# Patient Record
Sex: Male | Born: 1935 | Race: White | Hispanic: No | Smoking: Former smoker
Health system: Southern US, Community
[De-identification: ages and names within clinical notes are randomized; demographics above are authoritative.]

## PROBLEM LIST (undated history)

## (undated) DIAGNOSIS — W19XXXA Unspecified fall, initial encounter: Secondary | ICD-10-CM

## (undated) DIAGNOSIS — R0602 Shortness of breath: Secondary | ICD-10-CM

## (undated) DIAGNOSIS — E039 Hypothyroidism, unspecified: Secondary | ICD-10-CM

## (undated) DIAGNOSIS — I509 Heart failure, unspecified: Secondary | ICD-10-CM

## (undated) DIAGNOSIS — J449 Chronic obstructive pulmonary disease, unspecified: Secondary | ICD-10-CM

## (undated) DIAGNOSIS — J189 Pneumonia, unspecified organism: Secondary | ICD-10-CM

## (undated) DIAGNOSIS — I351 Nonrheumatic aortic (valve) insufficiency: Secondary | ICD-10-CM

## (undated) DIAGNOSIS — R6889 Other general symptoms and signs: Secondary | ICD-10-CM

## (undated) DIAGNOSIS — I4891 Unspecified atrial fibrillation: Secondary | ICD-10-CM

## (undated) DIAGNOSIS — I10 Essential (primary) hypertension: Secondary | ICD-10-CM

## (undated) DIAGNOSIS — R627 Adult failure to thrive: Secondary | ICD-10-CM

## (undated) DIAGNOSIS — F0390 Unspecified dementia without behavioral disturbance: Secondary | ICD-10-CM

## (undated) DIAGNOSIS — C189 Malignant neoplasm of colon, unspecified: Secondary | ICD-10-CM

## (undated) DIAGNOSIS — E785 Hyperlipidemia, unspecified: Secondary | ICD-10-CM

## (undated) DIAGNOSIS — I493 Ventricular premature depolarization: Secondary | ICD-10-CM

## (undated) HISTORY — DX: Nonrheumatic aortic (valve) insufficiency: I35.1

## (undated) HISTORY — DX: Ventricular premature depolarization: I49.3

## (undated) HISTORY — DX: Heart failure, unspecified: I50.9

## (undated) HISTORY — DX: Other general symptoms and signs: R68.89

## (undated) HISTORY — PX: COLON SURGERY: SHX602

---

## 1998-06-16 ENCOUNTER — Emergency Department (HOSPITAL_COMMUNITY): Admission: EM | Admit: 1998-06-16 | Discharge: 1998-06-16 | Payer: Self-pay | Admitting: Emergency Medicine

## 2001-05-08 ENCOUNTER — Other Ambulatory Visit: Admission: RE | Admit: 2001-05-08 | Discharge: 2001-05-08 | Payer: Self-pay | Admitting: Gastroenterology

## 2001-05-08 ENCOUNTER — Encounter (INDEPENDENT_AMBULATORY_CARE_PROVIDER_SITE_OTHER): Payer: Self-pay | Admitting: Specialist

## 2001-08-29 ENCOUNTER — Encounter: Payer: Self-pay | Admitting: Family Medicine

## 2001-08-29 ENCOUNTER — Encounter: Admission: RE | Admit: 2001-08-29 | Discharge: 2001-08-29 | Payer: Self-pay | Admitting: Family Medicine

## 2006-12-14 ENCOUNTER — Ambulatory Visit: Payer: Self-pay | Admitting: Thoracic Surgery

## 2006-12-20 ENCOUNTER — Ambulatory Visit (HOSPITAL_COMMUNITY): Admission: RE | Admit: 2006-12-20 | Discharge: 2006-12-20 | Payer: Self-pay | Admitting: Thoracic Surgery

## 2006-12-27 ENCOUNTER — Ambulatory Visit (HOSPITAL_COMMUNITY): Admission: RE | Admit: 2006-12-27 | Discharge: 2006-12-27 | Payer: Self-pay | Admitting: Thoracic Surgery

## 2007-01-03 ENCOUNTER — Ambulatory Visit: Payer: Self-pay | Admitting: Thoracic Surgery

## 2007-03-22 ENCOUNTER — Encounter: Admission: RE | Admit: 2007-03-22 | Discharge: 2007-03-22 | Payer: Self-pay | Admitting: Thoracic Surgery

## 2007-03-22 ENCOUNTER — Ambulatory Visit: Payer: Self-pay | Admitting: Thoracic Surgery

## 2007-03-31 ENCOUNTER — Ambulatory Visit: Payer: Self-pay | Admitting: Thoracic Surgery

## 2007-03-31 ENCOUNTER — Encounter: Payer: Self-pay | Admitting: Thoracic Surgery

## 2007-03-31 ENCOUNTER — Ambulatory Visit (HOSPITAL_COMMUNITY): Admission: RE | Admit: 2007-03-31 | Discharge: 2007-03-31 | Payer: Self-pay | Admitting: Thoracic Surgery

## 2007-04-04 ENCOUNTER — Ambulatory Visit: Payer: Self-pay | Admitting: Thoracic Surgery

## 2007-08-30 ENCOUNTER — Encounter: Admission: RE | Admit: 2007-08-30 | Discharge: 2007-08-30 | Payer: Self-pay | Admitting: Thoracic Surgery

## 2007-08-30 ENCOUNTER — Ambulatory Visit: Payer: Self-pay | Admitting: Thoracic Surgery

## 2007-12-05 ENCOUNTER — Ambulatory Visit: Payer: Self-pay | Admitting: Thoracic Surgery

## 2007-12-05 ENCOUNTER — Encounter: Admission: RE | Admit: 2007-12-05 | Discharge: 2007-12-05 | Payer: Self-pay | Admitting: Thoracic Surgery

## 2008-01-02 ENCOUNTER — Encounter: Admission: RE | Admit: 2008-01-02 | Discharge: 2008-01-02 | Payer: Self-pay | Admitting: Thoracic Surgery

## 2008-01-02 ENCOUNTER — Ambulatory Visit: Payer: Self-pay | Admitting: Thoracic Surgery

## 2008-01-08 ENCOUNTER — Ambulatory Visit (HOSPITAL_COMMUNITY): Admission: RE | Admit: 2008-01-08 | Discharge: 2008-01-08 | Payer: Self-pay | Admitting: Thoracic Surgery

## 2008-01-08 ENCOUNTER — Encounter: Payer: Self-pay | Admitting: Thoracic Surgery

## 2008-01-08 ENCOUNTER — Ambulatory Visit: Payer: Self-pay | Admitting: Thoracic Surgery

## 2008-01-10 ENCOUNTER — Ambulatory Visit: Payer: Self-pay | Admitting: Thoracic Surgery

## 2008-01-24 ENCOUNTER — Ambulatory Visit: Payer: Self-pay | Admitting: Pulmonary Disease

## 2008-01-24 DIAGNOSIS — R930 Abnormal findings on diagnostic imaging of skull and head, not elsewhere classified: Secondary | ICD-10-CM

## 2008-01-24 DIAGNOSIS — J309 Allergic rhinitis, unspecified: Secondary | ICD-10-CM | POA: Insufficient documentation

## 2008-01-24 DIAGNOSIS — E785 Hyperlipidemia, unspecified: Secondary | ICD-10-CM

## 2008-01-24 DIAGNOSIS — I110 Hypertensive heart disease with heart failure: Secondary | ICD-10-CM

## 2008-01-24 DIAGNOSIS — J479 Bronchiectasis, uncomplicated: Secondary | ICD-10-CM

## 2008-01-24 DIAGNOSIS — J45909 Unspecified asthma, uncomplicated: Secondary | ICD-10-CM | POA: Insufficient documentation

## 2008-01-24 DIAGNOSIS — Z85038 Personal history of other malignant neoplasm of large intestine: Secondary | ICD-10-CM | POA: Insufficient documentation

## 2008-01-25 ENCOUNTER — Telehealth (INDEPENDENT_AMBULATORY_CARE_PROVIDER_SITE_OTHER): Payer: Self-pay | Admitting: *Deleted

## 2008-02-01 ENCOUNTER — Ambulatory Visit: Payer: Self-pay | Admitting: Thoracic Surgery

## 2008-02-01 ENCOUNTER — Encounter: Admission: RE | Admit: 2008-02-01 | Discharge: 2008-02-01 | Payer: Self-pay | Admitting: Thoracic Surgery

## 2008-02-15 ENCOUNTER — Ambulatory Visit: Payer: Self-pay | Admitting: Pulmonary Disease

## 2008-04-16 ENCOUNTER — Ambulatory Visit: Payer: Self-pay | Admitting: Pulmonary Disease

## 2008-06-17 ENCOUNTER — Ambulatory Visit: Payer: Self-pay | Admitting: Pulmonary Disease

## 2008-06-17 DIAGNOSIS — R609 Edema, unspecified: Secondary | ICD-10-CM

## 2009-05-12 ENCOUNTER — Encounter (INDEPENDENT_AMBULATORY_CARE_PROVIDER_SITE_OTHER): Payer: Self-pay | Admitting: *Deleted

## 2009-06-05 ENCOUNTER — Ambulatory Visit: Payer: Self-pay | Admitting: Gastroenterology

## 2009-06-16 ENCOUNTER — Ambulatory Visit: Payer: Self-pay | Admitting: Gastroenterology

## 2010-03-16 ENCOUNTER — Ambulatory Visit: Payer: Self-pay | Admitting: Pulmonary Disease

## 2010-03-16 DIAGNOSIS — J189 Pneumonia, unspecified organism: Secondary | ICD-10-CM

## 2010-07-24 ENCOUNTER — Ambulatory Visit: Payer: Self-pay | Admitting: Cardiology

## 2010-07-24 ENCOUNTER — Ambulatory Visit (HOSPITAL_COMMUNITY): Admission: RE | Admit: 2010-07-24 | Discharge: 2010-07-24 | Payer: Self-pay | Admitting: Cardiology

## 2010-07-29 ENCOUNTER — Ambulatory Visit: Payer: Self-pay | Admitting: Cardiology

## 2010-12-01 ENCOUNTER — Ambulatory Visit: Payer: Self-pay | Admitting: Cardiology

## 2010-12-01 NOTE — Assessment & Plan Note (Signed)
Summary: rov/apc   Visit Type:  Follow-up Primary Provider/Referring Provider:  Rudi Heap in [facility]  CC:  Asthma.  Bronchiectasis.  Last seen 06/17/2008.  The patient says he had pneumonia recently and brought CD of CXR done at an Urgent Care. He has no complaints today.Marland Kitchen  History of Present Illness: I saw Todd [name] in follow up for his asthma, bronchiectasis, and rhinitis.  He was treated for pneumonia at the beginning of the month with a Zpak.  He had cough, wheeze, and chest congestion.  He denied fever or hemoptysis.  His symptoms have since improved.  He has not needed to use his ventolin much.  Chest xray from Mar 08, 2010 showed Rt upper lung infiltrate.    Current Medications (verified): 1)  Advair Diskus 500-50 Mcg/dose  Misc (Fluticasone-Salmeterol) .... Inhale 1 Puff Two Times A Day 2)  Ventolin Hfa 108 (90 Base) Mcg/act Aers (Albuterol Sulfate) .... 2 Puffs Every 6 Hours As Needed 3)  Rhinocort Aqua 32 Mcg/act  Susp (Budesonide) .... At Bedtime 4)  Singulair 10 Mg  Tabs (Montelukast Sodium) .... One By Mouth At Bedtime 5)  Atacand Hct 32-12.5 Mg  Tabs (Candesartan Cilexetil-Hctz) .... Once Daily 6)  Pravastatin Sodium 20 Mg Tabs (Pravastatin Sodium) .Marland Kitchen.. 1 By Mouth Daily 7)  Norvasc 5 Mg  Tabs (Amlodipine Besylate) .... Take 1 Tablet By Mouth Once A Day 8)  Lunesta 2 Mg  Tabs (Eszopiclone) .... For Use At Bedtime As Needed 9)  Mucinex 600 Mg  Tb12 (Guaifenesin) .... One By Mouth Two Times A Day As Needed  Allergies (verified): No Known Drug Allergies  Past History:  Past Medical History: Allergic Rhinitis Asthma Bronchiectasis Colon Cancer Hyperlipidemia Hypertension  Past Surgical History: Reviewed history from 01/24/2008 and no changes required. Colonoscopy  Vital Signs:  Patient profile:   77 year old male Height:      69 inches (175.26 cm) Weight:      260 pounds (118.18 kg) BMI:     38.53 O2 Sat:      96 % on Room air Temp:     97.9 degrees  F (36.61 degrees C) oral Pulse rate:   59 / minute BP sitting:   130 / 72  (left arm) Cuff size:   regular  Vitals Entered By: Michel Bickers CMA (Mar 16, 2010 11:56 AM)  O2 Sat at Rest %:  98 O2 Flow:  Room air CC: Asthma.  Bronchiectasis.  Last seen 06/17/2008.  The patient says he had pneumonia recently and brought CD of CXR done at an Urgent Care. He has no complaints today.   Physical Exam  General:  healthy appearing and thin.   Nose:  no sinus tenderness, clear discharge Mouth:  no oral lesion Neck:  no JVD.   Lungs:  decreased breath sounds, no wheezing, prolonged exhalation Heart:  regular rhythm and normal rate.   Extremities:  minimal ankle edema Cervical Nodes:  no significant adenopathy   Impression & Recommendations:  Problem # 1:  PNEUMONIA, ORGANISM UNSPECIFIED (ICD-486) He was recently treated for pneumonia.  He has improved clinically.  I will repeat his chest xray today.  If his infiltrate has cleared, then he can follow up with pulmonary as needed.  Problem # 2:  ASTHMA (ICD-493.90) This is stable.  He would like to have a nebulizer for emergencies.  Will arrange for him to get a home nebulizer.  Problem # 3:  BRONCHIECTASIS (ICD-494.0) This is stable.  Problem # 4:  ALLERGIC RHINITIS (ICD-477.9) This is stable.  Medications Added to Medication List This Visit: 1)  Ventolin Hfa 108 (90 Base) Mcg/act Aers (Albuterol sulfate) .... 2 puffs every 6 hours as needed 2)  Pravastatin Sodium 20 Mg Tabs (Pravastatin sodium) .Marland Kitchen.. 1 by mouth daily 3)  Albuterol Sulfate (2.5 Mg/30ml) 0.083% Nebu (Albuterol sulfate) .... One vial nebulized up to four times per day as needed  Complete Medication List: 1)  Advair Diskus 500-50 Mcg/dose Misc (Fluticasone-salmeterol) .... Inhale 1 puff two times a day 2)  Ventolin Hfa 108 (90 Base) Mcg/act Aers (Albuterol sulfate) .... 2 puffs every 6 hours as needed 3)  Rhinocort Aqua 32 Mcg/act Susp (Budesonide) .... At bedtime 4)   Singulair 10 Mg Tabs (Montelukast sodium) .... One by mouth at bedtime 5)  Atacand Hct 32-12.5 Mg Tabs (Candesartan cilexetil-hctz) .... Once daily 6)  Pravastatin Sodium 20 Mg Tabs (Pravastatin sodium) .Marland Kitchen.. 1 by mouth daily 7)  Norvasc 5 Mg Tabs (Amlodipine besylate) .... Take 1 tablet by mouth once a day 8)  Lunesta 2 Mg Tabs (Eszopiclone) .... For use at bedtime as needed 9)  Mucinex 600 Mg Tb12 (Guaifenesin) .... One by mouth two times a day as needed 10)  Albuterol Sulfate (2.5 Mg/10ml) 0.083% Nebu (Albuterol sulfate) .... One vial nebulized up to four times per day as needed  Other Orders: Est. Patient Level III (16109) T-2 View CXR (71020TC) DME Referral (DME) Prescription Created Electronically [phone])  Patient Instructions: 1)  Chest xray today 2)  Will set up nebulizer at home 3)  Follow up as needed   Prescriptions: ALBUTEROL SULFATE (2.5 MG/3ML) 0.083% NEBU (ALBUTEROL SULFATE) one vial nebulized up to four times per day as needed  #120 x 3   Entered and Authorized by:   Coralyn Helling MD   Signed by:   Coralyn Helling MD on 03/16/2010   Method used:   Electronically to        [facility]  [address] [phone][facility] (retail)       [address]       [place], [place]  [address]       Ph: [phone]       Fax: [phone]   RxID:   [id number]    Immunization History:  Influenza Immunization History:    Influenza:  historical (07/02/2009)  Pneumovax Immunization History:    Pneumovax:  historical (11/01/2006)

## 2011-01-20 ENCOUNTER — Emergency Department (HOSPITAL_COMMUNITY): Payer: Medicare Other

## 2011-01-20 ENCOUNTER — Inpatient Hospital Stay (HOSPITAL_COMMUNITY)
Admission: EM | Admit: 2011-01-20 | Discharge: 2011-01-23 | DRG: 194 | Disposition: A | Discharge status: Home Health | Payer: Medicare Other | Attending: Internal Medicine | Admitting: Internal Medicine

## 2011-01-20 DIAGNOSIS — D72829 Elevated white blood cell count, unspecified: Secondary | ICD-10-CM | POA: Diagnosis not present

## 2011-01-20 DIAGNOSIS — I10 Essential (primary) hypertension: Secondary | ICD-10-CM | POA: Diagnosis present

## 2011-01-20 DIAGNOSIS — J45909 Unspecified asthma, uncomplicated: Secondary | ICD-10-CM | POA: Diagnosis present

## 2011-01-20 DIAGNOSIS — Z85038 Personal history of other malignant neoplasm of large intestine: Secondary | ICD-10-CM

## 2011-01-20 DIAGNOSIS — I4891 Unspecified atrial fibrillation: Secondary | ICD-10-CM | POA: Diagnosis present

## 2011-01-20 DIAGNOSIS — D649 Anemia, unspecified: Secondary | ICD-10-CM | POA: Diagnosis present

## 2011-01-20 DIAGNOSIS — M810 Age-related osteoporosis without current pathological fracture: Secondary | ICD-10-CM | POA: Diagnosis present

## 2011-01-20 DIAGNOSIS — E119 Type 2 diabetes mellitus without complications: Secondary | ICD-10-CM | POA: Diagnosis present

## 2011-01-20 DIAGNOSIS — I5030 Unspecified diastolic (congestive) heart failure: Secondary | ICD-10-CM | POA: Diagnosis present

## 2011-01-20 DIAGNOSIS — T380X5A Adverse effect of glucocorticoids and synthetic analogues, initial encounter: Secondary | ICD-10-CM | POA: Diagnosis not present

## 2011-01-20 DIAGNOSIS — N289 Disorder of kidney and ureter, unspecified: Secondary | ICD-10-CM | POA: Diagnosis present

## 2011-01-20 DIAGNOSIS — J189 Pneumonia, unspecified organism: Principal | ICD-10-CM | POA: Diagnosis present

## 2011-01-20 DIAGNOSIS — I509 Heart failure, unspecified: Secondary | ICD-10-CM | POA: Diagnosis present

## 2011-01-20 DIAGNOSIS — E785 Hyperlipidemia, unspecified: Secondary | ICD-10-CM | POA: Diagnosis present

## 2011-01-20 LAB — POCT CARDIAC MARKERS
CKMB, poc: 2.3 ng/mL (ref 1.0–8.0)
Myoglobin, poc: 306 ng/mL (ref 12–200)
Troponin i, poc: <0.05 ng/mL (ref 0.00–0.09)

## 2011-01-20 LAB — CK TOTAL AND CKMB (NOT AT ARMC): CK, MB: 4.2 ng/mL — ABNORMAL HIGH (ref 0.3–4.0)

## 2011-01-20 LAB — BLOOD GAS, ARTERIAL
Acid-Base Excess: 0.9 mmol/L (ref 0.0–2.0)
Drawn by: 30996
O2 Content: 2 L/min
O2 Saturation: 96.5 %
Patient temperature: 98.6
TCO2: 23.1 mmol/L (ref 0–100)
pCO2 arterial: 36.3 mmHg (ref 35.0–45.0)

## 2011-01-20 LAB — DIFFERENTIAL
Basophils Absolute: 0.1 10*3/uL (ref 0.0–0.1)
Basophils Relative: 1 % (ref 0–1)
Eosinophils Relative: 4 % (ref 0–5)
Lymphocytes Relative: 10 % — ABNORMAL LOW (ref 12–46)
Monocytes Absolute: 1.4 10*3/uL — ABNORMAL HIGH (ref 0.1–1.0)
Neutro Abs: 5.7 10*3/uL (ref 1.7–7.7)

## 2011-01-20 LAB — CBC
HCT: 26.3 % — ABNORMAL LOW (ref 39.0–52.0)
MCHC: 32.3 g/dL (ref 30.0–36.0)
RDW: 13.9 % (ref 11.5–15.5)
WBC: 8.3 10*3/uL (ref 4.0–10.5)

## 2011-01-20 LAB — TSH: TSH: 2.44 u[IU]/mL (ref 0.350–4.500)

## 2011-01-20 LAB — GLUCOSE, CAPILLARY: Glucose-Capillary: 96 mg/dL (ref 70–99)

## 2011-01-20 LAB — BASIC METABOLIC PANEL
Calcium: 8.6 mg/dL (ref 8.4–10.5)
GFR calc Af Amer: >60 mL/min (ref >60)
GFR calc non Af Amer: >60 mL/min (ref >60)
Glucose, Bld: 100 mg/dL — ABNORMAL HIGH (ref 70–99)
Potassium: 3.7 mEq/L (ref 3.5–5.1)
Sodium: 134 mEq/L — ABNORMAL LOW (ref 135–145)

## 2011-01-20 LAB — T3 UPTAKE: T3 Uptake Ratio: 51 % — ABNORMAL HIGH (ref 22.5–37.0)

## 2011-01-20 LAB — TROPONIN I: Troponin I: 0.02 ng/mL (ref 0.00–0.06)

## 2011-01-20 LAB — BRAIN NATRIURETIC PEPTIDE: Pro B Natriuretic peptide (BNP): 232 pg/mL — ABNORMAL HIGH (ref 0.0–100.0)

## 2011-01-20 NOTE — H&P (Signed)
NAME:  TAURUS, ALAMO NO.:  [id number]  MEDICAL RECORD NO.:  [id number]           PATIENT TYPE:  E  LOCATION:  WLED                         FACILITY[facility]  PHYSICIAN:  Talmage Nap, MD  DATE OF BIRTH:  [birth year]  DATE OF ADMISSION:  01/20/2011 DATE OF DISCHARGE:                             HISTORY & PHYSICAL   PRIMARY CARE PHYSICIAN:  Newman Nip, M.D., family medicine.  History obtainable from patient, patient's spouse and son.  CHIEF COMPLAINT:  Shortness of breath, poor appetite, difficulty in ambulation of about 2 weeks duration.  HISTORY OF PRESENT ILLNESS:  Patient is an 77 year old Caucasian male with history of asthma; atrial fibrillation on Pradaxa and hypertension as well as diabetes mellitus, being managed on diet, was initially seen by his primary care physician about 2 weeks ago with complaints of shortness of breath and subsequently treated for pneumonia with Levaquin for about 10 days; however, patient was said to be getting progressively worse and this was said to have been evident by a follow up chest x-ray-no improvement, patient was also said to be getting progressively lethargic, not able to eat and generalized weakness.  Patient was  said to be very sleepy and was also complaining of increasing shortness of breath.  He had swelling of the lower extremity before and this swelling of the lower extremity was getting worse. There was no history of cough.  There was no history of chest pain. There was no nausea or vomiting.  He had subjective feeling of fever.No pnd or orthopnea Patient symptoms were said to be getting progressively worse hence he was brought to the emergency room to be evaluated.  PAST MEDICAL HISTORY:  Positive for: 1. Recently treated pneumonia. 2. Asthma. 3. Hyperlipidemia. 4. Hypertension. 5. History of colon CA. 6. Hearing impaired. 7. BPH. 8. Osteoporosis. 9. History of colon  polyps. 10.Non-insulin-dependent diabetes mellitus. 11.History of lung nodules.  PAST SURGICAL HISTORY: 1. Colonoscopy 5 years ago-findings unknown 2. History of colon CA, status post exploratory laparotomy with     resection.  PREADMISSION MEDICATIONS:  Include: 1. Atacand HCT 2/12.5 one p.o. daily. 2. Allegra 600 mg one p.o. b.i.d. 3. Advair Diskus 500/50 one puff b.i.d. 4. Albuterol MDI on a p.r.n. basis. 5. Calcium 1200 mg plus D one p.o. daily. 6. Centrum Silver one p.o. daily. 7. Rhinocort aqua one daily. 8. Aspirin 81 mg p.o. daily. 9. Norvasc 5 mg p.o. daily. 10.Lunesta 2 mg p.o. q.h.s. 11.Lovastatin 20 mg p.o. q.h.s. 12.Pradaxa 150 mg p.o. b.i.d.  ALLERGIES:  Questionable LEVAQUIN.  SOCIAL HISTORY:  Negative for alcohol or tobacco use, presently lives at home with his spouse and is retired.  FAMILY HISTORY:  York Spaniel to be positive for hypertension.  REVIEW OF SYSTEMS:  Patient denies any history of headaches.  No blurred vision.  No nausea or vomiting.  No fever.  No chills.  No rigor. Complained of progressive shortness of breath with easy fatigability and weakness.  He also complained about excessive somnolence.  He denies any cough.  He denies any abdominal discomfort.  No diarrhea or hematochezia.  No dysuria or hematuria.  Has swelling of the lower extremities.No pnd or orthopnea.No intolerance to heat or cold and no neuropsychiatric disorder.  PHYSICAL EXAMINATION:  GENERAL:  Elderly man, very sleepy, not in any  respiratory distress with suboptimal hydration. PRESENT VITAL SIGNS:  Blood pressure is 117/62, pulse 60, respiratory rate 18, temperature is 98.2. HEENT:  Pallor, but pupils are reactive to light and extraocular muscles are intact. NECK:  He has no jugular venous distention.  No carotid bruit.  No lymphadenopathy. CHEST:  Showed crackles in mid and lower zones of the lungs bilaterally. HEART:  Heart sounds are irregular.  No murmur. ABDOMEN:   Soft, nontender.  Liver, spleen, kidney not palpable.  Bowel sounds are positive. EXTREMITIES:  Showed +2 pedal edema. NEUROLOGIC EXAMINATION:  Nonfocal. MUSCULOSKELETAL SYSTEM:  Shows arthritic changes in the knees and in the feet. NEUROPSYCHIATRIC EVALUATION:  Unremarkable. SKIN:  Showed decreased turgor.  LABORATORY DATA:  Initial chemistry shows sodium of 134, potassium of 3.7, chloride of 101 with the bicarb of 24, glucose is 100, BUN is 25, creatinine is 1.03.  Hematological indices showed WBC of 8.3, hemoglobin of 8.5, hematocrit of 26.3, MCV of 84.2, the platelet count of 452,000, neutrophils 68%, absolute granulocyte count is 5.7.  First set of cardiac marker; troponin-I less than 0.05.  Fecal occult blood test negative.  EKG done on the patient showed atrial fibrillation with a rate of 64, left axis deviation with Q-waves.  Chest x-ray showed multiple opacifications on both lung fields, worse on the right than on the left with prominence at the apices of the lungs.  IMPRESSION: 1. Bilateral pneumonia with hypoxia (questionable BOOP that is     bronchiolitis obliterans organizing pneumonia). 2. Anemia. 3. Atrial fibrillation. 4. Asthma. 5. Bilateral pedal edema, questionable cor pulmonale. 6. Excessive somnolence to rule out thyroid disorder. 7. Hypertension. 8. Hyperlipidemia. 9. Diabetes mellitus. 10.Osteoporosis. 11.Benign prostatic hyperplasia. 12.History of colon cancer, status post resection.  PLAN:  Plan is to admit patient to telemetry.  Patient will be on Zosyn 3.375 g IV q.8h. vancomycin 1 g IV stat and dosing to be done by pharmacy.  He will also be on O2 via nasal cannula 3 liters per minute. Patient will be on albuterol and Atrovent nebs q.4h.  He will also be on Solu-Medrol 60 mg IV q.8h.  For his diabetes mellitus, patient will be on Accu-Cheks t.i.d. with a.c. h.s. and regular insulin sliding scale (moderate scale) and for questionable cor  pulmonale, patient will be on Lasix 40 mg IV q.12h. and he will also be on Atacand 325 mg p.o. daily. Blood pressure will be controlled with Norvasc 5 mg p.o. daily.  For hyperlipidemia, patient will be restarted on pravastatin 20 mg p.o. at bedtime.  For atrial fibrillation, since patient is fecal occult blood test negative, he will be restarted on Pradaxa 150 mg p.o. b.i.d.  For anemia, he will be typed and crossed, transfused 1 unit of packed RBCs. He will be on Protonix 40 mg IV q.24h. for GI prophylaxis and Lovenox 40 mg subcutaneously q.24h. for DVT prophylaxis.  Further labs to be ordered on this patient will include cardiac enzymes q.6h. x3.  Blood culture x2 before starting IV antibiotics.  BNP stat, hemoglobin A1c, arterial blood gas stat, D-dimer stat and since the patient has complained of hypersomnolemce, patient will be ruled out for thyroid disorder and therefore we order TSH, T3 and T4 and the history of colon ca, CEA level will also be ordered.  Other imaging studies  to be ordered on the patient will include 2-D echo.  Patient will be followed and evaluated on daily basis.     Talmage Nap, MD     CN/MEDQ  D:  01/20/2011  T:  01/20/2011  Job:  [id number]  Electronically Signed by Talmage Nap  on 01/20/2011 07:47:44 PM

## 2011-01-21 ENCOUNTER — Inpatient Hospital Stay (HOSPITAL_COMMUNITY): Payer: Medicare Other

## 2011-01-21 DIAGNOSIS — I059 Rheumatic mitral valve disease, unspecified: Secondary | ICD-10-CM

## 2011-01-21 LAB — DIFFERENTIAL
Basophils Absolute: 0 10*3/uL (ref 0.0–0.1)
Eosinophils Relative: 0 % (ref 0–5)
Lymphs Abs: 0.3 10*3/uL — ABNORMAL LOW (ref 0.7–4.0)
Monocytes Relative: 3 % (ref 3–12)
Neutro Abs: 4.5 10*3/uL (ref 1.7–7.7)

## 2011-01-21 LAB — COMPREHENSIVE METABOLIC PANEL
AST: 29 U/L (ref 0–37)
Albumin: 2.4 g/dL — ABNORMAL LOW (ref 3.5–5.2)
Alkaline Phosphatase: 89 U/L (ref 39–117)
Chloride: 101 mEq/L (ref 96–112)
GFR calc Af Amer: >60 mL/min (ref >60)
Potassium: 4 mEq/L (ref 3.5–5.1)
Total Bilirubin: 0.5 mg/dL (ref 0.3–1.2)

## 2011-01-21 LAB — CARDIAC PANEL(CRET KIN+CKTOT+MB+TROPI)
CK, MB: 3.9 ng/mL (ref 0.3–4.0)
CK, MB: 4.5 ng/mL — ABNORMAL HIGH (ref 0.3–4.0)
Troponin I: 0.02 ng/mL (ref 0.00–0.06)
Troponin I: 0.01 ng/mL (ref 0.00–0.06)

## 2011-01-21 LAB — IRON AND TIBC: UIBC: 218 ug/dL

## 2011-01-21 LAB — CBC
Hemoglobin: 9.1 g/dL — ABNORMAL LOW (ref 13.0–17.0)
MCH: 27.6 pg (ref 26.0–34.0)
MCV: 84.2 fL (ref 78.0–100.0)
RBC: 3.3 MIL/uL — ABNORMAL LOW (ref 4.22–5.81)

## 2011-01-21 LAB — GLUCOSE, CAPILLARY
Glucose-Capillary: 191 mg/dL — ABNORMAL HIGH (ref 70–99)
Glucose-Capillary: 219 mg/dL — ABNORMAL HIGH (ref 70–99)

## 2011-01-21 LAB — FOLATE: Folate: >20 ng/mL

## 2011-01-21 LAB — HEMOGLOBIN A1C: Hgb A1c MFr Bld: 6.6 % — ABNORMAL HIGH (ref <5.7)

## 2011-01-21 MED ORDER — IOHEXOL 300 MG/ML  SOLN
100.0000 mL | Freq: Once | INTRAMUSCULAR | Status: AC | PRN
  Administered 2011-01-21: 100 mL via INTRAVENOUS

## 2011-01-22 LAB — GLUCOSE, CAPILLARY
Glucose-Capillary: 176 mg/dL — ABNORMAL HIGH (ref 70–99)
Glucose-Capillary: 122 mg/dL — ABNORMAL HIGH (ref 70–99)
Glucose-Capillary: 189 mg/dL — ABNORMAL HIGH (ref 70–99)

## 2011-01-22 LAB — BASIC METABOLIC PANEL
CO2: 27 mEq/L (ref 19–32)
Calcium: 9 mg/dL (ref 8.4–10.5)
GFR calc Af Amer: 49 mL/min — ABNORMAL LOW (ref >60)
GFR calc non Af Amer: 41 mL/min — ABNORMAL LOW (ref >60)
Sodium: 142 mEq/L (ref 135–145)

## 2011-01-23 LAB — BASIC METABOLIC PANEL
BUN: 42 mg/dL — ABNORMAL HIGH (ref 6–23)
CO2: 30 mEq/L (ref 19–32)
Calcium: 8.9 mg/dL (ref 8.4–10.5)
Creatinine, Ser: 1.27 mg/dL (ref 0.4–1.5)
Glucose, Bld: 167 mg/dL — ABNORMAL HIGH (ref 70–99)

## 2011-01-23 LAB — CBC
HCT: 28.9 % — ABNORMAL LOW (ref 39.0–52.0)
Hemoglobin: 9.2 g/dL — ABNORMAL LOW (ref 13.0–17.0)
MCH: 27.3 pg (ref 26.0–34.0)
MCHC: 31.8 g/dL (ref 30.0–36.0)
MCV: 85.8 fL (ref 78.0–100.0)

## 2011-01-23 LAB — GLUCOSE, CAPILLARY

## 2011-01-24 LAB — CROSSMATCH
ABO/RH(D): O POS
Antibody Screen: NEGATIVE
Unit division: 0

## 2011-01-26 LAB — CULTURE, BLOOD (ROUTINE X 2)
Culture  Setup Time: 201203212310
Culture: NO GROWTH

## 2011-01-28 NOTE — Discharge Summary (Signed)
NAME:  Todd [name], Todd [name] NO.:  [id number]  MEDICAL RECORD NO.:  [id number]           PATIENT TYPE:  I  LOCATION:  1423                         FACILITY:  [facility]  PHYSICIAN:  Kela Millin, M.D.DATE OF BIRTH:  September 28, 1924  DATE OF ADMISSION:  01/20/2011 DATE OF DISCHARGE:  01/23/2011                        DISCHARGE SUMMARY - REFERRING   DISCHARGE DIAGNOSES: 1. Bilateral upper lobe pneumonia. 2. Diastolic heart failure. 3. Hypertension. 4. Hyperlipidemia. 5. History of asthma. 6. History of colon cancer. 7. History of diabetes mellitus - not on medications, follow up     outpatient. 8. History of osteoporosis. 9. History of colon polyps. 10.History of a atrial of fibrillation. 11.History of lung nodules/lung scarring.  PROCEDURES AND STUDIES: 1. Chest x-ray on January 20, 2011 - bilateral upper lobe pneumonia -     new finding.  Increased markings at the lung bases, most likely     chronic. 2. CT angiogram of chest on March, 22, 2012 - diffuse ground glass     attenuation scattered throughout both lungs.  This is nonspecific     but likely represents infection.  There may be some superimposed     edema.  Hemorrhage is considered less likely.  No evidence of     pulmonary embolus.  Chronic scarring changes at the lung bases,     worse on the right.  Small bilateral pleural effusions on the left.     Endplate degenerative changes in the thoracic spine with remote     compression fractures.  BRIEF HISTORY:  The patient is an 77 year old white male with past medical history significant for atrial fibrillation, on Pradaxa and hypertension as well as diabetes mellitus - diet controlled, who had initially presented to his primary care physician at about 2 weeks prior to this presentation with shortness of breath and was treated with pneumonia for about 10 days with Levaquin.  The patient continued to get worse and follow up chest x-ray showed no improvement and  he was getting progressively lethargic and not able to eat.  It was also reported that he had had some lower extremity edema previously, but this was worsening.  The patient denied chest pain, nausea or vomiting also denied fevers.  Because of his worsening symptoms, he was brought to the ED for further evaluation and management.  HOSPITAL COURSE: 1. Bilateral upper lobe pneumonia - upon admission, the patient was     started on broad-spectrum antibiotics with vancomycin and Zosyn.  A     CT angiogram was done to further evaluate and it showed diffuse     ground glass attenuation scattered throughout both lungs and per     radiologist this was nonspecific, but likely represented infection     and it was stated that there might be some superimposed edema and     the hemorrhage was considered less likely.  There was no evidence     for pulmonary embolus.  The patient was also placed on IV Solu-     Medrol on admission.  The patient responded well to this     intervention and his symptoms have  improved significantly.  He is     oxygenating well on room air at this time and he denies any cough.     He was evaluated by physical therapy and home health PT     recommended, the patient is now ambulating well and tolerating     p.o.'s well.  He has remained afebrile.  As he improved, his     antibiotics were de-escalated and also IV steroids were changed to     prednisone and the patient has continued to do well.  He will be     discharged on oral Avelox at this time and a prednisone taper and     he is to follow up outpatient with his primary care physician. 2. Diastolic congestive heart failure - upon admission, the patient     reported leg swelling and had a brain natriuretic peptide, which     was also mildly elevated.  A 2-D echocardiogram was done and showed     an ejection fraction of 65% to 70% with normal wall motion.  The     patient was diuresed with IV Lasix and his lower extremity  swelling     has resolved.  His shortness of breath also improved as discussed     above.  The impression was that the patient has diastolic heart     failure and he will be discharged on the oral Lasix and he is to     follow up outpatient.  He had a TSH done, which was within normal     limits and his point-of-care markers were negative.  It was noted     on admission that he was on Atacand, but since he was started on     Lasix for better diuresis, the HCTZ was discontinued.  The patient     was then discharged on candesartan along with Lasix as already     mentioned.  He is to follow up outpatient.  The impression was that     his diastolic heart failure was likely secondary to his atrial     fibrillation. 3. Atrial fibrillation - the patient has history of atrial     fibrillation and his rate remained controlled during this hospital     stay, on beta blockers.  He was maintained on his Pradaxa and     aspirin during this hospital stay and he is to continue them upon     discharge. 4. Anemia - the patient was noted to be anemic on admission with a     hemoglobin of 8.5.  He was transfused 1 unit of packed red blood     cells and his follow up hemoglobin improved to 9.2.  He had stool     guaiacs done during this hospital stay and they came back negative.     The patient is to follow up outpatient with his primary care     physician for monitoring of his hemoglobins and possible referral     to GI outpatient as appropriate. 5. History of diabetes mellitus - diet controlled - his Accu-Cheks     were monitored and he was covered with sliding scale insulin during     this hospital stay.  He had a hemoglobin A1c done and it came back     at 6.6.  He is to follow up with his primary care physician for     continued monitoring and further treatment as clinically     appropriate.  6. History of asthma - he was maintained on his Advair and a     bronchodilators during this hospital stay and  is to continue them     upon discharge. 7. Hypertension - he is to continue his outpatient medications as     previously except for the HCTZ, which was discontinued as mentioned     above and the patient now on candesartan. 8. His other chronic medical conditions remained stable during this     hospital stay and he was maintained on this as outpatient     medications except as indicated above.  DISCHARGE MEDICATIONS: 1. Avelox 400 mg 1 p.o. daily. 2. Candesartan 32 mg 1 p.o. daily. 3. Furosemide 40 mg p.o. daily. 4. KCl 10 mEq 2 tablets p.o. daily. 5. Prilosec 40 mg p.o. daily. 6. Prednisone taper as directed. 7. Advair Diskus 1 puff b.i.d. 8. Albuterol inhaler 1 puff q.4 hours p.r.n. 9. Allegra 60 mg p.o. b.i.d. 10.Calcium 1200 mg p.o. daily. 11.Excedrin 1 p.o. daily p.r.n. as previously. 12.Lunesta 2 mg p.o. q.h.s. p.r.n. 13.Multivitamins 1 p.o. daily. 14.Norvasc 5 mg p.o. daily. 15.Pradaxa 150 mg p.o. b.i.d. 16.Pravachol 200 mg p.o. q.h.s. 17.Rhinocort nasal spray 1 spray daily p.r.n.  DISCONTINUED MEDICATIONS:  Atacand as above.  FOLLOWUP CARE:  Dr. Gilmore Laroche in 1 to 2 weeks, the patient is to call for appointment.  DISCHARGE CONDITION:  Improved/stable.     Kela Millin, M.D.     ACV/MEDQ  D:  01/23/2011  T:  01/23/2011  Job:  [id number]  cc:   Gilmore Laroche, MD  Electronically Signed by Donnalee Curry M.D. on 01/28/2011 10:40:53 AM

## 2011-03-16 NOTE — Letter (Signed)
February 01, 2008   Coralyn Helling, MD  Pulmonary Medicine  [address]  [place], [place]  [address]   Re:  DMARIUS, REEDER                 DOB:  02/06/1924   Dear Laurier Nancy:   I appreciate you taking care of Mr. Schowalter.  He is feeling better since  being on antibiotics and steroids.  His chest x-ray shows improved  aeration of the right lung and chronic persistent opacities, but I think  this is all chronic inflammatory disease.  As you know, I bronchoscoped  him twice and did not find any evidence of any cancer, so I think I will  let you follow him for the long term.  I think his major problems are  all inflammatory.  I appreciate the opportunity of seeing Mr. Nanda.   Sincerely,   Ines Bloomer, M.D.  Electronically Signed   DPB/MEDQ  D:  02/01/2008  T:  02/01/2008  Job:  [id number]

## 2011-03-16 NOTE — Op Note (Signed)
NAME:  Todd [name], Todd [name] NO.:  [id number]   MEDICAL RECORD NO.:  [id number]          PATIENT TYPE:  AMB   LOCATION:  SDS                          FACILITY:  [facility]   PHYSICIAN:  Ines Bloomer, M.D. DATE OF BIRTH:  1924/09/08   DATE OF PROCEDURE:  01/08/2008  DATE OF DISCHARGE:                               OPERATIVE REPORT   PREOPERATIVE DIAGNOSIS:  Bilateral pulmonary infiltrates.   POSTOPERATIVE DIAGNOSIS:  Bilateral pulmonary infiltrates.   OPERATION PERFORMED:  Video bronchoscopy.   SURGEON:  Dr. Patricia Nettle. Burney.   ANESTHESIA:  Local anesthesia with Cetacaine, Xylocaine and IV sedation.   After local anesthesia with Cetacaine, Xylocaine and IV sedation, the  video bronchoscope was passed through the mouth.  The cords were in the  midline.  The distal trachea was normal.  The left mainstem, left upper  lobe and left lower lobe had no endobronchial lesions.  Pictures were  taken of this but the patient had very purulent bronchitis.  The  purulent material was suctioned out and sent for cytology using  cultures.  In like manner on the right side, we suctioned out the  purulent material and took pictures of the right lower lobe.  The  patient had both left and right lower lobe infiltrates and brushings  were taken from the left lower lobe and the right lower lobe under  fluoro guidance.  The area was irrigated copiously and the video  bronchoscope was removed.  The patient tolerated the procedure well, was  returned to the recovery room in stable condition.      Ines Bloomer, M.D.  Electronically Signed     DPB/MEDQ  D:  01/08/2008  T:  01/08/2008  Job:  [id number]

## 2011-03-16 NOTE — Letter (Signed)
April 04, 2007   Ernestina Penna, M.D.   Re:  Todd, [name]               DOB:  [birth year]   Dear [name]:   I saw Todd [name] back again after his bronchoscopy.  We were able to do  that with heavy sedation.  His washings, brushings and transbronchial  biopsies all show an inflammation, as well as acute-on-chronic  inflammation with no evidence of cancer.  The last CT scan did show  these irregular nodules getting larger in the left lower lobe.  Blood  pressure was 145/80, pulse 100, respirations 18 and sats were 96%.  Since we did not get a diagnosis of cancer and he was unable to hold  still for his needle biopsies, our options are limited.  I still think  he probably may have a cancer given that the lesions have changed.   I planto follow the lesions right now and if they increases in size,  then we will have to do an open lung biopsy.  We will see him again in 3  months with another CT scan.   Sincerely,   Ines Bloomer, M.D.  Electronically Signed   DPB/MEDQ  D:  04/04/2007  T:  04/05/2007  Job:  [id number]   cc:   Ernestina Penna, M.D.

## 2011-03-16 NOTE — Assessment & Plan Note (Signed)
OFFICE VISIT   KALEE, MCCLENATHAN  DOB:  [birth year]                                        January 02, 2008  CHART #:  [id number]   The patient returned today.  I saw him a month ago with just a chest x-  ray and which he was having some mild hemoptysis and had been given  prednisone.  I scheduled him back again for another CT scan and his  hemoptysis is still present.  His CT scan showed evidence that the left  lower lobe was the same, but there was progression of disease in the  right lower lobe that appeared to be possibly inflammatory in nature.  I  am not sure why he continues to have the low grade hemoptysis, but  obviously he has had progression of whatever process is going on.  I had  a long discussion with him and his family about what to do and I think  the best thing to do is to repeat his bronchoscopy which I have  scheduled for next week.  If that does not give [place] a diagnosis, then I  may refer him to one of our pulmonologists for his evaluation.  He has  no history of reflux and continues with progressively ongoing  hemoptysis.   Ines Bloomer, M.D.  Electronically Signed   DPB/MEDQ  D:  01/02/2008  T:  01/03/2008  Job:  [id number]

## 2011-03-16 NOTE — Op Note (Signed)
NAME:  Todd [name], Todd [name] NO.:  [id number]   MEDICAL RECORD NO.:  [id number]          PATIENT TYPE:  AMB   LOCATION:  SDS                          FACILITY:  [facility]   PHYSICIAN:  Ines Bloomer, M.D. DATE OF BIRTH:  July 06, 1924   DATE OF PROCEDURE:  DATE OF DISCHARGE:                               OPERATIVE REPORT   PREOPERATIVE DIAGNOSIS:  Enlarging left lower lobe nodules.   POSTOPERATIVE DIAGNOSIS:  Enlarging left lower lobe nodules.   OPERATION PERFORMED:  Video bronchoscopy.   SURGEON:  Dr. Patricia Nettle. Burney.   ANESTHESIA:  Local anesthesia with Cetacaine, lidocaine and IV sedation.   After local anesthesia with Cetacaine, Xylocaine and IV sedation, the  video bronchoscope was passed through the through the mouth, the cords  were in the midline, the carina was in the midline.  The patient had  marked purulent material. The right upper lobe, right middle lobe and  right lower lobe orifices were taken.  The left mainstem, left upper  lobe and left lower lobe orifices were taken. There were no  endobronchial lesions. Under fluoroscopic guidance ,we did brushings and  biopsies in the basilar segments of the left lower lobe where multiple  inflammatory nodules were. The video bronchoscope was removed.  The  patient returned to the recovery room in stable condition.      Ines Bloomer, M.D.  Electronically Signed     DPB/MEDQ  D:  03/31/2007  T:  03/31/2007  Job:  [id number]

## 2011-03-16 NOTE — Letter (Signed)
January 10, 2008   Coralyn Helling, MD  [address]  [name], [place] [address]   Re:  Todd [name], Todd [name]               DOB:  [birth year]   Dear Laurier Nancy:   I saw Ms. Vanhook in the office today after bronchoscopy.  I have been  following this for over a year for a left lower lobe process.  Previous  biopsies were negative.  We attempted to do a needle biopsy, and that  was negative.  I followed him with serial CT scans with no changes until  recently, when he started developing inflammation on the right lower  lobe.  I repeated his bronchoscopy with purulent bronchitis on both  sides.  I started him on Avelox for this, but I think I would like to  refer him to you for longterm treatment of this.  I do not think that he  has a cancer.  If he does have a cancer, he is not an operative  candidate given his age of 29.  I will see him back again in 3 weeks  with a chest x-ray.   Ines Bloomer, M.D.  Electronically Signed   DPB/MEDQ  D:  01/10/2008  T:  01/10/2008  Job:  [id number]

## 2011-03-16 NOTE — Letter (Signed)
August 30, 2007   Ernestina Penna, M.D.  [address] [place], [place] [address]   Re:  KENICHI, CASSADA               DOB:  Nov 12, 1923   Dear [name],   I saw the patient back today for followup of his CT scan.  Unfortunately, our CT scan system was down so we did not have the old  scan to compare with this one and given his multiple areas in the left  lower lobe we will need that before making a final determination.  His  blood pressure was 151/71, pulse 50, respirations 20, sat were 91%.  Lungs were clear to auscultation and percussion.  He is doing well  overall and had no real major problems since we saw him last.  I will  schedule him for another CT scan in 3 months and will give him a call  when we get the final report on this CT scan, see if we need to do any  type of needle biopsy.   Ines Bloomer, M.D.  Electronically Signed   DPB/MEDQ  D:  08/30/2007  T:  08/31/2007  Job:  [id number]

## 2011-03-16 NOTE — Assessment & Plan Note (Signed)
OFFICE VISIT   JEN, EPPINGER  DOB:  04/28/1924                                        December 05, 2007  CHART #:  [id number]   Mr. Zulauf came today having some mild hemoptysis but apparently was seen  by his medical doctor who started him on prednisone and this has helped  him somewhat.  He needs to have another CT scan done recently. His chest  x-ray really shows no changes.  I will schedule him to see CT in one  month.  If his hemoptysis gets worse, then we will go ahead and do a  bronchoscopy.  His blood pressure was 160/87, pulse 100, respirations  18, sats were 90%.   Ines Bloomer, M.D.  Electronically Signed   DPB/MEDQ  D:  12/05/2007  T:  12/06/2007  Job:  [id number]

## 2011-06-18 ENCOUNTER — Encounter (HOSPITAL_COMMUNITY): Payer: Self-pay | Admitting: Radiology

## 2011-06-18 ENCOUNTER — Emergency Department (HOSPITAL_COMMUNITY): Payer: Medicare Other

## 2011-06-18 ENCOUNTER — Inpatient Hospital Stay (HOSPITAL_COMMUNITY)
Admission: EM | Admit: 2011-06-18 | Discharge: 2011-06-25 | DRG: 065 | Disposition: A | Discharge status: Skilled Nursing Facility | Payer: Medicare Other | Attending: Family Medicine | Admitting: Family Medicine

## 2011-06-18 DIAGNOSIS — I4891 Unspecified atrial fibrillation: Secondary | ICD-10-CM | POA: Diagnosis present

## 2011-06-18 DIAGNOSIS — N400 Enlarged prostate without lower urinary tract symptoms: Secondary | ICD-10-CM | POA: Diagnosis present

## 2011-06-18 DIAGNOSIS — I509 Heart failure, unspecified: Secondary | ICD-10-CM | POA: Diagnosis present

## 2011-06-18 DIAGNOSIS — J4489 Other specified chronic obstructive pulmonary disease: Secondary | ICD-10-CM | POA: Diagnosis present

## 2011-06-18 DIAGNOSIS — Z85038 Personal history of other malignant neoplasm of large intestine: Secondary | ICD-10-CM

## 2011-06-18 DIAGNOSIS — Z9181 History of falling: Secondary | ICD-10-CM

## 2011-06-18 DIAGNOSIS — R195 Other fecal abnormalities: Secondary | ICD-10-CM | POA: Diagnosis present

## 2011-06-18 DIAGNOSIS — I10 Essential (primary) hypertension: Secondary | ICD-10-CM | POA: Diagnosis present

## 2011-06-18 DIAGNOSIS — E785 Hyperlipidemia, unspecified: Secondary | ICD-10-CM | POA: Diagnosis present

## 2011-06-18 DIAGNOSIS — J449 Chronic obstructive pulmonary disease, unspecified: Secondary | ICD-10-CM | POA: Diagnosis present

## 2011-06-18 DIAGNOSIS — D649 Anemia, unspecified: Secondary | ICD-10-CM | POA: Diagnosis present

## 2011-06-18 DIAGNOSIS — R279 Unspecified lack of coordination: Secondary | ICD-10-CM | POA: Diagnosis present

## 2011-06-18 DIAGNOSIS — Z79899 Other long term (current) drug therapy: Secondary | ICD-10-CM

## 2011-06-18 DIAGNOSIS — Z7901 Long term (current) use of anticoagulants: Secondary | ICD-10-CM

## 2011-06-18 DIAGNOSIS — I5032 Chronic diastolic (congestive) heart failure: Secondary | ICD-10-CM | POA: Diagnosis present

## 2011-06-18 DIAGNOSIS — I6340 Cerebral infarction due to embolism of unspecified cerebral artery: Principal | ICD-10-CM | POA: Diagnosis present

## 2011-06-18 DIAGNOSIS — E119 Type 2 diabetes mellitus without complications: Secondary | ICD-10-CM | POA: Diagnosis present

## 2011-06-18 DIAGNOSIS — M810 Age-related osteoporosis without current pathological fracture: Secondary | ICD-10-CM | POA: Diagnosis present

## 2011-06-18 HISTORY — DX: Unspecified atrial fibrillation: I48.91

## 2011-06-18 HISTORY — DX: Malignant neoplasm of colon, unspecified: C18.9

## 2011-06-18 HISTORY — DX: Hyperlipidemia, unspecified: E78.5

## 2011-06-18 HISTORY — DX: Essential (primary) hypertension: I10

## 2011-06-18 LAB — CK: Total CK: 173 U/L (ref 7–232)

## 2011-06-18 LAB — COMPREHENSIVE METABOLIC PANEL
BUN: 23 mg/dL (ref 6–23)
CO2: 24 mEq/L (ref 19–32)
Chloride: 102 mEq/L (ref 96–112)
Creatinine, Ser: 0.98 mg/dL (ref 0.50–1.35)
GFR calc non Af Amer: >60 mL/min (ref >60)
Glucose, Bld: 121 mg/dL — ABNORMAL HIGH (ref 70–99)
Total Bilirubin: 0.2 mg/dL — ABNORMAL LOW (ref 0.3–1.2)

## 2011-06-18 LAB — CBC
MCH: 25.4 pg — ABNORMAL LOW (ref 26.0–34.0)
MCHC: 32.2 g/dL (ref 30.0–36.0)
Platelets: 335 10*3/uL (ref 150–400)
RBC: 3.31 MIL/uL — ABNORMAL LOW (ref 4.22–5.81)

## 2011-06-18 LAB — URINALYSIS, ROUTINE W REFLEX MICROSCOPIC
Bilirubin Urine: NEGATIVE
Glucose, UA: NEGATIVE mg/dL
Hgb urine dipstick: NEGATIVE
Ketones, ur: NEGATIVE mg/dL
Protein, ur: NEGATIVE mg/dL

## 2011-06-18 LAB — PROTIME-INR: Prothrombin Time: 16 seconds — ABNORMAL HIGH (ref 11.6–15.2)

## 2011-06-18 LAB — AMMONIA: Ammonia: 19 umol/L (ref 11–60)

## 2011-06-18 LAB — DIFFERENTIAL
Basophils Relative: 1 % (ref 0–1)
Eosinophils Absolute: 0.3 10*3/uL (ref 0.0–0.7)
Monocytes Relative: 13 % — ABNORMAL HIGH (ref 3–12)
Neutrophils Relative %: 61 % (ref 43–77)

## 2011-06-19 ENCOUNTER — Inpatient Hospital Stay (HOSPITAL_COMMUNITY): Payer: Medicare Other

## 2011-06-19 LAB — LIPASE, BLOOD: Lipase: 20 U/L (ref 11–59)

## 2011-06-19 LAB — COMPREHENSIVE METABOLIC PANEL
ALT: 8 U/L (ref 0–53)
AST: 18 U/L (ref 0–37)
Albumin: 3.3 g/dL — ABNORMAL LOW (ref 3.5–5.2)
Alkaline Phosphatase: 78 U/L (ref 39–117)
BUN: 21 mg/dL (ref 6–23)
CO2: 23 mEq/L (ref 19–32)
Calcium: 9.4 mg/dL (ref 8.4–10.5)
Chloride: 107 mEq/L (ref 96–112)
Creatinine, Ser: 0.93 mg/dL (ref 0.50–1.35)
GFR calc Af Amer: >60 mL/min (ref >60)
GFR calc non Af Amer: >60 mL/min (ref >60)
Glucose, Bld: 163 mg/dL — ABNORMAL HIGH (ref 70–99)
Potassium: 4.2 mEq/L (ref 3.5–5.1)
Sodium: 139 mEq/L (ref 135–145)
Total Bilirubin: 0.3 mg/dL (ref 0.3–1.2)
Total Protein: 6.8 g/dL (ref 6.0–8.3)

## 2011-06-19 LAB — LIPID PANEL
Cholesterol: 161 mg/dL (ref 0–200)
HDL: 56 mg/dL (ref >39)
Triglycerides: 58 mg/dL (ref <150)
VLDL: 12 mg/dL (ref 0–40)

## 2011-06-19 LAB — GLUCOSE, CAPILLARY
Glucose-Capillary: 160 mg/dL — ABNORMAL HIGH (ref 70–99)
Glucose-Capillary: 140 mg/dL — ABNORMAL HIGH (ref 70–99)
Glucose-Capillary: 109 mg/dL — ABNORMAL HIGH (ref 70–99)

## 2011-06-19 LAB — HEMOGLOBIN A1C
Hgb A1c MFr Bld: 6.1 % — ABNORMAL HIGH (ref <5.7)
Mean Plasma Glucose: 128 mg/dL — ABNORMAL HIGH (ref <117)

## 2011-06-19 LAB — CBC
HCT: 24.6 % — ABNORMAL LOW (ref 39.0–52.0)
Hemoglobin: 7.9 g/dL — ABNORMAL LOW (ref 13.0–17.0)
MCH: 25.2 pg — ABNORMAL LOW (ref 26.0–34.0)
MCHC: 32.1 g/dL (ref 30.0–36.0)
MCV: 78.6 fL (ref 78.0–100.0)
Platelets: 358 10*3/uL (ref 150–400)
RBC: 3.13 MIL/uL — ABNORMAL LOW (ref 4.22–5.81)
RDW: 14.6 % (ref 11.5–15.5)
WBC: 5.8 10*3/uL (ref 4.0–10.5)

## 2011-06-19 LAB — DIFFERENTIAL
Basophils Absolute: 0 10*3/uL (ref 0.0–0.1)
Basophils Relative: 0 % (ref 0–1)
Eosinophils Absolute: 0 10*3/uL (ref 0.0–0.7)
Eosinophils Relative: 0 % (ref 0–5)
Monocytes Absolute: 0.1 10*3/uL (ref 0.1–1.0)

## 2011-06-19 LAB — CARDIAC PANEL(CRET KIN+CKTOT+MB+TROPI)
CK, MB: 4.1 ng/mL — ABNORMAL HIGH (ref 0.3–4.0)
Relative Index: 2 (ref 0.0–2.5)
Total CK: 204 U/L (ref 7–232)
Troponin I: <0.3 ng/mL (ref <0.30)
Troponin I: <0.3 ng/mL (ref <0.30)

## 2011-06-20 DIAGNOSIS — I517 Cardiomegaly: Secondary | ICD-10-CM

## 2011-06-20 LAB — BASIC METABOLIC PANEL
Calcium: 9.6 mg/dL (ref 8.4–10.5)
Creatinine, Ser: 0.92 mg/dL (ref 0.50–1.35)
GFR calc Af Amer: >60 mL/min (ref >60)
GFR calc non Af Amer: >60 mL/min (ref >60)

## 2011-06-20 LAB — GLUCOSE, CAPILLARY
Glucose-Capillary: 104 mg/dL — ABNORMAL HIGH (ref 70–99)
Glucose-Capillary: 120 mg/dL — ABNORMAL HIGH (ref 70–99)

## 2011-06-20 LAB — CROSSMATCH
ABO/RH(D): O POS
Unit division: 0

## 2011-06-20 LAB — CBC
MCH: 26.1 pg (ref 26.0–34.0)
MCHC: 32.8 g/dL (ref 30.0–36.0)
MCV: 79.6 fL (ref 78.0–100.0)
Platelets: 319 10*3/uL (ref 150–400)
RDW: 14.4 % (ref 11.5–15.5)

## 2011-06-20 LAB — URINE CULTURE
Colony Count: NO GROWTH
Culture  Setup Time: 201208171840

## 2011-06-21 LAB — BASIC METABOLIC PANEL
Calcium: 9.9 mg/dL (ref 8.4–10.5)
GFR calc Af Amer: >60 mL/min (ref >60)
GFR calc non Af Amer: >60 mL/min (ref >60)
Glucose, Bld: 93 mg/dL (ref 70–99)
Potassium: 3.8 mEq/L (ref 3.5–5.1)
Sodium: 141 mEq/L (ref 135–145)

## 2011-06-21 LAB — CBC
HCT: 33.9 % — ABNORMAL LOW (ref 39.0–52.0)
MCV: 80.1 fL (ref 78.0–100.0)
Platelets: 360 10*3/uL (ref 150–400)
RBC: 4.23 MIL/uL (ref 4.22–5.81)
WBC: 9.4 10*3/uL (ref 4.0–10.5)

## 2011-06-21 LAB — GLUCOSE, CAPILLARY
Glucose-Capillary: 102 mg/dL — ABNORMAL HIGH (ref 70–99)
Glucose-Capillary: 98 mg/dL (ref 70–99)
Glucose-Capillary: 98 mg/dL (ref 70–99)

## 2011-06-22 DIAGNOSIS — I6330 Cerebral infarction due to thrombosis of unspecified cerebral artery: Secondary | ICD-10-CM

## 2011-06-22 LAB — BASIC METABOLIC PANEL
BUN: 23 mg/dL (ref 6–23)
Creatinine, Ser: 1.08 mg/dL (ref 0.50–1.35)
GFR calc non Af Amer: >60 mL/min (ref >60)
Glucose, Bld: 113 mg/dL — ABNORMAL HIGH (ref 70–99)
Potassium: 4.3 mEq/L (ref 3.5–5.1)

## 2011-06-22 LAB — GLUCOSE, CAPILLARY
Glucose-Capillary: 122 mg/dL — ABNORMAL HIGH (ref 70–99)
Glucose-Capillary: 131 mg/dL — ABNORMAL HIGH (ref 70–99)

## 2011-06-22 LAB — CBC
MCH: 26.6 pg (ref 26.0–34.0)
MCHC: 33 g/dL (ref 30.0–36.0)
MCV: 80.4 fL (ref 78.0–100.0)
Platelets: 353 10*3/uL (ref 150–400)
RBC: 4.03 MIL/uL — ABNORMAL LOW (ref 4.22–5.81)

## 2011-06-23 LAB — CBC
HCT: 33.4 % — ABNORMAL LOW (ref 39.0–52.0)
Hemoglobin: 10.9 g/dL — ABNORMAL LOW (ref 13.0–17.0)
MCH: 26.1 pg (ref 26.0–34.0)
RBC: 4.17 MIL/uL — ABNORMAL LOW (ref 4.22–5.81)

## 2011-06-23 LAB — GLUCOSE, CAPILLARY
Glucose-Capillary: 111 mg/dL — ABNORMAL HIGH (ref 70–99)
Glucose-Capillary: 99 mg/dL (ref 70–99)

## 2011-06-24 LAB — BASIC METABOLIC PANEL
CO2: 25 mEq/L (ref 19–32)
Glucose, Bld: 106 mg/dL — ABNORMAL HIGH (ref 70–99)
Potassium: 4.3 mEq/L (ref 3.5–5.1)
Sodium: 138 mEq/L (ref 135–145)

## 2011-06-24 LAB — CBC
Hemoglobin: 11 g/dL — ABNORMAL LOW (ref 13.0–17.0)
RBC: 4.2 MIL/uL — ABNORMAL LOW (ref 4.22–5.81)

## 2011-06-27 NOTE — Discharge Summary (Signed)
  NAME:  Todd [name], Todd [name] NO.:  [id number]  MEDICAL RECORD NO.:  [id number]  LOCATION:  3002                         FACILITY:  [facility]  PHYSICIAN:  Tarry Kos, MD       DATE OF BIRTH:  April 20, 1924  DATE OF ADMISSION:  06/18/2011 DATE OF DISCHARGE:                              DISCHARGE SUMMARY   ADDENDUM  The patient's addendum was further delayed because of bed availability at his rehab center.  He is being discharged for aggressive physical therapy today.  PHYSICAL EXAMINATION:  GENERAL:  He is alert and oriented in no apparent distress, afebrile. VITAL SIGNS:  Stable. CARDIAC:  Regular rate and rhythm without murmurs. CHEST:  Clear to auscultation bilaterally.  No wheeze, rhonchi, or rales. ABDOMEN:  Soft, nontender, nondistended.  Positive bowel sounds.  No hepatosplenomegaly. EXTREMITIES:  Without clubbing, cyanosis, or edema. PSYCH:  Normal affect. NEURO:  No focal neurologic deficits. SKIN:  No rashes.  His son was updated yesterday.  He will need to have a followup with his primary care physician in 1-2 weeks.  I will closely monitor him for any bleeding issues due to the increase in his antiplatelet and anticoagulate treatment for prevention of future stroke.  This was discussed with son.  He agrees with the treatment plan.          ______________________________ Tarry Kos, MD     RD/MEDQ  D:  06/25/2011  T:  06/25/2011  Job:  [id number]  Electronically Signed by Tarry Kos MD on 06/27/2011 02:09:54 PM

## 2011-06-27 NOTE — Discharge Summary (Signed)
  NAME:  Todd [name], TATSCH NO.:  [id number]  MEDICAL RECORD NO.:  [id number]  LOCATION:  3002                         FACILITY:  [facility]  PHYSICIAN:  Tarry Kos, MD       DATE OF BIRTH:  [birth year]  DATE OF ADMISSION:  06/18/2011 DATE OF DISCHARGE:  06/24/2011                              DISCHARGE SUMMARY   ADDENDUM  Addendum to discharge summary from yesterday.  Todd [name] is doing well with physical therapy.  He has no complaints this morning.  PHYSICAL EXAMINATION:  VITAL SIGNS:  He has been afebrile.  Vital signs stable. GENERAL:  He is alert and oriented x4.  No apparent distress, cooperative, and friendly. COR:  Regular rate and rhythm without murmurs, rubs, or gallops. CHEST:  Clear to auscultation bilaterally.  No wheeze, rhonchi, or rales. ABDOMEN:  Soft, nontender, nondistended.  Positive bowel sounds.  No hepatosplenomegaly. EXTREMITIES:  No clubbing, cyanosis, or edema. PSYCH:  Normal affect.NEURO:  No focal neurologic deficits.  Can move all extremities with no focal weakness anywhere.  The patient can be discharged to skilled nursing facility as last rehab bed was available.  I suspect that he will have full recovery with just a couple of weeks of rehabilitation before returning to home.  Please see discharge med rec sheet for full details of his discharge medications.          ______________________________ Tarry Kos, MD     RD/MEDQ  D:  06/24/2011  T:  06/24/2011  Job:  [id number]  Electronically Signed by Tarry Kos MD on 06/27/2011 02:09:47 PM

## 2011-07-05 NOTE — Consult Note (Signed)
NAME:  Todd, [name] NO.:  [id number]  MEDICAL RECORD NO.:  [id number]  LOCATION:  3002                         FACILITY:  [facility]  PHYSICIAN:  Beryle Beams, MD   DATE OF BIRTH:  [birth year]  DATE OF CONSULTATION:  06/20/2011 DATE OF DISCHARGE:                                CONSULTATION   CONSULTING PHYSICIAN:  I cannot read the signature, however, is on call for [facility] today that would be June 20, 2011. Signature is actually illegible.  I think it is Dr. Fran Lowes, I believe.  HISTORY:  Todd [name] is an 77 year old white male with a known prior history of atrial fibrillation on Pradaxa, congestive heart failure, hypertension, colon cancer as well non-insulin-dependent diabetes who is undergoing evaluation for stroke.  History is according to review of the hospital chart.  The patient is a poor historian.  Todd [name] was admitted on June 18, 2011, after apparently presenting with difficulties walking over the prior 2-3 days.  He apparently had lost his balance, had multiple falls, and complained of fatigue easily and being short of breath.  There was also noted to be some mild confusion over the preceding 24 hours.  In the emergency room, he had a head CT, which was unremarkable.  He then proceeded to have a head MRI yesterday, which revealed evidence of extensive multifocal left greater than right areas of acute infarction that were mostly suggestive of watershed distribution.  More specifically, there was noted to be extensive multifocal left hemispheric acute infarcts that were all supratentorial including left basilar ganglia with 2-3 years of acute infarction in the right parietooccipital region.  Head MRA was also obtained that was unremarkable.  It should be noted that he has had his Pradaxa held as there has been a question of GI bleed.  Neurology has not consulted for further opinion regarding stroke management.  PAST  MEDICAL HISTORY:  Significant for: 1. Atrial fibrillation, on Pradaxa as well as history of diastolic     congestive heart failure. 2. Hypertension. 3. Dyslipidemia. 4. Colon cancer. 5. History of benign prostatic hypertrophy. 6. Osteoporosis. 7. History of colonic polyps. 8. Non-insulin-dependent diabetes. 9. History of lung nodule.  PAST SURGICAL HISTORY:  Colon cancer resection.  MEDICATIONS:  At this time include Ventolin, Norvasc, Pulmicort, dabigatran, Flonase, fluticasone, hydrochlorothiazide, Singulair, Benicar, Protonix, Zocor and also p.r.n. medicines including Ventolin, Zofran, and Ambien.  ALLERGIES:  No known drug allergies.  SOCIAL HISTORY:  He resides in [place].  He lives with his wife.  He does not use tobacco or alcohol.  FAMILY HISTORY:  Significant for hypertension.  REVIEW OF SYSTEMS:  Neurological system as above.  Reviewed admission review of systems and agree with these as noted.  PHYSICAL EXAMINATION:  GENERAL:  An elderly white male who is in no acute distress.  He is currently afebrile.VITAL SIGNS:  Stable.  Blood pressure 144/77. NEUROLOGIC:  He is alert and at this time appears to be oriented x3.  He does have fluent speech.  He is little bit slow to recall the place and the day of the week.  He can name, repeat, and follow commands and  other areas of  high intellectual function appeared to be intact.  His extraocular movements are intact without gaze deviation.  Pupils are equal, round, and reactive to light.  Face is symmetric.  Otherwise, cranial nerves II-XII appear to be intact.  On motor examination, he has normal bulk and tone with 5/5 strength throughout.  No obvious focal weakness is noted.  Sensory exam reveals roughly symmetric pinprick and vibration.  On cerebellar exam, normal finger-to-nose on the left arm. He is slightly ataxic in performing this with the right arm.  Deep tendon reflexes were graded 1+ with  left toe downgoing and right toe equivocal. CARDIOVASCULAR:  Regular rate and rhythm without murmur or gallop. NECK:  No evidence of carotid bruits. EXTREMITIES:  No evidence of cyanosis, clubbing, or edema. ABDOMEN:  Soft, nontender, normoactive bowel sounds. LUNGS:  Clear to auscultation bilaterally. SKIN:  His skin has what appears to be maybe a few small areas of ecchymoses on the arms, otherwise unremarkable.  LABORATORY STUDIES:  Unremarkable with hematocrit and hematocrit of 9.6 and 29.3.  Chemistry-7 mostly unremarkable.  ASSESSMENT: 1. Multifocal strokes (acute), likely embolic. 2. Atrial fibrillation, likely associated with multifocal strokes.  DISCUSSION:  At this time, Todd [name] presents with a number of vascular risk factors outlined above and more acute onset of some ataxia, slurred speech, and confusion.  He has been on Pradaxa for atrial fibrillation, but this has been held due to questionable GI bleed.  MRI now shows evidence of multifocal strokes and appeared to have watershed distribution.  He does have some ataxia with his right arm on exam.  PLAN:  Overall, this is a very difficult situation.  Obviously, he needs Pradaxa due to atrial fibrillation which likely caused his multifocal strokes, but note this is being held due to questionable GI bleed.  At this point, I would suggest restarting Pradaxa once comfortable from a GI standpoint.  In the meantime, aspirin or potentially Plavix would be our best option along with the statin.  I would continue vascular risk factor modification.  I also would encourage PT, OT, and speech therapy evaluations.  I suspect there may be a component of multiple sensory deficits (strokes, diminished vestibular function, as well as possible diabetic neuropathy) that is likely the cause of his gait ataxia.  Thank you very much for allowing me to participate in the care of this very interesting pleasant patient.           ______________________________ Beryle Beams, MD     RY/MEDQ  D:  06/20/2011  T:  06/21/2011  Job:  [id number]  Electronically Signed by Beryle Beams MD on 07/05/2011 09:01:55 AM

## 2011-07-06 NOTE — H&P (Signed)
NAME:  Todd [name], Todd [name] NO.:  [id number]  MEDICAL RECORD NO.:  [id number]  LOCATION:  [facility]                         FACILITY:  [facility]  PHYSICIAN:  Eduard Clos, MDDATE OF BIRTH:  [birth year]  DATE OF ADMISSION:  06/18/2011 DATE OF DISCHARGE:                             HISTORY & PHYSICAL   PRIMARY CARE PHYSICIAN:  Dr. Vaughan Basta of [facility].  CHIEF COMPLAINT:  Difficulty walking and frequent falls.  HISTORY OF PRESENT ILLNESS:  This 77 year old male with known history of atrial fibrillation, on Pradaxa, history of diastolic CHF with last EF measured was 65-70% in March 2012, history of hypertension, bronchial asthma, history of CA of colon status post resection has been experiencing difficulty walking over the last 2-3 days.  Last week, he had a bout of diarrhea at least three episodes, after which it got resolved by itself.  Over the last 3 days, he has been having increasing difficulty walking.  He loses his balance, had multiple falls, did not lose consciousness also gets fatigued easily and is also short of breath.  The patient did not have any chest pain, did not have any nausea or vomiting or any abdominal pain.  He is hard-of-hearing.  He did not have any visual problems, did not have any difficulty speaking or swallowing.  He was mildly confused over the last 24 hours, sometimes talking not making sense.  He did not lose function of his upper or lower extremities.  He had gone to his PCP today who referred him to the ER.  In the ER, the patient had a CT head, x-rays of chest, EKGs, all of which at this time does not show anything acute.  He does have chronic anemia, and his hemoglobin is at his baseline.  The patient has been admitted for further workup.  PAST MEDICAL HISTORY: 1. History of atrial fibrillation, on Pradaxa. 2. History of diastolic CHF, last EF measured was 60-65% in March     2012. 3. History of hypertension. 4.  Hyperlipidemia. 5. History of CA colon status post resection. 6. History of BPH. 7. Osteoporosis. 8. History of colonic polyps. 9. History of non-insulin-dependent diabetes, presently on no     medications. 10.History of lung nodule.  PAST SURGICAL HISTORY:  History of colon CA status post resection.  MEDICATIONS PRIOR TO ADMISSION:  As per the list provided by the patient's wife includes Advair Diskus 500/50 one puff twice daily, Ventolin HFA, Rhinocort, Singulair, Atacand HCT 32/12.5 mg p.o. daily, pravastatin 20 mg p.o. daily, Norvasc 5 mg daily, Lunesta 2 mg.  ALLERGIES:  Questionable for LEVAQUIN.  SOCIAL HISTORY:  The patient lives with his wife.  Denies smoking cigarettes, drinking alcohol, or using illegal drugs.  FAMILY HISTORY:  Positive for hypertension.  REVIEW OF SYSTEMS:  As per the history of presenting illness, nothing else significant.  PHYSICAL EXAMINATION:  GENERAL:  The patient was examined at bedside, not in acute distress. VITAL SIGNS:  Blood pressure is 133/60, pulse is 80 per minute, temperature 98, respirations 18 per minute, O2 sat is 98%. HEENT:  Anicteric.  No pallor.  PERLA positive.  No facial asymmetry. Tongue is midline.  No neck  rigidity.  No discharge from ears, eyes, nose, or mouth. CHEST:  Bilateral air entry present.  No rhonchi, no crepitation. HEART:  S1 and S2 heard. ABDOMEN:  Soft, nontender.  Bowel sounds heard. CENTRAL NERVOUS SYSTEM:  The patient is alert, awake.  He is a little bit slow in his thinking.  He knows his name, he recognizes family.  He knows he is in the hospital, has excessive difficulty saying the exact date and time.  He is moving his upper and lower extremities 5/5 but no pronator drift and there is dysdiadochokinesia and when the ER physician made him walk, he was having very difficulty, two people had to hold him. EXTREMITIES:  Peripheral pulses felt.  No acute ischemic changes, cyanosis, or  clubbing.  LABORATORY DATA:  Chest x-ray shows chronic lung changes with minimal bibasilar atelectasis, no definite acute infiltrates. CT head without contrast shows atrophy and no acute intracranial abnormalities. CBC:  WBC is 7.1, hemoglobin is 8.4, hematocrit is 26.1, platelets 335. PT and INR are 16 and 1.2.  Complete metabolic panel:  Sodium 137, potassium 3.3, chloride 102, carbon dioxide 24, glucose 121, BUN 23, creatinine 0.9, total bilirubin is 0.2, alkaline phosphatase 81, AST 17, ALT 10, total protein 6.9, albumin 3.4, calcium 9.5, ammonia 19.  CK is 173, troponins 0, BNP is 1358.  UA is negative for nitrites and leukocytes.  ASSESSMENT: 1. Ataxia. 2. Shortness of breath with history of bronchial asthma and diastolic     heart failure. 3. History of atrial fibrillation, presently rate controlled, he is on     Pradaxa. 4. History of hypertension. 5. History of hyperlipidemia. 6. History of carcinoma colon status post resection. 7. Chronic anemia. 8. History of hearing impaired.  PLAN: 1. At this time, I will admit the patient to telemetry. 2. For his ataxia, at this time we are concerned about his stroke.  We     will place the patient on neuro checks and get swallow evaluation,     get MRI of the brain, MRA of the brain, carotid Doppler, 2-D echo.     We will also check orthostatics with a PT/OT consult and based on     this, we will further plan recommendations. 3. Shortness of breath.  At this time, his chest x-ray does not show     any definite acute infiltrates or findings.  He is not wheezing on     exam.  He does appear mildly short of breath.  At this time, I am     going to continue with his nebulizer.  I will also add Pulmicort,     and I will give one dose of Solu-Medrol as he does have history of     asthma.  We will continue present medicine include Atacand HCT and     I am going to cycle cardiac markers.  I am ordering a D-dimer. 4. Chronic anemia.   Presently, his hemoglobin is at the baseline.  We     will check anemia panel and stool for     occult blood. 5. History of atrial fibrillation.  He is on Pradaxa which we will     continue at this time, his rate is controlled. 6. Further recommendation based on the test order and clinic course.     Eduard Clos, MD     ANK/MEDQ  D:  06/18/2011  T:  06/18/2011  Job:  [id number]  cc:   Dr. Vaughan Basta of [facility]  Electronically Signed by Midge Minium MD on 07/06/2011 09:33:16 AM

## 2011-07-20 NOTE — Discharge Summary (Signed)
  NAME:  NAEL, PETROSYAN NO.:  [id number]  MEDICAL RECORD NO.:  [id number]  LOCATION:  3002                         FACILITY:  [facility]  PHYSICIAN:  Gregor Dershem, DO         DATE OF BIRTH:  1924/02/11  DATE OF ADMISSION:  06/18/2011 DATE OF DISCHARGE:  06/23/2011                              DISCHARGE SUMMARY   ADMISSION DIAGNOSES:  Included acute cerebrovascular accident, atrial fibrillation, anemia, Hemoccult-positive stools, hypertension, dyslipidemia, history of colon cancer, non-insulin dependent diabetes, and history of lung nodule.  HISTORY OF PRESENT ILLNESS:  Please see H and P.  HOSPITAL COURSE:  The patient was admitted to telemetry.  MRI of the brain showed evidence of multifocal strokes and suggested a watershed etiology.  The day following admission, the patient's hemoglobin dropped to 7.4 and he was found to have Hemoccult-positive stools.  There was concern that the patient may have led down to 7.4 and caused a stroke in this way.  His Pradaxa was stopped and he had the stroke on Pradaxa.  GI was consulted as was Neurology.  Neurology felt that the patient's stroke was due to atrial fibrillation and not a watershed incident. However, given the patient's severe anemia, they agreed with holding his Pradaxa while the GI workup was initiated.  PT and OT have also evaluated the patient and he has been deemed most appropriate for SNF. The patient's colonoscopy showed only diverticulosis and his EGD also was negative.  I have discussed this patient with Dr. Pearlean Brownie, and as per his recommendation, the patient will now be started on Xarelto and we will stop his aspirin.  We are now awaiting a SNF placement.  DIAGNOSES:  Today include: 1. Atrial fibrillation which is rate controlled, on Xarelto for     cerebrovascular accident prophylaxis. 2. Stroke secondary to atrial fibrillation while on Pradaxa. 3. Severe anemia with Hemoccult-positive stools, status  post     transfusion with 2 units of packed RBCs.  EGD and colonoscopy are     benign, so again the patient is okay for anticoagulation per     Gastroenterology.  The patient has an ataxic gait.  PT and OT have     been consulted.  We are awaiting a SNF placement.  Thirty-six minutes on this patient visit and this patient today.          ______________________________ Fran Lowes, DO     AS/MEDQ  D:  06/23/2011  T:  06/24/2011  Job:  [id number]  Electronically Signed by Fran Lowes DO on 07/20/2011 10:39:47 PM

## 2011-07-21 NOTE — Op Note (Signed)
  NAME:  Todd [name], Todd [name] NO.:  [id number]  MEDICAL RECORD NO.:  [id number]  LOCATION:  3002                         FACILITY:  [facility]  PHYSICIAN:  Petra Kuba, M.D.    DATE OF BIRTH:  1924-03-09  DATE OF PROCEDURE:  06/23/2011 DATE OF DISCHARGE:                              OPERATIVE REPORT   PROCEDURE:  Esophagogastroduodenoscopy.  SURGEON:  Petra Kuba, MD  INDICATIONS:  Guaiac-positive anemia in a patient on blood thinners, nondiagnostic colonoscopy 2 years ago by Dr. Arlyce Dice.  Consent was signed after risks, benefits, methods, options were thoroughly discussed prior to any sedation, both yesterday and today.  MEDICINES USED:  Fentanyl 25 mcg, Versed 2 mg.  PROCEDURE IN DETAIL:  The video endoscope was inserted by direct vision. A quick look at the vocal cords were normal.  Scope was inserted down the normal esophagus.  He did have a small hiatal hernia.  Scope passed into the stomach and advanced through a normal antrum, normal pylorus into a normal duodenal bulb around the C-loop to a normal second portion of the duodenum.  No blood was seen distally.  Scope was slowly withdrawn back to the bulb.  A good look there ruled out ulcers in that location.  Scope was withdrawn back to stomach and retroflexed.  Cardia, fundus, angularis, lesser and greater curve were all normal on retroflex visualization.  Straight visualization of the stomach did not reveal any additional findings.  Air was suctioned.  Scope was slowly withdrawn. Again, a good look at the esophagus was normal.  Scope was removed.  The patient tolerated the procedure well.  There was no obvious immediate complication.  ENDOSCOPIC DIAGNOSES: 1. Small hiatal hernia. 2. Otherwise normal esophagogastroduodenoscopy.  PLAN:  Okay for blood thinners.  If he is going to be on aspirin, then probably continue pump inhibitor to protect the stomach.  If he continues to bleed when put back on stronger  blood thinners, we would recommend repeat colonoscopy and possible further workup and plans like capsule endoscopy or even a one-time CAT scan.  We can hold off for now. Happy to see back p.r.n.  Please let me know if I can help.          ______________________________ Petra Kuba, M.D.    MEM/MEDQ  D:  06/23/2011  T:  06/23/2011  Job:  [id number]  Electronically Signed by Vida Rigger M.D. on 07/21/2011 02:53:59 PM

## 2011-07-21 NOTE — Consult Note (Signed)
  NAME:  Todd [name], Todd [name] NO.:  [id number]  MEDICAL RECORD NO.:  [id number]  LOCATION:  3002                         FACILITY:  [facility]  PHYSICIAN:  Petra Kuba, M.D.    DATE OF BIRTH:  1924-08-09  DATE OF CONSULTATION:  06/21/2011 DATE OF DISCHARGE:                                CONSULTATION   HISTORY:  The patient is seen at request of the hospitalist for guaiac- positive anemia.  He has a workup in the past by Dr. Arlyce Dice with at least a colonoscopy in 2002, although he cannot tell me the date. Currently, we are checking with their office.  Currently, he denies any GI complaints.  Specifically, he has not seen any blood in his bowels nor does he have any upper tract symptoms and we are consulted for further workup and plans.  He has recently had a CVA and there is a question about need for anticoagulation.  PAST MEDICAL HISTORY:  Pertinent for: 1. AFib on Pradaxa with history of heart failure. 2. Hypertension. 3. Increased cholesterol. 4. Colon cancer, status post resection. 5. BPH. 6. Osteoporosis. 7. History of colon polyps. 8. Diabetes. 9. History of lung nodule.  His only surgery is the colon cancer.  MEDICATIONS AT HOME PRIOR TO ADMISSION:  Advair, Ventolin, Rhinocort, Singulair, Atacand, pravastatin, Norvasc, and Lunesta.  ALLERGIES:  LEVAQUIN.  SOCIAL HISTORY:  He does live with his wife.  He has quit tobacco and alcohol.  FAMILY HISTORY:  Noncontributory.  REVIEW OF SYSTEMS:  Negative except above.  PHYSICAL EXAMINATION:  The patient with some increased respiratory rate, but no acute distress.  Does not hear very well.  Abdomen is soft and nontender.  LABORATORY DATA:  Pertinent for a discharge hemoglobin in March of 9.2 with an MCV of 85.  On admission, his hemoglobin was 8.4 with a lower MCV of 78, normal white count and platelet count.  PT was normal as was PTT.  BUN 23, creatinine 0.9.  Liver tests normal.  Albumin 3.4.  He  was probably transfused with a hemoglobin today of 11.  He did have a PET scan in 2008, but no other obvious GI workup since then.  ASSESSMENT: 1. Multiple medical problems. 2. History of colon cancer and colon polyps. 3. Guaiac-positive anemia with decreased MCV.  At some point, he was     on blood thinners for his AFib in the past, unsure of whether he     was on admission.  PLAN:  We will try to get his most recent endoscopic records off the computer and hopefully his wife will be here tomorrow and we can discuss repeat workup.  Maybe she will know more of the history.  I am not sure he is in any shape to proceed with a workup, but we will check on him tomorrow and help decide.          ______________________________ Petra Kuba, M.D.     MEM/MEDQ  D:  06/21/2011  T:  06/22/2011  Job:  [id number]  cc:   Dr. Vaughan Basta  Electronically Signed by Vida Rigger M.D. on 07/21/2011 02:53:56 PM

## 2011-07-26 LAB — COMPREHENSIVE METABOLIC PANEL
ALT: 19
BUN: 16
CO2: 25
Calcium: 10.4
Creatinine, Ser: 1.18
GFR calc non Af Amer: 59 — ABNORMAL LOW
Glucose, Bld: 119 — ABNORMAL HIGH
Sodium: 136

## 2011-07-26 LAB — CBC
HCT: 38.7 — ABNORMAL LOW
Hemoglobin: 12.8 — ABNORMAL LOW
MCHC: 33.1
MCV: 88.5
RBC: 4.37

## 2011-07-26 LAB — FUNGUS CULTURE W SMEAR

## 2011-07-26 LAB — PROTIME-INR: Prothrombin Time: 13.7

## 2011-07-26 LAB — AFB CULTURE WITH SMEAR (NOT AT ARMC): Acid Fast Smear: NONE SEEN

## 2011-07-26 LAB — CULTURE, RESPIRATORY W GRAM STAIN

## 2011-09-06 ENCOUNTER — Inpatient Hospital Stay (HOSPITAL_COMMUNITY)
Admission: EM | Admit: 2011-09-06 | Discharge: 2011-09-20 | DRG: 870 | Disposition: A | Discharge status: Skilled Nursing Facility | Payer: Medicare Other | Attending: Internal Medicine | Admitting: Internal Medicine

## 2011-09-06 ENCOUNTER — Other Ambulatory Visit: Payer: Self-pay

## 2011-09-06 ENCOUNTER — Emergency Department (HOSPITAL_COMMUNITY): Payer: Medicare Other

## 2011-09-06 ENCOUNTER — Encounter (HOSPITAL_COMMUNITY): Payer: Self-pay

## 2011-09-06 DIAGNOSIS — I4891 Unspecified atrial fibrillation: Secondary | ICD-10-CM | POA: Diagnosis present

## 2011-09-06 DIAGNOSIS — N179 Acute kidney failure, unspecified: Secondary | ICD-10-CM | POA: Diagnosis present

## 2011-09-06 DIAGNOSIS — R0789 Other chest pain: Secondary | ICD-10-CM

## 2011-09-06 DIAGNOSIS — J479 Bronchiectasis, uncomplicated: Secondary | ICD-10-CM

## 2011-09-06 DIAGNOSIS — E876 Hypokalemia: Secondary | ICD-10-CM | POA: Diagnosis not present

## 2011-09-06 DIAGNOSIS — E2749 Other adrenocortical insufficiency: Secondary | ICD-10-CM | POA: Diagnosis present

## 2011-09-06 DIAGNOSIS — G4700 Insomnia, unspecified: Secondary | ICD-10-CM | POA: Diagnosis present

## 2011-09-06 DIAGNOSIS — Z8673 Personal history of transient ischemic attack (TIA), and cerebral infarction without residual deficits: Secondary | ICD-10-CM

## 2011-09-06 DIAGNOSIS — D649 Anemia, unspecified: Secondary | ICD-10-CM | POA: Diagnosis present

## 2011-09-06 DIAGNOSIS — R930 Abnormal findings on diagnostic imaging of skull and head, not elsewhere classified: Secondary | ICD-10-CM

## 2011-09-06 DIAGNOSIS — J309 Allergic rhinitis, unspecified: Secondary | ICD-10-CM

## 2011-09-06 DIAGNOSIS — R0902 Hypoxemia: Secondary | ICD-10-CM

## 2011-09-06 DIAGNOSIS — Z7901 Long term (current) use of anticoagulants: Secondary | ICD-10-CM

## 2011-09-06 DIAGNOSIS — I509 Heart failure, unspecified: Secondary | ICD-10-CM | POA: Diagnosis present

## 2011-09-06 DIAGNOSIS — Z515 Encounter for palliative care: Secondary | ICD-10-CM

## 2011-09-06 DIAGNOSIS — J189 Pneumonia, unspecified organism: Secondary | ICD-10-CM | POA: Diagnosis present

## 2011-09-06 DIAGNOSIS — E785 Hyperlipidemia, unspecified: Secondary | ICD-10-CM | POA: Diagnosis present

## 2011-09-06 DIAGNOSIS — A419 Sepsis, unspecified organism: Principal | ICD-10-CM | POA: Diagnosis present

## 2011-09-06 DIAGNOSIS — E119 Type 2 diabetes mellitus without complications: Secondary | ICD-10-CM | POA: Diagnosis present

## 2011-09-06 DIAGNOSIS — R6521 Severe sepsis with septic shock: Secondary | ICD-10-CM | POA: Diagnosis present

## 2011-09-06 DIAGNOSIS — I5020 Unspecified systolic (congestive) heart failure: Secondary | ICD-10-CM | POA: Diagnosis present

## 2011-09-06 DIAGNOSIS — R079 Chest pain, unspecified: Secondary | ICD-10-CM | POA: Diagnosis present

## 2011-09-06 DIAGNOSIS — Z85038 Personal history of other malignant neoplasm of large intestine: Secondary | ICD-10-CM

## 2011-09-06 DIAGNOSIS — J45909 Unspecified asthma, uncomplicated: Secondary | ICD-10-CM

## 2011-09-06 DIAGNOSIS — J449 Chronic obstructive pulmonary disease, unspecified: Secondary | ICD-10-CM | POA: Diagnosis present

## 2011-09-06 DIAGNOSIS — I10 Essential (primary) hypertension: Secondary | ICD-10-CM

## 2011-09-06 DIAGNOSIS — R5381 Other malaise: Secondary | ICD-10-CM | POA: Diagnosis present

## 2011-09-06 DIAGNOSIS — R7989 Other specified abnormal findings of blood chemistry: Secondary | ICD-10-CM

## 2011-09-06 DIAGNOSIS — J690 Pneumonitis due to inhalation of food and vomit: Secondary | ICD-10-CM | POA: Diagnosis present

## 2011-09-06 DIAGNOSIS — I482 Chronic atrial fibrillation, unspecified: Secondary | ICD-10-CM | POA: Diagnosis present

## 2011-09-06 DIAGNOSIS — R609 Edema, unspecified: Secondary | ICD-10-CM

## 2011-09-06 DIAGNOSIS — F411 Generalized anxiety disorder: Secondary | ICD-10-CM | POA: Diagnosis present

## 2011-09-06 DIAGNOSIS — J4489 Other specified chronic obstructive pulmonary disease: Secondary | ICD-10-CM | POA: Diagnosis present

## 2011-09-06 DIAGNOSIS — J9601 Acute respiratory failure with hypoxia: Secondary | ICD-10-CM | POA: Diagnosis present

## 2011-09-06 DIAGNOSIS — J9600 Acute respiratory failure, unspecified whether with hypoxia or hypercapnia: Secondary | ICD-10-CM | POA: Diagnosis present

## 2011-09-06 DIAGNOSIS — I214 Non-ST elevation (NSTEMI) myocardial infarction: Secondary | ICD-10-CM | POA: Diagnosis present

## 2011-09-06 HISTORY — DX: Shortness of breath: R06.02

## 2011-09-06 LAB — DIFFERENTIAL
Basophils Absolute: 0.1 10*3/uL (ref 0.0–0.1)
Basophils Relative: 0 % (ref 0–1)
Eosinophils Absolute: 0.2 10*3/uL (ref 0.0–0.7)
Eosinophils Relative: 2 % (ref 0–5)
Monocytes Absolute: 0.5 10*3/uL (ref 0.1–1.0)
Monocytes Relative: 4 % (ref 3–12)

## 2011-09-06 LAB — URINALYSIS, ROUTINE W REFLEX MICROSCOPIC
Bilirubin Urine: NEGATIVE
Ketones, ur: NEGATIVE mg/dL
Leukocytes, UA: NEGATIVE
Nitrite: NEGATIVE
Protein, ur: 30 mg/dL — AB
Specific Gravity, Urine: 1.024 (ref 1.005–1.030)
Urobilinogen, UA: 0.2 mg/dL (ref 0.0–1.0)

## 2011-09-06 LAB — COMPREHENSIVE METABOLIC PANEL
Alkaline Phosphatase: 86 U/L (ref 39–117)
BUN: 25 mg/dL — ABNORMAL HIGH (ref 6–23)
CO2: 24 mEq/L (ref 19–32)
Calcium: 9.6 mg/dL (ref 8.4–10.5)
GFR calc Af Amer: 58 mL/min — ABNORMAL LOW (ref >90)
GFR calc non Af Amer: 50 mL/min — ABNORMAL LOW (ref >90)
Glucose, Bld: 169 mg/dL — ABNORMAL HIGH (ref 70–99)
Potassium: 3.6 mEq/L (ref 3.5–5.1)
Total Protein: 7.1 g/dL (ref 6.0–8.3)

## 2011-09-06 LAB — LACTIC ACID, PLASMA: Lactic Acid, Venous: 3.7 mmol/L — ABNORMAL HIGH (ref 0.5–2.2)

## 2011-09-06 LAB — CBC
HCT: 31.7 % — ABNORMAL LOW (ref 39.0–52.0)
Hemoglobin: 10.3 g/dL — ABNORMAL LOW (ref 13.0–17.0)
MCH: 26.8 pg (ref 26.0–34.0)
MCHC: 32.5 g/dL (ref 30.0–36.0)
MCV: 82.3 fL (ref 78.0–100.0)
RDW: 18.5 % — ABNORMAL HIGH (ref 11.5–15.5)

## 2011-09-06 LAB — CARDIAC PANEL(CRET KIN+CKTOT+MB+TROPI)
CK, MB: 8.7 ng/mL (ref 0.3–4.0)
Relative Index: 4.7 — ABNORMAL HIGH (ref 0.0–2.5)
Relative Index: 6 — ABNORMAL HIGH (ref 0.0–2.5)
Total CK: 130 U/L (ref 7–232)
Troponin I: 1.73 ng/mL (ref <0.30)

## 2011-09-06 LAB — URINE MICROSCOPIC-ADD ON

## 2011-09-06 LAB — MRSA PCR SCREENING: MRSA by PCR: NEGATIVE

## 2011-09-06 LAB — GRAM STAIN

## 2011-09-06 LAB — GLUCOSE, CAPILLARY: Glucose-Capillary: 182 mg/dL — ABNORMAL HIGH (ref 70–99)

## 2011-09-06 LAB — PROCALCITONIN: Procalcitonin: <0.1 ng/mL

## 2011-09-06 LAB — MAGNESIUM: Magnesium: 2.1 mg/dL (ref 1.5–2.5)

## 2011-09-06 LAB — APTT: aPTT: 55 seconds — ABNORMAL HIGH (ref 24–37)

## 2011-09-06 MED ORDER — ALBUTEROL SULFATE (5 MG/ML) 0.5% IN NEBU
5.0000 mg | INHALATION_SOLUTION | Freq: Once | RESPIRATORY_TRACT | Status: AC
  Administered 2011-09-06: 2.5 mg via RESPIRATORY_TRACT

## 2011-09-06 MED ORDER — IPRATROPIUM BROMIDE 0.02 % IN SOLN
0.5000 mg | Freq: Once | RESPIRATORY_TRACT | Status: AC
  Administered 2011-09-06: 0.5 mg via RESPIRATORY_TRACT

## 2011-09-06 MED ORDER — ALBUTEROL SULFATE (5 MG/ML) 0.5% IN NEBU
INHALATION_SOLUTION | RESPIRATORY_TRACT | Status: AC
  Administered 2011-09-06: 2.5 mg via RESPIRATORY_TRACT
  Filled 2011-09-06: qty 3

## 2011-09-06 MED ORDER — ALBUTEROL SULFATE (5 MG/ML) 0.5% IN NEBU
5.0000 mg | INHALATION_SOLUTION | RESPIRATORY_TRACT | Status: AC
  Administered 2011-09-06: 5 mg via RESPIRATORY_TRACT
  Filled 2011-09-06: qty 1
  Filled 2011-09-06 (×2): qty 0.5

## 2011-09-06 MED ORDER — METHYLPREDNISOLONE SODIUM SUCC 125 MG IJ SOLR
INTRAMUSCULAR | Status: AC
  Filled 2011-09-06: qty 2

## 2011-09-06 MED ORDER — SODIUM CHLORIDE 0.9 % IV BOLUS (SEPSIS)
250.0000 mL | Freq: Once | INTRAVENOUS | Status: AC
  Administered 2011-09-06: 1000 mL via INTRAVENOUS

## 2011-09-06 MED ORDER — ACETAMINOPHEN 325 MG PO TABS
650.0000 mg | ORAL_TABLET | Freq: Four times a day (QID) | ORAL | Status: DC | PRN
  Administered 2011-09-07 – 2011-09-15 (×4): 650 mg via ORAL
  Filled 2011-09-06 (×2): qty 1
  Filled 2011-09-06 (×4): qty 2

## 2011-09-06 MED ORDER — THERA M PLUS PO TABS
1.0000 | ORAL_TABLET | Freq: Every day | ORAL | Status: DC
  Administered 2011-09-07: 1 via ORAL
  Filled 2011-09-06 (×3): qty 1

## 2011-09-06 MED ORDER — ALBUTEROL SULFATE (5 MG/ML) 0.5% IN NEBU
2.5000 mg | INHALATION_SOLUTION | RESPIRATORY_TRACT | Status: DC | PRN
  Administered 2011-09-07 – 2011-09-08 (×3): 2.5 mg via RESPIRATORY_TRACT
  Filled 2011-09-06 (×3): qty 0.5

## 2011-09-06 MED ORDER — VANCOMYCIN HCL 1000 MG IV SOLR
750.0000 mg | Freq: Two times a day (BID) | INTRAVENOUS | Status: DC
  Administered 2011-09-07: 750 mg via INTRAVENOUS
  Filled 2011-09-06 (×2): qty 750

## 2011-09-06 MED ORDER — PIPERACILLIN-TAZOBACTAM 3.375 G IVPB
3.3750 g | Freq: Three times a day (TID) | INTRAVENOUS | Status: DC
  Administered 2011-09-07 – 2011-09-15 (×25): 3.375 g via INTRAVENOUS
  Filled 2011-09-06 (×29): qty 50

## 2011-09-06 MED ORDER — PIPERACILLIN-TAZOBACTAM 3.375 G IVPB
3.3750 g | Freq: Once | INTRAVENOUS | Status: AC
  Administered 2011-09-06: 3.375 g via INTRAVENOUS
  Filled 2011-09-06: qty 50

## 2011-09-06 MED ORDER — ONDANSETRON HCL 4 MG/2ML IJ SOLN: 4.0000 mg | Freq: Four times a day (QID) | INTRAMUSCULAR | Status: DC | PRN

## 2011-09-06 MED ORDER — ACETAMINOPHEN 650 MG RE SUPP: 650.0000 mg | Freq: Four times a day (QID) | RECTAL | Status: DC | PRN

## 2011-09-06 MED ORDER — VANCOMYCIN HCL IN DEXTROSE 1-5 GM/200ML-% IV SOLN
1000.0000 mg | Freq: Once | INTRAVENOUS | Status: AC
  Administered 2011-09-06: 1000 mg via INTRAVENOUS
  Filled 2011-09-06: qty 200

## 2011-09-06 MED ORDER — FLUTICASONE PROPIONATE 50 MCG/ACT NA SUSP
1.0000 | Freq: Every day | NASAL | Status: DC
  Filled 2011-09-06: qty 16

## 2011-09-06 MED ORDER — LEVOFLOXACIN IN D5W 750 MG/150ML IV SOLN
750.0000 mg | INTRAVENOUS | Status: DC
  Administered 2011-09-06 – 2011-09-10 (×3): 750 mg via INTRAVENOUS
  Filled 2011-09-06 (×3): qty 150

## 2011-09-06 MED ORDER — RIVAROXABAN 10 MG PO TABS: 20.0000 mg | ORAL_TABLET | Freq: Every day | ORAL | Status: DC

## 2011-09-06 MED ORDER — ASPIRIN 81 MG PO CHEW
324.0000 mg | CHEWABLE_TABLET | Freq: Once | ORAL | Status: DC
  Filled 2011-09-06 (×2): qty 1

## 2011-09-06 MED ORDER — SENNA 8.6 MG PO TABS
2.0000 | ORAL_TABLET | Freq: Every day | ORAL | Status: DC | PRN
  Filled 2011-09-06: qty 2

## 2011-09-06 MED ORDER — SODIUM CHLORIDE 0.9 % IV SOLN
INTRAVENOUS | Status: DC
  Administered 2011-09-06: via INTRAVENOUS

## 2011-09-06 MED ORDER — SIMVASTATIN 10 MG PO TABS
10.0000 mg | ORAL_TABLET | Freq: Every day | ORAL | Status: DC
  Administered 2011-09-06 – 2011-09-19 (×12): 10 mg via ORAL
  Filled 2011-09-06 (×17): qty 1

## 2011-09-06 MED ORDER — PANTOPRAZOLE SODIUM 40 MG PO TBEC
40.0000 mg | DELAYED_RELEASE_TABLET | Freq: Two times a day (BID) | ORAL | Status: DC
  Administered 2011-09-06 – 2011-09-07 (×3): 40 mg via ORAL
  Filled 2011-09-06 (×4): qty 1

## 2011-09-06 MED ORDER — FLUTICASONE PROPIONATE 50 MCG/ACT NA SUSP: 2.0000 | Freq: Every day | NASAL | Status: DC

## 2011-09-06 MED ORDER — DEXTROSE 5 % IV SOLN: 150.0000 mg | Freq: Once | INTRAVENOUS | Status: DC

## 2011-09-06 MED ORDER — IPRATROPIUM BROMIDE 0.02 % IN SOLN
0.5000 mg | Freq: Four times a day (QID) | RESPIRATORY_TRACT | Status: DC
  Administered 2011-09-06 – 2011-09-18 (×45): 0.5 mg via RESPIRATORY_TRACT
  Filled 2011-09-06 (×79): qty 2.5

## 2011-09-06 MED ORDER — IPRATROPIUM BROMIDE 0.02 % IN SOLN
0.5000 mg | Freq: Once | RESPIRATORY_TRACT | Status: AC
  Administered 2011-09-06: 0.5 mg via RESPIRATORY_TRACT
  Filled 2011-09-06: qty 2.5

## 2011-09-06 MED ORDER — FLUTICASONE-SALMETEROL 500-50 MCG/DOSE IN AEPB
1.0000 | INHALATION_SPRAY | Freq: Two times a day (BID) | RESPIRATORY_TRACT | Status: DC
  Administered 2011-09-06 – 2011-09-08 (×4): 1 via RESPIRATORY_TRACT
  Filled 2011-09-06: qty 14

## 2011-09-06 MED ORDER — RIVAROXABAN 15 MG PO TABS
15.0000 mg | ORAL_TABLET | Freq: Every day | ORAL | Status: DC
  Administered 2011-09-07: 15 mg via ORAL
  Filled 2011-09-06: qty 1

## 2011-09-06 MED ORDER — CALCIUM CARBONATE-VITAMIN D 500-200 MG-UNIT PO TABS
1.0000 | ORAL_TABLET | Freq: Every day | ORAL | Status: DC
  Administered 2011-09-07: 1 via ORAL
  Filled 2011-09-06 (×3): qty 1

## 2011-09-06 MED ORDER — ASPIRIN 81 MG PO CHEW
CHEWABLE_TABLET | ORAL | Status: AC
  Filled 2011-09-06: qty 4

## 2011-09-06 MED ORDER — ONDANSETRON HCL 4 MG PO TABS: 4.0000 mg | ORAL_TABLET | Freq: Four times a day (QID) | ORAL | Status: DC | PRN

## 2011-09-06 MED ORDER — SIMVASTATIN 5 MG PO TABS: 5.0000 mg | ORAL_TABLET | Freq: Every day | ORAL | Status: DC

## 2011-09-06 MED ORDER — ASPIRIN 325 MG PO TABS: 325.0000 mg | ORAL_TABLET | Freq: Every day | ORAL | Status: DC

## 2011-09-06 MED ORDER — ACETAMINOPHEN 325 MG PO TABS
650.0000 mg | ORAL_TABLET | Freq: Once | ORAL | Status: AC
  Administered 2011-09-06: 325 mg via ORAL

## 2011-09-06 MED ORDER — ALBUTEROL SULFATE (5 MG/ML) 0.5% IN NEBU
5.0000 mg | INHALATION_SOLUTION | RESPIRATORY_TRACT | Status: AC
  Administered 2011-09-06: 5 mg via RESPIRATORY_TRACT

## 2011-09-06 NOTE — ED Notes (Signed)
Received patient and assumed care, pt presents with c/o SOB, denies CP, N/V, received patient with 20g to left hand, with NS infusing without difficulty

## 2011-09-06 NOTE — ED Notes (Signed)
PT MEDICATED AS ORDER WITH TYLENOL FOR C/O MIDSTERNUM CHEST PAIN, DR. NA INFORMED, REPEAT EKG DONE, 02 0N AT 2L/M VIA [place] 02 SAT 97

## 2011-09-06 NOTE — ED Provider Notes (Signed)
I saw and evaluated the patient, reviewed the resident's note and I agree with the findings and plan. Patient alert, calm, in complaining of cough with shortness of breath, gradually worse over 3 days time. He is debilitated and has been able to eat and ambulate at home. Evaluation  today, consistent with HCAP. Patient is hypotensive and hypoxic. He is moderately ill. He does not appear to have severe sepsis. Lung exam reveals generalized wheezing, likely indicative of a bronchitis, exacerbation. Patient will be admitted to a step down unit and monitored closely. He did receive antibiotics in the ED for suspected pneumonia.  Flint Melter, MD 09/06/11 1315

## 2011-09-06 NOTE — ED Provider Notes (Signed)
History    CSN: [id number] Arrival date & time: 09/06/2011  9:13 AM   None     Chief Complaint  Patient presents with  . Shortness of Breath  . Chest Pain    (Consider location/radiation/quality/duration/timing/severity/associated sxs/prior treatment) HPI This 77 year old gentleman with past medical history of COPD/PNA/asthma, Bronchiatrial fibrillation, hypertension, hyperlipidemia and colon cancer who presented to ER with shortness breath and chest pain.  this history was provided by patient and his wife. Patient states that he started mild to moderate shortness breath and cough one week ago. Gradual onset, Small amount Yellowish sputum. Patient has history of COPD and been taking his regular medications. Patient thought his shortness breath was due to his COPD and did not seek any medical attention. Patient states his shortness of breath and cough were worsening since last night. He could not sit still or lying down.  Moderate yellowish sputum noted without hemoptysis. Activity makes it worse and no alleviating factors. And he started to feel mild chest aching worsened with cough and mild nausea without vomiting. nO R  Denies fever, chills or sore throat. Denies chest pressure or palpitation. Denies abdomen pain and vomiting. Denies weakness, tingling or numbness.Denies dysuria, urinary frequency or urgency. Denies sick contact or recent travel. The patient was given albuterol and Atrovent neb treatment and Solu-Medrol IV by EMS before arriving at [facility] emergency room. Patient's oxygen saturation was 86% and increased to 96% after treatment.  Of note, patient was discharged on 08/17 2012 for acute infarction and sent to SNF for 3 weeks. Pt was discharged from SNF two weeks ago.    Past Medical History  Diagnosis Date  . Asthma   . Hypertension   . Diabetes mellitus   . Cancer   . A-fib   . Hyperlipidemia   . Colon cancer     History reviewed. No pertinent past surgical  history.  History reviewed. No pertinent family history.  History  Substance Use Topics  . Smoking status: Not on file  . Smokeless tobacco: Not on file  . Alcohol Use: No      Review of Systems See HPI  Allergies  Review of patient's allergies indicates no known allergies.  Home Medications   Current Outpatient Rx  Name Route Sig Dispense Refill  . ALBUTEROL SULFATE HFA 108 (90 BASE) MCG/ACT IN AERS Inhalation Inhale 2 puffs into the lungs every 6 (six) hours as needed.      Marland Kitchen AMLODIPINE BESYLATE 5 MG PO TABS Oral Take 5 mg by mouth daily.      Marland Kitchen HCTZ 12.5 mg oral daily    . ASA 325mg  Oral  daily    . FLUTICASONE PROPIONATE 50 MCG/ACT Talor Cheema SUSP Nasal Place 2 sprays into the nose daily.      Marland Kitchen PRAVASTATIN SODIUM 20 MG PO TABS Oral Take 20 mg by mouth daily.      . Benecar  40 mg Oral  daily         K Dur 20 Meg po daily      Oscal 500 mg po 2 tabs daily with meds     Thera-M tab 1 tab daily      advair 500/50 1 puff BID     Protonix 40 mg po BID         Physical Exam BP 143/76  Pulse 112  Temp(Src) 96.9 F (36.1 C) (Oral)  Resp 32  SpO2 96%  General: Moderate distress. alert, well-developed, and cooperative to examination.  Head: normocephalic and atraumatic.  Eyes: vision grossly intact, pupils equal, pupils round, pupils reactive to light, no injection and anicteric.  Left some pus drainage noted. Mouth: pharynx pink and moist, no erythema, and no exudates.  Neck: supple, full ROM, no thyromegaly, no JVD, and no carotid bruits.  Lungs: excess respiratory effort, bilateral lung sounds coarse. Bilateral posterior lungs crackles noted with occasional expiratory wheezing. Heart: Irregularly irregular, no murmur, no gallop, and no rub.  Abdomen: soft, non-tender, normal bowel sounds, no distention, no guarding, no rebound tenderness, no hepatomegaly, and no splenomegaly.  Msk: no joint swelling, no joint warmth, and no redness over joints.  Pulses: 1+ DP/PT pulses  bilaterally Extremities: No cyanosis, clubbing, edema Neurologic: alert & oriented X3, cranial nerves II-XII intact, strength normal in all extremities, sensation intact to light touch, and gait normal.  Skin: turgor normal and no rashes.  Psych: Oriented X3, short-term memory deficit, normally interactive, good eye contact.    09/06/2011  PORTABLE CHEST - 1 VIEW  Comparison: Portable chest x-ray of 06/18/2011  Findings: There is poorly defined airspace disease right greater than left.  Pneumonia cannot be excluded with edema less likely in view of the asymmetry and peripheral distribution.  Cardiomegaly is stable.  No effusion is seen.  IMPRESSION: Patchy airspace disease bilaterally.  Favor multifocal pneumonia.             ED Course  Procedures   Date: 09/06/2011  Rate:108  Rhythm: atrial fibrillation  QRS Axis: left  Intervals: A Fib, QT prolonged  ST/T Wave abnormalities: normal  Conduction Disutrbances:none  Narrative Interpretation:   Old EKG Reviewed: changes noted     MDM  1. HCAP      Clinical manifestation is consistent with pneumonia. Given the history of recent discharge from SNF, will consider treat patient for hospital-acquired pneumonia.     Plan-Will check Pan cultures            -start IV Vancomycin and Zosyn            - Symptomatic treatment with Neb and Oxygen            -  IV Steroids            - admit to inpt service 2. Sepsis     Pt presents with acute infection and hypotension with BP 90's/60's. Pt baseline BP ranges 130's/70's. Of note, pt did not take his medications today.     Plan- NS gentle replacement            Continue to monitor BP.              3. CP r/o MI     Pt presents with SOB, cough with atypical chest pain. The probability of cardiac event is low. However, given his extensive h/o HTN, DM, and hyperlipidemia     - will cycle  his CK and troponin     - check BNP     -May repeat EKG if needed.  3.A Fib    Pt has been on  pradaxa for Afib and has had acute infarction in August, 12. His predexa was stopped and Xarelto was ordered for CVA prophylaxis based on his last D/C summary.   However, Pt and his family do not remember this medication. Will clarify it. 4. COPD     This is his chronic problem.     - will give Ned treatment     -IV steroids  Dede Query, MD Resident 09/06/11 [id number]

## 2011-09-06 NOTE — ED Notes (Signed)
Pt here by ems for chest pain and sob. withems given 15 albuterol and 1 atrovent and then 125 solumedrol started yesterday feels a heaviness in chest, did attempt inhaler use with no improvement, labored on arrival. Initial oxygen with ems 86 % and then after tx came to 94 %. Pt with occasional pvc on monitors

## 2011-09-06 NOTE — ED Notes (Signed)
Ekg done and old ekg found in muse and given to Dr. Wentz. 

## 2011-09-06 NOTE — Progress Notes (Signed)
ANTIBIOTIC CONSULT NOTE - INITIAL  Pharmacy Consult for vancomycin, zosyn + levaquin Indication: suspected pneumonia  No Known Allergies  Patient Measurements:   Adjusted Body Weight:   Vital Signs: Temp: 97.6 F (36.4 C) (11/05 1631) Temp src: Oral (11/05 1631) BP: 106/58 mmHg (11/05 1631) Pulse Rate: 99  (11/05 1631)  Labs:  Basename 09/06/11 1145  WBC 13.7*  HGB 10.3*  PLT 329  LABCREA --  CREATININE 1.26   The CrCl is unknown because both a height and weight (above a minimum accepted value) are required for this calculation. No results found for this basename: VANCOTROUGH:2,VANCOPEAK:2,VANCORANDOM:2,GENTTROUGH:2,GENTPEAK:2,GENTRANDOM:2,TOBRATROUGH:2,TOBRAPEAK:2,TOBRARND:2,AMIKACINPEAK:2,AMIKACINTROU:2,AMIKACIN:2, in the last 72 hours   Microbiology: Recent Results (from the past 720 hour(s))  GRAM STAIN     Status: Normal   Collection Time   09/06/11 12:02 PM      Component Value Range Status Comment   Specimen Description SPUTUM   Final    Special Requests NONE   Final    Gram Stain     Final    Value: FEW WBC PRESENT,BOTH PMN AND MONONUCLEAR     ABUNDANT GRAM POSITIVE COCCI IN PAIRS IN CLUSTERS     ABUNDANT GRAM POSITIVE COCCI IN PAIRS IN CHAINS     RARE GRAM POSITIVE RODS     RARE GRAM NEGATIVE RODS   Report Status 09/06/2011 FINAL   Final     Medical History: Past Medical History  Diagnosis Date  . Asthma   . Hypertension   . Diabetes mellitus   . Cancer   . A-fib   . Hyperlipidemia   . Colon cancer     Medications:  Prescriptions prior to admission  Medication Sig Dispense Refill  . albuterol (PROVENTIL HFA;VENTOLIN HFA) 108 (90 BASE) MCG/ACT inhaler Inhale 2 puffs into the lungs every 6 (six) hours as needed.        Marland Kitchen amLODipine (NORVASC) 5 MG tablet Take 5 mg by mouth daily.        Marland Kitchen aspirin 325 MG tablet Take 325 mg by mouth daily.        . calcium-vitamin D (OSCAL WITH D) 500-200 MG-UNIT per tablet Take 1 tablet by mouth daily.        .  candesartan-hydrochlorothiazide (ATACAND HCT) 32-12.5 MG per tablet Take 1 tablet by mouth daily.        . dabigatran (PRADAXA) 150 MG CAPS Take 150 mg by mouth every 12 (twelve) hours.        . fluticasone (FLONASE) 50 MCG/ACT nasal spray Place 2 sprays into the nose daily.       . Fluticasone-Salmeterol (ADVAIR) 500-50 MCG/DOSE AEPB Inhale 1 puff into the lungs every 12 (twelve) hours.       . Multiple Vitamins-Minerals (MULTIVITAMINS THER. W/MINERALS) TABS Take 1 tablet by mouth daily.        . pravastatin (PRAVACHOL) 20 MG tablet Take 20 mg by mouth daily.        Marland Kitchen DISCONTD: albuterol (PROVENTIL) (2.5 MG/3ML) 0.083% nebulizer solution Take 2.5 mg by nebulization every 2 (two) hours as needed.        Marland Kitchen DISCONTD: hydrochlorothiazide (MICROZIDE) 12.5 MG capsule Take 12.5 mg by mouth daily.        Marland Kitchen DISCONTD: olmesartan (BENICAR) 40 MG tablet Take 40 mg by mouth daily.        Marland Kitchen DISCONTD: pantoprazole (PROTONIX) 40 MG tablet Take 40 mg by mouth 2 (two) times daily.        Marland Kitchen DISCONTD: potassium chloride (MICRO-K)  10 MEQ CR capsule Take 20 mEq by mouth 2 (two) times daily.        Marland Kitchen DISCONTD: rivaroxaban (XARELTO) 10 MG TABS tablet Take 20 mg by mouth daily.       Marland Kitchen DISCONTD: UNKNOWN TO PATIENT         Assessment: Pt admitted with cough and shortness of breath x 1 week. He was recently discharged to home from SNF. Will start broad-spectrum antibiotics for possible pneumonia   Goal of Therapy:  Vancomycin trough level 15-20 mcg/ml  Plan:  Measure antibiotic drug levels at steady state Follow up culture results Zosyn 3.375gm IV Q8H, vancomycin 750mg  IV Q12H, levaquin 750mg  IV Q48H, f/u most recent height and weight  Shadia Larose, Drake Leach 09/06/2011,5:31 PM

## 2011-09-06 NOTE — ED Notes (Signed)
PT MEDICATED AS ORDER WITH 324 MG ASPIRIN

## 2011-09-06 NOTE — ED Notes (Signed)
Patient BIBEMS and assumed care, pt presents SOB on nonrebreather, pt denies CP and N/V, pt given breathing treatment, MD made aware

## 2011-09-06 NOTE — Progress Notes (Signed)
ANTICOAGULATION CONSULT NOTE - Initial Consult  Pharmacy Consult for Rivaroxaban Indication: atrial fibrillation  No Known Allergies  Patient Measurements: Height: 6' (182.9 cm) Weight: 161 lb (73.029 kg) IBW/kg (Calculated) : 77.6  Adjusted Body Weight:   Vital Signs: Temp: 97.9 F (36.6 C) (11/05 1807) Temp src: Oral (11/05 1807) BP: 124/71 mmHg (11/05 1807) Pulse Rate: 97  (11/05 1807)  Labs:  Basename 09/06/11 1754 09/06/11 1145  HGB -- 10.3*  HCT -- 31.7*  PLT -- 329  APTT -- Todd*  LABPROT -- 18.4*  INR -- 1.50*  HEPARINUNFRC -- --  CREATININE -- 1.26  CKTOTAL 144 130  CKMB 8.7* 6.1*  TROPONINI 1.73* 1.09*   Estimated Creatinine Clearance: 44.3 ml/min (by C-G formula based on Cr of 1.26).  Medical History: Past Medical History  Diagnosis Date  . Asthma   . Hypertension   . Diabetes mellitus   . Cancer   . A-fib   . Hyperlipidemia   . Colon cancer   . Shortness of breath     Medications:  Prescriptions prior to admission  Medication Sig Dispense Refill  . albuterol (PROVENTIL HFA;VENTOLIN HFA) 108 (90 BASE) MCG/ACT inhaler Inhale 2 puffs into the lungs every 6 (six) hours as needed.        Marland Kitchen amLODipine (NORVASC) 5 MG tablet Take 5 mg by mouth daily.        Marland Kitchen aspirin 325 MG tablet Take 325 mg by mouth daily.       . calcium-vitamin D (OSCAL WITH D) 500-200 MG-UNIT per tablet Take 1 tablet by mouth daily.        . candesartan-hydrochlorothiazide (ATACAND HCT) 32-12.5 MG per tablet Take 1 tablet by mouth daily.        . dabigatran (PRADAXA) 150 MG CAPS Take 150 mg by mouth every 12 (twelve) hours.       . fluticasone (FLONASE) 50 MCG/ACT nasal spray Place 2 sprays into the nose daily.       . Fluticasone-Salmeterol (ADVAIR) 500-50 MCG/DOSE AEPB Inhale 1 puff into the lungs every 12 (twelve) hours.       . Multiple Vitamins-Minerals (MULTIVITAMINS THER. W/MINERALS) TABS Take 1 tablet by mouth daily.        . pravastatin (PRAVACHOL) 20 MG tablet Take 20 mg  by mouth daily.        Marland Kitchen DISCONTD: albuterol (PROVENTIL) (2.5 MG/3ML) 0.083% nebulizer solution Take 2.5 mg by nebulization every 2 (two) hours as needed.        Marland Kitchen DISCONTD: hydrochlorothiazide (MICROZIDE) 12.5 MG capsule Take 12.5 mg by mouth daily.        Marland Kitchen DISCONTD: olmesartan (BENICAR) 40 MG tablet Take 40 mg by mouth daily.        Marland Kitchen DISCONTD: pantoprazole (PROTONIX) 40 MG tablet Take 40 mg by mouth 2 (two) times daily.        Marland Kitchen DISCONTD: potassium chloride (MICRO-K) 10 MEQ CR capsule Take 20 mEq by mouth 2 (two) times daily.        Marland Kitchen DISCONTD: rivaroxaban (XARELTO) 10 MG TABS tablet Take 20 mg by mouth daily.       Marland Kitchen DISCONTD: UNKNOWN TO PATIENT          Assessment: Todd [name]  Presents with cough and shortness of breath.  He was recently hospitalized in August for stroke and then to SNF for rehab.  Appears recommendation was to change his pradaxa to xarelto for stroke prevention. Appears he had stroke while on pradaxa.  Cardiology has written to start xarelto with pharmacy to assist with dosing for renal function   Plan:  1.  Based on his current est CrCl = 61ml/min, continue with ordered dose of xarelto 15mg  po daily.  Monitor renal function, CBC, and signs of bleeding.   Dannielle Huh 09/06/2011,7:25 PM

## 2011-09-06 NOTE — ED Notes (Signed)
Pt medicated as order, pt resting quietly with eyes closed 02 sat 96% on 2l/m, no adverse reaction noted from ABT, family at bedside

## 2011-09-06 NOTE — H&P (Signed)
PCP:  No primary provider on file.   DOA:  09/06/2011  9:13 AM  Chief Complaint:  Cough and shortness of breath.  HPI: Todd [name] is an 77 years old Caucasian man with with multiple comorbidities who was last hospitalized in August 2012 for stroke and was subsequently discharged to skilled nursing facility. He was discharged from the skilled nursing facility to home couples weeks ago. Was brought into the ER today with chief complaint of cough and shortness of breath for about a week. His cough is productive of yellowish sputum there was no reported fever or chills. Patient condition worsen  Last night and early this morning and he was brought into the ER today. Patient was also complaining of chest pain in the ED. He was initially found to be hypoxic his O2 sat was in the 80s on room air he was placed on oxygen with some improvement. He was also found to be hypotensive with a systolic blood pressure in the 80s on presentation and he was bolused with IV fluid with improvement in his systolic blood pressure to the 100. Initial workup in the ED including chest x-ray showed pneumonia and his troponin was elevated at 1.09. His EKG showed chronic atrial fibrillation with no significant changes in ST-T wave segments. We were asked  to admit the patient for further management.  Allergies: No Known Allergies  Prior to Admission medications   Medication Sig Start Date End Date Taking? Authorizing Provider  albuterol (PROVENTIL HFA;VENTOLIN HFA) 108 (90 BASE) MCG/ACT inhaler Inhale 2 puffs into the lungs every 6 (six) hours as needed.     Yes Historical Provider, MD  albuterol (PROVENTIL) (2.5 MG/3ML) 0.083% nebulizer solution Take 2.5 mg by nebulization every 2 (two) hours as needed.     Yes Historical Provider, MD  amLODipine (NORVASC) 5 MG tablet Take 5 mg by mouth daily.     Yes Historical Provider, MD  aspirin 325 MG tablet Take 325 mg by mouth daily.     Yes Historical Provider, MD  calcium-vitamin D  (OSCAL WITH D) 500-200 MG-UNIT per tablet Take 1 tablet by mouth daily.     Yes Historical Provider, MD  dabigatran (PRADAXA) 150 MG CAPS Take 150 mg by mouth every 12 (twelve) hours.     Yes Historical Provider, MD  fluticasone (FLONASE) 50 MCG/ACT nasal spray Place 2 sprays into the nose daily.     Yes Historical Provider, MD  Fluticasone-Salmeterol (ADVAIR) 500-50 MCG/DOSE AEPB Inhale 1 puff into the lungs every 12 (twelve) hours.     Yes Historical Provider, MD  hydrochlorothiazide (MICROZIDE) 12.5 MG capsule Take 12.5 mg by mouth daily.     Yes Historical Provider, MD  Multiple Vitamins-Minerals (MULTIVITAMINS THER. W/MINERALS) TABS Take 1 tablet by mouth daily.     Yes Historical Provider, MD  olmesartan (BENICAR) 40 MG tablet Take 40 mg by mouth daily.     Yes Historical Provider, MD  pantoprazole (PROTONIX) 40 MG tablet Take 40 mg by mouth 2 (two) times daily.     Yes Historical Provider, MD  potassium chloride (MICRO-K) 10 MEQ CR capsule Take 20 mEq by mouth 2 (two) times daily.     Yes Historical Provider, MD  pravastatin (PRAVACHOL) 20 MG tablet Take 20 mg by mouth daily.     Yes Historical Provider, MD  candesartan-hydrochlorothiazide (ATACAND HCT) 32-12.5 MG per tablet Take 1 tablet by mouth daily.      Historical Provider, MD  rivaroxaban (XARELTO) 10 MG TABS tablet  Take 20 mg by mouth daily.      Historical Provider, MD  UNKNOWN TO PATIENT      Historical Provider, MD    Past Medical History  Diagnosis Date  . Asthma   . Hypertension   . Diabetes mellitus   . Cancer   . A-fib   . Hyperlipidemia   . Colon cancer status post resection    Stroke  Anemia  CHF.diastolic  EF 60-65% in March 2012 BPH. Non insulin dependent diabetes mellitus ,not on meidcation. Lung nodule    Social History: He lives with his wife have been, denies any smoking for the last 40 years. Denies any EtOH or illicit drug use.    Review of Systems:  Constitutional: Denies fever, chills,  diaphoresis, appetite change and fatigue.  Respiratory: C/O SOB, DOE, cough, chest tightness,  and wheezing.   Cardiovascular:C/O chest pain, no palpitations and leg swelling.  Gastrointestinal: Denies nausea, vomiting, abdominal pain, diarrhea, constipation, blood in stool and abdominal distention.  Genitourinary: Denies dysuria, urgency, frequency,      Physical Exam:  Filed Vitals:   09/06/11 1125 09/06/11 1209 09/06/11 1256 09/06/11 1440  BP: 89/47 100/60  108/57  Pulse: 103 104  70  Temp: 97.6 F (36.4 C) 97.7 F (36.5 C) 97.7 F (36.5 C) 97.9 F (36.6 C)  TempSrc: Oral Oral Rectal Oral  Resp: 24 28  32  SpO2: 95% 96%  96%    Constitutional: Vital signs reviewed.   Patient is alert ,in moderate  distress Head: Normocephalic and atraumatic Neck: Supple, Trachea midline normal ROM, No JVD, t.  Cardiovascular: irregular irregular rhythum Pulmonary/Chest:diffuse rales, scattered wheezes  Abdominal: Soft. Non-tender, non-distended, bowel sounds are normal, no masses, organomegaly, or guarding present.  Ext: no edema and no cyanosis, pulses palpable bilaterally  Neurological: A&,oriented x 2,moves all extremities  Labs on Admission:  Results for orders placed during the hospital encounter of 09/06/11 (from the past 48 hour(s))  GLUCOSE, CAPILLARY     Status: Abnormal   Collection Time   09/06/11 11:22 AM      Component Value Range Comment   Glucose-Capillary 182 (*) 70 - 99 (mg/dL)   CBC     Status: Abnormal   Collection Time   09/06/11 11:45 AM      Component Value Range Comment   WBC 13.7 (*) 4.0 - 10.5 (K/uL)    RBC 3.85 (*) 4.22 - 5.81 (MIL/uL)    Hemoglobin 10.3 (*) 13.0 - 17.0 (g/dL)    HCT 40.9 (*) 81.1 - 52.0 (%)    MCV 82.3  78.0 - 100.0 (fL)    MCH 26.8  26.0 - 34.0 (pg)    MCHC 32.5  30.0 - 36.0 (g/dL)    RDW 91.4 (*) 78.2 - 15.5 (%)    Platelets 329  150 - 400 (K/uL)   DIFFERENTIAL     Status: Abnormal   Collection Time   09/06/11 11:45 AM       Component Value Range Comment   Neutrophils Relative 91 (*) 43 - 77 (%)    Neutro Abs 12.4 (*) 1.7 - 7.7 (K/uL)    Lymphocytes Relative 4 (*) 12 - 46 (%)    Lymphs Abs 0.5 (*) 0.7 - 4.0 (K/uL)    Monocytes Relative 4  3 - 12 (%)    Monocytes Absolute 0.5  0.1 - 1.0 (K/uL)    Eosinophils Relative 2  0 - 5 (%)    Eosinophils Absolute 0.2  0.0 -  0.7 (K/uL)    Basophils Relative 0  0 - 1 (%)    Basophils Absolute 0.1  0.0 - 0.1 (K/uL)   COMPREHENSIVE METABOLIC PANEL     Status: Abnormal   Collection Time   09/06/11 11:45 AM      Component Value Range Comment   Sodium 141  135 - 145 (mEq/L)    Potassium 3.6  3.5 - 5.1 (mEq/L)    Chloride 102  96 - 112 (mEq/L)    CO2 24  19 - 32 (mEq/L)    Glucose, Bld 169 (*) 70 - 99 (mg/dL)    BUN 25 (*) 6 - 23 (mg/dL)    Creatinine, Ser 1.61  0.50 - 1.35 (mg/dL)    Calcium 9.6  8.4 - 10.5 (mg/dL)    Total Protein 7.1  6.0 - 8.3 (g/dL)    Albumin 3.5  3.5 - 5.2 (g/dL)    AST 17  0 - 37 (U/L)    ALT 9  0 - 53 (U/L)    Alkaline Phosphatase 86  39 - 117 (U/L)    Total Bilirubin 0.2 (*) 0.3 - 1.2 (mg/dL)    GFR calc non Af Amer 50 (*) >90 (mL/min)    GFR calc Af Amer 58 (*) >90 (mL/min)   CARDIAC PANEL(CRET KIN+CKTOT+MB+TROPI)     Status: Abnormal   Collection Time   09/06/11 11:45 AM      Component Value Range Comment   Total CK 130  7 - 232 (U/L)    CK, MB 6.1 (*) 0.3 - 4.0 (ng/mL)    Troponin I 1.09 (*) <0.30 (ng/mL)    Relative Index 4.7 (*) 0.0 - 2.5    PRO B NATRIURETIC PEPTIDE     Status: Abnormal   Collection Time   09/06/11 11:45 AM      Component Value Range Comment   BNP, POC 1546.0 (*) 0 - 450 (pg/mL)   PROTIME-INR     Status: Abnormal   Collection Time   09/06/11 11:45 AM      Component Value Range Comment   Prothrombin Time 18.4 (*) 11.6 - 15.2 (seconds)    INR 1.50 (*) 0.00 - 1.49    APTT     Status: Abnormal   Collection Time   09/06/11 11:45 AM      Component Value Range Comment   aPTT 55 (*) 24 - 37 (seconds)   LACTIC ACID,  PLASMA     Status: Abnormal   Collection Time   09/06/11 11:45 AM      Component Value Range Comment   Lactic Acid, Venous 3.7 (*) 0.5 - 2.2 (mmol/L)   PROCALCITONIN     Status: Normal   Collection Time   09/06/11 11:45 AM      Component Value Range Comment   Procalcitonin <0.10     MAGNESIUM     Status: Normal   Collection Time   09/06/11 11:45 AM      Component Value Range Comment   Magnesium 2.1  1.5 - 2.5 (mg/dL)   GRAM STAIN     Status: Normal   Collection Time   09/06/11 12:02 PM      Component Value Range Comment   Specimen Description SPUTUM      Special Requests NONE      Gram Stain        Value: FEW WBC PRESENT,BOTH PMN AND MONONUCLEAR     ABUNDANT GRAM POSITIVE COCCI IN PAIRS IN CLUSTERS  ABUNDANT GRAM POSITIVE COCCI IN PAIRS IN CHAINS     RARE GRAM POSITIVE RODS     RARE GRAM NEGATIVE RODS   Report Status 09/06/2011 FINAL     URINALYSIS, ROUTINE W REFLEX MICROSCOPIC     Status: Abnormal   Collection Time   09/06/11  2:03 PM      Component Value Range Comment   Color, Urine YELLOW  YELLOW     Appearance HAZY (*) CLEAR     Specific Gravity, Urine 1.024  1.005 - 1.030     pH 6.0  5.0 - 8.0     Glucose, UA NEGATIVE  NEGATIVE (mg/dL)    Hgb urine dipstick SMALL (*) NEGATIVE     Bilirubin Urine NEGATIVE  NEGATIVE     Ketones, ur NEGATIVE  NEGATIVE (mg/dL)    Protein, ur 30 (*) NEGATIVE (mg/dL)    Urobilinogen, UA 0.2  0.0 - 1.0 (mg/dL)    Nitrite NEGATIVE  NEGATIVE     Leukocytes, UA NEGATIVE  NEGATIVE    URINE MICROSCOPIC-ADD ON     Status: Abnormal   Collection Time   09/06/11  2:03 PM      Component Value Range Comment   WBC, UA 3-6  <3 (WBC/hpf)    RBC / HPF 7-10  <3 (RBC/hpf)    Bacteria, UA RARE  RARE     Casts HYALINE CASTS (*) NEGATIVE  GRANULAR CAST   Urine-Other MUCOUS PRESENT       Radiological Exams on Admission: CXR  IMPRESSION:  Patchy airspace disease bilaterally. Favor multifocal pneumonia.  Recommend follow-up two-view chest  x-ray   Assessment/Plan Principal Problem:  Acute respiratory failure with hypoxia  *Pneumonia,HCAP Active Problems:  Sepsis  Chest pain  Atrial fibrillation  Plan Patient will be admitted to the down unit. 2 sets of blood cultures were sent from the ED. Will continue with  vancomycin and Zosyn, add IV Levaquin to cover for it HCPAP. Continue nebs when necessary. O2 supplementation. [facility]  [facility] was consulted. We'll continue to cycle cardiac enzymes. Continue Xeralto ,not  On rate controlling agent for now ,given hypotension. Would hold all antihypertensives medication, continue IV fluid bolus as needed. Given history of recent stroke we'll consult speech and swallow team to evaluate to rule out aspiration. Patient has history of diabetes currently not in medication. Will monitor CBG and if needed we'll place on insulin sliding scale. CODE STATUS discussed with wife, patient is a full code.     Time Spent on Admission: 60 minutes   Parris Cudworth 09/06/2011, 4:19 PM

## 2011-09-06 NOTE — ED Notes (Signed)
Patient aware that we need urine specimen. Unable to go at this time. Urinal left at bedside.

## 2011-09-06 NOTE — Consults (Addendum)
Patient ID: KAEMON BARNETT MRN: [id number], DOB/AGE: 1924-06-15   Admit date: 09/06/2011 Date of Consult: @TODAY @  Primary Physician: No primary provider on file. Primary Cardiologist: Deborah Chalk  Pt. Profile: Asked to see regarding positive troponin.  Problem List: Past Medical History  Diagnosis Date  . Asthma   . Hypertension   . Diabetes mellitus   . Cancer   . A-fib   . Hyperlipidemia   . Colon cancer     History reviewed. No pertinent past surgical history.   Allergies: No Known Allergies  HPI:  Patient is an 77 year old with a history of diastolic CHF, chronic afib, CVA who presented today for cough, SOB with yellow sputum  Began about 1 wk ago.  In ER he wa hypoxic and hypotensive.  He noted mild chest pressure earlier today.  EKG showed afib with PVCs.  (unable to find).  He was given IV amiodarone x 1.  Started on IV ABX.   Labs significant for Troponin 1.09.  CK 130. WBC was 13.7.   The patient denies palpitations.   Inpatient Medications:    . acetaminophen  650 mg Oral Once  . albuterol  5 mg Nebulization Q4H  . albuterol  5 mg Nebulization Once  . albuterol  5 mg Nebulization Q4H  . albuterol  5 mg Nebulization Once  . aspirin      . aspirin  324 mg Oral Once  . aspirin  325 mg Oral Daily  . calcium-vitamin D  1 tablet Oral Daily  . fluticasone  1 spray Each Nare Daily  . Fluticasone-Salmeterol  1 puff Inhalation Q12H  . ipratropium  0.5 mg Nebulization Once  . ipratropium  0.5 mg Nebulization Once  . ipratropium  0.5 mg Nebulization Once  . ipratropium  0.5 mg Nebulization Q6H  . levofloxacin (LEVAQUIN) IV  750 mg Intravenous Q48H  . methylPREDNISolone sodium succinate      . multivitamins ther. w/minerals  1 tablet Oral Daily  . pantoprazole  40 mg Oral BID  . piperacillin-tazobactam (ZOSYN)  IV  3.375 g Intravenous Once  . piperacillin-tazobactam (ZOSYN)  IV  3.375 g Intravenous Q8H  . rivaroxaban  20 mg Oral Daily  . simvastatin  10 mg Oral q1800    . sodium chloride  250 mL Intravenous Once  . vancomycin  750 mg Intravenous Q12H  . vancomycin  1,000 mg Intravenous Once  . DISCONTD: amiodarone  150 mg Intravenous Once  . DISCONTD: fluticasone  2 spray Each Nare Daily  . DISCONTD: simvastatin  5 mg Oral q1800    History reviewed. No pertinent family history.   History   Social History  . Marital Status: Married    Spouse Name: N/A    Number of Children: N/A  . Years of Education: N/A   Occupational History  . Not on file.   Social History Main Topics  . Smoking status: Not on file  . Smokeless tobacco: Not on file  . Alcohol Use: No  . Drug Use: No  . Sexually Active:    Other Topics Concern  . Not on file   Social History Narrative  . No narrative on file     Review of Systems: General: negative for chills, fever, night sweats or weight changes.  Cardiovascular: negative for chest pain, dyspnea on exertion, edema, orthopnea, palpitations, paroxysmal nocturnal dyspnea or shortness of breath Dermatological: negative for rash Respiratory: negative for cough or wheezing Urologic: negative for hematuria Abdominal: negative for nausea,  vomiting, diarrhea, bright red blood per rectum, melena, or hematemesis Neurologic: negative for visual changes, syncope, or dizziness All other systems reviewed and are otherwise negative except as noted above.  Physical Exam: Filed Vitals:   09/06/11 1807  BP: 124/71  Pulse: 97  Temp: 97.9 F (36.6 C)  Resp: 28   No intake or output data in the 24 hours ending 09/06/11 1821 Patient is in NAD.  Denies CP.  Breathing is some beter. General: Well developed, well nourished, in no acute distress. Head: Normocephalic, atraumatic, sclera non-icteric, no  Neck: Negative for carotid bruits. JVD not elevated. Lungs:  Lower lung fields with pops, wheezes.  L with egophany. Heart:  Irregular rate and rhythm with S1 S2. No murmurs, rubs, or gallops appreciated. Abdomen: Soft,  non-tender, non-distended with normoactive bowel sounds. No hepatomegaly. No rebound/guarding. No obvious abdominal masses. Msk:  Strength and tone appears normal for age. Extremities: No clubbing, cyanosis or edema.  1+ posterior tibial   Feet sl cool. Neuro: Alert and oriented X 3. Moves all extremities spontaneously.  Otherwise deferred. Psych:  Responds to questions appropriately with a normal affect.  Labs: Results for orders placed during the hospital encounter of 09/06/11 (from the past 24 hour(s))  GLUCOSE, CAPILLARY     Status: Abnormal   Collection Time   09/06/11 11:22 AM      Component Value Range   Glucose-Capillary 182 (*) 70 - 99 (mg/dL)  CBC     Status: Abnormal   Collection Time   09/06/11 11:45 AM      Component Value Range   WBC 13.7 (*) 4.0 - 10.5 (K/uL)   RBC 3.85 (*) 4.22 - 5.81 (MIL/uL)   Hemoglobin 10.3 (*) 13.0 - 17.0 (g/dL)   HCT 16.1 (*) 09.6 - 52.0 (%)   MCV 82.3  78.0 - 100.0 (fL)   MCH 26.8  26.0 - 34.0 (pg)   MCHC 32.5  30.0 - 36.0 (g/dL)   RDW 04.5 (*) 40.9 - 15.5 (%)   Platelets 329  150 - 400 (K/uL)  DIFFERENTIAL     Status: Abnormal   Collection Time   09/06/11 11:45 AM      Component Value Range   Neutrophils Relative 91 (*) 43 - 77 (%)   Neutro Abs 12.4 (*) 1.7 - 7.7 (K/uL)   Lymphocytes Relative 4 (*) 12 - 46 (%)   Lymphs Abs 0.5 (*) 0.7 - 4.0 (K/uL)   Monocytes Relative 4  3 - 12 (%)   Monocytes Absolute 0.5  0.1 - 1.0 (K/uL)   Eosinophils Relative 2  0 - 5 (%)   Eosinophils Absolute 0.2  0.0 - 0.7 (K/uL)   Basophils Relative 0  0 - 1 (%)   Basophils Absolute 0.1  0.0 - 0.1 (K/uL)  COMPREHENSIVE METABOLIC PANEL     Status: Abnormal   Collection Time   09/06/11 11:45 AM      Component Value Range   Sodium 141  135 - 145 (mEq/L)   Potassium 3.6  3.5 - 5.1 (mEq/L)   Chloride 102  96 - 112 (mEq/L)   CO2 24  19 - 32 (mEq/L)   Glucose, Bld 169 (*) 70 - 99 (mg/dL)   BUN 25 (*) 6 - 23 (mg/dL)   Creatinine, Ser 8.11  0.50 - 1.35 (mg/dL)    Calcium 9.6  8.4 - 10.5 (mg/dL)   Total Protein 7.1  6.0 - 8.3 (g/dL)   Albumin 3.5  3.5 - 5.2 (g/dL)  AST 17  0 - 37 (U/L)   ALT 9  0 - 53 (U/L)   Alkaline Phosphatase 86  39 - 117 (U/L)   Total Bilirubin 0.2 (*) 0.3 - 1.2 (mg/dL)   GFR calc non Af Amer 50 (*) >90 (mL/min)   GFR calc Af Amer 58 (*) >90 (mL/min)  CARDIAC PANEL(CRET KIN+CKTOT+MB+TROPI)     Status: Abnormal   Collection Time   09/06/11 11:45 AM      Component Value Range   Total CK 130  7 - 232 (U/L)   CK, MB 6.1 (*) 0.3 - 4.0 (ng/mL)   Troponin I 1.09 (*) <0.30 (ng/mL)   Relative Index 4.7 (*) 0.0 - 2.5   PRO B NATRIURETIC PEPTIDE     Status: Abnormal   Collection Time   09/06/11 11:45 AM      Component Value Range   BNP, POC 1546.0 (*) 0 - 450 (pg/mL)  PROTIME-INR     Status: Abnormal   Collection Time   09/06/11 11:45 AM      Component Value Range   Prothrombin Time 18.4 (*) 11.6 - 15.2 (seconds)   INR 1.50 (*) 0.00 - 1.49   APTT     Status: Abnormal   Collection Time   09/06/11 11:45 AM      Component Value Range   aPTT 55 (*) 24 - 37 (seconds)  LACTIC ACID, PLASMA     Status: Abnormal   Collection Time   09/06/11 11:45 AM      Component Value Range   Lactic Acid, Venous 3.7 (*) 0.5 - 2.2 (mmol/L)  PROCALCITONIN     Status: Normal   Collection Time   09/06/11 11:45 AM      Component Value Range   Procalcitonin <0.10    MAGNESIUM     Status: Normal   Collection Time   09/06/11 11:45 AM      Component Value Range   Magnesium 2.1  1.5 - 2.5 (mg/dL)  GRAM STAIN     Status: Normal   Collection Time   09/06/11 12:02 PM      Component Value Range   Specimen Description SPUTUM     Special Requests NONE     Gram Stain       Value: FEW WBC PRESENT,BOTH PMN AND MONONUCLEAR     ABUNDANT GRAM POSITIVE COCCI IN PAIRS IN CLUSTERS     ABUNDANT GRAM POSITIVE COCCI IN PAIRS IN CHAINS     RARE GRAM POSITIVE RODS     RARE GRAM NEGATIVE RODS   Report Status 09/06/2011 FINAL    URINALYSIS, ROUTINE W REFLEX  MICROSCOPIC     Status: Abnormal   Collection Time   09/06/11  2:03 PM      Component Value Range   Color, Urine YELLOW  YELLOW    Appearance HAZY (*) CLEAR    Specific Gravity, Urine 1.024  1.005 - 1.030    pH 6.0  5.0 - 8.0    Glucose, UA NEGATIVE  NEGATIVE (mg/dL)   Hgb urine dipstick SMALL (*) NEGATIVE    Bilirubin Urine NEGATIVE  NEGATIVE    Ketones, ur NEGATIVE  NEGATIVE (mg/dL)   Protein, ur 30 (*) NEGATIVE (mg/dL)   Urobilinogen, UA 0.2  0.0 - 1.0 (mg/dL)   Nitrite NEGATIVE  NEGATIVE    Leukocytes, UA NEGATIVE  NEGATIVE   URINE MICROSCOPIC-ADD ON     Status: Abnormal   Collection Time   09/06/11  2:03 PM  Component Value Range   WBC, UA 3-6  <3 (WBC/hpf)   RBC / HPF 7-10  <3 (RBC/hpf)   Bacteria, UA RARE  RARE    Casts HYALINE CASTS (*) NEGATIVE    Urine-Other MUCOUS PRESENT      Radiology/Studies: Dg Chest Portable 1 View  09/06/2011  *RADIOLOGY REPORT*  Clinical Data: Shortness of breath, chest pain, some difficulty breathing  PORTABLE CHEST - 1 VIEW  Comparison: Portable chest x-ray of 06/18/2011  Findings: There is poorly defined airspace disease right greater than left.  Pneumonia cannot be excluded with edema less likely in view of the asymmetry and peripheral distribution.  Cardiomegaly is stable.  No effusion is seen.  IMPRESSION: Patchy airspace disease bilaterally.  Favor multifocal pneumonia. Recommend follow-up two-view chest x-ray  Original Report Authenticated By: Juline Patch, M.D.    EKG:  Unable to locate.  ASSESSMENT AND PLAN:  Principal Problem:  *Pneumonia    Per primary team.  Patient on ABX.  Sepsis  Chest pain:    Mild chest pressure has resolved.  Patient came in hypotensive and hypoxic.  Troponin bump most likely reflects demand ischemia in ths setting   Can follow, cycle enzymes but I would recomm conservative Rx unless marked change.  Treat infection.   Atrial fibrillation    Rates improved.  i would follow now.  Continue antithrombin  agent.   Replete KCL.   Acute respiratory failure with hypoxia  Improving.  Follow now that on Rx.  Check BNP in AM.  Should have good PA and lateral CXR.     Signed, Dietrich Pates 09/06/2011, 6:21 PM  Watch IV fluids.  ONce taking PO well stop and change to heplock.  Reviewed D/C summary from August.  Patient had CVA on Pradaxa.  Also had severe anemia develop.  EGD negative.  Colon with only diverticulosis.  No active bleeding seen.  Per Dr. Pearlean Brownie recomm Xarelto and no ASA.    Discussed with pharmacy  Recomm with renal function to decrease Xarelto to 15 mg.   With bleeding hx I would hold ASA as well.  Pharmacy to follow renal function and dose Xarelto as needed.

## 2011-09-06 NOTE — ED Notes (Signed)
Lab called with critical lab value CKMB 6.1, Troponin 1.09- RN, Dennetta notifed

## 2011-09-07 LAB — CBC
HCT: 27.4 % — ABNORMAL LOW (ref 39.0–52.0)
Hemoglobin: 9.6 g/dL — ABNORMAL LOW (ref 13.0–17.0)
MCH: 26.4 pg (ref 26.0–34.0)
MCHC: 32.5 g/dL (ref 30.0–36.0)
Platelets: 349 10*3/uL (ref 150–400)
Platelets: 298 10*3/uL (ref 150–400)
RBC: 3.63 MIL/uL — ABNORMAL LOW (ref 4.22–5.81)
RDW: 18.5 % — ABNORMAL HIGH (ref 11.5–15.5)
WBC: 13.3 10*3/uL — ABNORMAL HIGH (ref 4.0–10.5)
WBC: 16.2 10*3/uL — ABNORMAL HIGH (ref 4.0–10.5)

## 2011-09-07 LAB — COMPREHENSIVE METABOLIC PANEL
AST: 24 U/L (ref 0–37)
BUN: 32 mg/dL — ABNORMAL HIGH (ref 6–23)
CO2: 20 mEq/L (ref 19–32)
Calcium: 10 mg/dL (ref 8.4–10.5)
Chloride: 101 mEq/L (ref 96–112)
Creatinine, Ser: 1.34 mg/dL (ref 0.50–1.35)
GFR calc Af Amer: 54 mL/min — ABNORMAL LOW (ref >90)
GFR calc non Af Amer: 47 mL/min — ABNORMAL LOW (ref >90)
Total Bilirubin: 0.2 mg/dL — ABNORMAL LOW (ref 0.3–1.2)

## 2011-09-07 LAB — GLUCOSE, CAPILLARY
Glucose-Capillary: 155 mg/dL — ABNORMAL HIGH (ref 70–99)
Glucose-Capillary: 134 mg/dL — ABNORMAL HIGH (ref 70–99)

## 2011-09-07 LAB — URINE CULTURE
Colony Count: NO GROWTH
Culture  Setup Time: 201211052254

## 2011-09-07 LAB — CARDIAC PANEL(CRET KIN+CKTOT+MB+TROPI)
CK, MB: 10.9 ng/mL (ref 0.3–4.0)
Relative Index: 5 — ABNORMAL HIGH (ref 0.0–2.5)
Total CK: 276 U/L — ABNORMAL HIGH (ref 7–232)
Troponin I: 1.16 ng/mL (ref <0.30)
Troponin I: 2.76 ng/mL (ref <0.30)

## 2011-09-07 MED ORDER — INSULIN ASPART 100 UNIT/ML ~~LOC~~ SOLN
0.0000 [IU] | Freq: Every day | SUBCUTANEOUS | Status: DC
  Filled 2011-09-07: qty 3

## 2011-09-07 MED ORDER — INSULIN ASPART 100 UNIT/ML ~~LOC~~ SOLN
0.0000 [IU] | Freq: Three times a day (TID) | SUBCUTANEOUS | Status: DC
  Administered 2011-09-07: 2 [IU] via SUBCUTANEOUS
  Administered 2011-09-08: 1 [IU] via SUBCUTANEOUS
  Administered 2011-09-08: 2 [IU] via SUBCUTANEOUS
  Filled 2011-09-07: qty 3

## 2011-09-07 MED ORDER — SALINE SPRAY 0.65 % NA SOLN
1.0000 | NASAL | Status: DC | PRN
  Administered 2011-09-08: 1 via NASAL
  Filled 2011-09-07: qty 44

## 2011-09-07 MED ORDER — RIVAROXABAN 15 MG PO TABS
15.0000 mg | ORAL_TABLET | Freq: Every day | ORAL | Status: DC
  Administered 2011-09-08: 15 mg via ORAL
  Filled 2011-09-07: qty 2

## 2011-09-07 MED ORDER — ALPRAZOLAM 0.5 MG PO TABS
0.5000 mg | ORAL_TABLET | ORAL | Status: DC | PRN
Start: 2011-09-07 — End: 2011-09-08
  Administered 2011-09-07 – 2011-09-08 (×3): 0.5 mg via ORAL
  Filled 2011-09-07 (×2): qty 1
  Filled 2011-09-07: qty 2
  Filled 2011-09-07: qty 1

## 2011-09-07 MED ORDER — DABIGATRAN ETEXILATE MESYLATE 150 MG PO CAPS: 150.0000 mg | ORAL_CAPSULE | Freq: Two times a day (BID) | ORAL | Status: DC

## 2011-09-07 MED ORDER — VANCOMYCIN HCL IN DEXTROSE 1-5 GM/200ML-% IV SOLN
1000.0000 mg | INTRAVENOUS | Status: DC
  Administered 2011-09-07 – 2011-09-09 (×3): 1000 mg via INTRAVENOUS
  Filled 2011-09-07 (×4): qty 200

## 2011-09-07 NOTE — Progress Notes (Signed)
MEDICATION RELATED CONSULT NOTE - FOLLOW UP   Pharmacy Consult for Medication Reconciliation Indication: Dabigatran or Rivaroxaban  Pt. has a bottle of Dabigatran (Pradaxa) with him that he states is what he has been taking at home.  In a note by Dr. Pearlean Brownie in August, the intent was to switch this patient to Rivaroxaban (Xarelto) due to the occurrence of a stroke while on Dabigatran.  If a prescription was written, it was lost in transition and the patient has continued to take his Dabigatran.   After speaking with Todd [name], we have adjusted his inpatient regimen to reflect Rivaroxaban, however, he will need a prescription and counseling to stop his Dabigatran at discharge.    Todd [name] [place] 09/07/2011,6:46 PM

## 2011-09-07 NOTE — Progress Notes (Signed)
Speech Language/Pathology Clinical/Bedside Swallow Evaluation Patient Details  Name: Todd [name] MRN: [id number] DOB: November 24, 1923 Today's Date: 09/07/2011  Past Medical History:  Past Medical History  Diagnosis Date  . Asthma   . Hypertension   . Diabetes mellitus   . Cancer   . A-fib   . Hyperlipidemia   . Colon cancer   . Shortness of breath    Past Surgical History:  Past Surgical History  Procedure Date  . Colon surgery     Partial hemecolectomy     Assessment/Recommendations/Treatment Plan    SLP Assessment Clinical Impression Statement: No overt s/s aspiration observed or reported.  No history of dysphagia following CVA in August 2012 Risk for Aspiration: Mild Other Related Risk Factors: Previous CVA  Recommendations Solid Consistency: Regular Liquid Consistency: Thin Liquid Administration via: Cup;Straw Medication Administration: Whole meds with liquid Supervision: Patient able to self feed Oral Care Recommendations: Oral care BID;Staff/trained caregiver to provide oral care  Prognosis Prognosis for Safe Diet Advancement: Good  Individuals Consulted Consulted and Agree with Results and Recommendations: Patient;RN  Swallow Study Goals  SLP Swallowing Goals Patient will consume recommended diet without observed clinical signs of aspiration with: Independent assistance Swallow Study Goal #1 - Progress: Progressing toward goal Patient will utilize recommended strategies during swallow to increase swallowing safety with: Independent assistance Swallow Study Goal #2 - Progress: Progressing toward goal  Swallow Study Prior Functional Status     General  Date of Onset: 09/06/11 Other Pertinent Information: Pt reports symptoms of 06/2011 CVA have resolved.  No swallowing difficulties at that time or since. Type of Study: Bedside swallow evaluation Diet Prior to this Study: Regular;Thin liquids Temperature Spikes Noted: No Respiratory Status: Supplemental  O2 delivered via (comment) (3L) History of Intubation: No Behavior/Cognition: Alert;Cooperative;Pleasant mood Oral Cavity - Dentition: Missing dentition Vision: Functional for self-feeding Patient Positioning: Postural control adequate for testing Baseline Vocal Quality: Normal Volitional Cough: Strong Volitional Swallow: Able to elicit Ice chips: Tested (comment)  Oral Motor/Sensory Function  Labial ROM: Within Functional Limits Labial Symmetry: Within Functional Limits Labial Strength: Within Functional Limits Labial Sensation: Within Functional Limits Lingual ROM: Within Functional Limits Lingual Symmetry: Within Functional Limits Lingual Strength: Within Functional Limits Lingual Sensation: Within Functional Limits Facial ROM: Within Functional Limits Facial Symmetry: Within Functional Limits Facial Strength: Within Functional Limits Facial Sensation: Within Functional Limits Velum: Within Functional Limits Mandible: Within Functional Limits  Consistency Results  Ice Chips Ice chips: Within functional limits  Thin Liquid Thin Liquid: Within functional limits Presentation: Cup;Self Fed;Straw  Nectar Thick Liquid Nectar Thick Liquid: Not tested  Honey Thick Liquid Honey Thick Liquid: Not tested  Puree Puree: Not tested  Solid Solid: Within functional limits Presentation: Self Fed;Spoon   Leigh Aurora 09/07/2011,10:11 AM

## 2011-09-07 NOTE — Progress Notes (Signed)
Utilization Review Completed.  Dotti Busey T  09/07/2011 

## 2011-09-07 NOTE — Progress Notes (Signed)
RT in to give Pt scheduled neb treatment.  Pt noted with pox sat @ 96-97% on 02 3L [place]. Respirations 24-31. Pt states  I'm  not in distress at this time. Call out to covering M.D. In regards to increased respirations, no new orders at this time. Will cont. To monitor pt per M.D. Protocol.

## 2011-09-07 NOTE — Progress Notes (Signed)
SUBJECTIVE: Mr. Kahrs   states that he did not sleep well. He has been uncomfortable and short of breath. He denies any chest pain or has not coughed up any blood. He denies fever, chills, diaphoresis. Denies any palpitations.  Filed Vitals:   09/07/11 0300 09/07/11 0400 09/07/11 0500 09/07/11 0600  BP: 116/56 116/67 109/69 111/56  Pulse: 87 92 90 93  Temp:  98 F (36.7 C)    TempSrc:      Resp: 27 31 31 31   Height:      Weight:      SpO2: 96% 96% 95% 95%    Intake/Output Summary (Last 24 hours) at 09/07/11 0740 Last data filed at 09/07/11 0453  Gross per 24 hour  Intake   1025 ml  Output    980 ml  Net     45 ml    LABS: Basic Metabolic Panel:  Basename 09/07/11 0425 09/06/11 1145  NA 139 141  K 4.0 3.6  CL 101 102  CO2 20 24  GLUCOSE 171* 169*  BUN 32* 25*  CREATININE 1.34 1.26  CALCIUM 10.0 9.6  MG -- 2.1  PHOS -- --   Liver Function Tests:  Basename 09/07/11 0425 09/06/11 1145  AST 24 17  ALT 11 9  ALKPHOS 78 86  BILITOT 0.2* 0.2*  PROT 7.0 7.1  ALBUMIN 3.4* 3.5   No results found for this basename: LIPASE:2,AMYLASE:2 in the last 72 hours CBC:  Basename 09/07/11 0425 09/06/11 1145  WBC 13.3* 13.7*  NEUTROABS -- 12.4*  HGB 9.6* 10.3*  HCT 29.3* 31.7*  MCV 80.7 82.3  PLT 349 329   Cardiac Enzymes:  Basename 09/06/11 2345 09/06/11 1754 09/06/11 1145  CKTOTAL 196 144 130  CKMB 10.9* 8.7* 6.1*  CKMBINDEX -- -- --  TROPONINI 1.16* 1.73* 1.09*   BNP:  Basename 09/06/11 2345 09/06/11 1145  POCBNP 5432.0* 1546.0*   D-Dimer: No results found for this basename: DDIMER:2 in the last 72 hours Hemoglobin A1C: No results found for this basename: HGBA1C in the last 72 hours Fasting Lipid Panel: No results found for this basename: CHOL,HDL,LDLCALC,TRIG,CHOLHDL,LDLDIRECT in the last 72 hours Thyroid Function Tests: No results found for this basename: TSH,T4TOTAL,FREET3,T3FREE,THYROIDAB in the last 72 hours Anemia Panel: No results found for this  basename: VITAMINB12,FOLATE,FERRITIN,TIBC,IRON,RETICCTPCT in the last 72 hours  RADIOLOGY: Dg Chest Portable 1 View  09/06/2011  *RADIOLOGY REPORT*  Clinical Data: Shortness of breath, chest pain, some difficulty breathing  PORTABLE CHEST - 1 VIEW  Comparison: Portable chest x-ray of 06/18/2011  Findings: There is poorly defined airspace disease right greater than left.  Pneumonia cannot be excluded with edema less likely in view of the asymmetry and peripheral distribution.  Cardiomegaly is stable.  No effusion is seen.  IMPRESSION: Patchy airspace disease bilaterally.  Favor multifocal pneumonia. Recommend follow-up two-view chest x-ray  Original Report Authenticated By: Juline Patch, M.D.    PHYSICAL EXAM General: elderly male who looks uncomfortable. He is very frail. Head: Eyes PERRLA, No xanthomas.   Normal cephalic and atramatic  Lungs: Decreased breath sounds bilaterally with expiratory rhonchi. He has markedly diminished breath sounds with dullness in the right base. Heart irregular rate and rhythm S1 S2, with soft S4 murmur.  Pulses are 2+ & equal.            No carotid bruit. No JVD.  No abdominal bruits. No femoral bruits. Abdomen: Bowel sounds are positive, abdomen soft and non-tender without masses or  Hernia's noted. Msk:  Back normal, normal gait. Normal strength and tone for age. Extremities: No clubbing, cyanosis or edema.  DP +1 Neuro: Alert and oriented X 3. Psych:  Good affect, responds appropriately  TELEMETRY: Reviewed telemetry pt in : ASSESSMENT AND PLAN:  Mr. Rodriques is an elderly gentleman who is very frail and high-risk poor outcome and for falls. He has had a demand ischemic event secondary to his pneumonia and acute presentation. His A. fib is most likely chronic in the right is well controlled. I have mixed feelings about using anticoagulation saying that he had a stroke once before on this and he is very high risk for fall and bleeding. I would  start discussing end-of-life issues and pallative care if felt appropriate by the primary team. No change in medications and we will follow the distance. Call if questions. Number [phone]  Principal Problem:  *Pneumonia Active Problems:  Sepsis  Chest pain  Atrial fibrillation  Acute respiratory failure with hypoxia    @ME1 @

## 2011-09-07 NOTE — Progress Notes (Signed)
Subjective: Patient seen and examined ,feeling better ,however still c/o SOB .Denies any chest pain.  Objective: Vital signs in last 24 hours: Temp:  [97.3 F (36.3 C)-98 F (36.7 C)] 97.9 F (36.6 C) (11/06 0800) Pulse Rate:  [70-104] 92  (11/06 0912) Resp:  [21-32] 21  (11/06 1003) BP: (89-124)/(47-75) 110/61 mmHg (11/06 1003) SpO2:  [92 %-99 %] 98 % (11/06 1003) Weight:  [73.029 kg (161 lb)-73.3 kg (161 lb 9.6 oz)] 161 lb 9.6 oz (73.3 kg) (11/06 0100) Weight change:  Last BM Date: 09/04/11  Intake/Output from previous day: 11/05 0701 - 11/06 0700 In: 1025 [I.V.:675; IV Piggyback:350] Out: 980 [Urine:980]     Physical Exam: General: Alert, awake in mild acute distress. Heart: irregular irregular rate and rhythm, without murmurs, rubs, gallops. Lungs: bibasilar rales. Abdomen: Soft, nontender, nondistended, positive bowel sounds. Extremities: No clubbing cyanosis or edema with positive pedal pulses.     Lab Results: Results for orders placed during the hospital encounter of 09/06/11 (from the past 24 hour(s))  GLUCOSE, CAPILLARY     Status: Abnormal   Collection Time   09/06/11 11:22 AM      Component Value Range   Glucose-Capillary 182 (*) 70 - 99 (mg/dL)  CULTURE, BLOOD (ROUTINE X 2)     Status: Normal (Preliminary result)   Collection Time   09/06/11 11:31 AM      Component Value Range   Specimen Description BLOOD LEFT ARM     Special Requests BOTTLES DRAWN AEROBIC ONLY 10CC     Setup Time [id number]     Culture       Value:        BLOOD CULTURE RECEIVED NO GROWTH TO DATE CULTURE WILL BE HELD FOR 5 DAYS BEFORE ISSUING A FINAL NEGATIVE REPORT   Report Status PENDING    CBC     Status: Abnormal   Collection Time   09/06/11 11:45 AM      Component Value Range   WBC 13.7 (*) 4.0 - 10.5 (K/uL)   RBC 3.85 (*) 4.22 - 5.81 (MIL/uL)   Hemoglobin 10.3 (*) 13.0 - 17.0 (g/dL)   HCT 74.2 (*) 59.5 - 52.0 (%)   MCV 82.3  78.0 - 100.0 (fL)   MCH 26.8  26.0 - 34.0 (pg)     MCHC 32.5  30.0 - 36.0 (g/dL)   RDW 63.8 (*) 75.6 - 15.5 (%)   Platelets 329  150 - 400 (K/uL)  DIFFERENTIAL     Status: Abnormal   Collection Time   09/06/11 11:45 AM      Component Value Range   Neutrophils Relative 91 (*) 43 - 77 (%)   Neutro Abs 12.4 (*) 1.7 - 7.7 (K/uL)   Lymphocytes Relative 4 (*) 12 - 46 (%)   Lymphs Abs 0.5 (*) 0.7 - 4.0 (K/uL)   Monocytes Relative 4  3 - 12 (%)   Monocytes Absolute 0.5  0.1 - 1.0 (K/uL)   Eosinophils Relative 2  0 - 5 (%)   Eosinophils Absolute 0.2  0.0 - 0.7 (K/uL)   Basophils Relative 0  0 - 1 (%)   Basophils Absolute 0.1  0.0 - 0.1 (K/uL)  COMPREHENSIVE METABOLIC PANEL     Status: Abnormal   Collection Time   09/06/11 11:45 AM      Component Value Range   Sodium 141  135 - 145 (mEq/L)   Potassium 3.6  3.5 - 5.1 (mEq/L)   Chloride 102  96 - 112 (mEq/L)  CO2 24  19 - 32 (mEq/L)   Glucose, Bld 169 (*) 70 - 99 (mg/dL)   BUN 25 (*) 6 - 23 (mg/dL)   Creatinine, Ser 1.61  0.50 - 1.35 (mg/dL)   Calcium 9.6  8.4 - 09.6 (mg/dL)   Total Protein 7.1  6.0 - 8.3 (g/dL)   Albumin 3.5  3.5 - 5.2 (g/dL)   AST 17  0 - 37 (U/L)   ALT 9  0 - 53 (U/L)   Alkaline Phosphatase 86  39 - 117 (U/L)   Total Bilirubin 0.2 (*) 0.3 - 1.2 (mg/dL)   GFR calc non Af Amer 50 (*) >90 (mL/min)   GFR calc Af Amer 58 (*) >90 (mL/min)  CARDIAC PANEL(CRET KIN+CKTOT+MB+TROPI)     Status: Abnormal   Collection Time   09/06/11 11:45 AM      Component Value Range   Total CK 130  7 - 232 (U/L)   CK, MB 6.1 (*) 0.3 - 4.0 (ng/mL)   Troponin I 1.09 (*) <0.30 (ng/mL)   Relative Index 4.7 (*) 0.0 - 2.5   PRO B NATRIURETIC PEPTIDE     Status: Abnormal   Collection Time   09/06/11 11:45 AM      Component Value Range   BNP, POC 1546.0 (*) 0 - 450 (pg/mL)  PROTIME-INR     Status: Abnormal   Collection Time   09/06/11 11:45 AM      Component Value Range   Prothrombin Time 18.4 (*) 11.6 - 15.2 (seconds)   INR 1.50 (*) 0.00 - 1.49   APTT     Status: Abnormal   Collection  Time   09/06/11 11:45 AM      Component Value Range   aPTT 55 (*) 24 - 37 (seconds)  LACTIC ACID, PLASMA     Status: Abnormal   Collection Time   09/06/11 11:45 AM      Component Value Range   Lactic Acid, Venous 3.7 (*) 0.5 - 2.2 (mmol/L)  PROCALCITONIN     Status: Normal   Collection Time   09/06/11 11:45 AM      Component Value Range   Procalcitonin <0.10    MAGNESIUM     Status: Normal   Collection Time   09/06/11 11:45 AM      Component Value Range   Magnesium 2.1  1.5 - 2.5 (mg/dL)  CULTURE, BLOOD (ROUTINE X 2)     Status: Normal (Preliminary result)   Collection Time   09/06/11 11:50 AM      Component Value Range   Specimen Description BLOOD LEFT WRIST     Special Requests BOTTLES DRAWN AEROBIC ONLY 10CC     Setup Time [id number]     Culture       Value:        BLOOD CULTURE RECEIVED NO GROWTH TO DATE CULTURE WILL BE HELD FOR 5 DAYS BEFORE ISSUING A FINAL NEGATIVE REPORT   Report Status PENDING    GRAM STAIN     Status: Normal   Collection Time   09/06/11 12:02 PM      Component Value Range   Specimen Description SPUTUM     Special Requests NONE     Gram Stain       Value: FEW WBC PRESENT,BOTH PMN AND MONONUCLEAR     ABUNDANT GRAM POSITIVE COCCI IN PAIRS IN CLUSTERS     ABUNDANT GRAM POSITIVE COCCI IN PAIRS IN CHAINS     RARE GRAM POSITIVE RODS  RARE GRAM NEGATIVE RODS   Report Status 09/06/2011 FINAL    CULTURE, RESPIRATORY     Status: Normal (Preliminary result)   Collection Time   09/06/11 12:02 PM      Component Value Range   Specimen Description SPUTUM     Special Requests NONE     Gram Stain       Value: FEW WBC PRESENT,BOTH PMN AND MONONUCLEAR     ABUNDANT SQUAMOUS EPITHELIAL CELLS PRESENT     ABUNDANT GRAM POSITIVE COCCI IN PAIRS AND CHAINS     IN CLUSTERS RARE GRAM POSITIVE RODS     RARE GRAM NEGATIVE RODS   Culture Culture reincubated for better growth     Report Status PENDING    URINALYSIS, ROUTINE W REFLEX MICROSCOPIC     Status: Abnormal    Collection Time   09/06/11  2:03 PM      Component Value Range   Color, Urine YELLOW  YELLOW    Appearance HAZY (*) CLEAR    Specific Gravity, Urine 1.024  1.005 - 1.030    pH 6.0  5.0 - 8.0    Glucose, UA NEGATIVE  NEGATIVE (mg/dL)   Hgb urine dipstick SMALL (*) NEGATIVE    Bilirubin Urine NEGATIVE  NEGATIVE    Ketones, ur NEGATIVE  NEGATIVE (mg/dL)   Protein, ur 30 (*) NEGATIVE (mg/dL)   Urobilinogen, UA 0.2  0.0 - 1.0 (mg/dL)   Nitrite NEGATIVE  NEGATIVE    Leukocytes, UA NEGATIVE  NEGATIVE   URINE MICROSCOPIC-ADD ON     Status: Abnormal   Collection Time   09/06/11  2:03 PM      Component Value Range   WBC, UA 3-6  <3 (WBC/hpf)   RBC / HPF 7-10  <3 (RBC/hpf)   Bacteria, UA RARE  RARE    Casts HYALINE CASTS (*) NEGATIVE    Urine-Other MUCOUS PRESENT    CULTURE, BLOOD (ROUTINE X 2)     Status: Normal (Preliminary result)   Collection Time   09/06/11  5:10 PM      Component Value Range   Specimen Description BLOOD RIGHT ARM     Special Requests BOTTLES DRAWN AEROBIC AND ANAEROBIC 10CC EACH     Setup Time [id number]     Culture       Value:        BLOOD CULTURE RECEIVED NO GROWTH TO DATE CULTURE WILL BE HELD FOR 5 DAYS BEFORE ISSUING A FINAL NEGATIVE REPORT   Report Status PENDING    CULTURE, BLOOD (ROUTINE X 2)     Status: Normal (Preliminary result)   Collection Time   09/06/11  5:19 PM      Component Value Range   Specimen Description BLOOD LEFT ARM     Special Requests BOTTLES DRAWN AEROBIC AND ANAEROBIC 10CC EACH     Setup Time [id number]     Culture       Value:        BLOOD CULTURE RECEIVED NO GROWTH TO DATE CULTURE WILL BE HELD FOR 5 DAYS BEFORE ISSUING A FINAL NEGATIVE REPORT   Report Status PENDING    MRSA PCR SCREENING     Status: Normal   Collection Time   09/06/11  5:33 PM      Component Value Range   MRSA by PCR NEGATIVE  NEGATIVE   CARDIAC PANEL(CRET KIN+CKTOT+MB+TROPI)     Status: Abnormal   Collection Time   09/06/11  5:54 PM      Component  Value  Range   Total CK 144  7 - 232 (U/L)   CK, MB 8.7 (*) 0.3 - 4.0 (ng/mL)   Troponin I 1.73 (*) <0.30 (ng/mL)   Relative Index 6.0 (*) 0.0 - 2.5   CARDIAC PANEL(CRET KIN+CKTOT+MB+TROPI)     Status: Abnormal   Collection Time   09/06/11 11:45 PM      Component Value Range   Total CK 196  7 - 232 (U/L)   CK, MB 10.9 (*) 0.3 - 4.0 (ng/mL)   Troponin I 1.16 (*) <0.30 (ng/mL)   Relative Index 5.6 (*) 0.0 - 2.5   PRO B NATRIURETIC PEPTIDE     Status: Abnormal   Collection Time   09/06/11 11:45 PM      Component Value Range   BNP, POC 5432.0 (*) 0 - 450 (pg/mL)  COMPREHENSIVE METABOLIC PANEL     Status: Abnormal   Collection Time   09/07/11  4:25 AM      Component Value Range   Sodium 139  135 - 145 (mEq/L)   Potassium 4.0  3.5 - 5.1 (mEq/L)   Chloride 101  96 - 112 (mEq/L)   CO2 20  19 - 32 (mEq/L)   Glucose, Bld 171 (*) 70 - 99 (mg/dL)   BUN 32 (*) 6 - 23 (mg/dL)   Creatinine, Ser 0.45  0.50 - 1.35 (mg/dL)   Calcium 40.9  8.4 - 10.5 (mg/dL)   Total Protein 7.0  6.0 - 8.3 (g/dL)   Albumin 3.4 (*) 3.5 - 5.2 (g/dL)   AST 24  0 - 37 (U/L)   ALT 11  0 - 53 (U/L)   Alkaline Phosphatase 78  39 - 117 (U/L)   Total Bilirubin 0.2 (*) 0.3 - 1.2 (mg/dL)   GFR calc non Af Amer 47 (*) >90 (mL/min)   GFR calc Af Amer 54 (*) >90 (mL/min)  CBC     Status: Abnormal   Collection Time   09/07/11  4:25 AM      Component Value Range   WBC 13.3 (*) 4.0 - 10.5 (K/uL)   RBC 3.63 (*) 4.22 - 5.81 (MIL/uL)   Hemoglobin 9.6 (*) 13.0 - 17.0 (g/dL)   HCT 81.1 (*) 91.4 - 52.0 (%)   MCV 80.7  78.0 - 100.0 (fL)   MCH 26.4  26.0 - 34.0 (pg)   MCHC 32.8  30.0 - 36.0 (g/dL)   RDW 78.2 (*) 95.6 - 15.5 (%)   Platelets 349  150 - 400 (K/uL)  CARDIAC PANEL(CRET KIN+CKTOT+MB+TROPI)     Status: Abnormal   Collection Time   09/07/11  8:03 AM      Component Value Range   Total CK 276 (*) 7 - 232 (U/L)   CK, MB 13.8 (*) 0.3 - 4.0 (ng/mL)   Troponin I 2.76 (*) <0.30 (ng/mL)   Relative Index 5.0 (*) 0.0 - 2.5      Recent Results (from the past 240 hour(s))  CULTURE, BLOOD (ROUTINE X 2)     Status: Normal (Preliminary result)   Collection Time   09/06/11 11:31 AM      Component Value Range Status Comment   Specimen Description BLOOD LEFT ARM   Final    Special Requests BOTTLES DRAWN AEROBIC ONLY 10CC   Final    Setup Time [id number]   Final    Culture     Final    Value:        BLOOD CULTURE RECEIVED NO GROWTH TO DATE  CULTURE WILL BE HELD FOR 5 DAYS BEFORE ISSUING A FINAL NEGATIVE REPORT   Report Status PENDING   Incomplete   CULTURE, BLOOD (ROUTINE X 2)     Status: Normal (Preliminary result)   Collection Time   09/06/11 11:50 AM      Component Value Range Status Comment   Specimen Description BLOOD LEFT WRIST   Final    Special Requests BOTTLES DRAWN AEROBIC ONLY 10CC   Final    Setup Time [id number]   Final    Culture     Final    Value:        BLOOD CULTURE RECEIVED NO GROWTH TO DATE CULTURE WILL BE HELD FOR 5 DAYS BEFORE ISSUING A FINAL NEGATIVE REPORT   Report Status PENDING   Incomplete   GRAM STAIN     Status: Normal   Collection Time   09/06/11 12:02 PM      Component Value Range Status Comment   Specimen Description SPUTUM   Final    Special Requests NONE   Final    Gram Stain     Final    Value: FEW WBC PRESENT,BOTH PMN AND MONONUCLEAR     ABUNDANT GRAM POSITIVE COCCI IN PAIRS IN CLUSTERS     ABUNDANT GRAM POSITIVE COCCI IN PAIRS IN CHAINS     RARE GRAM POSITIVE RODS     RARE GRAM NEGATIVE RODS   Report Status 09/06/2011 FINAL   Final   CULTURE, RESPIRATORY     Status: Normal (Preliminary result)   Collection Time   09/06/11 12:02 PM      Component Value Range Status Comment   Specimen Description SPUTUM   Final    Special Requests NONE   Final    Gram Stain     Final    Value: FEW WBC PRESENT,BOTH PMN AND MONONUCLEAR     ABUNDANT SQUAMOUS EPITHELIAL CELLS PRESENT     ABUNDANT GRAM POSITIVE COCCI IN PAIRS AND CHAINS     IN CLUSTERS RARE GRAM POSITIVE RODS     RARE  GRAM NEGATIVE RODS   Culture Culture reincubated for better growth   Final    Report Status PENDING   Incomplete   CULTURE, BLOOD (ROUTINE X 2)     Status: Normal (Preliminary result)   Collection Time   09/06/11  5:10 PM      Component Value Range Status Comment   Specimen Description BLOOD RIGHT ARM   Final    Special Requests BOTTLES DRAWN AEROBIC AND ANAEROBIC 10CC EACH   Final    Setup Time [id number]   Final    Culture     Final    Value:        BLOOD CULTURE RECEIVED NO GROWTH TO DATE CULTURE WILL BE HELD FOR 5 DAYS BEFORE ISSUING A FINAL NEGATIVE REPORT   Report Status PENDING   Incomplete   CULTURE, BLOOD (ROUTINE X 2)     Status: Normal (Preliminary result)   Collection Time   09/06/11  5:19 PM      Component Value Range Status Comment   Specimen Description BLOOD LEFT ARM   Final    Special Requests BOTTLES DRAWN AEROBIC AND ANAEROBIC 10CC EACH   Final    Setup Time [id number]   Final    Culture     Final    Value:        BLOOD CULTURE RECEIVED NO GROWTH TO DATE CULTURE WILL BE HELD FOR 5 DAYS BEFORE ISSUING  A FINAL NEGATIVE REPORT   Report Status PENDING   Incomplete   MRSA PCR SCREENING     Status: Normal   Collection Time   09/06/11  5:33 PM      Component Value Range Status Comment   MRSA by PCR NEGATIVE  NEGATIVE  Final     Studies/Results: Dg Chest Portable 1 View  09/06/2011  *RADIOLOGY REPORT*  Clinical Data: Shortness of breath, chest pain, some difficulty breathing  PORTABLE CHEST - 1 VIEW  Comparison: Portable chest x-ray of 06/18/2011  Findings: There is poorly defined airspace disease right greater than left.  Pneumonia cannot be excluded with edema less likely in view of the asymmetry and peripheral distribution.  Cardiomegaly is stable.  No effusion is seen.  IMPRESSION: Patchy airspace disease bilaterally.  Favor multifocal pneumonia. Recommend follow-up two-view chest x-ray  Original Report Authenticated By: Juline Patch, M.D.    Medications:    .  acetaminophen  650 mg Oral Once  . albuterol  5 mg Nebulization Q4H  . albuterol  5 mg Nebulization Once  . albuterol  5 mg Nebulization Q4H  . albuterol  5 mg Nebulization Once  . aspirin      . aspirin  324 mg Oral Once  . calcium-vitamin D  1 tablet Oral Daily  . fluticasone  1 spray Each Nare Daily  . Fluticasone-Salmeterol  1 puff Inhalation Q12H  . ipratropium  0.5 mg Nebulization Once  . ipratropium  0.5 mg Nebulization Once  . ipratropium  0.5 mg Nebulization Once  . ipratropium  0.5 mg Nebulization Q6H  . levofloxacin (LEVAQUIN) IV  750 mg Intravenous Q48H  . methylPREDNISolone sodium succinate      . multivitamins ther. w/minerals  1 tablet Oral Daily  . pantoprazole  40 mg Oral BID  . piperacillin-tazobactam (ZOSYN)  IV  3.375 g Intravenous Once  . piperacillin-tazobactam (ZOSYN)  IV  3.375 g Intravenous Q8H  . rivaroxaban  15 mg Oral Daily  . simvastatin  10 mg Oral q1800  . sodium chloride  250 mL Intravenous Once  . vancomycin  1,000 mg Intravenous Once  . vancomycin  1,000 mg Intravenous Q24H  . DISCONTD: amiodarone  150 mg Intravenous Once  . DISCONTD: aspirin  325 mg Oral Daily  . DISCONTD: fluticasone  2 spray Each Nare Daily  . DISCONTD: rivaroxaban  20 mg Oral Daily  . DISCONTD: simvastatin  5 mg Oral q1800  . DISCONTD: vancomycin  750 mg Intravenous Q12H    acetaminophen, acetaminophen, albuterol, ALPRAZolam, ondansetron (ZOFRAN) IV, ondansetron, senna     . sodium chloride 75 mL/hr at 09/06/11 2345    Assessment/Plan:  Principal Problem:  *Health care associated Pneumonia slowly improving  Continue vancomycin,zosyn and levaquin as per pharmacy  Continue nebs. Active Problems:  Sepsis Blood pressure improved ,blood cultures negative so far .  Chest pain/elevated cardiac markers: Secondary to demend ischemia as per cardiology ,now chest pain free.  Atrial fibrillation,chronic: Rate in the 90s ,not in rate controlling agent. On xarelto  .Dr  Daleen Squibb suggested palliative care consult to address anticoagulation and goals of care which is very reasonable ,I spoke to his family who are agreeable,will consult PMT.  Acute respiratory failure with hypoxia Continue o2 supplementation,nebs and treatment of PNA. DMII: Start sensitive insulin scale.monitor CBG. DIET: As per speech recommendations. Change IVF to [facility]. History of recent stroke: On Xarelto Continue to monitor in SDU   LOS: 1 day   Pallas Wahlert 09/07/2011, 10:52 AM

## 2011-09-07 NOTE — Progress Notes (Signed)
Patient: Todd [name]  Palliative Medicine Team consult for goals of care, anticoagulation discussion requested by Dr Cleotis Lema. Spoke with patient at bedside, contacted wife Pattricia Boss who requests meeting time be arranged with daughter Colin Rhein; contacted Stanton Kidney [phone]; h: [phone]); meting scheduled for today Tuesday, 09/07/11 @ 4:30 pm  Valente David, RN  09/07/2011 12:57 PM Palliative Medicine Team

## 2011-09-07 NOTE — Progress Notes (Signed)
ANTIBIOTIC CONSULT NOTE - FOLLOW UP  Pharmacy Consult for  Indication: rule out pneumonia  No Known Allergies  Patient Measurements: Height: 6' (182.9 cm) Weight: 161 lb 9.6 oz (73.3 kg) IBW/kg (Calculated) : 77.6  Adjusted Body Weight:   Vital Signs: Temp: 97.9 F (36.6 C) (11/06 0800) BP: 110/61 mmHg (11/06 1003) Pulse Rate: 92  (11/06 0912) Intake/Output from previous day: 11/05 0701 - 11/06 0700 In: 1025 [I.V.:675; IV Piggyback:350] Out: 980 [Urine:980] Intake/Output from this shift:    Labs:  Basename 09/07/11 0425 09/06/11 1145  WBC 13.3* 13.7*  HGB 9.6* 10.3*  PLT 349 329  LABCREA -- --  CREATININE 1.34 1.26   Estimated Creatinine Clearance: 41.8 ml/min (by C-G formula based on Cr of 1.34).   Microbiology: Recent Results (from the past 720 hour(s))  CULTURE, BLOOD (ROUTINE X 2)     Status: Normal (Preliminary result)   Collection Time   09/06/11 11:31 AM      Component Value Range Status Comment   Specimen Description BLOOD LEFT ARM   Final    Special Requests BOTTLES DRAWN AEROBIC ONLY 10CC   Final    Setup Time [id number]   Final    Culture     Final    Value:        BLOOD CULTURE RECEIVED NO GROWTH TO DATE CULTURE WILL BE HELD FOR 5 DAYS BEFORE ISSUING A FINAL NEGATIVE REPORT   Report Status PENDING   Incomplete   CULTURE, BLOOD (ROUTINE X 2)     Status: Normal (Preliminary result)   Collection Time   09/06/11 11:50 AM      Component Value Range Status Comment   Specimen Description BLOOD LEFT WRIST   Final    Special Requests BOTTLES DRAWN AEROBIC ONLY 10CC   Final    Setup Time [id number]   Final    Culture     Final    Value:        BLOOD CULTURE RECEIVED NO GROWTH TO DATE CULTURE WILL BE HELD FOR 5 DAYS BEFORE ISSUING A FINAL NEGATIVE REPORT   Report Status PENDING   Incomplete   GRAM STAIN     Status: Normal   Collection Time   09/06/11 12:02 PM      Component Value Range Status Comment   Specimen Description SPUTUM   Final    Special  Requests NONE   Final    Gram Stain     Final    Value: FEW WBC PRESENT,BOTH PMN AND MONONUCLEAR     ABUNDANT GRAM POSITIVE COCCI IN PAIRS IN CLUSTERS     ABUNDANT GRAM POSITIVE COCCI IN PAIRS IN CHAINS     RARE GRAM POSITIVE RODS     RARE GRAM NEGATIVE RODS   Report Status 09/06/2011 FINAL   Final   CULTURE, RESPIRATORY     Status: Normal (Preliminary result)   Collection Time   09/06/11 12:02 PM      Component Value Range Status Comment   Specimen Description SPUTUM   Final    Special Requests NONE   Final    Gram Stain     Final    Value: FEW WBC PRESENT,BOTH PMN AND MONONUCLEAR     ABUNDANT SQUAMOUS EPITHELIAL CELLS PRESENT     ABUNDANT GRAM POSITIVE COCCI IN PAIRS AND CHAINS     IN CLUSTERS RARE GRAM POSITIVE RODS     RARE GRAM NEGATIVE RODS   Culture Culture reincubated for better growth   Final  Report Status PENDING   Incomplete   CULTURE, BLOOD (ROUTINE X 2)     Status: Normal (Preliminary result)   Collection Time   09/06/11  5:10 PM      Component Value Range Status Comment   Specimen Description BLOOD RIGHT ARM   Final    Special Requests BOTTLES DRAWN AEROBIC AND ANAEROBIC 10CC EACH   Final    Setup Time [id number]   Final    Culture     Final    Value:        BLOOD CULTURE RECEIVED NO GROWTH TO DATE CULTURE WILL BE HELD FOR 5 DAYS BEFORE ISSUING A FINAL NEGATIVE REPORT   Report Status PENDING   Incomplete   CULTURE, BLOOD (ROUTINE X 2)     Status: Normal (Preliminary result)   Collection Time   09/06/11  5:19 PM      Component Value Range Status Comment   Specimen Description BLOOD LEFT ARM   Final    Special Requests BOTTLES DRAWN AEROBIC AND ANAEROBIC 10CC EACH   Final    Setup Time [id number]   Final    Culture     Final    Value:        BLOOD CULTURE RECEIVED NO GROWTH TO DATE CULTURE WILL BE HELD FOR 5 DAYS BEFORE ISSUING A FINAL NEGATIVE REPORT   Report Status PENDING   Incomplete   MRSA PCR SCREENING     Status: Normal   Collection Time   09/06/11   5:33 PM      Component Value Range Status Comment   MRSA by PCR NEGATIVE  NEGATIVE  Final     Anti-infectives     Start     Dose/Rate Route Frequency Ordered Stop   09/07/11 0000   vancomycin (VANCOCIN) 750 mg in sodium chloride 0.9 % 150 mL IVPB  Status:  Discontinued        750 mg 150 mL/hr over 60 Minutes Intravenous Every 12 hours 09/06/11 1737 09/07/11 1032   09/06/11 1100  piperacillin-tazobactam (ZOSYN) IVPB 3.375 g       3.375 g 12.5 mL/hr over 240 Minutes Intravenous  Once 09/06/11 1056 09/06/11 1629   09/06/11 1100   vancomycin (VANCOCIN) IVPB 1000 mg/200 mL premix        1,000 mg 200 mL/hr over 60 Minutes Intravenous  Once 09/06/11 1056 09/06/11 1200          Assessment: Creatinine up slightly.  Creatinine clearance 42 mL/min  Goal of Therapy:  Vancomycin trough level 15-20 mcg/ml  Plan:  Measure antibiotic drug levels at steady state.  Follow up culture results.  Change Vancomycin dose to 1g IV daily.  Continue Levaquin and Zosyn at current doses.  Mickeal Skinner 09/07/2011,10:32 AM

## 2011-09-08 ENCOUNTER — Inpatient Hospital Stay (HOSPITAL_COMMUNITY): Payer: Medicare Other

## 2011-09-08 ENCOUNTER — Encounter (HOSPITAL_COMMUNITY): Payer: Self-pay | Admitting: Pulmonary Disease

## 2011-09-08 DIAGNOSIS — J690 Pneumonitis due to inhalation of food and vomit: Secondary | ICD-10-CM

## 2011-09-08 DIAGNOSIS — I517 Cardiomegaly: Secondary | ICD-10-CM

## 2011-09-08 DIAGNOSIS — R0602 Shortness of breath: Secondary | ICD-10-CM

## 2011-09-08 DIAGNOSIS — I4891 Unspecified atrial fibrillation: Secondary | ICD-10-CM

## 2011-09-08 DIAGNOSIS — A419 Sepsis, unspecified organism: Secondary | ICD-10-CM

## 2011-09-08 DIAGNOSIS — J9600 Acute respiratory failure, unspecified whether with hypoxia or hypercapnia: Secondary | ICD-10-CM

## 2011-09-08 HISTORY — PX: CENTRAL LINE INSERTION: NUR33

## 2011-09-08 LAB — BASIC METABOLIC PANEL
Chloride: 102 mEq/L (ref 96–112)
GFR calc Af Amer: 58 mL/min — ABNORMAL LOW (ref >90)
GFR calc non Af Amer: 50 mL/min — ABNORMAL LOW (ref >90)
Potassium: 3.8 mEq/L (ref 3.5–5.1)
Sodium: 138 mEq/L (ref 135–145)

## 2011-09-08 LAB — CBC
Platelets: 307 10*3/uL (ref 150–400)
RBC: 3.55 MIL/uL — ABNORMAL LOW (ref 4.22–5.81)
RDW: 19 % — ABNORMAL HIGH (ref 11.5–15.5)
WBC: 16.4 10*3/uL — ABNORMAL HIGH (ref 4.0–10.5)

## 2011-09-08 LAB — POCT I-STAT 3, ART BLOOD GAS (G3+)
Acid-Base Excess: 1 mmol/L (ref 0.0–2.0)
Acid-base deficit: 1 mmol/L (ref 0.0–2.0)
Bicarbonate: 23.1 mEq/L (ref 20.0–24.0)
Bicarbonate: 25.9 mEq/L — ABNORMAL HIGH (ref 20.0–24.0)
O2 Saturation: 100 %
Patient temperature: 97
Patient temperature: 37
TCO2: 24 mmol/L (ref 0–100)
pH, Arterial: 7.435 (ref 7.350–7.450)
pO2, Arterial: 260 mmHg — ABNORMAL HIGH (ref 80.0–100.0)

## 2011-09-08 LAB — CULTURE, RESPIRATORY W GRAM STAIN

## 2011-09-08 LAB — GLUCOSE, CAPILLARY
Glucose-Capillary: 148 mg/dL — ABNORMAL HIGH (ref 70–99)
Glucose-Capillary: 178 mg/dL — ABNORMAL HIGH (ref 70–99)

## 2011-09-08 LAB — LACTIC ACID, PLASMA: Lactic Acid, Venous: 1.5 mmol/L (ref 0.5–2.2)

## 2011-09-08 MED ORDER — PANTOPRAZOLE SODIUM 40 MG IV SOLR
40.0000 mg | INTRAVENOUS | Status: DC
  Administered 2011-09-08 – 2011-09-11 (×4): 40 mg via INTRAVENOUS
  Filled 2011-09-08 (×4): qty 40

## 2011-09-08 MED ORDER — SODIUM CHLORIDE 0.9 % IV SOLN
INTRAVENOUS | Status: DC
  Administered 2011-09-08 – 2011-09-09 (×2): via INTRAVENOUS

## 2011-09-08 MED ORDER — CHLORHEXIDINE GLUCONATE 0.12 % MT SOLN
15.0000 mL | Freq: Two times a day (BID) | OROMUCOSAL | Status: DC
  Administered 2011-09-08 – 2011-09-10 (×4): 15 mL via OROMUCOSAL
  Filled 2011-09-08 (×7): qty 15

## 2011-09-08 MED ORDER — FENTANYL CITRATE 0.05 MG/ML IJ SOLN
25.0000 ug | INTRAMUSCULAR | Status: DC | PRN
  Administered 2011-09-08: 25 ug via INTRAVENOUS
  Filled 2011-09-08 (×2): qty 2

## 2011-09-08 MED ORDER — INSULIN ASPART 100 UNIT/ML ~~LOC~~ SOLN
0.0000 [IU] | SUBCUTANEOUS | Status: DC
  Administered 2011-09-08 – 2011-09-09 (×3): 1 [IU] via SUBCUTANEOUS
  Administered 2011-09-09: 0 [IU] via SUBCUTANEOUS
  Administered 2011-09-09: 1 [IU] via SUBCUTANEOUS
  Administered 2011-09-09: 0 [IU] via SUBCUTANEOUS
  Administered 2011-09-10: 2 [IU] via SUBCUTANEOUS
  Administered 2011-09-10 – 2011-09-11 (×5): 1 [IU] via SUBCUTANEOUS
  Administered 2011-09-11: 2 [IU] via SUBCUTANEOUS
  Administered 2011-09-11: 1 [IU] via SUBCUTANEOUS
  Administered 2011-09-11 – 2011-09-12 (×2): 2 [IU] via SUBCUTANEOUS
  Administered 2011-09-12: 3 [IU] via SUBCUTANEOUS
  Administered 2011-09-12 (×2): 1 [IU] via SUBCUTANEOUS
  Administered 2011-09-12: 3 [IU] via SUBCUTANEOUS
  Administered 2011-09-12: 2 [IU] via SUBCUTANEOUS
  Administered 2011-09-13 (×2): 1 [IU] via SUBCUTANEOUS
  Filled 2011-09-08: qty 3

## 2011-09-08 MED ORDER — ALBUTEROL SULFATE (5 MG/ML) 0.5% IN NEBU
2.5000 mg | INHALATION_SOLUTION | Freq: Four times a day (QID) | RESPIRATORY_TRACT | Status: DC
  Administered 2011-09-08 – 2011-09-13 (×20): 2.5 mg via RESPIRATORY_TRACT
  Filled 2011-09-08 (×41): qty 0.5

## 2011-09-08 MED ORDER — SODIUM CHLORIDE 0.9 % IJ SOLN
3.0000 mL | Freq: Two times a day (BID) | INTRAMUSCULAR | Status: DC
  Administered 2011-09-08 – 2011-09-09 (×3): 3 mL via INTRAVENOUS

## 2011-09-08 MED ORDER — MORPHINE SULFATE 2 MG/ML IJ SOLN
0.5000 mg | INTRAMUSCULAR | Status: DC | PRN
  Administered 2011-09-15: 0.5 mg via INTRAVENOUS
  Filled 2011-09-08 (×2): qty 1

## 2011-09-08 MED ORDER — DILTIAZEM HCL 30 MG PO TABS
30.0000 mg | ORAL_TABLET | Freq: Four times a day (QID) | ORAL | Status: DC
  Administered 2011-09-08 – 2011-09-09 (×4): 30 mg via ORAL
  Filled 2011-09-08 (×9): qty 1

## 2011-09-08 MED ORDER — BIOTENE DRY MOUTH MT LIQD
15.0000 mL | Freq: Four times a day (QID) | OROMUCOSAL | Status: DC
  Administered 2011-09-09 – 2011-09-10 (×8): 15 mL via OROMUCOSAL

## 2011-09-08 MED ORDER — SODIUM CHLORIDE 0.9 % IJ SOLN
10.0000 mL | Freq: Two times a day (BID) | INTRAMUSCULAR | Status: DC
  Administered 2011-09-08 – 2011-09-19 (×19): 10 mL via INTRAVENOUS
  Administered 2011-09-20: 3 mL via INTRAVENOUS

## 2011-09-08 MED ORDER — TRAZODONE HCL 50 MG PO TABS
50.0000 mg | ORAL_TABLET | Freq: Every evening | ORAL | Status: DC | PRN
  Filled 2011-09-08: qty 1

## 2011-09-08 MED ORDER — ASPIRIN EC 81 MG PO TBEC
81.0000 mg | DELAYED_RELEASE_TABLET | Freq: Every day | ORAL | Status: DC
  Administered 2011-09-08: 81 mg via ORAL
  Filled 2011-09-08 (×2): qty 1

## 2011-09-08 MED ORDER — MIDAZOLAM HCL 2 MG/2ML IJ SOLN
1.0000 mg | INTRAMUSCULAR | Status: DC | PRN
  Administered 2011-09-08 – 2011-09-09 (×3): 2 mg via INTRAVENOUS
  Administered 2011-09-10: 1 mg via INTRAVENOUS
  Filled 2011-09-08 (×6): qty 2

## 2011-09-08 NOTE — Plan of Care (Signed)
Problem: ICU Phase Progression Outcomes Goal: O2 sats trending toward baseline Outcome: Not Progressing Worsening PNA. ? CHF. Goal: Dyspnea controlled at rest Outcome: Not Progressing Tachypnea @ rest-rate 40s. Very shallow breaths.  Comments:  On [facility] Team with [facility] as consults.

## 2011-09-08 NOTE — Progress Notes (Signed)
  Echocardiogram 2D Echocardiogram has been performed.  Todd [name] 09/08/2011, 3:52 PM

## 2011-09-08 NOTE — Progress Notes (Signed)
Subjective: Patient w/o CP; having significant SOB and agitation this am.  Objective: Vital signs in last 24 hours: Temp:  [97 F (36.1 C)-97.8 F (36.6 C)] 97.6 F (36.4 C) (11/07 0829) Pulse Rate:  [88-120] 111  (11/07 0848) Resp:  [21-38] 27  (11/07 0848) BP: (110-133)/(57-83) 125/83 mmHg (11/07 0848) SpO2:  [93 %-98 %] 95 % (11/07 0850) Weight change:  Last BM Date: 09/04/11  Intake/Output from previous day: 11/06 0701 - 11/07 0700 In: 1265 [P.O.:800; I.V.:40; IV Piggyback:425] Out: 952 [Urine:952] Total I/O In: 252.5 [P.O.:240; IV Piggyback:12.5] Out: 300 [Urine:300]   Physical Exam: General: Alert, awake, oriented x2, in mild acute distress 2/2 SOB. HEENT: No bruits, no goiter. Heart: irregular, no rubs, no gallops and no JVD. Lungs: positive wheezing bilaterally; decreased breath sounds at bases; no rales. Abdomen: Soft, nontender, nondistended, positive bowel sounds. Extremities: No clubbing cyanosis or edema with positive pedal pulses. Neuro: Grossly intact, nonfocal.    Lab Results: Basic Metabolic Panel:  Basename 09/08/11 0345 09/07/11 0425 09/06/11 1145  NA 138 139 --  K 3.8 4.0 --  CL 102 101 --  CO2 25 20 --  GLUCOSE 142* 171* --  BUN 36* 32* --  CREATININE 1.26 1.34 --  CALCIUM 9.8 10.0 --  MG -- -- 2.1  PHOS -- -- --   Liver Function Tests:  Basename 09/07/11 0425 09/06/11 1145  AST 24 17  ALT 11 9  ALKPHOS 78 86  BILITOT 0.2* 0.2*  PROT 7.0 7.1  ALBUMIN 3.4* 3.5   CBC:  Basename 09/07/11 1139 09/07/11 0425 09/06/11 1145  WBC 16.2* 13.3* --  NEUTROABS -- -- 12.4*  HGB 8.9* 9.6* --  HCT 27.4* 29.3* --  MCV 81.5 80.7 --  PLT 298 349 --   Cardiac Enzymes:  Basename 09/07/11 0803 09/06/11 2345 09/06/11 1754  CKTOTAL 276* 196 144  CKMB 13.8* 10.9* 8.7*  CKMBINDEX -- -- --  TROPONINI 2.76* 1.16* 1.73*   BNP:  Basename 09/06/11 2345 09/06/11 1145  POCBNP 5432.0* 1546.0*   CBG:  Basename 09/08/11 0802 09/07/11 2215  09/07/11 1716 09/06/11 1122  GLUCAP 148* 134* 155* 182*   Coagulation:  Basename 09/06/11 1145  LABPROT 18.4*  INR 1.50*   Misc. Labs:  Recent Results (from the past 240 hour(s))  CULTURE, BLOOD (ROUTINE X 2)     Status: Normal (Preliminary result)   Collection Time   09/06/11 11:31 AM      Component Value Range Status Comment   Specimen Description BLOOD LEFT ARM   Final    Special Requests BOTTLES DRAWN AEROBIC ONLY 10CC   Final    Setup Time [id number]   Final    Culture     Final    Value:        BLOOD CULTURE RECEIVED NO GROWTH TO DATE CULTURE WILL BE HELD FOR 5 DAYS BEFORE ISSUING A FINAL NEGATIVE REPORT   Report Status PENDING   Incomplete   CULTURE, BLOOD (ROUTINE X 2)     Status: Normal (Preliminary result)   Collection Time   09/06/11 11:50 AM      Component Value Range Status Comment   Specimen Description BLOOD LEFT WRIST   Final    Special Requests BOTTLES DRAWN AEROBIC ONLY 10CC   Final    Setup Time [id number]   Final    Culture     Final    Value:        BLOOD CULTURE RECEIVED NO GROWTH TO DATE CULTURE  WILL BE HELD FOR 5 DAYS BEFORE ISSUING A FINAL NEGATIVE REPORT   Report Status PENDING   Incomplete   GRAM STAIN     Status: Normal   Collection Time   09/06/11 12:02 PM      Component Value Range Status Comment   Specimen Description SPUTUM   Final    Special Requests NONE   Final    Gram Stain     Final    Value: FEW WBC PRESENT,BOTH PMN AND MONONUCLEAR     ABUNDANT GRAM POSITIVE COCCI IN PAIRS IN CLUSTERS     ABUNDANT GRAM POSITIVE COCCI IN PAIRS IN CHAINS     RARE GRAM POSITIVE RODS     RARE GRAM NEGATIVE RODS   Report Status 09/06/2011 FINAL   Final   CULTURE, RESPIRATORY     Status: Normal   Collection Time   09/06/11 12:02 PM      Component Value Range Status Comment   Specimen Description SPUTUM   Final    Special Requests NONE   Final    Gram Stain     Final    Value: FEW WBC PRESENT,BOTH PMN AND MONONUCLEAR     ABUNDANT SQUAMOUS EPITHELIAL  CELLS PRESENT     ABUNDANT GRAM POSITIVE COCCI IN PAIRS AND CHAINS     IN CLUSTERS RARE GRAM POSITIVE RODS     RARE GRAM NEGATIVE RODS   Culture NORMAL OROPHARYNGEAL FLORA   Final    Report Status 09/08/2011 FINAL   Final   URINE CULTURE     Status: Normal   Collection Time   09/06/11  2:03 PM      Component Value Range Status Comment   Specimen Description URINE, CLEAN CATCH   Final    Special Requests NONE   Final    Setup Time [id number]   Final    Colony Count NO GROWTH   Final    Culture NO GROWTH   Final    Report Status 09/07/2011 FINAL   Final   CULTURE, BLOOD (ROUTINE X 2)     Status: Normal (Preliminary result)   Collection Time   09/06/11  5:10 PM      Component Value Range Status Comment   Specimen Description BLOOD RIGHT ARM   Final    Special Requests BOTTLES DRAWN AEROBIC AND ANAEROBIC 10CC EACH   Final    Setup Time [id number]   Final    Culture     Final    Value:        BLOOD CULTURE RECEIVED NO GROWTH TO DATE CULTURE WILL BE HELD FOR 5 DAYS BEFORE ISSUING A FINAL NEGATIVE REPORT   Report Status PENDING   Incomplete   CULTURE, BLOOD (ROUTINE X 2)     Status: Normal (Preliminary result)   Collection Time   09/06/11  5:19 PM      Component Value Range Status Comment   Specimen Description BLOOD LEFT ARM   Final    Special Requests BOTTLES DRAWN AEROBIC AND ANAEROBIC 10CC EACH   Final    Setup Time [id number]   Final    Culture     Final    Value:        BLOOD CULTURE RECEIVED NO GROWTH TO DATE CULTURE WILL BE HELD FOR 5 DAYS BEFORE ISSUING A FINAL NEGATIVE REPORT   Report Status PENDING   Incomplete   MRSA PCR SCREENING     Status: Normal   Collection Time   09/06/11  5:33 PM      Component Value Range Status Comment   MRSA by PCR NEGATIVE  NEGATIVE  Final     Studies/Results: Dg Chest Portable 1 View  09/06/2011  *RADIOLOGY REPORT*  Clinical Data: Shortness of breath, chest pain, some difficulty breathing  PORTABLE CHEST - 1 VIEW  Comparison: Portable  chest x-ray of 06/18/2011  Findings: There is poorly defined airspace disease right greater than left.  Pneumonia cannot be excluded with edema less likely in view of the asymmetry and peripheral distribution.  Cardiomegaly is stable.  No effusion is seen.  IMPRESSION: Patchy airspace disease bilaterally.  Favor multifocal pneumonia. Recommend follow-up two-view chest x-ray  Original Report Authenticated By: Juline Patch, M.D.    Medications: Scheduled Meds:   . aspirin  324 mg Oral Once  . aspirin EC  81 mg Oral Daily  . calcium-vitamin D  1 tablet Oral Daily  . diltiazem  30 mg Oral Q6H  . fluticasone  1 spray Each Nare Daily  . Fluticasone-Salmeterol  1 puff Inhalation Q12H  . insulin aspart  0-5 Units Subcutaneous QHS  . insulin aspart  0-9 Units Subcutaneous TID WC  . ipratropium  0.5 mg Nebulization Q6H  . levofloxacin (LEVAQUIN) IV  750 mg Intravenous Q48H  . multivitamins ther. w/minerals  1 tablet Oral Daily  . pantoprazole  40 mg Oral BID  . piperacillin-tazobactam (ZOSYN)  IV  3.375 g Intravenous Q8H  . rivaroxaban  15 mg Oral Daily  . simvastatin  10 mg Oral q1800  . vancomycin  1,000 mg Intravenous Q24H  . DISCONTD: dabigatran  150 mg Oral Q12H  . DISCONTD: rivaroxaban  15 mg Oral Daily  . DISCONTD: vancomycin  750 mg Intravenous Q12H   Continuous Infusions:   . DISCONTD: sodium chloride 75 mL/hr at 09/06/11 2345   PRN Meds:.acetaminophen, acetaminophen, albuterol, ALPRAZolam, morphine injection, ondansetron (ZOFRAN) IV, ondansetron, senna, sodium chloride  Assessment/Plan: 1-HCAP: still with significant SOB. Will repeat x-ray and continue antibiotics. Depending results will discussed with pulmonary service. Continue chest toilet per respiratory service.  2-Sepsis: continue IV antibiotics. appears to be secondary to HCAP. Blood cultures negative. 3-Chest pain: Per cardiology secondary to demand ischemia. Currently awaiting 2-D echo results. Will follow  rec's. 4-Atrial fibrillation: per cardiology. Rate is better controlled now. 5-Acute respiratory failure with hypoxia: continue tx for pneumonia; continue nebs tx and also oxygen supplementation. 6-anxiety and insomnia: will use prn xanax; add trazodone as needed and will also use low dose morphine to cut respiratory agony.    LOS: 2 days   Burlene Montecalvo 09/08/2011, 9:13 AM

## 2011-09-08 NOTE — Consult Note (Addendum)
Consult Note from the Palliative Medicine Team at [facility]  Consult Requested by: Dr. Daleen [name]    PCP: Dr. Barnie [name] Reason for Consultation:Goals of care              Phone Number: Especially related to question regarding anticoagulation. Assessment and Plan: 1. Code Status Full Code, family not able to fully address all of goals of care at this time do to the distracting question of the patients anticoagulation and complex course post hospital stay during last admission. 2. Symptom Control:Pneumonia per primary team, until we are able to establish goals of care pt desires full treatment course. Insomnia: spoke with Dr. Edmund [name]. Pt not sleeping which is making him very anxious and is affecting his breathing.  At the point where it is felt to be safe for the patient the addition of trazadone or seroquel low does would be appropriate to help with sleep. Benzodiazipines would be ok in low dose but would be second line in this case. I have made no changes in his current meds at Dr. Genelle [name] request. 3. Psycho/Social: To be addressed at next regoal as family is having trouble with talking about the full issue since they are upset with previous issues that they do not feel have been discussed.  I have suggested trying to get a meeting with the cardiologists to discuss their concerns.  I will coordinate this for them in the am.  They understand that the patient is ill and could decompensate.  Todd [name], patients daughter, expressed that they would want to try to get him over a treatable illness even if that included ventilatory support and CPR.   4. Spiritual Unable to be fully distressed due to distracting issues mentioned above 5. Disposition: Pt will continue with aggressive care as outlined prior to our evaluation and I will attempt to bring the family and Dr. Tenny [name] or Dr. Daleen [name] together to help with communication.*  Patient Documents Completed or Given: Document Given Completed  Advanced Directives Pkt no     MOST no   DNR no   Gone from My Sight no   Hard Choices no     Brief HPI:Pt is an 77 yr old white male with a known past medical history for atrial fib for which he was on Pradaxa this summer.  He subsequently suffered a stroke while on Pradaxa and was taken of the drug.  He evidently had some spontaneously GI tract bleeding in the past as well.Subsequently, he developed a cough with some shortness of breath and altered mental status.  He waited 1 wk be for coming to the hospital for evaluaition. Upon arrival to ER the patient was hypotensive and confused with significant dyspnea.  The patient was treated with aggressive pulmonary toilet and antibiotics and today seemed to be making strides to doing better.  I met with the patients son Todd [name], daughter Todd [name] and her son and the Patients wife.  The patient himself stated that he was feeling pretty poorly and that he was not sleeping well.  He needed "strong medicine" to just put him out.   ROS: Unable to to obtain a complete history secondary to patient anxiety    PMH:  Past Medical History  Diagnosis Date  . Asthma   . Hypertension   . Diabetes mellitus   . Cancer   . A-fib   . Hyperlipidemia   . Colon cancer   . Shortness of breath      PSH: Past Surgical History  Procedure  Date  . Colon surgery     Partial hemecolectomy   . Central line insertion 09/08/2011         No Known Allergies Scheduled Meds:   . albuterol  2.5 mg Nebulization Q6H  . aspirin  324 mg Oral Once  . aspirin EC  81 mg Oral Daily  . diltiazem  30 mg Oral Q6H  . insulin aspart  0-5 Units Subcutaneous QHS  . insulin aspart  0-9 Units Subcutaneous TID WC  . ipratropium  0.5 mg Nebulization Q6H  . levofloxacin (LEVAQUIN) IV  750 mg Intravenous Q48H  . pantoprazole (PROTONIX) IV  40 mg Intravenous Q24H  . piperacillin-tazobactam (ZOSYN)  IV  3.375 g Intravenous Q8H  . simvastatin  10 mg Oral q1800  . vancomycin  1,000 mg Intravenous Q24H  . DISCONTD:  calcium-vitamin D  1 tablet Oral Daily  . DISCONTD: dabigatran  150 mg Oral Q12H  . DISCONTD: fluticasone  1 spray Each Nare Daily  . DISCONTD: Fluticasone-Salmeterol  1 puff Inhalation Q12H  . DISCONTD: multivitamins ther. w/minerals  1 tablet Oral Daily  . DISCONTD: pantoprazole  40 mg Oral BID  . DISCONTD: rivaroxaban  15 mg Oral Daily  . DISCONTD: rivaroxaban  15 mg Oral Daily   Continuous Infusions:   . sodium chloride     PRN Meds:.acetaminophen, acetaminophen, fentaNYL, midazolam, morphine injection, ondansetron (ZOFRAN) IV, sodium chloride, DISCONTD: albuterol, DISCONTD: ALPRAZolam, DISCONTD: ondansetron, DISCONTD: senna, DISCONTD: traZODone    BP 84/41  Pulse 90  Temp(Src) 97.6 F (36.4 C) (Oral)  Resp 18  Ht 6\' 1"  (1.854 m)  Wt 75.3 kg (166 lb 0.1 oz)  BMI 21.90 kg/m2  SpO2 100%   PPS:30%   Intake/Output Summary (Last 24 hours) at 09/08/11 1401 Last data filed at 09/08/11 1610  Gross per 24 hour  Intake  662.5 ml  Output    852 ml  Net -189.5 ml   LBM:0                       Stool Softner:Discontinued  Physical Exam:  General: Awake , alert, slightly anxious but able to complete sentences.  He sounds congested nasally., Mucous membranes dry. Neck supple, no JVD HEENT:  Pupils are equal round and reactive to light  Chest:   Chest decreased air entry , basilar rhonchi right greater than left, no wheezing CVS: Irregularly, Irregular, +S1, S2   Abdomen:Soft, obese, non tender, Positive bowel sounds Ext: 2 plus edema bilaterally, 2+ pulses Neuro:anxious, awake, alert oriented to family , place and general time.    Labs: CBC    Component Value Date/Time   WBC 16.4* 09/08/2011 1015   RBC 3.55* 09/08/2011 1015   HGB 9.5* 09/08/2011 1015   HCT 29.2* 09/08/2011 1015   PLT 307 09/08/2011 1015   MCV 82.3 09/08/2011 1015   MCH 26.8 09/08/2011 1015   MCHC 32.5 09/08/2011 1015   RDW 19.0* 09/08/2011 1015   LYMPHSABS 0.5* 09/06/2011 1145   MONOABS 0.5 09/06/2011 1145     EOSABS 0.2 09/06/2011 1145   BASOSABS 0.1 09/06/2011 1145     CMP  Results for Todd [name] (MRN [id number]) as of 09/08/2011 20:03  09/07/2011 04:25 Sodium: 139 Potassium: 4.0 Chloride: 101 CO2: 20 BUN: 32 (H) Creat: 1.34 Calcium: 10.0 GFR calc non Af Amer: 47 (L) GFR calc Af Amer: 54 (L) Glucose: 171 (H) Alkaline Phosphatase: 78 Albumin: 3.4 (L) AST: 24 ALT: 11 Total Protein: 7.0 Total Bilirubin:  0.2 (L)   Chest Xray Reviewed/Impressions:IMPRESSION: Worsening consolidation in the right upper lobe compatible with worsening pneumonia. Stable patchy left upper lobe opacity.         Time In Time Out Total Time Spent with Patient Total Overall Time  5:00 pm 630 pm 35 min 90 min    Greater than 50%  of this time was spent counseling and coordinating care related to the above assessment and plan.   Arnesia Vincelette L. Ladona Ridgel, MD MBA The Palliative Medicine Team at [facility] Phone: [phone] Pager: [phone]

## 2011-09-08 NOTE — Progress Notes (Signed)
Speech Language/Pathology SLP treatment session for diet tolerance assessment cancelled due to respiratory issues.  RN reports that patient has tolerated his current diet without any s/s of a dysphagia or aspiration, just as SLP on 11/6 determined.  Will f/u 1x when stable to ensure findings.

## 2011-09-08 NOTE — Procedures (Signed)
Central Venous Catheter Insertion Procedure Note Todd [name] [id number] [birth year]  Procedure: Insertion of Central Venous Catheter Indications: Assessment of intravascular volume  Procedure Details Consent: Risks of procedure as well as the alternatives and risks of each were explained to the (patient/caregiver).  Consent for procedure obtained. Time Out: Verified patient identification, verified procedure, site/side was marked, verified correct patient position, special equipment/implants available, medications/allergies/relevent history reviewed, required imaging and test results available.  Performed  Maximum sterile technique was used including antiseptics, cap, gloves, gown, hand hygiene, mask and sheet. Skin prep: Chlorhexidine; local anesthetic administered A antimicrobial bonded/coated triple lumen catheter was placed in the right internal jugular vein using the Seldinger technique.  Evaluation Blood flow good Complications: No apparent complications Patient did tolerate procedure well. Chest X-ray ordered to verify placement.  CXR: pending.  MINOR,Todd [name] 09/08/2011, 1:37 PM

## 2011-09-08 NOTE — Progress Notes (Addendum)
Subjective: Patient complains of SOB; no chest pain   Physical Exam:  Blood pressure 133/71, pulse 88, temperature 97.3 F (36.3 C), temperature source Core (Comment), resp. rate 28, height 6' (1.829 m), weight 161 lb 9.6 oz (73.3 kg), SpO2 94.00%. Temp:  [97 F (36.1 C)-97.9 F (36.6 C)] 97.3 F (36.3 C) (11/07 0500) Pulse Rate:  [84-116] 88  (11/07 0156) Resp:  [21-28] 28  (11/07 0156) BP: (109-133)/(57-71) 133/71 mmHg (11/07 0156) SpO2:  [94 %-98 %] 94 % (11/07 0156) Weight change:  I/O 11/06 0701 - 11/07 0700 In: 1265 [P.O.:800; I.V.:40; IV Piggyback:425] Out: 952 [Urine:952]  HEENT-normal/normal eyelids Neck supple chest - diminished BS; mild exp wheeze CV - irregular/normal S1 and S2; distant heart sounds Abdomen -NT/ND, no HSM, no mass, + bowel sounds, no bruit 2+ femoral pulses, no bruits Ext-no edema, chords, 2+ DP Neuro-grossly nonfocal  Results for orders placed during the hospital encounter of 09/06/11 (from the past 48 hour(s))  GLUCOSE, CAPILLARY     Status: Abnormal   Collection Time   09/06/11 11:22 AM      Component Value Range Comment   Glucose-Capillary 182 (*) 70 - 99 (mg/dL)   CULTURE, BLOOD (ROUTINE X 2)     Status: Normal (Preliminary result)   Collection Time   09/06/11 11:31 AM      Component Value Range Comment   Specimen Description BLOOD LEFT ARM      Special Requests BOTTLES DRAWN AEROBIC ONLY 10CC      Setup Time [id number]      Culture        Value:        BLOOD CULTURE RECEIVED NO GROWTH TO DATE CULTURE WILL BE HELD FOR 5 DAYS BEFORE ISSUING A FINAL NEGATIVE REPORT   Report Status PENDING     CBC     Status: Abnormal   Collection Time   09/06/11 11:45 AM      Component Value Range Comment   WBC 13.7 (*) 4.0 - 10.5 (K/uL)    RBC 3.85 (*) 4.22 - 5.81 (MIL/uL)    Hemoglobin 10.3 (*) 13.0 - 17.0 (g/dL)    HCT 14.7 (*) 82.9 - 52.0 (%)    MCV 82.3  78.0 - 100.0 (fL)    MCH 26.8  26.0 - 34.0 (pg)    MCHC 32.5  30.0 - 36.0 (g/dL)    RDW  56.2 (*) 13.0 - 15.5 (%)    Platelets 329  150 - 400 (K/uL)   DIFFERENTIAL     Status: Abnormal   Collection Time   09/06/11 11:45 AM      Component Value Range Comment   Neutrophils Relative 91 (*) 43 - 77 (%)    Neutro Abs 12.4 (*) 1.7 - 7.7 (K/uL)    Lymphocytes Relative 4 (*) 12 - 46 (%)    Lymphs Abs 0.5 (*) 0.7 - 4.0 (K/uL)    Monocytes Relative 4  3 - 12 (%)    Monocytes Absolute 0.5  0.1 - 1.0 (K/uL)    Eosinophils Relative 2  0 - 5 (%)    Eosinophils Absolute 0.2  0.0 - 0.7 (K/uL)    Basophils Relative 0  0 - 1 (%)    Basophils Absolute 0.1  0.0 - 0.1 (K/uL)   COMPREHENSIVE METABOLIC PANEL     Status: Abnormal   Collection Time   09/06/11 11:45 AM      Component Value Range Comment   Sodium 141  135 - 145 (  mEq/L)    Potassium 3.6  3.5 - 5.1 (mEq/L)    Chloride 102  96 - 112 (mEq/L)    CO2 24  19 - 32 (mEq/L)    Glucose, Bld 169 (*) 70 - 99 (mg/dL)    BUN 25 (*) 6 - 23 (mg/dL)    Creatinine, Ser 1.61  0.50 - 1.35 (mg/dL)    Calcium 9.6  8.4 - 10.5 (mg/dL)    Total Protein 7.1  6.0 - 8.3 (g/dL)    Albumin 3.5  3.5 - 5.2 (g/dL)    AST 17  0 - 37 (U/L)    ALT 9  0 - 53 (U/L)    Alkaline Phosphatase 86  39 - 117 (U/L)    Total Bilirubin 0.2 (*) 0.3 - 1.2 (mg/dL)    GFR calc non Af Amer 50 (*) >90 (mL/min)    GFR calc Af Amer 58 (*) >90 (mL/min)   CARDIAC PANEL(CRET KIN+CKTOT+MB+TROPI)     Status: Abnormal   Collection Time   09/06/11 11:45 AM      Component Value Range Comment   Total CK 130  7 - 232 (U/L)    CK, MB 6.1 (*) 0.3 - 4.0 (ng/mL)    Troponin I 1.09 (*) <0.30 (ng/mL)    Relative Index 4.7 (*) 0.0 - 2.5    PRO B NATRIURETIC PEPTIDE     Status: Abnormal   Collection Time   09/06/11 11:45 AM      Component Value Range Comment   BNP, POC 1546.0 (*) 0 - 450 (pg/mL)   PROTIME-INR     Status: Abnormal   Collection Time   09/06/11 11:45 AM      Component Value Range Comment   Prothrombin Time 18.4 (*) 11.6 - 15.2 (seconds)    INR 1.50 (*) 0.00 - 1.49    APTT      Status: Abnormal   Collection Time   09/06/11 11:45 AM      Component Value Range Comment   aPTT 55 (*) 24 - 37 (seconds)   LACTIC ACID, PLASMA     Status: Abnormal   Collection Time   09/06/11 11:45 AM      Component Value Range Comment   Lactic Acid, Venous 3.7 (*) 0.5 - 2.2 (mmol/L)   PROCALCITONIN     Status: Normal   Collection Time   09/06/11 11:45 AM      Component Value Range Comment   Procalcitonin <0.10     MAGNESIUM     Status: Normal   Collection Time   09/06/11 11:45 AM      Component Value Range Comment   Magnesium 2.1  1.5 - 2.5 (mg/dL)   CULTURE, BLOOD (ROUTINE X 2)     Status: Normal (Preliminary result)   Collection Time   09/06/11 11:50 AM      Component Value Range Comment   Specimen Description BLOOD LEFT WRIST      Special Requests BOTTLES DRAWN AEROBIC ONLY 10CC      Setup Time [id number]      Culture        Value:        BLOOD CULTURE RECEIVED NO GROWTH TO DATE CULTURE WILL BE HELD FOR 5 DAYS BEFORE ISSUING A FINAL NEGATIVE REPORT   Report Status PENDING     GRAM STAIN     Status: Normal   Collection Time   09/06/11 12:02 PM      Component Value Range Comment  Specimen Description SPUTUM      Special Requests NONE      Gram Stain        Value: FEW WBC PRESENT,BOTH PMN AND MONONUCLEAR     ABUNDANT GRAM POSITIVE COCCI IN PAIRS IN CLUSTERS     ABUNDANT GRAM POSITIVE COCCI IN PAIRS IN CHAINS     RARE GRAM POSITIVE RODS     RARE GRAM NEGATIVE RODS   Report Status 09/06/2011 FINAL     CULTURE, RESPIRATORY     Status: Normal (Preliminary result)   Collection Time   09/06/11 12:02 PM      Component Value Range Comment   Specimen Description SPUTUM      Special Requests NONE      Gram Stain        Value: FEW WBC PRESENT,BOTH PMN AND MONONUCLEAR     ABUNDANT SQUAMOUS EPITHELIAL CELLS PRESENT     ABUNDANT GRAM POSITIVE COCCI IN PAIRS AND CHAINS     IN CLUSTERS RARE GRAM POSITIVE RODS     RARE GRAM NEGATIVE RODS   Culture Culture reincubated for better  growth      Report Status PENDING     URINALYSIS, ROUTINE W REFLEX MICROSCOPIC     Status: Abnormal   Collection Time   09/06/11  2:03 PM      Component Value Range Comment   Color, Urine YELLOW  YELLOW     Appearance HAZY (*) CLEAR     Specific Gravity, Urine 1.024  1.005 - 1.030     pH 6.0  5.0 - 8.0     Glucose, UA NEGATIVE  NEGATIVE (mg/dL)    Hgb urine dipstick SMALL (*) NEGATIVE     Bilirubin Urine NEGATIVE  NEGATIVE     Ketones, ur NEGATIVE  NEGATIVE (mg/dL)    Protein, ur 30 (*) NEGATIVE (mg/dL)    Urobilinogen, UA 0.2  0.0 - 1.0 (mg/dL)    Nitrite NEGATIVE  NEGATIVE     Leukocytes, UA NEGATIVE  NEGATIVE    URINE CULTURE     Status: Normal   Collection Time   09/06/11  2:03 PM      Component Value Range Comment   Specimen Description URINE, CLEAN CATCH      Special Requests NONE      Setup Time [id number]      Colony Count NO GROWTH      Culture NO GROWTH      Report Status 09/07/2011 FINAL     URINE MICROSCOPIC-ADD ON     Status: Abnormal   Collection Time   09/06/11  2:03 PM      Component Value Range Comment   WBC, UA 3-6  <3 (WBC/hpf)    RBC / HPF 7-10  <3 (RBC/hpf)    Bacteria, UA RARE  RARE     Casts HYALINE CASTS (*) NEGATIVE  GRANULAR CAST   Urine-Other MUCOUS PRESENT     CULTURE, BLOOD (ROUTINE X 2)     Status: Normal (Preliminary result)   Collection Time   09/06/11  5:10 PM      Component Value Range Comment   Specimen Description BLOOD RIGHT ARM      Special Requests BOTTLES DRAWN AEROBIC AND ANAEROBIC 10CC EACH      Setup Time [id number]      Culture        Value:        BLOOD CULTURE RECEIVED NO GROWTH TO DATE CULTURE WILL BE HELD FOR 5 DAYS BEFORE  ISSUING A FINAL NEGATIVE REPORT   Report Status PENDING     CULTURE, BLOOD (ROUTINE X 2)     Status: Normal (Preliminary result)   Collection Time   09/06/11  5:19 PM      Component Value Range Comment   Specimen Description BLOOD LEFT ARM      Special Requests BOTTLES DRAWN AEROBIC AND ANAEROBIC  10CC EACH      Setup Time [id number]      Culture        Value:        BLOOD CULTURE RECEIVED NO GROWTH TO DATE CULTURE WILL BE HELD FOR 5 DAYS BEFORE ISSUING A FINAL NEGATIVE REPORT   Report Status PENDING     MRSA PCR SCREENING     Status: Normal   Collection Time   09/06/11  5:33 PM      Component Value Range Comment   MRSA by PCR NEGATIVE  NEGATIVE    CARDIAC PANEL(CRET KIN+CKTOT+MB+TROPI)     Status: Abnormal   Collection Time   09/06/11  5:54 PM      Component Value Range Comment   Total CK 144  7 - 232 (U/L)    CK, MB 8.7 (*) 0.3 - 4.0 (ng/mL) CRITICAL VALUE NOTED.  VALUE IS CONSISTENT WITH PREVIOUSLY REPORTED AND CALLED VALUE.   Troponin I 1.73 (*) <0.30 (ng/mL)    Relative Index 6.0 (*) 0.0 - 2.5    CARDIAC PANEL(CRET KIN+CKTOT+MB+TROPI)     Status: Abnormal   Collection Time   09/06/11 11:45 PM      Component Value Range Comment   Total CK 196  7 - 232 (U/L)    CK, MB 10.9 (*) 0.3 - 4.0 (ng/mL) CRITICAL VALUE NOTED.  VALUE IS CONSISTENT WITH PREVIOUSLY REPORTED AND CALLED VALUE.   Troponin I 1.16 (*) <0.30 (ng/mL)    Relative Index 5.6 (*) 0.0 - 2.5    PRO B NATRIURETIC PEPTIDE     Status: Abnormal   Collection Time   09/06/11 11:45 PM      Component Value Range Comment   BNP, POC 5432.0 (*) 0 - 450 (pg/mL)   COMPREHENSIVE METABOLIC PANEL     Status: Abnormal   Collection Time   09/07/11  4:25 AM      Component Value Range Comment   Sodium 139  135 - 145 (mEq/L)    Potassium 4.0  3.5 - 5.1 (mEq/L)    Chloride 101  96 - 112 (mEq/L)    CO2 20  19 - 32 (mEq/L)    Glucose, Bld 171 (*) 70 - 99 (mg/dL)    BUN 32 (*) 6 - 23 (mg/dL)    Creatinine, Ser 8.11  0.50 - 1.35 (mg/dL)    Calcium 91.4  8.4 - 10.5 (mg/dL)    Total Protein 7.0  6.0 - 8.3 (g/dL)    Albumin 3.4 (*) 3.5 - 5.2 (g/dL)    AST 24  0 - 37 (U/L)    ALT 11  0 - 53 (U/L)    Alkaline Phosphatase 78  39 - 117 (U/L)    Total Bilirubin 0.2 (*) 0.3 - 1.2 (mg/dL)    GFR calc non Af Amer 47 (*) >90 (mL/min)     GFR calc Af Amer 54 (*) >90 (mL/min)   CBC     Status: Abnormal   Collection Time   09/07/11  4:25 AM      Component Value Range Comment   WBC 13.3 (*) 4.0 -  10.5 (K/uL)    RBC 3.63 (*) 4.22 - 5.81 (MIL/uL)    Hemoglobin 9.6 (*) 13.0 - 17.0 (g/dL)    HCT 86.5 (*) 78.4 - 52.0 (%)    MCV 80.7  78.0 - 100.0 (fL)    MCH 26.4  26.0 - 34.0 (pg)    MCHC 32.8  30.0 - 36.0 (g/dL)    RDW 69.6 (*) 29.5 - 15.5 (%)    Platelets 349  150 - 400 (K/uL)   CARDIAC PANEL(CRET KIN+CKTOT+MB+TROPI)     Status: Abnormal   Collection Time   09/07/11  8:03 AM      Component Value Range Comment   Total CK 276 (*) 7 - 232 (U/L)    CK, MB 13.8 (*) 0.3 - 4.0 (ng/mL) CRITICAL VALUE NOTED.  VALUE IS CONSISTENT WITH PREVIOUSLY REPORTED AND CALLED VALUE.   Troponin I 2.76 (*) <0.30 (ng/mL)    Relative Index 5.0 (*) 0.0 - 2.5    CBC     Status: Abnormal   Collection Time   09/07/11 11:39 AM      Component Value Range Comment   WBC 16.2 (*) 4.0 - 10.5 (K/uL)    RBC 3.36 (*) 4.22 - 5.81 (MIL/uL)    Hemoglobin 8.9 (*) 13.0 - 17.0 (g/dL)    HCT 28.4 (*) 13.2 - 52.0 (%)    MCV 81.5  78.0 - 100.0 (fL)    MCH 26.5  26.0 - 34.0 (pg)    MCHC 32.5  30.0 - 36.0 (g/dL)    RDW 44.0 (*) 10.2 - 15.5 (%)    Platelets 298  150 - 400 (K/uL)   GLUCOSE, CAPILLARY     Status: Abnormal   Collection Time   09/07/11  5:16 PM      Component Value Range Comment   Glucose-Capillary 155 (*) 70 - 99 (mg/dL)   GLUCOSE, CAPILLARY     Status: Abnormal   Collection Time   09/07/11 10:15 PM      Component Value Range Comment   Glucose-Capillary 134 (*) 70 - 99 (mg/dL)   BASIC METABOLIC PANEL     Status: Abnormal   Collection Time   09/08/11  3:45 AM      Component Value Range Comment   Sodium 138  135 - 145 (mEq/L)    Potassium 3.8  3.5 - 5.1 (mEq/L)    Chloride 102  96 - 112 (mEq/L)    CO2 25  19 - 32 (mEq/L)    Glucose, Bld 142 (*) 70 - 99 (mg/dL)    BUN 36 (*) 6 - 23 (mg/dL)    Creatinine, Ser 7.25  0.50 - 1.35 (mg/dL)    Calcium  9.8  8.4 - 10.5 (mg/dL)    GFR calc non Af Amer 50 (*) >90 (mL/min)    GFR calc Af Amer 58 (*) >90 (mL/min)     Dg Chest Portable 1 View  09/06/2011  *RADIOLOGY REPORT*  Clinical Data: Shortness of breath, chest pain, some difficulty breathing  PORTABLE CHEST - 1 VIEW  Comparison: Portable chest x-ray of 06/18/2011  Findings: There is poorly defined airspace disease right greater than left.  Pneumonia cannot be excluded with edema less likely in view of the asymmetry and peripheral distribution.  Cardiomegaly is stable.  No effusion is seen.  IMPRESSION: Patchy airspace disease bilaterally.  Favor multifocal pneumonia. Recommend follow-up two-view chest x-ray  Original Report Authenticated By: Juline Patch, M.D.    Assessment/Plan Patient Active Hospital Problem List:  Pneumonia (09/06/2011)   Plan: continue antibiotics and pulmonary toilet; may need pulmonary evaluation. NSTEMI   Plan: No chest pain; possible demand ischemia; continue ASA. Check echo for LV function. Atrial fibrillation (09/06/2011) Continue xeralto; add low dose cardizem for rate control. Anemia Previous CVA Renal insufficiency - hold diuretics   Olga Millers MD 09/08/2011, 7:30 AM   Returned to reassess; patient severly SOB and has labored breathing; Sat 95%. I am concerned patient is tiring and will require intubation. I have discussed with Dr. Gwenlyn Perking and he will reassess. I have discussed with CCM and they will evaluate. Repeat chest xray pending. Check ABG.

## 2011-09-08 NOTE — Consults (Signed)
Hospital Admission Note Date: 09/08/2011  Patient name: Todd [name] Medical record number: [id number] Date of birth: [birth year] Age: 77 y.o. Gender: male PCP: No primary provider on file.  Medical Service: PCCM,MD  Brief Summary: 77 yo male, PMH AF on Pradaxa, COPD/Asthma, PNA, Bronchiectasis (Dr Craige Cotta, 2009), CVA 8/12 (D/C from SNF 8/12 for same). Admitted by [facility] 09/06/11 for HCAP, Sepsis, NSTEMI (cards), Gram + cocci in sputum. Improved on 11/6. 11/7 increased SOB/WOB, worsening CXR. PCCM requested by cards for further management.   Lines/Tubes:  None  Microbiology/Sepsis markers: Results for orders placed during the hospital encounter of 09/06/11  CULTURE, BLOOD (ROUTINE X 2)     Status: Normal (Preliminary result)   Collection Time   09/06/11 11:31 AM      Component Value Range Status Comment   Specimen Description BLOOD LEFT ARM   Final    Special Requests BOTTLES DRAWN AEROBIC ONLY 10CC   Final    Setup Time [id number]   Final    Culture     Final    Value:        BLOOD CULTURE RECEIVED NO GROWTH TO DATE CULTURE WILL BE HELD FOR 5 DAYS BEFORE ISSUING A FINAL NEGATIVE REPORT   Report Status PENDING   Incomplete   CULTURE, BLOOD (ROUTINE X 2)     Status: Normal (Preliminary result)   Collection Time   09/06/11 11:50 AM      Component Value Range Status Comment   Specimen Description BLOOD LEFT WRIST   Final    Special Requests BOTTLES DRAWN AEROBIC ONLY 10CC   Final    Setup Time [id number]   Final    Culture     Final    Value:        BLOOD CULTURE RECEIVED NO GROWTH TO DATE CULTURE WILL BE HELD FOR 5 DAYS BEFORE ISSUING A FINAL NEGATIVE REPORT   Report Status PENDING   Incomplete   GRAM STAIN     Status: Normal   Collection Time   09/06/11 12:02 PM      Component Value Range Status Comment   Specimen Description SPUTUM   Final    Special Requests NONE   Final    Gram Stain     Final    Value: FEW WBC PRESENT,BOTH PMN AND MONONUCLEAR     ABUNDANT GRAM POSITIVE  COCCI IN PAIRS IN CLUSTERS     ABUNDANT GRAM POSITIVE COCCI IN PAIRS IN CHAINS     RARE GRAM POSITIVE RODS     RARE GRAM NEGATIVE RODS   Report Status 09/06/2011 FINAL   Final   CULTURE, RESPIRATORY     Status: Normal   Collection Time   09/06/11 12:02 PM      Component Value Range Status Comment   Specimen Description SPUTUM   Final    Special Requests NONE   Final    Gram Stain     Final    Value: FEW WBC PRESENT,BOTH PMN AND MONONUCLEAR     ABUNDANT SQUAMOUS EPITHELIAL CELLS PRESENT     ABUNDANT GRAM POSITIVE COCCI IN PAIRS AND CHAINS     IN CLUSTERS RARE GRAM POSITIVE RODS     RARE GRAM NEGATIVE RODS   Culture NORMAL OROPHARYNGEAL FLORA   Final    Report Status 09/08/2011 FINAL   Final   URINE CULTURE     Status: Normal   Collection Time   09/06/11  2:03 PM      Component  Value Range Status Comment   Specimen Description URINE, CLEAN CATCH   Final    Special Requests NONE   Final    Setup Time [id number]   Final    Colony Count NO GROWTH   Final    Culture NO GROWTH   Final    Report Status 09/07/2011 FINAL   Final   CULTURE, BLOOD (ROUTINE X 2)     Status: Normal (Preliminary result)   Collection Time   09/06/11  5:10 PM      Component Value Range Status Comment   Specimen Description BLOOD RIGHT ARM   Final    Special Requests BOTTLES DRAWN AEROBIC AND ANAEROBIC 10CC EACH   Final    Setup Time [id number]   Final    Culture     Final    Value:        BLOOD CULTURE RECEIVED NO GROWTH TO DATE CULTURE WILL BE HELD FOR 5 DAYS BEFORE ISSUING A FINAL NEGATIVE REPORT   Report Status PENDING   Incomplete   CULTURE, BLOOD (ROUTINE X 2)     Status: Normal (Preliminary result)   Collection Time   09/06/11  5:19 PM      Component Value Range Status Comment   Specimen Description BLOOD LEFT ARM   Final    Special Requests BOTTLES DRAWN AEROBIC AND ANAEROBIC 10CC EACH   Final    Setup Time [id number]   Final    Culture     Final    Value:        BLOOD CULTURE RECEIVED NO  GROWTH TO DATE CULTURE WILL BE HELD FOR 5 DAYS BEFORE ISSUING A FINAL NEGATIVE REPORT   Report Status PENDING   Incomplete   MRSA PCR SCREENING     Status: Normal   Collection Time   09/06/11  5:33 PM      Component Value Range Status Comment   MRSA by PCR NEGATIVE  NEGATIVE  Final     Anti-infectives:  Anti-infectives     Start     Dose/Rate Route Frequency Ordered Stop   09/07/11 0000   vancomycin (VANCOCIN) 750 mg in sodium chloride 0.9 % 150 mL IVPB  Status:  Discontinued        750 mg 150 mL/hr over 60 Minutes Intravenous Every 12 hours 09/06/11 1737 09/07/11 1032   09/06/11 1100  piperacillin-tazobactam (ZOSYN) IVPB 3.375 g       3.375 g 12.5 mL/hr over 240 Minutes Intravenous  Once 09/06/11 1056 09/06/11 1629   09/06/11 1100   vancomycin (VANCOCIN) IVPB 1000 mg/200 mL premix        1,000 mg 200 mL/hr over 60 Minutes Intravenous  Once 09/06/11 1056 09/06/11 1200          Best Practice/Protocols:  pradaxa, Xarelto PPI  Consults: Treatment Team:   R[facility] Pricilla Riffle, MD Palliative [facility]    Studies: Dg Chest Portable 1 View  09/06/2011  *RADIOLOGY REPORT*  Clinical Data: Shortness of breath, chest pain, some difficulty breathing  PORTABLE CHEST - 1 VIEW  Comparison: Portable chest x-ray of 06/18/2011  Findings: There is poorly defined airspace disease right greater than left.  Pneumonia cannot be excluded with edema less likely in view of the asymmetry and peripheral distribution.  Cardiomegaly is stable.  No effusion is seen.  IMPRESSION: Patchy airspace disease bilaterally.  Favor multifocal pneumonia. Recommend follow-up two-view chest x-ray  Original Report Authenticated By: Juline Patch, M.D.     Key  Events: 09/08/11 increased SOB/WOB, RR 40s. Move to 2900.       History of Present Illness: 77 years old Caucasian male,  multiple comorbidities, last hospitalized in August 2012 for stroke, subsequently discharged to skilled nursing  facility. Discharged from the skilled nursing facility to home 8/12. Brought to [facility] 09/06/11 with chief complaint of progressive cough and shortness of breath x1 week. +productive of yellowish sputum, no reported fever or chills. Also complainedof chest pain in the ED. Found to be hypoxic, O2 sat in the 80s on room air he was placed on oxygen with some improvement. Found to be hypotensive with a systolic blood pressure in the 80s on presentation and he was bolused with IV fluid with improvement in his systolic blood pressure to the 100. Initial workup in the ED including chest x-ray showed pneumonia and his troponin was elevated at 1.09. His EKG showed chronic atrial fibrillation with no significant changes in ST-T wave segments. Admitted via [facility]. Worsening on 11/7. PCCM conuslted.  Concerns secretion control, aspiration active?   Allergies: Review of patient's allergies indicates no known allergies.  Past Medical History  Diagnosis Date  . Asthma   . Hypertension   . Diabetes mellitus   . Cancer   . A-fib   . Hyperlipidemia   . Colon cancer   . Shortness of breath Bronchiectasis     Past Surgical History  Procedure Date  . Colon surgery     Partial hemecolectomy     History reviewed. No pertinent family history.  History   Social History  . Marital Status: Married    Spouse Name: N/A    Number of Children: N/A  . Years of Education: N/A   Occupational History  . Not on file.   Social History Main Topics  . Smoking status: Former Games developer  . Smokeless tobacco: Not on file  . Alcohol Use: No  . Drug Use: No  . Sexually Active:    Other Topics Concern  . Not on file   Social History Narrative  . No narrative on file    Current Facility-Administered Medications  Medication Dose Route Frequency Provider Last Rate Last Dose  . acetaminophen (TYLENOL) tablet 650 mg  650 mg Oral Q6H PRN Sosan Abdullah   650 mg at 09/08/11 0818   Or  . acetaminophen (TYLENOL)  suppository 650 mg  650 mg Rectal Q6H PRN Baltazar Najjar      . albuterol (PROVENTIL) (5 MG/ML) 0.5% nebulizer solution 2.5 mg  2.5 mg Nebulization Q2H PRN Sosan Abdullah   2.5 mg at 09/08/11 0848  . ALPRAZolam Prudy Feeler) tablet 0.5 mg  0.5 mg Oral Q4H PRN Fayne Norrie, PHARMD   0.5 mg at 09/08/11 0819  . aspirin chewable tablet 324 mg  324 mg Oral Once Na Li, MD      . aspirin EC tablet 81 mg  81 mg Oral Daily Lewayne Bunting, MD      . calcium-vitamin D (OSCAL WITH D) 500-200 MG-UNIT per tablet 1 tablet  1 tablet Oral Daily Sosan Abdullah   1 tablet at 09/07/11 1001  . diltiazem (CARDIZEM) tablet 30 mg  30 mg Oral Q6H Lewayne Bunting, MD      . fluticasone (FLONASE) 50 MCG/ACT nasal spray 1 spray  1 spray Each Nare Daily Sosan Abdullah      . Fluticasone-Salmeterol (ADVAIR) 500-50 MCG/DOSE inhaler 1 puff  1 puff Inhalation Q12H Sosan Abdullah   1 puff at 09/08/11 0848  .  insulin aspart (novoLOG) injection 0-5 Units  0-5 Units Subcutaneous QHS Sosan Abdullah      . insulin aspart (novoLOG) injection 0-9 Units  0-9 Units Subcutaneous TID WC Sosan Abdullah   2 Units at 09/07/11 1830  . ipratropium (ATROVENT) nebulizer solution 0.5 mg  0.5 mg Nebulization Q6H Sosan Abdullah   0.5 mg at 09/08/11 0848  . Levofloxacin (LEVAQUIN) IVPB 750 mg  750 mg Intravenous Q48H Drake Leach Rumbarger, PHARMD   750 mg at 09/06/11 2106  . morphine 2 MG/ML injection 0.5 mg  0.5 mg Intravenous Q4H PRN Vassie Loll, MD      . multivitamins ther. w/minerals tablet 1 tablet  1 tablet Oral Daily Sosan Abdullah   1 tablet at 09/07/11 1000  . ondansetron (ZOFRAN) tablet 4 mg  4 mg Oral Q6H PRN Sosan Abdullah       Or  . ondansetron (ZOFRAN) injection 4 mg  4 mg Intravenous Q6H PRN Sosan Abdullah      . pantoprazole (PROTONIX) EC tablet 40 mg  40 mg Oral BID Sosan Abdullah   40 mg at 09/07/11 2309  . piperacillin-tazobactam (ZOSYN) IVPB 3.375 g  3.375 g Intravenous Q8H Drake Leach Rumbarger, PHARMD   3.375 g at  09/08/11 1610  . rivaroxaban (XARELTO) tablet 15 mg  15 mg Oral Daily Darrol Jump, PA      . senna (SENOKOT) tablet 17.2 mg  2 tablet Oral Daily PRN Sosan Abdullah      . simvastatin (ZOCOR) tablet 10 mg  10 mg Oral q1800 Sosan Abdullah   10 mg at 09/07/11 1800  . sodium chloride (OCEAN) 0.65 % nasal spray 1 spray  1 spray Each Nare PRN Melissa L Taylor   1 spray at 09/08/11 0817  . traZODone (DESYREL) tablet 50 mg  50 mg Oral QHS PRN Vassie Loll, MD      . vancomycin (VANCOCIN) IVPB 1000 mg/200 mL premix  1,000 mg Intravenous Q24H Mickeal Skinner, PHARMD   1,000 mg at 09/07/11 1313  . DISCONTD: 0.9 %  sodium chloride infusion   Intravenous Continuous Pricilla Riffle, MD 75 mL/hr at 09/06/11 2345    . DISCONTD: dabigatran (PRADAXA) capsule 150 mg  150 mg Oral Q12H Darrol Jump, PA      . DISCONTD: Rivaroxaban (XARELTO) tablet TABS 15 mg  15 mg Oral Daily Pricilla Riffle, MD   15 mg at 09/07/11 1004  . DISCONTD: vancomycin (VANCOCIN) 750 mg in sodium chloride 0.9 % 150 mL IVPB  750 mg Intravenous Q12H Drake Leach Rumbarger, PHARMD   750 mg at 09/07/11 0051      Review of Systems:   Constitutional:   No  weight loss, night sweats,  Fevers, chills, fatigue. HEENT:   No headaches,  Difficulty swallowing,  Tooth/dental problems,  Sore throat,                No sneezing, itching, ear ache, nasal congestion, post nasal drip,   CV:  + chest pain,  + Orthopnea, PND, + mild swelling in lower extremities. No anasarca, dizziness, palpitations  GI  No heartburn, indigestion, abdominal pain, nausea, vomiting, diarrhea, change in bowel habits, loss of appetite  Resp: + shortness of breath at rest. + productive cough /c yellow sputum  Skin: no rash or lesions.  GU: no dysuria, change in color of urine, no urgency or frequency.  No flank pain.  MS:  No joint pain or swelling.  No decreased range of motion.  No back pain.  Psych:  No change in mood or affect. No depression or anxiety.  No  memory loss.    Physical Exam:  Filed Vitals:   09/08/11 0800 09/08/11 0829 09/08/11 0848 09/08/11 0850  BP: 125/83  125/83   Pulse: 108  111   Temp:  97.6 F (36.4 C)    TempSrc:  Oral    Resp: 36  27   Height:      Weight:      SpO2: 93%  93% 95%    Gen: Lethargic, obvious difficulty breathing  ENT: No lesions,  mouth clear,  oropharynx clear, no postnasal drip. + mouth breathing  Neck: No JVD, no carotid bruits  Lungs: diminished lung sounds throughout, wet/congested cough. + use accessory muslces  Cardiovascular: IRR, heart sounds distant.  Mild peripheral edema  Abdomen: soft and NT, no HSM,  BS normal  Musculoskeletal: No deformities, no cyanosis or clubbing  Neuro: lethargic  Skin: Warm, no lesions or rashes  Labs Results for orders placed during the hospital encounter of 09/06/11  CULTURE, BLOOD (ROUTINE X 2)      Component Value Range   Specimen Description BLOOD LEFT ARM     Special Requests BOTTLES DRAWN AEROBIC ONLY 10CC     Setup Time [id number]     Culture       Value:        BLOOD CULTURE RECEIVED NO GROWTH TO DATE CULTURE WILL BE HELD FOR 5 DAYS BEFORE ISSUING A FINAL NEGATIVE REPORT   Report Status PENDING    CULTURE, BLOOD (ROUTINE X 2)      Component Value Range   Specimen Description BLOOD LEFT WRIST     Special Requests BOTTLES DRAWN AEROBIC ONLY 10CC     Setup Time [id number]     Culture       Value:        BLOOD CULTURE RECEIVED NO GROWTH TO DATE CULTURE WILL BE HELD FOR 5 DAYS BEFORE ISSUING A FINAL NEGATIVE REPORT   Report Status PENDING    CBC      Component Value Range   WBC 13.7 (*) 4.0 - 10.5 (K/uL)   RBC 3.85 (*) 4.22 - 5.81 (MIL/uL)   Hemoglobin 10.3 (*) 13.0 - 17.0 (g/dL)   HCT 21.3 (*) 08.6 - 52.0 (%)   MCV 82.3  78.0 - 100.0 (fL)   MCH 26.8  26.0 - 34.0 (pg)   MCHC 32.5  30.0 - 36.0 (g/dL)   RDW 57.8 (*) 46.9 - 15.5 (%)   Platelets 329  150 - 400 (K/uL)  DIFFERENTIAL      Component Value Range   Neutrophils  Relative 91 (*) 43 - 77 (%)   Neutro Abs 12.4 (*) 1.7 - 7.7 (K/uL)   Lymphocytes Relative 4 (*) 12 - 46 (%)   Lymphs Abs 0.5 (*) 0.7 - 4.0 (K/uL)   Monocytes Relative 4  3 - 12 (%)   Monocytes Absolute 0.5  0.1 - 1.0 (K/uL)   Eosinophils Relative 2  0 - 5 (%)   Eosinophils Absolute 0.2  0.0 - 0.7 (K/uL)   Basophils Relative 0  0 - 1 (%)   Basophils Absolute 0.1  0.0 - 0.1 (K/uL)  URINALYSIS, ROUTINE W REFLEX MICROSCOPIC      Component Value Range   Color, Urine YELLOW  YELLOW    Appearance HAZY (*) CLEAR    Specific Gravity, Urine 1.024  1.005 - 1.030    pH 6.0  5.0 - 8.0    Glucose, UA NEGATIVE  NEGATIVE (mg/dL)   Hgb urine dipstick SMALL (*) NEGATIVE    Bilirubin Urine NEGATIVE  NEGATIVE    Ketones, ur NEGATIVE  NEGATIVE (mg/dL)   Protein, ur 30 (*) NEGATIVE (mg/dL)   Urobilinogen, UA 0.2  0.0 - 1.0 (mg/dL)   Nitrite NEGATIVE  NEGATIVE    Leukocytes, UA NEGATIVE  NEGATIVE   URINE CULTURE      Component Value Range   Specimen Description URINE, CLEAN CATCH     Special Requests NONE     Setup Time [id number]     Colony Count NO GROWTH     Culture NO GROWTH     Report Status 09/07/2011 FINAL    COMPREHENSIVE METABOLIC PANEL      Component Value Range   Sodium 141  135 - 145 (mEq/L)   Potassium 3.6  3.5 - 5.1 (mEq/L)   Chloride 102  96 - 112 (mEq/L)   CO2 24  19 - 32 (mEq/L)   Glucose, Bld 169 (*) 70 - 99 (mg/dL)   BUN 25 (*) 6 - 23 (mg/dL)   Creatinine, Ser 9.56  0.50 - 1.35 (mg/dL)   Calcium 9.6  8.4 - 21.3 (mg/dL)   Total Protein 7.1  6.0 - 8.3 (g/dL)   Albumin 3.5  3.5 - 5.2 (g/dL)   AST 17  0 - 37 (U/L)   ALT 9  0 - 53 (U/L)   Alkaline Phosphatase 86  39 - 117 (U/L)   Total Bilirubin 0.2 (*) 0.3 - 1.2 (mg/dL)   GFR calc non Af Amer 50 (*) >90 (mL/min)   GFR calc Af Amer 58 (*) >90 (mL/min)  CARDIAC PANEL(CRET KIN+CKTOT+MB+TROPI)      Component Value Range   Total CK 130  7 - 232 (U/L)   CK, MB 6.1 (*) 0.3 - 4.0 (ng/mL)   Troponin I 1.09 (*) <0.30 (ng/mL)    Relative Index 4.7 (*) 0.0 - 2.5   PRO B NATRIURETIC PEPTIDE      Component Value Range   BNP, POC 1546.0 (*) 0 - 450 (pg/mL)  PROTIME-INR      Component Value Range   Prothrombin Time 18.4 (*) 11.6 - 15.2 (seconds)   INR 1.50 (*) 0.00 - 1.49   APTT      Component Value Range   aPTT 55 (*) 24 - 37 (seconds)  LACTIC ACID, PLASMA      Component Value Range   Lactic Acid, Venous 3.7 (*) 0.5 - 2.2 (mmol/L)  PROCALCITONIN      Component Value Range   Procalcitonin <0.10    MAGNESIUM      Component Value Range   Magnesium 2.1  1.5 - 2.5 (mg/dL)  GRAM STAIN      Component Value Range   Specimen Description SPUTUM     Special Requests NONE     Gram Stain       Value: FEW WBC PRESENT,BOTH PMN AND MONONUCLEAR     ABUNDANT GRAM POSITIVE COCCI IN PAIRS IN CLUSTERS     ABUNDANT GRAM POSITIVE COCCI IN PAIRS IN CHAINS     RARE GRAM POSITIVE RODS     RARE GRAM NEGATIVE RODS   Report Status 09/06/2011 FINAL    CULTURE, RESPIRATORY      Component Value Range   Specimen Description SPUTUM     Special Requests NONE     Gram Stain       Value: FEW WBC PRESENT,BOTH  PMN AND MONONUCLEAR     ABUNDANT SQUAMOUS EPITHELIAL CELLS PRESENT     ABUNDANT GRAM POSITIVE COCCI IN PAIRS AND CHAINS     IN CLUSTERS RARE GRAM POSITIVE RODS     RARE GRAM NEGATIVE RODS   Culture NORMAL OROPHARYNGEAL FLORA     Report Status 09/08/2011 FINAL    GLUCOSE, CAPILLARY      Component Value Range   Glucose-Capillary 182 (*) 70 - 99 (mg/dL)  URINE MICROSCOPIC-ADD ON      Component Value Range   WBC, UA 3-6  <3 (WBC/hpf)   RBC / HPF 7-10  <3 (RBC/hpf)   Bacteria, UA RARE  RARE    Casts HYALINE CASTS (*) NEGATIVE    Urine-Other MUCOUS PRESENT    CARDIAC PANEL(CRET KIN+CKTOT+MB+TROPI)      Component Value Range   Total CK 144  7 - 232 (U/L)   CK, MB 8.7 (*) 0.3 - 4.0 (ng/mL)   Troponin I 1.73 (*) <0.30 (ng/mL)   Relative Index 6.0 (*) 0.0 - 2.5   CARDIAC PANEL(CRET KIN+CKTOT+MB+TROPI)      Component Value Range    Total CK 196  7 - 232 (U/L)   CK, MB 10.9 (*) 0.3 - 4.0 (ng/mL)   Troponin I 1.16 (*) <0.30 (ng/mL)   Relative Index 5.6 (*) 0.0 - 2.5   CULTURE, BLOOD (ROUTINE X 2)      Component Value Range   Specimen Description BLOOD RIGHT ARM     Special Requests BOTTLES DRAWN AEROBIC AND ANAEROBIC 10CC EACH     Setup Time [id number]     Culture       Value:        BLOOD CULTURE RECEIVED NO GROWTH TO DATE CULTURE WILL BE HELD FOR 5 DAYS BEFORE ISSUING A FINAL NEGATIVE REPORT   Report Status PENDING    CULTURE, BLOOD (ROUTINE X 2)      Component Value Range   Specimen Description BLOOD LEFT ARM     Special Requests BOTTLES DRAWN AEROBIC AND ANAEROBIC 10CC EACH     Setup Time [id number]     Culture       Value:        BLOOD CULTURE RECEIVED NO GROWTH TO DATE CULTURE WILL BE HELD FOR 5 DAYS BEFORE ISSUING A FINAL NEGATIVE REPORT   Report Status PENDING    COMPREHENSIVE METABOLIC PANEL      Component Value Range   Sodium 139  135 - 145 (mEq/L)   Potassium 4.0  3.5 - 5.1 (mEq/L)   Chloride 101  96 - 112 (mEq/L)   CO2 20  19 - 32 (mEq/L)   Glucose, Bld 171 (*) 70 - 99 (mg/dL)   BUN 32 (*) 6 - 23 (mg/dL)   Creatinine, Ser 5.28  0.50 - 1.35 (mg/dL)   Calcium 41.3  8.4 - 10.5 (mg/dL)   Total Protein 7.0  6.0 - 8.3 (g/dL)   Albumin 3.4 (*) 3.5 - 5.2 (g/dL)   AST 24  0 - 37 (U/L)   ALT 11  0 - 53 (U/L)   Alkaline Phosphatase 78  39 - 117 (U/L)   Total Bilirubin 0.2 (*) 0.3 - 1.2 (mg/dL)   GFR calc non Af Amer 47 (*) >90 (mL/min)   GFR calc Af Amer 54 (*) >90 (mL/min)  CBC      Component Value Range   WBC 13.3 (*) 4.0 - 10.5 (K/uL)   RBC 3.63 (*) 4.22 - 5.81 (MIL/uL)   Hemoglobin  9.6 (*) 13.0 - 17.0 (g/dL)   HCT 16.1 (*) 09.6 - 52.0 (%)   MCV 80.7  78.0 - 100.0 (fL)   MCH 26.4  26.0 - 34.0 (pg)   MCHC 32.8  30.0 - 36.0 (g/dL)   RDW 04.5 (*) 40.9 - 15.5 (%)   Platelets 349  150 - 400 (K/uL)  MRSA PCR SCREENING      Component Value Range   MRSA by PCR NEGATIVE  NEGATIVE   PRO B  NATRIURETIC PEPTIDE      Component Value Range   BNP, POC 5432.0 (*) 0 - 450 (pg/mL)  CARDIAC PANEL(CRET KIN+CKTOT+MB+TROPI)      Component Value Range   Total CK 276 (*) 7 - 232 (U/L)   CK, MB 13.8 (*) 0.3 - 4.0 (ng/mL)   Troponin I 2.76 (*) <0.30 (ng/mL)   Relative Index 5.0 (*) 0.0 - 2.5   CBC      Component Value Range   WBC 16.2 (*) 4.0 - 10.5 (K/uL)   RBC 3.36 (*) 4.22 - 5.81 (MIL/uL)   Hemoglobin 8.9 (*) 13.0 - 17.0 (g/dL)   HCT 81.1 (*) 91.4 - 52.0 (%)   MCV 81.5  78.0 - 100.0 (fL)   MCH 26.5  26.0 - 34.0 (pg)   MCHC 32.5  30.0 - 36.0 (g/dL)   RDW 78.2 (*) 95.6 - 15.5 (%)   Platelets 298  150 - 400 (K/uL)  BASIC METABOLIC PANEL      Component Value Range   Sodium 138  135 - 145 (mEq/L)   Potassium 3.8  3.5 - 5.1 (mEq/L)   Chloride 102  96 - 112 (mEq/L)   CO2 25  19 - 32 (mEq/L)   Glucose, Bld 142 (*) 70 - 99 (mg/dL)   BUN 36 (*) 6 - 23 (mg/dL)   Creatinine, Ser 2.13  0.50 - 1.35 (mg/dL)   Calcium 9.8  8.4 - 08.6 (mg/dL)   GFR calc non Af Amer 50 (*) >90 (mL/min)   GFR calc Af Amer 58 (*) >90 (mL/min)  GLUCOSE, CAPILLARY      Component Value Range   Glucose-Capillary 155 (*) 70 - 99 (mg/dL)  GLUCOSE, CAPILLARY      Component Value Range   Glucose-Capillary 134 (*) 70 - 99 (mg/dL)  GLUCOSE, CAPILLARY      Component Value Range   Glucose-Capillary 148 (*) 70 - 99 (mg/dL)   Comment 1 Notify RN      Imaging results:  Results for orders placed during the hospital encounter of 09/06/11 (from the past 24 hour(s))  CBC     Status: Abnormal   Collection Time   09/07/11 11:39 AM      Component Value Range   WBC 16.2 (*) 4.0 - 10.5 (K/uL)   RBC 3.36 (*) 4.22 - 5.81 (MIL/uL)   Hemoglobin 8.9 (*) 13.0 - 17.0 (g/dL)   HCT 57.8 (*) 46.9 - 52.0 (%)   MCV 81.5  78.0 - 100.0 (fL)   MCH 26.5  26.0 - 34.0 (pg)   MCHC 32.5  30.0 - 36.0 (g/dL)   RDW 62.9 (*) 52.8 - 15.5 (%)   Platelets 298  150 - 400 (K/uL)  GLUCOSE, CAPILLARY     Status: Abnormal   Collection Time    09/07/11  5:16 PM      Component Value Range   Glucose-Capillary 155 (*) 70 - 99 (mg/dL)  GLUCOSE, CAPILLARY     Status: Abnormal   Collection Time  09/07/11 10:15 PM      Component Value Range   Glucose-Capillary 134 (*) 70 - 99 (mg/dL)  BASIC METABOLIC PANEL     Status: Abnormal   Collection Time   09/08/11  3:45 AM      Component Value Range   Sodium 138  135 - 145 (mEq/L)   Potassium 3.8  3.5 - 5.1 (mEq/L)   Chloride 102  96 - 112 (mEq/L)   CO2 25  19 - 32 (mEq/L)   Glucose, Bld 142 (*) 70 - 99 (mg/dL)   BUN 36 (*) 6 - 23 (mg/dL)   Creatinine, Ser 5.78  0.50 - 1.35 (mg/dL)   Calcium 9.8  8.4 - 46.9 (mg/dL)   GFR calc non Af Amer 50 (*) >90 (mL/min)   GFR calc Af Amer 58 (*) >90 (mL/min)  GLUCOSE, CAPILLARY     Status: Abnormal   Collection Time   09/08/11  8:02 AM      Component Value Range   Glucose-Capillary 148 (*) 70 - 99 (mg/dL)   Comment 1 Notify RN       Assessment & Plan   Acute respiratory failure in setting of HCAI/Sepsis  Plan:  Move to 2900/ICU  Anticipate need for ETI/vent support. Will not tolerated BiPAP , contraindicated with asp risk  Urine Legionella   Procalcitonin trend  Lactic acid x 1 to r/o occult septic shock  Abx as ordered, we have current sig robust coverage  Place CVC if worse or lactic greater 4  BD  No steroids at this time, not indicated If intubated, Would meet criteria ALI, and would cvp and calculate ratio  Acute renal failure. Suspect related to acute illness.  Has received Lasix for volume overload.   Plan:  gentle hydration as tolerated  BMET  consider renal US  Consider Dc lasix  NSTEMI Per cards, appreciate recs If RVR to card drip  Atrial fibrillation-chronic, on Pradaxa. BNP 5432.0. Echo pending. Per cards    likley needs intubation If tubed, with culture neg status and limited response to 3 abx, would consider BAL bronch        MINOR,Luc S  09/08/2011

## 2011-09-08 NOTE — Procedures (Signed)
Intubation Procedure Note KEIGAN TAFOYA [id number] 11/30/1923  Procedure: Intubation Indications: Airway protection and maintenance  Procedure Details Consent: Risks of procedure as well as the alternatives and risks of each were explained to the (patient/caregiver).  Consent for procedure obtained. Time Out: Verified patient identification, verified procedure, site/side was marked, verified correct patient position, special equipment/implants available, medications/allergies/relevent history reviewed, required imaging and test results available.  Performed  3   Evaluation Hemodynamic Status: BP stable throughout; O2 sats: transiently fell during during procedure Patient's Current Condition: stable Complications: No apparent complications Patient did tolerate procedure well. Chest X-ray ordered to verify placement.  CXR: pending.   MINOR,Boubacar S 09/08/2011

## 2011-09-08 NOTE — Progress Notes (Signed)
Extensive d/w family additional 35 min. Wife included. We descibed his current circumstances. HE has made it clear to all members that he would not want resusciation if arrest. Will continue short term intubation and medical treatments.

## 2011-09-09 ENCOUNTER — Inpatient Hospital Stay (HOSPITAL_COMMUNITY): Payer: Medicare Other

## 2011-09-09 DIAGNOSIS — I4891 Unspecified atrial fibrillation: Secondary | ICD-10-CM

## 2011-09-09 DIAGNOSIS — A419 Sepsis, unspecified organism: Secondary | ICD-10-CM

## 2011-09-09 DIAGNOSIS — J690 Pneumonitis due to inhalation of food and vomit: Secondary | ICD-10-CM

## 2011-09-09 DIAGNOSIS — J9600 Acute respiratory failure, unspecified whether with hypoxia or hypercapnia: Secondary | ICD-10-CM

## 2011-09-09 LAB — BASIC METABOLIC PANEL
CO2: 25 mEq/L (ref 19–32)
Calcium: 9.1 mg/dL (ref 8.4–10.5)
Chloride: 104 mEq/L (ref 96–112)
Creatinine, Ser: 1.21 mg/dL (ref 0.50–1.35)
GFR calc Af Amer: 61 mL/min — ABNORMAL LOW (ref >90)
Sodium: 137 mEq/L (ref 135–145)

## 2011-09-09 LAB — CBC
Platelets: 243 10*3/uL (ref 150–400)
RBC: 3.1 MIL/uL — ABNORMAL LOW (ref 4.22–5.81)
RDW: 19 % — ABNORMAL HIGH (ref 11.5–15.5)
WBC: 10.8 10*3/uL — ABNORMAL HIGH (ref 4.0–10.5)

## 2011-09-09 LAB — LEGIONELLA ANTIGEN, URINE: Legionella Antigen, Urine: NEGATIVE

## 2011-09-09 LAB — PROCALCITONIN: Procalcitonin: 0.26 ng/mL

## 2011-09-09 LAB — GLUCOSE, CAPILLARY
Glucose-Capillary: 100 mg/dL — ABNORMAL HIGH (ref 70–99)
Glucose-Capillary: 137 mg/dL — ABNORMAL HIGH (ref 70–99)

## 2011-09-09 LAB — VANCOMYCIN, TROUGH: Vancomycin Tr: 12.7 ug/mL (ref 10.0–20.0)

## 2011-09-09 MED ORDER — INFLUENZA VAC TYP A&B SURF ANT IM INJ
0.5000 mL | INJECTION | INTRAMUSCULAR | Status: AC
  Administered 2011-09-10: 0.5 mL via INTRAMUSCULAR
  Filled 2011-09-09: qty 0.5

## 2011-09-09 MED ORDER — NOREPINEPHRINE BITARTRATE 1 MG/ML IJ SOLN
2.0000 ug/min | INTRAMUSCULAR | Status: DC
  Administered 2011-09-09: 2 ug/min via INTRAVENOUS
  Filled 2011-09-09 (×2): qty 4

## 2011-09-09 MED ORDER — ASPIRIN 81 MG PO CHEW
81.0000 mg | CHEWABLE_TABLET | Freq: Every day | ORAL | Status: DC
  Administered 2011-09-09 – 2011-09-10 (×2): 81 mg

## 2011-09-09 MED ORDER — HYDROCORTISONE SOD SUCCINATE 100 MG IJ SOLR
50.0000 mg | Freq: Four times a day (QID) | INTRAMUSCULAR | Status: DC
  Administered 2011-09-09 – 2011-09-12 (×13): 50 mg via INTRAVENOUS
  Filled 2011-09-09 (×17): qty 1

## 2011-09-09 NOTE — Progress Notes (Signed)
eLink Physician-Brief Progress Note Patient Name: Todd [name] DOB: [birth year] MRN: [id number]  Date of Service  09/09/2011   HPI/Events of Note   scd ordered at RN rquest  eICU Interventions        Cylie Dor 09/09/2011, 6:34 PM

## 2011-09-09 NOTE — Progress Notes (Signed)
[facility] - PGY II RESIDENT NOTE   Subjective:  There were no events overnight. However, yesterday, pt was found by RN to be tachypneic with RR in 40s,  therefore, CCM was notified and pt was intubated for airway protection and maintenance as pt would not tolerate BiPAP secondary to aspiration risk. Pt has no acute concerns this morning, comfortable this morning. Currently intubated.   Objective:   Vital Signs:   BP 89/40  Pulse 63  Temp(Src) 98.8 F (37.1 C) (Oral)  Resp 15  Ht 6\' 1"  (1.854 m)  Wt 166 lb 0.1 oz (75.3 kg)  BMI 21.90 kg/m2  SpO2 100%    Intake/Output:  11/07 0701 - 11/08 0700 In: 1248.5 [P.O.:240; I.V.:500; NG/GT:136; IV Piggyback:372.5] Out: 897 [Urine:897]   Physical Exam: General: Vital signs reviewed and noted. In no acute distress; alert, appropriate and cooperative throughout examination. Intubated.  Lungs:  Normal respiratory effort. Coarse breath sounds diffusely.  Heart: RRR. S1 and S2 normal without gallop, murmur, or rubs.  Abdomen:  BS normoactive. Soft, Nondistended, non-tender.  No masses or organomegaly.  Extremities: No pretibial edema.      Labs: Basic Metabolic Panel: Lab 09/09/11 0400 09/08/11 0345 09/07/11 0425 09/06/11 1145  NA 137 138 139 --  K 3.6 3.8 4.0 --  CL 104 102 101 --  CO2 25 25 20  --  GLUCOSE 106* 142* 171* --  BUN 36* 36* 32* --  CREATININE 1.21 1.26 1.34 --  CALCIUM 9.1 9.8 10.0 --  MG -- -- -- 2.1  PHOS -- -- -- --   CBC: Lab 09/09/11 0400 09/08/11 1015 09/07/11 1139 09/07/11 0425 09/06/11 1145  WBC 10.8* 16.4* 16.2* -- --  NEUTROABS -- -- -- -- 12.4*  HGB 8.3* 9.5* 8.9* -- --  HCT 25.6* 29.2* 27.4* -- --  MCV 82.6 82.3 81.5 80.7 82.3  PLT 243 307 298 -- --    Cardiac Enzymes: Lab 09/07/11 0803 09/06/11 2345 09/06/11 1754 09/06/11 1145  CKTOTAL 276* 196 144 130  CKMB 13.8* 10.9* 8.7* 6.1*  CKMBINDEX -- -- -- --  TROPONINI 2.76* 1.16* 1.73* 1.09*    BNP: Lab 09/06/11 2345 09/06/11 1145    POCBNP 5432.0* 1546.0*    CBG: Lab 09/09/11 0352 09/09/11 0028 09/08/11 2005 09/08/11 1625 09/08/11 1215  GLUCAP 100* 100* 121* 178* 145*    Microbiology: CULTURE, BLOOD (ROUTINE X 2)     Status: Normal (Preliminary result)    PENDING   Incomplete   CULTURE, BLOOD (ROUTINE X 2)     Status: Normal (Preliminary result)    PENDING   Incomplete   GRAM STAIN , Sputum       Value: FEW WBC PRESENT,BOTH PMN AND MONONUCLEAR     ABUNDANT GRAM POSITIVE COCCI IN PAIRS IN CLUSTERS     ABUNDANT GRAM POSITIVE COCCI IN PAIRS IN CHAINS     RARE GRAM POSITIVE RODS     RARE GRAM NEGATIVE RODS  CULTURE, RESPIRATORY        Culture NORMAL OROPHARYNGEAL FLORA   Final   URINE CULTURE     Status: Normal   Culture NO GROWTH   Final   MRSA PCR SCREENING     Status: Normal    NEGATIVE  NEGATIVE  Final     Imaging:  Dg Chest Portable 1 View (09/08/2011) Post-intubation - 1. Appropriately positioned support apparatus as above.  No pneumothorax. 2.  No change to minimally improved aeration of the right upper lung with persistent heterogeneous  opacities worrisome for infection.    Dg Chest Portable 1 View (09/08/2011) Pre-intubation -  Worsening consolidation in the right upper lobe compatible with worsening pneumonia.  Stable patchy left upper lobe opacity.     Medications:   Infusions: . sodium chloride 50 mL/hr at 09/09/11 0600    Scheduled Medications: . albuterol  2.5 mg Nebulization Q6H  . antiseptic oral rinse  15 mL Mouth Rinse QID  . aspirin  324 mg Oral Once  . aspirin EC  81 mg Oral Daily  . chlorhexidine  15 mL Mouth Rinse BID  . diltiazem  30 mg Oral Q6H  . insulin aspart  0-9 Units Subcutaneous Q4H  . ipratropium  0.5 mg Nebulization Q6H  . levofloxacin (LEVAQUIN) IV  750 mg Intravenous Q48H  . pantoprazole (PROTONIX) IV  40 mg Intravenous Q24H  . piperacillin-tazobactam (ZOSYN)  IV  3.375 g Intravenous Q8H  . simvastatin  10 mg Oral q1800  . sodium chloride  10 mL Intravenous  Q12H  . sodium chloride  3 mL Intravenous Q12H  . vancomycin  1,000 mg Intravenous Q24H    PRN Medications: acetaminophen, acetaminophen, fentaNYL, midazolam, morphine injection, ondansetron (ZOFRAN) IV, sodium chloride.     Assessment/ Plan:  Pt is a 77 y.o. yo male with a PMHx of non-oxygen dependent COPD, Atrial fibrillation (not on anticoagulation secondary to fall risk), recent stroke in August 2012 with discharge to SNF who was admitted on 09/06/2011 with symptoms of shortness of breath and cough x 1 week, which was determined to be secondary to HCAP, for which he is currently on triple antibiotic therapy. Despite antibiotic treatment, continued to be tachypneic into 40s, requiring intubation on 09/08/11 for airway protection and management. Cardiology was consulted at admission secondary to elevated troponins of 1 that have been uptrending to maximum of 2.6, thought to be due to demand ischemia in setting of HCAP and respiratory failure.    Chest pain - secondary to demand ischemia in setting of acute HCAP and respiratory failure. Remains without chest pain today. Therefore, interventions will be focused on treating underlying infection, continuing with medical therapy at this time.   Continue aspirin, statin, medical management.  Continue treat his acute pulmonary infection per primary team.   Atrial fibrillation - current rate controlled on Diltiazem. Not a great coumadin candidate secondary to risk of falls. Pending palliative care consult for goals of care discussion regarding anticoagulation.  Continue Diltiazem  Pending palliative care evaluation.   Ejection fraction 20-25% (2D Echo 09/08/11) - never cath'ed. Currently not on a BB secondary to hypotension and acute pulmonary compromise. Home ARB on hold secondary to hypotension.  Monitor daily weight, strict I/O.   Respiratory failure, secondary to HCAP: per CCM and primary team. Intubated on 09/08/2011. On triple  antibiotics with Levaquin, Vanc, Zosyn.   This patient's case and plan of care was discussed with attending, Willa Rough, M.D.Johnette Abraham, D.OGlenna Durand, Internal Medicine Resident 09/09/2011, 7:19 AM  Patient seen and examined. I agree with the assessment and plan as detailed above. See also my additional thoughts below.   At this time, it seems unlikely that we will use anticoagulation. But we await other input.  Willa Rough, MD, [facility] 09/09/2011 8:12 AM

## 2011-09-09 NOTE — Progress Notes (Signed)
Speech Language/Pathology Discharge from SLP services due to intubation.  Re-order if/when appropriate.

## 2011-09-09 NOTE — Progress Notes (Addendum)
ANTIBIOTIC CONSULT NOTE - FOLLOW UP  Pharmacy Consult for vancomycin/zosyn/levaquin day#3 for HCAP  No Known Allergies  Patient Measurements: Height: 6\' 1"  (185.4 cm) Weight: 166 lb 0.1 oz (75.3 kg) IBW/kg (Calculated) : 79.9   Vital Signs: Temp: 98.5 F (36.9 C) (11/08 1200) Temp src: Oral (11/08 1200) BP: 103/40 mmHg (11/08 1100) Pulse Rate: 63  (11/08 1119) Intake/Output from previous day: 11/07 0701 - 11/08 0700 In: 1248.5 [P.O.:240; I.V.:500; NG/GT:136; IV Piggyback:372.5] Out: 897 [Urine:897] Intake/Output from this shift: Total I/O In: 283.8 [I.V.:33.8; IV Piggyback:250] Out: 100 [Urine:100]  Labs:  Basename 09/09/11 0400 09/08/11 1015 09/08/11 0345 09/07/11 1139 09/07/11 0425  WBC 10.8* 16.4* -- 16.2* --  HGB 8.3* 9.5* -- 8.9* --  PLT 243 307 -- 298 --  LABCREA -- -- -- -- --  CREATININE 1.21 -- 1.26 -- 1.34   Estimated Creatinine Clearance: 47.5 ml/min (by C-G formula based on Cr of 1.21).    Microbiology: Recent Results (from the past 720 hour(s))  CULTURE, BLOOD (ROUTINE X 2)     Status: Normal (Preliminary result)   Collection Time   09/06/11 11:31 AM      Component Value Range Status Comment   Specimen Description BLOOD LEFT ARM   Final    Special Requests BOTTLES DRAWN AEROBIC ONLY 10CC   Final    Setup Time [id number]   Final    Culture     Final    Value:        BLOOD CULTURE RECEIVED NO GROWTH TO DATE CULTURE WILL BE HELD FOR 5 DAYS BEFORE ISSUING A FINAL NEGATIVE REPORT   Report Status PENDING   Incomplete   CULTURE, BLOOD (ROUTINE X 2)     Status: Normal (Preliminary result)   Collection Time   09/06/11 11:50 AM      Component Value Range Status Comment   Specimen Description BLOOD LEFT WRIST   Final    Special Requests BOTTLES DRAWN AEROBIC ONLY 10CC   Final    Setup Time [id number]   Final    Culture     Final    Value:        BLOOD CULTURE RECEIVED NO GROWTH TO DATE CULTURE WILL BE HELD FOR 5 DAYS BEFORE ISSUING A FINAL NEGATIVE REPORT    Report Status PENDING   Incomplete   GRAM STAIN     Status: Normal   Collection Time   09/06/11 12:02 PM      Component Value Range Status Comment   Specimen Description SPUTUM   Final    Special Requests NONE   Final    Gram Stain     Final    Value: FEW WBC PRESENT,BOTH PMN AND MONONUCLEAR     ABUNDANT GRAM POSITIVE COCCI IN PAIRS IN CLUSTERS     ABUNDANT GRAM POSITIVE COCCI IN PAIRS IN CHAINS     RARE GRAM POSITIVE RODS     RARE GRAM NEGATIVE RODS   Report Status 09/06/2011 FINAL   Final   CULTURE, RESPIRATORY     Status: Normal   Collection Time   09/06/11 12:02 PM      Component Value Range Status Comment   Specimen Description SPUTUM   Final    Special Requests NONE   Final    Gram Stain     Final    Value: FEW WBC PRESENT,BOTH PMN AND MONONUCLEAR     ABUNDANT SQUAMOUS EPITHELIAL CELLS PRESENT     ABUNDANT GRAM POSITIVE COCCI IN PAIRS  AND CHAINS     IN CLUSTERS RARE GRAM POSITIVE RODS     RARE GRAM NEGATIVE RODS   Culture NORMAL OROPHARYNGEAL FLORA   Final    Report Status 09/08/2011 FINAL   Final   URINE CULTURE     Status: Normal   Collection Time   09/06/11  2:03 PM      Component Value Range Status Comment   Specimen Description URINE, CLEAN CATCH   Final    Special Requests NONE   Final    Setup Time [id number]   Final    Colony Count NO GROWTH   Final    Culture NO GROWTH   Final    Report Status 09/07/2011 FINAL   Final   CULTURE, BLOOD (ROUTINE X 2)     Status: Normal (Preliminary result)   Collection Time   09/06/11  5:10 PM      Component Value Range Status Comment   Specimen Description BLOOD RIGHT ARM   Final    Special Requests BOTTLES DRAWN AEROBIC AND ANAEROBIC 10CC EACH   Final    Setup Time [id number]   Final    Culture     Final    Value:        BLOOD CULTURE RECEIVED NO GROWTH TO DATE CULTURE WILL BE HELD FOR 5 DAYS BEFORE ISSUING A FINAL NEGATIVE REPORT   Report Status PENDING   Incomplete   CULTURE, BLOOD (ROUTINE X 2)     Status: Normal  (Preliminary result)   Collection Time   09/06/11  5:19 PM      Component Value Range Status Comment   Specimen Description BLOOD LEFT ARM   Final    Special Requests BOTTLES DRAWN AEROBIC AND ANAEROBIC 10CC EACH   Final    Setup Time [id number]   Final    Culture     Final    Value:        BLOOD CULTURE RECEIVED NO GROWTH TO DATE CULTURE WILL BE HELD FOR 5 DAYS BEFORE ISSUING A FINAL NEGATIVE REPORT   Report Status PENDING   Incomplete   MRSA PCR SCREENING     Status: Normal   Collection Time   09/06/11  5:33 PM      Component Value Range Status Comment   MRSA by PCR NEGATIVE  NEGATIVE  Final     Anti-infectives     Start     Dose/Rate Route Frequency Ordered Stop   09/07/11 1200   vancomycin (VANCOCIN) IVPB 1000 mg/200 mL premix        1,000 mg 200 mL/hr over 60 Minutes Intravenous Every 24 hours 09/07/11 1032     09/07/11 0000   vancomycin (VANCOCIN) 750 mg in sodium chloride 0.9 % 150 mL IVPB  Status:  Discontinued        750 mg 150 mL/hr over 60 Minutes Intravenous Every 12 hours 09/06/11 1737 09/07/11 1032   09/06/11 1800   Levofloxacin (LEVAQUIN) IVPB 750 mg        750 mg 100 mL/hr over 90 Minutes Intravenous Every 48 hours 09/06/11 1737     09/06/11 1100  piperacillin-tazobactam (ZOSYN) IVPB 3.375 g       3.375 g 12.5 mL/hr over 240 Minutes Intravenous  Once 09/06/11 1056 09/06/11 1629   09/06/11 1100   vancomycin (VANCOCIN) IVPB 1000 mg/200 mL premix        1,000 mg 200 mL/hr over 60 Minutes Intravenous  Once 09/06/11 1056 09/06/11 1200   09/06/11  0030  piperacillin-tazobactam (ZOSYN) IVPB 3.375 g       3.375 g 12.5 mL/hr over 240 Minutes Intravenous Every 8 hours 09/06/11 1737            Assessment: Todd [name] is an 75 YOM male admitted for suspected HCAP. Chest Xray 11/5 showed patchy airspace disease bilaterally. Chest Xray today shows increasing airspace disease and he was intubated 11/7. Blood and urine cultures are NGTD, and respiratory culture grew  normal oropharyngeal flora. Scr creatinine is stable with Est CrCl 45-50 ml/min. Vancomycin trough drawn today prior to receiving his 4th dose is slightly subtherapeutic at 12.7 (goal 15-20) and I suspect it will increase slightly once at steady state. Low clinical suspicion for MRSA due to negative respiratory cultures.  Goal of Therapy:  Vancomycin trough level 15-20 mcg/ml  Plan:  Continue vancomycin 1000mg  IV q 24 hours. No change to zosyn or levaquin at this time.  Laurence Slate 09/09/2011,12:58 PM  Plan has been discussed and verified, and I agree with the assessment. Adin Laker, [place] R 09/09/2011

## 2011-09-09 NOTE — Progress Notes (Signed)
Hospital Admission Note Date: 09/09/11  Patient name: Todd [name]           Medical record number: [id number] Date of birth: 10-03-1924        Age: 77 y.o.     Gender: male PCP: No primary provider on file.   Medical Service: PCCM,MD   Brief Summary: 77 yo male, PMH AF on Pradaxa, COPD/Asthma, PNA, Bronchiectasis (Dr Craige Cotta, 2009), CVA 8/12 (D/C from SNF 8/12 for same). Admitted by [facility] 09/06/11 for HCAP, Sepsis, NSTEMI (cards), Gram + cocci in sputum. Improved on 11/6. 11/7 increased SOB/WOB, worsening CXR. PCCM requested by cards for further management. Intubated, likley asp pNA  Lines/Tubes: 11/6 rt Ij >>> 11/6 ETT>>>  Microbiology/Sepsis markers: Results for orders placed during the hospital encounter of 09/06/11   CULTURE, BLOOD (ROUTINE X 2)     Status: Normal (Preliminary result)     Collection Time     09/06/11 11:31 AM       Component  Value  Range  Status  Comment     Specimen Description  BLOOD LEFT ARM     Final       Special Requests  BOTTLES DRAWN AEROBIC ONLY 10CC     Final       Setup Time  [id number]     Final       Culture        Final       Value:         BLOOD CULTURE RECEIVED NO GROWTH TO DATE CULTURE WILL BE HELD FOR 5 DAYS BEFORE ISSUING A FINAL NEGATIVE REPORT     Report Status  PENDING     Incomplete     CULTURE, BLOOD (ROUTINE X 2)     Status: Normal (Preliminary result)     Collection Time     09/06/11 11:50 AM       Component  Value  Range  Status  Comment     Specimen Description  BLOOD LEFT WRIST     Final       Special Requests  BOTTLES DRAWN AEROBIC ONLY 10CC     Final       Setup Time  [id number]     Final       Culture        Final       Value:         BLOOD CULTURE RECEIVED NO GROWTH TO DATE CULTURE WILL BE HELD FOR 5 DAYS BEFORE ISSUING A FINAL NEGATIVE REPORT     Report Status  PENDING     Incomplete     GRAM STAIN     Status: Normal     Collection Time     09/06/11 12:02 PM       Component  Value  Range  Status  Comment    Specimen Description  SPUTUM     Final       Special Requests  NONE     Final       Gram Stain        Final       Value:  FEW WBC PRESENT,BOTH PMN AND MONONUCLEAR        ABUNDANT GRAM POSITIVE COCCI IN PAIRS IN CLUSTERS        ABUNDANT GRAM POSITIVE COCCI IN PAIRS IN CHAINS        RARE GRAM POSITIVE RODS        RARE GRAM NEGATIVE RODS  Report Status  09/06/2011 FINAL     Final     CULTURE, RESPIRATORY     Status: Normal     Collection Time     09/06/11 12:02 PM       Component  Value  Range  Status  Comment     Specimen Description  SPUTUM     Final       Special Requests  NONE     Final       Gram Stain        Final       Value:  FEW WBC PRESENT,BOTH PMN AND MONONUCLEAR        ABUNDANT SQUAMOUS EPITHELIAL CELLS PRESENT        ABUNDANT GRAM POSITIVE COCCI IN PAIRS AND CHAINS        IN CLUSTERS RARE GRAM POSITIVE RODS        RARE GRAM NEGATIVE RODS     Culture  NORMAL OROPHARYNGEAL FLORA     Final       Report Status  09/08/2011 FINAL     Final     URINE CULTURE     Status: Normal     Collection Time     09/06/11  2:03 PM       Component  Value  Range  Status  Comment     Specimen Description  URINE, CLEAN CATCH     Final       Special Requests  NONE     Final       Setup Time  [id number]     Final       Colony Count  NO GROWTH     Final       Culture  NO GROWTH     Final       Report Status  09/07/2011 FINAL     Final     CULTURE, BLOOD (ROUTINE X 2)     Status: Normal (Preliminary result)     Collection Time     09/06/11  5:10 PM       Component  Value  Range  Status  Comment     Specimen Description  BLOOD RIGHT ARM     Final       Special Requests  BOTTLES DRAWN AEROBIC AND ANAEROBIC 10CC EACH     Final       Setup Time  [id number]     Final       Culture        Final       Value:         BLOOD CULTURE RECEIVED NO GROWTH TO DATE CULTURE WILL BE HELD FOR 5 DAYS BEFORE ISSUING A FINAL NEGATIVE REPORT     Report Status  PENDING     Incomplete     CULTURE, BLOOD (ROUTINE  X 2)     Status: Normal (Preliminary result)     Collection Time     09/06/11  5:19 PM       Component  Value  Range  Status  Comment     Specimen Description  BLOOD LEFT ARM     Final       Special Requests  BOTTLES DRAWN AEROBIC AND ANAEROBIC [facility]     Final       Setup Time  [id number]     Final       Culture        Final  Value:         BLOOD CULTURE RECEIVED NO GROWTH TO DATE CULTURE WILL BE HELD FOR 5 DAYS BEFORE ISSUING A FINAL NEGATIVE REPORT     Report Status  PENDING     Incomplete     MRSA PCR SCREENING     Status: Normal     Collection Time     09/06/11  5:33 PM       Component  Value  Range  Status  Comment     MRSA by PCR  NEGATIVE   NEGATIVE   Final       Anti-infectives:   Anti-infectives        Start        Dose/Rate  Route  Frequency  Ordered  Stop     09/07/11 0000      vancomycin (VANCOCIN) 750 mg in sodium chloride 0.9 % 150 mL IVPB  Status:  Discontinued             750 mg  150 mL/hr over 60 Minutes  Intravenous  Every 12 hours  09/06/11 1737  09/07/11 1032     09/06/11 1100    piperacillin-tazobactam (ZOSYN) IVPB 3.375 g           3.375 g  12.5 mL/hr over 240 Minutes  Intravenous   Once  09/06/11 1056  09/06/11 1629     09/06/11 1100      vancomycin (VANCOCIN) IVPB 1000 mg/200 mL premix             1,000 mg  200 mL/hr over 60 Minutes  Intravenous   Once  09/06/11 1056  09/06/11 1200         Best Practice/Protocols:   pradaxa, Xarelto PPI   Consults: Treatment Team:    [facility] Pricilla Riffle, M   Studies:  09/06/2011  *RADIOLOGY REPORT*  Clinical Data: Shortness of breath, chest pain, some difficulty breathing  PORTABLE CHEST - 1 VIEW  Comparison: Portable chest x-ray of 06/18/2011  Findings: There is poorly defined airspace disease right greater than left.  Pneumonia cannot be excluded with edema less likely in view of the asymmetry and peripheral distribution.  Cardiomegaly is stable.  No effusion is seen.  IMPRESSION:  Patchy airspace disease bilaterally.  Favor multifocal pneumonia. Recommend follow-up two-view chest x-ray  Original Report Authenticated By: Juline Patch, M.D.         Key Events:  09/08/11 increased SOB/WOB, RR 40s. Move to 2900.  11/8- intubated, sepsis, DNR established      Physical Exam:    Filed Vitals:     Filed Vitals:   09/09/11 0826  BP: 99/50  Pulse: 60  Temp:   Resp: 15       RUE:[id number]   ENT: ETT   Neck: No JVD   Lungs: ronchi   Cardiovascular: IRR, S1 S2 S3   Abdomen: soft and NT, no HSM,  BS normal   Musculoskeletal: No deformities, no cyanosis or clubbing   Neuro: rass 0  Skin: Warm, no lesions or rashes   Labs:   Assessment & Plan    Acute respiratory failure in setting of HCAI/Sepsis  Plan:  this is likley asp pNA cvp 7, add officially ARDS prot to goal 7 cc/kg pcxr in am  Plat goal 25-30 Even balance goal abg in am  Consider SBT , no extubation planned cva in past will be a barrier Will need further DNI discussions pre extubation  Acute renal failure.  Suspect related to acute illness.  Has received Lasix for volume overload.    Plan: cvp now 7, all lasix dc'ed  gentle hydration as tolerated, maintain at 50 cc/hr  BMET in am   Hypotension, severe sepsis at this stage Lactic cleared, if MAp less 60-65 will consider egdt Have room for bolus Cortisol now then stress steroids Dc cardizem, may need other rate control agent   NSTEMI Per cards, appreciate recs If RVR, may need amio etc, digf? EF reviewed, avoid gross pos balance   Atrial fibrillation-chronic, on Pradaxa. BNP 5432.0. Echo reviewed  DNR Continue medical therapy  Clay Menser

## 2011-09-10 ENCOUNTER — Inpatient Hospital Stay (HOSPITAL_COMMUNITY): Payer: Medicare Other

## 2011-09-10 LAB — POCT I-STAT 3, ART BLOOD GAS (G3+)
Acid-base deficit: 1 mmol/L (ref 0.0–2.0)
O2 Saturation: 99 %
Patient temperature: 98.6

## 2011-09-10 LAB — COMPREHENSIVE METABOLIC PANEL
AST: 19 U/L (ref 0–37)
BUN: 34 mg/dL — ABNORMAL HIGH (ref 6–23)
CO2: 24 mEq/L (ref 19–32)
Calcium: 9 mg/dL (ref 8.4–10.5)
Creatinine, Ser: 1.15 mg/dL (ref 0.50–1.35)
GFR calc Af Amer: 65 mL/min — ABNORMAL LOW (ref >90)
GFR calc non Af Amer: 56 mL/min — ABNORMAL LOW (ref >90)
Glucose, Bld: 131 mg/dL — ABNORMAL HIGH (ref 70–99)
Total Bilirubin: 0.4 mg/dL (ref 0.3–1.2)

## 2011-09-10 LAB — GLUCOSE, CAPILLARY
Glucose-Capillary: 131 mg/dL — ABNORMAL HIGH (ref 70–99)
Glucose-Capillary: 142 mg/dL — ABNORMAL HIGH (ref 70–99)
Glucose-Capillary: 159 mg/dL — ABNORMAL HIGH (ref 70–99)

## 2011-09-10 LAB — DIFFERENTIAL
Basophils Absolute: 0 10*3/uL (ref 0.0–0.1)
Eosinophils Relative: 0 % (ref 0–5)
Lymphocytes Relative: 11 % — ABNORMAL LOW (ref 12–46)
Monocytes Absolute: 0.8 10*3/uL (ref 0.1–1.0)
Monocytes Relative: 10 % (ref 3–12)

## 2011-09-10 LAB — CBC
HCT: 24.8 % — ABNORMAL LOW (ref 39.0–52.0)
Hemoglobin: 8.1 g/dL — ABNORMAL LOW (ref 13.0–17.0)
MCV: 82.4 fL (ref 78.0–100.0)
RDW: 18.6 % — ABNORMAL HIGH (ref 11.5–15.5)
WBC: 8.2 10*3/uL (ref 4.0–10.5)

## 2011-09-10 MED ORDER — HEPARIN BOLUS VIA INFUSION
2500.0000 [IU] | Freq: Once | INTRAVENOUS | Status: AC
  Administered 2011-09-10: 2500 [IU] via INTRAVENOUS

## 2011-09-10 MED ORDER — BIOTENE DRY MOUTH MT LIQD
15.0000 mL | Freq: Two times a day (BID) | OROMUCOSAL | Status: DC
  Administered 2011-09-10 – 2011-09-12 (×4): 15 mL via OROMUCOSAL

## 2011-09-10 MED ORDER — HEPARIN BOLUS VIA INFUSION: 3500.0000 [IU] | Freq: Once | INTRAVENOUS | Status: DC

## 2011-09-10 MED ORDER — HEPARIN (PORCINE) IN NACL 100-0.45 UNIT/ML-% IJ SOLN
1000.0000 [IU]/h | INTRAMUSCULAR | Status: DC
  Administered 2011-09-10: 1000 [IU] via INTRAVENOUS
  Administered 2011-09-11: 1000 [IU]/h via INTRAVENOUS
  Filled 2011-09-10 (×4): qty 250

## 2011-09-10 MED ORDER — SODIUM CHLORIDE 0.9 % IV SOLN
INTRAVENOUS | Status: DC
  Administered 2011-09-10: 20 mL via INTRAVENOUS

## 2011-09-10 NOTE — Progress Notes (Signed)
Hospital Admission Note Date: 09/09/11  Patient name: Todd [name]           Medical record number: [id number] Date of birth: 07-29-1924        Age: 77 y.o.     Gender: male PCP: No primary provider on file.   Medical Service: PCCM,MD  Brief Summary: 77 yo male, PMH AF on Pradaxa, COPD/Asthma, PNA, Bronchiectasis (Dr Craige Cotta, 2009), CVA 8/12 (D/C from SNF 8/12 for same). Admitted [facility] 09/06/11 for HCAP, Sepsis, NSTEMI (cards), Gram + cocci in sputum. Improved on 11/6. 11/7 increased SOB/WOB, worsening CXR. PCCM requested by cards for further management. Intubated, likley asp pNA  Lines/Tubes: 11/6 rt Ij >>> 11/6 ETT>>>  Microbiology/Sepsis markers: Results for orders placed during the hospital encounter of 09/06/11  CULTURE, BLOOD (ROUTINE X 2)     Status: Normal (Preliminary result)   Collection Time   09/06/11 11:31 AM      Component Value Range Status Comment   Specimen Description BLOOD LEFT ARM   Final    Special Requests BOTTLES DRAWN AEROBIC ONLY 10CC   Final    Setup Time [id number]   Final    Culture     Final    Value:        BLOOD CULTURE RECEIVED NO GROWTH TO DATE CULTURE WILL BE HELD FOR 5 DAYS BEFORE ISSUING A FINAL NEGATIVE REPORT   Report Status PENDING   Incomplete   CULTURE, BLOOD (ROUTINE X 2)     Status: Normal (Preliminary result)   Collection Time   09/06/11 11:50 AM      Component Value Range Status Comment   Specimen Description BLOOD LEFT WRIST   Final    Special Requests BOTTLES DRAWN AEROBIC ONLY 10CC   Final    Setup Time [id number]   Final    Culture     Final    Value:        BLOOD CULTURE RECEIVED NO GROWTH TO DATE CULTURE WILL BE HELD FOR 5 DAYS BEFORE ISSUING A FINAL NEGATIVE REPORT   Report Status PENDING   Incomplete   GRAM STAIN     Status: Normal   Collection Time   09/06/11 12:02 PM      Component Value Range Status Comment   Specimen Description SPUTUM   Final    Special Requests NONE   Final    Gram Stain     Final    Value: FEW WBC PRESENT,BOTH PMN AND MONONUCLEAR     ABUNDANT GRAM POSITIVE COCCI IN PAIRS IN CLUSTERS     ABUNDANT GRAM POSITIVE COCCI IN PAIRS IN CHAINS     RARE GRAM POSITIVE RODS     RARE GRAM NEGATIVE RODS   Report Status 09/06/2011 FINAL   Final   CULTURE, RESPIRATORY     Status: Normal   Collection Time   09/06/11 12:02 PM      Component Value Range Status Comment   Specimen Description SPUTUM   Final    Special Requests NONE   Final    Gram Stain     Final    Value: FEW WBC PRESENT,BOTH PMN AND MONONUCLEAR     ABUNDANT SQUAMOUS EPITHELIAL CELLS PRESENT     ABUNDANT GRAM POSITIVE COCCI IN PAIRS AND CHAINS     IN CLUSTERS RARE GRAM POSITIVE RODS     RARE GRAM NEGATIVE RODS   Culture NORMAL OROPHARYNGEAL FLORA   Final    Report Status 09/08/2011 FINAL  Final   URINE CULTURE     Status: Normal   Collection Time   09/06/11  2:03 PM      Component Value Range Status Comment   Specimen Description URINE, CLEAN CATCH   Final    Special Requests NONE   Final    Setup Time [id number]   Final    Colony Count NO GROWTH   Final    Culture NO GROWTH   Final    Report Status 09/07/2011 FINAL   Final   CULTURE, BLOOD (ROUTINE X 2)     Status: Normal (Preliminary result)   Collection Time   09/06/11  5:10 PM      Component Value Range Status Comment   Specimen Description BLOOD RIGHT ARM   Final    Special Requests BOTTLES DRAWN AEROBIC AND ANAEROBIC 10CC EACH   Final    Setup Time [id number]   Final    Culture     Final    Value:        BLOOD CULTURE RECEIVED NO GROWTH TO DATE CULTURE WILL BE HELD FOR 5 DAYS BEFORE ISSUING A FINAL NEGATIVE REPORT   Report Status PENDING   Incomplete   CULTURE, BLOOD (ROUTINE X 2)     Status: Normal (Preliminary result)   Collection Time   09/06/11  5:19 PM      Component Value Range Status Comment   Specimen Description BLOOD LEFT ARM   Final    Special Requests BOTTLES DRAWN AEROBIC AND ANAEROBIC 10CC EACH   Final    Setup Time [id number]    Final    Culture     Final    Value:        BLOOD CULTURE RECEIVED NO GROWTH TO DATE CULTURE WILL BE HELD FOR 5 DAYS BEFORE ISSUING A FINAL NEGATIVE REPORT   Report Status PENDING   Incomplete   MRSA PCR SCREENING     Status: Normal   Collection Time   09/06/11  5:33 PM      Component Value Range Status Comment   MRSA by PCR NEGATIVE  NEGATIVE  Final       Anti-infectives:   Anti-infectives    11/5 vanc>>>11/9 11/5 levo>>> 11/6 zosyn>>>     Best Practice/Protocols:   pradaxa, Xarelto PPI   Consults: Treatment Team:    [facility] Pricilla Riffle, M   Studies:  09/06/2011  *RADIOLOGY REPORT*  Clinical Data: Shortness of breath, chest pain, some difficulty breathing  PORTABLE CHEST - 1 VIEW  Comparison: Portable chest x-ray of 06/18/2011  Findings: There is poorly defined airspace disease right greater than left.  Pneumonia cannot be excluded with edema less likely in view of the asymmetry and peripheral distribution.  Cardiomegaly is stable.  No effusion is seen.  IMPRESSION: Patchy airspace disease bilaterally.  Favor multifocal pneumonia. Recommend follow-up two-view chest x-ray  Original Report Authenticated By: Juline Patch, M.D.     Key Events:  09/08/11 increased SOB/WOB, RR 40s. Move to 2900.  11/8- intubated, sepsis, DNR established    11/9- improved, weaning, low pressor needs   Physical Exam:    Filed Vitals:     Filed Vitals:   09/10/11 0748  BP: 119/56  Pulse: 62  Temp:   Resp: 16       GEX:[id number]   ENT: ETT  wnl  Neck:  wnl JVD   Lungs: ronchi improved   Cardiovascular: IRR, S1 S2 no m   Abdomen: soft and NT,  no HSM,  BS normal   Musculoskeletal: No deformities, no cyanosis or clubbing   Neuro: rass 0  Skin: Warm, no lesions or rashes  Labs:   BMET    Component Value Date/Time   NA 142 09/10/2011 0500   K 3.5 09/10/2011 0500   CL 107 09/10/2011 0500   CO2 24 09/10/2011 0500   GLUCOSE 131* 09/10/2011 0500   BUN 34*  09/10/2011 0500   CREATININE 1.15 09/10/2011 0500   CALCIUM 9.0 09/10/2011 0500   GFRNONAA 56* 09/10/2011 0500   GFRAA 65* 09/10/2011 0500    CBC    Component Value Date/Time   WBC 8.2 09/10/2011 0500   RBC 3.01* 09/10/2011 0500   HGB 8.1* 09/10/2011 0500   HCT 24.8* 09/10/2011 0500   PLT 258 09/10/2011 0500   MCV 82.4 09/10/2011 0500   MCH 26.9 09/10/2011 0500   MCHC 32.7 09/10/2011 0500   RDW 18.6* 09/10/2011 0500   LYMPHSABS 0.9 09/10/2011 0500   MONOABS 0.8 09/10/2011 0500   EOSABS 0.0 09/10/2011 0500   BASOSABS 0.0 09/10/2011 0500    arterial blood gases   Assessment & Plan   Acute respiratory failure in setting of HCAI/Sepsis  Plan:  weaning this am cpap 5 ps 5 goal 1 hours Pcxr, especially rul, improved Protecting airway better Lasix once off pressors Will need swallow assessment after extubation Will discuss dni prior to extubation Assess rsbi pcxr in am  This is likley asp  Acute renal failure. Suspect related to acute illness.  Meets criteria rel AI  Plan:  requirss low dose pressors Lasix held Continue stress steroids, met criteria EF noted, cvp 7  Hypotension, sepsis Septic shock Cleared lactic then hours later requires low pressors Met criteria rel AI, continue steroids until of pressors Levo to MAp goals , likely can wean off cvp 7   NSTEMI Per cards, appreciate recs Tele Echo reviewed   Atrial fibrillation-chronic, on Pradaxa. BNP 5432.0. Echo reviewed  DNR Continue medical therapy  If not extubated, start TF  Fionna Merriott

## 2011-09-10 NOTE — Progress Notes (Signed)
[facility]   Subjective:  There were no events overnight. Pt tolerated weaning of his vent yesterday for several hours. Currently, the patient confirms symptoms of discomfort with his ET tube. he denies symptoms of chest pain, chest pressure/discomfort, dyspnea and palpitations, chills, fevers.    Objective:  Vital Signs:   BP 118/59  Pulse 62  Temp(Src) 98.6 F (37 C) (Tympanic)  Resp 17  Ht 6\' 1"  (1.854 m)  Wt 165 lb 2 oz (74.9 kg)  BMI 21.79 kg/m2  SpO2 100%    Intake/Output:  11/08 0701 - 11/09 0700 In: 1431.7 [I.V.:1081.7; IV Piggyback:350] Out: 653 [Urine:653]   Physical Exam: General: Vital signs reviewed and noted. Well-developed, well-nourished, in no acute distress; alert, appropriate and cooperative throughout examination. Intubated, but alert.  Lungs:  Normal respiratory effort. Improving breath sounds, still mildly coarse, but improved. No wheezes.  Heart: RRR. S1 and S2 normal without gallop, murmur, or rubs.  Abdomen:  BS normoactive. Soft, Nondistended, non-tender.  No masses or organomegaly.  Extremities: No pretibial edema.     Labs: Basic Metabolic Panel: Lab 09/10/11 0500 09/09/11 0400 09/08/11 0345 09/06/11 1145  NA 142 137 138 --  K 3.5 3.6 3.8 --  CL 107 104 102 --  CO2 24 25 25  --  GLUCOSE 131* 106* 142* --  BUN 34* 36* 36* --  CREATININE 1.15 1.21 1.26 --  CALCIUM 9.0 9.1 9.8 --  MG -- -- -- 2.1  PHOS -- -- -- --    Liver Function Tests: Lab 09/10/11 0500 09/07/11 0425 09/06/11 1145  AST 19 24 17   ALT 15 11 9   ALKPHOS 79 78 86  BILITOT 0.4 0.2* 0.2*  PROT 6.0 7.0 7.1  ALBUMIN 2.6* 3.4* 3.5    CBC: Lab 09/10/11 0500 09/09/11 0400 09/08/11 1015 09/07/11 1139 09/07/11 0425 09/06/11 1145  WBC 8.2 10.8* 16.4* -- -- --  NEUTROABS 6.4 -- -- -- -- 12.4*  HGB 8.1* 8.3* 9.5* -- -- --  HCT 24.8* 25.6* 29.2* -- -- --  MCV 82.4 82.6 82.3 81.5 80.7 --  PLT 258 243 307 -- -- --    Cardiac Enzymes: Lab 09/07/11 0803 09/06/11 2345  09/06/11 1754 09/06/11 1145  CKTOTAL 276* 196 144 130  CKMB 13.8* 10.9* 8.7* 6.1*  CKMBINDEX -- -- -- --  TROPONINI 2.76* 1.16* 1.73* 1.09*    BNP: Lab 09/06/11 2345 09/06/11 1145  POCBNP 5432.0* 1546.0*    CBG: Lab 09/10/11 0341 09/09/11 2336 09/09/11 1914 09/09/11 1553 09/09/11 1154  GLUCAP 142* 131* 137* 138* 113*    Microbiology: CULTURE, BLOOD (ROUTINE X 2) Status: Normal (Preliminary result)     PENDING   Incomplete    CULTURE, BLOOD (ROUTINE X 2) Status: Normal (Preliminary result)     PENDING   Incomplete    GRAM STAIN , Sputum    Value:  FEW WBC PRESENT,BOTH PMN AND MONONUCLEAR     ABUNDANT GRAM POSITIVE COCCI IN PAIRS IN CLUSTERS     ABUNDANT GRAM POSITIVE COCCI IN PAIRS IN CHAINS     RARE GRAM POSITIVE RODS     RARE GRAM NEGATIVE RODS   CULTURE, RESPIRATORY    Culture  NORMAL OROPHARYNGEAL FLORA   Final    URINE CULTURE Status: Normal    Culture  NO GROWTH   Final    MRSA PCR SCREENING Status: Normal     NEGATIVE  NEGATIVE  Final      Imaging:  Dg Chest Portable 1 View (09/08/2011) Post-intubation -  1. Appropriately positioned support apparatus as above. No pneumothorax. 2. No change to minimally improved aeration of the right upper lung with persistent heterogeneous opacities worrisome for infection.   Dg Chest Portable 1 View (09/08/2011) Pre-intubation - Worsening consolidation in the right upper lobe compatible with worsening pneumonia. Stable patchy left upper lobe opacity.  Dg Chest Portable 1 View (09/09/2011) - Increasing patchy bilateral airspace disease.      Medications:   Infusions: . sodium chloride 50 mL (09/09/11 2347)  . norepinephrine (LEVOPHED) Adult infusion 2 mcg/min (09/09/11 2100)    Scheduled Medications: . albuterol  2.5 mg Nebulization Q6H  . antiseptic oral rinse  15 mL Mouth Rinse QID  . aspirin  324 mg Oral Once  . aspirin  81 mg Per Tube Daily  . chlorhexidine  15 mL Mouth Rinse BID  . hydrocortisone sodium succinate  50  mg Intravenous Q6H  . influenza (>/= 3 years) inactive virus vaccine  0.5 mL Intramuscular Tomorrow-1000  . insulin aspart  0-9 Units Subcutaneous Q4H  . ipratropium  0.5 mg Nebulization Q6H  . levofloxacin (LEVAQUIN) IV  750 mg Intravenous Q48H  . pantoprazole (PROTONIX) IV  40 mg Intravenous Q24H  . piperacillin-tazobactam (ZOSYN)  IV  3.375 g Intravenous Q8H  . simvastatin  10 mg Oral q1800  . sodium chloride  10 mL Intravenous Q12H  . sodium chloride  3 mL Intravenous Q12H  . vancomycin  1,000 mg Intravenous Q24H    PRN Medications: acetaminophen, acetaminophen, fentaNYL, midazolam, morphine injection, ondansetron (ZOFRAN) IV, sodium chloride    Assessment/ Plan:  Pt is a 77 y.o. yo male with a PMHx of non-oxygen dependent COPD, Atrial fibrillation (not on anticoagulation secondary to fall risk), recent stroke in August 2012 with discharge to SNF who was admitted on 09/06/2011 with symptoms of shortness of breath and cough x 1 week, which was determined to be secondary to HCAP, for which he is currently on triple antibiotic therapy. Despite antibiotic treatment, continued to be tachypneic into 40s, requiring intubation on 09/08/11 for airway protection and management. Cardiology was consulted at admission secondary to elevated troponins of 1 that have been uptrending to maximum of 2.6, thought to be due to demand ischemia in setting of HCAP and respiratory failure.     Chest pain - secondary to demand ischemia in setting of acute HCAP and respiratory failure. Remains without chest pain today. Therefore, interventions will be focused on treating underlying infection, continuing with medical therapy at this time.   Continue aspirin, statin, medical management.   Continue treat his acute pulmonary infection per primary team.   Atrial fibrillation - current rate controlled on Diltiazem. CHADS2 score of 6, therefore, he should be anticoagulated, especially given recent ischemic stroke.  However, is not a great coumadin candidate secondary to risk of falls. Pending palliative care consult for goals of care discussion regarding anticoagulation.   Continue Diltiazem   Pending palliative care evaluation.   Ejection fraction 20-25% (2D Echo 09/08/11) - never cath'ed. Currently not on a BB secondary to hypotension and acute pulmonary compromise. Home ARB on hold secondary to hypotension.   Monitor daily weight, strict I/O.   Respiratory failure, secondary to HCAP: per CCM and primary team. Intubated on 09/08/2011, weaning well yesterday 09/09/2011. On triple antibiotics with Levaquin, Vanc, Zosyn.    This patient's case and plan of care was discussed with attending, Olga Millers, M.D.    Johnette Abraham, Norman Herrlich, Internal Medicine Resident 09/10/2011, 7:18 AM

## 2011-09-10 NOTE — Progress Notes (Signed)
ANTIBIOTIC CONSULT NOTE - FOLLOW UP  Pharmacy Consult for levaquin day#4 for HCAP  No Known Allergies  Patient Measurements: Height: 6\' 1"  (185.4 cm) Weight: 165 lb 2 oz (74.9 kg) IBW/kg (Calculated) : 79.9   Vital Signs: Temp: 98 F (36.7 C) (11/09 1151) Temp src: Oral (11/09 1151) BP: 113/64 mmHg (11/09 1500) Pulse Rate: 69  (11/09 1500) Intake/Output from previous day: 11/08 0701 - 11/09 0700 In: 1431.7 [I.V.:1081.7; IV Piggyback:350] Out: 753 [Urine:753] Intake/Output from this shift: Total I/O In: 465 [I.V.:415; IV Piggyback:50] Out: 226 [Urine:225; Stool:1]  Labs:  [facility] 09/10/11 0500 09/09/11 0400 09/08/11 1015 09/08/11 0345  WBC 8.2 10.8* 16.4* --  HGB 8.1* 8.3* 9.5* --  PLT 258 243 307 --  LABCREA -- -- -- --  CREATININE 1.15 1.21 -- 1.26   Estimated Creatinine Clearance: 49.8 ml/min (by C-G formula based on Cr of 1.15).    Microbiology: Recent Results (from the past 720 hour(s))  CULTURE, BLOOD (ROUTINE X 2)     Status: Normal (Preliminary result)   Collection Time   09/06/11 11:31 AM      Component Value Range Status Comment   Specimen Description BLOOD LEFT ARM   Final    Special Requests BOTTLES DRAWN AEROBIC ONLY 10CC   Final    Setup Time [id number]   Final    Culture     Final    Value:        BLOOD CULTURE RECEIVED NO GROWTH TO DATE CULTURE WILL BE HELD FOR 5 DAYS BEFORE ISSUING A FINAL NEGATIVE REPORT   Report Status PENDING   Incomplete   CULTURE, BLOOD (ROUTINE X 2)     Status: Normal (Preliminary result)   Collection Time   09/06/11 11:50 AM      Component Value Range Status Comment   Specimen Description BLOOD LEFT WRIST   Final    Special Requests BOTTLES DRAWN AEROBIC ONLY 10CC   Final    Setup Time [id number]   Final    Culture     Final    Value:        BLOOD CULTURE RECEIVED NO GROWTH TO DATE CULTURE WILL BE HELD FOR 5 DAYS BEFORE ISSUING A FINAL NEGATIVE REPORT   Report Status PENDING   Incomplete   GRAM STAIN     Status:  Normal   Collection Time   09/06/11 12:02 PM      Component Value Range Status Comment   Specimen Description SPUTUM   Final    Special Requests NONE   Final    Gram Stain     Final    Value: FEW WBC PRESENT,BOTH PMN AND MONONUCLEAR     ABUNDANT GRAM POSITIVE COCCI IN PAIRS IN CLUSTERS     ABUNDANT GRAM POSITIVE COCCI IN PAIRS IN CHAINS     RARE GRAM POSITIVE RODS     RARE GRAM NEGATIVE RODS   Report Status 09/06/2011 FINAL   Final   CULTURE, RESPIRATORY     Status: Normal   Collection Time   09/06/11 12:02 PM      Component Value Range Status Comment   Specimen Description SPUTUM   Final    Special Requests NONE   Final    Gram Stain     Final    Value: FEW WBC PRESENT,BOTH PMN AND MONONUCLEAR     ABUNDANT SQUAMOUS EPITHELIAL CELLS PRESENT     ABUNDANT GRAM POSITIVE COCCI IN PAIRS AND CHAINS     IN CLUSTERS  RARE GRAM POSITIVE RODS     RARE GRAM NEGATIVE RODS   Culture NORMAL OROPHARYNGEAL FLORA   Final    Report Status 09/08/2011 FINAL   Final   URINE CULTURE     Status: Normal   Collection Time   09/06/11  2:03 PM      Component Value Range Status Comment   Specimen Description URINE, CLEAN CATCH   Final    Special Requests NONE   Final    Setup Time [id number]   Final    Colony Count NO GROWTH   Final    Culture NO GROWTH   Final    Report Status 09/07/2011 FINAL   Final   CULTURE, BLOOD (ROUTINE X 2)     Status: Normal (Preliminary result)   Collection Time   09/06/11  5:10 PM      Component Value Range Status Comment   Specimen Description BLOOD RIGHT ARM   Final    Special Requests BOTTLES DRAWN AEROBIC AND ANAEROBIC 10CC EACH   Final    Setup Time [id number]   Final    Culture     Final    Value:        BLOOD CULTURE RECEIVED NO GROWTH TO DATE CULTURE WILL BE HELD FOR 5 DAYS BEFORE ISSUING A FINAL NEGATIVE REPORT   Report Status PENDING   Incomplete   CULTURE, BLOOD (ROUTINE X 2)     Status: Normal (Preliminary result)   Collection Time   09/06/11  5:19 PM       Component Value Range Status Comment   Specimen Description BLOOD LEFT ARM   Final    Special Requests BOTTLES DRAWN AEROBIC AND ANAEROBIC 10CC EACH   Final    Setup Time [id number]   Final    Culture     Final    Value:        BLOOD CULTURE RECEIVED NO GROWTH TO DATE CULTURE WILL BE HELD FOR 5 DAYS BEFORE ISSUING A FINAL NEGATIVE REPORT   Report Status PENDING   Incomplete   MRSA PCR SCREENING     Status: Normal   Collection Time   09/06/11  5:33 PM      Component Value Range Status Comment   MRSA by PCR NEGATIVE  NEGATIVE  Final     Anti-infectives     Start     Dose/Rate Route Frequency Ordered Stop   09/07/11 1200   vancomycin (VANCOCIN) IVPB 1000 mg/200 mL premix  Status:  Discontinued        1,000 mg 200 mL/hr over 60 Minutes Intravenous Every 24 hours 09/07/11 1032 09/10/11 0910   09/07/11 0000   vancomycin (VANCOCIN) 750 mg in sodium chloride 0.9 % 150 mL IVPB  Status:  Discontinued        750 mg 150 mL/hr over 60 Minutes Intravenous Every 12 hours 09/06/11 1737 09/07/11 1032   09/06/11 1800   Levofloxacin (LEVAQUIN) IVPB 750 mg        750 mg 100 mL/hr over 90 Minutes Intravenous Every 48 hours 09/06/11 1737     09/06/11 1100   piperacillin-tazobactam (ZOSYN) IVPB 3.375 g        3.375 g 12.5 mL/hr over 240 Minutes Intravenous  Once 09/06/11 1056 09/06/11 1629   09/06/11 1100   vancomycin (VANCOCIN) IVPB 1000 mg/200 mL premix        1,000 mg 200 mL/hr over 60 Minutes Intravenous  Once 09/06/11 1056 09/06/11 1200   09/06/11 0030  piperacillin-tazobactam (ZOSYN) IVPB 3.375 g        3.375 g 12.5 mL/hr over 240 Minutes Intravenous Every 8 hours 09/06/11 1737            Assessment: Todd [name] is an 42 YOM male admitted for suspected HCAP. Chest Xray 11/5 showed patchy airspace disease bilaterally. Chest Xray today shows increasing airspace disease and he was intubated 11/7. Blood and urine cultures are NGTD, and respiratory culture grew normal oropharyngeal flora. Scr  creatinine is stable with Est CrCl 45-50 ml/min. Patient on levaquin 750 mg every other day day#4.  Goal of Therapy:  Levaquin 750 mg every other day until pneumonia resolved.  Plan:  Continue levaquin 750 mg every other day until stop date established.  Nelson Julson, [place] R 09/10/2011,3:15 PM  Plan has been discussed and verified, and I agree with the assessment. Peterson Mathey, [place] R 09/10/2011

## 2011-09-10 NOTE — Consult Note (Signed)
ANTICOAGULATION CONSULT NOTE - Initial Consult  Pharmacy Consult for heparin Indication: atrial fibrillation  No Known Allergies  Patient Measurements: Height: 6\' 1"  (185.4 cm) Weight: 165 lb 2 oz (74.9 kg) IBW/kg (Calculated) : 79.9   Vital Signs: Temp: 98.5 F (36.9 C) (11/09 0700) Temp src: Axillary (11/09 0700) BP: 118/62 mmHg (11/09 0930) Pulse Rate: 76  (11/09 0930)  Labs:  [facility] 09/10/11 0500 09/09/11 0400 09/08/11 1015 09/08/11 0345  HGB 8.1* 8.3* -- --  HCT 24.8* 25.6* 29.2* --  PLT 258 243 307 --  APTT -- -- -- --  LABPROT -- -- -- --  INR -- -- -- --  HEPARINUNFRC -- -- -- --  CREATININE 1.15 1.21 -- 1.26  CKTOTAL -- -- -- --  CKMB -- -- -- --  TROPONINI -- -- -- --   Estimated Creatinine Clearance: 49.8 ml/min (by C-G formula based on Cr of 1.15).  Medical History: Past Medical History  Diagnosis Date  . Asthma   . Hypertension   . Diabetes mellitus   . Cancer   . A-fib   . Hyperlipidemia   . Colon cancer   . Shortness of breath     Medications:  Scheduled:    . albuterol  2.5 mg Nebulization Q6H  . antiseptic oral rinse  15 mL Mouth Rinse QID  . aspirin  324 mg Oral Once  . aspirin  81 mg Per Tube Daily  . chlorhexidine  15 mL Mouth Rinse BID  . hydrocortisone sodium succinate  50 mg Intravenous Q6H  . influenza (>/= 3 years) inactive virus vaccine  0.5 mL Intramuscular Tomorrow-1000  . insulin aspart  0-9 Units Subcutaneous Q4H  . ipratropium  0.5 mg Nebulization Q6H  . levofloxacin (LEVAQUIN) IV  750 mg Intravenous Q48H  . pantoprazole (PROTONIX) IV  40 mg Intravenous Q24H  . piperacillin-tazobactam (ZOSYN)  IV  3.375 g Intravenous Q8H  . simvastatin  10 mg Oral q1800  . sodium chloride  10 mL Intravenous Q12H  . sodium chloride  3 mL Intravenous Q12H  . DISCONTD: aspirin EC  81 mg Oral Daily  . DISCONTD: vancomycin  1,000 mg Intravenous Q24H   Infusions:    . sodium chloride 50 mL (09/09/11 2347)  . norepinephrine (LEVOPHED)  Adult infusion Stopped (09/10/11 0900)    Assessment: Todd [name] is an 77YO male not currently anticoagulated to be started on full dose heparin for atrial fibrillation. Oral anticoagulation to be discussed with patient and family once the patient is extubated. Patient is slightly anemic, however this is thought to be dilutional. No bleeding noted. Platelets ok. Will give reduced heparin bolus ~35mg /kg due to advanced age and low hemoglobin.  Goal of Therapy:  Heparin level 0.3-0.7 units/ml   Plan:  Give heparin 2500 units IV bolus now Start heparin IV drip 1000 units/hr (10 ml/hr) Check 8 hour heparin level Start daily heparin level 09/10/14 at 0500  Todd [name], PharmD Candidate 09/10/2011,9:47 AM  Agree with assessment and plan per above, will proceed with orders and levels. Todd [name], Todd [name] 09/10/2011 1020

## 2011-09-10 NOTE — Progress Notes (Signed)
UR Completed.  Todd [name] 09/10/2011  

## 2011-09-10 NOTE — Progress Notes (Signed)
As above, patient seen and examined; patient intubated but alert; for extubation today as pneumonia and respiratory status improving; heart rate controlled. He has multiple embolic risk farctors including recent CVA; add IV heparin and follow. After extubation and family available, would review anticoagulation issue; I feel he will need coumadin long term. Will add meds for reduced LV function later as BP and pulse tolerate. Given age and overall medical condition, favor medical therapy for recent NSTEMI.    Olga Millers 09/10/2011 9:26 AM

## 2011-09-10 NOTE — Progress Notes (Signed)
Heparin protocol  Heparin level = 0.49 goal 0.3-0.7 afib No complication  Plan:  Cont heparin at 1000 units/hr F/u with heparin level in AM

## 2011-09-10 NOTE — Progress Notes (Signed)
INITIAL ADULT NUTRITION ASSESSMENT Date: 09/10/2011   Time: 3:20 PM Reason for Assessment: VDRF  ASSESSMENT: Male 77 y.o.  Dx: Pneumonia  Hx:  Past Medical History  Diagnosis Date  . Asthma   . Hypertension   . Diabetes mellitus   . Cancer   . A-fib   . Hyperlipidemia   . Colon cancer   . Shortness of breath     Related Meds:     . albuterol  2.5 mg Nebulization Q6H  . antiseptic oral rinse  15 mL Mouth Rinse QID  . aspirin  324 mg Oral Once  . aspirin  81 mg Per Tube Daily  . chlorhexidine  15 mL Mouth Rinse BID  . heparin  2,500 Units Intravenous Once  . hydrocortisone sodium succinate  50 mg Intravenous Q6H  . influenza (>/= 3 years) inactive virus vaccine  0.5 mL Intramuscular Tomorrow-1000  . insulin aspart  0-9 Units Subcutaneous Q4H  . ipratropium  0.5 mg Nebulization Q6H  . levofloxacin (LEVAQUIN) IV  750 mg Intravenous Q48H  . pantoprazole (PROTONIX) IV  40 mg Intravenous Q24H  . piperacillin-tazobactam (ZOSYN)  IV  3.375 g Intravenous Q8H  . simvastatin  10 mg Oral q1800  . sodium chloride  10 mL Intravenous Q12H  . DISCONTD: heparin  3,500 Units Intravenous Once  . DISCONTD: sodium chloride  3 mL Intravenous Q12H  . DISCONTD: vancomycin  1,000 mg Intravenous Q24H     Ht: 6\' 1"  (185.4 cm)  Wt: 165 lb 2 oz (74.9 kg)  Ideal Wt: 83.6 kg % Ideal Wt: 90%  Usual Wt: N/A % Usual Wt: N/A  Body mass index is 21.79 kg/(m^2).  Food/Nutrition Related Hx: unknown  Labs: Sodium 142 mEq/L      Potassium 3.5 mEq/L      Chloride 107 mEq/L      CO2 24 mEq/L      Glucose, Bld 131 mg/dL H     BUN 34 mg/dL H     Creatinine, Ser 1.61 mg/dL      Calcium 9.0 mg/dL      Total Protein 6.0 g/dL      Albumin 2.6 g/dL L     AST 19 U/L      ALT 15 U/L      Alkaline Phosphatase 79 U/L      Total Bilirubin 0.4 mg/dL   I/O last 3 completed shifts: In: 1767.7 [I.V.:1331.7; NG/GT:86; IV Piggyback:350] Out: 1075 [Urine:1075] Total I/O In: 465 [I.V.:415; IV Piggyback:50] Out:  226 [Urine:225; Stool:1]     Diet Order: NPO  Supplements/Tube Feeding:  None  IVF:    sodium chloride Last Rate: 50 mL (09/09/11 2347)  sodium chloride Last Rate: 20 mL (09/10/11 1128)  heparin Last Rate: 1,000 Units (09/10/11 1500)  norepinephrine (LEVOPHED) Adult infusion Last Rate: Stopped (09/10/11 0900)    Estimated Nutritional Needs:   Kcal: 1675 Protein: 90-110 grams Fluid: 2 liters  NUTRITION DIAGNOSIS: -Inadequate oral intake (NI-2.1).  Status: Ongoing  RELATED TO: inability to eat  AS EVIDENCE BY: NPO status  MONITORING/EVALUATION(Goals): Goal:  Initiate TF to meet nutrition needs if unable to extubate Monitor for TF initiation, tolerance versus diet advancement after extubation.  EDUCATION NEEDS: -No education needs identified at this time  INTERVENTION: For TF, recommend:  Jevity 1.2 at 25 ml/h, increase by 10 ml every 4 hours to 55 ml/h with prostat 30 ml BID to provide 1728 kcals, 103 grams protein, 1069 ml free water daily.  Dietitian [phone]  DOCUMENTATION CODES Per approved criteria  -Not Applicable    Todd [name] 09/10/2011, 3:20 PM

## 2011-09-10 NOTE — Progress Notes (Signed)
Extubated to 4L [place] pt is stable RT will continue to monitor.

## 2011-09-11 ENCOUNTER — Inpatient Hospital Stay (HOSPITAL_COMMUNITY): Payer: Medicare Other

## 2011-09-11 LAB — GLUCOSE, CAPILLARY
Glucose-Capillary: 108 mg/dL — ABNORMAL HIGH (ref 70–99)
Glucose-Capillary: 119 mg/dL — ABNORMAL HIGH (ref 70–99)
Glucose-Capillary: 130 mg/dL — ABNORMAL HIGH (ref 70–99)
Glucose-Capillary: 136 mg/dL — ABNORMAL HIGH (ref 70–99)
Glucose-Capillary: 156 mg/dL — ABNORMAL HIGH (ref 70–99)
Glucose-Capillary: 183 mg/dL — ABNORMAL HIGH (ref 70–99)

## 2011-09-11 LAB — DIFFERENTIAL
Basophils Absolute: 0 10*3/uL (ref 0.0–0.1)
Eosinophils Relative: 0 % (ref 0–5)
Lymphocytes Relative: 5 % — ABNORMAL LOW (ref 12–46)
Monocytes Absolute: 0.6 10*3/uL (ref 0.1–1.0)
Monocytes Relative: 7 % (ref 3–12)
Neutro Abs: 7.8 10*3/uL — ABNORMAL HIGH (ref 1.7–7.7)

## 2011-09-11 LAB — CBC
HCT: 26.5 % — ABNORMAL LOW (ref 39.0–52.0)
Hemoglobin: 8.7 g/dL — ABNORMAL LOW (ref 13.0–17.0)
MCHC: 32.8 g/dL (ref 30.0–36.0)
MCV: 82.6 fL (ref 78.0–100.0)
RDW: 18.4 % — ABNORMAL HIGH (ref 11.5–15.5)
WBC: 8.9 10*3/uL (ref 4.0–10.5)

## 2011-09-11 LAB — BASIC METABOLIC PANEL
BUN: 33 mg/dL — ABNORMAL HIGH (ref 6–23)
CO2: 22 mEq/L (ref 19–32)
Chloride: 107 mEq/L (ref 96–112)
Creatinine, Ser: 1.07 mg/dL (ref 0.50–1.35)
GFR calc Af Amer: 71 mL/min — ABNORMAL LOW (ref >90)
Potassium: 3.7 mEq/L (ref 3.5–5.1)

## 2011-09-11 MED ORDER — ALBUTEROL SULFATE (5 MG/ML) 0.5% IN NEBU
2.5000 mg | INHALATION_SOLUTION | RESPIRATORY_TRACT | Status: DC | PRN
  Administered 2011-09-11: 2.5 mg via RESPIRATORY_TRACT
  Filled 2011-09-11: qty 0.5

## 2011-09-11 MED ORDER — FUROSEMIDE 10 MG/ML IJ SOLN
40.0000 mg | Freq: Once | INTRAMUSCULAR | Status: AC
  Administered 2011-09-11: 40 mg via INTRAVENOUS
  Filled 2011-09-11: qty 4

## 2011-09-11 MED ORDER — STARCH (THICKENING) PO POWD
ORAL | Status: DC | PRN
  Filled 2011-09-11 (×2): qty 227

## 2011-09-11 MED ORDER — POTASSIUM CHLORIDE 20 MEQ/15ML (10%) PO LIQD
40.0000 meq | Freq: Once | ORAL | Status: AC
  Administered 2011-09-11: 40 meq
  Filled 2011-09-11: qty 30

## 2011-09-11 MED ORDER — ASPIRIN EC 81 MG PO TBEC
81.0000 mg | DELAYED_RELEASE_TABLET | Freq: Every day | ORAL | Status: DC
  Administered 2011-09-11 – 2011-09-20 (×10): 81 mg via ORAL
  Filled 2011-09-11 (×10): qty 1

## 2011-09-11 NOTE — Progress Notes (Signed)
ANTICOAGULATION CONSULT NOTE - Follow Up Consult  Pharmacy Consult for Heparin Indication: atrial fibrillation  No Known Allergies  Patient Measurements: Height: 6\' 1"  (185.4 cm) Weight: 164 lb 7.4 oz (74.6 kg) IBW/kg (Calculated) : 79.9    Vital Signs: Temp: 98.2 F (36.8 C) (11/10 1200) Temp src: Oral (11/10 1200) BP: 132/62 mmHg (11/10 1200) Pulse Rate: 112  (11/10 1200)  Labs:  Basename 09/11/11 0545 09/10/11 2128 09/10/11 0500 09/09/11 0400  HGB 8.7* -- 8.1* --  HCT 26.5* -- 24.8* 25.6*  PLT 293 -- 258 243  APTT -- -- -- --  LABPROT -- -- -- --  INR -- -- -- --  HEPARINUNFRC 0.39 0.49 -- --  CREATININE 1.07 -- 1.15 1.21  CKTOTAL -- -- -- --  CKMB -- -- -- --  TROPONINI -- -- -- --   Estimated Creatinine Clearance: 53.3 ml/min (by C-G formula based on Cr of 1.07). }  Assessment: A-Fib with CVR previously on Diltiazem stopped on 11/8 d/t low SBP and HR 60 may need to restart now HR 85-112 and SBP 130 Heaprin level at goal 0.39 drip rate 1000 uts/hr.  No beeding noted, cbc stable Goal of Therapy:  Heparin level 0.3-0.7 units/ml   Plan:  Continue Heparin drip 1000 uts/hr chaeck AM HL/CBC  Marcelino Scot 09/11/2011,12:53 PM

## 2011-09-11 NOTE — Progress Notes (Signed)
Hospital Admission Note Date: 09/09/11  Patient name: Todd [name]           Medical record number: [id number] Date of birth: Nov 27, 1923        Age: 77 y.o.     Gender: male PCP: No primary provider on file.   Medical Service: PCCM,MD  Brief Summary: 77 yo male, PMH AF on Pradaxa, COPD/Asthma, PNA, Bronchiectasis (Dr Craige Cotta, 2009), CVA 8/12 (D/C from SNF 8/12 for same). Admitted by [facility] to [place] 09/06/11 for HCAP, Sepsis, NSTEMI (cards), Gram + cocci in sputum. Improved on 11/6. 11/7 increased SOB/WOB, worsening CXR. PCCM requested by cards for further management. Intubated, likley asp pNA  Lines/Tubes: 11/6 rt Ij >>> 11/6 ETT>>>11/9  MICROBIOLOGY/sepsis markers 11/5 BCX2: negative>>> 11/5: sputum: Normal flora 11/5: UC: negative 11/5: MRSA: neg  Anti-infectives    11/5 vanc>>>11/9 11/5 levo>>>11/10 11/6 zosyn>>>     Best Practice/Protocols:   pradaxa, Xarelto PPI  Consults: Treatment Team:    R[facility] Pricilla Riffle, M   Studies:  Key Events:  09/08/11 increased SOB/WOB, RR 40s. Move to 2900.  11/8- intubated, sepsis, DNR established    11/9- improved, weaning, low pressor needs   Physical Exam: Temp:  [97.3 F (36.3 C)-98.5 F (36.9 C)] 98.2 F (36.8 C) (11/10 1200) Pulse Rate:  [40-112] 89  (11/10 1500) Resp:  [17-25] 24  (11/10 1500) BP: (111-140)/(49-75) 133/71 mmHg (11/10 1500) SpO2:  [93 %-100 %] 94 % (11/10 1500) Weight:  [74.6 kg (164 lb 7.4 oz)] 164 lb 7.4 oz (74.6 kg) (11/10 0300)  Gen: awake and in no distress.  ENT: Normal cephalic.  Neck:  wnl JVD, No JVD Right IJ unremarkable.  Lungs: Right > left crackles. No accessory muscle use.  Cardiovascular: IRR, S1 S2 no m Abdomen: soft and NT, no HSM,  BS normal Musculoskeletal: No deformities, no cyanosis or clubbing Neuro: rass 0, alert and oriented.  Skin: Warm, no lesions or rashes  Labs: Lab Results  Component Value Date   CREATININE 1.07 09/11/2011   BUN 33* 09/11/2011   NA  143 09/11/2011   K 3.7 09/11/2011   CL 107 09/11/2011   CO2 22 09/11/2011   Lab Results  Component Value Date   WBC 8.9 09/11/2011   HGB 8.7* 09/11/2011   HCT 26.5* 09/11/2011   MCV 82.6 09/11/2011   PLT 293 09/11/2011    PCXR Right sided airspace disease w/ effusion. No significant change.   Assessment & Plan  1) Acute respiratory failure in setting of HCAI/Sepsis. Now extubated and weaning FIO2.  Plan: - diurese as tolerated - cont pulm hygiene measures - f/u cxr - narrow abx to zosyn only. Will complete 8-10 d total   2)Acute adrenal insufficiency  Plan: -complete 7 day stress steroids 3) NSTEMI Plan: -Per cards, appreciate recs -Tele 4)Atrial fibrillation-chronic, on Pradaxa.  Lab 09/06/11 2345 09/06/11 1145  POCBNP 5432.0* 1546.0*  ]  Plan: -rate control and heparin gtt per cards.   5) debility Plan: -pt and ot consults.    STAFF NOTE:I, Dr Lavinia Sharps have personally reviewed patient's available data, including medical history, events of note, physical examination and test results as part of my evaluation. I have discussed with resident/NP and other care providers such as RN and RRT.  In addition I personally evaluated patient and elicited key findings of that he was extubated yesterday and appears to be stable from resp stand point but deconditioned and in need of reconditioning. Will continue to push  pulmonary toilet and on 09/02/11 ask [facility] to assume service depending on course. Rest per NP/medical resident whose note is outlined above and that I agree with

## 2011-09-11 NOTE — Progress Notes (Signed)
Patient c/o SOB after scheduled breathing treatment at 211 was given. Patient breath sounds rhonchi and diminished with some wheezing noted. Elink md called and order for prn treatment obtained to give now. Asked patient if he thought that suctioning would help and explained NTS to him. He states that he doesn't think that would help. Albuterol PRN neb given. Will continue to monitor.

## 2011-09-11 NOTE — Progress Notes (Signed)
Speech Language/Pathology Clinical/Bedside Swallow Evaluation Patient Details  Name: Todd [name] MRN: [id number] DOB: Jan 15, 1924 Today's Date: 09/11/2011  Past Medical History:  Past Medical History  Diagnosis Date  . Asthma   . Hypertension   . Diabetes mellitus   . Cancer   . A-fib   . Hyperlipidemia   . Colon cancer   . Shortness of breath    Past Surgical History:  Past Surgical History  Procedure Date  . Colon surgery     Partial hemecolectomy   . Central line insertion 09/08/2011         Assessment/Recommendations/Treatment Plan Suspected Esophageal Findings Suspected Esophageal Findings: Regurgitation;Other (comment) (with thin liquids. Reports of 'food sticking")  SLP Assessment Clinical Impression Statement: Moderate oropharygneal dysphagia with clinical outward s/s of aspiration s/p swallow of thin liquids. Pt. with suspected esophageal dysphagia as family reports patient "choking" on solids with subsequent regurgitation following current CVA. Risk for Aspiration: Moderate Other Related Risk Factors: History of pneumonia;History of GERD;Previous CVA;Decreased respiratory status  Recommendations Recommended Consults: MBS;Consider GI evaluation;Consider esophageal assessment Solid Consistency: Dysphagia 2 (Fine chop) Liquid Consistency: Nectar Liquid Administration via: Cup;No straw Medication Administration: Whole meds with liquid Supervision: Full supervision/cueing for compensatory strategies Compensations: Slow rate;Small sips/bites;Clear throat intermittently;Follow solids with liquid;Effortful swallow Postural Changes and/or Swallow Maneuvers: Seated upright 90 degrees;Upright 30-60 min after meal;Chin tuck Oral Care Recommendations: Oral care before and after PO Other Recommendations: Order thickener from pharmacy;Prohibited food (jello, ice cream, thin soups);Remove water pitcher  Prognosis Prognosis for Safe Diet Advancement: Good  Individuals  Consulted Consulted and Agree with Results and Recommendations: Patient;Family member/caregiver;RN  Swallow Study Goals  SLP Swallowing Goals Patient will consume recommended diet without observed clinical signs of aspiration with: Minimal assistance Swallow Study Goal #1 - Progress: Progressing toward goal Patient will utilize recommended strategies during swallow to increase swallowing safety with: Minimal assistance Swallow Study Goal #2 - Progress: Progressing toward goal  Swallow Study Prior Functional Status     General  Date of Onset: 09/06/11 Other Pertinent Information: Pt reports symptoms of 06/2011 CVA have resolved.  No swallowing difficulties at that time or since. Type of Study: Bedside swallow evaluation Diet Prior to this Study: NPO Temperature Spikes Noted: No Respiratory Status: Supplemental O2 delivered via (comment) Trach Size and Type: Other (Comment) (nasal cannula) History of Intubation: Yes Length of Intubations (days): 3 days Date extubated: 09/10/11 Behavior/Cognition: Alert;Cooperative;Pleasant mood Oral Cavity - Dentition:  (missing lower partial) Vision: Functional for self-feeding Patient Positioning: Upright in bed Baseline Vocal Quality: Hoarse;Low vocal intensity Volitional Cough: Strong Volitional Swallow: Able to elicit Ice chips: Tested (comment)  Oral Motor/Sensory Function  Labial ROM: Within Functional Limits Labial Symmetry: Within Functional Limits Labial Strength: Within Functional Limits Labial Sensation: Within Functional Limits Lingual ROM: Within Functional Limits Lingual Symmetry: Within Functional Limits Lingual Strength: Within Functional Limits Lingual Sensation: Within Functional Limits Facial ROM: Within Functional Limits Facial Symmetry: Within Functional Limits Facial Strength: Within Functional Limits Facial Sensation: Within Functional Limits Velum: Within Functional Limits Mandible: Within Functional  Limits  Consistency Results  Ice Chips Ice chips: Impaired Pharyngeal Phase Impairments: Cough - Delayed  Thin Liquid Thin Liquid: Impaired Presentation: Cup;Self Fed Pharyngeal  Phase Impairments: Delayed Swallow;Cough - Immediate;Decreased hyoid-laryngeal movement  Nectar Thick Liquid Nectar Thick Liquid: Impaired Pharyngeal Phase Impairments: Delayed Swallow;Decreased hyoid-laryngeal movement  Honey Thick Liquid Honey Thick Liquid: Not tested  Puree Puree: Impaired Pharyngeal Phase Impairments: Delayed Swallow;Decreased hyoid-laryngeal movement  Solid Solid: Impaired Pharyngeal Phase Impairments: Cough -  Delayed;Wet Vocal Quality  Moreen Fowler, M.S., CCC-SLP [facility] 09/11/2011,3:05 PM

## 2011-09-11 NOTE — Progress Notes (Signed)
[facility]   Subjective:  There were events overnight - specifically, pt was extubated yesterday successfully, with plans not to reintubated. However, overnight, he developed acute respiratory distress with shortness of breath and coughing fit, after receiving his scheduled Albuterol treatment. For this, he received repeat Albuterol treatment, suctioning, IV steroids, and morphine. Currently, the patient confirms symptoms of shortness of breath, cough. he denies symptoms of chest pain, chest pressure/discomfort and palpitations, chills, fevers.    Objective:  Vital Signs:   BP 125/65  Pulse 92  Temp(Src) 97.3 F (36.3 C) (Oral)  Resp 18  Ht 6\' 1"  (1.854 m)  Wt 164 lb 7.4 oz (74.6 kg)  BMI 21.70 kg/m2  SpO2 97%    Intake/Output:  11/09 0701 - 11/10 0700 In: 1515 [I.V.:1205; IV Piggyback:310] Out: 427 [Urine:425; Stool:2]   Physical Exam: General: Vital signs reviewed and noted. Well-developed, well-nourished, in no acute distress; alert, appropriate and cooperative throughout examination.  Lungs:  Normal respiratory effort. Coarse breath sounds throughout. Mild occasional wheezing.  Heart: Regular rate, irregular rhythm. S1 and S2 normal without gallop, murmur, or rubs.  Abdomen:  BS normoactive. Soft, Nondistended, non-tender.  No masses or organomegaly.  Extremities: No pretibial edema.     Labs: Basic Metabolic Panel: Lab 09/10/11 0500 09/09/11 0400 09/08/11 0345 09/06/11 1145  NA 142 137 138 --  K 3.5 3.6 3.8 --  CL 107 104 102 --  CO2 24 25 25  --  GLUCOSE 131* 106* 142* --  BUN 34* 36* 36* --  CREATININE 1.15 1.21 1.26 --  CALCIUM 9.0 9.1 9.8 --  MG -- -- -- 2.1  PHOS -- -- -- --    Liver Function Tests: Lab 09/10/11 0500 09/07/11 0425 09/06/11 1145  AST 19 24 17   ALT 15 11 9   ALKPHOS 79 78 86  BILITOT 0.4 0.2* 0.2*  PROT 6.0 7.0 7.1  ALBUMIN 2.6* 3.4* 3.5   CBC: Lab 09/10/11 0500 09/09/11 0400 09/08/11 1015 09/07/11 1139  WBC 8.2 10.8* 16.4* --    NEUTROABS 6.4 -- -- --  HGB 8.1* 8.3* 9.5* --  HCT 24.8* 25.6* 29.2* --  MCV 82.4 82.6 82.3 81.5  PLT 258 243 307 --    Cardiac Enzymes: Lab 09/07/11 0803 09/06/11 2345 09/06/11 1754 09/06/11 1145  CKTOTAL 276* 196 144 130  CKMB 13.8* 10.9* 8.7* 6.1*  CKMBINDEX -- -- -- --  TROPONINI 2.76* 1.16* 1.73* 1.09*    BNP: Lab 09/06/11 2345 09/06/11 1145  POCBNP 5432.0* 1546.0*    CBG: Lab 09/11/11 0313 09/11/11 0004 09/10/11 1920 09/10/11 1613 09/10/11 1143  GLUCAP 119* 108* 159* 139* 131*    Microbiology: CULTURE, BLOOD (ROUTINE X 2)     Status: Normal (Preliminary result)   Report Status PENDING   Incomplete   CULTURE, BLOOD (ROUTINE X 2)     Status: Normal (Preliminary result)   Report Status PENDING   Incomplete   GRAM STAIN     Status: Normal   Value: FEW WBC PRESENT,BOTH PMN AND MONONUCLEAR     ABUNDANT GRAM POSITIVE COCCI IN PAIRS IN CLUSTERS     ABUNDANT GRAM POSITIVE COCCI IN PAIRS IN CHAINS     RARE GRAM POSITIVE RODS     RARE GRAM NEGATIVE RODS  CULTURE, RESPIRATORY     Status: Normal   Value: FEW WBC PRESENT,BOTH PMN AND MONONUCLEAR     ABUNDANT SQUAMOUS EPITHELIAL CELLS PRESENT     ABUNDANT GRAM POSITIVE COCCI IN PAIRS AND CHAINS  IN CLUSTERS RARE GRAM POSITIVE RODS     RARE GRAM NEGATIVE RODS  URINE CULTURE     Status: Normal   Culture NO GROWTH   Final    Report Status 09/07/2011 FINAL   Final   CULTURE, BLOOD (ROUTINE X 2)     Status: Normal (Preliminary result)   Report Status PENDING   Incomplete   CULTURE, BLOOD (ROUTINE X 2)     Status: Normal (Preliminary result)   Report Status PENDING   Incomplete   MRSA PCR SCREENING     Status: Normal   MRSA by PCR NEGATIVE  NEGATIVE  Final     Imaging:  Dg Chest Portable 1 View (09/08/2011) Post-intubation - 1. Appropriately positioned support apparatus as above. No pneumothorax. 2. No change to minimally improved aeration of the right upper lung with persistent heterogeneous opacities worrisome for  infection.   Dg Chest Portable 1 View (09/08/2011) Pre-intubation - Worsening consolidation in the right upper lobe compatible with worsening pneumonia. Stable patchy left upper lobe opacity.  Dg Chest Portable 1 View (09/09/2011) - Increasing patchy bilateral airspace disease.   Dg Chest Portable 1 View (09/10/2011) - 1.  Improvement in aeration to the right upper lobe. 2.  Persistent low lung volumes and small effusions/edema.      Medications:   Infusions: . sodium chloride 50 mL/hr at 09/10/11 1900  . sodium chloride 20 mL (09/10/11 1128)  . heparin 1,000 Units (09/10/11 1900)    Scheduled Medications: . albuterol  2.5 mg Nebulization Q6H  . antiseptic oral rinse  15 mL Mouth Rinse BID  . aspirin  324 mg Oral Once  . aspirin  81 mg Per Tube Daily  . heparin  2,500 Units Intravenous Once  . hydrocortisone sodium succinate  50 mg Intravenous Q6H  . influenza (>/= 3 years) inactive virus vaccine  0.5 mL Intramuscular Tomorrow-1000  . insulin aspart  0-9 Units Subcutaneous Q4H  . ipratropium  0.5 mg Nebulization Q6H  . levofloxacin (LEVAQUIN) IV  750 mg Intravenous Q48H  . pantoprazole (PROTONIX) IV  40 mg Intravenous Q24H  . piperacillin-tazobactam (ZOSYN)  IV  3.375 g Intravenous Q8H  . simvastatin  10 mg Oral q1800  . sodium chloride  10 mL Intravenous Q12H    PRN Medications: acetaminophen, acetaminophen, albuterol, fentaNYL, midazolam, morphine injection, ondansetron (ZOFRAN) IV, sodium chloride    Assessment/ Plan:  Pt is a 77 y.o. yo male with a PMHx of non-oxygen dependent COPD, Atrial fibrillation (not on anticoagulation secondary to fall risk), recent stroke in August 2012 with discharge to SNF who was admitted on 09/06/2011 with symptoms of shortness of breath and cough x 1 week, which was determined to be secondary to HCAP, for which he is currently on triple antibiotic therapy. Despite antibiotic treatment, continued to be tachypneic into 40s, requiring intubation on  09/08/11 for airway protection and management, with extubation on 09/10/2011. Cardiology was consulted at admission secondary to elevated troponins of 1 that have been uptrending to maximum of 2.6, thought to be due to demand ischemia in setting of HCAP and respiratory failure.    Chest pain - secondary to demand ischemia in setting of acute HCAP/ asp PNA and respiratory failure. Remains without chest pain today. Therefore, interventions will be focused on treating underlying infection, continuing with medical therapy at this time.   Continue aspirin, statin, medical management.   Continue treat his acute pulmonary infection per primary team.   Atrial fibrillation - current rate controlled on  Diltiazem. CHADS2 score of 6, therefore, he should be anticoagulated, especially given recent embolic stroke (secondary to atrial fibrillation). However, is not a great coumadin candidate secondary to risk of falls. Pending palliative care consult for goals of care discussion regarding anticoagulation.   Continue Diltiazem.   Started heparin gtt on 09/10/11 with plan to start anticoagulation, likely with coumadin after discussion with pt and family.    Ejection fraction 20-25% (2D Echo 09/08/11) - never cath'ed. Currently not on a BB secondary to hypotension and acute pulmonary compromise. Home ARB on hold secondary to hypotension.   Monitor daily weight, strict I/O.   Respiratory failure, secondary to HCAP/ asp PNA: per CCM and primary team. Intubated on 09/08/2011, extubated 09/10/2011. Initially, triple antibiotics with Levaquin, Zosyn, Vanc (dc'ed 11/09). Now, Levaquin, Zosyn.     This patient's case and plan of care was discussed with attending, Valera Castle, M.D.    Johnette Abraham, D.OGlenna Durand, Internal Medicine Resident 09/11/2011, 6:20 AM   I have taken a history, reviewed medications, allergies, PMH, SH, FH, and reviewed ROS and examined the patient.  I agree with the assessment  and plan with the addition of Lasix 40mg  IV this am. Decrease IVF to maintenance. Check BMET in am. Maisie Fus C. Daleen Squibb, MD, [facility] Pager:  [phone]

## 2011-09-11 NOTE — Progress Notes (Signed)
eLink Physician-Brief Progress Note Patient Name: Todd [name] DOB: 10/11/1924 MRN: [id number]  Date of Service  09/11/2011   HPI/Events of Note   Resp distress with wheezing  eICU Interventions  Patient extubated yesterday with DNR and no plan to re-intubate.  Received neb treatment this am.  Now with additional coughing and some wheezes.  Sats are fine and in WOB is fine. Plan: Prn order for albuterol      [name] 09/11/2011, 2:32 AM

## 2011-09-12 ENCOUNTER — Inpatient Hospital Stay (HOSPITAL_COMMUNITY): Payer: Medicare Other

## 2011-09-12 DIAGNOSIS — J690 Pneumonitis due to inhalation of food and vomit: Secondary | ICD-10-CM

## 2011-09-12 DIAGNOSIS — J9600 Acute respiratory failure, unspecified whether with hypoxia or hypercapnia: Secondary | ICD-10-CM

## 2011-09-12 DIAGNOSIS — A419 Sepsis, unspecified organism: Secondary | ICD-10-CM

## 2011-09-12 LAB — CULTURE, BLOOD (ROUTINE X 2)
Culture  Setup Time: 201211052249
Culture  Setup Time: 201211052339

## 2011-09-12 LAB — BASIC METABOLIC PANEL
Calcium: 9.4 mg/dL (ref 8.4–10.5)
Creatinine, Ser: 1.12 mg/dL (ref 0.50–1.35)
GFR calc non Af Amer: 58 mL/min — ABNORMAL LOW (ref >90)
Sodium: 146 mEq/L — ABNORMAL HIGH (ref 135–145)

## 2011-09-12 LAB — GLUCOSE, CAPILLARY
Glucose-Capillary: 135 mg/dL — ABNORMAL HIGH (ref 70–99)
Glucose-Capillary: 128 mg/dL — ABNORMAL HIGH (ref 70–99)

## 2011-09-12 LAB — CBC
MCH: 27 pg (ref 26.0–34.0)
MCHC: 33.1 g/dL (ref 30.0–36.0)
MCV: 81.5 fL (ref 78.0–100.0)
Platelets: 317 10*3/uL (ref 150–400)

## 2011-09-12 LAB — HEPARIN LEVEL (UNFRACTIONATED): Heparin Unfractionated: 0.29 IU/mL — ABNORMAL LOW (ref 0.30–0.70)

## 2011-09-12 MED ORDER — METOPROLOL TARTRATE 25 MG PO TABS
25.0000 mg | ORAL_TABLET | Freq: Two times a day (BID) | ORAL | Status: DC
  Administered 2011-09-12 (×2): 25 mg via ORAL
  Filled 2011-09-12 (×4): qty 1

## 2011-09-12 MED ORDER — HEPARIN (PORCINE) IN NACL 100-0.45 UNIT/ML-% IJ SOLN
1400.0000 [IU]/h | INTRAMUSCULAR | Status: DC
  Administered 2011-09-12: 1200 [IU]/h via INTRAVENOUS
  Administered 2011-09-13 – 2011-09-14 (×2): 1400 [IU]/h via INTRAVENOUS
  Filled 2011-09-12 (×5): qty 250

## 2011-09-12 MED ORDER — FUROSEMIDE 10 MG/ML IJ SOLN
40.0000 mg | Freq: Once | INTRAMUSCULAR | Status: AC
  Administered 2011-09-12: 40 mg via INTRAVENOUS
  Filled 2011-09-12: qty 4

## 2011-09-12 MED ORDER — POTASSIUM CHLORIDE CRYS ER 20 MEQ PO TBCR
40.0000 meq | EXTENDED_RELEASE_TABLET | Freq: Three times a day (TID) | ORAL | Status: DC
  Administered 2011-09-12 – 2011-09-15 (×10): 40 meq via ORAL
  Filled 2011-09-12 (×11): qty 2

## 2011-09-12 MED ORDER — PREDNISONE 20 MG PO TABS
40.0000 mg | ORAL_TABLET | Freq: Every day | ORAL | Status: DC
  Administered 2011-09-12 – 2011-09-15 (×4): 40 mg via ORAL
  Filled 2011-09-12 (×5): qty 2

## 2011-09-12 MED ORDER — POTASSIUM CHLORIDE CRYS ER 20 MEQ PO TBCR
60.0000 meq | EXTENDED_RELEASE_TABLET | Freq: Once | ORAL | Status: DC
  Filled 2011-09-12: qty 3

## 2011-09-12 NOTE — Progress Notes (Signed)
ANTICOAGULATION CONSULT NOTE - Follow Up Consult  Pharmacy Consult for heparin Indication: atrial fibrillation  No Known Allergies  Patient Measurements: Height: 6\' 1"  (185.4 cm) Weight: 171 lb 15.3 oz (78 kg) IBW/kg (Calculated) : 79.9   Vital Signs: Temp: 97.3 F (36.3 C) (11/11 1600) Temp src: Axillary (11/11 1600) BP: 122/60 mmHg (11/11 1600) Pulse Rate: 78  (11/11 1600)  Labs:  Basename 09/12/11 1500 09/12/11 0400 09/11/11 0545 09/10/11 0500  HGB -- 8.6* 8.7* --  HCT -- 26.0* 26.5* 24.8*  PLT -- 317 293 258  APTT -- -- -- --  LABPROT -- -- -- --  INR -- -- -- --  HEPARINUNFRC 0.29* 0.21* 0.39 --  CREATININE -- 1.12 1.07 1.15  CKTOTAL -- -- -- --  CKMB -- -- -- --  TROPONINI -- -- -- --   Estimated Creatinine Clearance: 53.2 ml/min (by C-G formula based on Cr of 1.12).   Medications:  Infusions:    . heparin 1,200 Units/hr (09/12/11 0941)  . DISCONTD: sodium chloride 50 mL/hr at 09/10/11 1900  . DISCONTD: sodium chloride 20 mL (09/10/11 1128)  . DISCONTD: heparin 1,000 Units/hr (09/11/11 0813)    Assessment: Pt on heparin for afib. Heparin level is very slightly subtherapeutic. Will empirically increase rate and f/u in AM.   Goal of Therapy:  Heparin level 0.3-0.7 units/ml   Plan:  Increase heparin gtt to 1250units/hr F/u AM heparin level  Gwenlyn Hottinger, Drake Leach 09/12/2011,5:43 PM

## 2011-09-12 NOTE — Plan of Care (Signed)
Problem: Phase II Progression Outcomes Goal: Tolerating diet Outcome: Progressing Tolerating Honey-thick liquids and dysphagia II diet w/o diff .

## 2011-09-12 NOTE — Progress Notes (Signed)
ANTICOAGULATION CONSULT NOTE - Follow Up Consult  Pharmacy Consult for Heparin Indication: atrial fibrillation  No Known Allergies  Patient Measurements: Height: 6\' 1"  (185.4 cm) Weight: 164 lb 7.4 oz (74.6 kg) IBW/kg (Calculated) : 79.9    Vital Signs: Temp: 97.7 F (36.5 C) (11/11 0400) Temp src: Oral (11/11 0400) BP: 135/73 mmHg (11/11 0600) Pulse Rate: 86  (11/11 0600)  Labs:  Basename 09/12/11 0400 09/11/11 0545 09/10/11 2128 09/10/11 0500  HGB 8.6* 8.7* -- --  HCT 26.0* 26.5* -- 24.8*  PLT 317 293 -- 258  APTT -- -- -- --  LABPROT -- -- -- --  INR -- -- -- --  HEPARINUNFRC 0.21* 0.39 0.49 --  CREATININE 1.12 1.07 -- 1.15  CKTOTAL -- -- -- --  CKMB -- -- -- --  TROPONINI -- -- -- --   Estimated Creatinine Clearance: 50.9 ml/min (by C-G formula based on Cr of 1.12).   Assessment: 77yo male now subtherapeutic on heparin for Afib after somewhat stable on current rate though had been trending down.  Goal of Therapy:  Heparin level 0.3-0.7 units/ml   Plan:  Will increase heparin gtt to 1200 units/hr and check heparin level in 8h.  Colleen Can PharmD BCPS 09/12/2011,6:53 AM

## 2011-09-12 NOTE — Progress Notes (Signed)
ANTICOAGULATION CONSULT NOTE - Follow Up Consult  Pharmacy Consult for Heparin / Zosyn Day #6 Levo d/c 11/9 Indication: atrial fibrillation  HCAP   No Known Allergies  Patient Measurements: Height: 6\' 1"  (185.4 cm) Weight: 171 lb 15.3 oz (78 kg) IBW/kg (Calculated) : 79.9    Vital Signs: Temp: 97.9 F (36.6 C) (11/11 0800) Temp src: Oral (11/11 0800) BP: 127/54 mmHg (11/11 1000) Pulse Rate: 94  (11/11 1000)  Labs:  Basename 09/12/11 0400 09/11/11 0545 09/10/11 2128 09/10/11 0500  HGB 8.6* 8.7* -- --  HCT 26.0* 26.5* -- 24.8*  PLT 317 293 -- 258  APTT -- -- -- --  LABPROT -- -- -- --  INR -- -- -- --  HEPARINUNFRC 0.21* 0.39 0.49 --  CREATININE 1.12 1.07 -- 1.15  CKTOTAL -- -- -- --  CKMB -- -- -- --  TROPONINI -- -- -- --   Estimated Creatinine Clearance: 53.2 ml/min (by C-G formula based on Cr of 1.12). }  Assessment: A-Fib with CVR previously on Diltiazem stopped on 11/8 d/t low SBP and HR 60 may need to restart now HR 85-112 and SBP 130 - Cards saw pt today and started Metoprolol for BP and HR control - CAD/HF  Heaprin level at goal 0.21 drip rate 1000 uts/hr.  No beeding noted, cbc stable Goal of Therapy:  Heparin level 0.3-0.7 units/ml   Plan:  Increase Heparin drip 1200 uts/hr check AM HL/CBC  Marcelino Scot 09/12/2011,11:05 AM

## 2011-09-12 NOTE — Progress Notes (Signed)
E-Link Dr aware of serum potassium value of 2.9  And of H & H of 8.6 /28 Joylene Draft [place]

## 2011-09-12 NOTE — Progress Notes (Signed)
Hospital Admission Note Date: 09/09/11  Patient name: Todd [name]           Medical record number: [id number] Date of birth: [birth year]        Age: 77 y.o.     Gender: male PCP: No primary provider on file.   Medical Service: PCCM,MD  Brief Summary: 77 yo male, PMH AF on Pradaxa, COPD/Asthma, PNA, Bronchiectasis (Dr Craige Cotta, 2009), CVA 8/12 (D/C from SNF 8/12 for same). Admitted by [facility] [place] 09/06/11 for HCAP, Sepsis, NSTEMI (cards), Gram + cocci in sputum. Improved on 11/6. 11/7 increased SOB/WOB, worsening CXR. PCCM requested by cards for further management. Intubated, likley asp pNA  Lines/Tubes: 11/6 rt Ij >>> 11/6 ETT>>>11/9  MICROBIOLOGY/sepsis markers 11/5 BCX2: negative>>> 11/5: sputum: Normal flora 11/5: UC: negative 11/5: MRSA: neg  Anti-infectives    11/5 vanc>>>11/9 11/5 levo>>>11/10 11/6 zosyn>>>     Best Practice/Protocols:   pradaxa, Xarelto PPI  Consults: Treatment Team:    Rounding Lbcardiology Pricilla Riffle, M   Studies:  Key Events:  09/08/11 increased SOB/WOB, RR 40s. Move to 2900.  11/8- intubated, sepsis, DNR established    11/9- improved, weaning, low pressor needs  Subjective Still get short of breath with activity, desaturates on supplemental oxygen.   Objective Physical Exam: Temp:  [96.7 F (35.9 C)-98.3 F (36.8 C)] 97.9 F (36.6 C) (11/11 0800) Pulse Rate:  [40-126] 94  (11/11 1000) Resp:  [20-29] 24  (11/11 1000) BP: (119-140)/(52-91) 127/54 mmHg (11/11 1000) SpO2:  [90 %-100 %] 95 % (11/11 1000) Weight:  [78 kg (171 lb 15.3 oz)] 171 lb 15.3 oz (78 kg) (11/11 0700)  Gen: awake and in no distress.  ENT: Normal cephalic.  Neck:  wnl JVD, No JVD Right IJ unremarkable.  Lungs: Right > left crackles. No accessory muscle use.  Cardiovascular: IRR, S1 S2 no m Abdomen: soft and NT, no HSM,  BS normal Musculoskeletal: No deformities, no cyanosis or clubbing Neuro: rass 0, alert and oriented.  Skin: Warm, no lesions or  rashes  Labs: Lab Results  Component Value Date   CREATININE 1.12 09/12/2011   BUN 34* 09/12/2011   NA 146* 09/12/2011   K 2.9* 09/12/2011   CL 109 09/12/2011   CO2 25 09/12/2011   Lab Results  Component Value Date   WBC 8.7 09/12/2011   HGB 8.6* 09/12/2011   HCT 26.0* 09/12/2011   MCV 81.5 09/12/2011   PLT 317 09/12/2011    PCXR Right sided airspace disease w/ effusion. Improved aeration.  Assessment & Plan  1) Acute respiratory failure in setting of HCAI/Sepsis. Now extubated and weaning FIO2. Medically he continues to improve Plan: - diurese as tolerated - cont pulm hygiene measures - f/u cxr - narrow abx to zosyn only. Will complete 8-10 d total   2)Acute adrenal insufficiency  Plan: -complete 7 day stress steroids  3) NSTEMI Plan: -Per cards, appreciate recs -Tele  4)Atrial fibrillation-chronic, on Pradaxa.  Lab 09/06/11 2345 09/06/11 1145  POCBNP 5432.0* 1546.0*  ] Plan: -rate control and heparin gtt per cards.   5) debility Plan: -pt and ot consults.   6) hypokalemia  Lab 09/12/11 0400 09/11/11 0545 09/10/11 0500  K 2.9* 3.7 3.5  ] Plan -replace and recheck   STAFF NOTE: I, Dr Lavinia Sharps have personally reviewed patient's available data, including medical history, events of note, physical examination and test results as part of my evaluation. I have discussed with NP.  In addition I personally evaluated  patient and elicited key findings of the fact that he is currently debilitated and is in  Need of aggressive PT/OT. [facility] will assume service 09/13/11. Rest per NP/medical resident whose note is outlined above and that I agree with

## 2011-09-12 NOTE — Progress Notes (Signed)
Chair position in bed

## 2011-09-12 NOTE — Plan of Care (Signed)
Problem: Phase II Progression Outcomes Goal: Dyspnea controlled w/progressive activity Outcome: Not Progressing DOE noted w/  Mobility in bed . Goal: ADLs completed with minimal assistance Outcome: Not Progressing Pt very enthusiastic re being self-care ,yet ,DOE is a barrier. Goal: Activity at appropriate level-compared to baseline (UP IN CHAIR FOR HEMODIALYSIS)  Outcome: Not Progressing Will re-evaluate day shift today . Goal: Tolerating diet Outcome: Progressing Tolerating honey-thick liquids w/o diff .

## 2011-09-12 NOTE — Progress Notes (Signed)
eLink Physician-Brief Progress Note Patient Name: Todd [name] DOB: 03/12/1924 MRN: [id number]  Date of Service  09/12/2011   HPI/Events of Note   Hypokalemia  eICU Interventions  Potassium replaced      Dyamon Sosinski 09/12/2011, 7:26 AM

## 2011-09-12 NOTE — Progress Notes (Signed)
Agree with dc plan to [facility]

## 2011-09-12 NOTE — Progress Notes (Signed)
Subjective:  He reports feeling better-states that his SOB is much better than yesterday. Denies any chest pain.? cough  Objective:  Vital Signs in the last 24 hours: Temp:  [96.7 F (35.9 C)-98.3 F (36.8 C)] 97.9 F (36.6 C) (11/11 0800) Pulse Rate:  [40-112] 88  (11/11 0700) Resp:  [21-29] 24  (11/11 0700) BP: (119-140)/(53-91) 121/91 mmHg (11/11 0700) SpO2:  [92 %-100 %] 95 % (11/11 0824) Weight:  [171 lb 15.3 oz (78 kg)] 171 lb 15.3 oz (78 kg) (11/11 0700)  Intake/Output from previous day: 11/10 0701 - 11/11 0700 In: 1400 [P.O.:420; I.V.:830; IV Piggyback:150] Out: 2600 [Urine:2600]  Physical Exam: Pt is alert and oriented, NAD HEENT: normal Neck: JVP - normal, carotids 2+= without bruits Lungs: good air movement bilaterally, rhonchi heard at left base, mild bibasilar rales. CV: RRR without murmur or gallop Abd: soft, NT, Positive BS, no hepatomegaly Ext: trace pedal edema, distal pulses intact and equal Skin: warm/dry no rash   Lab Results:  Basename 09/12/11 0400 09/11/11 0545  WBC 8.7 8.9  HGB 8.6* 8.7*  PLT 317 293    Basename 09/12/11 0400 09/11/11 0545  NA 146* 143  K 2.9* 3.7  CL 109 107  CO2 25 22  GLUCOSE 189* 130*  BUN 34* 33*  CREATININE 1.12 1.07   No results found for this basename: TROPONINI:2,CK,MB:2 in the last 72 hours Hepatic Function Panel  Basename 09/10/11 0500  PROT 6.0  ALBUMIN 2.6*  AST 19  ALT 15  ALKPHOS 79  BILITOT 0.4  BILIDIR --  IBILI --   No results found for this basename: CHOL in the last 72 hours No results found for this basename: PROTIME in the last 72 hours  Imaging: CXR 09/11/11: 1. No change to minimal increase in left basilar consolidative  opacities with findings of partial atelectasis/collapse of the  bilateral lower lungs.  2. Unchanged heterogeneous opacities within the right upper lung,  possibly infectious in etiology, though asymmetric pulmonary edema  may have a similar appearance.   Cardiac  Studies: No new images.  Assessment/Plan:  Pt is a 77 y.o. yo male with a PMHx of non-oxygen dependent COPD, Atrial fibrillation (not on anticoagulation secondary to fall risk), recent stroke in August 2012 with discharge to SNF who was admitted on 09/06/2011 with symptoms of shortness of breath and cough x 1 week, which was determined to be secondary to HCAP, for which he is currently on triple antibiotic therapy. Despite antibiotic treatment, continued to be tachypneic into 40s, requiring intubation on 09/08/11 for airway protection and management, with extubation on 09/10/2011. Cardiology was consulted at admission secondary to elevated troponins of 1 that have been uptrending to maximum of 2.6, thought to be due to demand ischemia in setting of HCAP and respiratory failure.   1. Acute respiratory failure with hypoxia (09/06/2011):  In the setting of HCAP and probable congestive heat failure. S/p extubation on 11/9. He had an episode of worsening SOB post extubation yesterday that was thought to be likely secondary to fluid overload.His SOB is getting better. On exam, he is noted to have some rales and trace pedal edema.    Plan:  Diurese with IV lasix              Continue antibiotics and steroids per CCM.   2. Chest pain (09/06/2011): denies any chest pain today. His elevated troponin's were likely secondary to demand ischemia from his pneumonia and intercurrent illness.      Plan:  Continue medical management with aspirin, statin , BB blocker.     3. Atrial fibrillation (09/06/2011): His HR increased to 130's with minimal activity when he was using bed- side commode.      Plan: Start him on low -dose BB.            Continue heparin for now. After discussing with Dr. Daleen Squibb, we think that he is not a great coumadin candidate given his risk of falls.           Will discuss with patient and family today. 4. Hypokalemia; secondary to lasix.     Plan:  Will replete 5. Ejection fraction 20-25% (2D  Echo 09/08/11) - never cath'ed. Currently not on a BB secondary to hypotension and acute pulmonary compromise. Home ARB on hold secondary to hypotension.  Monitor daily weight, strict I/O      SAWHNEY,MEGHA, M.D. 09/12/2011, 8:54 AM    I have taken a history, reviewed medications, allergies, PMH, SH, FH, and reviewed ROS and examined the patient.  I agree with A and P.  Rosemond Lyttle C. Daleen Squibb, MD, [facility] Pager:  [phone]

## 2011-09-12 NOTE — Progress Notes (Signed)
Physician Discharge Summary  Patient ID: Todd [name] MRN: [id number] DOB/AGE: [birth year] 76 y.o.  Admit date: 09/06/2011 Discharge date: 09/12/2011  Admission Diagnoses: Pneumonia  Diagnoses at time of transfer:  Principal Problem:  *Pneumonia Active Problems:  Sepsis (resolved)  Chest pain  Atrial fibrillation  Acute respiratory failure with hypoxia (resolved) Adrenal insuffiencey (improved) COPD Bronchiectasis   Patient Details:    Todd [name] is an 77 y.o. male.  Brief Summary: 77 yo male, PMH AF on Pradaxa, COPD/Asthma, PNA, Bronchiectasis (Dr Craige Cotta, 2009), CVA 8/12 (D/C from SNF 8/12 for same). Ad[facility] 09/06/11 for HCAP, Sepsis, NSTEMI (cards), Gram + cocci in sputum. Improved on 11/6. 11/7 increased SOB/WOB, worsening CXR. PCCM requested by cards for further management. Intubated, likley asp pNA   Lines/Tubes:  11/6 rt Ij >>>  11/6 ETT>>>11/9   MICROBIOLOGY/sepsis markers  11/5 BCX2: negative>>>  11/5: sputum: Normal flora  11/5: UC: negative  11/5: MRSA: neg   Anti-infectives  11/5 vanc>>>11/9  11/5 levo>>>11/10  11/6 zosyn>>>   Best Practice/Protocols:  pradaxa, Xarelto  PPI   Consults:  Treatment Team:  Rounding Lbcardiology  Pricilla Riffle, M   Studies:   Key Events:  09/08/11 increased SOB/WOB, RR 40s. Move to 2900.  11/8- intubated, sepsis, DNR established  11/9- improved, weaning, low pressor needs   Subjective  Still get short of breath with activity, desaturates on supplemental oxygen.   Hospital Course:  Principal Problem: 1) Acute respiratory failure with hypoxia in setting Pneumonia (Presumed aspiration). 77 yo male, With past medical history of atrial fibrillation, chronic obstructive pulmonary disease, and bronchiectasis. He is followed by Dr. Craige Cotta in the outpatient setting. He was admitted by internal medicine on 09-06-2011 to the step down unit from skilled nursing facility with dyspnea, productive cough, and  gram-positive cocci in his sputum. On 11-6 he developed increased shortness of breath, worsening aeration on chest film, and acute respiratory failure. He was intubated, placed on the mechanical ventilator, and broad-spectrum antibiotics were continued. Further diagnostic evaluation was also consistent with what appeared to be non-ST elevation MI. Resulting in pulmonary edema superimposed on pneumonia. He was treated initially with sedation, mechanical ventilation, and broad-spectrum and empiric antibiotics. He was successfully weaned and later extubated on 09-10-2011. From this point on his antibiotics have been narrowed, and his nose organisms have been identified. From a pulmonary standpoint the plan at this time will be to continue in 8-10 day total course of antibiotic therapy. Would continue supplemental oxygen and wean as tolerated, continue bronchodilators, and continue aggressive diuresis as BUN creatinine and blood pressure allow.  2) resolved sepsis: This was treated in the usual fashion with IV antibiotics,  goal-directed therapy,and critical care support. This is now resolved. Antibiotics have been narrowed as mentioned above.  3) NSTEMI: There's likely complicated his acute respiratory failure. He's now been hemodynamically stable. He is on aspirin, and beta blockade. Cardiology is following. He is not a cardiac catheterization candidate. Further treatment will be for medical therapy.  4)  Atrial fibrillation: currently regular rate. Controlled ventricular response, maintained on beta-blockade, also currently on heparin drip. Also on rivaroxiban.   5) adrenal insufficiency: this was treated with stress dose steroids. Will go ahead and transition him to oral prednisone, and would taper over 2 week period.   Discharge Exam:  Temp:  [96.7 F (35.9 C)-98.3 F (36.8 C)] 97.9 F (36.6 C) (11/11 0800) Pulse Rate:  [40-126] 94  (11/11 1000) Resp:  [20-29] 24  (  11/11 1000) BP:  (119-140)/(52-91) 127/54 mmHg (11/11 1000) SpO2:  [90 %-100 %] 95 % (11/11 1000) Weight:  [78 kg (171 lb 15.3 oz)] 171 lb 15.3 oz (78 kg) (11/11 0700) ] Gen: awake and in no distress.  ENT: Normal cephalic.  Neck: wnl JVD, No JVD Right IJ unremarkable.  Lungs: Right > left crackles. No accessory muscle use.  Cardiovascular: IRR, S1 S2 no m  Abdomen: soft and NT, no HSM, BS normal  Musculoskeletal: No deformities, no cyanosis or clubbing  Neuro: rass 0, alert and oriented.  Skin: Warm, no lesions or rashes    Labs at time of transfer Lab Results  Component Value Date   CREATININE 1.12 09/12/2011   BUN 34* 09/12/2011   NA 146* 09/12/2011   K 2.9* 09/12/2011   CL 109 09/12/2011   CO2 25 09/12/2011   Lab Results  Component Value Date   WBC 8.7 09/12/2011   HGB 8.6* 09/12/2011   HCT 26.0* 09/12/2011   MCV 81.5 09/12/2011   PLT 317 09/12/2011     Current radiology studies at time of transfer Dg Chest Portable 1 View  09/12/2011  *RADIOLOGY REPORT*  Clinical Data: Shortness of breath post extubation  PORTABLE CHEST - 1 VIEW  Comparison: 09/11/2011; 09/10/2011; 09/09/2011  Findings:  Unchanged cardiac silhouette and mediastinal contours with persistent obscuration of the right heart border secondary to consolidative opacity.  Interval increase in left-sided retrocardiac consolidative opacities.  Stable position of support apparatus.  No definite pneumothorax.  Blunting of bilateral costophrenic angles may suggest small bilateral effusions. Unchanged increased interstitial markings within the right upper lung.  Unchanged bones.  IMPRESSION: 1.  No change to minimal increase in left basilar consolidative opacities with findings of partial atelectasis/collapse of the bilateral lower lungs. 2.  Unchanged heterogeneous opacities within the right upper lung, possibly infectious in etiology, though asymmetric pulmonary edema may have a similar appearance.  Original Report Authenticated By:  Waynard Reeds, M.D.     MEDS  . albuterol  2.5 mg Nebulization Q6H  . antiseptic oral rinse  15 mL Mouth Rinse BID  . aspirin EC  81 mg Oral Daily  . furosemide  40 mg Intravenous Once  . furosemide  40 mg Intravenous Once  . prednisone  40 mg Oral  daily  . insulin aspart  0-9 Units Subcutaneous Q4H  . ipratropium  0.5 mg Nebulization Q6H  . metoprolol tartrate  25 mg Oral BID  . piperacillin-tazobactam (ZOSYN)  IV  3.375 g Intravenous Q8H  . potassium chloride  40 mEq Per Tube Once  . potassium chloride  40 mEq Oral TID  . simvastatin  10 mg Oral q1800  . sodium chloride  10 mL Intravenous Q12H  . DISCONTD: aspirin  324 mg Oral Once  . DISCONTD: aspirin  81 mg Per Tube Daily  . DISCONTD: levofloxacin (LEVAQUIN) IV  750 mg Intravenous Q48H  . DISCONTD: pantoprazole (PROTONIX) IV  40 mg Intravenous Q24H  . DISCONTD: potassium chloride  60 mEq Oral Once     Disposition: Step down unit  Discharged Condition: fair  Signed: Brenyn Petrey,PETE 09/12/2011, 11:02 AM

## 2011-09-13 ENCOUNTER — Inpatient Hospital Stay (HOSPITAL_COMMUNITY): Payer: Medicare Other

## 2011-09-13 DIAGNOSIS — I5023 Acute on chronic systolic (congestive) heart failure: Secondary | ICD-10-CM

## 2011-09-13 LAB — BASIC METABOLIC PANEL
BUN: 38 mg/dL — ABNORMAL HIGH (ref 6–23)
CO2: 26 mEq/L (ref 19–32)
CO2: 29 mEq/L (ref 19–32)
Calcium: 9.5 mg/dL (ref 8.4–10.5)
Chloride: 102 mEq/L (ref 96–112)
Creatinine, Ser: 1.15 mg/dL (ref 0.50–1.35)
GFR calc Af Amer: 66 mL/min — ABNORMAL LOW (ref >90)
Glucose, Bld: 154 mg/dL — ABNORMAL HIGH (ref 70–99)
Potassium: 4.2 mEq/L (ref 3.5–5.1)
Sodium: 144 mEq/L (ref 135–145)

## 2011-09-13 LAB — GLUCOSE, CAPILLARY: Glucose-Capillary: 125 mg/dL — ABNORMAL HIGH (ref 70–99)

## 2011-09-13 LAB — CBC
HCT: 27.1 % — ABNORMAL LOW (ref 39.0–52.0)
MCH: 26.7 pg (ref 26.0–34.0)
MCV: 81.4 fL (ref 78.0–100.0)
Platelets: 288 10*3/uL (ref 150–400)
RBC: 3.33 MIL/uL — ABNORMAL LOW (ref 4.22–5.81)

## 2011-09-13 LAB — HEPARIN LEVEL (UNFRACTIONATED): Heparin Unfractionated: 0.5 IU/mL (ref 0.30–0.70)

## 2011-09-13 MED ORDER — LEVALBUTEROL HCL 0.63 MG/3ML IN NEBU
0.6300 mg | INHALATION_SOLUTION | Freq: Four times a day (QID) | RESPIRATORY_TRACT | Status: DC
  Administered 2011-09-13 – 2011-09-14 (×4): 0.63 mg via RESPIRATORY_TRACT
  Filled 2011-09-13 (×8): qty 3

## 2011-09-13 MED ORDER — LOSARTAN POTASSIUM 25 MG PO TABS
12.5000 mg | ORAL_TABLET | Freq: Every day | ORAL | Status: DC
  Administered 2011-09-13 – 2011-09-14 (×2): 12.5 mg via ORAL
  Administered 2011-09-15: 10:00:00 via ORAL
  Administered 2011-09-16 – 2011-09-20 (×5): 12.5 mg via ORAL
  Filled 2011-09-13 (×9): qty 0.5

## 2011-09-13 MED ORDER — METOPROLOL SUCCINATE ER 25 MG PO TB24: 25.0000 mg | ORAL_TABLET | Freq: Two times a day (BID) | ORAL | Status: DC

## 2011-09-13 MED ORDER — SODIUM CHLORIDE 0.9 % IV SOLN
INTRAVENOUS | Status: DC
  Administered 2011-09-13: 10 mL/h via INTRAVENOUS
  Administered 2011-09-14: 20 mL/h via INTRAVENOUS
  Administered 2011-09-16: 10 mL/h via INTRAVENOUS

## 2011-09-13 MED ORDER — FUROSEMIDE 10 MG/ML IJ SOLN
40.0000 mg | Freq: Three times a day (TID) | INTRAMUSCULAR | Status: DC
  Administered 2011-09-13 – 2011-09-14 (×4): 40 mg via INTRAVENOUS
  Filled 2011-09-13 (×6): qty 4

## 2011-09-13 NOTE — Progress Notes (Signed)
Pt is currently off floor.  Will attempt later today if time allows. [place], [place] DPT [phone]

## 2011-09-13 NOTE — Progress Notes (Signed)
ANTICOAGULATION CONSULT NOTE - Follow Up Consult  Pharmacy Consult for heparin Indication: atrial fibrillation  No Known Allergies  Patient Measurements: Height: 6\' 1"  (185.4 cm) Weight: 167 lb 1.7 oz (75.8 kg) IBW/kg (Calculated) : 79.9   Vital Signs: BP: 140/73 mmHg (11/12 1145) Pulse Rate: 73  (11/12 1125)  Labs:  Basename 09/13/11 1500 09/13/11 0450 09/12/11 1500 09/12/11 0400 09/11/11 0545  HGB -- 8.9* -- 8.6* --  HCT -- 27.1* -- 26.0* 26.5*  PLT -- 288 -- 317 293  APTT -- -- -- -- --  LABPROT -- -- -- -- --  INR -- -- -- -- --  HEPARINUNFRC 0.50 0.26* 0.29* -- --  CREATININE -- 1.15 -- 1.12 1.07  CKTOTAL -- -- -- -- --  CKMB -- -- -- -- --  TROPONINI -- -- -- -- --   Estimated Creatinine Clearance: 50.4 ml/min (by C-G formula based on Cr of 1.15).  Assessment: 77yo male with Afib and now at goal on heparin  Goal of Therapy:  Heparin level 0.3-0.7 units/ml   Plan:  No heparin changes needed. Will continue same and follow coagulation plans (high CHADS2 but high risk of fall).  Benny Lennert PharmD BCPS 09/13/2011,3:38 PM

## 2011-09-13 NOTE — Progress Notes (Signed)
Physical Therapy Evaluation Patient Details Name: Todd [name] MRN: [id number] DOB: [birth year] Today's Date: 09/13/2011  Problem List:  Patient Active Problem List  Diagnoses  . HYPERLIPIDEMIA  . HYPERTENSION  . ALLERGIC RHINITIS  . PNEUMONIA, ORGANISM UNSPECIFIED  . ASTHMA  . BRONCHIECTASIS  . EDEMA  . Nonspecific (abnormal) findings on radiological and other examination of body structure  . COLON CANCER  . CHEST XRAY, ABNORMAL  . Pneumonia  . Sepsis  . Chest pain  . Atrial fibrillation  . Acute respiratory failure with hypoxia    Past Medical History:  Past Medical History  Diagnosis Date  . Asthma   . Hypertension   . Diabetes mellitus   . Cancer   . A-fib   . Hyperlipidemia   . Colon cancer   . Shortness of breath    Past Surgical History:  Past Surgical History  Procedure Date  . Colon surgery     Partial hemecolectomy   . Central line insertion 09/08/2011         PT Assessment/Plan/Recommendation PT Assessment Clinical Impression Statement: Pt will benefit from physical therapy in the acute setting to increase strength and functional mobility to prepare for safe discharge. PT Recommendation/Assessment: Patient will need skilled PT in the acute care venue PT Problem List: Decreased strength;Decreased activity tolerance;Decreased knowledge of use of DME;Decreased balance;Decreased mobility;Cardiopulmonary status limiting activity Barriers to Discharge: None PT Therapy Diagnosis : Difficulty walking;Abnormality of gait;Generalized weakness PT Plan PT Frequency: Min 3X/week PT Treatment/Interventions: DME instruction;Gait training;Stair training;Functional mobility training;Balance training;Patient/family education PT Recommendation Follow Up Recommendations: Home health PT;24 hour supervision/assistance Equipment Recommended: Defer to next venue PT Goals  Acute Rehab PT Goals PT Goal Formulation: With patient Time For Goal Achievement: 7 days Pt  will go Supine/Side to Sit: with modified independence PT Goal: Supine/Side to Sit - Progress: Progressing toward goal Pt will Transfer Sit to Stand/Stand to Sit: with modified independence PT Transfer Goal: Sit to Stand/Stand to Sit - Progress: Progressing toward goal Pt will Transfer Bed to Chair/Chair to Bed: with modified independence PT Transfer Goal: Bed to Chair/Chair to Bed - Progress: Progressing toward goal Pt will Ambulate: 51 - 150 feet;with modified independence;with least restrictive assistive device PT Goal: Ambulate - Progress: Progressing toward goal Pt will Go Up / Down Stairs: 1-2 stairs;with modified independence;with least restrictive assistive device PT Goal: Up/Down Stairs - Progress: Progressing toward goal  PT Evaluation Precautions/Restrictions  Precautions Precautions: Fall Required Braces or Orthoses: No Restrictions Weight Bearing Restrictions: No Prior Functioning  Home Living Lives With: Spouse Receives Help From: Family Type of Home: House Home Layout: One level Home Access: Stairs to enter [employer] Shower/Tub: Health visitor: Standard Bathroom Accessibility: Yes Home Adaptive Equipment: Built-in shower seat;Grab bars around toilet;Grab bars in shower;Walker - rolling;Straight cane;Wheelchair - manual Prior Function Level of Independence: Independent with basic ADLs;Independent with homemaking with ambulation;Independent with gait;Independent with transfers (Pt reports he did the yard work, wife does the housework) Driving: No ( Reports stopping after stroke) Vocation: Retired Leisure: Hobbies-yes (Comment) Comments: 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 Arousal/Alertness: Awake/alert Overall Cognitive Status: Appears within functional limits for tasks assessed Orientation Level: Oriented X4 Cognition - Other Comments: Pt has very slight trouble remembering some words, reports after PMH of stroke Sensation/Coordination     Extremity Assessment RUE Assessment RUE Assessment: Within Functional Limits LUE Assessment LUE Assessment: Within Functional Limits RLE Assessment RLE Assessment: Within Functional Limits LLE Assessment LLE Assessment: Within Functional Limits Mobility (including  Balance) Bed Mobility Bed Mobility: Yes Supine to Sit: 5: Supervision Supine to Sit Details (indicate cue type and reason): Supervision for pt safety.  Verbal instructions for LE movement and UE placement. Sitting - Scoot to Edge of Bed: 5: Supervision Sitting - Scoot to Edge of Bed Details (indicate cue type and reason): Supervision for pt safety.  Verbal instructions to initiate. Transfers Transfers: Yes Sit to Stand: 4: Min assist;From elevated surface;With upper extremity assist;From bed Sit to Stand Details (indicate cue type and reason): Min A to initiate and complete movement.  A for balance and pt safety.  Stand to Sit: 4: Min assist;With upper extremity assist;To chair/3-in-1;With armrests Stand to Sit Details: Min A for eccentric control.  Verbal cues for hand placement on armrests before initiating sit.   Ambulation/Gait Ambulation/Gait: Yes Ambulation/Gait Assistance: 4: Min assist Ambulation/Gait Assistance Details (indicate cue type and reason): Min A for pt safety and to maintain balance.  Verbal cues for proper use of rolling walker.  A to move IV pole and bring chair behind pt. Ambulation Distance (Feet): 5 Feet (Distance was limited due to lines and leads, and pt fatigue) Assistive device: Rolling walker Gait Pattern: Shuffle;Trunk flexed;Decreased step length - right;Decreased step length - left Gait velocity: Decreased, but speed not measured due to limited ambulation distance Stairs: No Wheelchair Mobility Wheelchair Mobility: No  Posture/Postural Control Posture/Postural Control: Postural limitations Postural Limitations: Kyphotic posture Balance Balance Assessed: No Exercise    Vitals: HR:   73 bpm O2 Sats:  with 2 L O2 94% BP:  Lying - 141/73    Sitting 132/100 Post-treatment - 140/73  End of Session PT - End of Session Equipment Utilized During Treatment: Gait belt;Other (comment) (RW, O2) Activity Tolerance: Patient limited by fatigue (Pt reported feeling weak due to not being out of bed much) Patient left: in bed;with call bell in reach;with family/visitor present General Behavior During Session: [facility] for tasks performed Cognition: [facility] for tasks performed  Lacinda Axon 09/13/2011, 1:25 PM

## 2011-09-13 NOTE — Procedures (Signed)
Modified Barium Swallow Procedure Note Patient Details  Name: Todd [name] MRN: [id number] Date of Birth: 09/10/1924  Today's Date: 09/13/2011 Time: 1191-4782 Time Calculation (min): 30 min  Past Medical History:  Past Medical History  Diagnosis Date  . Asthma   . Hypertension   . Diabetes mellitus   . Cancer   . A-fib   . Hyperlipidemia   . Colon cancer   . Shortness of breath     HPI:  COPD/Asthma, PNA, Bronchiectasis (Dr Craige Cotta, 2009), CVA 8/12 (D/C from SNF 8/12 for same). Admitted by [facility] 09/06/11 for HCAP, Sepsis, NSTEMI (cards), Gram + cocci in sputum, 11/7 increased SOB/WOB, worsening CXR, Intubated, likley asp pna, 11/6 ETT>>>11/9.  Recommendation/Prognosis  Clinical impression: Demonstrates a mild-moderate pharyngeal phase dysphagia with an unclear sole etiology and likely combination of debilitating factors that are contributing to silent aspiration of thin liquids and pharyngeal residues.  Etiologies include recent left hemisphere watershed infarcts in 8/12, COPD, current hypoxia from pna as well as overall deconditioning, all impacting airway protection.  Evidence of a suspected secondary esophageal dysphagia given amount of UES residue without a significant hyoid-laryngeal impairment.  The identification of silent aspiration may have been patient's baseline, contributing to this hospitalization.  Therefore, guarded prognosis for return to thin liquids during this hospitalization.  Recommendations 1.  Dysphagia 3 (Mechanical soft) & Nectar-thick liquids 2.  Whole meds with liquid 3.  Compensations: Slow rate;Small sips/bites;Follow solids with liquid;Multiple dry swallows after each bite/sip 4.  Seated upright 90 degrees;Upright 30-60 min after meal  SLP Goals  1.  Patient will consume Dys.3/nectar-thick liquids without any overt s/s of aspiration and modified independence. 2.  Patient will utilize airway compensatory strategies (e.g., 1-2 extra swallows, follow solid  with liquids) as a modified independent.   Myra Rude, M.S.,CCC-SLP Pager [phone]

## 2011-09-13 NOTE — Progress Notes (Signed)
Subjective: Assumption of care note 77 yo male, With past medical history of atrial fibrillation, chronic obstructive pulmonary disease, and bronchiectasis. He is followed by Dr. Craige Cotta in the outpatient setting. He was admitted by internal medicine on 09-06-2011 to the step down unit from skilled nursing facility with dyspnea, productive cough, and gram-positive cocci in his sputum. On 11-6 he developed increased shortness of breath, worsening aeration on chest film, and acute respiratory failure. He was intubated, placed on the mechanical ventilator, and broad-spectrum antibiotics were continued. Further diagnostic evaluation was also consistent with what appeared to be non-ST elevation MI, resulting in pulmonary edema superimposed on pneumonia. He was treated initially with sedation, mechanical ventilation, and broad-spectrum and empiric antibiotics. He was successfully weaned and later extubated on 09-10-2011  The patient is sitting up at the bedside and is doing quite well today. He denies fevers chills nausea vomiting or shortness of breath. He does report that he still feels quite weak. His appetite has not yet improve significantly.  We are assuming primary care of this patient as of today.  Objective: Weight change: -2.2 kg (-4 lb 13.6 oz)  Intake/Output Summary (Last 24 hours) at 09/13/11 1527 Last data filed at 09/13/11 1100  Gross per 24 hour  Intake    935 ml  Output    826 ml  Net    109 ml   Blood pressure 140/73, pulse 73, temperature 97.7 F (36.5 C), temperature source Oral, resp. rate 23, height 6\' 1"  (1.854 m), weight 75.8 kg (167 lb 1.7 oz), SpO2 100.00%. Temp:  [97.3 F (36.3 C)-97.7 F (36.5 C)] 97.7 F (36.5 C) (11/11 2000) Pulse Rate:  [58-119] 73  (11/12 1125) Resp:  [19-27] 23  (11/12 0800) BP: (122-145)/(60-100) 140/73 mmHg (11/12 1145) SpO2:  [90 %-100 %] 100 % (11/12 1441) Weight:  [75.8 kg (167 lb 1.7 oz)] 167 lb 1.7 oz (75.8 kg) (11/12 0700)  Physical  Exam: General: No acute respiratory distress Lungs: Clear to auscultation bilaterally with exception to mild bibasilar crackles Cardiovascular: Regular rate at present with distant heart sounds with no appreciable gallop or rub Abdomen: Nontender, nondistended, soft, bowel sounds positive, no rebound, no ascites, no appreciable mass Extremities: No significant cyanosis, clubbing, or edema bilateral lower extremities  Lab Results:  Basename 09/13/11 0450 09/12/11 0400 09/11/11 0545  NA 145 146* 143  K 3.9 2.9* 3.7  CL 109 109 107  CO2 26 25 22   GLUCOSE 154* 189* 130*  BUN 38* 34* 33*  CREATININE 1.15 1.12 1.07  CALCIUM 9.5 9.4 9.4  MG -- -- --  PHOS -- -- --   No results found for this basename: AST:2,ALT:2,ALKPHOS:2,BILITOT:2,PROT:2,ALBUMIN:2 in the last 72 hours No results found for this basename: LIPASE:2,AMYLASE:2 in the last 72 hours  Basename 09/13/11 0450 09/12/11 0400 09/11/11 0545  WBC 10.3 8.7 8.9  NEUTROABS -- -- 7.8*  HGB 8.9* 8.6* 8.7*  HCT 27.1* 26.0* 26.5*  MCV 81.4 81.5 82.6  PLT 288 317 293   No results found for this basename: CKTOTAL:3,CKMB:3,CKMBINDEX:3,TROPONINI:3 in the last 72 hours No results found for this basename: POCBNP:3 in the last 72 hours No results found for this basename: DDIMER:2 in the last 72 hours No results found for this basename: HGBA1C:12 in the last 72 hours No results found for this basename: CHOL:2,HDL:2,LDLCALC:2,TRIG:2,CHOLHDL:2,LDLDIRECT:2 in the last 72 hours No results found for this basename: TSH,T4TOTAL,FREET3,T3FREE,THYROIDAB in the last 72 hours No results found for this basename: VITAMINB12:2,FOLATE:2,FERRITIN:2,TIBC:2,IRON:2,RETICCTPCT:2 in the last 72 hours  Micro  Results: Recent Results (from the past 240 hour(s))  CULTURE, BLOOD (ROUTINE X 2)     Status: Normal   Collection Time   09/06/11 11:31 AM      Component Value Range Status Comment   Specimen Description BLOOD LEFT ARM   Final    Special Requests BOTTLES  DRAWN AEROBIC ONLY 10CC   Final    Setup Time [id number]   Final    Culture NO GROWTH 5 DAYS   Final    Report Status 09/12/2011 FINAL   Final   CULTURE, BLOOD (ROUTINE X 2)     Status: Normal   Collection Time   09/06/11 11:50 AM      Component Value Range Status Comment   Specimen Description BLOOD LEFT WRIST   Final    Special Requests BOTTLES DRAWN AEROBIC ONLY 10CC   Final    Setup Time [id number]   Final    Culture NO GROWTH 5 DAYS   Final    Report Status 09/12/2011 FINAL   Final   GRAM STAIN     Status: Normal   Collection Time   09/06/11 12:02 PM      Component Value Range Status Comment   Specimen Description SPUTUM   Final    Special Requests NONE   Final    Gram Stain     Final    Value: FEW WBC PRESENT,BOTH PMN AND MONONUCLEAR     ABUNDANT GRAM POSITIVE COCCI IN PAIRS IN CLUSTERS     ABUNDANT GRAM POSITIVE COCCI IN PAIRS IN CHAINS     RARE GRAM POSITIVE RODS     RARE GRAM NEGATIVE RODS   Report Status 09/06/2011 FINAL   Final   CULTURE, RESPIRATORY     Status: Normal   Collection Time   09/06/11 12:02 PM      Component Value Range Status Comment   Specimen Description SPUTUM   Final    Special Requests NONE   Final    Gram Stain     Final    Value: FEW WBC PRESENT,BOTH PMN AND MONONUCLEAR     ABUNDANT SQUAMOUS EPITHELIAL CELLS PRESENT     ABUNDANT GRAM POSITIVE COCCI IN PAIRS AND CHAINS     IN CLUSTERS RARE GRAM POSITIVE RODS     RARE GRAM NEGATIVE RODS   Culture NORMAL OROPHARYNGEAL FLORA   Final    Report Status 09/08/2011 FINAL   Final   URINE CULTURE     Status: Normal   Collection Time   09/06/11  2:03 PM      Component Value Range Status Comment   Specimen Description URINE, CLEAN CATCH   Final    Special Requests NONE   Final    Setup Time [id number]   Final    Colony Count NO GROWTH   Final    Culture NO GROWTH   Final    Report Status 09/07/2011 FINAL   Final   CULTURE, BLOOD (ROUTINE X 2)     Status: Normal   Collection Time   09/06/11  5:10  PM      Component Value Range Status Comment   Specimen Description BLOOD RIGHT ARM   Final    Special Requests BOTTLES DRAWN AEROBIC AND ANAEROBIC [facility]   Final    Setup Time [id number]   Final    Culture NO GROWTH 5 DAYS   Final    Report Status 09/12/2011 FINAL   Final   CULTURE, BLOOD (ROUTINE X 2)  Status: Normal   Collection Time   09/06/11  5:19 PM      Component Value Range Status Comment   Specimen Description BLOOD LEFT ARM   Final    Special Requests BOTTLES DRAWN AEROBIC AND ANAEROBIC [facility]   Final    Setup Time [id number]   Final    Culture NO GROWTH 5 DAYS   Final    Report Status 09/12/2011 FINAL   Final   MRSA PCR SCREENING     Status: Normal   Collection Time   09/06/11  5:33 PM      Component Value Range Status Comment   MRSA by PCR NEGATIVE  NEGATIVE  Final     Studies/Results: Scheduled Meds:   . albuterol  2.5 mg Nebulization Q6H  . aspirin EC  81 mg Oral Daily  . furosemide  40 mg Intravenous TID  . ipratropium  0.5 mg Nebulization Q6H  . losartan  12.5 mg Oral Daily  . piperacillin-tazobactam (ZOSYN)  IV  3.375 g Intravenous Q8H  . potassium chloride  40 mEq Oral TID  . predniSONE  40 mg Oral QAC breakfast  . simvastatin  10 mg Oral q1800  . sodium chloride  10 mL Intravenous Q12H  . DISCONTD: insulin aspart  0-9 Units Subcutaneous Q4H  . DISCONTD: metoprolol succinate  25 mg Oral BID  . DISCONTD: metoprolol tartrate  25 mg Oral BID   Continuous Infusions:   . sodium chloride 10 mL/hr (09/13/11 0813)  . heparin 1,400 Units/hr (09/13/11 0809)   PRN Meds:.acetaminophen, albuterol, food thickener, morphine injection, ondansetron (ZOFRAN) IV, sodium chloride  Assessment/Plan:   Acute respiratory failure with hypoxia in setting Pneumonia (presumed aspiration) The plan at this time will be to complete an 8 day total course of antibiotic therapy. Would continue supplemental oxygen and wean as tolerated, continue bronchodilators, and  continue aggressive diuresis as BUN creatinine and blood pressure allow.   Aspiration Please see the speech and language pathology consultation today. It appears clear the patient is still suffering with significant dysphagia from his recent CVA. We will begin attempts to rehabilitation the patient's swallow and we'll modify his diet as per the MBS reccs at this time.  Resolved sepsis: This was treated in the usual fashion with IV antibiotics, goal-directed therapy,and critical care support. This is now resolved. Antibiotics have been narrowed.   NSTEMI: There's likely complicated his acute respiratory failure. He's now hemodynamically stable. He is on aspirin, and beta blockade. Cardiology is following. He is not a cardiac catheterization candidate. Further treatment will be for medical therapy.   Atrial fibrillation: currently regular rate. Controlled ventricular response, maintained on beta-blockade, also currently on heparin drip, but it is not felt that he is a long-term anticoagulation candidate. Please see detailed discussion as per Cardiology.  Adrenal insufficiency: this was treated with stress dose steroids.  Now on oral prednisone with taper over 2 week period.        LOS: 7 days   MCCLUNG,JEFFREY T 09/13/2011, 3:27 PM

## 2011-09-13 NOTE — Progress Notes (Signed)
Subjective:  He reports feeling better-states that his SOB is getting better. He complains of some nasal stuffiness and congestion.   Objective:  Vital Signs in the last 24 hours: Temp:  [97.3 F (36.3 C)-97.9 F (36.6 C)] 97.7 F (36.5 C) (11/11 2000) Pulse Rate:  [58-126] 74  (11/12 0400) Resp:  [19-27] 25  (11/12 0400) BP: (110-145)/(52-80) 136/74 mmHg (11/12 0400) SpO2:  [90 %-99 %] 95 % (11/12 0100)  Intake/Output from previous day: 11/11 0701 - 11/12 0700 In: 1856.5 [P.O.:1200; I.V.:656.5] Out: 400 [Urine:400]  Physical Exam: Pt is alert and oriented, NAD HEENT: normal Neck: JVP - normal, carotids 2+= without bruits Lungs: good air movement bilaterally, diffuse bilateral rhonchi, mild bibasilar rales CV: RRR without murmur or gallop Abd: soft, NT, Positive BS, no hepatomegaly Ext: no pedal edema, distal pulses intact and equal Skin: warm/dry no rash  Meds:  Current Facility-Administered Medications  Medication Dose Route Frequency Provider Last Rate Last Dose  . acetaminophen (TYLENOL) tablet 650 mg  650 mg Oral Q6H PRN Sosan Abdullah   650 mg at 09/12/11 2045  . albuterol (PROVENTIL) (5 MG/ML) 0.5% nebulizer solution 2.5 mg  2.5 mg Nebulization Q6H Braedan S Minor, NP   2.5 mg at 09/13/11 0140  . albuterol (PROVENTIL) (5 MG/ML) 0.5% nebulizer solution 2.5 mg  2.5 mg Nebulization Q3H PRN Elizabeth Deterding   2.5 mg at 09/11/11 0247  . aspirin EC tablet 81 mg  81 mg Oral Daily Elwin Sleight, PHARMD   81 mg at 09/12/11 6578  . food thickener (THICK IT) powder   Oral PRN Mcarthur Rossetti. Tyson Alias      . furosemide (LASIX) injection 40 mg  40 mg Intravenous Once Megha Sawhney   40 mg at 09/12/11 0950  . heparin ADULT infusion 100 units/mL (25000 units/250 mL)  1,400 Units/hr Intravenous Continuous Colleen Can, PHARMD 12 mL/hr at 09/12/11 0941 1,200 Units/hr at 09/12/11 0941  . insulin aspart (novoLOG) injection 0-9 Units  0-9 Units Subcutaneous Q4H Mcarthur Rossetti. Feinstein    1 Units at 09/13/11 0501  . ipratropium (ATROVENT) nebulizer solution 0.5 mg  0.5 mg Nebulization Q6H Elizabeth Deterding   0.5 mg at 09/13/11 0140  . metoprolol tartrate (LOPRESSOR) tablet 25 mg  25 mg Oral BID Megha Sawhney   25 mg at 09/12/11 2158  . morphine 2 MG/ML injection 0.5 mg  0.5 mg Intravenous Q4H PRN Vassie Loll, MD      . ondansetron [facility]) injection 4 mg  4 mg Intravenous Q6H PRN Sosan Abdullah      . piperacillin-tazobactam (ZOSYN) IVPB 3.375 g  3.375 g Intravenous Q8H Drake Leach Rumbarger, PHARMD   3.375 g at 09/13/11 0030  . potassium chloride SA (K-DUR,KLOR-CON) CR tablet 40 mEq  40 mEq Oral TID Megha Sawhney   40 mEq at 09/12/11 2158  . predniSONE (DELTASONE) tablet 40 mg  40 mg Oral QAC breakfast Anders Simmonds, NP   40 mg at 09/12/11 1312  . simvastatin (ZOCOR) tablet 10 mg  10 mg Oral q1800 Sosan Abdullah   10 mg at 09/11/11 1749  . sodium chloride (OCEAN) 0.65 % nasal spray 1 spray  1 spray Each Nare PRN Melissa L Taylor   1 spray at 09/08/11 0817  . sodium chloride 0.9 % injection 10 mL  10 mL Intravenous Q12H Mcarthur Rossetti. Feinstein   10 mL at 09/12/11 2159  . DISCONTD: 0.9 %  sodium chloride infusion   Intravenous Continuous Vilinda Blanks Minor, NP 50 mL/hr  at 09/10/11 1900    . DISCONTD: 0.9 %  sodium chloride infusion   Intravenous Continuous Mcarthur Rossetti. Feinstein 20 mL/hr at 09/10/11 1128 20 mL at 09/10/11 1128  . DISCONTD: acetaminophen (TYLENOL) suppository 650 mg  650 mg Rectal Q6H PRN Sosan Abdullah      . DISCONTD: antiseptic oral rinse (BIOTENE) solution 15 mL  15 mL Mouth Rinse BID Mcarthur Rossetti. Feinstein   15 mL at 09/12/11 0800  . DISCONTD: hydrocortisone sodium succinate (SOLU-CORTEF) injection 50 mg  50 mg Intravenous Q6H Daniel J. Feinstein   50 mg at 09/12/11 1610  . DISCONTD: potassium chloride SA (K-DUR,KLOR-CON) CR tablet 60 mEq  60 mEq Oral Once [facility] Deterding         Lab Results:  Basename 09/13/11 0450 09/12/11 0400  WBC 10.3 8.7  HGB 8.9* 8.6*    PLT 288 317    Basename 09/13/11 0450 09/12/11 0400  NA 145 146*  K 3.9 2.9*  CL 109 109  CO2 26 25  GLUCOSE 154* 189*  BUN 38* 34*  CREATININE 1.15 1.12    Imaging: CXR 09/11/11: 1. No change to minimal increase in left basilar consolidative  opacities with findings of partial atelectasis/collapse of the  bilateral lower lungs.  2. Unchanged heterogeneous opacities within the right upper lung,  possibly infectious in etiology, though asymmetric pulmonary edema  may have a similar appearance.   Cardiac Studies: No new images.  Assessment/Plan:  Pt is a 77 y.o. yo male with a PMHx of non-oxygen dependent COPD, Atrial fibrillation (not on anticoagulation secondary to fall risk), recent stroke in August 2012 with discharge to SNF who was admitted on 09/06/2011 with symptoms of shortness of breath and cough x 1 week, which was determined to be secondary to HCAP. Despite antibiotic treatment, continued to be tachypneic into 40s, requiring intubation on 09/08/11 for airway protection and management, with extubation on 09/10/2011. Cardiology was consulted at admission secondary to elevated troponins of 1 that have been uptrending to maximum of 2.6, thought to be due to demand ischemia in setting of HCAP and respiratory failure.   1. Acute respiratory failure with hypoxia (09/06/2011):  In the setting of HCAP and systolic CHF with EF- 25%. S/p extubation on 11/9. He was feeling SOB,   post extubation that was thought to be likely secondary to fluid overload ( increase in weight by 7 lbs).He reports that his SOB  is getting better but complains of some nasal stuffiness today. On lung exam , he has mild bibasilar crackles but much improved from yesterday. He is 3 kgs down in his weight from yesterday with improved output but still net positive.   Plan:  Symptomatic treatment of sinus congestion with normal saline nasal drops and flonase nasal spray.              We may consider low dose lasix  today.              Continue antibiotics and steroids per CCM.   2. Chest pain (09/06/2011): Denies any chest pain today. His elevated troponin's were likely secondary to demand ischemia from his pneumonia and intercurrent illness.      Plan:  Continue medical management with aspirin, statin , BB blocker.     3. Atrial fibrillation (09/06/2011): rate- controlled with current dose of BB blocker that was started yesterday. His HR increased with minimal activity yesterday , so we need to watch that- he would be a good candidate for cardiac rehab.  Continue heparin for now. The decision whether to continue anticoagulation as long term  is still pending. Though his CHADS2 score is 6, Dr. Daleen Squibb does not think that he is a great coumadin candidate given his risks of fall.      Plan: Continue Metoprolol at current dose.            Continue heparin for now.             Decision for long term anti- coagulation is pending.            Palliative care consult is pending.  4. Hypokalemia; secondary to lasix.>> Resolved today.  Plan: continue to monitor.   5. Ejection fraction 20-25% (2D Echo 09/08/11) - never cath'ed.  Home ARB on hold secondary to hypotension. He was started on low dose metoprolol yesterday that he is tolerating well. He is net positive for his I and O's but his weight ids down by 3 kgs. Monitor daily weight, strict I/O      SAWHNEY,MEGHA, M.D. 09/13/2011, 7:07 AM   Patient seen with resident, agree with note.    Patient is stable today.  Still appears volume overloaded on exam with JVP 12 cm.  He does not appear to be diuresing well and is over 1 liter net positive from yesterday.    1. CHF: Acute on chronic diastolic.  Needs better diuresis, quite volume overloaded.  Lasix 40 mg IV q8 hrs.  Will start back on low dose ARB, losartan 12.5 mg daily (creatinine has been stable).   2. Atrial fibrillation: Persistent.  Rate control ok. Change metoprolol to Toprol XL 25 mg bid.   Decision has been made to hold off on anticoagulation given fall risk, think this is reasonable but will see what PT says when they evaluate him.  Would not restart diltiazem given low EF.  3. CAP: Antibiotics per CCM.  Swallow study today (? Aspiration PNA).  4. Palliative care consult pending.  5. Transfer to step down. PT.

## 2011-09-13 NOTE — Plan of Care (Cosign Needed)
Problem: Phase III Progression Outcomes Goal: Other Phase III Outcomes/Goals Outcome: Not Applicable Date Met:  09/13/11 Todd [name]     Problem: Phase II Progression Outcomes Goal: O2 sats > equal to 90% on RA or at baseline Outcome: Progressing Progressing somewhat the patient was on O2 @ 3l/[place] ,now is on 2L/[place] w/same sats .Slightly decreased DOE . Goal: Dyspnea controlled w/progressive activity Outcome: Progressing Slightly improved . Goal: ADLs completed with minimal assistance Outcome: Progressing Slightly progressing

## 2011-09-13 NOTE — Progress Notes (Signed)
ANTICOAGULATION CONSULT NOTE - Follow Up Consult  Pharmacy Consult for heparin Indication: atrial fibrillation  No Known Allergies  Patient Measurements: Height: 6\' 1"  (185.4 cm) Weight: 171 lb 15.3 oz (78 kg) IBW/kg (Calculated) : 79.9   Vital Signs: Temp: 97.7 F (36.5 C) (11/11 2000) Temp src: Oral (11/11 2000) BP: 136/74 mmHg (11/12 0400) Pulse Rate: 74  (11/12 0400)  Labs:  Basename 09/13/11 0450 09/12/11 1500 09/12/11 0400 09/11/11 0545  HGB 8.9* -- 8.6* --  HCT 27.1* -- 26.0* 26.5*  PLT 288 -- 317 293  APTT -- -- -- --  LABPROT -- -- -- --  INR -- -- -- --  HEPARINUNFRC 0.26* 0.29* 0.21* --  CREATININE 1.15 -- 1.12 1.07  CKTOTAL -- -- -- --  CKMB -- -- -- --  TROPONINI -- -- -- --   Estimated Creatinine Clearance: 51.8 ml/min (by C-G formula based on Cr of 1.15).  Assessment: 77yo male with Afib remains subtherapeutic on heparin, trending down.  Goal of Therapy:  Heparin level 0.3-0.7 units/ml   Plan:  Will increase heparin gtt by 2 units/kg/hr to 1400 units/hr and check level in 8hr.  Colleen Can PharmD BCPS 09/13/2011,6:29 AM

## 2011-09-14 DIAGNOSIS — J9600 Acute respiratory failure, unspecified whether with hypoxia or hypercapnia: Secondary | ICD-10-CM

## 2011-09-14 LAB — CBC
HCT: 27.5 % — ABNORMAL LOW (ref 39.0–52.0)
Hemoglobin: 8.9 g/dL — ABNORMAL LOW (ref 13.0–17.0)
RBC: 3.39 MIL/uL — ABNORMAL LOW (ref 4.22–5.81)
WBC: 12.2 10*3/uL — ABNORMAL HIGH (ref 4.0–10.5)

## 2011-09-14 LAB — BASIC METABOLIC PANEL
BUN: 42 mg/dL — ABNORMAL HIGH (ref 6–23)
Chloride: 101 mEq/L (ref 96–112)
GFR calc Af Amer: 50 mL/min — ABNORMAL LOW (ref >90)
GFR calc non Af Amer: 43 mL/min — ABNORMAL LOW (ref >90)
Glucose, Bld: 133 mg/dL — ABNORMAL HIGH (ref 70–99)
Potassium: 4 mEq/L (ref 3.5–5.1)
Sodium: 143 mEq/L (ref 135–145)

## 2011-09-14 LAB — IRON AND TIBC: Saturation Ratios: 5 % — ABNORMAL LOW (ref 20–55)

## 2011-09-14 LAB — FOLATE: Folate: 11.1 ng/mL

## 2011-09-14 LAB — RETICULOCYTES
RBC.: 3.42 MIL/uL — ABNORMAL LOW (ref 4.22–5.81)
Retic Count, Absolute: 102.6 10*3/uL (ref 19.0–186.0)

## 2011-09-14 LAB — HEPARIN LEVEL (UNFRACTIONATED): Heparin Unfractionated: 0.45 IU/mL (ref 0.30–0.70)

## 2011-09-14 LAB — VITAMIN B12: Vitamin B-12: 476 pg/mL (ref 211–911)

## 2011-09-14 LAB — GLUCOSE, CAPILLARY: Glucose-Capillary: 196 mg/dL — ABNORMAL HIGH (ref 70–99)

## 2011-09-14 MED ORDER — FUROSEMIDE 40 MG PO TABS
40.0000 mg | ORAL_TABLET | Freq: Every day | ORAL | Status: DC
  Administered 2011-09-15 – 2011-09-17 (×3): 40 mg via ORAL
  Filled 2011-09-14 (×4): qty 1

## 2011-09-14 MED ORDER — LEVALBUTEROL HCL 0.63 MG/3ML IN NEBU
0.6300 mg | INHALATION_SOLUTION | Freq: Four times a day (QID) | RESPIRATORY_TRACT | Status: DC | PRN
  Administered 2011-09-15: 0.63 mg via RESPIRATORY_TRACT
  Filled 2011-09-14: qty 3

## 2011-09-14 NOTE — Progress Notes (Signed)
Report given to Chat, Charity fundraiser. Pt transferred to stepdown with all belongings.

## 2011-09-14 NOTE — Progress Notes (Signed)
Subjective:  He reports feeling better but states that he could not get good night sleep. He states that he became very SOB while working with PT yesterday only with minimal activity.  Objective:  Vital Signs in the last 24 hours: Temp:  [97.1 F (36.2 C)-98.3 F (36.8 C)] 98.3 F (36.8 C) (11/13 0400) Pulse Rate:  [64-73] 72  (11/13 0500) Resp:  [20-23] 21  (11/13 0500) BP: (103-133)/(47-70) 125/54 mmHg (11/13 0500) SpO2:  [94 %-100 %] 100 % (11/13 0500) Weight:  [157 lb 10.1 oz (71.5 kg)] 157 lb 10.1 oz (71.5 kg) (11/13 0500)  Intake/Output from previous day: 11/12 0701 - 11/13 0700 In: 1762 [P.O.:850; I.V.:754; IV Piggyback:158] Out: 4780 [Urine:4780]  Physical Exam: Pt is alert and oriented, NAD HEENT: normal Neck: JVP - normal, carotids 2+= without bruits Lungs: CTA bilaterally CV: RRR without murmur or gallop Abd: soft, NT, Positive BS, no hepatomegaly Ext: no C/C/E, distal pulses intact and equal Skin: warm/dry no rash   Lab Results:  Basename 09/14/11 0500 09/13/11 0450  WBC 12.2* 10.3  HGB 8.9* 8.9*  PLT 275 288    Basename 09/14/11 0500 09/13/11 1755  NA 143 144  K 4.0 4.2  CL 101 102  CO2 33* 29  GLUCOSE 133* 175*  BUN 42* 36*  CREATININE 1.42* 1.14   Current Facility-Administered Medications  Medication Dose Route Frequency Provider Last Rate Last Dose  . 0.9 %  sodium chloride infusion   Intravenous Continuous Mcarthur Rossetti. Feinstein 20 mL/hr at 09/14/11 0700    . acetaminophen (TYLENOL) tablet 650 mg  650 mg Oral Q6H PRN Sosan Abdullah   650 mg at 09/12/11 2045  . aspirin EC tablet 81 mg  81 mg Oral Daily Elwin Sleight, PHARMD   81 mg at 09/13/11 1020  . food thickener (THICK IT) powder   Oral PRN Mcarthur Rossetti. Tyson Alias      . furosemide (LASIX) injection 40 mg  40 mg Intravenous TID Megha Sawhney   40 mg at 09/13/11 2144  . heparin ADULT infusion 100 units/mL (25000 units/250 mL)  1,400 Units/hr Intravenous Continuous Colleen Can, PHARMD 14  mL/hr at 09/14/11 0700 14 mL/hr at 09/14/11 0700  . ipratropium (ATROVENT) nebulizer solution 0.5 mg  0.5 mg Nebulization Q6H Elizabeth Deterding   0.5 mg at 09/14/11 0201  . levalbuterol (XOPENEX) nebulizer solution 0.63 mg  0.63 mg Nebulization Q6H Jeffrey T McClung   0.63 mg at 09/14/11 0202  . losartan (COZAAR) tablet 12.5 mg  12.5 mg Oral Daily Megha Sawhney   12.5 mg at 09/13/11 1542  . morphine 2 MG/ML injection 0.5 mg  0.5 mg Intravenous Q4H PRN Vassie Loll, MD      . ondansetron [facility]) injection 4 mg  4 mg Intravenous Q6H PRN Sosan Abdullah      . piperacillin-tazobactam (ZOSYN) IVPB 3.375 g  3.375 g Intravenous Q8H Drake Leach Rumbarger, PHARMD   3.375 g at 09/14/11 0015  . potassium chloride SA (K-DUR,KLOR-CON) CR tablet 40 mEq  40 mEq Oral TID Megha Sawhney   40 mEq at 09/13/11 2144  . predniSONE (DELTASONE) tablet 40 mg  40 mg Oral QAC breakfast Anders Simmonds, NP   40 mg at 09/13/11 0820  . simvastatin (ZOCOR) tablet 10 mg  10 mg Oral q1800 Sosan Abdullah   10 mg at 09/13/11 1755  . sodium chloride (OCEAN) 0.65 % nasal spray 1 spray  1 spray Each Nare PRN Melissa L Taylor   1 spray at  09/08/11 0817  . sodium chloride 0.9 % injection 10 mL  10 mL Intravenous Q12H Mcarthur Rossetti. Feinstein   10 mL at 09/13/11 2146      Imaging: No new imaging  Cardiac Studies: No new studies  Assessment/Plan:  Pt is a 77 y.o. yo male with a PMHx of non-oxygen dependent COPD, Atrial fibrillation (not on anticoagulation secondary to fall risk), recent stroke in August 2012 with discharge to SNF who was admitted on 09/06/2011 with symptoms of shortness of breath and cough x 1 week, which was determined to be secondary to HCAP. Despite antibiotic treatment, continued to be tachypneic into 40s, requiring intubation on 09/08/11 for airway protection and management, with extubation on 09/10/2011. Cardiology was consulted at admission secondary to elevated troponins of 1 that have been uptrending to maximum of  2.6, thought to be due to demand ischemia in setting of HCAP and respiratory failure.  1. Acute respiratory failure with hypoxia (09/06/2011): In the setting of HCAP and systolic CHF with EF- 25%. S/p extubation on 11/9. He was feeling SOB, post extubation that was thought to be likely secondary to fluid overload ( increase in weight ).He reports that his SOB is getting better. Lungs are clear to ausculattion. He is 3 -4 kgs down in his weight from yesterday with net negative of  3 litres. His creatinine has started trending up Plan:  Will hold lasix for now Continue antibiotics and steroids per CCM.   2. Chest pain (09/06/2011): Denies any chest pain today. His elevated troponin's were likely secondary to demand ischemia from his pneumonia and intercurrent illness.  Plan: Continue medical management with aspirin, statin , BB blocker.   3. Atrial fibrillation (09/06/2011): rate- controlled with current dose of BB blocker that was started yesterday.  Continue heparin for now. The decision whether to continue anticoagulation as long term is still pending. His CHADS2 score is 6,  We may consider starting him on coumadin as outpatient.He was started on Toprol yesterday that he is tolerating well. Plan: Continue toprol at current dose. Continue heparin for now.  We may consider starting him on coumadin as outpatient. Palliative care consult is pending.  4. Hypokalemia; secondary to lasix.>> Resolved today.  Plan: continue to monitor.   5. Ejection fraction 20-25% (2D Echo 09/08/11) - never cath'ed. Home ARB on hold secondary to hypotension. He was started on low dose toprol and losartan yesterday that he is tolerating well. He is net negative or his I and O's but his weight is down by 3 kgs.  Monitor daily weight, strict I/O Hold lasix for today.6. Dispo- Transfer to step down when bed is available.       SAWHNEY,MEGHA, M.D. 09/14/2011, 7:35 AM  History reviewed with patient, and with residents.   Patient exam reveals clear lung fields  Patient appears to be quite frail.  All available labs, radiology testing, previous records reviewed.  Agree with documented assessment and plan.  I agree that unless his overall strength can be improved with PT, his high fall risk  would preclude Pradaxa or warfarin.09/14/2011, 9:35 AM, Cassell Clement

## 2011-09-14 NOTE — Progress Notes (Signed)
Subjective:  77 yo male, With past medical history of atrial fibrillation, chronic obstructive pulmonary disease, and bronchiectasis. He is followed by Dr. Craige Cotta in the outpatient setting. He was admitted by internal medicine on 09-06-2011 to the step down unit from skilled nursing facility with dyspnea, productive cough, and gram-positive cocci in his sputum. On 11-6 he developed increased shortness of breath, worsening aeration on chest film, and acute respiratory failure. He was intubated, placed on the mechanical ventilator, and broad-spectrum antibiotics were continued. Further diagnostic evaluation was also consistent with what appeared to be non-ST elevation MI, resulting in pulmonary edema superimposed on pneumonia. He was treated initially with sedation, mechanical ventilation, and broad-spectrum and empiric antibiotics. He was successfully weaned and later extubated on 09-10-2011. He subsequently underwent a swallow eval and MBS and placed on a dysphagia diet with thickened liquids with the suspicion that he was aspirating.   The patient is sitting up in bed without any complaints. He states he is eating well and bowels are functioning well. He is not short of breath and does not have a cough. He expresses that would like to go home soon.    Objective: Weight change: -4.3 kg (-9 lb 7.7 oz)  Intake/Output Summary (Last 24 hours) at 09/14/11 1613 Last data filed at 09/14/11 1500  Gross per 24 hour  Intake    927 ml  Output   3406 ml  Net  -2479 ml   Blood pressure 117/50, pulse 89, temperature 98.1 F (36.7 C), temperature source Oral, resp. rate 14, height 6\' 1"  (1.854 m), weight 71.5 kg (157 lb 10.1 oz), SpO2 100.00%. Temp:  [97.8 F (36.6 C)-98.3 F (36.8 C)] 98.1 F (36.7 C) (11/13 1200) Pulse Rate:  [64-89] 89  (11/13 1300) Resp:  [14-23] 14  (11/13 1500) BP: (103-133)/(44-70) 117/50 mmHg (11/13 1500) SpO2:  [98 %-100 %] 100 % (11/13 1300) Weight:  [71.5 kg (157 lb 10.1 oz)] 157  lb 10.1 oz (71.5 kg) (11/13 0500)  Physical Exam: General: No acute respiratory distress Lungs: Clear to auscultation bilaterally - no crackles Cardiovascular: Regular rate and rhythm. No murmurs/ gallops Abdomen: Nontender, nondistended, soft, bowel sounds positive, no rebound, no ascites, no appreciable mass Extremities: No significant cyanosis, clubbing, or edema bilateral lower extremities  Lab Results:  Basename 09/14/11 0500 09/13/11 1755 09/13/11 0450  NA 143 144 145  K 4.0 4.2 3.9  CL 101 102 109  CO2 33* 29 26  GLUCOSE 133* 175* 154*  BUN 42* 36* 38*  CREATININE 1.42* 1.14 1.15  CALCIUM 9.6 9.8 9.5  MG -- -- --  PHOS -- -- --   No results found for this basename: AST:2,ALT:2,ALKPHOS:2,BILITOT:2,PROT:2,ALBUMIN:2 in the last 72 hours No results found for this basename: LIPASE:2,AMYLASE:2 in the last 72 hours  Basename 09/14/11 0500 09/13/11 0450 09/12/11 0400  WBC 12.2* 10.3 8.7  NEUTROABS -- -- --  HGB 8.9* 8.9* 8.6*  HCT 27.5* 27.1* 26.0*  MCV 81.1 81.4 81.5  PLT 275 288 317   No results found for this basename: CKTOTAL:3,CKMB:3,CKMBINDEX:3,TROPONINI:3 in the last 72 hours No results found for this basename: POCBNP:3 in the last 72 hours No results found for this basename: DDIMER:2 in the last 72 hours No results found for this basename: HGBA1C:12 in the last 72 hours No results found for this basename: CHOL:2,HDL:2,LDLCALC:2,TRIG:2,CHOLHDL:2,LDLDIRECT:2 in the last 72 hours No results found for this basename: TSH,T4TOTAL,FREET3,T3FREE,THYROIDAB in the last 72 hours  Basename 09/14/11 0955  VITAMINB12 --  FOLATE --  FERRITIN --  TIBC --  IRON --  RETICCTPCT 3.0    Micro Results: Recent Results (from the past 240 hour(s))  CULTURE, BLOOD (ROUTINE X 2)     Status: Normal   Collection Time   09/06/11 11:31 AM      Component Value Range Status Comment   Specimen Description BLOOD LEFT ARM   Final    Special Requests BOTTLES DRAWN AEROBIC ONLY 10CC   Final     Setup Time [id number]   Final    Culture NO GROWTH 5 DAYS   Final    Report Status 09/12/2011 FINAL   Final   CULTURE, BLOOD (ROUTINE X 2)     Status: Normal   Collection Time   09/06/11 11:50 AM      Component Value Range Status Comment   Specimen Description BLOOD LEFT WRIST   Final    Special Requests BOTTLES DRAWN AEROBIC ONLY 10CC   Final    Setup Time [id number]   Final    Culture NO GROWTH 5 DAYS   Final    Report Status 09/12/2011 FINAL   Final   GRAM STAIN     Status: Normal   Collection Time   09/06/11 12:02 PM      Component Value Range Status Comment   Specimen Description SPUTUM   Final    Special Requests NONE   Final    Gram Stain     Final    Value: FEW WBC PRESENT,BOTH PMN AND MONONUCLEAR     ABUNDANT GRAM POSITIVE COCCI IN PAIRS IN CLUSTERS     ABUNDANT GRAM POSITIVE COCCI IN PAIRS IN CHAINS     RARE GRAM POSITIVE RODS     RARE GRAM NEGATIVE RODS   Report Status 09/06/2011 FINAL   Final   CULTURE, RESPIRATORY     Status: Normal   Collection Time   09/06/11 12:02 PM      Component Value Range Status Comment   Specimen Description SPUTUM   Final    Special Requests NONE   Final    Gram Stain     Final    Value: FEW WBC PRESENT,BOTH PMN AND MONONUCLEAR     ABUNDANT SQUAMOUS EPITHELIAL CELLS PRESENT     ABUNDANT GRAM POSITIVE COCCI IN PAIRS AND CHAINS     IN CLUSTERS RARE GRAM POSITIVE RODS     RARE GRAM NEGATIVE RODS   Culture NORMAL OROPHARYNGEAL FLORA   Final    Report Status 09/08/2011 FINAL   Final   URINE CULTURE     Status: Normal   Collection Time   09/06/11  2:03 PM      Component Value Range Status Comment   Specimen Description URINE, CLEAN CATCH   Final    Special Requests NONE   Final    Setup Time [id number]   Final    Colony Count NO GROWTH   Final    Culture NO GROWTH   Final    Report Status 09/07/2011 FINAL   Final   CULTURE, BLOOD (ROUTINE X 2)     Status: Normal   Collection Time   09/06/11  5:10 PM      Component Value Range  Status Comment   Specimen Description BLOOD RIGHT ARM   Final    Special Requests BOTTLES DRAWN AEROBIC AND ANAEROBIC 10CC EACH   Final    Setup Time [id number]   Final    Culture NO GROWTH 5 DAYS   Final    Report Status  09/12/2011 FINAL   Final   CULTURE, BLOOD (ROUTINE X 2)     Status: Normal   Collection Time   09/06/11  5:19 PM      Component Value Range Status Comment   Specimen Description BLOOD LEFT ARM   Final    Special Requests BOTTLES DRAWN AEROBIC AND ANAEROBIC [facility]   Final    Setup Time [id number]   Final    Culture NO GROWTH 5 DAYS   Final    Report Status 09/12/2011 FINAL   Final   MRSA PCR SCREENING     Status: Normal   Collection Time   09/06/11  5:33 PM      Component Value Range Status Comment   MRSA by PCR NEGATIVE  NEGATIVE  Final     Studies/Results: Scheduled Meds:    . aspirin EC  81 mg Oral Daily  . furosemide  40 mg Oral Daily  . ipratropium  0.5 mg Nebulization Q6H  . levalbuterol  0.63 mg Nebulization Q6H  . losartan  12.5 mg Oral Daily  . piperacillin-tazobactam (ZOSYN)  IV  3.375 g Intravenous Q8H  . potassium chloride  40 mEq Oral TID  . predniSONE  40 mg Oral QAC breakfast  . simvastatin  10 mg Oral q1800  . sodium chloride  10 mL Intravenous Q12H  . DISCONTD: furosemide  40 mg Intravenous TID   Continuous Infusions:    . sodium chloride 20 mL/hr at 09/14/11 1500  . heparin 14 mL/hr (09/14/11 1500)   PRN Meds:.acetaminophen, food thickener, morphine injection, ondansetron (ZOFRAN) IV, sodium chloride  Assessment/Plan:   Acute respiratory failure with hypoxia in setting Pneumonia (presumed aspiration) and subsequent heart failure Day 8 of antibioitics. On Zosyn.   Change nebulizations to PRN. Check O2 sat off of O2.  Diuretics stopped today by cardiology as creatinine was rising.    Aspiration Please see the speech and language pathology consultation. It appears clear the patient is still suffering with significant dysphagia  from his recent CVA. We will begin attempts to rehabilitation the patient's swallow and we'll modify his diet as per the MBS reccs at this time.  Resolved sepsis: This was treated in the usual fashion with IV antibiotics, goal-directed therapy,and critical care support. This is now resolved. Antibiotics have been narrowed.   NSTEMI: There's likely complicated his acute respiratory failure. He's now hemodynamically stable. He is on aspirin, and beta blockade. Cardiology is following. He is not a cardiac catheterization candidate. Further treatment will be for medical therapy.   Atrial fibrillation: currently regular rate. Controlled ventricular response, maintained on beta-blockade, also currently on heparin drip, but it is not felt that he is a long-term anticoagulation candidate. Please see detailed discussion as per Cardiology.  Acute CHF- systolic- EF 20-25% on ECHO done during this admission. Lasix discontinued today.   Adrenal insufficiency: this was treated with stress dose steroids.  Now on oral prednisone with taper over 2 week period.    Will order PT eval. It was ordered last week but I do not see where he was seen by them.  Can transfer out of step down tomorrow if still stable.       LOS: 8 days   [facility] 09/14/2011, 4:13 PM

## 2011-09-14 NOTE — Progress Notes (Signed)
ANTICOAGULATION CONSULT NOTE - Follow Up Consult  Pharmacy Consult for heparin Indication: atrial fibrillation  No Known Allergies  Patient Measurements: Height: 6\' 1"  (185.4 cm) Weight: 157 lb 10.1 oz (71.5 kg) IBW/kg (Calculated) : 79.9   Vital Signs: Temp: 98.1 F (36.7 C) (11/13 1200) Temp src: Oral (11/13 1200) BP: 129/59 mmHg (11/13 1200) Pulse Rate: 89  (11/13 1300)  Labs:  Basename 09/14/11 0500 09/13/11 1755 09/13/11 1500 09/13/11 0450 09/12/11 0400  HGB 8.9* -- -- 8.9* --  HCT 27.5* -- -- 27.1* 26.0*  PLT 275 -- -- 288 317  APTT -- -- -- -- --  LABPROT -- -- -- -- --  INR -- -- -- -- --  HEPARINUNFRC 0.45 -- 0.50 0.26* --  CREATININE 1.42* 1.14 -- 1.15 --  CKTOTAL -- -- -- -- --  CKMB -- -- -- -- --  TROPONINI -- -- -- -- --   Estimated Creatinine Clearance: 38.5 ml/min (by C-G formula based on Cr of 1.42).  Assessment: 77yo male with Afib and now at goal on heparin for the last 2 days at a rate of 1400 units/hour, cbc stable from 11/11. Will continue heparin and f/u long term coumadin plans.  Goal of Therapy:  Heparin level 0.3-0.7 units/ml   Plan:  Continue heparin at 1400 units (14 ml) per hour F/u heparin level 11/14 am F/u plans for long-term coumadin (CHADS2 = 6, but patient is a severe fall risk) F/u CBC 11/14  Yifan Auker, [place] R PharmD 09/14/2011,3:00 PM

## 2011-09-14 NOTE — Progress Notes (Signed)
TRANSFERRED FROM 2914 BY BED AWAKE AND ALERT.

## 2011-09-14 NOTE — Progress Notes (Signed)
Clinical Social worker completed patient psychosocial assessment, see in patient shadow chart.  Patient lives with patient wife, with support from patient sons. Patient is open to short term rehab at the skilled nursing level when medically stable in [facility] preferring [facility]. Patient wife is also in agreement of patient discharging to short term rehab. Clinical social worker initiated Skilled Nursing placement, see placement note in patient shadow chart. Clinical social worker to follow up with patient with bed offers and will continue to assist with patient discharge needs.    Catha Gosselin, [place]   [phone]   11/13/012  16:45

## 2011-09-15 LAB — CBC
Platelets: 319 10*3/uL (ref 150–400)
RBC: 3.56 MIL/uL — ABNORMAL LOW (ref 4.22–5.81)
WBC: 13.7 10*3/uL — ABNORMAL HIGH (ref 4.0–10.5)

## 2011-09-15 LAB — BASIC METABOLIC PANEL
CO2: 29 mEq/L (ref 19–32)
Chloride: 103 mEq/L (ref 96–112)
GFR calc Af Amer: 61 mL/min — ABNORMAL LOW (ref >90)
Potassium: 4.6 mEq/L (ref 3.5–5.1)
Sodium: 141 mEq/L (ref 135–145)

## 2011-09-15 LAB — HEPARIN LEVEL (UNFRACTIONATED): Heparin Unfractionated: 0.4 IU/mL (ref 0.30–0.70)

## 2011-09-15 MED ORDER — ENOXAPARIN SODIUM 80 MG/0.8ML ~~LOC~~ SOLN: 75.0000 mg | Freq: Once | SUBCUTANEOUS | Status: DC

## 2011-09-15 MED ORDER — POTASSIUM CHLORIDE CRYS ER 20 MEQ PO TBCR
20.0000 meq | EXTENDED_RELEASE_TABLET | Freq: Two times a day (BID) | ORAL | Status: DC
  Administered 2011-09-15 – 2011-09-20 (×10): 20 meq via ORAL
  Filled 2011-09-15 (×10): qty 1

## 2011-09-15 MED ORDER — ENOXAPARIN SODIUM 80 MG/0.8ML ~~LOC~~ SOLN
75.0000 mg | SUBCUTANEOUS | Status: AC
  Administered 2011-09-15: 14:00:00 via SUBCUTANEOUS
  Filled 2011-09-15: qty 0.8

## 2011-09-15 MED ORDER — ENOXAPARIN SODIUM 80 MG/0.8ML ~~LOC~~ SOLN
75.0000 mg | Freq: Two times a day (BID) | SUBCUTANEOUS | Status: DC
  Administered 2011-09-16 – 2011-09-17 (×3): 75 mg via SUBCUTANEOUS
  Filled 2011-09-15 (×4): qty 0.8

## 2011-09-15 MED ORDER — PREDNISONE 20 MG PO TABS
30.0000 mg | ORAL_TABLET | Freq: Every day | ORAL | Status: DC
  Administered 2011-09-16 – 2011-09-20 (×5): 30 mg via ORAL
  Filled 2011-09-15 (×6): qty 1

## 2011-09-15 MED ORDER — OXYCODONE HCL 5 MG PO TABS: 5.0000 mg | ORAL_TABLET | ORAL | Status: DC | PRN

## 2011-09-15 NOTE — Progress Notes (Signed)
Physical Therapy Treatment Patient Details Name: Todd [name] MRN: [id number] DOB: 04-14-1924 Today's Date: 09/15/2011  PT Assessment/Plan  PT - Assessment/Plan Comments on Treatment Session: Pt tolerated increased ambulation, still limited by lines and leads, as well as fatigue.  Pt reports "I did not realize I was so weak" while ambulating.  2 person hand-held assist was used for ambulation instead of RW, due to limited space and length of lines and leads. PT Plan: Discharge plan remains appropriate;Frequency remains appropriate PT Frequency: Min 3X/week Follow Up Recommendations: Home health PT;24 hour supervision/assistance Equipment Recommended: Defer to next venue PT Goals  Acute Rehab PT Goals PT Goal: Supine/Side to Sit - Progress: Progressing toward goal PT Transfer Goal: Sit to Stand/Stand to Sit - Progress: Progressing toward goal PT Goal: Ambulate - Progress: Progressing toward goal PT Goal: Up/Down Stairs - Progress: Other (comment) (Not assessed)  PT Treatment Precautions/Restrictions  Precautions Precautions: Fall Required Braces or Orthoses: No Restrictions Weight Bearing Restrictions: No Mobility (including Balance) Bed Mobility Bed Mobility: Yes Supine to Sit: 5: Supervision Supine to Sit Details (indicate cue type and reason): Supervision for pt safety.  Verbal cues to initiate LE movement. Sitting - Scoot to Edge of Bed: 5: Supervision Sitting - Scoot to Edge of Bed Details (indicate cue type and reason): Supervision for pt safety.  Verbal cues to initiate scoot to EOB. Transfers Transfers: Yes Sit to Stand: 4: Min assist;With upper extremity assist;From bed Sit to Stand Details (indicate cue type and reason): Min A to initiate movement and for balance.   Stand to Sit: 4: Min assist;With upper extremity assist;To chair/3-in-1;With armrests Stand to Sit Details: Min A for eccentric control.  Verbal cues to reach for armrests before sitting.  A to organize  lines and leads. Ambulation/Gait Ambulation/Gait: Yes Ambulation/Gait Assistance: 1: +2 Total assist;Patient percentage (comment) (80) Ambulation/Gait Assistance Details (indicate cue type and reason): Total A +2 (pt 80%) for hand-held A.  A to maintain balance and organize lines and leads. Ambulation Distance (Feet): 10 Feet (Distance was limited due to lines, leads, and pt fatigue.) Assistive device: 2 person hand held assist Gait Pattern: Shuffle;Trunk flexed;Decreased step length - right;Decreased step length - left Gait velocity: Decreased, but speed not measured due to limited ambulation distance Stairs: No Wheelchair Mobility Wheelchair Mobility: No  Posture/Postural Control Posture/Postural Control: Postural limitations Postural Limitations: Forward flexed trunk Balance Balance Assessed: No Exercise    End of Session PT - End of Session Equipment Utilized During Treatment: Gait belt;Other (comment) (O2) Activity Tolerance: Patient limited by fatigue Patient left: in chair;with call bell in reach;Other (comment) (with respiratory therapist present) General Behavior During Session: [facility] for tasks performed Cognition: [facility] for tasks performed  Lacinda Axon 09/15/2011, 8:55 AM  Morell Mears, [place], PT  [phone]

## 2011-09-15 NOTE — Progress Notes (Signed)
Pt c/o SOB. RT gave Xopenex PRN treatment along with scheduled Atrovent Q6. RT will continue to monitor.

## 2011-09-15 NOTE — Progress Notes (Signed)
  Subjective:  Denies chest pain. He does not recall if he walked any yesterday or not.Rhythm stable atrial fib.  Objective:  Vital Signs in the last 24 hours: Temp:  [97.4 F (36.3 C)-98.1 F (36.7 C)] 97.4 F (36.3 C) (11/13 2340) Pulse Rate:  [28-146] 68  (11/14 0700) Resp:  [14-26] 22  (11/14 0700) BP: (103-137)/(44-98) 132/65 mmHg (11/14 0700) SpO2:  [91 %-100 %] 100 % (11/14 0700) Weight:  [166 lb 14.2 oz (75.7 kg)] 166 lb 14.2 oz (75.7 kg) (11/14 0500)  Intake/Output from previous day: 11/13 0701 - 11/14 0700 In: 1342 [P.O.:830; I.V.:462; IV Piggyback:50] Out: 1351 [Urine:1350; Stool:1] Intake/Output from this shift:       . aspirin EC  81 mg Oral Daily  . furosemide  40 mg Oral Daily  . ipratropium  0.5 mg Nebulization Q6H  . losartan  12.5 mg Oral Daily  . piperacillin-tazobactam (ZOSYN)  IV  3.375 g Intravenous Q8H  . potassium chloride  40 mEq Oral TID  . predniSONE  40 mg Oral QAC breakfast  . simvastatin  10 mg Oral q1800  . sodium chloride  10 mL Intravenous Q12H  . DISCONTD: furosemide  40 mg Intravenous TID  . DISCONTD: levalbuterol  0.63 mg Nebulization Q6H      . sodium chloride 20 mL/hr at 09/15/11 0000  . heparin 14 mL/hr (09/15/11 0000)    Physical Exam: The patient appears to be in no distress.  Head and neck exam reveals that the pupils are equal and reactive.  The extraocular movements are full.  There is no scleral icterus.  Mouth and pharynx are benign.  No lymphadenopathy.  No carotid bruits.  The jugular venous pressure is normal.  Thyroid is not enlarged or tender.  Chest reveals no significant rales.  Expansion of the chest is symmetrical.  Heart reveals no abnormal lift or heave.  First and second heart sounds are normal.  There is no murmur gallop rub or click.  The abdomen is soft and nontender.  Bowel sounds are normoactive.  There is no hepatosplenomegaly or mass.  There are no abdominal bruits.  Extremities reveal no phlebitis  or edema.  Pedal pulses are good.  There is no cyanosis or clubbing.  Neurologic exam is normal strength and no lateralizing weakness.  No sensory deficits.  Integument reveals no rash  Lab Results:  Basename 09/15/11 0505 09/14/11 0500  WBC 13.7* 12.2*  HGB 9.4* 8.9*  PLT 319 275    Basename 09/15/11 0505 09/14/11 0500  NA 141 143  K 4.6 4.0  CL 103 101  CO2 29 33*  GLUCOSE 120* 133*  BUN 43* 42*  CREATININE 1.21 1.42*   No results found for this basename: TROPONINI:2,CK,MB:2 in the last 72 hours Hepatic Function Panel No results found for this basename: PROT,ALBUMIN,AST,ALT,ALKPHOS,BILITOT,BILIDIR,IBILI in the last 72 hours No results found for this basename: CHOL in the last 72 hours No results found for this basename: PROTIME in the last 72 hours  Imaging: Imaging results have been reviewed  Cardiac Studies:  Assessment/Plan:  Atrial fib with controlled ventricular response  Plan: Continue IV heparin in hospital.   No warfarin unless his strength and balance are adequate to prevent high fall risk.   LOS: 9 days    Cassell Clement 09/15/2011, 7:23 AM

## 2011-09-15 NOTE — Progress Notes (Signed)
ANTICOAGULATION CONSULT NOTE - Follow Up Consult  Pharmacy Consult for heparin Indication: atrial fibrillation  No Known Allergies  Patient Measurements: Height: 6\' 1"  (185.4 cm) Weight: 166 lb 14.2 oz (75.7 kg) IBW/kg (Calculated) : 79.9   Vital Signs: Temp: 97.8 F (36.6 C) (11/14 0800) Temp src: Oral (11/14 0800) BP: 132/65 mmHg (11/14 0700) Pulse Rate: 68  (11/14 0700)  Labs:  Basename 09/15/11 0505 09/14/11 0500 09/13/11 1755 09/13/11 1500 09/13/11 0450  HGB 9.4* 8.9* -- -- --  HCT 29.3* 27.5* -- -- 27.1*  PLT 319 275 -- -- 288  APTT -- -- -- -- --  LABPROT -- -- -- -- --  INR -- -- -- -- --  HEPARINUNFRC 0.40 0.45 -- 0.50 --  CREATININE 1.21 1.42* 1.14 -- --  CKTOTAL -- -- -- -- --  CKMB -- -- -- -- --  TROPONINI -- -- -- -- --   Estimated Creatinine Clearance: 47.8 ml/min (by C-G formula based on Cr of 1.21).  Assessment: Heparin has remained therapeutic. Likely not a coumadin candidate per MD. Cont IV heparin for afib. No complication noted.  Goal of Therapy:  Heparin level 0.3-0.7 units/ml   Plan:  Continue heparin at 1400 units (14 ml) per hour   Clide Cliff PharmD 09/15/2011,9:57 AM

## 2011-09-15 NOTE — Progress Notes (Signed)
Speech Language/Pathology   Speech Pathology: Dysphagia Treatment Note  Patient was observed with: Nectar thick liquids.  Patient was noted to have no s/s of aspiration Lung Sounds:  Diminished, but clear, per RN Temperature: 97.8  Patient required: Minimal cues to consistently follow precautions/strategies  Other: Patient was unable to state why he needed to be on thickened liquids, but said "It's ok with me.  I can go that route,"  When re-educated to aspiration of thin liquids with risk of recurrent pneumonia.  Patient reports he does not mind using the thickener.  Recommendations:  Continue Dysphagia 3 diet with Nectar thick liquids after d/c, as this appears to be a chronic issue.  Pain:  0    Goals: Patient will tolerate Dysphagia 3 diet without s/s of aspiration. Patient will verbalize need for Nectar thick liquids, and rationale.   Maryjo Rochester, M.Ed., CCC/SLP

## 2011-09-15 NOTE — Progress Notes (Signed)
Subjective:  77 yo male, With past medical history of atrial fibrillation, chronic obstructive pulmonary disease, and bronchiectasis. He is followed by Dr. Craige Cotta in the outpatient setting. He was admitted by internal medicine on 09-06-2011 to the step down unit from skilled nursing facility with dyspnea, productive cough, and gram-positive cocci in his sputum. On 11-6 he developed increased shortness of breath, worsening aeration on chest film, and acute respiratory failure. He was intubated, placed on the mechanical ventilator, and broad-spectrum antibiotics were continued. Further diagnostic evaluation was also consistent with what appeared to be non-ST elevation MI, resulting in pulmonary edema superimposed on pneumonia. He was treated initially with sedation, mechanical ventilation, and broad-spectrum and empiric antibiotics. He was successfully weaned and later extubated on 09-10-2011. He subsequently underwent a swallow eval and MBS and placed on a dysphagia diet with thickened liquids with the suspicion that he was aspirating.   The patient continues to improve. He denies fevers chills nausea vomiting or chest pain. He is sitting comfortably in a chair at the bedside with no active complaints. He reports that he feels "severely weak" with all attempts to ambulate. He still has a right neck central venous line. He is also on a heparin drip.   Objective: Weight change: 4.2 kg (9 lb 4.2 oz)  Intake/Output Summary (Last 24 hours) at 09/15/11 1324 Last data filed at 09/15/11 1300  Gross per 24 hour  Intake   1268 ml  Output   2176 ml  Net   -908 ml   Blood pressure 111/56, pulse 82, temperature 97.9 F (36.6 C), temperature source Oral, resp. rate 23, height 6\' 1"  (1.854 m), weight 75.7 kg (166 lb 14.2 oz), SpO2 100.00%. Temp:  [97.4 F (36.3 C)-97.9 F (36.6 C)] 97.9 F (36.6 C) (11/14 1148) Pulse Rate:  [28-146] 82  (11/14 1300) Resp:  [14-25] 23  (11/14 1300) BP: (110-137)/(46-98) 111/56  mmHg (11/14 1200) SpO2:  [91 %-100 %] 100 % (11/14 1300) FiO2 (%):  [2 %] 2 % (11/14 1200) Weight:  [75.7 kg (166 lb 14.2 oz)] 166 lb 14.2 oz (75.7 kg) (11/14 0500)  Physical Exam: General: No acute respiratory distress at the present time Lungs: Clear to auscultation bilaterally - no crackles or wheezes Cardiovascular: Regular rate. No murmurs/ gallops Abdomen: Nontender, nondistended, soft, bowel sounds positive, no rebound, no ascites, no appreciable mass Extremities: No significant cyanosis, clubbing, or edema bilateral lower extremities  Lab Results:  Basename 09/15/11 0505 09/14/11 0500 09/13/11 1755  NA 141 143 144  K 4.6 4.0 4.2  CL 103 101 102  CO2 29 33* 29  GLUCOSE 120* 133* 175*  BUN 43* 42* 36*  CREATININE 1.21 1.42* 1.14  CALCIUM 9.5 9.6 9.8  MG -- -- --  PHOS -- -- --   No results found for this basename: AST:2,ALT:2,ALKPHOS:2,BILITOT:2,PROT:2,ALBUMIN:2 in the last 72 hours No results found for this basename: LIPASE:2,AMYLASE:2 in the last 72 hours  Basename 09/15/11 0505 09/14/11 0500 09/13/11 0450  WBC 13.7* 12.2* 10.3  NEUTROABS -- -- --  HGB 9.4* 8.9* 8.9*  HCT 29.3* 27.5* 27.1*  MCV 82.3 81.1 81.4  PLT 319 275 288   No results found for this basename: CKTOTAL:3,CKMB:3,CKMBINDEX:3,TROPONINI:3 in the last 72 hours No results found for this basename: POCBNP:3 in the last 72 hours No results found for this basename: DDIMER:2 in the last 72 hours No results found for this basename: HGBA1C:12 in the last 72 hours No results found for this basename: CHOL:2,HDL:2,LDLCALC:2,TRIG:2,CHOLHDL:2,LDLDIRECT:2 in the last 72  hours No results found for this basename: TSH,T4TOTAL,FREET3,T3FREE,THYROIDAB in the last 72 hours  Basename 09/14/11 0955  VITAMINB12 476  FOLATE 11.1  FERRITIN 39  TIBC 338  IRON 16*  RETICCTPCT 3.0    Micro Results: Recent Results (from the past 240 hour(s))  CULTURE, BLOOD (ROUTINE X 2)     Status: Normal   Collection Time   09/06/11  11:31 AM      Component Value Range Status Comment   Specimen Description BLOOD LEFT ARM   Final    Special Requests BOTTLES DRAWN AEROBIC ONLY 10CC   Final    Setup Time [id number]   Final    Culture NO GROWTH 5 DAYS   Final    Report Status 09/12/2011 FINAL   Final   CULTURE, BLOOD (ROUTINE X 2)     Status: Normal   Collection Time   09/06/11 11:50 AM      Component Value Range Status Comment   Specimen Description BLOOD LEFT WRIST   Final    Special Requests BOTTLES DRAWN AEROBIC ONLY 10CC   Final    Setup Time [id number]   Final    Culture NO GROWTH 5 DAYS   Final    Report Status 09/12/2011 FINAL   Final   GRAM STAIN     Status: Normal   Collection Time   09/06/11 12:02 PM      Component Value Range Status Comment   Specimen Description SPUTUM   Final    Special Requests NONE   Final    Gram Stain     Final    Value: FEW WBC PRESENT,BOTH PMN AND MONONUCLEAR     ABUNDANT GRAM POSITIVE COCCI IN PAIRS IN CLUSTERS     ABUNDANT GRAM POSITIVE COCCI IN PAIRS IN CHAINS     RARE GRAM POSITIVE RODS     RARE GRAM NEGATIVE RODS   Report Status 09/06/2011 FINAL   Final   CULTURE, RESPIRATORY     Status: Normal   Collection Time   09/06/11 12:02 PM      Component Value Range Status Comment   Specimen Description SPUTUM   Final    Special Requests NONE   Final    Gram Stain     Final    Value: FEW WBC PRESENT,BOTH PMN AND MONONUCLEAR     ABUNDANT SQUAMOUS EPITHELIAL CELLS PRESENT     ABUNDANT GRAM POSITIVE COCCI IN PAIRS AND CHAINS     IN CLUSTERS RARE GRAM POSITIVE RODS     RARE GRAM NEGATIVE RODS   Culture NORMAL OROPHARYNGEAL FLORA   Final    Report Status 09/08/2011 FINAL   Final   URINE CULTURE     Status: Normal   Collection Time   09/06/11  2:03 PM      Component Value Range Status Comment   Specimen Description URINE, CLEAN CATCH   Final    Special Requests NONE   Final    Setup Time [id number]   Final    Colony Count NO GROWTH   Final    Culture NO GROWTH   Final      Report Status 09/07/2011 FINAL   Final   CULTURE, BLOOD (ROUTINE X 2)     Status: Normal   Collection Time   09/06/11  5:10 PM      Component Value Range Status Comment   Specimen Description BLOOD RIGHT ARM   Final    Special Requests BOTTLES DRAWN AEROBIC AND ANAEROBIC 10CC EACH  Final    Setup Time [id number]   Final    Culture NO GROWTH 5 DAYS   Final    Report Status 09/12/2011 FINAL   Final   CULTURE, BLOOD (ROUTINE X 2)     Status: Normal   Collection Time   09/06/11  5:19 PM      Component Value Range Status Comment   Specimen Description BLOOD LEFT ARM   Final    Special Requests BOTTLES DRAWN AEROBIC AND ANAEROBIC [facility]   Final    Setup Time [id number]   Final    Culture NO GROWTH 5 DAYS   Final    Report Status 09/12/2011 FINAL   Final   MRSA PCR SCREENING     Status: Normal   Collection Time   09/06/11  5:33 PM      Component Value Range Status Comment   MRSA by PCR NEGATIVE  NEGATIVE  Final     Studies/Results: Scheduled Meds:    . aspirin EC  81 mg Oral Daily  . furosemide  40 mg Oral Daily  . ipratropium  0.5 mg Nebulization Q6H  . losartan  12.5 mg Oral Daily  . piperacillin-tazobactam (ZOSYN)  IV  3.375 g Intravenous Q8H  . potassium chloride  40 mEq Oral TID  . predniSONE  40 mg Oral QAC breakfast  . simvastatin  10 mg Oral q1800  . sodium chloride  10 mL Intravenous Q12H  . DISCONTD: levalbuterol  0.63 mg Nebulization Q6H   Continuous Infusions:    . sodium chloride 20 mL/hr at 09/15/11 0000  . heparin 1,400 Units (09/15/11 1300)   PRN Meds:.acetaminophen, food thickener, levalbuterol, morphine injection, ondansetron (ZOFRAN) IV, sodium chloride  Assessment/Plan:   Acute respiratory failure with hypoxia in setting Pneumonia (presumed aspiration) and subsequent heart failure We will now discontinue antibiotic therapy and follow the patient clinically.    Aspiration Please see the speech and language pathology consultation. It appears  clear the patient is still suffering with significant dysphagia from his recent CVA. We will continue attempts to rehabilitation the patient's swallow and we have modified his diet as per the MBS reccs.  Resolved sepsis: This was treated in the usual fashion with IV antibiotics, goal-directed therapy,and critical care support. This is now resolved. Antibiotics have been discontinued.   NSTEMI: There's likely complicated his acute respiratory failure. He's now hemodynamically stable. He is on aspirin, and beta blockade. Cardiology is following. He is not a cardiac catheterization candidate. Further treatment will be medical therapy.   Atrial fibrillation: currently regular rate. Controlled ventricular response, maintained on beta-blockade, also currently on heparin drip, but it is not felt that he is a long-term anticoagulation candidate. Please see detailed discussion as per Cardiology. I will change him to full dose Lovenox to facilitate his physical therapy.  Acute CHF- systolic- EF 20-25% on ECHO done during this admission. Lasix being titrated by cardiology.   Adrenal insufficiency: this was treated with stress dose steroids. Now on oral prednisone with taper over 2 week period.   Disposition: The patient continues to make slow improvement. He remains severely deconditioned. We'll transition him to a telemetry bed. We'll continue physical therapy and occupational therapy. The ultimate disposition is for him to be discharged to a skilled nursing facility for rehabilitation stay.    LOS: 9 days   Cynda Soule T 09/15/2011, 1:24 PM

## 2011-09-15 NOTE — Progress Notes (Signed)
ANTICOAGULATION CONSULT NOTE - Follow Up Consult  Pharmacy Consult for lovenox Indication: chest pain/ACS and atrial fibrillation  No Known Allergies  Patient Measurements: Height: 6\' 1"  (185.4 cm) Weight: 166 lb 14.2 oz (75.7 kg) IBW/kg (Calculated) : 79.9  Adjusted Body Weight:   Vital Signs: Temp: 97.9 F (36.6 C) (11/14 1148) Temp src: Oral (11/14 1148) BP: 111/56 mmHg (11/14 1200) Pulse Rate: 82  (11/14 1300)  Labs:  Basename 09/15/11 0505 09/14/11 0500 09/13/11 1755 09/13/11 1500 09/13/11 0450  HGB 9.4* 8.9* -- -- --  HCT 29.3* 27.5* -- -- 27.1*  PLT 319 275 -- -- 288  APTT -- -- -- -- --  LABPROT -- -- -- -- --  INR -- -- -- -- --  HEPARINUNFRC 0.40 0.45 -- 0.50 --  CREATININE 1.21 1.42* 1.14 -- --  CKTOTAL -- -- -- -- --  CKMB -- -- -- -- --  TROPONINI -- -- -- -- --   Estimated Creatinine Clearance: 47.8 ml/min (by C-G formula based on Cr of 1.21).   Medications:  Scheduled:    . aspirin EC  81 mg Oral Daily  . enoxaparin (LOVENOX) injection  75 mg Subcutaneous NOW  . enoxaparin (LOVENOX) injection  75 mg Subcutaneous Once  . enoxaparin (LOVENOX) injection  75 mg Subcutaneous Q12H  . furosemide  40 mg Oral Daily  . ipratropium  0.5 mg Nebulization Q6H  . losartan  12.5 mg Oral Daily  . potassium chloride  20 mEq Oral BID  . predniSONE  30 mg Oral QAC breakfast  . simvastatin  10 mg Oral q1800  . sodium chloride  10 mL Intravenous Q12H  . DISCONTD: levalbuterol  0.63 mg Nebulization Q6H  . DISCONTD: piperacillin-tazobactam (ZOSYN)  IV  3.375 g Intravenous Q8H  . DISCONTD: potassium chloride  40 mEq Oral TID  . DISCONTD: predniSONE  40 mg Oral QAC breakfast   Infusions:    . sodium chloride 20 mL/hr at 09/15/11 0000  . DISCONTD: heparin 1,400 Units (09/15/11 1300)    Assessment: 77 yo with afib/CP who is being transition from IV heparin to lovenox. Will start full dose lovenox for anticoagulation. Goal of Therapy:  Heparin level = 0.6-1.2     Plan:  Lovenox 75mg  SQ Q12 CBC q3days  Todd [name] [place] 09/15/2011,1:47 PM

## 2011-09-16 LAB — CBC
HCT: 31.2 % — ABNORMAL LOW (ref 39.0–52.0)
Hemoglobin: 9.8 g/dL — ABNORMAL LOW (ref 13.0–17.0)
WBC: 15.5 10*3/uL — ABNORMAL HIGH (ref 4.0–10.5)

## 2011-09-16 MED ORDER — DIAZEPAM 2 MG PO TABS
2.0000 mg | ORAL_TABLET | Freq: Two times a day (BID) | ORAL | Status: DC | PRN
  Administered 2011-09-16: 2 mg via ORAL
  Filled 2011-09-16: qty 1

## 2011-09-16 MED ORDER — DIPHENHYDRAMINE HCL 50 MG/ML IJ SOLN
25.0000 mg | Freq: Once | INTRAMUSCULAR | Status: AC
  Administered 2011-09-16: 25 mg via INTRAVENOUS
  Filled 2011-09-16: qty 1

## 2011-09-16 NOTE — Progress Notes (Signed)
CSW received patient from [facility] 8. CSW met with patient and explained transfer and introduced myself. CSW confirmed plan that patient wanted to return to [facility] at dc since he had been there in the past. CSW will continue to follow to assist with dc needs.

## 2011-09-16 NOTE — Progress Notes (Signed)
Report was called to RN on 2000 and patient transferred to room 2036 by RN and nurse tech on portable heart monitor.  [name] [facility] 09/16/2011 10:51 PM

## 2011-09-16 NOTE — Progress Notes (Signed)
Utilization Review Completed.Todd [name] T11/15/2012   

## 2011-09-16 NOTE — Progress Notes (Signed)
Subjective: Patient seen.Denies any chest pain or sob.Said to have complained about insommia   Objective: Weight change: -3.8 kg (-8 lb 6 oz)  Intake/Output Summary (Last 24 hours) at 09/16/11 1531 Last data filed at 09/16/11 1500  Gross per 24 hour  Intake    960 ml  Output    976 ml  Net    -16 ml   BP 128/44  Pulse 83  Temp(Src) 98 F (36.7 C) (Oral)  Resp 25  Ht 6\' 1"  (1.854 m)  Wt 71.9 kg (158 lb 8.2 oz)  BMI 20.91 kg/m2  SpO2 100% Physical Exam: General appearance: alert, cooperative and no distress Head: Normocephalic, without obvious abnormality, atraumatic Neck: no adenopathy, no carotid bruit, no JVD, supple, symmetrical, trachea midline and thyroid not enlarged, symmetric, no tenderness/mass/nodules Lungs: Decrease breath sounds bibasally Heart: regular rate and rhythm, S1, S2 normal, no murmur, click, rub or gallop Abdomen: soft, non-tender; bowel sounds normal; no masses,  no organomegaly Extremities: extremities normal, atraumatic, no cyanosis or edema Skin: Skin color, texture, turgor normal. No rashes or lesions  Lab Results: @labtest @  Micro Results: Recent Results (from the past 240 hour(s))  CULTURE, BLOOD (ROUTINE X 2)     Status: Normal   Collection Time   09/06/11  5:10 PM      Component Value Range Status Comment   Specimen Description BLOOD RIGHT ARM   Final    Special Requests BOTTLES DRAWN AEROBIC AND ANAEROBIC 10CC EACH   Final    Setup Time [id number]   Final    Culture NO GROWTH 5 DAYS   Final    Report Status 09/12/2011 FINAL   Final   CULTURE, BLOOD (ROUTINE X 2)     Status: Normal   Collection Time   09/06/11  5:19 PM      Component Value Range Status Comment   Specimen Description BLOOD LEFT ARM   Final    Special Requests BOTTLES DRAWN AEROBIC AND ANAEROBIC [facility]   Final    Setup Time [id number]   Final    Culture NO GROWTH 5 DAYS   Final    Report Status 09/12/2011 FINAL   Final   MRSA PCR SCREENING     Status: Normal     Collection Time   09/06/11  5:33 PM      Component Value Range Status Comment   MRSA by PCR NEGATIVE  NEGATIVE  Final     Studies/Results: Dg Chest Portable 1 View  09/12/2011  *RADIOLOGY REPORT*  Clinical Data: Shortness of breath post extubation  PORTABLE CHEST - 1 VIEW  Comparison: 09/11/2011; 09/10/2011; 09/09/2011  Findings:  Unchanged cardiac silhouette and mediastinal contours with persistent obscuration of the right heart border secondary to consolidative opacity.  Interval increase in left-sided retrocardiac consolidative opacities.  Stable position of support apparatus.  No definite pneumothorax.  Blunting of bilateral costophrenic angles may suggest small bilateral effusions. Unchanged increased interstitial markings within the right upper lung.  Unchanged bones.  IMPRESSION: 1.  No change to minimal increase in left basilar consolidative opacities with findings of partial atelectasis/collapse of the bilateral lower lungs. 2.  Unchanged heterogeneous opacities within the right upper lung, possibly infectious in etiology, though asymmetric pulmonary edema may have a similar appearance.  Original Report Authenticated By: Waynard Reeds, M.D.   Dg Chest Portable 1 View  09/11/2011  *RADIOLOGY REPORT*  Clinical Data: Check ET tube placement  PORTABLE CHEST - 1 VIEW  Comparison: 09/10/2011  Findings:  The endotracheal tube has been removed.  There is a right IJ catheter with tip in the SVC.  Pleural effusions and interstitial edema are unchanged from previous exam.  Right upper lobe and bibasilar opacities are stable.  IMPRESSION:  1.  No change in aeration to the lungs compared with previous exam. 2.  Status post extubation.  Original Report Authenticated By: Rosealee Albee, M.D.   Dg Chest Portable 1 View  09/10/2011  *RADIOLOGY REPORT*  Clinical Data: Evaluate ET tube placement  PORTABLE CHEST - 1 VIEW  Comparison: 09/09/2011  Findings: The endotracheal tube tip is above the carina.   Right IJ catheter tip is in the SVC.  The heart size is mildly enlarged.  Persistent low lung volumes with small effusions and mild edema. There has been improvement in right upper lobe airspace opacity.  IMPRESSION:  1.  Improvement in aeration to the right upper lobe. 2.  Persistent low lung volumes and small effusions/edema.  Original Report Authenticated By: Rosealee Albee, M.D.   Dg Chest Portable 1 View  09/09/2011  *RADIOLOGY REPORT*  Clinical Data: Pneumonia, shortness of breath.  PORTABLE CHEST - 1 VIEW  Comparison: 09/08/2011  Findings: Interval placement NG tube in the stomach.  Support devices otherwise stable.  Patchy bilateral airspace disease, most pronounced in the right upper lobe and lung bases has increased slightly since prior study.  Mild cardiomegaly.  No definite effusions.  IMPRESSION: Increasing patchy bilateral airspace disease.  Original Report Authenticated By: Cyndie Chime, M.D.   Dg Chest Portable 1 View  09/08/2011  *RADIOLOGY REPORT*  Clinical Data: Post right IJ line placement  PORTABLE CHEST - 1 VIEW  Comparison: 08/08/2011; 09/06/2011  Findings: Grossly unchanged cardiac silhouette and mediastinal contour.  Interval intubation with endotracheal tube overlying tracheal air column with tip supra to the carina.  Interval placement of right jugular approach into venous catheter with tip overlying the superior cavoatrial junction.  No pneumothorax.  No change to minimal improved aeration of the right upper lung with persistent right upper lung heterogeneous opacities. Grossly unchanged left perihilar and basilar heterogeneous opacities, possibly atelectasis.  No new focal airspace opacities.  No definite pleural effusion.  Grossly unchanged bones.  IMPRESSION:     1.    Appropriately positioned support apparatus as above.  No       pneumothorax.        2.    No change to minimally improved aeration of the right upper lung with persistent heterogeneous opacities worrisome for  infection. Original Report Authenticated By: Waynard Reeds, M.D.   Dg Chest Portable 1 View  09/08/2011  *RADIOLOGY REPORT*  Clinical Data: Shortness of breath, pneumonia.  PORTABLE CHEST - 1 VIEW  Comparison: 09/06/2011  Findings: Patchy airspace disease in the right upper lobe compatible with pneumonia.  This is increased since prior study. Continued patchy opacity in the left upper lobe.  Heart is enlarged.  No effusions.  IMPRESSION: Worsening consolidation in the right upper lobe compatible with worsening pneumonia.  Stable patchy left upper lobe opacity.  Original Report Authenticated By: Cyndie Chime, M.D.   Dg Chest Portable 1 View  09/06/2011  *RADIOLOGY REPORT*  Clinical Data: Shortness of breath, chest pain, some difficulty breathing  PORTABLE CHEST - 1 VIEW  Comparison: Portable chest x-ray of 06/18/2011  Findings: There is poorly defined airspace disease right greater than left.  Pneumonia cannot be excluded with edema less likely in view of the asymmetry and peripheral  distribution.  Cardiomegaly is stable.  No effusion is seen.  IMPRESSION: Patchy airspace disease bilaterally.  Favor multifocal pneumonia. Recommend follow-up two-view chest x-ray  Original Report Authenticated By: Juline Patch, M.D.   Dg Swallowing Func-no Report  09/13/2011  CLINICAL DATA: r/o aspiration   FLUOROSCOPY FOR SWALLOWING FUNCTION STUDY:  Fluoroscopy was provided for swallowing function study, which was  administered by a speech pathologist.  Final results and recommendations  from this study are contained within the speech pathology report.     Medications: Scheduled Meds:   . aspirin EC  81 mg Oral Daily  . diphenhydrAMINE  25 mg Intravenous Once  . enoxaparin (LOVENOX) injection  75 mg Subcutaneous Once  . enoxaparin (LOVENOX) injection  75 mg Subcutaneous Q12H  . furosemide  40 mg Oral Daily  . ipratropium  0.5 mg Nebulization Q6H  . losartan  12.5 mg Oral Daily  . potassium chloride  20 mEq  Oral BID  . predniSONE  30 mg Oral QAC breakfast  . simvastatin  10 mg Oral q1800  . sodium chloride  10 mL Intravenous Q12H   Continuous Infusions:   . sodium chloride 10 mL/hr at 09/16/11 1500   PRN Meds:.acetaminophen, food thickener, levalbuterol, morphine injection, ondansetron (ZOFRAN) IV, oxyCODONE, sodium chloride  Assessment/Plan: Vent dependent respiratory failure s/p extubation-pt tolerating atmospheric oxygen Pneumonia-Resolving Sepsis-Resolving NSTEMI-will continue medical management Atrial fibrillation-ventricular rate controlled Copd-will continue breathing treatment CHF(systolic dysfunction)-continue diuresis Deconditioning-will consult physical therapy   LOS: 10 days   Todd [name] 09/16/2011, 3:31 PM

## 2011-09-17 ENCOUNTER — Inpatient Hospital Stay (HOSPITAL_COMMUNITY): Payer: Medicare Other

## 2011-09-17 LAB — COMPREHENSIVE METABOLIC PANEL
ALT: 16 U/L (ref 0–53)
AST: 13 U/L (ref 0–37)
CO2: 28 mEq/L (ref 19–32)
Calcium: 9.2 mg/dL (ref 8.4–10.5)
Chloride: 103 mEq/L (ref 96–112)
Creatinine, Ser: 0.94 mg/dL (ref 0.50–1.35)
GFR calc Af Amer: 86 mL/min — ABNORMAL LOW (ref >90)
GFR calc non Af Amer: 74 mL/min — ABNORMAL LOW (ref >90)
Glucose, Bld: 104 mg/dL — ABNORMAL HIGH (ref 70–99)
Sodium: 138 mEq/L (ref 135–145)
Total Bilirubin: 0.3 mg/dL (ref 0.3–1.2)

## 2011-09-17 LAB — CBC
Hemoglobin: 9.8 g/dL — ABNORMAL LOW (ref 13.0–17.0)
MCH: 26 pg (ref 26.0–34.0)
MCV: 81.4 fL (ref 78.0–100.0)
RBC: 3.77 MIL/uL — ABNORMAL LOW (ref 4.22–5.81)
WBC: 14 10*3/uL — ABNORMAL HIGH (ref 4.0–10.5)

## 2011-09-17 MED ORDER — ENOXAPARIN SODIUM 80 MG/0.8ML ~~LOC~~ SOLN
70.0000 mg | Freq: Two times a day (BID) | SUBCUTANEOUS | Status: DC
  Administered 2011-09-17: 80 mg via SUBCUTANEOUS
  Administered 2011-09-18 – 2011-09-19 (×3): 70 mg via SUBCUTANEOUS
  Filled 2011-09-17 (×5): qty 0.8

## 2011-09-17 NOTE — Progress Notes (Signed)
Per MD, patient not ready to dc today. CSW will continue to follow and will assist with any dc needs.

## 2011-09-17 NOTE — Consult Note (Signed)
Subjective:Denies SOB   NO CP Objective: Filed Vitals:   09/17/11 0209 09/17/11 0417 09/17/11 0806 09/17/11 1300  BP:  122/62  128/58  Pulse: 68 66  83  Temp:  97.7 F (36.5 C)  97.8 F (36.6 C)  TempSrc:  Oral  Oral  Resp: 20 18  18   Height:      Weight:  155 lb 11.2 oz (70.625 kg)    SpO2: 98% 99% 96% 98%   Weight change: -2 lb 13 oz (-1.275 kg)  Intake/Output Summary (Last 24 hours) at 09/17/11 1804 Last data filed at 09/17/11 1752  Gross per 24 hour  Intake   1230 ml  Output   2476 ml  Net  -1246 ml    General: Alert, awake, oriented x3, in no acute distress.  HEENT: No bruits, no goiter.  Heart: Regular rate and rhythm, without murmurs, rubs, gallops.  Lungs: Crackles left side, bilateral air movement.  Abdomen: Soft, nontender, nondistended, positive bowel sounds.  Neuro: Grossly intact, nonfocal.   Lab Results: Results for orders placed during the hospital encounter of 09/06/11 (from the past 24 hour(s))  CBC     Status: Abnormal   Collection Time   09/17/11  5:15 AM      Component Value Range   WBC 14.0 (*) 4.0 - 10.5 (K/uL)   RBC 3.77 (*) 4.22 - 5.81 (MIL/uL)   Hemoglobin 9.8 (*) 13.0 - 17.0 (g/dL)   HCT 45.4 (*) 09.8 - 52.0 (%)   MCV 81.4  78.0 - 100.0 (fL)   MCH 26.0  26.0 - 34.0 (pg)   MCHC 31.9  30.0 - 36.0 (g/dL)   RDW 11.9 (*) 14.7 - 15.5 (%)   Platelets 373  150 - 400 (K/uL)  COMPREHENSIVE METABOLIC PANEL     Status: Abnormal   Collection Time   09/17/11  5:15 AM      Component Value Range   Sodium 138  135 - 145 (mEq/L)   Potassium 4.2  3.5 - 5.1 (mEq/L)   Chloride 103  96 - 112 (mEq/L)   CO2 28  19 - 32 (mEq/L)   Glucose, Bld 104 (*) 70 - 99 (mg/dL)   BUN 40 (*) 6 - 23 (mg/dL)   Creatinine, Ser 8.29  0.50 - 1.35 (mg/dL)   Calcium 9.2  8.4 - 56.2 (mg/dL)   Total Protein 6.3  6.0 - 8.3 (g/dL)   Albumin 2.8 (*) 3.5 - 5.2 (g/dL)   AST 13  0 - 37 (U/L)   ALT 16  0 - 53 (U/L)   Alkaline Phosphatase 80  39 - 117 (U/L)   Total Bilirubin 0.3   0.3 - 1.2 (mg/dL)   GFR calc non Af Amer 74 (*) >90 (mL/min)   GFR calc Af Amer 86 (*) >90 (mL/min)    Studies/Results: No results found.  Medications: I have reviewed the patient's current medications.   Patient Active Hospital Problem List: Pneumonia (09/06/2011)   Assessment:   Plan:per primary team Sepsis (09/06/2011)   Assessment:    Plan: Chest pain (09/06/2011)   Assessment: S/P NSTEMI   Plan:Medical Rx. Atrial fibrillation (09/06/2011)   Assessment: rates cntrolled.   Plan: Continue current regimen.  Will make sure he has outpatient f/u Acute respiratory failure with hypoxia (09/06/2011)   Assessment:   Plan: 2   LOS: 11 days   Dietrich Pates 09/17/2011, 6:04 PM

## 2011-09-17 NOTE — Progress Notes (Signed)
Nutrition Follow Up  Extubated 11/9. BSE completed 11/10 recommending Dysphagia 2 diet with nectar thickened liquids. MBSS 11/12 recommending Dysphagia 3 diet with nectar thickened liquids. Pt's appetite has improved since initial placement on diet, now eating 70-100%. Pt denies any questions/concerns regarding diet or appetite.  Wt at 70.6kg. Down 4.3kg x 1 week. Likely fluid related.  Re-estimated needs: 1800 - 2000 kcal, 90-110g protein daily.  Labs:  BMET    Component Value Date/Time   NA 138 09/17/2011 0515   K 4.2 09/17/2011 0515   CL 103 09/17/2011 0515   CO2 28 09/17/2011 0515   GLUCOSE 104* 09/17/2011 0515   BUN 40* 09/17/2011 0515   CREATININE 0.94 09/17/2011 0515   CALCIUM 9.2 09/17/2011 0515   GFRNONAA 74* 09/17/2011 0515   GFRAA 86* 09/17/2011 0515   CBG: 142 - 119 - 230  Medications:    . aspirin EC  81 mg Oral Daily  . enoxaparin (LOVENOX) injection  75 mg Subcutaneous Once  . enoxaparin (LOVENOX) injection  75 mg Subcutaneous Q12H  . furosemide  40 mg Oral Daily  . ipratropium  0.5 mg Nebulization Q6H  . losartan  12.5 mg Oral Daily  . potassium chloride  20 mEq Oral BID  . predniSONE  30 mg Oral QAC breakfast  . simvastatin  10 mg Oral q1800  . sodium chloride  10 mL Intravenous Q12H   I/O last 3 completed shifts: In: 1090 [P.O.:700; I.V.:390] Out: 2176 [Urine:2175; Stool:1] Total I/O In: 360 [P.O.:360] Out: 400 [Urine:400]  Nutrition dx: Inadequate oral intake now r/t recovery from acute illness as evidenced by patient's recently poor intake. Improving at this time. Goal: TF goal - n/a. New goal: Pt to complete >90% of estimated needs. Met at this time.  Plan: 1. Pt is eating well, intake of meals adequate at this time. May benefit from additional kcal intake, will add Ensure Pudding BID between meals.  RD to continue to follow.  #[id number]

## 2011-09-17 NOTE — Progress Notes (Signed)
Subjective: Patient seen.Feels better Eager to go home.Denies any chest pain or sob  Objective: Weight change: -1.275 kg (-2 lb 13 oz)  Intake/Output Summary (Last 24 hours) at 09/17/11 0804 Last data filed at 09/17/11 0646  Gross per 24 hour  Intake    920 ml  Output   1675 ml  Net   -755 ml   BP 122/62  Pulse 66  Temp(Src) 97.7 F (36.5 C) (Oral)  Resp 18  Ht 6\' 1"  (1.854 m)  Wt 70.625 kg (155 lb 11.2 oz)  BMI 20.54 kg/m2  SpO2 99% Physical Exam: General appearance: alert, cooperative and no distress Head: Normocephalic, without obvious abnormality, atraumatic Neck: no adenopathy, no carotid bruit, no JVD, supple, symmetrical, trachea midline and thyroid not enlarged, symmetric, no tenderness/mass/nodules Lungs: Decrease air entry bibasally.No adventitial sounds Heart: regular rate and rhythm, S1, S2 normal, no murmur, click, rub or gallop Abdomen: soft, non-tender; bowel sounds normal; no masses,  no organomegaly Extremities: extremities normal, atraumatic, no cyanosis or edema Skin: Skin color, texture, turgor normal. No rashes or lesions  Lab Results: @labtest @  Micro Results: No results found for this or any previous visit (from the past 240 hour(s)).  Studies/Results: Dg Chest Portable 1 View  09/12/2011  *RADIOLOGY REPORT*  Clinical Data: Shortness of breath post extubation  PORTABLE CHEST - 1 VIEW  Comparison: 09/11/2011; 09/10/2011; 09/09/2011  Findings:  Unchanged cardiac silhouette and mediastinal contours with persistent obscuration of the right heart border secondary to consolidative opacity.  Interval increase in left-sided retrocardiac consolidative opacities.  Stable position of support apparatus.  No definite pneumothorax.  Blunting of bilateral costophrenic angles may suggest small bilateral effusions. Unchanged increased interstitial markings within the right upper lung.  Unchanged bones.  IMPRESSION: 1.  No change to minimal increase in left basilar  consolidative opacities with findings of partial atelectasis/collapse of the bilateral lower lungs. 2.  Unchanged heterogeneous opacities within the right upper lung, possibly infectious in etiology, though asymmetric pulmonary edema may have a similar appearance.  Original Report Authenticated By: Waynard Reeds, M.D.   Dg Chest Portable 1 View  09/11/2011  *RADIOLOGY REPORT*  Clinical Data: Check ET tube placement  PORTABLE CHEST - 1 VIEW  Comparison: 09/10/2011  Findings: The endotracheal tube has been removed.  There is a right IJ catheter with tip in the SVC.  Pleural effusions and interstitial edema are unchanged from previous exam.  Right upper lobe and bibasilar opacities are stable.  IMPRESSION:  1.  No change in aeration to the lungs compared with previous exam. 2.  Status post extubation.  Original Report Authenticated By: Rosealee Albee, M.D.   Dg Chest Portable 1 View  09/10/2011  *RADIOLOGY REPORT*  Clinical Data: Evaluate ET tube placement  PORTABLE CHEST - 1 VIEW  Comparison: 09/09/2011  Findings: The endotracheal tube tip is above the carina.  Right IJ catheter tip is in the SVC.  The heart size is mildly enlarged.  Persistent low lung volumes with small effusions and mild edema. There has been improvement in right upper lobe airspace opacity.  IMPRESSION:  1.  Improvement in aeration to the right upper lobe. 2.  Persistent low lung volumes and small effusions/edema.  Original Report Authenticated By: Rosealee Albee, M.D.   Dg Chest Portable 1 View  09/09/2011  *RADIOLOGY REPORT*  Clinical Data: Pneumonia, shortness of breath.  PORTABLE CHEST - 1 VIEW  Comparison: 09/08/2011  Findings: Interval placement NG tube in the stomach.  Support devices  otherwise stable.  Patchy bilateral airspace disease, most pronounced in the right upper lobe and lung bases has increased slightly since prior study.  Mild cardiomegaly.  No definite effusions.  IMPRESSION: Increasing patchy bilateral airspace  disease.  Original Report Authenticated By: Cyndie Chime, M.D.   Dg Chest Portable 1 View  09/08/2011  *RADIOLOGY REPORT*  Clinical Data: Post right IJ line placement  PORTABLE CHEST - 1 VIEW  Comparison: 08/08/2011; 09/06/2011  Findings: Grossly unchanged cardiac silhouette and mediastinal contour.  Interval intubation with endotracheal tube overlying tracheal air column with tip supra to the carina.  Interval placement of right jugular approach into venous catheter with tip overlying the superior cavoatrial junction.  No pneumothorax.  No change to minimal improved aeration of the right upper lung with persistent right upper lung heterogeneous opacities. Grossly unchanged left perihilar and basilar heterogeneous opacities, possibly atelectasis.  No new focal airspace opacities.  No definite pleural effusion.  Grossly unchanged bones.  IMPRESSION:     1.    Appropriately positioned support apparatus as above.  No       pneumothorax.        2.    No change to minimally improved aeration of the right upper lung with persistent heterogeneous opacities worrisome for infection. Original Report Authenticated By: Waynard Reeds, M.D.   Dg Chest Portable 1 View  09/08/2011  *RADIOLOGY REPORT*  Clinical Data: Shortness of breath, pneumonia.  PORTABLE CHEST - 1 VIEW  Comparison: 09/06/2011  Findings: Patchy airspace disease in the right upper lobe compatible with pneumonia.  This is increased since prior study. Continued patchy opacity in the left upper lobe.  Heart is enlarged.  No effusions.  IMPRESSION: Worsening consolidation in the right upper lobe compatible with worsening pneumonia.  Stable patchy left upper lobe opacity.  Original Report Authenticated By: Cyndie Chime, M.D.   Dg Chest Portable 1 View  09/06/2011  *RADIOLOGY REPORT*  Clinical Data: Shortness of breath, chest pain, some difficulty breathing  PORTABLE CHEST - 1 VIEW  Comparison: Portable chest x-ray of 06/18/2011  Findings: There is poorly  defined airspace disease right greater than left.  Pneumonia cannot be excluded with edema less likely in view of the asymmetry and peripheral distribution.  Cardiomegaly is stable.  No effusion is seen.  IMPRESSION: Patchy airspace disease bilaterally.  Favor multifocal pneumonia. Recommend follow-up two-view chest x-ray  Original Report Authenticated By: Juline Patch, M.D.   Dg Swallowing Func-no Report  09/13/2011  CLINICAL DATA: r/o aspiration   FLUOROSCOPY FOR SWALLOWING FUNCTION STUDY:  Fluoroscopy was provided for swallowing function study, which was  administered by a speech pathologist.  Final results and recommendations  from this study are contained within the speech pathology report.     Medications: Scheduled Meds:   . aspirin EC  81 mg Oral Daily  . enoxaparin (LOVENOX) injection  75 mg Subcutaneous Once  . enoxaparin (LOVENOX) injection  75 mg Subcutaneous Q12H  . furosemide  40 mg Oral Daily  . ipratropium  0.5 mg Nebulization Q6H  . losartan  12.5 mg Oral Daily  . potassium chloride  20 mEq Oral BID  . predniSONE  30 mg Oral QAC breakfast  . simvastatin  10 mg Oral q1800  . sodium chloride  10 mL Intravenous Q12H   Continuous Infusions:   . sodium chloride 10 mL/hr at 09/16/11 2000   PRN Meds:.acetaminophen, diazepam, food thickener, levalbuterol, morphine injection, ondansetron (ZOFRAN) IV, oxyCODONE, sodium chloride  Assessment/Plan:  VDRF s/p extubation-stable Pneumonia-resolving Sepsis-resolving nstemi-medical management Atrial fibrillation-ventricular rate is controlled. Copd-nebs treatment prn HTN-blood pressure is stable. Hx of CHF(systolic dysfunction)-not decompensating.continue diuresis Deconditioning-physical therapy Patient is for d/c home on 09/18/11 in a.m   LOS: 11 days   Todd [name] 09/17/2011, 8:04 AM

## 2011-09-17 NOTE — Progress Notes (Signed)
Physical Therapy Treatment Patient Details Name: Todd [name] MRN: [id number] DOB: [birth year] Today's Date: 09/17/2011  PT Assessment/Plan  PT Goals  Acute Rehab PT Goals PT Goal: Supine/Side to Sit - Progress: Met Pt will Transfer Sit to Stand/Stand to Sit: with modified independence PT Transfer Goal: Sit to Stand/Stand to Sit - Progress: Progressing toward goal PT Transfer Goal: Bed to Chair/Chair to Bed - Progress: Progressing toward goal PT Goal: Ambulate - Progress: Progressing toward goal PT Goal: Up/Down Stairs - Progress:  (not addressed)  PT Treatment Precautions/Restrictions  Precautions Precautions: Fall Required Braces or Orthoses: No Restrictions Weight Bearing Restrictions: No Mobility (including Balance) Bed Mobility Bed Mobility: Yes Supine to Sit: 7: Independent Supine to Sit Details (indicate cue type and reason): no cuing needed Sitting - Scoot to Edge of Bed: 7: Independent Sitting - Scoot to [place] of Bed Details (indicate cue type and reason): no cuing needed Transfers Transfers: Yes Sit to Stand: 4: Min assist;From bed Sit to Stand Details (indicate cue type and reason): vc's for hand placement Stand to Sit: 5: Supervision Stand to Sit Details: vc's for hand placement Ambulation/Gait Ambulation/Gait: Yes Ambulation/Gait Assistance: 4: Min assist Ambulation/Gait Assistance Details (indicate cue type and reason): vc's for improved Rw use and postural check; manual A for maneuvering RW Ambulation Distance (Feet): 140 Feet Assistive device: Rolling walker Gait Pattern: Trunk flexed;Within Functional Limits (mildly unsteady through out)  Posture/Postural Control Posture/Postural Control:  (flexed trunk unless consistently cued) Exercise    End of Session PT - End of Session Activity Tolerance: Patient limited by fatigue Patient left: in bed General Behavior During Session: [facility] for tasks performed Cognition: [facility] for tasks performed  Rainie Crenshaw,  Eliseo Gum 09/17/2011, 5:06 PM  09/17/2011  Rock Bing, PT [phone] (pager)

## 2011-09-17 NOTE — Progress Notes (Signed)
Attempted to call spouse and son's this am to update them on patient's transfer with no answer. RN on 2000 updated. [name] [name] 09/17/2011 [phone]

## 2011-09-17 NOTE — Progress Notes (Signed)
Physical Therapy Treatment Patient Details Name: Todd [name] MRN: [id number] DOB: [birth year] Today's Date: 09/17/2011  PT Assessment/Plan  PT - Assessment/Plan Comments on Treatment Session: Notable fatigue and instability, but improving in activity tolerance PT Plan: Discharge plan remains appropriate Follow Up Recommendations: Home health PT;24 hour supervision/assistance Equipment Recommended: None recommended by PT PT Goals  Acute Rehab PT Goals PT Goal: Supine/Side to Sit - Progress: Met Pt will Transfer Sit to Stand/Stand to Sit: with modified independence PT Transfer Goal: Sit to Stand/Stand to Sit - Progress: Progressing toward goal PT Transfer Goal: Bed to Chair/Chair to Bed - Progress: Progressing toward goal PT Goal: Ambulate - Progress: Progressing toward goal PT Goal: Up/Down Stairs - Progress:  (not addressed)  PT Treatment Precautions/Restrictions  Precautions Precautions: Fall Required Braces or Orthoses: No Restrictions Weight Bearing Restrictions: No Mobility (including Balance) Bed Mobility Bed Mobility: Yes Supine to Sit: 7: Independent Supine to Sit Details (indicate cue type and reason): no cuing needed Sitting - Scoot to Edge of Bed: 7: Independent Sitting - Scoot to [place] of Bed Details (indicate cue type and reason): no cuing needed Transfers Transfers: Yes Sit to Stand: 4: Min assist;From bed Sit to Stand Details (indicate cue type and reason): vc's for hand placement Stand to Sit: 5: Supervision Stand to Sit Details: vc's for hand placement Ambulation/Gait Ambulation/Gait: Yes Ambulation/Gait Assistance: 4: Min assist Ambulation/Gait Assistance Details (indicate cue type and reason): vc's for improved Rw use and postural check; manual A for maneuvering RW Ambulation Distance (Feet): 140 Feet Assistive device: Rolling walker Gait Pattern: Trunk flexed;Within Functional Limits (mildly unsteady through out)  Posture/Postural  Control Posture/Postural Control:  (flexed trunk unless consistently cued) Exercise    End of Session PT - End of Session Activity Tolerance: Patient limited by fatigue Patient left: in bed General Behavior During Session: [facility] for tasks performed Cognition: [facility] for tasks performed  Todd [name], Eliseo Gum 09/17/2011, 5:07 PM  09/17/2011  Arnold Bing, PT [phone] (pager)

## 2011-09-18 ENCOUNTER — Inpatient Hospital Stay (HOSPITAL_COMMUNITY): Payer: Medicare Other

## 2011-09-18 LAB — CBC
Hemoglobin: 10.1 g/dL — ABNORMAL LOW (ref 13.0–17.0)
MCH: 25.9 pg — ABNORMAL LOW (ref 26.0–34.0)
MCHC: 31.7 g/dL (ref 30.0–36.0)
Platelets: 382 10*3/uL (ref 150–400)
RDW: 17.6 % — ABNORMAL HIGH (ref 11.5–15.5)

## 2011-09-18 LAB — COMPREHENSIVE METABOLIC PANEL
ALT: 16 U/L (ref 0–53)
AST: 16 U/L (ref 0–37)
Albumin: 2.8 g/dL — ABNORMAL LOW (ref 3.5–5.2)
Alkaline Phosphatase: 80 U/L (ref 39–117)
Calcium: 9.8 mg/dL (ref 8.4–10.5)
GFR calc Af Amer: 69 mL/min — ABNORMAL LOW (ref >90)
Potassium: 5.2 mEq/L — ABNORMAL HIGH (ref 3.5–5.1)
Sodium: 141 mEq/L (ref 135–145)
Total Protein: 6.4 g/dL (ref 6.0–8.3)

## 2011-09-18 LAB — GLUCOSE, CAPILLARY: Glucose-Capillary: 170 mg/dL — ABNORMAL HIGH (ref 70–99)

## 2011-09-18 MED ORDER — IPRATROPIUM BROMIDE 0.02 % IN SOLN: 0.5000 mg | Freq: Four times a day (QID) | RESPIRATORY_TRACT | Status: DC | PRN

## 2011-09-18 MED ORDER — FUROSEMIDE 20 MG PO TABS
20.0000 mg | ORAL_TABLET | Freq: Every day | ORAL | Status: DC
  Administered 2011-09-18 – 2011-09-20 (×3): 20 mg via ORAL
  Filled 2011-09-18 (×3): qty 1

## 2011-09-18 NOTE — Progress Notes (Signed)
ANTICOAGULATION CONSULT NOTE - Follow Up Consult  Pharmacy Consult for lovenox Indication: atrial fibrillation  No Known Allergies  Patient Measurements: Height: 6\' 1"  (185.4 cm) Weight: 162 lb 14.7 oz (73.9 kg) IBW/kg (Calculated) : 79.9  Adjusted Body Weight:   Vital Signs: Temp: 97 F (36.1 C) (11/17 0500) Temp src: Oral (11/17 0500) BP: 131/75 mmHg (11/17 0500) Pulse Rate: 73  (11/17 0500)  Labs:  Basename 09/18/11 0740 09/17/11 0515 09/16/11 0555  HGB 10.1* 9.8* --  HCT 31.9* 30.7* 31.2*  PLT 382 373 355  APTT -- -- --  LABPROT -- -- --  INR -- -- --  HEPARINUNFRC -- -- --  CREATININE 1.09 0.94 --  CKTOTAL -- -- --  CKMB -- -- --  TROPONINI -- -- --   Estimated Creatinine Clearance: 51.8 ml/min (by C-G formula based on Cr of 1.09).   Medications:  Scheduled:    . aspirin EC  81 mg Oral Daily  . enoxaparin (LOVENOX) injection  70 mg Subcutaneous Q12H  . furosemide  20 mg Oral Daily  . ipratropium  0.5 mg Nebulization Q6H  . losartan  12.5 mg Oral Daily  . potassium chloride  20 mEq Oral BID  . predniSONE  30 mg Oral QAC breakfast  . simvastatin  10 mg Oral q1800  . sodium chloride  10 mL Intravenous Q12H  . DISCONTD: furosemide  40 mg Oral Daily    Assessment: 77 yo male with afib is currently on full dose lovenox. Patient's weight fluctuates a bit; if weigh continues to be ~73kg tom, will adjust dose. Goal of Therapy:  4hr heparin level 0.6-1.2   Plan:  1) Cont lovenox 70mg  sq q12h 2) CBC q72hr  Malaysha Arlen, Tsz-Yin 09/18/2011,11:58 AM

## 2011-09-18 NOTE — Progress Notes (Signed)
CSW informed by MD that Pt interested in [facility] for rehab and is ready for d/c.  CSW noted Pt has been offered and accepted, message left for nurse supervisor.  CSW spoke with Pt dtr who stated while Pt has been resistant to rehab, he is agreeable now and family has been interested in this placement.  CSW agreed to contact dtr Stanton Kidney [phone]) when dispo is determined.  Dtr has not yet signed paperwork. Milus Banister MSW,LCSW w/e Coverage [phone]

## 2011-09-18 NOTE — Progress Notes (Signed)
Subjective: Patient seen.Denies any complains.No chest pain or sob.Patients family want patient to go to [facility] for Rehab and patient is agreeable to it.  Objective: Weight change: 3.275 kg (7 lb 3.5 oz)  Intake/Output Summary (Last 24 hours) at 09/18/11 0816 Last data filed at 09/18/11 0600  Gross per 24 hour  Intake    760 ml  Output   1746 ml  Net   -986 ml   BP 131/75  Pulse 73  Temp(Src) 97 F (36.1 C) (Oral)  Resp 18  Ht 6\' 1"  (1.854 m)  Wt 73.9 kg (162 lb 14.7 oz)  BMI 21.49 kg/m2  SpO2 97% Physical Exam: General appearance: alert, cooperative and no distress Head: Normocephalic, without obvious abnormality, atraumatic Neck: no adenopathy, no carotid bruit, no JVD, supple, symmetrical, trachea midline and thyroid not enlarged, symmetric, no tenderness/mass/nodules Lungs: clear to auscultation bilaterally Heart: regular rate and rhythm, S1, S2 normal, no murmur, click, rub or gallop Abdomen: soft, non-tender; bowel sounds normal; no masses,  no organomegaly Extremities: extremities normal, atraumatic, no cyanosis or edema Skin: Skin color, texture, turgor normal. No rashes or lesions  Lab Results: @labtest @  Micro Results: No results found for this or any previous visit (from the past 240 hour(s)).  Studies/Results: Dg Chest Portable 1 View  09/12/2011  *RADIOLOGY REPORT*  Clinical Data: Shortness of breath post extubation  PORTABLE CHEST - 1 VIEW  Comparison: 09/11/2011; 09/10/2011; 09/09/2011  Findings:  Unchanged cardiac silhouette and mediastinal contours with persistent obscuration of the right heart border secondary to consolidative opacity.  Interval increase in left-sided retrocardiac consolidative opacities.  Stable position of support apparatus.  No definite pneumothorax.  Blunting of bilateral costophrenic angles may suggest small bilateral effusions. Unchanged increased interstitial markings within the right upper lung.  Unchanged bones.  IMPRESSION:  1.  No change to minimal increase in left basilar consolidative opacities with findings of partial atelectasis/collapse of the bilateral lower lungs. 2.  Unchanged heterogeneous opacities within the right upper lung, possibly infectious in etiology, though asymmetric pulmonary edema may have a similar appearance.  Original Report Authenticated By: Waynard Reeds, M.D.   Dg Chest Portable 1 View  09/11/2011  *RADIOLOGY REPORT*  Clinical Data: Check ET tube placement  PORTABLE CHEST - 1 VIEW  Comparison: 09/10/2011  Findings: The endotracheal tube has been removed.  There is a right IJ catheter with tip in the SVC.  Pleural effusions and interstitial edema are unchanged from previous exam.  Right upper lobe and bibasilar opacities are stable.  IMPRESSION:  1.  No change in aeration to the lungs compared with previous exam. 2.  Status post extubation.  Original Report Authenticated By: Rosealee Albee, M.D.   Dg Chest Portable 1 View  09/10/2011  *RADIOLOGY REPORT*  Clinical Data: Evaluate ET tube placement  PORTABLE CHEST - 1 VIEW  Comparison: 09/09/2011  Findings: The endotracheal tube tip is above the carina.  Right IJ catheter tip is in the SVC.  The heart size is mildly enlarged.  Persistent low lung volumes with small effusions and mild edema. There has been improvement in right upper lobe airspace opacity.  IMPRESSION:  1.  Improvement in aeration to the right upper lobe. 2.  Persistent low lung volumes and small effusions/edema.  Original Report Authenticated By: Rosealee Albee, M.D.   Dg Chest Portable 1 View  09/09/2011  *RADIOLOGY REPORT*  Clinical Data: Pneumonia, shortness of breath.  PORTABLE CHEST - 1 VIEW  Comparison: 09/08/2011  Findings: Interval  placement NG tube in the stomach.  Support devices otherwise stable.  Patchy bilateral airspace disease, most pronounced in the right upper lobe and lung bases has increased slightly since prior study.  Mild cardiomegaly.  No definite effusions.   IMPRESSION: Increasing patchy bilateral airspace disease.  Original Report Authenticated By: Cyndie Chime, M.D.   Dg Chest Portable 1 View  09/08/2011  *RADIOLOGY REPORT*  Clinical Data: Post right IJ line placement  PORTABLE CHEST - 1 VIEW  Comparison: 08/08/2011; 09/06/2011  Findings: Grossly unchanged cardiac silhouette and mediastinal contour.  Interval intubation with endotracheal tube overlying tracheal air column with tip supra to the carina.  Interval placement of right jugular approach into venous catheter with tip overlying the superior cavoatrial junction.  No pneumothorax.  No change to minimal improved aeration of the right upper lung with persistent right upper lung heterogeneous opacities. Grossly unchanged left perihilar and basilar heterogeneous opacities, possibly atelectasis.  No new focal airspace opacities.  No definite pleural effusion.  Grossly unchanged bones.  IMPRESSION:     1.    Appropriately positioned support apparatus as above.  No       pneumothorax.        2.    No change to minimally improved aeration of the right upper lung with persistent heterogeneous opacities worrisome for infection. Original Report Authenticated By: Waynard Reeds, M.D.   Dg Chest Portable 1 View  09/08/2011  *RADIOLOGY REPORT*  Clinical Data: Shortness of breath, pneumonia.  PORTABLE CHEST - 1 VIEW  Comparison: 09/06/2011  Findings: Patchy airspace disease in the right upper lobe compatible with pneumonia.  This is increased since prior study. Continued patchy opacity in the left upper lobe.  Heart is enlarged.  No effusions.  IMPRESSION: Worsening consolidation in the right upper lobe compatible with worsening pneumonia.  Stable patchy left upper lobe opacity.  Original Report Authenticated By: Cyndie Chime, M.D.   Dg Chest Portable 1 View  09/06/2011  *RADIOLOGY REPORT*  Clinical Data: Shortness of breath, chest pain, some difficulty breathing  PORTABLE CHEST - 1 VIEW  Comparison: Portable  chest x-ray of 06/18/2011  Findings: There is poorly defined airspace disease right greater than left.  Pneumonia cannot be excluded with edema less likely in view of the asymmetry and peripheral distribution.  Cardiomegaly is stable.  No effusion is seen.  IMPRESSION: Patchy airspace disease bilaterally.  Favor multifocal pneumonia. Recommend follow-up two-view chest x-ray  Original Report Authenticated By: Juline Patch, M.D.   Dg Swallowing Func-no Report  09/13/2011  CLINICAL DATA: r/o aspiration   FLUOROSCOPY FOR SWALLOWING FUNCTION STUDY:  Fluoroscopy was provided for swallowing function study, which was  administered by a speech pathologist.  Final results and recommendations  from this study are contained within the speech pathology report.     Medications: Scheduled Meds:   . aspirin EC  81 mg Oral Daily  . enoxaparin (LOVENOX) injection  70 mg Subcutaneous Q12H  . furosemide  20 mg Oral Daily  . ipratropium  0.5 mg Nebulization Q6H  . losartan  12.5 mg Oral Daily  . potassium chloride  20 mEq Oral BID  . predniSONE  30 mg Oral QAC breakfast  . simvastatin  10 mg Oral q1800  . sodium chloride  10 mL Intravenous Q12H  . DISCONTD: enoxaparin (LOVENOX) injection  75 mg Subcutaneous Once  . DISCONTD: enoxaparin (LOVENOX) injection  75 mg Subcutaneous Q12H  . DISCONTD: furosemide  40 mg Oral Daily  Continuous Infusions:   . sodium chloride 10 mL/hr at 09/16/11 2000   PRN Meds:.acetaminophen, diazepam, food thickener, levalbuterol, morphine injection, ondansetron (ZOFRAN) IV, oxyCODONE, sodium chloride  Assessment/Plan: VDRF s/p extubation-pt tolerating oxygen via nasal canulla Sepsis-resolved Pneumonia-resolved NSTEMI-medical magagement Atrial fibrillation-rate is controlled Hx of chf-will decrease the dose of lasix because bun is trending up. Copd-nebs treatment prn HTN-blood pressure is stable Deconditioning-for placement at [facility]   LOS: 12 days    Jacson Rapaport 09/18/2011, 8:16 AM

## 2011-09-19 LAB — DIFFERENTIAL
Basophils Absolute: 0 10*3/uL (ref 0.0–0.1)
Basophils Relative: 0 % (ref 0–1)
Lymphocytes Relative: 15 % (ref 12–46)
Monocytes Absolute: 1.3 10*3/uL — ABNORMAL HIGH (ref 0.1–1.0)
Monocytes Relative: 10 % (ref 3–12)
Neutro Abs: 9 10*3/uL — ABNORMAL HIGH (ref 1.7–7.7)
Neutrophils Relative %: 74 % (ref 43–77)

## 2011-09-19 LAB — CBC
HCT: 31.7 % — ABNORMAL LOW (ref 39.0–52.0)
HCT: 32 % — ABNORMAL LOW (ref 39.0–52.0)
Hemoglobin: 10.1 g/dL — ABNORMAL LOW (ref 13.0–17.0)
Hemoglobin: 10.4 g/dL — ABNORMAL LOW (ref 13.0–17.0)
MCH: 26.4 pg (ref 26.0–34.0)
MCHC: 31.9 g/dL (ref 30.0–36.0)
MCHC: 32.5 g/dL (ref 30.0–36.0)
MCV: 81.2 fL (ref 78.0–100.0)
RDW: 17.5 % — ABNORMAL HIGH (ref 11.5–15.5)
WBC: 12.2 10*3/uL — ABNORMAL HIGH (ref 4.0–10.5)

## 2011-09-19 LAB — COMPREHENSIVE METABOLIC PANEL
AST: 16 U/L (ref 0–37)
Albumin: 2.8 g/dL — ABNORMAL LOW (ref 3.5–5.2)
Alkaline Phosphatase: 73 U/L (ref 39–117)
BUN: 43 mg/dL — ABNORMAL HIGH (ref 6–23)
CO2: 28 mEq/L (ref 19–32)
Chloride: 102 mEq/L (ref 96–112)
GFR calc non Af Amer: 66 mL/min — ABNORMAL LOW (ref >90)
Potassium: 4.2 mEq/L (ref 3.5–5.1)
Total Bilirubin: 0.4 mg/dL (ref 0.3–1.2)

## 2011-09-19 LAB — GLUCOSE, CAPILLARY: Glucose-Capillary: 122 mg/dL — ABNORMAL HIGH (ref 70–99)

## 2011-09-19 MED ORDER — ENOXAPARIN SODIUM 80 MG/0.8ML ~~LOC~~ SOLN
75.0000 mg | Freq: Two times a day (BID) | SUBCUTANEOUS | Status: DC
  Administered 2011-09-19: 75 mg via SUBCUTANEOUS
  Filled 2011-09-19 (×3): qty 0.8

## 2011-09-19 NOTE — Progress Notes (Signed)
Subjective: Patient seen.Denies any complaints.Awaiting placement at [facility] nursing facility  Objective: Weight change:   Intake/Output Summary (Last 24 hours) at 09/19/11 0745 Last data filed at 09/19/11 0700  Gross per 24 hour  Intake    240 ml  Output    901 ml  Net   -661 ml   BP 131/77  Pulse 70  Temp(Src) 98.3 F (36.8 C) (Oral)  Resp 18  Ht 6\' 1"  (1.854 m)  Wt 73.9 kg (162 lb 14.7 oz)  BMI 21.49 kg/m2  SpO2 98% Physical Exam: General appearance: alert, cooperative and no distress Head: Normocephalic, without obvious abnormality, atraumatic Neck: no adenopathy, no carotid bruit, no JVD, supple, symmetrical, trachea midline and thyroid not enlarged, symmetric, no tenderness/mass/nodules Lungs: clear to auscultation bilaterally Heart: regular rate and rhythm, S1, S2 normal, no murmur, click, rub or gallop Abdomen: soft, non-tender; bowel sounds normal; no masses,  no organomegaly Extremities: extremities normal, atraumatic, no cyanosis or edema Skin: Skin color, texture, turgor normal. No rashes or lesions  Lab Results: @labtest @  Micro Results: No results found for this or any previous visit (from the past 240 hour(s)).  Studies/Results: Dg Chest 1 View  09/18/2011  *RADIOLOGY REPORT*  Clinical Data: Pneumonia, SOB, cough  CHEST - 1 VIEW  Comparison: 09/12/2011  Findings: Mild right upper lobe opacity, suspicious for pneumonia, improved.  Mild bibasilar opacities, possibly atelectasis. No pleural effusion or pneumothorax.  The heart is top normal in size.  Degenerative changes of the visualized thoracolumbar spine.  IMPRESSION: Mild right upper lobe opacity, suspicious for pneumonia, improved.  Bibasilar opacities, possibly atelectasis.  Original Report Authenticated By: Charline Bills, M.D.   Dg Chest Portable 1 View  09/12/2011  *RADIOLOGY REPORT*  Clinical Data: Shortness of breath post extubation  PORTABLE CHEST - 1 VIEW  Comparison: 09/11/2011;  09/10/2011; 09/09/2011  Findings:  Unchanged cardiac silhouette and mediastinal contours with persistent obscuration of the right heart border secondary to consolidative opacity.  Interval increase in left-sided retrocardiac consolidative opacities.  Stable position of support apparatus.  No definite pneumothorax.  Blunting of bilateral costophrenic angles may suggest small bilateral effusions. Unchanged increased interstitial markings within the right upper lung.  Unchanged bones.  IMPRESSION: 1.  No change to minimal increase in left basilar consolidative opacities with findings of partial atelectasis/collapse of the bilateral lower lungs. 2.  Unchanged heterogeneous opacities within the right upper lung, possibly infectious in etiology, though asymmetric pulmonary edema may have a similar appearance.  Original Report Authenticated By: Waynard Reeds, M.D.   Dg Chest Portable 1 View  09/11/2011  *RADIOLOGY REPORT*  Clinical Data: Check ET tube placement  PORTABLE CHEST - 1 VIEW  Comparison: 09/10/2011  Findings: The endotracheal tube has been removed.  There is a right IJ catheter with tip in the SVC.  Pleural effusions and interstitial edema are unchanged from previous exam.  Right upper lobe and bibasilar opacities are stable.  IMPRESSION:  1.  No change in aeration to the lungs compared with previous exam. 2.  Status post extubation.  Original Report Authenticated By: Rosealee Albee, M.D.   Dg Chest Portable 1 View  09/10/2011  *RADIOLOGY REPORT*  Clinical Data: Evaluate ET tube placement  PORTABLE CHEST - 1 VIEW  Comparison: 09/09/2011  Findings: The endotracheal tube tip is above the carina.  Right IJ catheter tip is in the SVC.  The heart size is mildly enlarged.  Persistent low lung volumes with small effusions and mild edema. There has been  improvement in right upper lobe airspace opacity.  IMPRESSION:  1.  Improvement in aeration to the right upper lobe. 2.  Persistent low lung volumes and small  effusions/edema.  Original Report Authenticated By: Rosealee Albee, M.D.   Dg Chest Portable 1 View  09/09/2011  *RADIOLOGY REPORT*  Clinical Data: Pneumonia, shortness of breath.  PORTABLE CHEST - 1 VIEW  Comparison: 09/08/2011  Findings: Interval placement NG tube in the stomach.  Support devices otherwise stable.  Patchy bilateral airspace disease, most pronounced in the right upper lobe and lung bases has increased slightly since prior study.  Mild cardiomegaly.  No definite effusions.  IMPRESSION: Increasing patchy bilateral airspace disease.  Original Report Authenticated By: Cyndie Chime, M.D.   Dg Chest Portable 1 View  09/08/2011  *RADIOLOGY REPORT*  Clinical Data: Post right IJ line placement  PORTABLE CHEST - 1 VIEW  Comparison: 08/08/2011; 09/06/2011  Findings: Grossly unchanged cardiac silhouette and mediastinal contour.  Interval intubation with endotracheal tube overlying tracheal air column with tip supra to the carina.  Interval placement of right jugular approach into venous catheter with tip overlying the superior cavoatrial junction.  No pneumothorax.  No change to minimal improved aeration of the right upper lung with persistent right upper lung heterogeneous opacities. Grossly unchanged left perihilar and basilar heterogeneous opacities, possibly atelectasis.  No new focal airspace opacities.  No definite pleural effusion.  Grossly unchanged bones.  IMPRESSION:     1.    Appropriately positioned support apparatus as above.  No       pneumothorax.        2.    No change to minimally improved aeration of the right upper lung with persistent heterogeneous opacities worrisome for infection. Original Report Authenticated By: Waynard Reeds, M.D.   Dg Chest Portable 1 View  09/08/2011  *RADIOLOGY REPORT*  Clinical Data: Shortness of breath, pneumonia.  PORTABLE CHEST - 1 VIEW  Comparison: 09/06/2011  Findings: Patchy airspace disease in the right upper lobe compatible with pneumonia.   This is increased since prior study. Continued patchy opacity in the left upper lobe.  Heart is enlarged.  No effusions.  IMPRESSION: Worsening consolidation in the right upper lobe compatible with worsening pneumonia.  Stable patchy left upper lobe opacity.  Original Report Authenticated By: Cyndie Chime, M.D.   Dg Chest Portable 1 View  09/06/2011  *RADIOLOGY REPORT*  Clinical Data: Shortness of breath, chest pain, some difficulty breathing  PORTABLE CHEST - 1 VIEW  Comparison: Portable chest x-ray of 06/18/2011  Findings: There is poorly defined airspace disease right greater than left.  Pneumonia cannot be excluded with edema less likely in view of the asymmetry and peripheral distribution.  Cardiomegaly is stable.  No effusion is seen.  IMPRESSION: Patchy airspace disease bilaterally.  Favor multifocal pneumonia. Recommend follow-up two-view chest x-ray  Original Report Authenticated By: Juline Patch, M.D.   Dg Swallowing Func-no Report  09/13/2011  CLINICAL DATA: r/o aspiration   FLUOROSCOPY FOR SWALLOWING FUNCTION STUDY:  Fluoroscopy was provided for swallowing function study, which was  administered by a speech pathologist.  Final results and recommendations  from this study are contained within the speech pathology report.     Medications: Scheduled Meds:   . aspirin EC  81 mg Oral Daily  . enoxaparin (LOVENOX) injection  70 mg Subcutaneous Q12H  . furosemide  20 mg Oral Daily  . losartan  12.5 mg Oral Daily  . potassium chloride  20 mEq  Oral BID  . predniSONE  30 mg Oral QAC breakfast  . simvastatin  10 mg Oral q1800  . sodium chloride  10 mL Intravenous Q12H  . DISCONTD: furosemide  40 mg Oral Daily  . DISCONTD: ipratropium  0.5 mg Nebulization Q6H   Continuous Infusions:   . sodium chloride 10 mL/hr at 09/16/11 2000   PRN Meds:.acetaminophen, diazepam, food thickener, ipratropium, levalbuterol, morphine injection, ondansetron (ZOFRAN) IV, oxyCODONE, sodium  chloride  Assessment/Plan: #1 VDRF s/p extubation-pt tolerating atmospheric oxygen #2 Sepsis-resolved #3 Pneumonia-resolved  #4 NSTMI-medical management #5 Copd-stable #6 Atrial fibrillation-rate is controlled   #7 HTN-Blood pressure is stable #8 Hyperlipidemia Patient is medically stable.Awaiting placement   LOS: 13 days   Todd [name] 09/19/2011, 7:45 AM

## 2011-09-19 NOTE — Progress Notes (Signed)
CSW received message that Pt will not be able to go to [facility] today as paperwork was not completed prior to week.  MD, Dtr informed. Milus Banister MSW,LCSW w/e Coverage [phone]

## 2011-09-19 NOTE — Progress Notes (Signed)
ANTICOAGULATION CONSULT NOTE - Follow Up Consult  Pharmacy Consult for lovenox Indication: atrial fibrillation  No Known Allergies  Patient Measurements: Height: 6\' 1"  (185.4 cm) Weight: 162 lb 14.7 oz (73.9 kg) IBW/kg (Calculated) : 79.9  Adjusted Body Weight:   Vital Signs: Temp: 98.3 F (36.8 C) (11/18 0512) Temp src: Oral (11/18 0512) BP: 131/77 mmHg (11/18 0512) Pulse Rate: 70  (11/18 0512)  Labs:  Basename 09/19/11 0930 09/19/11 0700 09/18/11 0740 09/17/11 0515  HGB 10.4* 10.1* -- --  HCT 32.0* 31.7* 31.9* --  PLT 405* 397 382 --  APTT -- -- -- --  LABPROT -- -- -- --  INR -- -- -- --  HEPARINUNFRC -- -- -- --  CREATININE -- 1.01 1.09 0.94  CKTOTAL -- -- -- --  CKMB -- -- -- --  TROPONINI -- -- -- --   Estimated Creatinine Clearance: 55.9 ml/min (by C-G formula based on Cr of 1.01).   Medications:  Scheduled:    . aspirin EC  81 mg Oral Daily  . enoxaparin (LOVENOX) injection  75 mg Subcutaneous Q12H  . furosemide  20 mg Oral Daily  . losartan  12.5 mg Oral Daily  . potassium chloride  20 mEq Oral BID  . predniSONE  30 mg Oral QAC breakfast  . simvastatin  10 mg Oral q1800  . sodium chloride  10 mL Intravenous Q12H  . DISCONTD: enoxaparin (LOVENOX) injection  70 mg Subcutaneous Q12H  . DISCONTD: ipratropium  0.5 mg Nebulization Q6H    Assessment: 77yo male with afib is currently on full dose lovenox.  Wt 73.9kg Goal of Therapy:  4hr heparin level 0.6-1.2   Plan:  1) Change lovenox to 75mg  sq bid; 2) CBC q72h  Alysen Smylie, Tsz-Yin 09/19/2011,12:48 PM

## 2011-09-20 LAB — COMPREHENSIVE METABOLIC PANEL
Alkaline Phosphatase: 86 U/L (ref 39–117)
BUN: 40 mg/dL — ABNORMAL HIGH (ref 6–23)
Calcium: 9.7 mg/dL (ref 8.4–10.5)
Creatinine, Ser: 0.92 mg/dL (ref 0.50–1.35)
GFR calc Af Amer: 87 mL/min — ABNORMAL LOW (ref >90)
Glucose, Bld: 86 mg/dL (ref 70–99)
Potassium: 4 mEq/L (ref 3.5–5.1)
Total Protein: 7 g/dL (ref 6.0–8.3)

## 2011-09-20 LAB — GLUCOSE, CAPILLARY
Glucose-Capillary: 145 mg/dL — ABNORMAL HIGH (ref 70–99)
Glucose-Capillary: 136 mg/dL — ABNORMAL HIGH (ref 70–99)

## 2011-09-20 LAB — CBC
HCT: 35.7 % — ABNORMAL LOW (ref 39.0–52.0)
Hemoglobin: 11.6 g/dL — ABNORMAL LOW (ref 13.0–17.0)
MCH: 26.7 pg (ref 26.0–34.0)
MCHC: 32.5 g/dL (ref 30.0–36.0)
MCV: 82.3 fL (ref 78.0–100.0)

## 2011-09-20 MED ORDER — LOSARTAN POTASSIUM 25 MG PO TABS: 12.5000 mg | ORAL_TABLET | Freq: Every day | ORAL | Status: DC

## 2011-09-20 MED ORDER — ASPIRIN 81 MG PO TBEC: 81.0000 mg | DELAYED_RELEASE_TABLET | Freq: Every day | ORAL | Status: DC

## 2011-09-20 MED ORDER — STARCH (THICKENING) PO POWD: 1.0000 | ORAL | Status: DC | PRN

## 2011-09-20 MED ORDER — FUROSEMIDE 20 MG PO TABS: 20.0000 mg | ORAL_TABLET | Freq: Every day | ORAL | Status: DC

## 2011-09-20 MED ORDER — POTASSIUM CHLORIDE CRYS ER 20 MEQ PO TBCR: 20.0000 meq | EXTENDED_RELEASE_TABLET | Freq: Two times a day (BID) | ORAL | Status: DC

## 2011-09-20 MED ORDER — ACETAMINOPHEN 325 MG PO TABS: 650.0000 mg | ORAL_TABLET | Freq: Four times a day (QID) | ORAL | Status: AC | PRN

## 2011-09-20 NOTE — Progress Notes (Signed)
Subjective: Patient seen.Denies any complaints.Awaiting placement at [facility] nursing facility  Objective: Weight change:   Intake/Output Summary (Last 24 hours) at 09/20/11 0813 Last data filed at 09/20/11 0807  Gross per 24 hour  Intake    720 ml  Output   1750 ml  Net  -1030 ml   BP 108/63  Pulse 58  Temp(Src) 97.3 F (36.3 C) (Oral)  Resp 18  Ht 6\' 1"  (1.854 m)  Wt 69.763 kg (153 lb 12.8 oz)  BMI 20.29 kg/m2  SpO2 94% Physical Exam: General appearance: alert, cooperative and no distress Head: Normocephalic, without obvious abnormality, atraumatic Neck: no adenopathy, no carotid bruit, no JVD, supple, symmetrical, trachea midline and thyroid not enlarged, symmetric, no tenderness/mass/nodules Lungs: clear to auscultation bilaterally Heart: regular rate and rhythm, S1, S2 normal, no murmur, click, rub or gallop Abdomen: soft, non-tender; bowel sounds normal; no masses,  no organomegaly Extremities: extremities normal, atraumatic, no cyanosis or edema Skin: Skin color, texture, turgor normal. No rashes or lesions  Lab Results: @labtest @  Micro Results: No results found for this or any previous visit (from the past 240 hour(s)).  Studies/Results: Dg Chest 1 View  09/18/2011  *RADIOLOGY REPORT*  Clinical Data: Pneumonia, SOB, cough  CHEST - 1 VIEW  Comparison: 09/12/2011  Findings: Mild right upper lobe opacity, suspicious for pneumonia, improved.  Mild bibasilar opacities, possibly atelectasis. No pleural effusion or pneumothorax.  The heart is top normal in size.  Degenerative changes of the visualized thoracolumbar spine.  IMPRESSION: Mild right upper lobe opacity, suspicious for pneumonia, improved.  Bibasilar opacities, possibly atelectasis.  Original Report Authenticated By: Charline Bills, M.D.   Dg Chest Portable 1 View  09/12/2011  *RADIOLOGY REPORT*  Clinical Data: Shortness of breath post extubation  PORTABLE CHEST - 1 VIEW  Comparison: 09/11/2011;  09/10/2011; 09/09/2011  Findings:  Unchanged cardiac silhouette and mediastinal contours with persistent obscuration of the right heart border secondary to consolidative opacity.  Interval increase in left-sided retrocardiac consolidative opacities.  Stable position of support apparatus.  No definite pneumothorax.  Blunting of bilateral costophrenic angles may suggest small bilateral effusions. Unchanged increased interstitial markings within the right upper lung.  Unchanged bones.  IMPRESSION: 1.  No change to minimal increase in left basilar consolidative opacities with findings of partial atelectasis/collapse of the bilateral lower lungs. 2.  Unchanged heterogeneous opacities within the right upper lung, possibly infectious in etiology, though asymmetric pulmonary edema may have a similar appearance.  Original Report Authenticated By: Waynard Reeds, M.D.   Dg Chest Portable 1 View  09/11/2011  *RADIOLOGY REPORT*  Clinical Data: Check ET tube placement  PORTABLE CHEST - 1 VIEW  Comparison: 09/10/2011  Findings: The endotracheal tube has been removed.  There is a right IJ catheter with tip in the SVC.  Pleural effusions and interstitial edema are unchanged from previous exam.  Right upper lobe and bibasilar opacities are stable.  IMPRESSION:  1.  No change in aeration to the lungs compared with previous exam. 2.  Status post extubation.  Original Report Authenticated By: Rosealee Albee, M.D.   Dg Chest Portable 1 View  09/10/2011  *RADIOLOGY REPORT*  Clinical Data: Evaluate ET tube placement  PORTABLE CHEST - 1 VIEW  Comparison: 09/09/2011  Findings: The endotracheal tube tip is above the carina.  Right IJ catheter tip is in the SVC.  The heart size is mildly enlarged.  Persistent low lung volumes with small effusions and mild edema. There has been improvement in  right upper lobe airspace opacity.  IMPRESSION:  1.  Improvement in aeration to the right upper lobe. 2.  Persistent low lung volumes and small  effusions/edema.  Original Report Authenticated By: Rosealee Albee, M.D.   Dg Chest Portable 1 View  09/09/2011  *RADIOLOGY REPORT*  Clinical Data: Pneumonia, shortness of breath.  PORTABLE CHEST - 1 VIEW  Comparison: 09/08/2011  Findings: Interval placement NG tube in the stomach.  Support devices otherwise stable.  Patchy bilateral airspace disease, most pronounced in the right upper lobe and lung bases has increased slightly since prior study.  Mild cardiomegaly.  No definite effusions.  IMPRESSION: Increasing patchy bilateral airspace disease.  Original Report Authenticated By: Cyndie Chime, M.D.   Dg Chest Portable 1 View  09/08/2011  *RADIOLOGY REPORT*  Clinical Data: Post right IJ line placement  PORTABLE CHEST - 1 VIEW  Comparison: 08/08/2011; 09/06/2011  Findings: Grossly unchanged cardiac silhouette and mediastinal contour.  Interval intubation with endotracheal tube overlying tracheal air column with tip supra to the carina.  Interval placement of right jugular approach into venous catheter with tip overlying the superior cavoatrial junction.  No pneumothorax.  No change to minimal improved aeration of the right upper lung with persistent right upper lung heterogeneous opacities. Grossly unchanged left perihilar and basilar heterogeneous opacities, possibly atelectasis.  No new focal airspace opacities.  No definite pleural effusion.  Grossly unchanged bones.  IMPRESSION:     1.    Appropriately positioned support apparatus as above.  No       pneumothorax.        2.    No change to minimally improved aeration of the right upper lung with persistent heterogeneous opacities worrisome for infection. Original Report Authenticated By: Waynard Reeds, M.D.   Dg Chest Portable 1 View  09/08/2011  *RADIOLOGY REPORT*  Clinical Data: Shortness of breath, pneumonia.  PORTABLE CHEST - 1 VIEW  Comparison: 09/06/2011  Findings: Patchy airspace disease in the right upper lobe compatible with pneumonia.   This is increased since prior study. Continued patchy opacity in the left upper lobe.  Heart is enlarged.  No effusions.  IMPRESSION: Worsening consolidation in the right upper lobe compatible with worsening pneumonia.  Stable patchy left upper lobe opacity.  Original Report Authenticated By: Cyndie Chime, M.D.   Dg Chest Portable 1 View  09/06/2011  *RADIOLOGY REPORT*  Clinical Data: Shortness of breath, chest pain, some difficulty breathing  PORTABLE CHEST - 1 VIEW  Comparison: Portable chest x-ray of 06/18/2011  Findings: There is poorly defined airspace disease right greater than left.  Pneumonia cannot be excluded with edema less likely in view of the asymmetry and peripheral distribution.  Cardiomegaly is stable.  No effusion is seen.  IMPRESSION: Patchy airspace disease bilaterally.  Favor multifocal pneumonia. Recommend follow-up two-view chest x-ray  Original Report Authenticated By: Juline Patch, M.D.   Dg Swallowing Func-no Report  09/13/2011  CLINICAL DATA: r/o aspiration   FLUOROSCOPY FOR SWALLOWING FUNCTION STUDY:  Fluoroscopy was provided for swallowing function study, which was  administered by a speech pathologist.  Final results and recommendations  from this study are contained within the speech pathology report.     Medications: Scheduled Meds:   . aspirin EC  81 mg Oral Daily  . enoxaparin (LOVENOX) injection  75 mg Subcutaneous Q12H  . furosemide  20 mg Oral Daily  . losartan  12.5 mg Oral Daily  . potassium chloride  20 mEq Oral BID  .  predniSONE  30 mg Oral QAC breakfast  . simvastatin  10 mg Oral q1800  . sodium chloride  10 mL Intravenous Q12H  . DISCONTD: enoxaparin (LOVENOX) injection  70 mg Subcutaneous Q12H   Continuous Infusions:   . sodium chloride 10 mL/hr at 09/16/11 2000   PRN Meds:.acetaminophen, diazepam, food thickener, ipratropium, levalbuterol, morphine injection, ondansetron (ZOFRAN) IV, oxyCODONE, sodium chloride  Assessment/Plan: #1 VDRF s/p  extubation-patient stable and tolerating oxygen via nasal canulla #2 Pneumonia-resolved #3 Sepsis-resolved #4 NSTEMI-will continue medical management #5 Atrial fibrillation-rate is controlled #6 Copd-stable #7 HTN-BP stable #8 Hyperlipidemia Patient is stable.Awaiting placement.   LOS: 14 days   Brecklynn Jian 09/20/2011, 8:13 AM

## 2011-09-20 NOTE — Progress Notes (Signed)
CSW faxed dc summary and medications to SNF. CSW prepared dc packet. CSW informed patient and RN of dc. CSW coordinated transportation via [employer]. CSW is signing off.

## 2011-09-20 NOTE — Progress Notes (Signed)
Physical Therapy Treatment Patient Details Name: SHIQUAN MATHIEU MRN: [id number] DOB: [birth year] Today's Date: 09/20/2011  PT Assessment/Plan  PT - Assessment/Plan PT Plan: Discharge plan remains appropriate Follow Up Recommendations: Skilled nursing facility Equipment Recommended: Defer to next venue PT Goals  Acute Rehab PT Goals PT Goal: Supine/Side to Sit - Progress: Met Pt will Transfer Sit to Stand/Stand to Sit: Other (comment) ( min guard A) PT Transfer Goal: Sit to Stand/Stand to Sit - Progress: Progressing toward goal PT Transfer Goal: Bed to Chair/Chair to Bed - Progress:  (not addressed today) PT Goal: Ambulate - Progress: Progressing toward goal PT Goal: Up/Down Stairs - Progress:  (not addressed today)  PT Treatment Precautions/Restrictions  Precautions Precautions: Fall Required Braces or Orthoses: No Restrictions Weight Bearing Restrictions: No Mobility (including Balance) Bed Mobility Bed Mobility: Yes Supine to Sit: 7: Independent Sitting - Scoot to Edge of Bed: 7: Independent Transfers Transfers: Yes Sit to Stand: Other (comment);From bed;With upper extremity assist (min guard) Sit to Stand Details (indicate cue type and reason): vc's for hand placement Stand to Sit: 5: Supervision Ambulation/Gait Ambulation/Gait: Yes Ambulation/Gait Assistance: Other (comment) (min guard A) Ambulation/Gait Assistance Details (indicate cue type and reason): vc for postural checks; standing closer to RW Ambulation Distance (Feet): 260 Feet Assistive device: Rolling walker Gait Pattern: Within Functional Limits;Step-through pattern;Trunk flexed    Exercise    End of Session PT - End of Session Activity Tolerance: Patient limited by fatigue Patient left: in chair Nurse Communication: Mobility status for ambulation General Behavior During Session: [facility] for tasks performed  Zaire Levesque, Eliseo Gum 09/20/2011, 12:10 PM  09/20/2011  [place],  PT [phone] (pager)

## 2011-09-20 NOTE — Discharge Summary (Signed)
DISCHARGE SUMMARY Patient is an 77 year old Caucasian male with history of asthma/COPD was admitted to the hospital on 09/06/2011 by Dr. Cleotis Lema with complains of cough and shortness of breath. He was also said to be desaturating and hypotensive .No history of fever or chills .Examination  showed patient to be in respiratory distress with positive cardiac markers. Patient management was taken over by PCCM . He was intubated and mechanically ventilated. He was treated for aspiration pneumonia with IV Zosyn and medically managed for the non-ST elevated MI .Patient was subsequently extubated and thereafter transferred to the services of the hospitalists service  DONTAVION NOXON  MR#: [id number]  DOB:09-12-1924  Date of Admission: 09/06/2011 Date of Discharge: 09/20/2011  Attending Physician:Kamsiyochukwu Spickler  Patient's WUJ:WJXBJYN,WGNFAO TOM, MD, MD  Consults: pccm  Discharge Diagnoses: 1. Vent Dependent Respiratory failure s/p Extubation 2. Pneumonia 3. Sepsis 4. NSTEMI 5. Atrial fibrillation 6. Hypertension 7. Hyperlipidemia 8. Copd 9. Deconditioning     Current Discharge Medication List    START taking these medications   Details  acetaminophen (TYLENOL) 325 MG tablet Take 2 tablets (650 mg total) by mouth every 6 (six) hours as needed (or Fever >/= 101). Qty: 30 tablet    aspirin EC 81 MG EC tablet Take 1 tablet (81 mg total) by mouth daily. Qty: 30 tablet, Refills: 1    food thickener (THICK IT) POWD Take 227 g by mouth as needed (pt needs liquids thickened to keep at bedside). Qty: 1 Can, Refills: 1    furosemide (LASIX) 20 MG tablet Take 1 tablet (20 mg total) by mouth daily. Qty: 30 tablet, Refills: 1    losartan (COZAAR) 25 MG tablet Take 0.5 tablets (12.5 mg total) by mouth daily. Qty: 30 tablet, Refills: 1    potassium chloride SA (K-DUR,KLOR-CON) 20 MEQ tablet Take 1 tablet (20 mEq total) by mouth 2 (two) times daily. Qty: 30 tablet, Refills: 1        CONTINUE these medications which have NOT CHANGED   Details  albuterol (PROVENTIL HFA;VENTOLIN HFA) 108 (90 BASE) MCG/ACT inhaler Inhale 2 puffs into the lungs every 6 (six) hours as needed.     amLODipine (NORVASC) 5 MG tablet Take 5 mg by mouth daily.      candesartan-hydrochlorothiazide (ATACAND HCT) 32-12.5 MG per tablet Take 1 tablet by mouth daily.      dabigatran (PRADAXA) 150 MG CAPS Take 150 mg by mouth every 12 (twelve) hours.     Fluticasone-Salmeterol (ADVAIR) 500-50 MCG/DOSE AEPB Inhale 1 puff into the lungs every 12 (twelve) hours.     Multiple Vitamins-Minerals (MULTIVITAMINS THER. W/MINERALS) TABS Take 1 tablet by mouth daily.     pravastatin (PRAVACHOL) 20 MG tablet Take 20 mg by mouth daily.      fluticasone (FLONASE) 50 MCG/ACT nasal spray Place 2 sprays into the nose daily.       STOP taking these medications     lip balm (CARMEX) ointment           Hospital Course: Patient management was taken over by PCCM. He was intubated and mechanically ventilated.Patient was treated for aspiration pneumonia with IV Zosyn and he was also given nebulizer treatment .He wason  medically managed for the non-ST elevated MI. Patient was successfully extubated and subsequently transferred to services of the triad hospitalist.Patient was followed by me post extubation . He made remarkable improvement and no major changes made  patient management.He also to and had physical therapy done which he tolerated. Patient has remain medically  stable and plan is for patient to be discharged to [facility] facility for rehabilitation.   Day of Discharge BP 108/63  Pulse 58  Temp(Src) 97.3 F (36.3 C) (Oral)  Resp 18  Ht 6\' 1"  (1.854 m)  Wt 69.763 kg (153 lb 12.8 oz)  BMI 20.29 kg/m2  SpO2 94%  Physical Exam: Hennt-no pallor Neck-no jvd Chest-clear CVS-s1 and s2 Abd-soft.no organs palpable and bowel sounds present. Ext-no pedal edema Neuro-non focal  Results for orders placed  during the hospital encounter of 09/06/11 (from the past 24 hour(s))  GLUCOSE, CAPILLARY     Status: Abnormal   Collection Time   09/19/11  4:45 PM      Component Value Range   Glucose-Capillary 145 (*) 70 - 99 (mg/dL)  GLUCOSE, CAPILLARY     Status: Abnormal   Collection Time   09/19/11  9:03 PM      Component Value Range   Glucose-Capillary 122 (*) 70 - 99 (mg/dL)  GLUCOSE, CAPILLARY     Status: Abnormal   Collection Time   09/20/11  5:43 AM      Component Value Range   Glucose-Capillary 106 (*) 70 - 99 (mg/dL)  CBC     Status: Abnormal   Collection Time   09/20/11  9:38 AM      Component Value Range   WBC 12.9 (*) 4.0 - 10.5 (K/uL)   RBC 4.34  4.22 - 5.81 (MIL/uL)   Hemoglobin 11.6 (*) 13.0 - 17.0 (g/dL)   HCT 16.1 (*) 09.6 - 52.0 (%)   MCV 82.3  78.0 - 100.0 (fL)   MCH 26.7  26.0 - 34.0 (pg)   MCHC 32.5  30.0 - 36.0 (g/dL)   RDW 04.5 (*) 40.9 - 15.5 (%)   Platelets 446 (*) 150 - 400 (K/uL)  COMPREHENSIVE METABOLIC PANEL     Status: Abnormal   Collection Time   09/20/11  9:38 AM      Component Value Range   Sodium 144  135 - 145 (mEq/L)   Potassium 4.0  3.5 - 5.1 (mEq/L)   Chloride 104  96 - 112 (mEq/L)   CO2 28  19 - 32 (mEq/L)   Glucose, Bld 86  70 - 99 (mg/dL)   BUN 40 (*) 6 - 23 (mg/dL)   Creatinine, Ser 8.11  0.50 - 1.35 (mg/dL)   Calcium 9.7  8.4 - 91.4 (mg/dL)   Total Protein 7.0  6.0 - 8.3 (g/dL)   Albumin 3.2 (*) 3.5 - 5.2 (g/dL)   AST 20  0 - 37 (U/L)   ALT 26  0 - 53 (U/L)   Alkaline Phosphatase 86  39 - 117 (U/L)   Total Bilirubin 0.4  0.3 - 1.2 (mg/dL)   GFR calc non Af Amer 75 (*) >90 (mL/min)   GFR calc Af Amer 87 (*) >90 (mL/min)    Disposition: stable   Follow-up Appts: Discharge Orders    Future Appointments: Provider: Department: Dept Phone: Center:   10/28/2011 3:00 PM Pricilla Riffle, MD [facility] [phone] [id number]     Future Orders Please Complete By Expires   Diet - low sodium heart healthy      Increase activity  slowly         Follow-up with primary care physician in 1-2 weeks  Signed: Dustyn Armbrister 09/20/2011, 11:19 AM

## 2011-09-20 NOTE — Progress Notes (Signed)
CSW was informed by MD that patient could dc. CSW spoke with patient who still requested [facility]. CSW called SNF who has a bed available and agreeable to patient admitting today. CSW called patient's dtr who stated she would call SNF and complete paperwork. CSW text paged MD regarding SNF bed being available. CSW will continue to follow to assist with dc needs.

## 2011-10-21 ENCOUNTER — Ambulatory Visit: Payer: Medicare Other | Admitting: Internal Medicine

## 2011-10-28 ENCOUNTER — Ambulatory Visit (INDEPENDENT_AMBULATORY_CARE_PROVIDER_SITE_OTHER): Payer: Medicare Other | Admitting: Internal Medicine

## 2011-10-28 ENCOUNTER — Encounter: Payer: Self-pay | Admitting: Internal Medicine

## 2011-10-28 DIAGNOSIS — I4891 Unspecified atrial fibrillation: Secondary | ICD-10-CM

## 2011-10-28 DIAGNOSIS — I509 Heart failure, unspecified: Secondary | ICD-10-CM

## 2011-10-28 DIAGNOSIS — R0602 Shortness of breath: Secondary | ICD-10-CM

## 2011-10-28 DIAGNOSIS — I10 Essential (primary) hypertension: Secondary | ICD-10-CM

## 2011-10-28 DIAGNOSIS — E785 Hyperlipidemia, unspecified: Secondary | ICD-10-CM

## 2011-10-28 DIAGNOSIS — I5032 Chronic diastolic (congestive) heart failure: Secondary | ICD-10-CM | POA: Insufficient documentation

## 2011-10-28 HISTORY — DX: Heart failure, unspecified: I50.9

## 2011-10-28 NOTE — Assessment & Plan Note (Signed)
Volume status looks pretty good.  Will check labs.  Repeat echo to reeval LVEF.

## 2011-10-28 NOTE — Assessment & Plan Note (Signed)
Keep on pravastatin. 

## 2011-10-28 NOTE — Assessment & Plan Note (Signed)
Keep on rate control and Xarelto.  Check CBC.

## 2011-10-28 NOTE — Patient Instructions (Addendum)
Lab work today. We will call you with results.  STOP Cozaar  Your physician has requested that you have an echocardiogram. Echocardiography is a painless test that uses sound waves to create images of your heart. It provides your doctor with information about the size and shape of your heart and how well your heart's chambers and valves are working. This procedure takes approximately one hour. There are no restrictions for this procedure.  Your physician wants you to follow-up in MAY 2013 You will receive a reminder letter in the mail two months in advance. If you don't receive a letter, please call our office to schedule the follow-up appointment.

## 2011-10-28 NOTE — Progress Notes (Signed)
HPI  Patient is an 77 year old who was previously followed by S. Deborah Chalk  He has a history of atrial fibrillation, aortic stenosis, HTN and dyslipidemia. He was recently admitted with an aspiration pneumonia.  Treated with IV Abx and mech ventilation.  He suffereed an NSTEMI  Peak trop 2.76.  Pek CK/B was 276/13.8.  Echo showed LVEF was 20 t o25%.    No Known Allergies  Current Outpatient Prescriptions  Medication Sig Dispense Refill  . amLODipine (NORVASC) 5 MG tablet Take 5 mg by mouth daily.        Marland Kitchen atorvastatin (LIPITOR) 40 MG tablet Take 40 mg by mouth daily.        . candesartan-hydrochlorothiazide (ATACAND HCT) 32-12.5 MG per tablet Take 1 tablet by mouth daily.        . eszopiclone (LUNESTA) 2 MG TABS Take 2 mg by mouth at bedtime. Take immediately before bedtime       . fluticasone (FLONASE) 50 MCG/ACT nasal spray Place 2 sprays into the nose daily.       . Fluticasone-Salmeterol (ADVAIR) 500-50 MCG/DOSE AEPB Inhale 1 puff into the lungs every 12 (twelve) hours.       . food thickener (THICK IT) POWD Take 227 g by mouth as needed (pt needs liquids thickened to keep at bedside).  1 Can  1  . furosemide (LASIX) 20 MG tablet Take 1 tablet (20 mg total) by mouth daily.  30 tablet  1  . losartan (COZAAR) 25 MG tablet Take 0.5 tablets (12.5 mg total) by mouth daily.  30 tablet  1  . Multiple Vitamins-Minerals (MULTIVITAMINS THER. W/MINERALS) TABS Take 1 tablet by mouth daily.       . pravastatin (PRAVACHOL) 20 MG tablet Take 20 mg by mouth daily.        . Rivaroxaban (XARELTO) 20 MG TABS Take 20 mg by mouth.          Past Medical History  Diagnosis Date  . Asthma   . Hypertension   . Diabetes mellitus   . Cancer   . A-fib   . Hyperlipidemia   . Colon cancer   . Shortness of breath     Past Surgical History  Procedure Date  . Colon surgery     Partial hemecolectomy   . Central line insertion 09/08/2011         No family history on file.  History   Social History  .  Marital Status: Married    Spouse Name: N/A    Number of Children: N/A  . Years of Education: N/A   Occupational History  . Not on file.   Social History Main Topics  . Smoking status: Former Games developer  . Smokeless tobacco: Not on file  . Alcohol Use: No  . Drug Use: No  . Sexually Active:    Other Topics Concern  . Not on file   Social History Narrative  . No narrative on file    Review of Systems:  All systems reviewed.  They are negative to the above problem except as previously stated.  Vital Signs: BP 122/56  Pulse 75  Temp 97.5 F (36.4 C)  Ht 6' (1.829 m)  Wt 161 lb 1.9 oz (73.084 kg)  BMI 21.85 kg/m2  Physical Exam Patient is in NAD  HEENT:  Normocephalic, atraumatic. EOMI, PERRLA.  Neck: JVP is normal. No thyromegaly. No bruits.  Lungs: clear to auscultation. No rales no wheezes.  Heart: Regular rate and rhythm.  Normal S1, S2. No S3.   No significant murmurs. PMI not displaced.  Abdomen:  Supple, nontender. Normal bowel sounds. No masses. No hepatomegaly.  Extremities:   Good distal pulses throughout. No lower extremity edema.  Musculoskeletal :moving all extremities.  Neuro:   alert and oriented x3.  CN II-XII grossly intact.  EKG:  Atrial fibrillation  80 bpm.  Nonspecific ST T wave changes.  Assessment and Plan:

## 2011-10-28 NOTE — Assessment & Plan Note (Signed)
BP is good.  He is on 2 ARBs.  I would recomm that he stop Cozaar since he is on a low dose.  Continue Atacand/HCTZ.

## 2011-10-29 LAB — BASIC METABOLIC PANEL
BUN: 30 mg/dL — ABNORMAL HIGH (ref 6–23)
Calcium: 9.4 mg/dL (ref 8.4–10.5)
Creatinine, Ser: 1.5 mg/dL (ref 0.4–1.5)
GFR: 47.97 mL/min — ABNORMAL LOW (ref >60.00)
Glucose, Bld: 112 mg/dL — ABNORMAL HIGH (ref 70–99)
Potassium: 4.4 mEq/L (ref 3.5–5.1)

## 2011-10-29 LAB — CBC WITH DIFFERENTIAL/PLATELET
Basophils Relative: 0.7 % (ref 0.0–3.0)
Eosinophils Absolute: 0.9 10*3/uL — ABNORMAL HIGH (ref 0.0–0.7)
Eosinophils Relative: 10.1 % — ABNORMAL HIGH (ref 0.0–5.0)
HCT: 29 % — ABNORMAL LOW (ref 39.0–52.0)
Lymphs Abs: 2 10*3/uL (ref 0.7–4.0)
MCHC: 32.6 g/dL (ref 30.0–36.0)
MCV: 83.5 fl (ref 78.0–100.0)
Monocytes Absolute: 1.1 10*3/uL — ABNORMAL HIGH (ref 0.1–1.0)
Neutrophils Relative %: 52.2 % (ref 43.0–77.0)
RBC: 3.48 Mil/uL — ABNORMAL LOW (ref 4.22–5.81)

## 2011-10-29 LAB — BRAIN NATRIURETIC PEPTIDE: Pro B Natriuretic peptide (BNP): 172 pg/mL — ABNORMAL HIGH (ref 0.0–100.0)

## 2011-11-09 ENCOUNTER — Ambulatory Visit (HOSPITAL_COMMUNITY): Payer: Medicare Other | Attending: Cardiology | Admitting: Radiology

## 2011-11-09 DIAGNOSIS — R079 Chest pain, unspecified: Secondary | ICD-10-CM | POA: Insufficient documentation

## 2011-11-09 DIAGNOSIS — I4891 Unspecified atrial fibrillation: Secondary | ICD-10-CM

## 2011-11-09 DIAGNOSIS — I10 Essential (primary) hypertension: Secondary | ICD-10-CM | POA: Insufficient documentation

## 2012-01-21 ENCOUNTER — Encounter: Payer: Self-pay | Admitting: *Deleted

## 2012-03-13 ENCOUNTER — Ambulatory Visit: Payer: Medicare Other | Admitting: Internal Medicine

## 2012-08-16 ENCOUNTER — Ambulatory Visit: Payer: Medicare Other | Admitting: Physician Assistant

## 2012-08-23 ENCOUNTER — Encounter: Payer: Self-pay | Admitting: Physician Assistant

## 2012-08-23 ENCOUNTER — Ambulatory Visit (INDEPENDENT_AMBULATORY_CARE_PROVIDER_SITE_OTHER): Payer: Medicare Other | Admitting: Physician Assistant

## 2012-08-23 VITALS — BP 141/74 | HR 83 | Ht 72.0 in | Wt 153.0 lb

## 2012-08-23 DIAGNOSIS — I10 Essential (primary) hypertension: Secondary | ICD-10-CM

## 2012-08-23 DIAGNOSIS — I4891 Unspecified atrial fibrillation: Secondary | ICD-10-CM

## 2012-08-23 DIAGNOSIS — J189 Pneumonia, unspecified organism: Secondary | ICD-10-CM

## 2012-08-23 DIAGNOSIS — I509 Heart failure, unspecified: Secondary | ICD-10-CM

## 2012-08-23 DIAGNOSIS — E785 Hyperlipidemia, unspecified: Secondary | ICD-10-CM

## 2012-08-23 NOTE — Patient Instructions (Addendum)
Continue medications as directed  Your physician wants you to follow-up in: 1 year with Dr. Tenny Craw. You will receive a reminder letter in the mail two months in advance. If you don't receive a letter, please call our office to schedule the follow-up appointment.  Your physician recommends that you have lab work today: CBC, BMP.  We will call you with your lab results.

## 2012-08-23 NOTE — Assessment & Plan Note (Signed)
Pneumonia last year requiring intubation. Recommend Pneumovax.

## 2012-08-23 NOTE — Progress Notes (Signed)
HPI:  This is an 78 year old male patient that has a history of atrial fibrillation on Xarelto, mild AI and mild MR, HTN and dyslipidemia.  He had an admission November 2012 with an aspiration pneumonia treated with IV Abx and mech ventilation.  He suffered an NSTEMI  Peak trop 2.76.  Pek CK/B was 276/13.8.  Echo 09/08/11 showed LVEF was 20 t o25%.  follow up 2-D echo on 11/09/11 showed normalization of ejection fraction EF 65% with no wall motion abnormality.  Patient is doing extremely well. He mows his lawn and lives with his wife who is 59 years old. They have a caregiver that comes in 7 hours a day to help with the housework and cooking. He says he does anything he wants to do. He denies any chest pain, palpitations, dyspnea, dyspnea on exertion, dizziness, or presyncope.  No Known Allergies  Current Outpatient Prescriptions on File Prior to Visit: amLODipine (NORVASC) 5 MG tablet, Take 5 mg by mouth daily.  , Disp: , Rfl:   atorvastatin (LIPITOR) 40 MG tablet, Take 40 mg by mouth daily.  , Disp: , Rfl:  candesartan-hydrochlorothiazide (ATACAND HCT) 32-12.5 MG per tablet, Take 1 tablet by mouth daily.  , Disp: , Rfl:  eszopiclone (LUNESTA) 2 MG TABS, Take 2 mg by mouth at bedtime. Take immediately before bedtime , Disp: , Rfl:  fluticasone (FLONASE) 50 MCG/ACT nasal spray, Place 2 sprays into the nose daily. , Disp: , Rfl:  Fluticasone-Salmeterol (ADVAIR) 500-50 MCG/DOSE AEPB, Inhale 1 puff into the lungs every 12 (twelve) hours. , Disp: , Rfl:  food thickener (THICK IT) POWD, Take 227 g by mouth as needed (pt needs liquids thickened to keep at bedside)., Disp: 1 Can, Rfl: 1 furosemide (LASIX) 20 MG tablet, Take 1 tablet (20 mg total) by mouth daily., Disp: 30 tablet, Rfl: 1 Multiple Vitamins-Minerals (MULTIVITAMINS THER. W/MINERALS) TABS, Take 1 tablet by mouth daily. , Disp: , Rfl:  pravastatin (PRAVACHOL) 20 MG tablet, Take 20 mg by mouth daily.  , Disp: , Rfl:  Rivaroxaban (XARELTO) 20 MG  TABS, Take 20 mg by mouth.  , Disp: , Rfl:     Past Medical History:   Asthma                                                       Hypertension                                                 Diabetes mellitus                                            Cancer                                                       A-fib  Hyperlipidemia                                               Colon cancer                                                 Shortness of breath                                          CHF (congestive heart failure)                  10/28/2011   Aortic stenosis                                                Comment:Mild   Forgetfulness                                                PVC's (premature ventricular contractions)                   Mild aortic insufficiency                                   Past Surgical History:   COLON SURGERY                                                  Comment:Partial hemecolectomy    CENTRAL LINE INSERTION                          09/08/2011      Comment:   Review of patient's family history indicates:   Hyperlipidemia                 Father                   Hypertension                   Father                   Heart disease                  Father                   Asthma                         Father                   Stroke  Father                   Heart failure                  Father                   Heart disease                  Mother                   Heart failure                  Mother                   Heart attack                   Sister                   Social History   Marital Status: Married             Spouse Name:                      Years of Education:                 Number of children:             Occupational History   None on file  Social History Main Topics   Smoking Status: Former Smoker                    Packs/Day:       Years:         Smokeless Status: Not on file                      Alcohol Use: No             Drug Use: No             Sexual Activity:                    Other Topics            Concern   None on file  Social History Narrative   None on file    WUJ:[id number] and walks with a cane, hard of hearing, otherwise see history of present illness otherwise negative   PHYSICAL EXAM: Elderly, in no acute distress. Neck: No JVD, HJR, Bruit, or thyroid enlargement  Lungs: No tachypnea, clear without wheezing, rales, or rhonchi  Cardiovascular:irregular irregular with 2/6 systolic murmur at the left sternal border and apex, no gallops, bruit, thrill, or heave.  Abdomen: BS normal. Soft without organomegaly, masses, lesions or tenderness.  Extremities: without cyanosis, clubbing or edema. Good distal pulses bilateral  SKin: Warm, no lesions or rashes   Musculoskeletal: No deformities  Neuro: no focal signs  BP 141/74  Pulse 83  Ht 6' (1.829 m)  Wt 153 lb (69.4 kg)  BMI 20.75 kg/m2    NFA:OZHYQM fibrillation with PVC's, old septal infarct  2Decho 11/09/11: Study Conclusions  - Left ventricle: The cavity size was normal. Wall thickness   was increased in a pattern of mild LVH. The estimated   ejection fraction was 65%. Wall motion was normal; there   were no regional wall motion abnormalities. - Aortic valve: Mild regurgitation. - Aorta: Aortic root is  mildly dilated. - Mitral valve: Mild regurgitation. - Left atrium: The atrium was moderately to severely   dilated. - Right ventricle: The cavity size was mildly dilated. - Right atrium: The atrium was mildly dilated.

## 2012-08-23 NOTE — Assessment & Plan Note (Signed)
It's been approximately a year since he had fasting lipid panel and LFTs. We will schedule

## 2012-08-23 NOTE — Assessment & Plan Note (Addendum)
Most recent echo 11/09/11-normal LV function. No problem with heart failure over the past year.

## 2012-08-23 NOTE — Assessment & Plan Note (Signed)
Rate controlled,stable on Xarelto

## 2012-08-23 NOTE — Assessment & Plan Note (Signed)
Stable

## 2013-01-29 ENCOUNTER — Telehealth: Payer: Self-pay | Admitting: Family Medicine

## 2013-01-29 MED ORDER — AMLODIPINE BESYLATE 5 MG PO TABS: 5.0000 mg | ORAL_TABLET | Freq: Every day | ORAL | Status: DC

## 2013-01-29 MED ORDER — LEVOTHYROXINE SODIUM 25 MCG PO TABS: 25.0000 ug | ORAL_TABLET | Freq: Every day | ORAL | Status: DC

## 2013-01-29 NOTE — Telephone Encounter (Signed)
rx refilled.

## 2013-02-01 ENCOUNTER — Telehealth: Payer: Self-pay | Admitting: Family Medicine

## 2013-02-01 NOTE — Telephone Encounter (Signed)
Dtr reports swelling in feet for about one week. Pt having problem with ambulating and feet very painful.  Larey Seat last few days on driveway.  Abrasion to leg up near knee.  Foot swelling started after that.??

## 2013-02-01 NOTE — Telephone Encounter (Signed)
Called spoke to dtr Stanton Kidney again.  Let her know we need to see patient.  Call at 8am to get same day appt slot.

## 2013-02-01 NOTE — Telephone Encounter (Signed)
Can we work him in tomorrow? 

## 2013-02-02 ENCOUNTER — Ambulatory Visit (INDEPENDENT_AMBULATORY_CARE_PROVIDER_SITE_OTHER): Payer: Medicare Other | Admitting: Family Medicine

## 2013-02-02 ENCOUNTER — Encounter: Payer: Self-pay | Admitting: Family Medicine

## 2013-02-02 VITALS — BP 130/60 | HR 74 | Temp 98.2°F | Resp 18 | Wt 155.0 lb

## 2013-02-02 DIAGNOSIS — IMO0002 Reserved for concepts with insufficient information to code with codable children: Secondary | ICD-10-CM

## 2013-02-02 DIAGNOSIS — S80812A Abrasion, left lower leg, initial encounter: Secondary | ICD-10-CM

## 2013-02-02 NOTE — Progress Notes (Signed)
Subjective:     Patient ID: Todd [name], male   DOB: [birth year], 79 y.o.   MRN: [id number]  HPI 2 days ago the patient fell landing and history of weight. He sustained a abrasion to his left anterior shin. Ever since then his left foot has been more swollen. His daughter is also concerned because he seems to be favoring that foot when he walks.  The patient adamantly denies that there is any pain in his leg or his foot.  He is walking with a cane which is his baseline.  Past Medical History  Diagnosis Date  . Asthma   . Hypertension   . Diabetes mellitus   . Cancer   . A-fib   . Hyperlipidemia   . Colon cancer   . Shortness of breath   . CHF (congestive heart failure) 10/28/2011  . Aortic stenosis     Mild  . Forgetfulness   . PVC's (premature ventricular contractions)   . Mild aortic insufficiency    Current Outpatient Prescriptions on File Prior to Visit  Medication Sig Dispense Refill  . amLODipine (NORVASC) 5 MG tablet Take 1 tablet (5 mg total) by mouth daily.  90 tablet  1  . Fluticasone-Salmeterol (ADVAIR) 500-50 MCG/DOSE AEPB Inhale 1 puff into the lungs every 12 (twelve) hours. Prn asthma attack      . levothyroxine (SYNTHROID, LEVOTHROID) 25 MCG tablet Take 1 tablet (25 mcg total) by mouth daily.  30 tablet  3  . pravastatin (PRAVACHOL) 20 MG tablet Take 20 mg by mouth daily.        . Rivaroxaban (XARELTO) 20 MG TABS Take 20 mg by mouth.         No current facility-administered medications on file prior to visit.     Review of Systems Review of systems is otherwise negative    Objective:   Physical Exam  Constitutional: He appears well-developed and well-nourished.  Cardiovascular: An irregularly irregular rhythm present.  Pulmonary/Chest: Effort normal and breath sounds normal.  Musculoskeletal:       Left ankle: He exhibits swelling. He exhibits normal range of motion, no ecchymosis, no deformity, no laceration and normal pulse. No tenderness. No lateral  malleolus, no medial malleolus, no AITFL, no CF ligament, no head of 5th metatarsal and no proximal fibula tenderness found. Achilles tendon normal. Achilles tendon exhibits no pain, no defect and normal Thompson's test results.       Legs:      Assessment:     Fall Abrasion to left shin Swelling in left foot without evidence of fracture or dislocation.    Plan:     As the patient to stay off the leg is much as possible. Him to elevate the leg when sitting. He can apply ice to the shin for swelling. Recommended he ambulate with a walker as opposed to a cane. Followup swelling is not improved next week. I think this is just a contusion and abrasion. There is no evidence of fracture or cellulitis.

## 2013-02-22 ENCOUNTER — Telehealth: Payer: Self-pay | Admitting: Family Medicine

## 2013-02-22 DIAGNOSIS — S99922A Unspecified injury of left foot, initial encounter: Secondary | ICD-10-CM

## 2013-02-22 NOTE — Telephone Encounter (Signed)
Lets get x-rays of the injured foot.

## 2013-02-22 NOTE — Telephone Encounter (Signed)
Daughter states it is left foot that was injured a month ago and continues to be bothersom.  Dr. Tanya Nones wants to get an xray.  Daughter aware.  Order for xray at [facility] put in.  Daughter told where to take patient.  Try to do today or in morning tomorrow.

## 2013-03-12 ENCOUNTER — Other Ambulatory Visit: Payer: Self-pay | Admitting: Family Medicine

## 2013-03-12 NOTE — Telephone Encounter (Signed)
Medication refilled per protocol. 

## 2013-03-23 ENCOUNTER — Encounter: Payer: Self-pay | Admitting: Cardiology

## 2013-05-07 ENCOUNTER — Other Ambulatory Visit: Payer: Self-pay | Admitting: Family Medicine

## 2013-05-29 ENCOUNTER — Other Ambulatory Visit: Payer: Self-pay | Admitting: Family Medicine

## 2013-06-04 ENCOUNTER — Other Ambulatory Visit: Payer: Self-pay | Admitting: Family Medicine

## 2013-06-04 NOTE — Telephone Encounter (Signed)
Medication refilled per protocol. 

## 2013-08-06 ENCOUNTER — Other Ambulatory Visit: Payer: Self-pay | Admitting: Family Medicine

## 2013-08-28 ENCOUNTER — Ambulatory Visit (INDEPENDENT_AMBULATORY_CARE_PROVIDER_SITE_OTHER): Payer: Medicare Other | Admitting: Family Medicine

## 2013-08-28 ENCOUNTER — Other Ambulatory Visit: Payer: Self-pay | Admitting: Family Medicine

## 2013-08-28 ENCOUNTER — Encounter: Payer: Self-pay | Admitting: Family Medicine

## 2013-08-28 VITALS — BP 132/70 | HR 96 | Temp 98.3°F | Resp 20 | Wt 156.0 lb

## 2013-08-28 DIAGNOSIS — Z23 Encounter for immunization: Secondary | ICD-10-CM

## 2013-08-28 DIAGNOSIS — R5381 Other malaise: Secondary | ICD-10-CM

## 2013-08-28 NOTE — Progress Notes (Signed)
Subjective:    Patient ID: Todd [name], male    DOB: 1924/07/17, 79 y.o.   MRN: [id number]  HPI  Patient is here today with his daughter. She is concerned because the patient is more lethargic a home. He tends to go sleep at 12:00 at night. He tends to awaken at 6 AM the following morning. Throughout the day he takes frequent catnaps. He seems to sleep all the time. He currently reports feeling tired and having no energy. The patient blames it on the fact that he's getting older. He states he feels well. He denies any fever. He denies any cough or shortness of breath. He denies any chest pain, orthopnea, or paroxysmal nocturnal dyspnea. He denies any snoring or other symptoms of sleep apnea. He denies any abdominal pain, constipation, or diarrhea. He denies any rashes, sores or skin infections. He denies any dysuria, hematuria, or frequency. He does have a past medical history of anemia and hypothyroidism.  Patient denies any depression. His son recently died in 06/13/23 although he seems to be handling the death well. His daughter denies any delirium or delusions or paranoia or hallucinations. Past Medical History  Diagnosis Date  . Asthma   . Hypertension   . Diabetes mellitus   . Cancer   . A-fib   . Hyperlipidemia   . Colon cancer   . Shortness of breath   . CHF (congestive heart failure) 10/28/2011  . Aortic stenosis     Mild  . Forgetfulness   . PVC's (premature ventricular contractions)   . Mild aortic insufficiency    Past Surgical History  Procedure Laterality Date  . Colon surgery      Partial hemecolectomy   . Central line insertion  09/08/2011        Current Outpatient Prescriptions on File Prior to Visit  Medication Sig Dispense Refill  . amLODipine (NORVASC) 5 MG tablet TAKE 1 TABLET EVERY DAY  90 tablet  1  . ferrous gluconate (FERGON) 325 MG tablet Take 325 mg by mouth at bedtime.      . fluticasone (FLONASE) 50 MCG/ACT nasal spray Place 2 sprays into the nose  daily. Prn allergy      . Fluticasone-Salmeterol (ADVAIR) 500-50 MCG/DOSE AEPB Inhale 1 puff into the lungs every 12 (twelve) hours. Prn asthma attack      . furosemide (LASIX) 20 MG tablet Take 20 mg by mouth daily.      Marland Kitchen levothyroxine (SYNTHROID, LEVOTHROID) 25 MCG tablet TAKE 1 TABLET EVERY DAY  30 tablet  3  . losartan (COZAAR) 50 MG tablet TAKE 1 TABLET EVERY DAY  30 tablet  5  . omeprazole (PRILOSEC) 40 MG capsule TAKE ONE CAPSULE EVERY DAY  30 capsule  12  . Potassium Chloride Crys CR (KLOR-CON M20 PO) 1/2 tab BID      . pravastatin (PRAVACHOL) 20 MG tablet Take 20 mg by mouth daily.        . Rivaroxaban (XARELTO) 20 MG TABS One by mouth daily with evening meal  90 tablet  1   No current facility-administered medications on file prior to visit.   No Known Allergies History   Social History  . Marital Status: Married    Spouse Name: N/A    Number of Children: N/A  . Years of Education: N/A   Occupational History  . Not on file.   Social History Main Topics  . Smoking status: Former Games developer  . Smokeless tobacco: Not on file  Comment: quit 30+ yrs ago  . Alcohol Use: No  . Drug Use: No  . Sexual Activity: Not on file   Other Topics Concern  . Not on file   Social History Narrative  . No narrative on file     Review of Systems  All other systems reviewed and are negative.       Objective:   Physical Exam  Vitals reviewed. Constitutional: He is oriented to person, place, and time. He appears well-developed and well-nourished. No distress.  HENT:  Right Ear: External ear normal.  Left Ear: External ear normal.  Nose: Nose normal.  Mouth/Throat: Oropharynx is clear and moist. No oropharyngeal exudate.  Eyes: Conjunctivae are normal. No scleral icterus.  Neck: Neck supple. No JVD present. No thyromegaly present.  Cardiovascular: Normal rate, regular rhythm and normal heart sounds.  Exam reveals no gallop and no friction rub.   No murmur  heard. Pulmonary/Chest: Effort normal. He has rales.  Abdominal: Soft. Bowel sounds are normal. He exhibits no distension and no mass. There is no tenderness. There is no rebound and no guarding.  Musculoskeletal: Normal range of motion. He exhibits no edema and no tenderness.  Lymphadenopathy:    He has no cervical adenopathy.  Neurological: He is alert and oriented to person, place, and time. He has normal reflexes. He displays normal reflexes. No cranial nerve deficit. He exhibits normal muscle tone. Coordination normal.  Skin: No rash noted. He is not diaphoretic.  Psychiatric: He has a normal mood and affect. His behavior is normal. Judgment and thought content normal.   Patient has chronic basilar crackles that are due to pulmonary fibrosis       Assessment & Plan:  1. Other malaise and fatigue Patient's exam today is normal. The I will check a CBC, CMP, TSH, urinalysis, and a vitamin B12 level. We discussed checking the patient for sleep apnea but did not wish to proceed with this at present time. I feel that he is likely experiencing an age related decline in his stamina. I believe that his behavior is normal for his age and his medical conditions. I tried to reassure the patient and the family. Followup immediately if symptoms change or worsen - CBC with Differential - COMPLETE METABOLIC PANEL WITH GFR - TSH - Urinalysis, Routine w reflex microscopic; Future - Vitamin B12  2. Need for prophylactic vaccination and inoculation against influenza - Flu Vaccine QUAD 36+ mos PF IM (Fluarix)

## 2013-08-29 ENCOUNTER — Other Ambulatory Visit: Payer: Medicare Other

## 2013-08-29 DIAGNOSIS — R5381 Other malaise: Secondary | ICD-10-CM

## 2013-08-29 LAB — CBC WITH DIFFERENTIAL/PLATELET
Basophils Absolute: 0 10*3/uL (ref 0.0–0.1)
Basophils Relative: 1 % (ref 0–1)
Eosinophils Absolute: 0.4 10*3/uL (ref 0.0–0.7)
Hemoglobin: 10.8 g/dL — ABNORMAL LOW (ref 13.0–17.0)
MCH: 29.8 pg (ref 26.0–34.0)
MCHC: 34.4 g/dL (ref 30.0–36.0)
Monocytes Relative: 17 % — ABNORMAL HIGH (ref 3–12)
Neutro Abs: 4.4 10*3/uL (ref 1.7–7.7)
Neutrophils Relative %: 60 % (ref 43–77)
Platelets: 440 10*3/uL — ABNORMAL HIGH (ref 150–400)
RDW: 13.7 % (ref 11.5–15.5)

## 2013-08-29 LAB — URINALYSIS, ROUTINE W REFLEX MICROSCOPIC
Glucose, UA: NEGATIVE mg/dL
Protein, ur: NEGATIVE mg/dL
Specific Gravity, Urine: 1.02 (ref 1.005–1.030)
Urobilinogen, UA: 0.2 mg/dL (ref 0.0–1.0)

## 2013-08-29 LAB — COMPLETE METABOLIC PANEL WITH GFR
AST: 21 U/L (ref 0–37)
Albumin: 3.5 g/dL (ref 3.5–5.2)
Alkaline Phosphatase: 76 U/L (ref 39–117)
GFR, Est Non African American: 56 mL/min — ABNORMAL LOW
Glucose, Bld: 122 mg/dL — ABNORMAL HIGH (ref 70–99)
Potassium: 4.3 mEq/L (ref 3.5–5.3)
Sodium: 137 mEq/L (ref 135–145)
Total Bilirubin: 0.5 mg/dL (ref 0.3–1.2)
Total Protein: 6.2 g/dL (ref 6.0–8.3)

## 2013-08-29 LAB — URINALYSIS, MICROSCOPIC ONLY
Crystals: NONE SEEN
Squamous Epithelial / LPF: NONE SEEN

## 2013-08-31 ENCOUNTER — Encounter: Payer: Self-pay | Admitting: Family Medicine

## 2013-09-03 ENCOUNTER — Other Ambulatory Visit: Payer: Self-pay | Admitting: Family Medicine

## 2013-10-01 ENCOUNTER — Other Ambulatory Visit: Payer: Self-pay | Admitting: Family Medicine

## 2013-10-01 NOTE — Telephone Encounter (Signed)
Medication refilled per protocol. 

## 2013-10-29 ENCOUNTER — Other Ambulatory Visit: Payer: Self-pay | Admitting: Family Medicine

## 2013-11-12 ENCOUNTER — Other Ambulatory Visit: Payer: Self-pay | Admitting: Family Medicine

## 2013-11-12 MED ORDER — POTASSIUM CHLORIDE CRYS ER 20 MEQ PO TBCR: 10.0000 meq | EXTENDED_RELEASE_TABLET | Freq: Two times a day (BID) | ORAL | Status: DC

## 2013-11-23 ENCOUNTER — Emergency Department (HOSPITAL_COMMUNITY)
Admission: EM | Admit: 2013-11-23 | Discharge: 2013-11-23 | Disposition: A | Discharge status: Nursing Home (Includes ICF) | Payer: Medicare Other | Source: Home / Self Care | Attending: Family Medicine | Admitting: Family Medicine

## 2013-11-23 ENCOUNTER — Encounter (HOSPITAL_COMMUNITY): Payer: Self-pay | Admitting: Emergency Medicine

## 2013-11-23 ENCOUNTER — Emergency Department (HOSPITAL_COMMUNITY): Payer: Medicare Other

## 2013-11-23 ENCOUNTER — Inpatient Hospital Stay (HOSPITAL_COMMUNITY)
Admission: EM | Admit: 2013-11-23 | Discharge: 2013-11-28 | DRG: 440 | Disposition: A | Discharge status: Home / Self Care | Payer: Medicare Other | Attending: Internal Medicine | Admitting: Internal Medicine

## 2013-11-23 DIAGNOSIS — Z823 Family history of stroke: Secondary | ICD-10-CM

## 2013-11-23 DIAGNOSIS — E119 Type 2 diabetes mellitus without complications: Secondary | ICD-10-CM | POA: Diagnosis present

## 2013-11-23 DIAGNOSIS — Z87891 Personal history of nicotine dependence: Secondary | ICD-10-CM

## 2013-11-23 DIAGNOSIS — R109 Unspecified abdominal pain: Secondary | ICD-10-CM

## 2013-11-23 DIAGNOSIS — H9190 Unspecified hearing loss, unspecified ear: Secondary | ICD-10-CM | POA: Diagnosis present

## 2013-11-23 DIAGNOSIS — E785 Hyperlipidemia, unspecified: Secondary | ICD-10-CM | POA: Diagnosis present

## 2013-11-23 DIAGNOSIS — J45909 Unspecified asthma, uncomplicated: Secondary | ICD-10-CM

## 2013-11-23 DIAGNOSIS — I359 Nonrheumatic aortic valve disorder, unspecified: Secondary | ICD-10-CM | POA: Diagnosis present

## 2013-11-23 DIAGNOSIS — Z825 Family history of asthma and other chronic lower respiratory diseases: Secondary | ICD-10-CM

## 2013-11-23 DIAGNOSIS — Z7901 Long term (current) use of anticoagulants: Secondary | ICD-10-CM

## 2013-11-23 DIAGNOSIS — E039 Hypothyroidism, unspecified: Secondary | ICD-10-CM | POA: Diagnosis present

## 2013-11-23 DIAGNOSIS — K851 Biliary acute pancreatitis without necrosis or infection: Secondary | ICD-10-CM | POA: Diagnosis present

## 2013-11-23 DIAGNOSIS — K8070 Calculus of gallbladder and bile duct without cholecystitis without obstruction: Secondary | ICD-10-CM | POA: Diagnosis present

## 2013-11-23 DIAGNOSIS — Z66 Do not resuscitate: Secondary | ICD-10-CM | POA: Diagnosis present

## 2013-11-23 DIAGNOSIS — J9601 Acute respiratory failure with hypoxia: Secondary | ICD-10-CM

## 2013-11-23 DIAGNOSIS — I10 Essential (primary) hypertension: Secondary | ICD-10-CM | POA: Diagnosis present

## 2013-11-23 DIAGNOSIS — J479 Bronchiectasis, uncomplicated: Secondary | ICD-10-CM

## 2013-11-23 DIAGNOSIS — A419 Sepsis, unspecified organism: Secondary | ICD-10-CM

## 2013-11-23 DIAGNOSIS — J189 Pneumonia, unspecified organism: Secondary | ICD-10-CM

## 2013-11-23 DIAGNOSIS — R1110 Vomiting, unspecified: Secondary | ICD-10-CM

## 2013-11-23 DIAGNOSIS — I498 Other specified cardiac arrhythmias: Secondary | ICD-10-CM | POA: Diagnosis present

## 2013-11-23 DIAGNOSIS — Z85038 Personal history of other malignant neoplasm of large intestine: Secondary | ICD-10-CM

## 2013-11-23 DIAGNOSIS — R609 Edema, unspecified: Secondary | ICD-10-CM

## 2013-11-23 DIAGNOSIS — K859 Acute pancreatitis without necrosis or infection, unspecified: Principal | ICD-10-CM | POA: Diagnosis present

## 2013-11-23 DIAGNOSIS — R930 Abnormal findings on diagnostic imaging of skull and head, not elsewhere classified: Secondary | ICD-10-CM

## 2013-11-23 DIAGNOSIS — I509 Heart failure, unspecified: Secondary | ICD-10-CM | POA: Diagnosis present

## 2013-11-23 DIAGNOSIS — I4891 Unspecified atrial fibrillation: Secondary | ICD-10-CM | POA: Diagnosis present

## 2013-11-23 DIAGNOSIS — J309 Allergic rhinitis, unspecified: Secondary | ICD-10-CM

## 2013-11-23 DIAGNOSIS — Z8249 Family history of ischemic heart disease and other diseases of the circulatory system: Secondary | ICD-10-CM

## 2013-11-23 LAB — CBC WITH DIFFERENTIAL/PLATELET
Basophils Absolute: 0 10*3/uL (ref 0.0–0.1)
Basophils Relative: 0 % (ref 0–1)
EOS PCT: 0 % (ref 0–5)
Eosinophils Absolute: 0 10*3/uL (ref 0.0–0.7)
HCT: 36.1 % — ABNORMAL LOW (ref 39.0–52.0)
HEMOGLOBIN: 12.5 g/dL — AB (ref 13.0–17.0)
LYMPHS PCT: 3 % — AB (ref 12–46)
Lymphs Abs: 0.5 10*3/uL — ABNORMAL LOW (ref 0.7–4.0)
MCH: 31.3 pg (ref 26.0–34.0)
MCHC: 34.6 g/dL (ref 30.0–36.0)
MCV: 90.3 fL (ref 78.0–100.0)
MONOS PCT: 13 % — AB (ref 3–12)
Monocytes Absolute: 2.1 10*3/uL — ABNORMAL HIGH (ref 0.1–1.0)
NEUTROS ABS: 13.5 10*3/uL — AB (ref 1.7–7.7)
Neutrophils Relative %: 84 % — ABNORMAL HIGH (ref 43–77)
Platelets: 250 10*3/uL (ref 150–400)
RBC: 4 MIL/uL — AB (ref 4.22–5.81)
RDW: 15.3 % (ref 11.5–15.5)
WBC: 16.1 10*3/uL — AB (ref 4.0–10.5)

## 2013-11-23 LAB — POCT I-STAT, CHEM 8
BUN: 25 mg/dL — AB (ref 6–23)
CREATININE: 1.2 mg/dL (ref 0.50–1.35)
Calcium, Ion: 1.23 mmol/L (ref 1.13–1.30)
Chloride: 106 mEq/L (ref 96–112)
GLUCOSE: 140 mg/dL — AB (ref 70–99)
HCT: 39 % (ref 39.0–52.0)
HEMOGLOBIN: 13.3 g/dL (ref 13.0–17.0)
Potassium: 4.1 mEq/L (ref 3.7–5.3)
SODIUM: 142 meq/L (ref 137–147)
TCO2: 23 mmol/L (ref 0–100)

## 2013-11-23 LAB — URINALYSIS, ROUTINE W REFLEX MICROSCOPIC
BILIRUBIN URINE: NEGATIVE
Glucose, UA: NEGATIVE mg/dL
Hgb urine dipstick: NEGATIVE
Ketones, ur: NEGATIVE mg/dL
Nitrite: NEGATIVE
PH: 5.5 (ref 5.0–8.0)
Protein, ur: NEGATIVE mg/dL
SPECIFIC GRAVITY, URINE: 1.019 (ref 1.005–1.030)
UROBILINOGEN UA: 1 mg/dL (ref 0.0–1.0)

## 2013-11-23 LAB — MAGNESIUM: Magnesium: 1.8 mg/dL (ref 1.5–2.5)

## 2013-11-23 LAB — URINE MICROSCOPIC-ADD ON

## 2013-11-23 LAB — HEPATIC FUNCTION PANEL
ALBUMIN: 3.8 g/dL (ref 3.5–5.2)
ALT: 126 U/L — ABNORMAL HIGH (ref 0–53)
AST: 210 U/L — AB (ref 0–37)
Alkaline Phosphatase: 145 U/L — ABNORMAL HIGH (ref 39–117)
BILIRUBIN TOTAL: 1.6 mg/dL — AB (ref 0.3–1.2)
Bilirubin, Direct: 1.1 mg/dL — ABNORMAL HIGH (ref 0.0–0.3)
Indirect Bilirubin: 0.5 mg/dL (ref 0.3–0.9)
TOTAL PROTEIN: 7.2 g/dL (ref 6.0–8.3)

## 2013-11-23 LAB — BASIC METABOLIC PANEL
BUN: 25 mg/dL — AB (ref 6–23)
CHLORIDE: 104 meq/L (ref 96–112)
CO2: 21 meq/L (ref 19–32)
CREATININE: 1.1 mg/dL (ref 0.50–1.35)
Calcium: 9.3 mg/dL (ref 8.4–10.5)
GFR calc non Af Amer: 58 mL/min — ABNORMAL LOW (ref >90)
GFR, EST AFRICAN AMERICAN: 68 mL/min — AB (ref >90)
Glucose, Bld: 141 mg/dL — ABNORMAL HIGH (ref 70–99)
POTASSIUM: 4.3 meq/L (ref 3.7–5.3)
Sodium: 142 mEq/L (ref 137–147)

## 2013-11-23 LAB — LIPASE, BLOOD: Lipase: >3000 U/L — ABNORMAL HIGH (ref 11–59)

## 2013-11-23 MED ORDER — SODIUM CHLORIDE 0.9 % IV SOLN
Freq: Once | INTRAVENOUS | Status: AC
  Administered 2013-11-23: 18:00:00 via INTRAVENOUS

## 2013-11-23 MED ORDER — PANTOPRAZOLE SODIUM 40 MG PO TBEC
80.0000 mg | DELAYED_RELEASE_TABLET | Freq: Every day | ORAL | Status: DC
  Administered 2013-11-24 – 2013-11-28 (×5): 80 mg via ORAL
  Filled 2013-11-23 (×5): qty 2

## 2013-11-23 MED ORDER — ONDANSETRON HCL 4 MG/2ML IJ SOLN
INTRAMUSCULAR | Status: AC
  Filled 2013-11-23: qty 2

## 2013-11-23 MED ORDER — MOMETASONE FURO-FORMOTEROL FUM 200-5 MCG/ACT IN AERO
2.0000 | INHALATION_SPRAY | Freq: Two times a day (BID) | RESPIRATORY_TRACT | Status: DC
  Administered 2013-11-24 – 2013-11-28 (×8): 2 via RESPIRATORY_TRACT
  Filled 2013-11-23 (×2): qty 8.8

## 2013-11-23 MED ORDER — SODIUM CHLORIDE 0.9 % IV SOLN
INTRAVENOUS | Status: DC
  Administered 2013-11-23 – 2013-11-24 (×2): via INTRAVENOUS
  Administered 2013-11-24: 1000 mL via INTRAVENOUS

## 2013-11-23 MED ORDER — SODIUM CHLORIDE 0.9 % IJ SOLN
3.0000 mL | Freq: Two times a day (BID) | INTRAMUSCULAR | Status: DC
  Administered 2013-11-24 – 2013-11-28 (×9): 3 mL via INTRAVENOUS

## 2013-11-23 MED ORDER — SODIUM CHLORIDE 0.9 % IV BOLUS (SEPSIS)
500.0000 mL | Freq: Once | INTRAVENOUS | Status: AC
  Administered 2013-11-23: 500 mL via INTRAVENOUS

## 2013-11-23 MED ORDER — HYDROMORPHONE HCL PF 1 MG/ML IJ SOLN
1.0000 mg | INTRAMUSCULAR | Status: DC | PRN
Start: 2013-11-23 — End: 2013-11-28

## 2013-11-23 MED ORDER — LEVOTHYROXINE SODIUM 25 MCG PO TABS
25.0000 ug | ORAL_TABLET | Freq: Every day | ORAL | Status: DC
  Administered 2013-11-24 – 2013-11-28 (×5): 25 ug via ORAL
  Filled 2013-11-23 (×6): qty 1

## 2013-11-23 MED ORDER — ONDANSETRON HCL 4 MG/2ML IJ SOLN
4.0000 mg | Freq: Once | INTRAMUSCULAR | Status: AC
  Administered 2013-11-23: 4 mg via INTRAVENOUS

## 2013-11-23 MED ORDER — IOHEXOL 300 MG/ML  SOLN
80.0000 mL | Freq: Once | INTRAMUSCULAR | Status: AC | PRN
  Administered 2013-11-23: 80 mL via INTRAVENOUS

## 2013-11-23 MED ORDER — AMLODIPINE BESYLATE 5 MG PO TABS
5.0000 mg | ORAL_TABLET | Freq: Every day | ORAL | Status: DC
  Administered 2013-11-24: 5 mg via ORAL
  Filled 2013-11-23: qty 1

## 2013-11-23 MED ORDER — RIVAROXABAN 20 MG PO TABS
20.0000 mg | ORAL_TABLET | Freq: Every day | ORAL | Status: DC
  Filled 2013-11-23: qty 1

## 2013-11-23 MED ORDER — SIMVASTATIN 10 MG PO TABS
10.0000 mg | ORAL_TABLET | Freq: Every day | ORAL | Status: DC
  Filled 2013-11-23: qty 1

## 2013-11-23 NOTE — ED Notes (Signed)
Given footies

## 2013-11-23 NOTE — ED Notes (Signed)
Lab at bedside

## 2013-11-23 NOTE — ED Notes (Signed)
Pt to CT

## 2013-11-23 NOTE — ED Provider Notes (Signed)
Todd [name] is a 80 y.o. male who presents to Urgent Care today for fatigue, lethargy, abdominal pain vomiting and diarrhea. The symptoms have been present for one day and worsening today. He denies any chest pain but does note some shortness of breath. He notes that his abdominal pain is diffuse. He has not been eating and drinking well recently. He has not been exposed to anybody with illnesses recently. He has not changed his medications recently to his knowledge.   Both the patient and his daughter are unsure about his CODE STATUS. They think maybe he has a DNR somewhere better not sure.      Past Medical History  Diagnosis Date  . Asthma   . Hypertension   . Diabetes mellitus   . Cancer   . A-fib   . Hyperlipidemia   . Colon cancer   . Shortness of breath   . CHF (congestive heart failure) 10/28/2011  . Aortic stenosis     Mild  . Forgetfulness   . PVC's (premature ventricular contractions)   . Mild aortic insufficiency    History  Substance Use Topics  . 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 Smokeless tobacco: Not on file     Comment: quit 30+ yrs ago  . Alcohol Use: No   ROS as above Medications: Current Facility-Administered Medications  Medication Dose Route Frequency Provider Last Rate Last Dose  . 0.9 %  sodium chloride infusion   Intravenous Once Gregor Hams, MD       Current Outpatient Prescriptions  Medication Sig Dispense Refill  . amLODipine (NORVASC) 5 MG tablet TAKE 1 TABLET EVERY DAY  90 tablet  1  . ferrous gluconate (FERGON) 325 MG tablet Take 325 mg by mouth at bedtime.      . fluticasone (FLONASE) 50 MCG/ACT nasal spray Place 2 sprays into the nose daily. Prn allergy      . Fluticasone-Salmeterol (ADVAIR) 500-50 MCG/DOSE AEPB Inhale 1 puff into the lungs every 12 (twelve) hours. Prn asthma attack      . furosemide (LASIX) 20 MG tablet TAKE 1 TABLET EVERY DAY  90 tablet  1  . levothyroxine (SYNTHROID, LEVOTHROID) 25 MCG tablet TAKE 1 TABLET BY MOUTH  EVERY DAY  90 tablet  1  . losartan (COZAAR) 50 MG tablet TAKE 1 TABLET EVERY DAY  30 tablet  5  . omeprazole (PRILOSEC) 40 MG capsule TAKE ONE CAPSULE EVERY DAY  30 capsule  12  . potassium chloride SA (KLOR-CON M20) 20 MEQ tablet Take 0.5 tablets (10 mEq total) by mouth 2 (two) times daily.  90 tablet  0  . pravastatin (PRAVACHOL) 20 MG tablet Take 20 mg by mouth daily.        Alveda Reasons 20 MG TABS tablet ONE BY MOUTH DAILY WITH EVENING MEAL  90 tablet  1    Exam:  BP 114/49  Pulse 54  Temp(Src) 97.5 F (36.4 C) (Oral)  Resp 28  SpO2 96% Gen:  fatigued and ill-appearing  HEENT: EOMI,   dry mucous membrane  Lungs: Normal work of breathing. CTABL Heart: RRR no MRG Abd: echo active bowel sounds tender to palpation upper quadrant mild guarding no rebound.  Exts:  warm and nonedematous    Twelve-lead EKG shows atrial fibrillation at between 40 and 80 beats per minute on the monitor. No significant ST segment elevation or depression. Multiple PVCs. This is not significantly changed from previous EKG    No results found for this or  any previous visit (from the past 24 hour(s)). No results found.  Assessment and Plan: 80 y.o. male with dehydration, fatigue, abdominal pain and vomiting.   patient is elderly and has multiple medical comorbidities.  He appears to be in today. Ideally he would benefit from IV fluid resuscitation however has pre-existing heart failure is concerning.  Plan to transfer to the emergency room via EMS for further evaluation and management.  Daughter and patient expresses understanding and agreement.     Gregor Hams, MD 11/23/13 [phone]

## 2013-11-23 NOTE — ED Notes (Signed)
Abdominal pain for 2 days.  Reports diarrhea and vomiting.

## 2013-11-23 NOTE — ED Notes (Signed)
Given a urinal  

## 2013-11-23 NOTE — H&P (Signed)
[facility] History and Physical  Todd [name] FXT:[id number] DOB: 06-Oct-1924 DOA: 11/23/2013  Referring physician: EDP PCP: Todd Fraction, MD   Chief Complaint: Abdominal pain   HPI: Todd [name] is a 80 y.o. male who presents to the ED with abdominal pain, vomiting, diarrhea.  Symptoms onset 1 day ago, worsening today.  No chest pain but does have SOB.  Abd pain is diffuse.  Work up in the ED demonstrates acute pancreatitis, likely gallstone pancreatitis.    Review of Systems: Systems reviewed.  As above, otherwise negative  Past Medical History  Diagnosis Date  . Asthma   . Hypertension   . Diabetes mellitus   . Cancer   . A-fib   . Hyperlipidemia   . Colon cancer   . Shortness of breath   . CHF (congestive heart failure) 10/28/2011  . Aortic stenosis     Mild  . Forgetfulness   . PVC's (premature ventricular contractions)   . Mild aortic insufficiency    Past Surgical History  Procedure Laterality Date  . Colon surgery      Partial hemecolectomy   . Central line insertion  09/08/2011        Social History:  reports that he has quit smoking. He does not have any smokeless tobacco history on file. He reports that he does not drink alcohol or use illicit drugs.  No Known Allergies  Family History  Problem Relation Age of Onset  . Hyperlipidemia Father   . Hypertension Father   . Heart disease Father   . Asthma Father   . Stroke Father   . Heart failure Father   . Heart disease Mother   . Heart failure Mother   . Heart attack Sister      Prior to Admission medications   Medication Sig Start Date End Date Taking? Authorizing Provider  amLODipine (NORVASC) 5 MG tablet TAKE 1 TABLET EVERY DAY 08/06/13  Yes Todd Frizzle, MD  ferrous gluconate (FERGON) 325 MG tablet Take 325 mg by mouth at bedtime.   Yes Historical Provider, MD  fluticasone (FLONASE) 50 MCG/ACT nasal spray Place 2 sprays into the nose daily as needed for allergies or  rhinitis.    Yes Historical Provider, MD  Fluticasone-Salmeterol (ADVAIR) 500-50 MCG/DOSE AEPB Inhale 1 puff into the lungs every 12 (twelve) hours. Prn asthma attack   Yes Historical Provider, MD  furosemide (LASIX) 20 MG tablet TAKE 1 TABLET EVERY DAY 10/01/13  Yes Todd Frizzle, MD  levothyroxine (SYNTHROID, LEVOTHROID) 25 MCG tablet TAKE 1 TABLET BY MOUTH EVERY DAY 10/01/13  Yes Todd Frizzle, MD  losartan (COZAAR) 50 MG tablet TAKE 1 TABLET EVERY DAY 10/29/13  Yes Todd Frizzle, MD  omeprazole (PRILOSEC) 40 MG capsule TAKE ONE CAPSULE EVERY DAY 05/29/13  Yes Todd Frizzle, MD  potassium chloride SA (K-DUR,KLOR-CON) 20 MEQ tablet Take 20 mEq by mouth 2 (two) times daily.   Yes Historical Provider, MD  pravastatin (PRAVACHOL) 20 MG tablet Take 20 mg by mouth daily.     Yes Historical Provider, MD  XARELTO 20 MG TABS tablet ONE BY MOUTH DAILY WITH EVENING MEAL 09/03/13  Yes Todd Frizzle, MD   Physical Exam: Filed Vitals:   11/23/13 2145  BP: 133/63  Pulse: 78  Temp:   Resp: 21    BP 133/63  Pulse 78  Temp(Src) 98.7 F (37.1 C) (Oral)  Resp 21  SpO2 95%  General Appearance:    Alert, oriented, no  distress, appears stated age  Head:    Normocephalic, atraumatic  Eyes:    PERRL, EOMI, sclera non-icteric        Nose:   Nares without drainage or epistaxis. Mucosa, turbinates normal  Throat:   Moist mucous membranes. Oropharynx without erythema or exudate.  Neck:   Supple. No carotid bruits.  No thyromegaly.  No lymphadenopathy.   Back:     No CVA tenderness, no spinal tenderness  Lungs:     Clear to auscultation bilaterally, without wheezes, rhonchi or rales  Chest wall:    No tenderness to palpitation  Heart:    Regular rate and rhythm without murmurs, gallops, rubs  Abdomen:     Soft, non-tender, nondistended, normal bowel sounds, no organomegaly  Genitalia:    deferred  Rectal:    deferred  Extremities:   No clubbing, cyanosis or edema.  Pulses:   2+ and symmetric  all extremities  Skin:   Skin color, texture, turgor normal, no rashes or lesions  Lymph nodes:   Cervical, supraclavicular, and axillary nodes normal  Neurologic:   CNII-XII intact. Normal strength, sensation and reflexes      throughout    Labs on Admission:  Basic Metabolic Panel:  Recent Labs Lab 11/23/13 1827 11/23/13 1835  NA 142 142  K 4.3 4.1  CL 104 106  CO2 21  --   GLUCOSE 141* 140*  BUN 25* 25*  CREATININE 1.10 1.20  CALCIUM 9.3  --   MG 1.8  --    Liver Function Tests:  Recent Labs Lab 11/23/13 1827  AST 210*  ALT 126*  ALKPHOS 145*  BILITOT 1.6*  PROT 7.2  ALBUMIN 3.8    Recent Labs Lab 11/23/13 1827  LIPASE >3000*   No results found for this basename: AMMONIA,  in the last 168 hours CBC:  Recent Labs Lab 11/23/13 1827 11/23/13 1835  WBC 16.1*  --   NEUTROABS 13.5*  --   HGB 12.5* 13.3  HCT 36.1* 39.0  MCV 90.3  --   PLT 250  --    Cardiac Enzymes: No results found for this basename: CKTOTAL, CKMB, CKMBINDEX, TROPONINI,  in the last 168 hours  BNP (last 3 results) No results found for this basename: PROBNP,  in the last 8760 hours CBG: No results found for this basename: GLUCAP,  in the last 168 hours  Radiological Exams on Admission: Ct Abdomen Pelvis W Contrast  11/23/2013   CLINICAL DATA:  Abdominal pain.  Vomiting.  Diarrhea.  EXAM: CT ABDOMEN AND PELVIS WITH CONTRAST  TECHNIQUE: Multidetector CT imaging of the abdomen and pelvis was performed using the standard protocol following bolus administration of intravenous contrast.  CONTRAST:  63mL OMNIPAQUE IOHEXOL 300 MG/ML  SOLN  COMPARISON:  PET-CT on 12/20/2006  FINDINGS: Cholelithiasis is demonstrated. Mild gallbladder wall thickening is seen without evidence of pericholecystic inflammatory changes or fluid. There is no evidence of biliary ductal dilatation. No liver masses are identified.  There is mild diffuse pancreatic edema and peripancreatic inflammatory changes, consistent  with acute pancreatitis. No evidence of pancreatic pseudocysts or mass. No evidence of pancreatic necrosis.  The spleen and adrenal glands are normal in appearance. Several small renal cysts are seen bilaterally however there is no evidence of renal masses or hydronephrosis. No other soft tissue masses or lymphadenopathy identified within the abdomen or pelvis.  Diverticulosis is seen involving the sigmoid colon, however there is no evidence of diverticulitis. No evidence of bowel wall thickening or  dilatation. Images through the lung bases show bibasilar scarring. Pulmonary nodule in the inferior aspect of the right middle lobe is stable since prior PET-CT, consistent with benign etiology. No suspicious bone lesions identified.1  IMPRESSION: Mild acute pancreatitis. No evidence of pancreatic necrosis, pseudocyst, or other complication.  Cholelithiasis. Mild gallbladder wall thickening is likely related to acute pancreatitis. No evidence of biliary ductal dilatation.  Diverticulosis. No radiographic evidence of diverticulitis.   Electronically Signed   By: Earle Gell M.D.   On: 11/23/2013 20:20   Dg Chest Portable 1 View  11/23/2013   CLINICAL DATA:  Fatigue  EXAM: PORTABLE CHEST - 1 VIEW  COMPARISON:  09/18/2011  FINDINGS: Low lung volumes and bibasilar atelectasis. Mild cardiomegaly. Chronic right upper lobe opacities. Chronic left basilar opacities.  IMPRESSION: Cardiomegaly  Bibasilar atelectasis.  Chronic opacities in the right upper lobe and left base.   Electronically Signed   By: Maryclare Bean M.D.   On: 11/23/2013 19:01    EKG: Independently reviewed.   Assessment/Plan Principal Problem:   Gallstone pancreatitis   1. Gallstone pancreatitis - No evidence of biliary ductal dialation seen on CT scan, does have gall bladder wall thickening.  Lipase > 3k, "mild" pancreatitis seen on CT, does have AST elevation and WBC 16k.  Admitting patient to SDU due to ranson's criteria.  IVF at 125 cc/hr.  GI  consult in AM, patient NPO, anticipate ERCP. 2. Chronic A.Fib - continue rate control and xarelto. 3. HTN - mild HTN, will hold off on his losartan in case his renal function worsens or BP trends low due to worsening of pancreatitis.    Code Status: DNR Family Communication: No family in room Disposition Plan: Admit to SDU   Time spent: 70 min  GARDNER, JARED M. [facility] Pager [phone]  If 7AM-7PM, please contact the day team taking care of the patient [email or web address] Password [facility] 11/23/2013, 10:29 PM

## 2013-11-23 NOTE — ED Notes (Addendum)
To ED from [facility] via [facility], reports V/D, abd pain, chills, ?fever, CBG 132, A/O, NAD

## 2013-11-23 NOTE — ED Notes (Signed)
Patient has o2 at 2 lnc, monitor with alarms at bedside.

## 2013-11-23 NOTE — ED Notes (Signed)
Dr. M. Allen at the bedside 

## 2013-11-23 NOTE — ED Notes (Signed)
Magda Paganini, CN, advised of pending transfer; [facility] Call, transport requested

## 2013-11-24 LAB — COMPREHENSIVE METABOLIC PANEL
ALK PHOS: 119 U/L — AB (ref 39–117)
ALT: 140 U/L — AB (ref 0–53)
AST: 141 U/L — AB (ref 0–37)
Albumin: 3 g/dL — ABNORMAL LOW (ref 3.5–5.2)
BUN: 19 mg/dL (ref 6–23)
CALCIUM: 8.6 mg/dL (ref 8.4–10.5)
CHLORIDE: 111 meq/L (ref 96–112)
CO2: 21 meq/L (ref 19–32)
Creatinine, Ser: 1.03 mg/dL (ref 0.50–1.35)
GFR calc Af Amer: 73 mL/min — ABNORMAL LOW (ref >90)
GFR, EST NON AFRICAN AMERICAN: 63 mL/min — AB (ref >90)
GLUCOSE: 89 mg/dL (ref 70–99)
POTASSIUM: 3.8 meq/L (ref 3.7–5.3)
SODIUM: 145 meq/L (ref 137–147)
Total Bilirubin: 2 mg/dL — ABNORMAL HIGH (ref 0.3–1.2)
Total Protein: 5.8 g/dL — ABNORMAL LOW (ref 6.0–8.3)

## 2013-11-24 LAB — MRSA PCR SCREENING: MRSA by PCR: NEGATIVE

## 2013-11-24 LAB — LACTATE DEHYDROGENASE: LDH: 370 U/L — AB (ref 94–250)

## 2013-11-24 LAB — GLUCOSE, CAPILLARY
GLUCOSE-CAPILLARY: 83 mg/dL (ref 70–99)
Glucose-Capillary: 87 mg/dL (ref 70–99)

## 2013-11-24 MED ORDER — ENOXAPARIN SODIUM 80 MG/0.8ML ~~LOC~~ SOLN
70.0000 mg | Freq: Two times a day (BID) | SUBCUTANEOUS | Status: DC
  Administered 2013-11-24 – 2013-11-27 (×6): 70 mg via SUBCUTANEOUS
  Filled 2013-11-24 (×9): qty 0.8

## 2013-11-24 MED ORDER — INSULIN ASPART 100 UNIT/ML ~~LOC~~ SOLN
0.0000 [IU] | SUBCUTANEOUS | Status: DC
Start: 2013-11-24 — End: 2013-11-25

## 2013-11-24 NOTE — Progress Notes (Signed)
ANTICOAGULATION CONSULT NOTE - Initial Consult  Pharmacy Consult for Change Xarelto to Lovenox Indication: atrial fibrillation; change in case of emergent procedure  No Known Allergies  Patient Measurements: Height: 6' (182.9 cm) Weight: 154 lb 12.2 oz (70.2 kg) IBW/kg (Calculated) : 77.6  Vital Signs: Temp: 98.5 F (36.9 C) (01/24 1200) Temp src: Oral (01/24 1200) BP: 112/62 mmHg (01/24 1143) Pulse Rate: 53 (01/24 1143)  Labs:  Recent Labs  11/23/13 1827 11/23/13 1835 11/24/13 1100  HGB 12.5* 13.3  --   HCT 36.1* 39.0  --   PLT 250  --   --   CREATININE 1.10 1.20 1.03    Estimated Creatinine Clearance: 50.2 ml/min (by C-G formula based on Cr of 1.03).   Medical History: Past Medical History  Diagnosis Date  . Asthma   . Hypertension   . Diabetes mellitus   . Cancer   . A-fib   . Hyperlipidemia   . Colon cancer   . Shortness of breath   . CHF (congestive heart failure) 10/28/2011  . Aortic stenosis     Mild  . Forgetfulness   . PVC's (premature ventricular contractions)   . Mild aortic insufficiency     Medications:  Last Xarelto dose was 11/22/13. Patient did not receive dose this stay.   Assessment: 73 YOM on Xarelto PTA due to atrial fibrillation. Patient now admitted with acute gallstone pancreatitis with possible need for procedure to change to Lovenox during this stay. CrCl currently is 50 mL/min and it has been >24 hours since last Xarelto dose. Last CBC was on 1/23- stable. Current CT shows no acute complications and per GI no indication for emergent surgery at this time.   Goal of Therapy:  Anti-Xa level 0.6-1 units/ml 4hrs after LMWH dose given as needed Monitor platelets by anticoagulation protocol: Yes   Plan:  1. Lovenox 70mg  SQ BID.  2. Monitor CBC every 72 hours while on therapy. 3. Monitor for signs and symptoms of bleeding and follow renal function to adjust dose as needed.  4. Follow-up plan for GI surgery and need to hold Lovenox  for procedure. 5. Follow-up ability to switch back to patient's home Xarelto when all procedures have been ruled out.   Sloan Leiter, PharmD, BCPS Clinical Pharmacist [phone] 11/24/2013,3:28 PM

## 2013-11-24 NOTE — Progress Notes (Addendum)
[place] TEAM 1 - Stepdown/ICU TEAM Progress Note  Todd [name] [id number]:[id number] DOB: 12-10-1923 DOA: 11/23/2013 PCP: Odette Fraction, MD  Admit HPI / Brief Narrative: 80 y.o. male w/ no signif GI hx who presented to the ED with diffuse abdominal pain, vomiting, diarrhea. Symptoms onset 1 day prior. Work up in the ED demonstrated acute pancreatitis, likely gallstone related.  HPI/Subjective: Feeling much better.  C/o modest epigastric pain but no RUQ abdom pain.  No vomiting.  No cp or sob.   Assessment/Plan:  Acute gallstone pancreatitis  Recheck LFTs in the morning - no evidence of biliary ductal dilatation on CT scan - follow for s/sx that would necessitate ERCP/actue intervention  Cholelithiasis  No evidence of choledocholithiasis via CT - follow LFT trend - will need eventual cholecystectomy but does not appear to need acute ERCP at this time   DM Follow CBG trend - Dextrose as needed while NPO  HTN Well controlled at present   Chronic Afib  Rate controlled - on chronic Xarelto - change to lovenox in case emergent procedure required  - followed by Dr. Harrington Challenger  HLD  Mild AoS  Hx colon CA s/p hemicolectomy  COPD w/ bronchiectasis Well compensated at this time   Code Status: NO CODE Family Communication: spoke w/ daughter via phone  Disposition Plan: SDU  Consultants: none  Procedures: none  Antibiotics: none  DVT prophylaxis: Xarelto > lovenox   Objective: Blood pressure 112/62, pulse 53, temperature 98.5 F (36.9 C), temperature source Oral, resp. rate 23, height 6' (1.829 m), weight 70.2 kg (154 lb 12.2 oz), SpO2 95.00%.  Intake/Output Summary (Last 24 hours) at 11/24/13 1523 Last data filed at 11/24/13 1457  Gross per 24 hour  Intake      0 ml  Output    700 ml  Net   -700 ml   Exam: General: No acute respiratory distress Lungs: Clear to auscultation bilaterally without wheezes or crackles Cardiovascular: Regular rate without murmur gallop  or rub normal S1 and S2 Abdomen: Tender in epigastrium, nondistended, soft, bowel sounds positive, no rebound, no ascites, no appreciable mass Extremities: No significant cyanosis, clubbing, or edema bilateral lower extremities  Data Reviewed: Basic Metabolic Panel:  Recent Labs Lab 11/23/13 1827 11/23/13 1835 11/24/13 1100  NA 142 142 145  K 4.3 4.1 3.8  CL 104 106 111  CO2 21  --  21  GLUCOSE 141* 140* 89  BUN 25* 25* 19  CREATININE 1.10 1.20 1.03  CALCIUM 9.3  --  8.6  MG 1.8  --   --    Liver Function Tests:  Recent Labs Lab 11/23/13 1827 11/24/13 1100  AST 210* 141*  ALT 126* 140*  ALKPHOS 145* 119*  BILITOT 1.6* 2.0*  PROT 7.2 5.8*  ALBUMIN 3.8 3.0*    Recent Labs Lab 11/23/13 1827  LIPASE >3000*   CBC:  Recent Labs Lab 11/23/13 1827 11/23/13 1835  WBC 16.1*  --   NEUTROABS 13.5*  --   HGB 12.5* 13.3  HCT 36.1* 39.0  MCV 90.3  --   PLT 250  --    CBG: No results found for this basename: GLUCAP,  in the last 168 hours  Recent Results (from the past 240 hour(s))  MRSA PCR SCREENING     Status: None   Collection Time    11/24/13 12:02 AM      Result Value Range Status   MRSA by PCR NEGATIVE  NEGATIVE Final   Comment:  The GeneXpert MRSA Assay (FDA     approved for NASAL specimens     only), is one component of a     comprehensive MRSA colonization     surveillance program. It is not     intended to diagnose MRSA     infection nor to guide or     monitor treatment for     MRSA infections.     Studies:  Recent x-ray studies have been reviewed in detail by the Attending Physician  Scheduled Meds:  Scheduled Meds: . amLODipine  5 mg Oral Daily  . levothyroxine  25 mcg Oral QAC breakfast  . mometasone-formoterol  2 puff Inhalation BID  . pantoprazole  80 mg Oral Daily  . simvastatin  10 mg Oral q1800  . sodium chloride  3 mL Intravenous Q12H    Time spent on care of this patient: 35 mins   [place]  [phone] Pager - Text Page per Shea Evans as per below:  On-Call/Text Page:      [email or web address]      password [email or web address]  If 7PM-7AM, please contact night-coverage [email or web address] Password [email or web address] 11/24/2013, 3:23 PM   LOS: 1 day

## 2013-11-24 NOTE — ED Provider Notes (Signed)
CSN: [id number]     Arrival date & time 11/23/13  1802 History   First MD Initiated Contact with [name] 11/23/13 1803     Chief Complaint  [name] presents with  . Fatigue  . Emesis    HPI: Todd [name] is an 80 yo M with history of HTN, DM, atrial fibrillation on Xarelto and CHF who presents as transfer from urgent care for evaluation of abdominal pain and vomiting. His symptoms started yesterday, he had one episode of NBNB emesis and 2 episodes of diarrhea. He complained of only mild abdominal discomfort. Today he complained of severe abdominal pain and had three additional episodes of NBNB emesis. Pain is diffuse, described as "pain," worse with palpation, no relieving factors. Pain is currently 0/10. He does endorse nausea and mild SOB. He was able to eat breakfast but has not eaten since that time. He has no appetite currently. No fever, chills, chest pain or leg swelling. He does not recall having pain similar to this in Todd past. His daughter took him to urgent care, but they requested transfer to [facility] ED.    Past Medical History  Diagnosis Date  . Asthma   . Hypertension   . Diabetes mellitus   . Cancer   . A-fib   . Hyperlipidemia   . Colon cancer   . Shortness of breath   . CHF (congestive heart failure) 10/28/2011  . Aortic stenosis     Mild  . Forgetfulness   . PVC's (premature ventricular contractions)   . Mild aortic insufficiency    Past Surgical History  Procedure Laterality Date  . Colon surgery      Partial hemecolectomy   . Central line insertion  09/08/2011        Family History  Problem Relation Age of Onset  . Hyperlipidemia Father   . Hypertension Father   . Heart disease Father   . Asthma Father   . Stroke Father   . Heart failure Father   . Heart disease Mother   . Heart failure Mother   . Heart attack Sister    History  Substance Use Topics  . 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 Smokeless tobacco: Not on file     Comment: quit 30+ yrs ago  .  Alcohol Use: No    Review of Systems  Constitutional: Positive for appetite change (decreased) and fatigue. Negative for fever and chills.  Eyes: Negative for photophobia and visual disturbance.  Respiratory: Positive for shortness of breath. Negative for cough.   Cardiovascular: Negative for chest pain and leg swelling.  Gastrointestinal: Positive for nausea, vomiting, abdominal pain and diarrhea. Negative for constipation and blood in stool.  Genitourinary: Negative for dysuria, frequency and decreased urine volume.  Musculoskeletal: Negative for arthralgias, back pain, gait problem and myalgias.  Skin: Negative for color change and wound.  Neurological: Negative for dizziness, syncope, light-headedness and headaches.  Psychiatric/Behavioral: Negative for confusion and agitation.  All other systems reviewed and are negative.    Allergies  Review of [name]'s allergies indicates no known allergies.  Home Medications  No current outpatient prescriptions on file. BP 120/56  Pulse 61  Temp(Src) 98.8 F (37.1 C) (Oral)  Resp 21  Ht 6' (1.829 m)  Wt 154 lb 12.2 oz (70.2 kg)  BMI 20.99 kg/m2  SpO2 93% Physical Exam  Nursing note and vitals reviewed. Constitutional: He is oriented to person, place, and time. He appears ill. No distress.  Elderly male, sitting up in bed,  appears ill. No acute distress.   HENT:  Head: Normocephalic and atraumatic.  Mouth/Throat: Mucous membranes are dry.  Eyes: Conjunctivae and EOM are normal. Pupils are equal, round, and reactive to light.  Neck: Normal range of motion. Neck supple.  Cardiovascular: Normal rate, normal heart sounds and intact distal pulses.  An irregularly irregular rhythm present.  Pulmonary/Chest: Effort normal and breath sounds normal. No respiratory distress.  Abdominal: Soft. Bowel sounds are decreased. There is tenderness in Todd right lower quadrant, suprapubic area and left lower quadrant. There is no rebound and no  guarding.  Musculoskeletal: Normal range of motion. He exhibits no edema and no tenderness.  Neurological: He is alert and oriented to person, place, and time. No cranial nerve deficit. Coordination normal.  Skin: Skin is warm and dry. No rash noted.  Psychiatric: He has a normal mood and affect. His behavior is normal.    ED Course  Procedures (including critical care time) Labs Review Labs Reviewed  CBC WITH DIFFERENTIAL - Abnormal; Notable for Todd following:    WBC 16.1 (*)    RBC 4.00 (*)    Hemoglobin 12.5 (*)    HCT 36.1 (*)    Neutrophils Relative % 84 (*)    Lymphocytes Relative 3 (*)    Monocytes Relative 13 (*)    Neutro Abs 13.5 (*)    Lymphs Abs 0.5 (*)    Monocytes Absolute 2.1 (*)    All other components within normal limits  BASIC METABOLIC PANEL - Abnormal; Notable for Todd following:    Glucose, Bld 141 (*)    BUN 25 (*)    GFR calc non Af Amer 58 (*)    GFR calc Af Amer 68 (*)    All other components within normal limits  LIPASE, BLOOD - Abnormal; Notable for Todd following:    Lipase >3000 (*)    All other components within normal limits  URINALYSIS, ROUTINE W REFLEX MICROSCOPIC - Abnormal; Notable for Todd following:    Leukocytes, UA SMALL (*)    All other components within normal limits  HEPATIC FUNCTION PANEL - Abnormal; Notable for Todd following:    AST 210 (*)    ALT 126 (*)    Alkaline Phosphatase 145 (*)    Total Bilirubin 1.6 (*)    Bilirubin, Direct 1.1 (*)    All other components within normal limits  URINE MICROSCOPIC-ADD ON - Abnormal; Notable for Todd following:    Casts HYALINE CASTS (*)    All other components within normal limits  LACTATE DEHYDROGENASE - Abnormal; Notable for Todd following:    LDH 370 (*)    All other components within normal limits  POCT I-STAT, CHEM 8 - Abnormal; Notable for Todd following:    BUN 25 (*)    Glucose, Bld 140 (*)    All other components within normal limits  MRSA PCR SCREENING  MAGNESIUM   COMPREHENSIVE METABOLIC PANEL   Imaging Review Ct Abdomen Pelvis W Contrast  11/23/2013   CLINICAL DATA:  Abdominal pain.  Vomiting.  Diarrhea.  EXAM: CT ABDOMEN AND PELVIS WITH CONTRAST  TECHNIQUE: Multidetector CT imaging of Todd abdomen and pelvis was performed using Todd standard protocol following bolus administration of intravenous contrast.  CONTRAST:  3m OMNIPAQUE IOHEXOL 300 MG/ML  SOLN  COMPARISON:  PET-CT on 12/20/2006  FINDINGS: Cholelithiasis is demonstrated. Mild gallbladder wall thickening is seen without evidence of pericholecystic inflammatory changes or fluid. There is no evidence of biliary ductal dilatation. No liver  masses are identified.  There is mild diffuse pancreatic edema and peripancreatic inflammatory changes, consistent with acute pancreatitis. No evidence of pancreatic pseudocysts or mass. No evidence of pancreatic necrosis.  Todd spleen and adrenal glands are normal in appearance. Several small renal cysts are seen bilaterally however there is no evidence of renal masses or hydronephrosis. No other soft tissue masses or lymphadenopathy identified within Todd abdomen or pelvis.  Diverticulosis is seen involving Todd sigmoid colon, however there is no evidence of diverticulitis. No evidence of bowel wall thickening or dilatation. Images through Todd lung bases show bibasilar scarring. Pulmonary nodule in Todd inferior aspect of Todd right middle lobe is stable since prior PET-CT, consistent with benign etiology. No suspicious bone lesions identified.1  IMPRESSION: Mild acute pancreatitis. No evidence of pancreatic necrosis, pseudocyst, or other complication.  Cholelithiasis. Mild gallbladder wall thickening is likely related to acute pancreatitis. No evidence of biliary ductal dilatation.  Diverticulosis. No radiographic evidence of diverticulitis.   Electronically Signed   By: Earle Gell M.D.   On: 11/23/2013 20:20   Dg Chest Portable 1 View  11/23/2013   CLINICAL DATA:  Fatigue   EXAM: PORTABLE CHEST - 1 VIEW  COMPARISON:  09/18/2011  FINDINGS: Low lung volumes and bibasilar atelectasis. Mild cardiomegaly. Chronic right upper lobe opacities. Chronic left basilar opacities.  IMPRESSION: Cardiomegaly  Bibasilar atelectasis.  Chronic opacities in Todd right upper lobe and left base.   Electronically Signed   By: Maryclare Bean M.D.   On: 11/23/2013 19:01    EKG Interpretation    Date/Time:  Friday November 23 2013 18:16:34 EST Ventricular Rate:  80 PR Interval:  141 QRS Duration: 91 QT Interval:  363 QTC Calculation: 419 R Axis:   -47 Text Interpretation:  Sinus rhythm Multiple premature complexes, vent  Left anterior fascicular block Anterior infarct, old Borderline repolarization abnormality Difficult to determine change from prior given artifact throughout Confirmed by DOCHERTY  MD, MEGAN [phone]) on 11/23/2013 6:28:26 PM            MDM    80 yo M with history of HTN, DM, atrial fibrillation on Xarelto and CHF who presents as transfer from urgent care for evaluation of abdominal pain and vomiting. TTP in lower abdomen, initial concern for appendicitis vs diverticulitis vs SBO. He is HD stable. Labs concerning for pancreatitis given lipase >3000. AST 210, ALT 126, Alk phos 145, T bili 1.6. Normal lytes and cr. WBC 16.1, Hgb 12.5. CT shows no complications specifically no necrosis, abscess or psuedocyst. Also no biliary dilation. Likely gallstone pancreatitis with passage of stone. No emergent indication for GI or surgery consult. He was treated with zofran and IVF's. No recurrent vomiting in [facility] ED. He was admitted to stepdown given 15% predicted mortality by Ranson's criteria. Todd [name] and his daughter were updated on plan, they were in agreement. He remained HD stable while in [facility] ED.   Reviewed imaging, labs and previous medical records, utilized in MDM  Discussed case with Dr. Tawnya Crook  Clinical Impression 1. Gallstone pancreatitis 2. Transaminitis 3.  Leukocytosis    Louretta Shorten, MD 11/24/13 1318

## 2013-11-24 NOTE — Progress Notes (Signed)
Patient consented verbally to have belongings (clothes and wallet) taken home with [facility]

## 2013-11-25 LAB — GLUCOSE, CAPILLARY
GLUCOSE-CAPILLARY: 71 mg/dL (ref 70–99)
GLUCOSE-CAPILLARY: 93 mg/dL (ref 70–99)
GLUCOSE-CAPILLARY: 110 mg/dL — AB (ref 70–99)
GLUCOSE-CAPILLARY: 115 mg/dL — AB (ref 70–99)
GLUCOSE-CAPILLARY: 161 mg/dL — AB (ref 70–99)
Glucose-Capillary: 83 mg/dL (ref 70–99)

## 2013-11-25 LAB — COMPREHENSIVE METABOLIC PANEL
ALBUMIN: 3 g/dL — AB (ref 3.5–5.2)
ALT: 113 U/L — ABNORMAL HIGH (ref 0–53)
AST: 102 U/L — ABNORMAL HIGH (ref 0–37)
Alkaline Phosphatase: 134 U/L — ABNORMAL HIGH (ref 39–117)
BUN: 18 mg/dL (ref 6–23)
CO2: 19 mEq/L (ref 19–32)
CREATININE: 1.06 mg/dL (ref 0.50–1.35)
Calcium: 8.8 mg/dL (ref 8.4–10.5)
Chloride: 108 mEq/L (ref 96–112)
GFR calc Af Amer: 71 mL/min — ABNORMAL LOW (ref >90)
GFR calc non Af Amer: 61 mL/min — ABNORMAL LOW (ref >90)
Glucose, Bld: 76 mg/dL (ref 70–99)
Potassium: 3.8 mEq/L (ref 3.7–5.3)
Sodium: 142 mEq/L (ref 137–147)
TOTAL PROTEIN: 6.1 g/dL (ref 6.0–8.3)
Total Bilirubin: 1.6 mg/dL — ABNORMAL HIGH (ref 0.3–1.2)

## 2013-11-25 LAB — LIPASE, BLOOD: Lipase: 649 U/L — ABNORMAL HIGH (ref 11–59)

## 2013-11-25 LAB — CBC
HCT: 30.7 % — ABNORMAL LOW (ref 39.0–52.0)
HEMOGLOBIN: 10.3 g/dL — AB (ref 13.0–17.0)
MCH: 30.2 pg (ref 26.0–34.0)
MCHC: 33.6 g/dL (ref 30.0–36.0)
MCV: 90 fL (ref 78.0–100.0)
Platelets: 189 10*3/uL (ref 150–400)
RBC: 3.41 MIL/uL — AB (ref 4.22–5.81)
RDW: 15.7 % — ABNORMAL HIGH (ref 11.5–15.5)
WBC: 8.3 10*3/uL (ref 4.0–10.5)

## 2013-11-25 MED ORDER — DEXTROSE-NACL 5-0.9 % IV SOLN
INTRAVENOUS | Status: DC
  Administered 2013-11-25 – 2013-11-26 (×3): via INTRAVENOUS

## 2013-11-25 MED ORDER — INSULIN ASPART 100 UNIT/ML ~~LOC~~ SOLN
0.0000 [IU] | Freq: Three times a day (TID) | SUBCUTANEOUS | Status: DC
  Administered 2013-11-26: 1 [IU] via SUBCUTANEOUS

## 2013-11-25 NOTE — ED Provider Notes (Signed)
Medical screening examination/treatment/procedure(s) were conducted as a shared visit with resident-physician practitioner(s) and myself.  I personally evaluated the patient during the encounter.  Pt is a 80 y.o. male with pmhx as above presenting with n/v, d/a from UC.  On PE, pt pale, ill-appearing.  No abdominal ttp on time of my exam.  Pt found to have gallstone pancreatitis was admitted to stepdown given 15% mortality by Ranson criteria.    Neta Ehlers, MD 11/25/13 [phone]

## 2013-11-25 NOTE — Progress Notes (Signed)
[place] TEAM 1 - Stepdown/ICU TEAM Progress Note  Todd [name] OZD:[id number] DOB: Dec 10, 1923 DOA: 11/23/2013 PCP: Odette Fraction, MD  Admit HPI / Brief Narrative: 81 y.o. male w/ no signif GI hx who presented to the ED with diffuse abdominal pain, vomiting, diarrhea. Symptoms onset 1 day prior. Work up in the ED demonstrated acute pancreatitis, likely gallstone related.  HPI/Subjective: No new complaints today.  Appetite has returned.  Pain in abdom signif improved.  Denies f/c, sob, or bp.  Wants to try to eat.    Assessment/Plan:  Acute gallstone pancreatitis  LFTs slowly improving - Tbil improving - no evidence of biliary ductal dilatation on CT scan - follow for s/sx that would necessitate ERCP/actue intervention  Cholelithiasis  No evidence of choledocholithiasis via CT - follow LFT trend - will need eventual cholecystectomy but does not appear to need acute ERCP at this time   DM Follow CBG trend - dextrose as needed until intake improved   HTN Well controlled at present   Chronic Afib  Rate controlled - on chronic Xarelto - changed to lovenox in case emergent procedure required  - followed by Dr. Harrington Challenger  HLD  Mild AoS Volume status appears to be balanced   Hx colon CA s/p hemicolectomy  COPD w/ bronchiectasis Well compensated at this time   Code Status: NO CODE Family Communication: no family present at time of exam  Disposition Plan: SDU  Consultants: none  Procedures: none  Antibiotics: none  DVT prophylaxis: Xarelto > lovenox   Objective: Blood pressure 130/54, pulse 51, temperature 98.6 F (37 C), temperature source Oral, resp. rate 21, height 6' (1.829 m), weight 73.4 kg (161 lb 13.1 oz), SpO2 96.00%.  Intake/Output Summary (Last 24 hours) at 11/25/13 1421 Last data filed at 11/25/13 0800  Gross per 24 hour  Intake 3566.67 ml  Output    850 ml  Net 2716.67 ml   Exam: General: No acute respiratory distress Lungs: Clear to  auscultation bilaterally without wheezes or crackles Cardiovascular: Regular rate without murmur gallop or rub  Abdomen: Tender in epigastrium but not severely so, nondistended, soft, bowel sounds positive, no rebound, no ascites, no appreciable mass Extremities: No significant cyanosis, clubbing, or edema bilateral lower extremities  Data Reviewed: Basic Metabolic Panel:  Recent Labs Lab 11/23/13 1827 11/23/13 1835 11/24/13 1100 11/25/13 0307  NA 142 142 145 142  K 4.3 4.1 3.8 3.8  CL 104 106 111 108  CO2 21  --  21 19  GLUCOSE 141* 140* 89 76  BUN 25* 25* 19 18  CREATININE 1.10 1.20 1.03 1.06  CALCIUM 9.3  --  8.6 8.8  MG 1.8  --   --   --    Liver Function Tests:  Recent Labs Lab 11/23/13 1827 11/24/13 1100 11/25/13 0307  AST 210* 141* 102*  ALT 126* 140* 113*  ALKPHOS 145* 119* 134*  BILITOT 1.6* 2.0* 1.6*  PROT 7.2 5.8* 6.1  ALBUMIN 3.8 3.0* 3.0*    Recent Labs Lab 11/23/13 1827 11/25/13 0307  LIPASE >3000* 649*   CBC:  Recent Labs Lab 11/23/13 1827 11/23/13 1835 11/25/13 0307  WBC 16.1*  --  8.3  NEUTROABS 13.5*  --   --   HGB 12.5* 13.3 10.3*  HCT 36.1* 39.0 30.7*  MCV 90.3  --  90.0  PLT 250  --  189   CBG:  Recent Labs Lab 11/24/13 2002 11/25/13 0012 11/25/13 0429 11/25/13 0828 11/25/13 1214  GLUCAP 83 83  71 93 110*    Recent Results (from the past 240 hour(s))  MRSA PCR SCREENING     Status: None   Collection Time    11/24/13 12:02 AM      Result Value Range Status   MRSA by PCR NEGATIVE  NEGATIVE Final   Comment:            The GeneXpert MRSA Assay (FDA     approved for NASAL specimens     only), is one component of a     comprehensive MRSA colonization     surveillance program. It is not     intended to diagnose MRSA     infection nor to guide or     monitor treatment for     MRSA infections.     Studies:  Recent x-ray studies have been reviewed in detail by the Attending Physician  Scheduled Meds:  Scheduled  Meds: . enoxaparin (LOVENOX) injection  70 mg Subcutaneous Q12H  . insulin aspart  0-9 Units Subcutaneous Q4H  . levothyroxine  25 mcg Oral QAC breakfast  . mometasone-formoterol  2 puff Inhalation BID  . pantoprazole  80 mg Oral Daily  . sodium chloride  3 mL Intravenous Q12H    Time spent on care of this patient: 35 mins   George  [phone] Pager - Text Page per Shea Evans as per below:  On-Call/Text Page:      [email or web address]      password [email or web address]  If 7PM-7AM, please contact night-coverage [email or web address] Password [email or web address] 11/25/2013, 2:21 PM   LOS: 2 days

## 2013-11-26 DIAGNOSIS — K8020 Calculus of gallbladder without cholecystitis without obstruction: Secondary | ICD-10-CM

## 2013-11-26 DIAGNOSIS — K859 Acute pancreatitis without necrosis or infection, unspecified: Secondary | ICD-10-CM

## 2013-11-26 LAB — GLUCOSE, CAPILLARY
GLUCOSE-CAPILLARY: 108 mg/dL — AB (ref 70–99)
Glucose-Capillary: 116 mg/dL — ABNORMAL HIGH (ref 70–99)
Glucose-Capillary: 127 mg/dL — ABNORMAL HIGH (ref 70–99)
Glucose-Capillary: 86 mg/dL (ref 70–99)
Glucose-Capillary: 119 mg/dL — ABNORMAL HIGH (ref 70–99)

## 2013-11-26 LAB — COMPREHENSIVE METABOLIC PANEL
ALBUMIN: 2.7 g/dL — AB (ref 3.5–5.2)
ALT: 73 U/L — ABNORMAL HIGH (ref 0–53)
AST: 53 U/L — ABNORMAL HIGH (ref 0–37)
Alkaline Phosphatase: 124 U/L — ABNORMAL HIGH (ref 39–117)
BILIRUBIN TOTAL: 0.9 mg/dL (ref 0.3–1.2)
BUN: 12 mg/dL (ref 6–23)
CALCIUM: 8.4 mg/dL (ref 8.4–10.5)
CHLORIDE: 108 meq/L (ref 96–112)
CO2: 18 mEq/L — ABNORMAL LOW (ref 19–32)
CREATININE: 0.83 mg/dL (ref 0.50–1.35)
GFR calc Af Amer: 89 mL/min — ABNORMAL LOW (ref >90)
GFR calc non Af Amer: 77 mL/min — ABNORMAL LOW (ref >90)
Glucose, Bld: 127 mg/dL — ABNORMAL HIGH (ref 70–99)
Potassium: 3.6 mEq/L — ABNORMAL LOW (ref 3.7–5.3)
Sodium: 140 mEq/L (ref 137–147)
Total Protein: 5.9 g/dL — ABNORMAL LOW (ref 6.0–8.3)

## 2013-11-26 MED ORDER — POTASSIUM CHLORIDE CRYS ER 20 MEQ PO TBCR
40.0000 meq | EXTENDED_RELEASE_TABLET | Freq: Once | ORAL | Status: AC
  Administered 2013-11-26: 40 meq via ORAL
  Filled 2013-11-26: qty 2

## 2013-11-26 NOTE — Progress Notes (Signed)
Report called to the receiving RN on 6N. Pt transferred to 6N room 19 via bed accompanied by RN and NT . Belonging went with pt.

## 2013-11-26 NOTE — Consult Note (Signed)
Wallace Gappa [facility] Aug 05, 1924  [id number].    Requesting MD: Dr. Thereasa Solo Chief Complaint/Reason for Consult: Gallstone pancreatitis  HPI:  80 y.o. male with PMH CHF, DM, HTN, AFIB on xeralto, h/o colon cancer s/p partial colectomy who presented to the [facility] on 11/23/13 with diffuse abdominal pain, vomiting, diarrhea. No radicular symptoms.  Symptoms onset on 11/22/13.   Work up in the ED demonstrated acute pancreatitis, with gallstones found on CT scan.  The patients pancreatitis and labs are steadily improving and thus general surgery was consulted about lap chole during this admission.  He currently has no pain, N/V.  He is tolerating a full liquid diet well, urinating well.    WBC is currently normal, ALT/AST 73/53, Alk phos 124, last lipase 649 (1/25), bilirubin normal at 0.9.  CT shows cholelithiasis, mild GB wall thickening likely related to acute pancreatitis, no evidence of biliary ductal dilatation.  No evidence of pancreatic necrosis, pseudocyst or complication.   ROS: All systems reviewed and otherwise negative except for as above  Family History  Problem Relation Age of Onset  . Hyperlipidemia Father   . Hypertension Father   . Heart disease Father   . Asthma Father   . Stroke Father   . Heart failure Father   . Heart disease Mother   . Heart failure Mother   . Heart attack Sister     Past Medical History  Diagnosis Date  . Asthma   . Hypertension   . Diabetes mellitus   . Cancer   . A-fib   . Hyperlipidemia   . Colon cancer   . Shortness of breath   . CHF (congestive heart failure) 10/28/2011  . Aortic stenosis     Mild  . Forgetfulness   . PVC's (premature ventricular contractions)   . Mild aortic insufficiency     Past Surgical History  Procedure Laterality Date  . Colon surgery      Partial hemecolectomy   . Central line insertion  09/08/2011         Social History:  reports that he has quit smoking. He does not have any smokeless tobacco history on file.  He reports that he does not drink alcohol or use illicit drugs.  Allergies: No Known Allergies  Medications Prior to Admission  Medication Sig Dispense Refill  . amLODipine (NORVASC) 5 MG tablet TAKE 1 TABLET EVERY DAY  90 tablet  1  . ferrous gluconate (FERGON) 325 MG tablet Take 325 mg by mouth at bedtime.      . fluticasone (FLONASE) 50 MCG/ACT nasal spray Place 2 sprays into the nose daily as needed for allergies or rhinitis.       . Fluticasone-Salmeterol (ADVAIR) 500-50 MCG/DOSE AEPB Inhale 1 puff into the lungs every 12 (twelve) hours. Prn asthma attack      . furosemide (LASIX) 20 MG tablet TAKE 1 TABLET EVERY DAY  90 tablet  1  . levothyroxine (SYNTHROID, LEVOTHROID) 25 MCG tablet TAKE 1 TABLET BY MOUTH EVERY DAY  90 tablet  1  . losartan (COZAAR) 50 MG tablet TAKE 1 TABLET EVERY DAY  30 tablet  5  . omeprazole (PRILOSEC) 40 MG capsule TAKE ONE CAPSULE EVERY DAY  30 capsule  12  . potassium chloride SA (K-DUR,KLOR-CON) 20 MEQ tablet Take 20 mEq by mouth 2 (two) times daily.      . pravastatin (PRAVACHOL) 20 MG tablet Take 20 mg by mouth daily.        Alveda Reasons 20 MG  TABS tablet ONE BY MOUTH DAILY WITH EVENING MEAL  90 tablet  1    Blood pressure 127/57, pulse 71, temperature 99.1 F (37.3 C), temperature source Oral, resp. rate 23, height 6' (1.829 m), weight 161 lb 13.1 oz (73.4 kg), SpO2 96.00%. Physical Exam: General: pleasant, very hard of hearing, WD/WN white male who is laying in bed in NAD HEENT: head is normocephalic, atraumatic.  Sclera are noninjected.  PERRL.  Ears and nose without any masses or lesions.  Mouth is pink and moist Heart: regular, rate, and rhythm.  No obvious murmurs, gallops, or rubs noted.  Palpable pedal pulses bilaterally Lungs: CTAB, no wheezes, rhonchi, or rales noted.  Respiratory effort nonlabored Abd: soft, NT/ND, +BS, no masses, hernias, or organomegaly, large horizontal mid abdominal scar spanning right lateral abdomen to left abdomen which is  well healed MS: all 4 extremities are symmetrical with no cyanosis, clubbing, or edema. Skin: warm and dry with no masses, lesions, or rashes Psych: A&Ox3 with an appropriate affect.  Results for orders placed during the hospital encounter of 11/23/13 (from the past 48 hour(s))  GLUCOSE, CAPILLARY     Status: None   Collection Time    11/24/13  5:51 PM      Result Value Range   Glucose-Capillary 87  70 - 99 mg/dL  GLUCOSE, CAPILLARY     Status: None   Collection Time    11/24/13  8:02 PM      Result Value Range   Glucose-Capillary 83  70 - 99 mg/dL   Comment 1 Notify RN    GLUCOSE, CAPILLARY     Status: None   Collection Time    11/25/13 12:12 AM      Result Value Range   Glucose-Capillary 83  70 - 99 mg/dL   Comment 1 Notify RN    COMPREHENSIVE METABOLIC PANEL     Status: Abnormal   Collection Time    11/25/13  3:07 AM      Result Value Range   Sodium 142  137 - 147 mEq/L   Potassium 3.8  3.7 - 5.3 mEq/L   Chloride 108  96 - 112 mEq/L   CO2 19  19 - 32 mEq/L   Glucose, Bld 76  70 - 99 mg/dL   BUN 18  6 - 23 mg/dL   Creatinine, Ser 1.06  0.50 - 1.35 mg/dL   Calcium 8.8  8.4 - 10.5 mg/dL   Total Protein 6.1  6.0 - 8.3 g/dL   Albumin 3.0 (*) 3.5 - 5.2 g/dL   AST 102 (*) 0 - 37 U/L   ALT 113 (*) 0 - 53 U/L   Alkaline Phosphatase 134 (*) 39 - 117 U/L   Total Bilirubin 1.6 (*) 0.3 - 1.2 mg/dL   GFR calc non Af Amer 61 (*) >90 mL/min   GFR calc Af Amer 71 (*) >90 mL/min   Comment: (NOTE)     The eGFR has been calculated using the CKD EPI equation.     This calculation has not been validated in all clinical situations.     eGFR's persistently <90 mL/min signify possible Chronic Kidney     Disease.  CBC     Status: Abnormal   Collection Time    11/25/13  3:07 AM      Result Value Range   WBC 8.3  4.0 - 10.5 K/uL   RBC 3.41 (*) 4.22 - 5.81 MIL/uL   Hemoglobin 10.3 (*) 13.0 - 17.0 g/dL  Comment: REPEATED TO VERIFY   HCT 30.7 (*) 39.0 - 52.0 %   MCV 90.0  78.0 - 100.0  fL   MCH 30.2  26.0 - 34.0 pg   MCHC 33.6  30.0 - 36.0 g/dL   RDW 15.7 (*) 11.5 - 15.5 %   Platelets 189  150 - 400 K/uL   Comment: REPEATED TO VERIFY  LIPASE, BLOOD     Status: Abnormal   Collection Time    11/25/13  3:07 AM      Result Value Range   Lipase 649 (*) 11 - 59 U/L  GLUCOSE, CAPILLARY     Status: None   Collection Time    11/25/13  4:29 AM      Result Value Range   Glucose-Capillary 71  70 - 99 mg/dL   Comment 1 Notify RN    GLUCOSE, CAPILLARY     Status: None   Collection Time    11/25/13  8:28 AM      Result Value Range   Glucose-Capillary 93  70 - 99 mg/dL  GLUCOSE, CAPILLARY     Status: Abnormal   Collection Time    11/25/13 12:14 PM      Result Value Range   Glucose-Capillary 110 (*) 70 - 99 mg/dL  GLUCOSE, CAPILLARY     Status: Abnormal   Collection Time    11/25/13  5:04 PM      Result Value Range   Glucose-Capillary 115 (*) 70 - 99 mg/dL  GLUCOSE, CAPILLARY     Status: Abnormal   Collection Time    11/25/13  8:07 PM      Result Value Range   Glucose-Capillary 161 (*) 70 - 99 mg/dL   Comment 1 Notify RN    COMPREHENSIVE METABOLIC PANEL     Status: Abnormal   Collection Time    11/26/13  3:00 AM      Result Value Range   Sodium 140  137 - 147 mEq/L   Potassium 3.6 (*) 3.7 - 5.3 mEq/L   Comment: HEMOLYSIS AT THIS LEVEL MAY AFFECT RESULT   Chloride 108  96 - 112 mEq/L   CO2 18 (*) 19 - 32 mEq/L   Glucose, Bld 127 (*) 70 - 99 mg/dL   BUN 12  6 - 23 mg/dL   Creatinine, Ser 0.83  0.50 - 1.35 mg/dL   Calcium 8.4  8.4 - 10.5 mg/dL   Total Protein 5.9 (*) 6.0 - 8.3 g/dL   Albumin 2.7 (*) 3.5 - 5.2 g/dL   AST 53 (*) 0 - 37 U/L   Comment: HEMOLYSIS AT THIS LEVEL MAY AFFECT RESULT   ALT 73 (*) 0 - 53 U/L   Comment: HEMOLYSIS AT THIS LEVEL MAY AFFECT RESULT   Alkaline Phosphatase 124 (*) 39 - 117 U/L   Total Bilirubin 0.9  0.3 - 1.2 mg/dL   GFR calc non Af Amer 77 (*) >90 mL/min   GFR calc Af Amer 89 (*) >90 mL/min   Comment: (NOTE)     The eGFR  has been calculated using the CKD EPI equation.     This calculation has not been validated in all clinical situations.     eGFR's persistently <90 mL/min signify possible Chronic Kidney     Disease.  GLUCOSE, CAPILLARY     Status: Abnormal   Collection Time    11/26/13  3:17 AM      Result Value Range   Glucose-Capillary 116 (*) 70 - 99 mg/dL  Comment 1 Notify RN    GLUCOSE, CAPILLARY     Status: Abnormal   Collection Time    11/26/13  7:47 AM      Result Value Range   Glucose-Capillary 127 (*) 70 - 99 mg/dL   Comment 1 Documented in Chart    GLUCOSE, CAPILLARY     Status: None   Collection Time    11/26/13 11:39 AM      Result Value Range   Glucose-Capillary 86  70 - 99 mg/dL   Comment 1 Documented in Chart     No results found.     Assessment/Plan Gallstone Pancreatitis Cholelithiasis AFIB on Xeralto - currently being held, but on lovenox H/o colon cancer s/p partial colectomy Other chronic medical conditions (CHF, HTN, DM, HLD, AS, AI)  Plan: 1.  Need to get cardiology to give cardiac risk assessment to see if they would even recommend him going under surgery 2.  The patient is against surgery unless he has no choice.  He'd rather go home.  Talked to his wife Deneise Lever) and friend of the family Santiago Glad) over the phone and they will get in touch with his daughter Neoma Laming who may make some of the decisions.  Could not reach Neoma Laming on her cell phone. 3.  Recheck CMET and lipase in am. 4.  Won't schedule for surgery tomorrow or make NPO at this point while we're trying to sort out if he wants surgery and if he's safe to undergo surgery.   5.  He's been on Xeralto, but was switched to Lovenox upon admission 6.  No evidence of acute cholecystitis at this point in time.  Will await cards and patient/family decision prior to proceeding.   Coralie Keens 11/26/2013, 4:13 PM Pager: [phone]

## 2013-11-26 NOTE — Consult Note (Signed)
Patient adamantly opposes surgery, but he probably needs cholecystectomy with IOC.  Has been on Xarelto, now on Lovenox.  Will check him again in the AM.  Need cardiac clearance even if we were to go ahead.   Kathryne Eriksson. Dahlia Bailiff, MD, [place] [phone] [facility]

## 2013-11-26 NOTE — Progress Notes (Signed)
Patients daughter, Dulce Sellar, HCPOA, called. She spoke with her mother and care giver about surgery PA calling. She does not want any surgery done on her father until she speaks with MD. I explained PA conversation with both patient and spouse. She said she will call back to floor RN tomorrow am to get an update about patient. Explained patient has a transfer order to a new floor.

## 2013-11-26 NOTE — Plan of Care (Cosign Needed)
K low- orally repleted.  River Ambrosio, ANP 

## 2013-11-26 NOTE — Progress Notes (Signed)
[place] TEAM 1 - Stepdown/ICU TEAM Progress Note  WAYBURN SHALER XBM:[id number] DOB: 08/19/1924 DOA: 11/23/2013 PCP: Odette Fraction, MD  Admit HPI / Brief Narrative: 80 y.o. male w/ no signif GI hx who presented to the ED with diffuse abdominal pain, vomiting, diarrhea. Symptoms onset 1 day prior. Work up in the ED demonstrated acute pancreatitis, likely gallstone related.  HPI/Subjective: Alert and doing well.  Tolerating current diet w/o difficulty.  Denies abdom pain at this time.  No nausea.    Assessment/Plan:  Acute gallstone pancreatitis  LFTs cont to improve - Tbil normalized - no evidence of biliary ductal dilatation on CT scan - advance diet further today - consult Gen Surgery to help determine timing of cholecystectomy  Cholelithiasis  No evidence of choledocholithiasis via CT - consult Gen Surgery to help determine timing of cholecystectomy  DM CBG well controlled at this time   HTN Well controlled at present   Chronic Afib  Rate controlled - on chronic Xarelto - changed to lovenox in case procedure required during this hospitalization - followed by Dr. Harrington Challenger  HLD  Mild AoS Volume status appears to be balanced   Hx colon CA s/p hemicolectomy  COPD w/ bronchiectasis Well compensated at this time   Code Status: NO CODE Family Communication: no family present at time of exam  Disposition Plan: stable for transfer to medical bed - Gen Surg to eval - if no cholecystectomy this hospitalization, probable d/c home in next 24-48hrs   Consultants: Gen Surg  Procedures: none  Antibiotics: none  DVT prophylaxis: Xarelto > lovenox   Objective: Blood pressure 127/57, pulse 71, temperature 99.1 F (37.3 C), temperature source Oral, resp. rate 23, height 6' (1.829 m), weight 73.4 kg (161 lb 13.1 oz), SpO2 96.00%.  Intake/Output Summary (Last 24 hours) at 11/26/13 1430 Last data filed at 11/26/13 1142  Gross per 24 hour  Intake   2343 ml  Output   1275  ml  Net   1068 ml   Exam: General: No acute respiratory distress Lungs: Clear to auscultation bilaterally without wheezes or crackles Cardiovascular: Regular rate without murmur gallop or rub  Abdomen: non tender including epigastrium, nondistended, soft, bowel sounds positive, no rebound, no ascites, no appreciable mass Extremities: No significant cyanosis, clubbing, or edema bilateral lower extremities  Data Reviewed: Basic Metabolic Panel:  Recent Labs Lab 11/23/13 1827 11/23/13 1835 11/24/13 1100 11/25/13 0307 11/26/13 0300  NA 142 142 145 142 140  K 4.3 4.1 3.8 3.8 3.6*  CL 104 106 111 108 108  CO2 21  --  21 19 18*  GLUCOSE 141* 140* 89 76 127*  BUN 25* 25* 19 18 12   CREATININE 1.10 1.20 1.03 1.06 0.83  CALCIUM 9.3  --  8.6 8.8 8.4  MG 1.8  --   --   --   --    Liver Function Tests:  Recent Labs Lab 11/23/13 1827 11/24/13 1100 11/25/13 0307 11/26/13 0300  AST 210* 141* 102* 53*  ALT 126* 140* 113* 73*  ALKPHOS 145* 119* 134* 124*  BILITOT 1.6* 2.0* 1.6* 0.9  PROT 7.2 5.8* 6.1 5.9*  ALBUMIN 3.8 3.0* 3.0* 2.7*    Recent Labs Lab 11/23/13 1827 11/25/13 0307  LIPASE >3000* 649*   CBC:  Recent Labs Lab 11/23/13 1827 11/23/13 1835 11/25/13 0307  WBC 16.1*  --  8.3  NEUTROABS 13.5*  --   --   HGB 12.5* 13.3 10.3*  HCT 36.1* 39.0 30.7*  MCV 90.3  --  90.0  PLT 250  --  189   CBG:  Recent Labs Lab 11/25/13 1704 11/25/13 2007 11/26/13 0317 11/26/13 0747 11/26/13 1139  GLUCAP 115* 161* 116* 127* 86    Recent Results (from the past 240 hour(s))  MRSA PCR SCREENING     Status: None   Collection Time    11/24/13 12:02 AM      Result Value Range Status   MRSA by PCR NEGATIVE  NEGATIVE Final   Comment:            The GeneXpert MRSA Assay (FDA     approved for NASAL specimens     only), is one component of a     comprehensive MRSA colonization     surveillance program. It is not     intended to diagnose MRSA     infection nor to guide or      monitor treatment for     MRSA infections.     Studies:  Recent x-ray studies have been reviewed in detail by the Attending Physician  Scheduled Meds:  Scheduled Meds: . enoxaparin (LOVENOX) injection  70 mg Subcutaneous Q12H  . insulin aspart  0-9 Units Subcutaneous TID WC  . levothyroxine  25 mcg Oral QAC breakfast  . mometasone-formoterol  2 puff Inhalation BID  . pantoprazole  80 mg Oral Daily  . sodium chloride  3 mL Intravenous Q12H    Time spent on care of this patient: 35 mins   [place]  [phone] Pager - Text Page per Shea Evans as per below:  On-Call/Text Page:      [email or web address]      password [email or web address]  If 7PM-7AM, please contact night-coverage [email or web address] Password [email or web address] 11/26/2013, 2:30 PM   LOS: 3 days

## 2013-11-26 NOTE — Progress Notes (Signed)
Utilization review completed.  

## 2013-11-26 NOTE — Progress Notes (Signed)
Patient transferred to [phone] with belongings at bedside.  Patient alert and oriented and denies pain.  VSS.  Oriented to unit and equipment with call bell in reach.  Will continue to monitor.

## 2013-11-27 ENCOUNTER — Telehealth: Payer: Self-pay | Admitting: Internal Medicine

## 2013-11-27 LAB — CBC
HEMATOCRIT: 28.2 % — AB (ref 39.0–52.0)
Hemoglobin: 10 g/dL — ABNORMAL LOW (ref 13.0–17.0)
MCH: 31.7 pg (ref 26.0–34.0)
MCHC: 35.5 g/dL (ref 30.0–36.0)
MCV: 89.5 fL (ref 78.0–100.0)
Platelets: 198 10*3/uL (ref 150–400)
RBC: 3.15 MIL/uL — ABNORMAL LOW (ref 4.22–5.81)
RDW: 15.3 % (ref 11.5–15.5)
WBC: 5.5 10*3/uL (ref 4.0–10.5)

## 2013-11-27 LAB — GLUCOSE, CAPILLARY
GLUCOSE-CAPILLARY: 141 mg/dL — AB (ref 70–99)
Glucose-Capillary: 132 mg/dL — ABNORMAL HIGH (ref 70–99)
Glucose-Capillary: 87 mg/dL (ref 70–99)
Glucose-Capillary: 103 mg/dL — ABNORMAL HIGH (ref 70–99)
Glucose-Capillary: 96 mg/dL (ref 70–99)

## 2013-11-27 LAB — COMPREHENSIVE METABOLIC PANEL
ALBUMIN: 2.7 g/dL — AB (ref 3.5–5.2)
ALK PHOS: 130 U/L — AB (ref 39–117)
ALT: 50 U/L (ref 0–53)
AST: 27 U/L (ref 0–37)
BUN: 8 mg/dL (ref 6–23)
CHLORIDE: 109 meq/L (ref 96–112)
CO2: 22 mEq/L (ref 19–32)
Calcium: 8.7 mg/dL (ref 8.4–10.5)
Creatinine, Ser: 0.9 mg/dL (ref 0.50–1.35)
GFR calc Af Amer: 86 mL/min — ABNORMAL LOW (ref >90)
GFR calc non Af Amer: 74 mL/min — ABNORMAL LOW (ref >90)
Glucose, Bld: 99 mg/dL (ref 70–99)
POTASSIUM: 3.6 meq/L — AB (ref 3.7–5.3)
SODIUM: 143 meq/L (ref 137–147)
TOTAL PROTEIN: 5.8 g/dL — AB (ref 6.0–8.3)
Total Bilirubin: 0.7 mg/dL (ref 0.3–1.2)

## 2013-11-27 LAB — LIPASE, BLOOD: LIPASE: 60 U/L — AB (ref 11–59)

## 2013-11-27 MED ORDER — POTASSIUM CHLORIDE CRYS ER 20 MEQ PO TBCR
40.0000 meq | EXTENDED_RELEASE_TABLET | Freq: Once | ORAL | Status: AC
  Administered 2013-11-27: 40 meq via ORAL
  Filled 2013-11-27: qty 2

## 2013-11-27 MED ORDER — RIVAROXABAN 20 MG PO TABS
20.0000 mg | ORAL_TABLET | Freq: Every day | ORAL | Status: DC
  Administered 2013-11-27: 20 mg via ORAL
  Filled 2013-11-27 (×2): qty 1

## 2013-11-27 NOTE — Progress Notes (Signed)
ANTICOAGULATION CONSULT NOTE - Follow-up Consult  Pharmacy Consult for Lovenox Indication: atrial fibrillation  No Known Allergies  Patient Measurements: Height: 6' (182.9 cm) Weight: 161 lb 13.1 oz (73.4 kg) IBW/kg (Calculated) : 77.6  Vital Signs: Temp: 98.7 F (37.1 C) (01/27 0536) Temp src: Oral (01/27 0536) BP: 148/51 mmHg (01/27 0536) Pulse Rate: 55 (01/27 0544)  Labs:  Recent Labs  11/25/13 0307 11/26/13 0300 11/27/13 0510  HGB 10.3*  --  10.0*  HCT 30.7*  --  28.2*  PLT 189  --  198  CREATININE 1.06 0.83 0.90    Estimated Creatinine Clearance: 60 ml/min (by C-G formula based on Cr of 0.9).  Assessment: 42 YOM on Xarelto PTA due to atrial fibrillation. 1/24 MD stopped Xarelto and changed to Lovenox 1mg /kg q12 in anticipation of pt needing surgery sometime this admit.  Goal of Therapy:  Anti-Xa level 0.6-1 units/ml 4hrs after LMWH dose given as needed Monitor platelets by anticoagulation protocol: Yes   Plan:  1) Lovenox 70mg  SQ q12h. Note due 1/30. 2) CBC q72h while on lovenox 3) F/u plans for surgery  Sherlon Handing, PharmD, BCPS Clinical pharmacist, pager [phone] 11/27/2013,8:46 AM

## 2013-11-27 NOTE — Evaluation (Signed)
Physical Therapy Evaluation Patient Details Name: Todd [name] MRN: [id number] DOB: 11/01/1924 Today's Date: 11/27/2013 Time: 0932-3557 PT Time Calculation (min): 16 min  PT Assessment / Plan / Recommendation History of Present Illness  pt presents with abdominal pain with Gallstone and Pancreatitis.    Clinical Impression  Pt a little unsteady and needs cueing for safety and balance.  If family is able to provide 24hr S for pt then he is safe to return to home with HHPT.  Will continue to follow.      PT Assessment  Patient needs continued PT services    Follow Up Recommendations  Home health PT;Supervision/Assistance - 24 hour    Does the patient have the potential to tolerate intense rehabilitation      Barriers to Discharge        Equipment Recommendations  None recommended by PT    Recommendations for Other Services     Frequency Min 3X/week    Precautions / Restrictions Precautions Precautions: Fall Restrictions Weight Bearing Restrictions: No   Pertinent Vitals/Pain Denied pain.        Mobility  Bed Mobility Overal bed mobility: Needs Assistance Bed Mobility: Supine to Sit Supine to sit: Supervision;HOB elevated General bed mobility comments: pt utilizes bed rail to A with coming to sit.   Transfers Overall transfer level: Needs assistance Equipment used: Rolling walker (2 wheeled) Transfers: Sit to/from Stand Sit to Stand: Min assist General transfer comment: cues for UE use and controling descent to sitting.   Ambulation/Gait Ambulation/Gait assistance: Min guard Ambulation Distance (Feet): 160 Feet Assistive device: Rolling walker (2 wheeled) Gait Pattern/deviations: Step-through pattern;Decreased stride length;Trunk flexed;Shuffle General Gait Details: pt requires A for balance, RW management and safety.      Exercises     PT Diagnosis: Difficulty walking;Generalized weakness  PT Problem List: Decreased strength;Decreased activity  tolerance;Decreased balance;Decreased mobility;Decreased coordination;Decreased knowledge of use of DME;Cardiopulmonary status limiting activity PT Treatment Interventions: DME instruction;Gait training;Stair training;Functional mobility training;Therapeutic activities;Therapeutic exercise;Balance training;Patient/family education     PT Goals(Current goals can be found in the care plan section) Acute Rehab PT Goals Patient Stated Goal: Home PT Goal Formulation: With patient Time For Goal Achievement: 12/11/13 Potential to Achieve Goals: Fair  Visit Information  Last PT Received On: 11/27/13 Assistance Needed: +1 History of Present Illness: pt presents with abdominal pain with Gallstone and Pancreatitis.         Prior [place] expects to be discharged to:: Private residence Living Arrangements: Spouse/significant other Available Help at Discharge: Family;Personal care attendant;Available 24 hours/day Type of Home: House Home Access: Stairs to enter [employer] of Steps: "A step or two".   Home Layout: One level Home Equipment: Walker - 2 wheels;Kasandra Knudsen - single point Additional Comments: pt indicates he and his wife have "girl" who comes "every day but Sundays" to A him and wife with homemaking and unsure about ADLs.  pt has a daughter, but unsure if she lives with pt or nearby.   Prior Function Level of Independence: Needs assistance Gait / Transfers Assistance Needed: pt indicates he normally uses his "walking stick".   ADL's / Homemaking Assistance Needed: pt indicates they have a "girl" who performs homemaking, but unsure if she A's with ADLs as well.   Communication / Swallowing Assistance Needed: HOH Communication Communication: HOH (pt indicates R ear is better than L.  )    Cognition  Cognition Arousal/Alertness: Awake/alert Behavior During Therapy: WFL for tasks assessed/performed Overall Cognitive Status: Within  Functional  Limits for tasks assessed    Extremity/Trunk Assessment Upper Extremity Assessment Upper Extremity Assessment: Generalized weakness Lower Extremity Assessment Lower Extremity Assessment: Generalized weakness Cervical / Trunk Assessment Cervical / Trunk Assessment: Kyphotic   Balance Balance Overall balance assessment: Needs assistance Standing balance support: Bilateral upper extremity supported Standing balance-Leahy Scale: Fair  End of Session PT - End of Session Equipment Utilized During Treatment: Gait belt Activity Tolerance: Patient tolerated treatment well Patient left: in chair;with call bell/phone within reach Nurse Communication: Mobility status  GP     Catarina Hartshorn, [place] 11/27/2013, 2:42 PM

## 2013-11-27 NOTE — Progress Notes (Signed)
[place] TEAM 1 - Stepdown/ICU TEAM Progress Note  Todd [name] RKY:[id number] DOB: 02-24-1924 DOA: 11/23/2013 PCP: Odette Fraction, MD  Admit HPI / Brief Narrative: 80 y.o. male w/ no signif GI hx who presented to the ED with diffuse abdominal pain, vomiting, diarrhea. Symptoms onset 1 day prior. Work up in the ED demonstrated acute pancreatitis, likely gallstone related.  HPI/Subjective: Alert and doing well. Continues to tolerate full liquids. Declining cholecystectomy.    Assessment/Plan:  Acute gallstone pancreatitis  LFTs cont to improve - Tbil normalized - no evidence of biliary ductal dilatation on CT scan - advance diet further today   Cholelithiasis  No evidence of choledocholithiasis via CT -  - Gen Surgery has evaluated pt who is declining a cholecystectomy at this time- d/w patient and daughter who is in the room - will resume Xarelto and place on low fat diet.   DM CBG well controlled at this time   HTN Well controlled at present   Chronic Afib  Rate controlled - on chronic Xarelto - changed to lovenox in case procedure required during this hospitalization- will revert back to Xarelto - followed by Dr. Harrington Challenger  HLD  Mild AoS Volume status appears to be balanced   Hx colon CA s/p hemicolectomy  COPD w/ bronchiectasis Well compensated at this time   Code Status: NO CODE Family Communication: daughter at bedside Disposition Plan:  probable d/c home in next 24 hrs   Consultants: Gen Surg  Procedures: none  Antibiotics: none  DVT prophylaxis: Xarelto > lovenox   Objective: Blood pressure 150/75, pulse 91, temperature 98 F (36.7 C), temperature source Oral, resp. rate 24, height 6' (1.829 m), weight 73.4 kg (161 lb 13.1 oz), SpO2 98.00%.  Intake/Output Summary (Last 24 hours) at 11/27/13 1333 Last data filed at 11/27/13 1233  Gross per 24 hour  Intake    900 ml  Output   2115 ml  Net  -1215 ml   Exam: General: No acute respiratory  distress Lungs: Clear to auscultation bilaterally without wheezes or crackles Cardiovascular: Regular rate without murmur gallop or rub  Abdomen: non tender including epigastrium, nondistended, soft, bowel sounds positive, no rebound, no ascites, no appreciable mass Extremities: No significant cyanosis, clubbing, or edema bilateral lower extremities  Data Reviewed: Basic Metabolic Panel:  Recent Labs Lab 11/23/13 1827 11/23/13 1835 11/24/13 1100 11/25/13 0307 11/26/13 0300 11/27/13 0510  NA 142 142 145 142 140 143  K 4.3 4.1 3.8 3.8 3.6* 3.6*  CL 104 106 111 108 108 109  CO2 21  --  21 19 18* 22  GLUCOSE 141* 140* 89 76 127* 99  BUN 25* 25* 19 18 12 8   CREATININE 1.10 1.20 1.03 1.06 0.83 0.90  CALCIUM 9.3  --  8.6 8.8 8.4 8.7  MG 1.8  --   --   --   --   --    Liver Function Tests:  Recent Labs Lab 11/23/13 1827 11/24/13 1100 11/25/13 0307 11/26/13 0300 11/27/13 0510  AST 210* 141* 102* 53* 27  ALT 126* 140* 113* 73* 50  ALKPHOS 145* 119* 134* 124* 130*  BILITOT 1.6* 2.0* 1.6* 0.9 0.7  PROT 7.2 5.8* 6.1 5.9* 5.8*  ALBUMIN 3.8 3.0* 3.0* 2.7* 2.7*    Recent Labs Lab 11/23/13 1827 11/25/13 0307 11/27/13 0510  LIPASE >3000* 649* 60*   CBC:  Recent Labs Lab 11/23/13 1827 11/23/13 1835 11/25/13 0307 11/27/13 0510  WBC 16.1*  --  8.3 5.5  NEUTROABS  13.5*  --   --   --   HGB 12.5* 13.3 10.3* 10.0*  HCT 36.1* 39.0 30.7* 28.2*  MCV 90.3  --  90.0 89.5  PLT 250  --  189 198   CBG:  Recent Labs Lab 11/26/13 1139 11/26/13 1627 11/26/13 2133 11/27/13 0828 11/27/13 1158  GLUCAP 86 108* 119* 87 103*    Recent Results (from the past 240 hour(s))  MRSA PCR SCREENING     Status: None   Collection Time    11/24/13 12:02 AM      Result Value Range Status   MRSA by PCR NEGATIVE  NEGATIVE Final   Comment:            The GeneXpert MRSA Assay (FDA     approved for NASAL specimens     only), is one component of a     comprehensive MRSA colonization      surveillance program. It is not     intended to diagnose MRSA     infection nor to guide or     monitor treatment for     MRSA infections.     Studies:  Recent x-ray studies have been reviewed in detail by the Attending Physician  Scheduled Meds:  Scheduled Meds: . enoxaparin (LOVENOX) injection  70 mg Subcutaneous Q12H  . insulin aspart  0-9 Units Subcutaneous TID WC  . levothyroxine  25 mcg Oral QAC breakfast  . mometasone-formoterol  2 puff Inhalation BID  . pantoprazole  80 mg Oral Daily  . sodium chloride  3 mL Intravenous Q12H    Time spent on care of this patient: 77 mins   Debbe Odea, MD  [facility] Office  [phone] Pager - Text Page per Shea Evans as per below:  On-Call/Text Page:      [email or web address]      password [email or web address]  If 7PM-7AM, please contact night-coverage [email or web address] Password [email or web address] 11/27/2013, 1:33 PM   LOS: 4 days

## 2013-11-27 NOTE — Progress Notes (Signed)
  Subjective: He is incontinent this Am, No complaints of pain.  He says he feels better and wants to know if he is ready to go home.  Objective: Vital signs in last 24 hours: Temp:  [97.6 F (36.4 C)-99.1 F (37.3 C)] 98.7 F (37.1 C) (01/27 0536) Pulse Rate:  [42-97] 55 (01/27 0544) Resp:  [22-23] 22 (01/27 0536) BP: (107-148)/(51-78) 148/51 mmHg (01/27 0536) SpO2:  [93 %-100 %] 97 % (01/27 0536) Last BM Date: 11/26/13 1080 po RECORDED, +BM Afebrile, VSS, some bradycardia recorded. K+ 3.6, Lipase down to 60, LFT's OK Full liquid diet Intake/Output from previous day: 01/26 0701 - 01/27 0700 In: 1083 [P.O.:1080; I.V.:3] Out: 2090 [Urine:2090] Intake/Output this shift: Total I/O In: -  Out: 200 [Urine:200]  General appearance: alert, cooperative, no distress and Very hard of hearing Resp: clear to auscultation bilaterally GI: soft, non-tender; bowel sounds normal; no masses,  no organomegaly  Lab Results:   Recent Labs  11/25/13 0307 11/27/13 0510  WBC 8.3 5.5  HGB 10.3* 10.0*  HCT 30.7* 28.2*  PLT 189 198    BMET  Recent Labs  11/26/13 0300 11/27/13 0510  NA 140 143  K 3.6* 3.6*  CL 108 109  CO2 18* 22  GLUCOSE 127* 99  BUN 12 8  CREATININE 0.83 0.90  CALCIUM 8.4 8.7   PT/INR No results found for this basename: LABPROT, INR,  in the last 72 hours   Recent Labs Lab 11/23/13 1827 11/24/13 1100 11/25/13 0307 11/26/13 0300 11/27/13 0510  AST 210* 141* 102* 53* 27  ALT 126* 140* 113* 73* 50  ALKPHOS 145* 119* 134* 124* 130*  BILITOT 1.6* 2.0* 1.6* 0.9 0.7  PROT 7.2 5.8* 6.1 5.9* 5.8*  ALBUMIN 3.8 3.0* 3.0* 2.7* 2.7*     Lipase     Component Value Date/Time   LIPASE 60* 11/27/2013 0510     Studies/Results: No results found.  Medications: . enoxaparin (LOVENOX) injection  70 mg Subcutaneous Q12H  . insulin aspart  0-9 Units Subcutaneous TID WC  . levothyroxine  25 mcg Oral QAC breakfast  . mometasone-formoterol  2 puff Inhalation BID   . pantoprazole  80 mg Oral Daily  . sodium chloride  3 mL Intravenous Q12H    Assessment/Plan Gallstone pancreatitis CHF  AODM Afib on chronic  Anticoagulation Hypertension Hx of CHF Hx of colon cancer with partial colectomy Hypothyroid COPD  Hard of hearing  Plan:  He feels better, he doesn't know when he had Xarelto last, I can see he has not had it since 11/25/13, here in the hospital.  We usually wait 5 days Factor X drugs to clear.  Notes from nurse indicate the daughter and Chauncey Reading will need to be seen and care discussed. He will also need cardiac clearance.    LOS: 4 days    Grecia Lynk 11/27/2013

## 2013-11-27 NOTE — Telephone Encounter (Signed)
Spoke with pt dtr, the pt is currently in the hosp and may need to have gallbladder surgery. The dtr wants to make sure we see the pt and make sure he is okay for surgery. Spoke with Wannetta Sender, Engineer, maintenance (IT) at the [facility], she will check into this for the family.

## 2013-11-27 NOTE — Progress Notes (Signed)
Patient feels fine and does not want surgery.  He may or may not be completely with it, but he was reading the newspaper, and apparently is just hard of hearing.  Until his decision can be legally overridden, we will abide by his wishes.  Will sign off.  Kathryne Eriksson. Dahlia Bailiff, MD, [place] [phone] [facility]

## 2013-11-27 NOTE — Telephone Encounter (Signed)
New Prob   States pt is in the hospital and they are considering surgery, but want to verify cardiac clearance before moving forward. Please call.

## 2013-11-28 DIAGNOSIS — R609 Edema, unspecified: Secondary | ICD-10-CM

## 2013-11-28 DIAGNOSIS — E785 Hyperlipidemia, unspecified: Secondary | ICD-10-CM

## 2013-11-28 LAB — BASIC METABOLIC PANEL
BUN: 12 mg/dL (ref 6–23)
CO2: 22 mEq/L (ref 19–32)
Calcium: 8.8 mg/dL (ref 8.4–10.5)
Chloride: 108 mEq/L (ref 96–112)
Creatinine, Ser: 0.96 mg/dL (ref 0.50–1.35)
GFR calc Af Amer: 84 mL/min — ABNORMAL LOW (ref >90)
GFR calc non Af Amer: 72 mL/min — ABNORMAL LOW (ref >90)
Glucose, Bld: 96 mg/dL (ref 70–99)
Potassium: 4 mEq/L (ref 3.7–5.3)
Sodium: 142 mEq/L (ref 137–147)

## 2013-11-28 LAB — GLUCOSE, CAPILLARY: Glucose-Capillary: 97 mg/dL (ref 70–99)

## 2013-11-28 NOTE — Discharge Summary (Signed)
Physician Discharge Summary  Todd [name] [id number] DOB: [birth year] DOA: 11/23/2013  PCP: Odette Fraction, MD  Admit date: 11/23/2013 Discharge date: 11/28/2013  Time spent: 35 minutes  Recommendations for Outpatient Follow-up:  Patient is to follow up with his primary care physician within one week of discharge. He should have his potassium monitored. Patient should also continue his medications as prescribed.   Discharge Diagnoses:  Principal Problem:   Acute Gallstone pancreatitis, resolved Other: Cholelithiasis, does not wish for intervention Diabetes mellitus, stable Hypertension, stable Chronic atrial fibrillation, stable continue Inderal to Hyperlipidemia, stable History of colon cancer status post hemicolectomy, stable COPD with bronchiectasis, stable   Discharge Condition: Stable  Diet recommendation: Heart healthy  Filed Weights   11/23/13 2344 11/25/13 0430  Weight: 70.2 kg (154 lb 12.2 oz) 73.4 kg (161 lb 13.1 oz)    History of present illness:  Todd [name] is a 80 y.o. male who presents to the ED with abdominal pain, vomiting, diarrhea. Symptoms onset 1 day ago, worsening today. No chest pain but does have SOB. Abd pain is diffuse. Work up in the ED demonstrates acute pancreatitis, likely gallstone pancreatitis.   Hospital Course:  Acute gallstone pancreatitis  -LFTs continue to improve  -Tbili normalized - no evidence of biliary ductal dilatation on CT scan  -Tolerating normal diet  Cholelithiasis  No evidence of choledocholithiasis via CT -  - Gen Surgery has evaluated patient who is declining a cholecystectomy at this time-  Diabetes mellitus  CBG well controlled at this time   Hypertension Well controlled at present, will restart patient's home medications   Chronic Afib  Rate controlled - on chronic Xarelto - followed by Dr. Harrington Challenger   Hyperlipidemia Continue statin  Mild AoS  Volume status appears to be balanced   Hx colon CA  s/p hemicolectomy, stable   COPD w/ bronchiectasis  Well compensated at this time    Procedures:  None  Consultations:  General surgery  Discharge Exam: Filed Vitals:   11/28/13 0539  BP: 131/61  Pulse: 53  Temp: 97.1 F (36.2 C)  Resp: 20     General: Well developed, well nourished, NAD, appears stated age  HEENT: NCAT, PERRLA, EOMI, Anicteic Sclera, mucous membranes moist. No pharyngeal erythema or exudates  Neck: Supple, no JVD, no masses  Cardiovascular: S1 S2 auscultated, no rubs, murmurs or gallops. Regular rate and rhythm.  Respiratory: Clear to auscultation bilaterally with equal chest rise  Abdomen: Soft, nontender, nondistended, + bowel sounds  Extremities: warm dry without cyanosis clubbing or edema  Neuro: AAOx3, cranial nerves grossly intact. Strength 5/5 in patient's upper and lower extremities bilaterally  Skin: Without rashes exudates or nodules  Psych: Normal affect and demeanor with intact judgement and insight  Discharge Instructions  Discharge Orders   Future Orders Complete By Expires   Diet - low sodium heart healthy  As directed    Discharge instructions  As directed    Comments:     Patient is to follow up with his primary care physician within one week of discharge. He should have his potassium monitored. Patient should also continue his medications as prescribed.   Increase activity slowly  As directed        Medication List         amLODipine 5 MG tablet  Commonly known as:  NORVASC  TAKE 1 TABLET EVERY DAY     ferrous gluconate 325 MG tablet  Commonly known as:  FERGON  Take 325 mg  by mouth at bedtime.     fluticasone 50 MCG/ACT nasal spray  Commonly known as:  FLONASE  Place 2 sprays into the nose daily as needed for allergies or rhinitis.     Fluticasone-Salmeterol 500-50 MCG/DOSE Aepb  Commonly known as:  ADVAIR  Inhale 1 puff into the lungs every 12 (twelve) hours. Prn asthma attack     furosemide 20 MG tablet   Commonly known as:  LASIX  TAKE 1 TABLET EVERY DAY     levothyroxine 25 MCG tablet  Commonly known as:  SYNTHROID, LEVOTHROID  TAKE 1 TABLET BY MOUTH EVERY DAY     losartan 50 MG tablet  Commonly known as:  COZAAR  TAKE 1 TABLET EVERY DAY     omeprazole 40 MG capsule  Commonly known as:  PRILOSEC  TAKE ONE CAPSULE EVERY DAY     potassium chloride SA 20 MEQ tablet  Commonly known as:  K-DUR,KLOR-CON  Take 20 mEq by mouth 2 (two) times daily.     pravastatin 20 MG tablet  Commonly known as:  PRAVACHOL  Take 20 mg by mouth daily.     XARELTO 20 MG Tabs tablet  Generic drug:  Rivaroxaban  ONE BY MOUTH DAILY WITH EVENING MEAL       No Known Allergies     Follow-up Information   Follow up with [place], MD. Schedule an appointment as soon as possible for a visit in 1 week.   Specialty:  Family Medicine   Contact information:   [address] [place] [address] [phone]        The results of significant diagnostics from this hospitalization (including imaging, microbiology, ancillary and laboratory) are listed below for reference.    Significant Diagnostic Studies: Ct Abdomen Pelvis W Contrast  11/23/2013   CLINICAL DATA:  Abdominal pain.  Vomiting.  Diarrhea.  EXAM: CT ABDOMEN AND PELVIS WITH CONTRAST  TECHNIQUE: Multidetector CT imaging of the abdomen and pelvis was performed using the standard protocol following bolus administration of intravenous contrast.  CONTRAST:  31mL OMNIPAQUE IOHEXOL 300 MG/ML  SOLN  COMPARISON:  PET-CT on 12/20/2006  FINDINGS: Cholelithiasis is demonstrated. Mild gallbladder wall thickening is seen without evidence of pericholecystic inflammatory changes or fluid. There is no evidence of biliary ductal dilatation. No liver masses are identified.  There is mild diffuse pancreatic edema and peripancreatic inflammatory changes, consistent with acute pancreatitis. No evidence of pancreatic pseudocysts or mass. No evidence of  pancreatic necrosis.  The spleen and adrenal glands are normal in appearance. Several small renal cysts are seen bilaterally however there is no evidence of renal masses or hydronephrosis. No other soft tissue masses or lymphadenopathy identified within the abdomen or pelvis.  Diverticulosis is seen involving the sigmoid colon, however there is no evidence of diverticulitis. No evidence of bowel wall thickening or dilatation. Images through the lung bases show bibasilar scarring. Pulmonary nodule in the inferior aspect of the right middle lobe is stable since prior PET-CT, consistent with benign etiology. No suspicious bone lesions identified.1  IMPRESSION: Mild acute pancreatitis. No evidence of pancreatic necrosis, pseudocyst, or other complication.  Cholelithiasis. Mild gallbladder wall thickening is likely related to acute pancreatitis. No evidence of biliary ductal dilatation.  Diverticulosis. No radiographic evidence of diverticulitis.   Electronically Signed   By: Earle Gell M.D.   On: 11/23/2013 20:20   Dg Chest Portable 1 View  11/23/2013   CLINICAL DATA:  Fatigue  EXAM: PORTABLE CHEST - 1  VIEW  COMPARISON:  09/18/2011  FINDINGS: Low lung volumes and bibasilar atelectasis. Mild cardiomegaly. Chronic right upper lobe opacities. Chronic left basilar opacities.  IMPRESSION: Cardiomegaly  Bibasilar atelectasis.  Chronic opacities in the right upper lobe and left base.   Electronically Signed   By: Maryclare Bean M.D.   On: 11/23/2013 19:01    Microbiology: Recent Results (from the past 240 hour(s))  MRSA PCR SCREENING     Status: None   Collection Time    11/24/13 12:02 AM      Result Value Range Status   MRSA by PCR NEGATIVE  NEGATIVE Final   Comment:            The GeneXpert MRSA Assay (FDA     approved for NASAL specimens     only), is one component of a     comprehensive MRSA colonization     surveillance program. It is not     intended to diagnose MRSA     infection nor to guide or      monitor treatment for     MRSA infections.     Labs: Basic Metabolic Panel:  Recent Labs Lab 11/23/13 1827  11/24/13 1100 11/25/13 0307 11/26/13 0300 11/27/13 0510 11/28/13 0529  NA 142  < > 145 142 140 143 142  K 4.3  < > 3.8 3.8 3.6* 3.6* 4.0  CL 104  < > 111 108 108 109 108  CO2 21  --  21 19 18* 22 22  GLUCOSE 141*  < > 89 76 127* 99 96  BUN 25*  < > 19 18 12 8 12   CREATININE 1.10  < > 1.03 1.06 0.83 0.90 0.96  CALCIUM 9.3  --  8.6 8.8 8.4 8.7 8.8  MG 1.8  --   --   --   --   --   --   < > = values in this interval not displayed. Liver Function Tests:  Recent Labs Lab 11/23/13 1827 11/24/13 1100 11/25/13 0307 11/26/13 0300 11/27/13 0510  AST 210* 141* 102* 53* 27  ALT 126* 140* 113* 73* 50  ALKPHOS 145* 119* 134* 124* 130*  BILITOT 1.6* 2.0* 1.6* 0.9 0.7  PROT 7.2 5.8* 6.1 5.9* 5.8*  ALBUMIN 3.8 3.0* 3.0* 2.7* 2.7*    Recent Labs Lab 11/23/13 1827 11/25/13 0307 11/27/13 0510  LIPASE >3000* 649* 60*   No results found for this basename: AMMONIA,  in the last 168 hours CBC:  Recent Labs Lab 11/23/13 1827 11/23/13 1835 11/25/13 0307 11/27/13 0510  WBC 16.1*  --  8.3 5.5  NEUTROABS 13.5*  --   --   --   HGB 12.5* 13.3 10.3* 10.0*  HCT 36.1* 39.0 30.7* 28.2*  MCV 90.3  --  90.0 89.5  PLT 250  --  189 198   Cardiac Enzymes: No results found for this basename: CKTOTAL, CKMB, CKMBINDEX, TROPONINI,  in the last 168 hours BNP: BNP (last 3 results) No results found for this basename: PROBNP,  in the last 8760 hours CBG:  Recent Labs Lab 11/27/13 0828 11/27/13 1158 11/27/13 1734 11/27/13 2158 11/28/13 0836  GLUCAP 87 103* 96 141* 97       Signed:  Zakariye Nee  [facility] 11/28/2013, 10:13 AM

## 2013-11-28 NOTE — Discharge Instructions (Signed)
Cholelithiasis °Cholelithiasis (also called gallstones) is a form of gallbladder disease in which gallstones form in your gallbladder. The gallbladder is an organ that stores bile made in the liver, which helps digest fats. Gallstones begin as small crystals and slowly grow into stones. Gallstone pain occurs when the gallbladder spasms and a gallstone is blocking the duct. Pain can also occur when a stone passes out of the duct.  °RISK FACTORS °· Being male.   °· Having multiple pregnancies. Health care providers sometimes advise removing diseased gallbladders before future pregnancies.   °· Being obese. °· Eating a diet heavy in fried foods and fat.   °· Being older than 60 years and increasing age.   °· Prolonged use of medicines containing male hormones.   °· Having diabetes mellitus.   °· Rapidly losing weight.   °· Having a family history of gallstones (heredity).   °SYMPTOMS °· Nausea.   °· Vomiting. °· Abdominal pain.   °· Yellowing of the skin (jaundice).   °· Sudden pain. It may persist from several minutes to several hours. °· Fever.   °· Tenderness to the touch.  °In some cases, when gallstones do not move into the bile duct, people have no pain or symptoms. These are called "silent" gallstones.  °TREATMENT °Silent gallstones do not need treatment. In severe cases, emergency surgery may be required. Options for treatment include: °· Surgery to remove the gallbladder. This is the most common treatment. °· Medicines. These do not always work and may take 6 12 months or more to work. °· Shock wave treatment (extracorporeal biliary lithotripsy). In this treatment an ultrasound machine sends shock waves to the gallbladder to break gallstones into smaller pieces that can pass into the intestines or be dissolved by medicine. °HOME CARE INSTRUCTIONS  °· Only take over-the-counter or prescription medicines for pain, discomfort, or fever as directed by your health care provider.   °· Follow a low-fat diet until  seen again by your health care provider. Fat causes the gallbladder to contract, which can result in pain.   °· Follow up with your health care provider as directed. Attacks are almost always recurrent and surgery is usually required for permanent treatment.   °SEEK IMMEDIATE MEDICAL CARE IF:  °· Your pain increases and is not controlled by medicines.   °· You have a fever or persistent symptoms for more than 2 3 days.   °· You have a fever and your symptoms suddenly get worse.   °· You have persistent nausea and vomiting.   °MAKE SURE YOU:  °· Understand these instructions. °· Will watch your condition. °· Will get help right away if you are not doing well or get worse. °Document Released: 10/14/2005 Document Revised: 06/20/2013 Document Reviewed: 04/11/2013 °[employer]® Patient Information ©2014 [employer]. ° °

## 2013-12-02 ENCOUNTER — Other Ambulatory Visit: Payer: Self-pay | Admitting: Family Medicine

## 2013-12-06 ENCOUNTER — Ambulatory Visit (INDEPENDENT_AMBULATORY_CARE_PROVIDER_SITE_OTHER): Payer: Medicare Other | Admitting: Family Medicine

## 2013-12-06 ENCOUNTER — Encounter: Payer: Self-pay | Admitting: Family Medicine

## 2013-12-06 VITALS — BP 142/80 | HR 66 | Temp 98.7°F | Resp 22 | Ht 67.0 in | Wt 157.0 lb

## 2013-12-06 DIAGNOSIS — Z09 Encounter for follow-up examination after completed treatment for conditions other than malignant neoplasm: Secondary | ICD-10-CM

## 2013-12-06 NOTE — Progress Notes (Signed)
Subjective:    Patient ID: Todd [name], male    DOB: [birth year], 80 y.o.   MRN: [id number]  HPI  Patient is here for hospital discharge followup. He is admitted at the end of January for gallstone pancreatitis. Fortunately this resolved spontaneously and did not require surgical intervention. The patient refused cholecystectomy. At the present time he denies any abdominal pain. He is eating egg sandwiches with mayonnaise without any discomfort. He denies any fevers or chills. He is not jaundiced. He denies any itching of the skin. He denies any right upper quadrant pain. He denies any symptoms of biliary colic. He denies any cough shortness of breath or chest pain. Discharge recommendations IVP and CMP to evaluate his liver function tests as well as his potassium was elevated in the hospital. Otherwise the patient is asymptomatic today. Past Medical History  Diagnosis Date  . Asthma   . Hypertension   . Diabetes mellitus   . Cancer   . A-fib   . Hyperlipidemia   . Colon cancer   . Shortness of breath   . CHF (congestive heart failure) 10/28/2011  . Aortic stenosis     Mild  . Forgetfulness   . PVC's (premature ventricular contractions)   . Mild aortic insufficiency    Current Outpatient Prescriptions on File Prior to Visit  Medication Sig Dispense Refill  . amLODipine (NORVASC) 5 MG tablet TAKE 1 TABLET EVERY DAY  90 tablet  1  . ferrous gluconate (FERGON) 325 MG tablet Take 325 mg by mouth at bedtime.      . ferrous sulfate 325 (65 FE) MG tablet TAKE 1 TABLET EVERY DAY  30 tablet  11  . fluticasone (FLONASE) 50 MCG/ACT nasal spray Place 2 sprays into the nose daily as needed for allergies or rhinitis.       . Fluticasone-Salmeterol (ADVAIR) 500-50 MCG/DOSE AEPB Inhale 1 puff into the lungs every 12 (twelve) hours. Prn asthma attack      . furosemide (LASIX) 20 MG tablet TAKE 1 TABLET EVERY DAY  90 tablet  1  . levothyroxine (SYNTHROID, LEVOTHROID) 25 MCG tablet TAKE 1 TABLET BY  MOUTH EVERY DAY  90 tablet  1  . losartan (COZAAR) 50 MG tablet TAKE 1 TABLET EVERY DAY  30 tablet  5  . omeprazole (PRILOSEC) 40 MG capsule TAKE ONE CAPSULE EVERY DAY  30 capsule  12  . potassium chloride SA (K-DUR,KLOR-CON) 20 MEQ tablet Take 20 mEq by mouth 2 (two) times daily.      . pravastatin (PRAVACHOL) 20 MG tablet Take 20 mg by mouth daily.        Alveda Reasons 20 MG TABS tablet ONE BY MOUTH DAILY WITH EVENING MEAL  90 tablet  1   No current facility-administered medications on file prior to visit.   No Known Allergies History   Social History  . Marital Status: Married    Spouse Name: N/A    Number of Children: N/A  . Years of Education: N/A   Occupational History  . Not on file.   Social History Main Topics  . 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 Smokeless tobacco: Not on file     Comment: quit 30+ yrs ago  . Alcohol Use: No  . Drug Use: No  . Sexual Activity: Not on file   Other Topics Concern  . Not on file   Social History Narrative  . No narrative on file     Review of Systems  All  other systems reviewed and are negative.       Objective:   Physical Exam  Vitals reviewed. Constitutional: He appears well-developed and well-nourished.  Cardiovascular: Normal rate and normal heart sounds.  An irregularly irregular rhythm present.  Pulmonary/Chest: Effort normal. He has rales.  Abdominal: Soft. Bowel sounds are normal. He exhibits no distension. There is no tenderness. There is no rebound and no guarding.  Musculoskeletal: He exhibits no edema.  Skin: Skin is warm. No rash noted.          Assessment & Plan:  1. Hospital discharge follow-up Clinically the patient's gallstone pancreatitis has completely resolved. He is in no pain. He denies any problems bowel movements. He has had no fevers or post hospital complications. I will repeat a CBC and CMP as recommended by the hospitalist to evaluate his potassium and make sure that his liver function tests  have returned to normal. Also repeat a CBC to make sure there is no evidence of leukocytosis. I did caution the patient that he develops unrelenting right quadrant abdominal pain, he used to go to emergency room immediately. - COMPLETE METABOLIC PANEL WITH GFR - CBC with Differential

## 2013-12-07 LAB — COMPLETE METABOLIC PANEL WITH GFR
ALBUMIN: 4.2 g/dL (ref 3.5–5.2)
ALT: 15 U/L (ref 0–53)
AST: 14 U/L (ref 0–37)
Alkaline Phosphatase: 102 U/L (ref 39–117)
BUN: 26 mg/dL — AB (ref 6–23)
CALCIUM: 9.7 mg/dL (ref 8.4–10.5)
CHLORIDE: 105 meq/L (ref 96–112)
CO2: 27 meq/L (ref 19–32)
Creat: 1.22 mg/dL (ref 0.50–1.35)
GFR, EST AFRICAN AMERICAN: 61 mL/min
GFR, EST NON AFRICAN AMERICAN: 53 mL/min — AB
Glucose, Bld: 137 mg/dL — ABNORMAL HIGH (ref 70–99)
POTASSIUM: 4.9 meq/L (ref 3.5–5.3)
Sodium: 139 mEq/L (ref 135–145)
Total Bilirubin: 0.4 mg/dL (ref 0.2–1.2)
Total Protein: 7 g/dL (ref 6.0–8.3)

## 2013-12-07 LAB — CBC WITH DIFFERENTIAL/PLATELET
BASOS ABS: 0.1 10*3/uL (ref 0.0–0.1)
BASOS PCT: 1 % (ref 0–1)
EOS PCT: 5 % (ref 0–5)
Eosinophils Absolute: 0.4 10*3/uL (ref 0.0–0.7)
HCT: 34.2 % — ABNORMAL LOW (ref 39.0–52.0)
Hemoglobin: 11.3 g/dL — ABNORMAL LOW (ref 13.0–17.0)
Lymphocytes Relative: 22 % (ref 12–46)
Lymphs Abs: 1.8 10*3/uL (ref 0.7–4.0)
MCH: 29.8 pg (ref 26.0–34.0)
MCHC: 33 g/dL (ref 30.0–36.0)
MCV: 90.2 fL (ref 78.0–100.0)
Monocytes Absolute: 0.9 10*3/uL (ref 0.1–1.0)
Monocytes Relative: 11 % (ref 3–12)
NEUTROS ABS: 4.9 10*3/uL (ref 1.7–7.7)
Neutrophils Relative %: 61 % (ref 43–77)
PLATELETS: 458 10*3/uL — AB (ref 150–400)
RBC: 3.79 MIL/uL — ABNORMAL LOW (ref 4.22–5.81)
RDW: 16 % — AB (ref 11.5–15.5)
WBC: 8 10*3/uL (ref 4.0–10.5)

## 2013-12-11 ENCOUNTER — Telehealth: Payer: Self-pay | Admitting: Family Medicine

## 2013-12-11 ENCOUNTER — Other Ambulatory Visit: Payer: Self-pay | Admitting: Family Medicine

## 2013-12-11 NOTE — Telephone Encounter (Signed)
Todd [name] is calling in regards to [facility] medication XARELTO 20 MG TABS tablet  stating that the insurance states they are needing authorization for this medication If you could call Todd [name] back at [phone]

## 2013-12-12 NOTE — Telephone Encounter (Signed)
lmtrc

## 2013-12-13 MED ORDER — RIVAROXABAN 20 MG PO TABS: ORAL_TABLET | ORAL | Status: DC

## 2013-12-13 NOTE — Telephone Encounter (Signed)
Insurance covered 31 days of medication but will need prior auth.before any further refill. Informed pt's dtr that she will need to get it filled and the pharmacy will send [place] the PA to fill out and send into insurance. Pt expressed understanding.

## 2013-12-28 ENCOUNTER — Other Ambulatory Visit: Payer: Self-pay | Admitting: Family Medicine

## 2014-01-23 ENCOUNTER — Telehealth: Payer: Self-pay | Admitting: Family Medicine

## 2014-01-23 NOTE — Telephone Encounter (Signed)
PA submitted through [email or web address] / [facility] medicare

## 2014-01-23 NOTE — Telephone Encounter (Signed)
Approved through 01/24/2015 - faxed to pharm

## 2014-01-28 ENCOUNTER — Other Ambulatory Visit: Payer: Self-pay | Admitting: Family Medicine

## 2014-02-25 ENCOUNTER — Other Ambulatory Visit: Payer: Self-pay | Admitting: Family Medicine

## 2014-02-26 ENCOUNTER — Other Ambulatory Visit: Payer: Self-pay | Admitting: Family Medicine

## 2014-03-27 ENCOUNTER — Other Ambulatory Visit: Payer: Self-pay | Admitting: Family Medicine

## 2014-04-24 ENCOUNTER — Other Ambulatory Visit: Payer: Self-pay | Admitting: Family Medicine

## 2014-05-24 ENCOUNTER — Other Ambulatory Visit: Payer: Self-pay | Admitting: Family Medicine

## 2014-06-23 ENCOUNTER — Other Ambulatory Visit: Payer: Self-pay | Admitting: Family Medicine

## 2014-07-12 ENCOUNTER — Encounter: Payer: Self-pay | Admitting: Gastroenterology

## 2014-07-15 ENCOUNTER — Encounter: Payer: Self-pay | Admitting: Gastroenterology

## 2014-07-23 ENCOUNTER — Other Ambulatory Visit: Payer: Self-pay | Admitting: Family Medicine

## 2014-07-23 ENCOUNTER — Encounter: Payer: Self-pay | Admitting: Family Medicine

## 2014-07-23 NOTE — Telephone Encounter (Signed)
Medication refilled per protocol. 

## 2014-08-06 ENCOUNTER — Encounter: Payer: Self-pay | Admitting: Family Medicine

## 2014-08-06 ENCOUNTER — Telehealth: Payer: Self-pay | Admitting: Family Medicine

## 2014-08-06 NOTE — Telephone Encounter (Signed)
Letter sent to pt to call and schedule GREENFOLDER CPE °

## 2014-08-09 ENCOUNTER — Ambulatory Visit: Payer: Medicare Other | Admitting: Family Medicine

## 2014-08-20 ENCOUNTER — Other Ambulatory Visit: Payer: Self-pay | Admitting: Family Medicine

## 2014-09-05 ENCOUNTER — Encounter (HOSPITAL_COMMUNITY): Payer: Self-pay

## 2014-09-05 ENCOUNTER — Emergency Department (HOSPITAL_COMMUNITY): Payer: Medicare Other

## 2014-09-05 ENCOUNTER — Encounter: Payer: Self-pay | Admitting: Family Medicine

## 2014-09-05 ENCOUNTER — Inpatient Hospital Stay (HOSPITAL_COMMUNITY)
Admission: EM | Admit: 2014-09-05 | Discharge: 2014-09-10 | DRG: 439 | Disposition: A | Discharge status: Home Health | Payer: Medicare Other | Attending: Internal Medicine | Admitting: Internal Medicine

## 2014-09-05 ENCOUNTER — Ambulatory Visit (INDEPENDENT_AMBULATORY_CARE_PROVIDER_SITE_OTHER): Payer: Medicare Other | Admitting: Family Medicine

## 2014-09-05 VITALS — BP 142/60 | HR 76 | Temp 97.6°F | Resp 20 | Ht 67.0 in | Wt 163.0 lb

## 2014-09-05 DIAGNOSIS — Z823 Family history of stroke: Secondary | ICD-10-CM

## 2014-09-05 DIAGNOSIS — Z0000 Encounter for general adult medical examination without abnormal findings: Secondary | ICD-10-CM

## 2014-09-05 DIAGNOSIS — E785 Hyperlipidemia, unspecified: Secondary | ICD-10-CM | POA: Diagnosis present

## 2014-09-05 DIAGNOSIS — I5032 Chronic diastolic (congestive) heart failure: Secondary | ICD-10-CM | POA: Diagnosis present

## 2014-09-05 DIAGNOSIS — R112 Nausea with vomiting, unspecified: Secondary | ICD-10-CM | POA: Diagnosis not present

## 2014-09-05 DIAGNOSIS — I10 Essential (primary) hypertension: Secondary | ICD-10-CM | POA: Diagnosis present

## 2014-09-05 DIAGNOSIS — I482 Chronic atrial fibrillation, unspecified: Secondary | ICD-10-CM | POA: Diagnosis present

## 2014-09-05 DIAGNOSIS — E119 Type 2 diabetes mellitus without complications: Secondary | ICD-10-CM | POA: Diagnosis present

## 2014-09-05 DIAGNOSIS — K830 Cholangitis: Secondary | ICD-10-CM | POA: Diagnosis present

## 2014-09-05 DIAGNOSIS — J45909 Unspecified asthma, uncomplicated: Secondary | ICD-10-CM | POA: Diagnosis present

## 2014-09-05 DIAGNOSIS — K851 Biliary acute pancreatitis without necrosis or infection: Secondary | ICD-10-CM | POA: Diagnosis present

## 2014-09-05 DIAGNOSIS — Z8673 Personal history of transient ischemic attack (TIA), and cerebral infarction without residual deficits: Secondary | ICD-10-CM | POA: Diagnosis not present

## 2014-09-05 DIAGNOSIS — Z87891 Personal history of nicotine dependence: Secondary | ICD-10-CM

## 2014-09-05 DIAGNOSIS — Z8701 Personal history of pneumonia (recurrent): Secondary | ICD-10-CM

## 2014-09-05 DIAGNOSIS — Z66 Do not resuscitate: Secondary | ICD-10-CM | POA: Diagnosis present

## 2014-09-05 DIAGNOSIS — Z825 Family history of asthma and other chronic lower respiratory diseases: Secondary | ICD-10-CM

## 2014-09-05 DIAGNOSIS — I351 Nonrheumatic aortic (valve) insufficiency: Secondary | ICD-10-CM | POA: Diagnosis present

## 2014-09-05 DIAGNOSIS — K810 Acute cholecystitis: Secondary | ICD-10-CM | POA: Diagnosis present

## 2014-09-05 DIAGNOSIS — I5030 Unspecified diastolic (congestive) heart failure: Secondary | ICD-10-CM

## 2014-09-05 DIAGNOSIS — E039 Hypothyroidism, unspecified: Secondary | ICD-10-CM | POA: Diagnosis present

## 2014-09-05 DIAGNOSIS — I350 Nonrheumatic aortic (valve) stenosis: Secondary | ICD-10-CM | POA: Diagnosis present

## 2014-09-05 DIAGNOSIS — K8012 Calculus of gallbladder with acute and chronic cholecystitis without obstruction: Secondary | ICD-10-CM | POA: Diagnosis present

## 2014-09-05 DIAGNOSIS — R7989 Other specified abnormal findings of blood chemistry: Secondary | ICD-10-CM | POA: Diagnosis present

## 2014-09-05 DIAGNOSIS — Z79899 Other long term (current) drug therapy: Secondary | ICD-10-CM | POA: Diagnosis not present

## 2014-09-05 DIAGNOSIS — R509 Fever, unspecified: Secondary | ICD-10-CM

## 2014-09-05 DIAGNOSIS — R109 Unspecified abdominal pain: Secondary | ICD-10-CM

## 2014-09-05 DIAGNOSIS — I110 Hypertensive heart disease with heart failure: Secondary | ICD-10-CM | POA: Diagnosis present

## 2014-09-05 DIAGNOSIS — I252 Old myocardial infarction: Secondary | ICD-10-CM | POA: Diagnosis not present

## 2014-09-05 DIAGNOSIS — K861 Other chronic pancreatitis: Secondary | ICD-10-CM | POA: Diagnosis present

## 2014-09-05 DIAGNOSIS — R945 Abnormal results of liver function studies: Secondary | ICD-10-CM

## 2014-09-05 DIAGNOSIS — R1013 Epigastric pain: Secondary | ICD-10-CM

## 2014-09-05 DIAGNOSIS — D72829 Elevated white blood cell count, unspecified: Secondary | ICD-10-CM | POA: Diagnosis present

## 2014-09-05 DIAGNOSIS — K859 Acute pancreatitis without necrosis or infection, unspecified: Secondary | ICD-10-CM | POA: Diagnosis present

## 2014-09-05 DIAGNOSIS — I481 Persistent atrial fibrillation: Secondary | ICD-10-CM

## 2014-09-05 DIAGNOSIS — Z23 Encounter for immunization: Secondary | ICD-10-CM

## 2014-09-05 DIAGNOSIS — E038 Other specified hypothyroidism: Secondary | ICD-10-CM

## 2014-09-05 DIAGNOSIS — Z9049 Acquired absence of other specified parts of digestive tract: Secondary | ICD-10-CM | POA: Diagnosis present

## 2014-09-05 DIAGNOSIS — Z7901 Long term (current) use of anticoagulants: Secondary | ICD-10-CM

## 2014-09-05 DIAGNOSIS — Z8249 Family history of ischemic heart disease and other diseases of the circulatory system: Secondary | ICD-10-CM | POA: Diagnosis not present

## 2014-09-05 DIAGNOSIS — J4520 Mild intermittent asthma, uncomplicated: Secondary | ICD-10-CM

## 2014-09-05 DIAGNOSIS — Z01810 Encounter for preprocedural cardiovascular examination: Secondary | ICD-10-CM | POA: Diagnosis present

## 2014-09-05 LAB — URINALYSIS, ROUTINE W REFLEX MICROSCOPIC
Bilirubin Urine: NEGATIVE
Glucose, UA: 100 mg/dL — AB
Ketones, ur: NEGATIVE mg/dL
Nitrite: NEGATIVE
PROTEIN: NEGATIVE mg/dL
Specific Gravity, Urine: 1.014 (ref 1.005–1.030)
UROBILINOGEN UA: 1 mg/dL (ref 0.0–1.0)
pH: 6 (ref 5.0–8.0)

## 2014-09-05 LAB — I-STAT TROPONIN, ED: Troponin i, poc: 0 ng/mL (ref 0.00–0.08)

## 2014-09-05 LAB — CBC WITH DIFFERENTIAL/PLATELET
BASOS ABS: 0 10*3/uL (ref 0.0–0.1)
BASOS PCT: 0 % (ref 0–1)
Eosinophils Absolute: 0 10*3/uL (ref 0.0–0.7)
Eosinophils Relative: 0 % (ref 0–5)
HCT: 35.5 % — ABNORMAL LOW (ref 39.0–52.0)
Hemoglobin: 12 g/dL — ABNORMAL LOW (ref 13.0–17.0)
Lymphocytes Relative: 2 % — ABNORMAL LOW (ref 12–46)
Lymphs Abs: 0.3 10*3/uL — ABNORMAL LOW (ref 0.7–4.0)
MCH: 31.2 pg (ref 26.0–34.0)
MCHC: 33.8 g/dL (ref 30.0–36.0)
MCV: 92.2 fL (ref 78.0–100.0)
MONO ABS: 1.1 10*3/uL — AB (ref 0.1–1.0)
Monocytes Relative: 9 % (ref 3–12)
NEUTROS ABS: 10.7 10*3/uL — AB (ref 1.7–7.7)
NEUTROS PCT: 89 % — AB (ref 43–77)
Platelets: 255 10*3/uL (ref 150–400)
RBC: 3.85 MIL/uL — ABNORMAL LOW (ref 4.22–5.81)
RDW: 14 % (ref 11.5–15.5)
WBC: 12.1 10*3/uL — AB (ref 4.0–10.5)

## 2014-09-05 LAB — COMPREHENSIVE METABOLIC PANEL
ALBUMIN: 3.8 g/dL (ref 3.5–5.2)
ALT: 176 U/L — AB (ref 0–53)
AST: 272 U/L — ABNORMAL HIGH (ref 0–37)
Alkaline Phosphatase: 156 U/L — ABNORMAL HIGH (ref 39–117)
Anion gap: 16 — ABNORMAL HIGH (ref 5–15)
BILIRUBIN TOTAL: 1.3 mg/dL — AB (ref 0.3–1.2)
BUN: 28 mg/dL — ABNORMAL HIGH (ref 6–23)
CHLORIDE: 101 meq/L (ref 96–112)
CO2: 22 mEq/L (ref 19–32)
CREATININE: 1.16 mg/dL (ref 0.50–1.35)
Calcium: 9.6 mg/dL (ref 8.4–10.5)
GFR calc Af Amer: 63 mL/min — ABNORMAL LOW (ref >90)
GFR calc non Af Amer: 54 mL/min — ABNORMAL LOW (ref >90)
Glucose, Bld: 162 mg/dL — ABNORMAL HIGH (ref 70–99)
POTASSIUM: 4.2 meq/L (ref 3.7–5.3)
SODIUM: 139 meq/L (ref 137–147)
Total Protein: 7.3 g/dL (ref 6.0–8.3)

## 2014-09-05 LAB — I-STAT CG4 LACTIC ACID, ED: LACTIC ACID, VENOUS: 2.08 mmol/L (ref 0.5–2.2)

## 2014-09-05 LAB — LIPASE, BLOOD

## 2014-09-05 LAB — URINE MICROSCOPIC-ADD ON

## 2014-09-05 MED ORDER — SODIUM CHLORIDE 0.9 % IV SOLN: 1000.0000 mL | Freq: Once | INTRAVENOUS | Status: DC

## 2014-09-05 MED ORDER — ACETAMINOPHEN 325 MG PO TABS
650.0000 mg | ORAL_TABLET | Freq: Once | ORAL | Status: AC
  Administered 2014-09-05: 650 mg via ORAL
  Filled 2014-09-05: qty 2

## 2014-09-05 MED ORDER — FENTANYL CITRATE 0.05 MG/ML IJ SOLN
50.0000 ug | Freq: Once | INTRAMUSCULAR | Status: AC
  Administered 2014-09-05: 50 ug via INTRAVENOUS
  Filled 2014-09-05: qty 2

## 2014-09-05 MED ORDER — ONDANSETRON HCL 4 MG/2ML IJ SOLN
4.0000 mg | Freq: Three times a day (TID) | INTRAMUSCULAR | Status: DC | PRN
  Administered 2014-09-05 – 2014-09-07 (×2): 4 mg via INTRAVENOUS
  Filled 2014-09-05 (×2): qty 2

## 2014-09-05 MED ORDER — IOHEXOL 300 MG/ML  SOLN
100.0000 mL | Freq: Once | INTRAMUSCULAR | Status: AC | PRN
  Administered 2014-09-05: 100 mL via INTRAVENOUS

## 2014-09-05 MED ORDER — FENTANYL CITRATE 0.05 MG/ML IJ SOLN
50.0000 ug | Freq: Once | INTRAMUSCULAR | Status: AC
  Administered 2014-09-05: 50 ug via NASAL
  Filled 2014-09-05: qty 2

## 2014-09-05 MED ORDER — HYDROMORPHONE HCL 1 MG/ML IJ SOLN
1.0000 mg | INTRAMUSCULAR | Status: DC | PRN
  Administered 2014-09-05: 1 mg via INTRAVENOUS
  Filled 2014-09-05: qty 1

## 2014-09-05 MED ORDER — SODIUM CHLORIDE 0.9 % IV BOLUS (SEPSIS)
1000.0000 mL | Freq: Once | INTRAVENOUS | Status: AC
  Administered 2014-09-05: 1000 mL via INTRAVENOUS

## 2014-09-05 MED ORDER — ONDANSETRON HCL 4 MG/2ML IJ SOLN
4.0000 mg | Freq: Once | INTRAMUSCULAR | Status: AC
  Administered 2014-09-05: 4 mg via INTRAVENOUS
  Filled 2014-09-05: qty 2

## 2014-09-05 MED ORDER — ONDANSETRON HCL 4 MG/2ML IJ SOLN: 4.0000 mg | Freq: Once | INTRAMUSCULAR | Status: DC

## 2014-09-05 MED ORDER — PIPERACILLIN-TAZOBACTAM 3.375 G IVPB
3.3750 g | Freq: Once | INTRAVENOUS | Status: AC
  Administered 2014-09-05: 3.375 g via INTRAVENOUS
  Filled 2014-09-05: qty 50

## 2014-09-05 NOTE — Progress Notes (Signed)
Subjective:    Patient ID: Todd [name], male    DOB: 04-14-1924, 80 y.o.   MRN: [id number]  HPI Patient is a very pleasant 80 year old white male who is here today for complete physical exam. He is due for his annual flu shot as well as Prevnar 13. Pneumovax 23 is up-to-date. Patient's colonoscopy was last done in 2010. Given his age he does not require this in the future. We had a long discussion today about prostate cancer screening. Both I and the patient agree that there is no benefit in him pursuing prostate cancer screening and he defers that. He is overdue for lab work. Past Medical History  Diagnosis Date  . Asthma   . Hypertension   . Diabetes mellitus   . Cancer   . A-fib   . Hyperlipidemia   . Colon cancer   . Shortness of breath   . CHF (congestive heart failure) 10/28/2011  . Aortic stenosis     Mild  . Forgetfulness   . PVC's (premature ventricular contractions)   . Mild aortic insufficiency    Past Surgical History  Procedure Laterality Date  . Colon surgery      Partial hemecolectomy   . Central line insertion  09/08/2011        Current Outpatient Prescriptions on File Prior to Visit  Medication Sig Dispense Refill  . ADVAIR DISKUS 500-50 MCG/DOSE AEPB INHALE 1 PUFF TWICE A DAY 180 each 2  . amLODipine (NORVASC) 5 MG tablet TAKE 1 TABLET BY MOUTH DAILY 90 tablet 0  . ferrous gluconate (FERGON) 325 MG tablet Take 325 mg by mouth at bedtime.    . ferrous sulfate 325 (65 FE) MG tablet TAKE 1 TABLET EVERY DAY 30 tablet 11  . fluticasone (FLONASE) 50 MCG/ACT nasal spray USE 2 SPRAYS IN EACH NOSTRIL ONCE A DAY 48 g 11  . furosemide (LASIX) 20 MG tablet TAKE 1 TABLET EVERY DAY 90 tablet 1  . KLOR-CON M20 20 MEQ tablet TAKE 1/2 TABLET (10 MEQ TOTAL) BY MOUTH 2 (TWO) TIMES DAILY. 90 tablet 3  . levothyroxine (SYNTHROID, LEVOTHROID) 25 MCG tablet TAKE 1 TABLET BY MOUTH EVERY DAY 90 tablet 3  . losartan (COZAAR) 50 MG tablet TAKE 1 TABLET EVERY DAY 30 tablet 11  .  omeprazole (PRILOSEC) 40 MG capsule TAKE ONE CAPSULE BY MOUTH DAILY 30 capsule 12  . potassium chloride SA (K-DUR,KLOR-CON) 20 MEQ tablet Take 20 mEq by mouth 2 (two) times daily.    . pravastatin (PRAVACHOL) 20 MG tablet TAKE 1 TABLET EVERY DAY 90 tablet 4  . Rivaroxaban (XARELTO) 20 MG TABS tablet ONE BY MOUTH DAILY WITH EVENING MEAL 31 tablet 11   No current facility-administered medications on file prior to visit.   No Known Allergies History   Social History  . Marital Status: Married    Spouse Name: N/A    Number of Children: N/A  . Years of Education: N/A   Occupational History  . Not on file.   Social History Main Topics  . 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 Smokeless tobacco: Not on file     Comment: quit 30+ yrs ago  . Alcohol Use: No  . Drug Use: No  . Sexual Activity: Not on file   Other Topics Concern  . Not on file   Social History Narrative   Family History  Problem Relation Age of Onset  . Hyperlipidemia Father   . Hypertension Father   . Heart disease Father   .  Asthma Father   . Stroke Father   . Heart failure Father   . Heart disease Mother   . Heart failure Mother   . Heart attack Sister       Review of Systems  All other systems reviewed and are negative.      Objective:   Physical Exam  Constitutional: He is oriented to person, place, and time. He appears well-developed and well-nourished. No distress.  HENT:  Head: Normocephalic and atraumatic.  Right Ear: External ear normal.  Left Ear: External ear normal.  Nose: Nose normal.  Mouth/Throat: Oropharynx is clear and moist. No oropharyngeal exudate.  Eyes: Conjunctivae and EOM are normal. Pupils are equal, round, and reactive to light. Right eye exhibits no discharge. Left eye exhibits no discharge. No scleral icterus.  Neck: Normal range of motion. Neck supple. No JVD present. No tracheal deviation present. No thyromegaly present.  Cardiovascular: Normal rate and regular rhythm.     Murmur heard. Pulmonary/Chest: Effort normal. No stridor. No respiratory distress. He has no wheezes. He has rales. He exhibits no tenderness.  Abdominal: Soft. Bowel sounds are normal. He exhibits no distension and no mass. There is no tenderness. There is no rebound and no guarding.  Musculoskeletal: Normal range of motion. He exhibits no edema or tenderness.  Lymphadenopathy:    He has no cervical adenopathy.  Neurological: He is alert and oriented to person, place, and time. He has normal reflexes. He displays normal reflexes. No cranial nerve deficit. He exhibits normal muscle tone. Coordination normal.  Skin: Skin is warm. No rash noted. He is not diaphoretic. No erythema. No pallor.  Psychiatric: He has a normal mood and affect. His behavior is normal. Judgment and thought content normal.  Vitals reviewed. Patient is hard of hearing. There is a erythematous scaly hard papule on his right ear and on his left posterior shoulder. Both appeared cancerous. I recommended following up with his dermatologist for excision. Patient has chronic by basilar crackles.        Assessment & Plan:  Routine general medical examination at a health care facility  Patient's physical exam shows no acute findings. I did recommend that he follow-up with his dermatologist for evaluation of 2 precancerous to cancerous lesions on his ear and left posterior shoulder. I recommended the patient receive his flu shot and Prevnar 13 today. Otherwise his cancer screening is up-to-date and his preventative care. I will check lab work including a CBC, CMP, TSH. I would like the patient return fasting for fasting lipid panel.

## 2014-09-05 NOTE — H&P (Addendum)
[facility] History and Physical  ADETOKUNBO MCCADDEN TDV:[id number] DOB: 12-13-1923 DOA: 09/05/2014  Referring physician: ED physician PCP: Odette Fraction, MD  Specialists:   Chief Complaint: Nausea, vomiting, abdominal pain.  HPI: Todd [name] is a 80 y.o. male with past medical history of diabetes type 2, atrial fibrillation on Xarelto, asthma, congestive heart failure, hypertension, hyperlipidemia, hypothyroidism, who presented with nausea, vomiting and abdominal pain.  Patient's abdominal pain started in the morning, is associated with fever, chills, nausea, vomiting, but no diarrhea. Patient does not have cough, chest pain, shortness of breath, symptoms for UTI, hematuria, hematochezia. Of note the patient had gallstone pancreatitis 10 months ago. He refused cholecystectomy in that admission.   Work up in the ED demonstrates acute pancreatitis with lipase >3000. CT-abd pending. Elevated ALT, AST, ALT and total bilirubin. Leukocytosis with WBC 12.1. Patient is admitted to inpatient for further evaluate and treatment.  Review of Systems: As presented in the history of presenting illness, rest negative.  Where does patient live? Lives at home  Can patient participate in ADLs? little  Allergy: No Known Allergies  Past Medical History  Diagnosis Date  . Asthma   . Hypertension   . Diabetes mellitus   . Cancer   . A-fib   . Hyperlipidemia   . Colon cancer   . Shortness of breath   . CHF (congestive heart failure) 10/28/2011  . Aortic stenosis     Mild  . Forgetfulness   . PVC's (premature ventricular contractions)   . Mild aortic insufficiency     Past Surgical History  Procedure Laterality Date  . Colon surgery      Partial hemecolectomy   . Central line insertion  09/08/2011         Social History:  reports that he has quit smoking. He does not have any smokeless tobacco history on file. He reports that he does not drink alcohol or use illicit  drugs.  Family History:  Family History  Problem Relation Age of Onset  . Hyperlipidemia Father   . Hypertension Father   . Heart disease Father   . Asthma Father   . Stroke Father   . Heart failure Father   . Heart disease Mother   . Heart failure Mother   . Heart attack Sister      Prior to Admission medications   Medication Sig Start Date End Date Taking? Authorizing Provider  amLODipine (NORVASC) 5 MG tablet Take 5 mg by mouth daily.   Yes Historical Provider, MD  ferrous sulfate 325 (65 FE) MG tablet Take 325 mg by mouth at bedtime.   Yes Historical Provider, MD  fluticasone (FLONASE) 50 MCG/ACT nasal spray Place 2 sprays into both nostrils daily.   Yes Historical Provider, MD  Fluticasone-Salmeterol (ADVAIR) 500-50 MCG/DOSE AEPB Inhale 1 puff into the lungs 2 (two) times daily.   Yes Historical Provider, MD  furosemide (LASIX) 20 MG tablet Take 20 mg by mouth daily.   Yes Historical Provider, MD  levothyroxine (SYNTHROID, LEVOTHROID) 25 MCG tablet Take 25 mcg by mouth daily.   Yes Historical Provider, MD  losartan (COZAAR) 25 MG tablet Take 25 mg by mouth daily.   Yes Historical Provider, MD  omeprazole (PRILOSEC) 40 MG capsule Take 40 mg by mouth daily.   Yes Historical Provider, MD  pravastatin (PRAVACHOL) 20 MG tablet Take 20 mg by mouth daily.   Yes Historical Provider, MD  rivaroxaban (XARELTO) 20 MG TABS tablet Take 20 mg by mouth daily  with supper.   Yes Historical Provider, MD  ADVAIR DISKUS 500-50 MCG/DOSE AEPB INHALE 1 PUFF TWICE A DAY 12/11/13   Susy Frizzle, MD  amLODipine (NORVASC) 5 MG tablet TAKE 1 TABLET BY MOUTH DAILY 07/23/14   Susy Frizzle, MD  ferrous gluconate (FERGON) 325 MG tablet Take 325 mg by mouth at bedtime.    Historical Provider, MD  ferrous sulfate 325 (65 FE) MG tablet TAKE 1 TABLET EVERY DAY 12/02/13   Susy Frizzle, MD  fluticasone [facility]) 50 MCG/ACT nasal spray USE 2 SPRAYS IN EACH NOSTRIL ONCE A DAY 05/24/14   Susy Frizzle, MD   furosemide (LASIX) 20 MG tablet TAKE 1 TABLET EVERY DAY 08/20/14   Susy Frizzle, MD  KLOR-CON M20 20 MEQ tablet TAKE 1/2 TABLET (10 MEQ TOTAL) BY MOUTH 2 (TWO) TIMES DAILY. 12/28/13   Susy Frizzle, MD  levothyroxine (SYNTHROID, LEVOTHROID) 25 MCG tablet TAKE 1 TABLET BY MOUTH EVERY DAY 03/27/14   Susy Frizzle, MD  losartan (COZAAR) 50 MG tablet TAKE 1 TABLET EVERY DAY 04/24/14   Susy Frizzle, MD  omeprazole (PRILOSEC) 40 MG capsule TAKE ONE CAPSULE BY MOUTH DAILY 06/24/14   Susy Frizzle, MD  potassium chloride SA (K-DUR,KLOR-CON) 20 MEQ tablet Take 20 mEq by mouth 2 (two) times daily.    Historical Provider, MD  potassium chloride SA (K-DUR,KLOR-CON) 20 MEQ tablet Take 10 mEq by mouth 2 (two) times daily.    Historical Provider, MD  pravastatin (PRAVACHOL) 20 MG tablet TAKE 1 TABLET EVERY DAY 02/25/14   Susy Frizzle, MD  Rivaroxaban Alveda Reasons) 20 MG TABS tablet ONE BY MOUTH DAILY WITH EVENING MEAL 12/13/13   Susy Frizzle, MD    Physical Exam: Filed Vitals:   09/05/14 2113 09/05/14 2235  BP: 170/62 149/61  Pulse: 77 70  Temp: 101.5 F (38.6 C) 98.5 F (36.9 C)  TempSrc: Oral Oral  Resp: 26 22  SpO2: 100% 94%   General:  In acute distress due to pain HEENT:       Eyes: PERRL, EOMI, no scleral icterus       ENT: No discharge from the ears and nose, no pharynx injection, no tonsillar enlargement.        Neck: No JVD, no bruit, no mass felt. Cardiac: S1/S2, RRR, No murmurs, gallops or rubs Pulm: Good air movement bilaterally. Clear to auscultation bilaterally. No rales, wheezing, rhonchi or rubs. Abd: Epigastric, right upper quadrant, left upper quadrant tenderness. Bowel sounds are normal. He exhibits no distension. There is no rebound  Ext: No edema bilaterally. 2+DP/PT pulse bilaterally Musculoskeletal: No joint deformities, erythema, or stiffness, ROM full Skin: No rashes.  Neuro: Alert and oriented X3, cranial nerves II-XII grossly intact, muscle strength 5/5  in all extremeties, sensation to light touch intact. Moves all extremities. Psych: Patient is not psychotic, no suicidal or hemocidal ideation.  Labs on Admission:  Basic Metabolic Panel:  Recent Labs Lab 09/05/14 2134  NA 139  K 4.2  CL 101  CO2 22  GLUCOSE 162*  BUN 28*  CREATININE 1.16  CALCIUM 9.6   Liver Function Tests:  Recent Labs Lab 09/05/14 2134  AST 272*  ALT 176*  ALKPHOS 156*  BILITOT 1.3*  PROT 7.3  ALBUMIN 3.8    Recent Labs Lab 09/05/14 2134  LIPASE >3000*   No results for input(s): AMMONIA in the last 168 hours. CBC:  Recent Labs Lab 09/05/14 2134  WBC 12.1*  NEUTROABS 10.7*  HGB 12.0*  HCT 35.5*  MCV 92.2  PLT 255   Cardiac Enzymes: No results for input(s): CKTOTAL, CKMB, CKMBINDEX, TROPONINI in the last 168 hours.  BNP (last 3 results) No results for input(s): PROBNP in the last 8760 hours. CBG: No results for input(s): GLUCAP in the last 168 hours.  Radiological Exams on Admission: Dg Chest Port 1 View  09/05/2014   CLINICAL DATA:  Fever beginning today. Personal history of colon cancer, asthma, hypertension, atrial fibrillation and congestive heart failure.  EXAM: PORTABLE CHEST - 1 VIEW  COMPARISON:  11/23/2013 and previous  FINDINGS: The heart is at the upper limits of normal in size. There is calcification of the aorta. There is mild chronic scarring in the right mid lung and at both lung bases but no sign of active infiltrate, effusion or collapse.  IMPRESSION: Areas of chronic pulmonary scarring.  No active process evident.   Electronically Signed   By: Nelson Chimes M.D.   On: 09/05/2014 22:41    EKG: Independently reviewed.   Assessment/Plan Principal Problem:   Gallstone pancreatitis Active Problems:   HLD (hyperlipidemia)   Essential hypertension   Asthma   Atrial fibrillation   CHF (congestive heart failure)   Hypothyroidism   Acute gallstone pancreatitis: patient's presentations plus lab tests findings are  consistent with gallstone pancreatitis. Currently patient is hemodynamically stable, but at risk of developing sepsis. - will admit to tele bed give A fib - start zosyn IV - blood culture x 2 - symptomatic management: Zofran for nausea and Dilaudid for pain - pending CT scan -abd - gentle IVF: NS 75 cc/h and hold lasix - GI was consulted by ED, with follow-up recommendations  Diastolic congestive heart failure: 2-D echo on 11/09/11 showed EF 65%. Patient is euvolemic on admission. No any leg edema. - hold lasix in the setting of possible biliary system infection and pt is at risk of developing sepsis, needs IV fluid - check Pro BNP in AM  Diabetes mellitus: diet controled at home. Last A1c was 6.1 on 06/19/11 - SSI - check A1c  Hypertension: controlled at present - will restart patient's home medications    Chronic Afib: Rate controlled - on chronic Xarelto   Hyperlipidemia: LDL was 93 on 06/13/11 - continue protocol - Check lipid profile  Hx colon CA s/p hemicolectomy, stable    Asthma: stable. No signs of acute exacerbation. - continue breathing treatment inhalers.   DVT ppx: on Xarelto  Code Status: DNR Family Communication:  Yes, patient's daughter at bed side Disposition Plan: Admit to inpatient   Date of Service 09/05/2014    Ivor Costa [facility] Pager [phone]  If 7PM-7AM, please contact night-coverage [email or web address] Password [email or web address] 09/05/2014, 11:31 PM

## 2014-09-05 NOTE — ED Notes (Signed)
Patient arrives by EMS with complaints of abdominal pain-upper left and right abdominal pain associated with nausea and vomiting x2 earlier today and tried to eat this evening and vomited again-has been seen at [facility] for gallbladder "problems"-surgeon was just monitoring patient.  PIV left ACF and received Zofran 8 mg IV by EMS and 250 cc fluid bolus.

## 2014-09-05 NOTE — ED Notes (Signed)
Bed: GK81 Expected date:  Expected time:  Means of arrival:  Comments: EMS 80 yo male with abdominal pain/nausea and vomiting/IV Zofran

## 2014-09-05 NOTE — ED Provider Notes (Signed)
CSN: [id number]     Arrival date & time 09/05/14  2107 History   First MD Initiated Contact with Patient 09/05/14 2116     Chief Complaint  Patient presents with  . Abdominal Pain  . Emesis     (Consider location/radiation/quality/duration/timing/severity/associated sxs/prior Treatment) HPI Todd [name] is a 80 y.o. malewho presents to emergency department complaining of nausea, vomiting, abdominal pain. Patient states his pain began this morning, states had an episode of vomiting after lunch. Pain gradually worsened this evening. Patient actually had an appointment with his primary care doctor today, for routine physical, and received a flu and pneumonia vaccines. Patient states after he got home his pain worsened, and he had multiple episodes of emesis. Pain is epigastric. Radiates to the back. Admits to fever and chills. History of similar pain 10 months ago, states was diagnosed with gallstone pancreatitis. Patient was hospitalized, was discharged without cholecystectomy. He has not had any problems since. Patient denies any other complaints, no chest pain, no shortness of breath, no diarrhea, no back pain, no urinary symptoms. Medications taken at home prior to coming in. Nothing makes symptoms better or worse.    Past Medical History  Diagnosis Date  . Asthma   . Hypertension   . Diabetes mellitus   . Cancer   . A-fib   . Hyperlipidemia   . Colon cancer   . Shortness of breath   . CHF (congestive heart failure) 10/28/2011  . Aortic stenosis     Mild  . Forgetfulness   . PVC's (premature ventricular contractions)   . Mild aortic insufficiency    Past Surgical History  Procedure Laterality Date  . Colon surgery      Partial hemecolectomy   . Central line insertion  09/08/2011        Family History  Problem Relation Age of Onset  . Hyperlipidemia Father   . Hypertension Father   . Heart disease Father   . Asthma Father   . Stroke Father   . Heart failure Father    . Heart disease Mother   . Heart failure Mother   . Heart attack Sister    History  Substance Use Topics  . 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 Smokeless tobacco: Not on file     Comment: quit 30+ yrs ago  . Alcohol Use: No    Review of Systems  Constitutional: Positive for fever and chills.  Respiratory: Negative for cough, chest tightness and shortness of breath.   Cardiovascular: Negative for chest pain, palpitations and leg swelling.  Gastrointestinal: Positive for nausea, vomiting and abdominal pain. Negative for diarrhea, constipation, blood in stool, abdominal distention and rectal pain.  Genitourinary: Negative for dysuria, urgency, frequency, hematuria and flank pain.  Musculoskeletal: Negative for myalgias, arthralgias, neck pain and neck stiffness.  Skin: Negative for rash.  Neurological: Negative for dizziness, weakness, light-headedness, numbness and headaches.  All other systems reviewed and are negative.     Allergies  Review of patient's allergies indicates no known allergies.  Home Medications   Prior to Admission medications   Medication Sig Start Date End Date Taking? Authorizing Provider  ADVAIR DISKUS 500-50 MCG/DOSE AEPB INHALE 1 PUFF TWICE A DAY 12/11/13   Susy Frizzle, MD  amLODipine (NORVASC) 5 MG tablet TAKE 1 TABLET BY MOUTH DAILY 07/23/14   Susy Frizzle, MD  ferrous gluconate (FERGON) 325 MG tablet Take 325 mg by mouth at bedtime.    Historical Provider, MD  ferrous sulfate  325 (65 FE) MG tablet TAKE 1 TABLET EVERY DAY 12/02/13   Susy Frizzle, MD  fluticasone [facility]) 50 MCG/ACT nasal spray USE 2 SPRAYS IN EACH NOSTRIL ONCE A DAY 05/24/14   Susy Frizzle, MD  furosemide (LASIX) 20 MG tablet TAKE 1 TABLET EVERY DAY 08/20/14   Susy Frizzle, MD  KLOR-CON M20 20 MEQ tablet TAKE 1/2 TABLET (10 MEQ TOTAL) BY MOUTH 2 (TWO) TIMES DAILY. 12/28/13   Susy Frizzle, MD  levothyroxine (SYNTHROID, LEVOTHROID) 25 MCG tablet TAKE 1 TABLET BY MOUTH  EVERY DAY 03/27/14   Susy Frizzle, MD  losartan (COZAAR) 50 MG tablet TAKE 1 TABLET EVERY DAY 04/24/14   Susy Frizzle, MD  omeprazole (PRILOSEC) 40 MG capsule TAKE ONE CAPSULE BY MOUTH DAILY 06/24/14   Susy Frizzle, MD  potassium chloride SA (K-DUR,KLOR-CON) 20 MEQ tablet Take 20 mEq by mouth 2 (two) times daily.    Historical Provider, MD  pravastatin (PRAVACHOL) 20 MG tablet TAKE 1 TABLET EVERY DAY 02/25/14   Susy Frizzle, MD  Rivaroxaban (XARELTO) 20 MG TABS tablet ONE BY MOUTH DAILY WITH EVENING MEAL 12/13/13   Susy Frizzle, MD   BP 170/62 mmHg  Pulse 77  Temp(Src) 101.5 F (38.6 C) (Oral)  Resp 26  SpO2 100% Physical Exam  Constitutional: He is oriented to person, place, and time. He appears well-developed and well-nourished. No distress.  HENT:  Head: Normocephalic and atraumatic.  Eyes: Conjunctivae are normal.  Neck: Neck supple.  Cardiovascular: Normal rate and regular rhythm.   Murmur heard. Pulmonary/Chest: Effort normal. No respiratory distress. He has no wheezes. He has no rales.  Abdominal: Soft. Bowel sounds are normal. He exhibits no distension. There is tenderness. There is no rebound and no guarding.  Epigastric, right upper quadrant, left upper quadrant tenderness  Musculoskeletal: He exhibits no edema.  Neurological: He is alert and oriented to person, place, and time.  Skin: Skin is warm and dry.  Nursing note and vitals reviewed.   ED Course  Procedures (including critical care time) Labs Review Labs Reviewed  CBC WITH DIFFERENTIAL - Abnormal; Notable for the following:    WBC 12.1 (*)    RBC 3.85 (*)    Hemoglobin 12.0 (*)    HCT 35.5 (*)    Neutrophils Relative % 89 (*)    Neutro Abs 10.7 (*)    Lymphocytes Relative 2 (*)    Lymphs Abs 0.3 (*)    Monocytes Absolute 1.1 (*)    All other components within normal limits  COMPREHENSIVE METABOLIC PANEL - Abnormal; Notable for the following:    Glucose, Bld 162 (*)    BUN 28 (*)    AST  272 (*)    ALT 176 (*)    Alkaline Phosphatase 156 (*)    Total Bilirubin 1.3 (*)    GFR calc non Af Amer 54 (*)    GFR calc Af Amer 63 (*)    Anion gap 16 (*)    All other components within normal limits  LIPASE, BLOOD - Abnormal; Notable for the following:    Lipase >3000 (*)    All other components within normal limits  URINALYSIS, ROUTINE W REFLEX MICROSCOPIC - Abnormal; Notable for the following:    APPearance TURBID (*)    Glucose, UA 100 (*)    Hgb urine dipstick TRACE (*)    Leukocytes, UA TRACE (*)    All other components within normal limits  CULTURE, BLOOD (  ROUTINE X 2)  CULTURE, BLOOD (ROUTINE X 2)  URINE MICROSCOPIC-ADD ON  Randolm Idol, ED  I-STAT CG4 LACTIC ACID, ED    Imaging Review Dg Chest Port 1 View  09/05/2014   CLINICAL DATA:  Fever beginning today. Personal history of colon cancer, asthma, hypertension, atrial fibrillation and congestive heart failure.  EXAM: PORTABLE CHEST - 1 VIEW  COMPARISON:  11/23/2013 and previous  FINDINGS: The heart is at the upper limits of normal in size. There is calcification of the aorta. There is mild chronic scarring in the right mid lung and at both lung bases but no sign of active infiltrate, effusion or collapse.  IMPRESSION: Areas of chronic pulmonary scarring.  No active process evident.   Electronically Signed   By: Nelson Chimes M.D.   On: 09/05/2014 22:41     EKG Interpretation   Date/Time:  Thursday September 05 2014 21:19:04 EST Ventricular Rate:  83 PR Interval:    QRS Duration: 92 QT Interval:  486 QTC Calculation: 571 R Axis:   -50 Text Interpretation:  Atrial fibrillation Left anterior fascicular block  Low voltage, precordial leads Probable anteroseptal infarct, old  Nonspecific T abnormalities, lateral leads Prolonged QT interval Baseline  wander in lead(s) V3 lateral changes new since prior tracing Confirmed by  POLLINA  MD, Isabela [phone]) on 09/06/2014 12:30:57 AM      MDM   Final  diagnoses:  Abdominal pain  Acute pancreatitis, unspecified pancreatitis type  Elevated LFTs    9:30 PM Pt seen and examined. Colicky upper abdominal pain. Hx of similar pain, diagnosed with gallstone pancreatitis. Pt moaning in pain. Pain meds ordered. Febrile at 101.5, will give tylenol. Labs including cultures ordered.   11:13 PM Lipase >300, LFTs elevated. CT pending Spoke with [facility], will admit Asked for GI consult.   Pt has been follwed by Dr. Deatra Ina in the past, has not seen them in few years, but requesting his group again.  Spoke with Dr. Oletta Lamas, GI, will consult in AM.     Renold Genta, PA-C 09/06/14 [place], MD 09/08/14 [phone]

## 2014-09-05 NOTE — ED Notes (Signed)
Patient transported to CT 

## 2014-09-05 NOTE — Addendum Note (Signed)
Addended by: Jenna Luo on: 09/05/2014 03:54 PM   Modules accepted: Orders

## 2014-09-05 NOTE — Addendum Note (Signed)
Addended by: Shary Decamp B on: 09/05/2014 04:15 PM   Modules accepted: Orders

## 2014-09-05 NOTE — ED Notes (Signed)
Pt family states pt is having gallbladder problems, has vomited today starting this morning after eating breakfast, states went to see PCP for routine check up and get flu shot but did not mention at that time to the PCP about the abdominal pain, states it starting hurting really bad once ate dinner and vomited again, pt in bed moaning in pain at this time.

## 2014-09-06 ENCOUNTER — Encounter (HOSPITAL_COMMUNITY): Payer: Self-pay | Admitting: General Surgery

## 2014-09-06 ENCOUNTER — Inpatient Hospital Stay (HOSPITAL_COMMUNITY): Payer: Medicare Other

## 2014-09-06 DIAGNOSIS — K859 Acute pancreatitis without necrosis or infection, unspecified: Secondary | ICD-10-CM | POA: Diagnosis present

## 2014-09-06 DIAGNOSIS — K8020 Calculus of gallbladder without cholecystitis without obstruction: Secondary | ICD-10-CM

## 2014-09-06 DIAGNOSIS — I10 Essential (primary) hypertension: Secondary | ICD-10-CM

## 2014-09-06 DIAGNOSIS — I482 Chronic atrial fibrillation: Secondary | ICD-10-CM

## 2014-09-06 DIAGNOSIS — E785 Hyperlipidemia, unspecified: Secondary | ICD-10-CM

## 2014-09-06 DIAGNOSIS — E039 Hypothyroidism, unspecified: Secondary | ICD-10-CM

## 2014-09-06 DIAGNOSIS — Z01810 Encounter for preprocedural cardiovascular examination: Secondary | ICD-10-CM

## 2014-09-06 LAB — CBC
HEMATOCRIT: 32.3 % — AB (ref 39.0–52.0)
HEMOGLOBIN: 11.1 g/dL — AB (ref 13.0–17.0)
MCH: 31.4 pg (ref 26.0–34.0)
MCHC: 34.4 g/dL (ref 30.0–36.0)
MCV: 91.5 fL (ref 78.0–100.0)
Platelets: 241 10*3/uL (ref 150–400)
RBC: 3.53 MIL/uL — AB (ref 4.22–5.81)
RDW: 14.1 % (ref 11.5–15.5)
WBC: 13.3 10*3/uL — ABNORMAL HIGH (ref 4.0–10.5)

## 2014-09-06 LAB — APTT: APTT: 34 s (ref 24–37)

## 2014-09-06 LAB — LIPID PANEL
Cholesterol: 135 mg/dL (ref 0–200)
HDL: 42 mg/dL (ref >39)
LDL Cholesterol: 78 mg/dL (ref 0–99)
Total CHOL/HDL Ratio: 3.2 RATIO
Triglycerides: 77 mg/dL (ref <150)
VLDL: 15 mg/dL (ref 0–40)

## 2014-09-06 LAB — COMPREHENSIVE METABOLIC PANEL
ALBUMIN: 3.5 g/dL (ref 3.5–5.2)
ALK PHOS: 145 U/L — AB (ref 39–117)
ALT: 212 U/L — AB (ref 0–53)
AST: 292 U/L — AB (ref 0–37)
Anion gap: 15 (ref 5–15)
BUN: 26 mg/dL — ABNORMAL HIGH (ref 6–23)
CALCIUM: 8.8 mg/dL (ref 8.4–10.5)
CO2: 22 mEq/L (ref 19–32)
Chloride: 101 mEq/L (ref 96–112)
Creatinine, Ser: 1.11 mg/dL (ref 0.50–1.35)
GFR calc Af Amer: 66 mL/min — ABNORMAL LOW (ref >90)
GFR calc non Af Amer: 57 mL/min — ABNORMAL LOW (ref >90)
Glucose, Bld: 171 mg/dL — ABNORMAL HIGH (ref 70–99)
POTASSIUM: 4.2 meq/L (ref 3.7–5.3)
SODIUM: 138 meq/L (ref 137–147)
TOTAL PROTEIN: 6.6 g/dL (ref 6.0–8.3)
Total Bilirubin: 1.7 mg/dL — ABNORMAL HIGH (ref 0.3–1.2)

## 2014-09-06 LAB — HEMOGLOBIN A1C
HEMOGLOBIN A1C: 6.1 % — AB (ref <5.7)
Mean Plasma Glucose: 128 mg/dL — ABNORMAL HIGH (ref <117)

## 2014-09-06 LAB — GLUCOSE, CAPILLARY
Glucose-Capillary: 127 mg/dL — ABNORMAL HIGH (ref 70–99)
Glucose-Capillary: 77 mg/dL (ref 70–99)
Glucose-Capillary: 113 mg/dL — ABNORMAL HIGH (ref 70–99)
Glucose-Capillary: 90 mg/dL (ref 70–99)

## 2014-09-06 LAB — PROTIME-INR
INR: 1.59 — AB (ref 0.00–1.49)
PROTHROMBIN TIME: 19.1 s — AB (ref 11.6–15.2)

## 2014-09-06 LAB — LIPASE, BLOOD: Lipase: >3000 U/L — ABNORMAL HIGH (ref 11–59)

## 2014-09-06 LAB — PRO B NATRIURETIC PEPTIDE: PRO B NATRI PEPTIDE: 1947 pg/mL — AB (ref 0–450)

## 2014-09-06 MED ORDER — INSULIN ASPART 100 UNIT/ML ~~LOC~~ SOLN
0.0000 [IU] | Freq: Three times a day (TID) | SUBCUTANEOUS | Status: DC
  Administered 2014-09-06 – 2014-09-10 (×3): 1 [IU] via SUBCUTANEOUS

## 2014-09-06 MED ORDER — FENTANYL CITRATE 0.05 MG/ML IJ SOLN
50.0000 ug | INTRAMUSCULAR | Status: DC | PRN
  Administered 2014-09-06: 50 ug via INTRAVENOUS
  Filled 2014-09-06: qty 2

## 2014-09-06 MED ORDER — PIPERACILLIN-TAZOBACTAM 3.375 G IVPB
3.3750 g | Freq: Three times a day (TID) | INTRAVENOUS | Status: DC
  Administered 2014-09-06 – 2014-09-10 (×14): 3.375 g via INTRAVENOUS
  Filled 2014-09-06 (×15): qty 50

## 2014-09-06 MED ORDER — HEPARIN (PORCINE) IN NACL 100-0.45 UNIT/ML-% IJ SOLN
1100.0000 [IU]/h | INTRAMUSCULAR | Status: DC
  Administered 2014-09-06: 1100 [IU]/h via INTRAVENOUS
  Filled 2014-09-06: qty 250

## 2014-09-06 MED ORDER — MOMETASONE FURO-FORMOTEROL FUM 200-5 MCG/ACT IN AERO
2.0000 | INHALATION_SPRAY | Freq: Two times a day (BID) | RESPIRATORY_TRACT | Status: DC
  Administered 2014-09-06 – 2014-09-10 (×8): 2 via RESPIRATORY_TRACT
  Filled 2014-09-06 (×2): qty 8.8

## 2014-09-06 MED ORDER — FLUTICASONE PROPIONATE 50 MCG/ACT NA SUSP
1.0000 | Freq: Every day | NASAL | Status: DC | PRN
Start: 2014-09-06 — End: 2014-09-10

## 2014-09-06 MED ORDER — PRAVASTATIN SODIUM 20 MG PO TABS
20.0000 mg | ORAL_TABLET | Freq: Every day | ORAL | Status: DC
  Filled 2014-09-06: qty 1

## 2014-09-06 MED ORDER — LEVOTHYROXINE SODIUM 100 MCG IV SOLR
12.5000 ug | Freq: Every day | INTRAVENOUS | Status: DC
  Administered 2014-09-06 – 2014-09-10 (×5): 12.5 ug via INTRAVENOUS
  Filled 2014-09-06 (×5): qty 5

## 2014-09-06 MED ORDER — FERROUS SULFATE 325 (65 FE) MG PO TABS
325.0000 mg | ORAL_TABLET | Freq: Every day | ORAL | Status: DC
Start: 2014-09-06 — End: 2014-09-10
  Administered 2014-09-06 – 2014-09-10 (×5): 325 mg via ORAL
  Filled 2014-09-06 (×5): qty 1

## 2014-09-06 MED ORDER — AMLODIPINE BESYLATE 5 MG PO TABS
5.0000 mg | ORAL_TABLET | Freq: Every day | ORAL | Status: DC
  Administered 2014-09-06 – 2014-09-10 (×5): 5 mg via ORAL
  Filled 2014-09-06 (×5): qty 1

## 2014-09-06 MED ORDER — LEVOTHYROXINE SODIUM 25 MCG PO TABS
25.0000 ug | ORAL_TABLET | Freq: Every day | ORAL | Status: DC
  Filled 2014-09-06: qty 1

## 2014-09-06 MED ORDER — RIVAROXABAN 20 MG PO TABS
20.0000 mg | ORAL_TABLET | Freq: Every day | ORAL | Status: DC
  Filled 2014-09-06: qty 1

## 2014-09-06 MED ORDER — SODIUM CHLORIDE 0.9 % IV SOLN
INTRAVENOUS | Status: DC
  Administered 2014-09-06: 01:00:00 via INTRAVENOUS

## 2014-09-06 MED ORDER — CETYLPYRIDINIUM CHLORIDE 0.05 % MT LIQD
7.0000 mL | Freq: Two times a day (BID) | OROMUCOSAL | Status: DC
  Administered 2014-09-06 – 2014-09-09 (×7): 7 mL via OROMUCOSAL

## 2014-09-06 MED ORDER — SODIUM CHLORIDE 0.9 % IJ SOLN
3.0000 mL | Freq: Two times a day (BID) | INTRAMUSCULAR | Status: DC
  Administered 2014-09-07 – 2014-09-10 (×4): 3 mL via INTRAVENOUS

## 2014-09-06 MED ORDER — MORPHINE SULFATE 2 MG/ML IJ SOLN: 1.0000 mg | INTRAMUSCULAR | Status: DC | PRN

## 2014-09-06 MED ORDER — SODIUM CHLORIDE 0.9 % IV SOLN
INTRAVENOUS | Status: DC
  Administered 2014-09-07 – 2014-09-09 (×4): via INTRAVENOUS

## 2014-09-06 MED ORDER — PANTOPRAZOLE SODIUM 40 MG IV SOLR
40.0000 mg | Freq: Every day | INTRAVENOUS | Status: DC
  Administered 2014-09-06 – 2014-09-10 (×5): 40 mg via INTRAVENOUS
  Filled 2014-09-06 (×5): qty 40

## 2014-09-06 MED ORDER — CHLORHEXIDINE GLUCONATE 0.12 % MT SOLN
15.0000 mL | Freq: Two times a day (BID) | OROMUCOSAL | Status: DC
  Administered 2014-09-06 – 2014-09-10 (×8): 15 mL via OROMUCOSAL
  Filled 2014-09-06 (×12): qty 15

## 2014-09-06 MED ORDER — PANTOPRAZOLE SODIUM 40 MG PO TBEC
40.0000 mg | DELAYED_RELEASE_TABLET | Freq: Every day | ORAL | Status: DC
  Filled 2014-09-06: qty 1

## 2014-09-06 MED ORDER — LOSARTAN POTASSIUM 50 MG PO TABS
50.0000 mg | ORAL_TABLET | Freq: Every day | ORAL | Status: DC
  Administered 2014-09-06 – 2014-09-10 (×5): 50 mg via ORAL
  Filled 2014-09-06 (×5): qty 1

## 2014-09-06 MED ORDER — SODIUM CHLORIDE 0.9 % IV SOLN
INTRAVENOUS | Status: DC
  Administered 2014-09-06: 13:00:00 via INTRAVENOUS
  Filled 2014-09-06 (×2): qty 1000

## 2014-09-06 NOTE — Progress Notes (Signed)
ANTICOAGULATION CONSULT NOTE - Initial Consult  Pharmacy Consult for Heparin Indication: atrial fibrillation  No Known Allergies  Patient Measurements: Height: 6' (182.9 cm) Weight: 160 lb 15 oz (73 kg) IBW/kg (Calculated) : 77.6 Heparin Dosing Weight: using total body weight of 73 kg  Vital Signs: Temp: 99.1 F (37.3 C) (11/06 1412) Temp Source: Oral (11/06 1412) BP: 108/47 mmHg (11/06 1412) Pulse Rate: 42 (11/06 1412)  Labs:  Recent Labs  09/05/14 2134 09/06/14 0032  HGB 12.0* 11.1*  HCT 35.5* 32.3*  PLT 255 241  APTT  --  34  LABPROT  --  19.1*  INR  --  1.59*  CREATININE 1.16 1.11    Estimated Creatinine Clearance: 47.5 mL/min (by C-G formula based on Cr of 1.11).   Medical History: Past Medical History  Diagnosis Date  . Asthma   . Hypertension   . Diabetes mellitus   . Cancer   . A-fib   . Hyperlipidemia   . Colon cancer   . Shortness of breath   . CHF (congestive heart failure) 10/28/2011  . Aortic stenosis     Mild  . Forgetfulness   . PVC's (premature ventricular contractions)   . Mild aortic insufficiency     Assessment: 39 yoM on Xarelto 20 mg daily PTA for atrial fibrillation admitted with acute gallstone pancreatitis (second occurrence this year, refused cholecystectomy 11/2013).  GI recommending surgical consult for cholecystectomy.  Holding Xarelto for possible surgery and pharmacy consulted to start heparin infusion while off Xarelto.  Last dose of Xarelto was 11/5; however, cannot determine what time of day this was taken.  Patient has an aid that administers his medications and the patient's daughter provided the information for the medication history and was able to say which medications that he received yesterday but unaware of times of day (takes meds both in AM and PM).  Once daily Xarelto for afib typically given with dinner.  We are likely getting close to 24 hours since last dose.  Baseline anticoags: aPTT WNL, INR 1.59 CBC: Hgb  11.1, platelets WNL Renal: SCr 1.11, CrCl~47 ml/min   Goal of Therapy:  Heparin level 0.3-0.7 units/ml Monitor platelets by anticoagulation protocol: Yes   Plan:  1.  Start heparin infusion now (without bolus due to recent Xarelto administration) at 1100 units/hr. 2.  Check heparin level and aPTT in 8 hours.  Will need to check aPTT until heparin levels correlate since Xarelto can alter heparin level result. 3.  Daily HL and CBC while on heparin.  F/u plans for surgery.  Hershal Coria 09/06/2014,2:53 PM

## 2014-09-06 NOTE — Plan of Care (Signed)
Problem: Phase II Progression Outcomes Goal: Nausea/vomiting controlled with medication Outcome: Completed/Met Date Met:  09/06/14

## 2014-09-06 NOTE — Progress Notes (Signed)
[facility] PROGRESS NOTE  Todd [name] SHF:[id number] DOB: [birth year] DOA: 09/05/2014 PCP: Jenna Luo TOM, MD  Assessment/Plan: #1 gallstone pancreatitis This is patient's second recurrence this year. Patient had refused cholecystectomy in January 2015. Patient would likely chronic cholecystitis. Abdominal ultrasound with, bile duct at the upper limits of normal with possible sludge. Patient with some clinical improvement denied any abdominal pain no nausea no vomiting. Lipase level still elevated at greater than 3000.LFTs elevated. Fasting lipid panel with a triglyceride level of 77. Patient also noted to have a leukocytosis. Continue IV antibiotics. Will continue bowel rest, gentle hydration as patient does have a history of CHF, aortic stenosis and atrial fibrillation. Xarelto on hold. GI has been consulted patient might need a ERCP. General surgery has also been consulted per GI to reassess for possible cholecystectomy. Will consult with cardiology for preop clearance secondary to patient's complicated cardiac history and anticoagulation in terms of his Xarelto. Follow.  #2 atrial fibrillation Currently rate controlled. Xarelto on hold. Will place on IV heparin while anticoagulation on hold secondary to prior history of CVA. Cardiology to also address anticoagulation.  #3 chronic diastolic CHF 2-D echo from January 2013 with a EF of 65%. Patient looks euvolemic clinically with no edema and no crackles noted on examination. proBNP was elevated.Diuretics on hold. Gentle hydration.cardiology consultation pending.  #4 well-controlled diabetes mellitus Hemoglobin A1c 6.1.CBGs have ranged from 77-127. Continue sliding scale insulin.  #5 hypothyroidism Continue Synthroid.  #6 hypertension Continue Cozaar, Norvasc  #7 hyperlipidemia LDL of 78. Hold statin secondary to elevated LFTs.  #8 asthma Stable.  #9 prophylaxis Heparin for DVT prophylaxis.  Code Status: DNR Family  Communication: updated patient, no family present. Disposition Plan: Home when medically stable.   Consultants:  GI: [facility]  CCS pending  Procedures:  Abdominal ultrasound 09/06/2014  CT abd/pelvis 09/05/14  Antibiotics:  Zosyn 09/06/2014  HPI/Subjective: Patient denies any abdominal pain. Patient denies any nausea. No emesis. Patient states he's feeling better.  Objective: Filed Vitals:   09/06/14 0900  BP: 114/90  Pulse: 61  Temp: 98.3 F (36.8 C)  Resp: 20    Intake/Output Summary (Last 24 hours) at 09/06/14 1014 Last data filed at 09/06/14 0916  Gross per 24 hour  Intake    485 ml  Output    575 ml  Net    -90 ml   Filed Weights   09/06/14 0015 09/06/14 0425  Weight: 73 kg (160 lb 15 oz) 73 kg (160 lb 15 oz)    Exam:   General:  NAD  Cardiovascular: RRR with 3/6 SEM  Respiratory: CTAB  Abdomen: soft, nontender, nondistended, positive bowel sounds.  Musculoskeletal: no clubbing cyanosis or edema.  Data Reviewed: Basic Metabolic Panel:  Recent Labs Lab 09/05/14 2134 09/06/14 0032  NA 139 138  K 4.2 4.2  CL 101 101  CO2 22 22  GLUCOSE 162* 171*  BUN 28* 26*  CREATININE 1.16 1.11  CALCIUM 9.6 8.8   Liver Function Tests:  Recent Labs Lab 09/05/14 2134 09/06/14 0032  AST 272* 292*  ALT 176* 212*  ALKPHOS 156* 145*  BILITOT 1.3* 1.7*  PROT 7.3 6.6  ALBUMIN 3.8 3.5    Recent Labs Lab 09/05/14 2134  LIPASE >3000*   No results for input(s): AMMONIA in the last 168 hours. CBC:  Recent Labs Lab 09/05/14 2134 09/06/14 0032  WBC 12.1* 13.3*  NEUTROABS 10.7*  --   HGB 12.0* 11.1*  HCT 35.5* 32.3*  MCV 92.2 91.5  PLT 255 241   Cardiac Enzymes: No results for input(s): CKTOTAL, CKMB, CKMBINDEX, TROPONINI in the last 168 hours. BNP (last 3 results)  Recent Labs  09/06/14 0033  PROBNP 1947.0*   CBG:  Recent Labs Lab 09/06/14 0801  GLUCAP 127*    No results found for this or any previous visit (from the  past 240 hour(s)).   Studies: Ct Abdomen Pelvis W Contrast  09/06/2014   CLINICAL DATA:  Left upper abdominal and right abdominal pain associated with nausea and vomiting. History of gallbladder problems. History of colon cancer and diabetes.  EXAM: CT ABDOMEN AND PELVIS WITH CONTRAST  TECHNIQUE: Multidetector CT imaging of the abdomen and pelvis was performed using the standard protocol following bolus administration of intravenous contrast.  CONTRAST:  182mL OMNIPAQUE IOHEXOL 300 MG/ML  SOLN  COMPARISON:  11/23/2013  FINDINGS: Mild cardiac enlargement. Coronary artery calcification. Infiltration in both lung bases with bronchial wall thickening suggesting bronchitis.  Gallbladder is distended with mild gallbladder wall thickening. Small stones in the gallbladder. No infiltration in the pericholecystic fat. No bile duct dilatation. Similar appearance to previous study. The liver, spleen, adrenal glands, inferior vena cava, and retroperitoneal lymph nodes are unremarkable. Calcification of abdominal aorta and branch vessels without aneurysm. Multiple renal cysts and parenchymal atrophy. No hydronephrosis. Pancreas is unremarkable with improved inflammatory change since previous study. No residual pseudo cysts. Small esophageal hiatal hernia. Stomach is otherwise unremarkable. Small bowel appear normal. Stool-filled colon without distention or wall thickening. No free air or free fluid in the abdomen.  Pelvis: Appendix is probably surgically absent. Bladder wall is not thickened. Small bladder wall calcifications are present. Prostate gland is enlarged with calcification. Diverticulosis of sigmoid colon without evidence of diverticulitis. No free or loculated pelvic fluid collections. No pelvic mass or lymphadenopathy diffuse bone demineralization and degenerative changes. Multiple vertebral compression fractures consistent with osteoporosis. Appearance is unchanged since prior study.  IMPRESSION: Infiltration  and bronchial wall thickening in the lung bases suggesting bronchitis. Distended gallbladder with cholelithiasis and mild gallbladder wall thickening, similar to prior study. Enlarged prostate gland. Changes of osteoporosis with multiple vertebral compression fractures.   Electronically Signed   By: Lucienne Capers M.D.   On: 09/06/2014 00:06   Dg Chest Port 1 View  09/05/2014   CLINICAL DATA:  Fever beginning today. Personal history of colon cancer, asthma, hypertension, atrial fibrillation and congestive heart failure.  EXAM: PORTABLE CHEST - 1 VIEW  COMPARISON:  11/23/2013 and previous  FINDINGS: The heart is at the upper limits of normal in size. There is calcification of the aorta. There is mild chronic scarring in the right mid lung and at both lung bases but no sign of active infiltrate, effusion or collapse.  IMPRESSION: Areas of chronic pulmonary scarring.  No active process evident.   Electronically Signed   By: Nelson Chimes M.D.   On: 09/05/2014 22:41   US Abdomen Limited Ruq  09/06/2014   CLINICAL DATA:  Epigastric abdominal pain. Elevated serum transaminases, hyperbilirubinemia, and elevated serum alkaline phosphatase. Prior history of colon cancer.  EXAM: US ABDOMEN LIMITED - RIGHT UPPER QUADRANT  COMPARISON:  CT abdomen and pelvis yesterday.  FINDINGS: Gallbladder:  Multiple small shadowing gallstones and moderate amount of echogenic sludge. Largest gallstone on the order of 7 mm. Gallbladder wall thickening up to approximately 6 mm. No pericholecystic fluid. Negative sonographic Murphy sign according to the ultrasound technologist.  Common bile duct:  Diameter: Approximately 7 mm.  No visible bile duct stones.  Distal duct obscured by duodenum bowel gas.  Liver:  Normal size and echotexture without focal parenchymal abnormality. Patent portal vein with hepatopetal flow.  IMPRESSION: 1. Cholelithiasis, gallbladder sludge, and gallbladder wall thickening. Negative sonographic Murphy sign. Chronic  cholecystitis is suspected. 2. Upper normal caliber common bile duct. While no bile duct stone was visible on the current examination, review of yesterday's CT suggests that there may be sludge in the distal common bile duct. 3. Normal sonographic appearance of the liver.   Electronically Signed   By: Evangeline Dakin M.D.   On: 09/06/2014 09:45    Scheduled Meds: . amLODipine  5 mg Oral Daily  . antiseptic oral rinse  7 mL Mouth Rinse q12n4p  . chlorhexidine  15 mL Mouth Rinse BID  . ferrous sulfate  325 mg Oral Q breakfast  . insulin aspart  0-9 Units Subcutaneous TID WC  . levothyroxine  12.5 mcg Intravenous Daily  . losartan  50 mg Oral Daily  . mometasone-formoterol  2 puff Inhalation BID  . pantoprazole (PROTONIX) IV  40 mg Intravenous Q1200  . piperacillin-tazobactam (ZOSYN)  IV  3.375 g Intravenous 3 times per day  . pravastatin  20 mg Oral Daily  . sodium chloride  3 mL Intravenous Q12H   Continuous Infusions: . sodium chloride 0.9 % 1,000 mL infusion 75 mL/hr at 09/06/14 0820    Principal Problem:   Gallstone pancreatitis Active Problems:   HLD (hyperlipidemia)   Essential hypertension   Asthma   Atrial fibrillation   CHF (congestive heart failure)   Hypothyroidism    Time spent: 23 mins    [facility] MD [facility] Pager [phone]. If 7PM-7AM, please contact night-coverage at [email or web address], password [facility] 09/06/2014, 10:14 AM  LOS: 1 day

## 2014-09-06 NOTE — Plan of Care (Signed)
Problem: Phase II Progression Outcomes Goal: Tolerates PO clear liquids Outcome: Completed/Met Date Met:  09/06/14

## 2014-09-06 NOTE — Consult Note (Signed)
Referring Provider: No ref. provider found Primary Care Physician:  Odette Fraction, MD Primary Gastroenterologist:  Dr. Deatra Ina  Reason for Consultation:  Gallstone pancreatitis    HPI: Todd [name] is a 80 y.o. male with past medical history of diabetes type 2, atrial fibrillation on Xarelto, asthma, congestive heart failure, hypertension, hyperlipidemia, hypothyroidism, who presented to [facility] hospital with nausea, vomiting, and abdominal pain.  Patient's abdominal pain started yesterday morning, was associated with fever, chills, nausea, vomiting, but no diarrhea.  Of note the patient had gallstone pancreatitis 10 months ago. 11/2013. He refused cholecystectomy on that admission.   Work up in the ED demonstrated acute pancreatitis with lipase >3000.  Elevated ALT, AST, ALP, and total bilirubin (176, 272, 156, and 1.3, respectively). Leukocytosis with WBC 12.1.  CT scan of the abdomen and pelvis showed the following:  "IMPRESSION: Infiltration and bronchial wall thickening in the lung bases suggesting bronchitis. Distended gallbladder with cholelithiasis and mild gallbladder wall thickening, similar to prior study. Enlarged prostate gland. Changes of osteoporosis with multiple vertebral compression fractures."  There was no biliary ductal dilitation.  Pancreas is unremarkable with improved inflammatory change since previous study in 11/2013.  He is on Zosyn.  LFT's this AM are as follows:  ALT 212, AST 292, ALP 145, and total bili 1.7.  WBC count up to 13.3 this AM.  Lipase remains >3000.  Ultrasound was ordered this AM and showed the following:  "IMPRESSION: 1. Cholelithiasis, gallbladder sludge, and gallbladder wall thickening. Negative sonographic Murphy sign. Chronic cholecystitis is suspected. 2. Upper normal caliber common bile duct. While no bile duct stone was visible on the current examination, review of yesterday's CT suggests that there may be sludge in the distal common  bile duct. 3. Normal sonographic appearance of the liver."  He was febrile O/N with temp of 101.5.  He says that he feels much better this morning.  Not complaining of any abdominal pain at present.   Past Medical History  Diagnosis Date  . Asthma   . Hypertension   . Diabetes mellitus   . Cancer   . A-fib   . Hyperlipidemia   . Colon cancer   . Shortness of breath   . CHF (congestive heart failure) 10/28/2011  . Aortic stenosis     Mild  . Forgetfulness   . PVC's (premature ventricular contractions)   . Mild aortic insufficiency     Past Surgical History  Procedure Laterality Date  . Colon surgery      Partial hemecolectomy   . Central line insertion  09/08/2011         Prior to Admission medications   Medication Sig Start Date End Date Taking? Authorizing Provider  amLODipine (NORVASC) 5 MG tablet Take 5 mg by mouth daily.   Yes Historical Provider, MD  ferrous sulfate 325 (65 FE) MG tablet Take 325 mg by mouth at bedtime.   Yes Historical Provider, MD  fluticasone (FLONASE) 50 MCG/ACT nasal spray Place 2 sprays into both nostrils daily.   Yes Historical Provider, MD  Fluticasone-Salmeterol (ADVAIR) 500-50 MCG/DOSE AEPB Inhale 1 puff into the lungs 2 (two) times daily.   Yes Historical Provider, MD  furosemide (LASIX) 20 MG tablet Take 20 mg by mouth daily.   Yes Historical Provider, MD  levothyroxine (SYNTHROID, LEVOTHROID) 25 MCG tablet Take 25 mcg by mouth daily.   Yes Historical Provider, MD  losartan (COZAAR) 25 MG tablet Take 25 mg by mouth daily.   Yes Historical Provider, MD  omeprazole (PRILOSEC) 40 MG capsule Take 40 mg by mouth daily.   Yes Historical Provider, MD  pravastatin (PRAVACHOL) 20 MG tablet Take 20 mg by mouth daily.   Yes Historical Provider, MD  rivaroxaban (XARELTO) 20 MG TABS tablet Take 20 mg by mouth daily with supper.   Yes Historical Provider, MD  ADVAIR DISKUS 500-50 MCG/DOSE AEPB INHALE 1 PUFF TWICE A DAY 12/11/13   Susy Frizzle, MD    amLODipine (NORVASC) 5 MG tablet TAKE 1 TABLET BY MOUTH DAILY 07/23/14   Susy Frizzle, MD  ferrous gluconate (FERGON) 325 MG tablet Take 325 mg by mouth at bedtime.    Historical Provider, MD  ferrous sulfate 325 (65 FE) MG tablet TAKE 1 TABLET EVERY DAY 12/02/13   Susy Frizzle, MD  fluticasone [facility]) 50 MCG/ACT nasal spray USE 2 SPRAYS IN EACH NOSTRIL ONCE A DAY 05/24/14   Susy Frizzle, MD  furosemide (LASIX) 20 MG tablet TAKE 1 TABLET EVERY DAY 08/20/14   Susy Frizzle, MD  KLOR-CON M20 20 MEQ tablet TAKE 1/2 TABLET (10 MEQ TOTAL) BY MOUTH 2 (TWO) TIMES DAILY. 12/28/13   Susy Frizzle, MD  levothyroxine (SYNTHROID, LEVOTHROID) 25 MCG tablet TAKE 1 TABLET BY MOUTH EVERY DAY 03/27/14   Susy Frizzle, MD  losartan (COZAAR) 50 MG tablet TAKE 1 TABLET EVERY DAY 04/24/14   Susy Frizzle, MD  omeprazole (PRILOSEC) 40 MG capsule TAKE ONE CAPSULE BY MOUTH DAILY 06/24/14   Susy Frizzle, MD  potassium chloride SA (K-DUR,KLOR-CON) 20 MEQ tablet Take 20 mEq by mouth 2 (two) times daily.    Historical Provider, MD  potassium chloride SA (K-DUR,KLOR-CON) 20 MEQ tablet Take 10 mEq by mouth 2 (two) times daily.    Historical Provider, MD  pravastatin (PRAVACHOL) 20 MG tablet TAKE 1 TABLET EVERY DAY 02/25/14   Susy Frizzle, MD  Rivaroxaban (XARELTO) 20 MG TABS tablet ONE BY MOUTH DAILY WITH EVENING MEAL 12/13/13   Susy Frizzle, MD    Current Facility-Administered Medications  Medication Dose Route Frequency Provider Last Rate Last Dose  . amLODipine (NORVASC) tablet 5 mg  5 mg Oral Daily Ivor Costa, MD      . antiseptic oral rinse (CPC / CETYLPYRIDINIUM CHLORIDE 0.05%) solution 7 mL  7 mL Mouth Rinse q12n4p Ivor Costa, MD      . chlorhexidine (PERIDEX) 0.12 % solution 15 mL  15 mL Mouth Rinse BID Ivor Costa, MD   15 mL at 09/06/14 0818  . fentaNYL (SUBLIMAZE) injection 50 mcg  50 mcg Intravenous Q2H PRN Tatyana A Kirichenko, PA-C      . ferrous sulfate tablet 325 mg  325 mg Oral Q  breakfast Ivor Costa, MD   325 mg at 09/06/14 0818  . fluticasone (FLONASE) 50 MCG/ACT nasal spray 1 spray  1 spray Each Nare Daily PRN Ivor Costa, MD      . HYDROmorphone (DILAUDID) injection 1 mg  1 mg Intravenous Q3H PRN Ivor Costa, MD   1 mg at 09/05/14 2322  . insulin aspart (novoLOG) injection 0-9 Units  0-9 Units Subcutaneous TID WC Ivor Costa, MD   1 Units at 09/06/14 [phone]  . levothyroxine (SYNTHROID, LEVOTHROID) injection 12.5 mcg  12.5 mcg Intravenous Daily Irine Seal V, MD      . losartan (COZAAR) tablet 50 mg  50 mg Oral Daily Ivor Costa, MD      . mometasone-formoterol [facility]) 200-5 MCG/ACT inhaler 2 puff  2 puff Inhalation  BID Lorretta Harp, MD   2 puff at 09/06/14 0034  . ondansetron (ZOFRAN) injection 4 mg  4 mg Intravenous Q8H PRN Lorretta Harp, MD   4 mg at 09/05/14 2322  . pantoprazole (PROTONIX) injection 40 mg  40 mg Intravenous Q1200 Rodolph Bong, MD   40 mg at 09/06/14 3147  . piperacillin-tazobactam (ZOSYN) IVPB 3.375 g  3.375 g Intravenous 3 times per day Lorretta Harp, MD   3.375 g at 09/06/14 0620  . pravastatin (PRAVACHOL) tablet 20 mg  20 mg Oral Daily Lorretta Harp, MD      . sodium chloride 0.9 % 1,000 mL infusion   Intravenous Continuous Rodolph Bong, MD 75 mL/hr at 09/06/14 0820    . sodium chloride 0.9 % injection 3 mL  3 mL Intravenous Q12H Lorretta Harp, MD   3 mL at 09/06/14 0030    Allergies as of 09/05/2014  . (No Known Allergies)    Family History  Problem Relation Age of Onset  . Hyperlipidemia Father   . Hypertension Father   . Heart disease Father   . Asthma Father   . Stroke Father   . Heart failure Father   . Heart disease Mother   . Heart failure Mother   . Heart attack Sister     History   Social History  . Marital Status: Married    Spouse Name: N/A    Number of Children: N/A  . Years of Education: N/A   Occupational History  . Not on file.   Social History Main Topics  . Smoking status: Former Games developer  . Smokeless tobacco: Not on  file     Comment: quit 30+ yrs ago  . Alcohol Use: No  . Drug Use: No  . Sexual Activity: Not on file   Other Topics Concern  . Not on file   Social History Narrative    Review of Systems: Ten point ROS is O/W negative except as mentioned in HPI.  Physical Exam: Vital signs in last 24 hours: Temp:  [97.6 F (36.4 C)-101.5 F (38.6 C)] 98 F (36.7 C) (11/06 0425) Pulse Rate:  [68-79] 68 (11/06 0425) Resp:  [20-26] 22 (11/06 0425) BP: (114-170)/(46-63) 114/46 mmHg (11/06 0425) SpO2:  [94 %-100 %] 96 % (11/06 0425) Weight:  [160 lb 15 oz (73 kg)-163 lb (73.936 kg)] 160 lb 15 oz (73 kg) (11/06 0425) Last BM Date: 09/05/14 General:   Alert, elderly white male, pleasant and cooperative in NAD Head:  Normocephalic and atraumatic. Eyes:  Sclera clear, no icterus.  Conjunctiva pink. Ears:  Normal auditory acuity. Mouth:  No deformity or lesions. Lungs:  Clear throughout to auscultation.  No wheezes, crackles, or rhonchi.  Heart:  Regular rate and rhythm; no murmurs, clicks, rubs, or gallops. Abdomen:  Soft, non-distended.  BS present.  Very mild epigastric TTP without R/R/G. Rectal:  Deferred  Msk:  Symmetrical without gross deformities. Pulses:  Normal pulses noted. Extremities:  Without clubbing or edema. Neurologic:  Alert and  oriented x4;  grossly normal neurologically. Skin:  Intact without significant lesions or rashes. Psych:  Alert and cooperative. Normal mood and affect.  Intake/Output from previous day: 11/05 0701 - 11/06 0700 In: 485 [I.V.:435; IV Piggyback:50] Out: 100 [Urine:100] Intake/Output this shift: Total I/O In: -  Out: 350 [Urine:350]  Lab Results:  Recent Labs  09/05/14 2134 09/06/14 0032  WBC 12.1* 13.3*  HGB 12.0* 11.1*  HCT 35.5* 32.3*  PLT 255 241   BMET  Recent Labs  09/05/14 2134 09/06/14 0032  NA 139 138  K 4.2 4.2  CL 101 101  CO2 22 22  GLUCOSE 162* 171*  BUN 28* 26*  CREATININE 1.16 1.11  CALCIUM 9.6 8.8    LFT  Recent Labs  09/06/14 0032  PROT 6.6  ALBUMIN 3.5  AST 292*  ALT 212*  ALKPHOS 145*  BILITOT 1.7*   PT/INR  Recent Labs  09/06/14 0032  LABPROT 19.1*  INR 1.59*   Studies/Results: Ct Abdomen Pelvis W Contrast  09/06/2014   CLINICAL DATA:  Left upper abdominal and right abdominal pain associated with nausea and vomiting. History of gallbladder problems. History of colon cancer and diabetes.  EXAM: CT ABDOMEN AND PELVIS WITH CONTRAST  TECHNIQUE: Multidetector CT imaging of the abdomen and pelvis was performed using the standard protocol following bolus administration of intravenous contrast.  CONTRAST:  174mL OMNIPAQUE IOHEXOL 300 MG/ML  SOLN  COMPARISON:  11/23/2013  FINDINGS: Mild cardiac enlargement. Coronary artery calcification. Infiltration in both lung bases with bronchial wall thickening suggesting bronchitis.  Gallbladder is distended with mild gallbladder wall thickening. Small stones in the gallbladder. No infiltration in the pericholecystic fat. No bile duct dilatation. Similar appearance to previous study. The liver, spleen, adrenal glands, inferior vena cava, and retroperitoneal lymph nodes are unremarkable. Calcification of abdominal aorta and branch vessels without aneurysm. Multiple renal cysts and parenchymal atrophy. No hydronephrosis. Pancreas is unremarkable with improved inflammatory change since previous study. No residual pseudo cysts. Small esophageal hiatal hernia. Stomach is otherwise unremarkable. Small bowel appear normal. Stool-filled colon without distention or wall thickening. No free air or free fluid in the abdomen.  Pelvis: Appendix is probably surgically absent. Bladder wall is not thickened. Small bladder wall calcifications are present. Prostate gland is enlarged with calcification. Diverticulosis of sigmoid colon without evidence of diverticulitis. No free or loculated pelvic fluid collections. No pelvic mass or lymphadenopathy diffuse bone  demineralization and degenerative changes. Multiple vertebral compression fractures consistent with osteoporosis. Appearance is unchanged since prior study.  IMPRESSION: Infiltration and bronchial wall thickening in the lung bases suggesting bronchitis. Distended gallbladder with cholelithiasis and mild gallbladder wall thickening, similar to prior study. Enlarged prostate gland. Changes of osteoporosis with multiple vertebral compression fractures.   Electronically Signed   By: Lucienne Capers M.D.   On: 09/06/2014 00:06   Dg Chest Port 1 View  09/05/2014   CLINICAL DATA:  Fever beginning today. Personal history of colon cancer, asthma, hypertension, atrial fibrillation and congestive heart failure.  EXAM: PORTABLE CHEST - 1 VIEW  COMPARISON:  11/23/2013 and previous  FINDINGS: The heart is at the upper limits of normal in size. There is calcification of the aorta. There is mild chronic scarring in the right mid lung and at both lung bases but no sign of active infiltrate, effusion or collapse.  IMPRESSION: Areas of chronic pulmonary scarring.  No active process evident.   Electronically Signed   By: Nelson Chimes M.D.   On: 09/05/2014 22:41    IMPRESSION:  -Gallstone pancreatitis:  Second occurrence this year.  Refused cholecystectomy in 11/2013.  Likely chronic cholecystitis on U/S.  CBD upper limits of normal with possible sludge.  Met criteria for cholangitis. -Atrial fibrillation:  On Xarelto; this was held this AM. -CHF:  Elevated BNP on admission.  PLAN: -Supportive care with NPO status, IVF's, pain control, anti-emetics.  IVF's should be increased if possible, but with elevated BNP and history of CHF this will be difficult.  Continue  empiric broad spectrum antibiotics. -Trend LFT's. -Should have another surgical consult to re-consider cholecystectomy to help prevent future recurrences.  I spoke with surgery and they will see him. -Should probably have ERCP with sphincterotomy.  Will discuss  further with Dr. Hilarie Fredrickson.  ? If he could get by with ERCP and sphincterotomy without cholecystectomy. -Dr. Grandville Silos will touch base with cardiology regarding continuing to hold Xarelto.   Harden Bramer D.  09/06/2014, 9:05 AM  Pager number [phone]

## 2014-09-06 NOTE — Plan of Care (Signed)
Problem: Phase III Progression Outcomes Goal: Discharge plan remains appropriate-arrangements made Outcome: Completed/Met Date Met:  09/06/14

## 2014-09-06 NOTE — Consult Note (Addendum)
CONSULTATION NOTE  Reason for Consult: Pre-operative risk assessment  Requesting Physician: Dr. Marcello Moores  Cardiologist: Dr. Harrington Challenger  HPI: This is a 80 y.o. male patient of Dr. Harrington Challenger, last seen in the [facility] office by Ermalinda Barrios, [place] in 2013. He has a past medical history significant for atrial fibrillation on Xarelto, mild AI and mild MR, HTN and dyslipidemia. He had an admission November 2012 with an aspiration pneumonia treated with IV Abx and mech ventilation. He suffered an NSTEMI Peak trop 2.76. Peak CK/B was 276/13.8. Echo 09/08/11 showed LVEF was 20 to 25%. follow up 2-D echo on 11/09/11 showed normalization of ejection fraction EF 65% with no wall motion abnormality. He was also admitted in January 2015 with abdominal pain and was diagnosed with acute gallstone pancreatitis. Apparently he refused cholecystectomy at that time.  He now presents with nausea, vomiting and abdominal pain. His presentation is again consistent with gallstone pancreatitis, with lipase >3000. Antibiotics were initiated and surgery has been recommended. There is high risk for conversion to open cholecystectomy and there is concern that he may be too frail to recover from that.  Cardiology is asked to provide risk stratification.  PMHx:  Past Medical History  Diagnosis Date  . Asthma   . Hypertension   . Diabetes mellitus   . Cancer   . A-fib   . Hyperlipidemia   . Colon cancer   . Shortness of breath   . CHF (congestive heart failure) 10/28/2011  . Aortic stenosis     Mild  . Forgetfulness   . PVC's (premature ventricular contractions)   . Mild aortic insufficiency    Past Surgical History  Procedure Laterality Date  . Colon surgery      Partial hemecolectomy   . Central line insertion  09/08/2011         FAMHx: Family History  Problem Relation Age of Onset  . Hyperlipidemia Father   . Hypertension Father   . Heart disease Father   . Asthma Father   . Stroke Father   . Heart  failure Father   . Heart disease Mother   . Heart failure Mother   . Heart attack Sister     SOCHx:  reports that he has quit smoking. He does not have any smokeless tobacco history on file. He reports that he does not drink alcohol or use illicit drugs.  ALLERGIES: No Known Allergies  ROS: Review of systems not obtained due to patient factors.  HOME MEDICATIONS: Prescriptions prior to admission  Medication Sig Dispense Refill Last Dose  . amLODipine (NORVASC) 5 MG tablet Take 5 mg by mouth daily.   09/05/2014 at Unknown time  . ferrous sulfate 325 (65 FE) MG tablet Take 325 mg by mouth at bedtime.   09/04/2014 at Unknown time  . fluticasone (FLONASE) 50 MCG/ACT nasal spray Place 2 sprays into both nostrils daily.   09/05/2014 at Unknown time  . fluticasone (FLONASE) 50 MCG/ACT nasal spray Place 2 sprays into both nostrils daily.   09/05/2014 at Unknown time  . Fluticasone-Salmeterol (ADVAIR) 500-50 MCG/DOSE AEPB Inhale 1 puff into the lungs 2 (two) times daily.   09/05/2014 at Unknown time  . Fluticasone-Salmeterol (ADVAIR) 500-50 MCG/DOSE AEPB Inhale 1 puff into the lungs 2 (two) times daily.   09/05/2014 at Unknown time  . furosemide (LASIX) 20 MG tablet Take 20 mg by mouth daily.   09/05/2014 at Unknown time  . levothyroxine (SYNTHROID, LEVOTHROID) 25 MCG tablet Take 25 mcg by  mouth daily.   09/05/2014 at Unknown time  . losartan (COZAAR) 25 MG tablet Take 25 mg by mouth daily.   09/05/2014 at Unknown time  . omeprazole (PRILOSEC) 40 MG capsule Take 40 mg by mouth daily.   09/05/2014 at Unknown time  . potassium chloride SA (K-DUR,KLOR-CON) 20 MEQ tablet Take 10 mEq by mouth 2 (two) times daily.   Past Week at Unknown time  . pravastatin (PRAVACHOL) 20 MG tablet Take 20 mg by mouth daily.   09/05/2014 at Unknown time  . rivaroxaban (XARELTO) 20 MG TABS tablet Take 20 mg by mouth daily with supper.   09/05/2014 at unknown     HOSPITAL MEDICATIONS: Scheduled: . amLODipine  5 mg Oral Daily    . antiseptic oral rinse  7 mL Mouth Rinse q12n4p  . chlorhexidine  15 mL Mouth Rinse BID  . ferrous sulfate  325 mg Oral Q breakfast  . insulin aspart  0-9 Units Subcutaneous TID WC  . levothyroxine  12.5 mcg Intravenous Daily  . losartan  50 mg Oral Daily  . mometasone-formoterol  2 puff Inhalation BID  . pantoprazole (PROTONIX) IV  40 mg Intravenous Q1200  . piperacillin-tazobactam (ZOSYN)  IV  3.375 g Intravenous 3 times per day  . sodium chloride  3 mL Intravenous Q12H    VITALS: Blood pressure 108/47, pulse 42, temperature 99.1 F (37.3 C), temperature source Oral, resp. rate 20, height 6' (1.829 m), weight 160 lb 15 oz (73 kg), SpO2 98 %.  PHYSICAL EXAM: General appearance: sleepy, appears stated age, somewhat frail Neck: no carotid bruit and no JVD Lungs: diminished breath sounds bilaterally Heart: irregularly irregular rhythm Abdomen: moderate mid-epigastric tenderness to palpation (he grimaced) Extremities: extremities normal, atraumatic, no cyanosis or edema Pulses: 2+ and symmetric Skin: Skin color, texture, turgor normal. No rashes or lesions Neurologic: Grossly normal Psych: unable to assess  LABS: Results for orders placed or performed during the hospital encounter of 09/05/14 (from the past 48 hour(s))  Urinalysis, Routine w reflex microscopic     Status: Abnormal   Collection Time: 09/05/14  9:21 PM  Result Value Ref Range   Color, Urine YELLOW YELLOW   APPearance TURBID (A) CLEAR   Specific Gravity, Urine 1.014 1.005 - 1.030   pH 6.0 5.0 - 8.0   Glucose, UA 100 (A) NEGATIVE mg/dL   Hgb urine dipstick TRACE (A) NEGATIVE   Bilirubin Urine NEGATIVE NEGATIVE   Ketones, ur NEGATIVE NEGATIVE mg/dL   Protein, ur NEGATIVE NEGATIVE mg/dL   Urobilinogen, UA 1.0 0.0 - 1.0 mg/dL   Nitrite NEGATIVE NEGATIVE   Leukocytes, UA TRACE (A) NEGATIVE  Urine microscopic-add on     Status: None   Collection Time: 09/05/14  9:21 PM  Result Value Ref Range   WBC, UA 3-6 <3  WBC/hpf   Bacteria, UA RARE RARE  CBC with Differential     Status: Abnormal   Collection Time: 09/05/14  9:34 PM  Result Value Ref Range   WBC 12.1 (H) 4.0 - 10.5 K/uL   RBC 3.85 (L) 4.22 - 5.81 MIL/uL   Hemoglobin 12.0 (L) 13.0 - 17.0 g/dL   HCT 35.5 (L) 39.0 - 52.0 %   MCV 92.2 78.0 - 100.0 fL   MCH 31.2 26.0 - 34.0 pg   MCHC 33.8 30.0 - 36.0 g/dL   RDW 14.0 11.5 - 15.5 %   Platelets 255 150 - 400 K/uL   Neutrophils Relative % 89 (H) 43 - 77 %  Neutro Abs 10.7 (H) 1.7 - 7.7 K/uL   Lymphocytes Relative 2 (L) 12 - 46 %   Lymphs Abs 0.3 (L) 0.7 - 4.0 K/uL   Monocytes Relative 9 3 - 12 %   Monocytes Absolute 1.1 (H) 0.1 - 1.0 K/uL   Eosinophils Relative 0 0 - 5 %   Eosinophils Absolute 0.0 0.0 - 0.7 K/uL   Basophils Relative 0 0 - 1 %   Basophils Absolute 0.0 0.0 - 0.1 K/uL  Comprehensive metabolic panel     Status: Abnormal   Collection Time: 09/05/14  9:34 PM  Result Value Ref Range   Sodium 139 137 - 147 mEq/L   Potassium 4.2 3.7 - 5.3 mEq/L   Chloride 101 96 - 112 mEq/L   CO2 22 19 - 32 mEq/L   Glucose, Bld 162 (H) 70 - 99 mg/dL   BUN 28 (H) 6 - 23 mg/dL   Creatinine, Ser 1.16 0.50 - 1.35 mg/dL   Calcium 9.6 8.4 - 10.5 mg/dL   Total Protein 7.3 6.0 - 8.3 g/dL   Albumin 3.8 3.5 - 5.2 g/dL   AST 272 (H) 0 - 37 U/L   ALT 176 (H) 0 - 53 U/L   Alkaline Phosphatase 156 (H) 39 - 117 U/L   Total Bilirubin 1.3 (H) 0.3 - 1.2 mg/dL   GFR calc non Af Amer 54 (L) >90 mL/min   GFR calc Af Amer 63 (L) >90 mL/min    Comment: (NOTE) The eGFR has been calculated using the CKD EPI equation. This calculation has not been validated in all clinical situations. eGFR's persistently <90 mL/min signify possible Chronic Kidney Disease.    Anion gap 16 (H) 5 - 15  Lipase, blood     Status: Abnormal   Collection Time: 09/05/14  9:34 PM  Result Value Ref Range   Lipase >3000 (H) 11 - 59 U/L  I-stat troponin, ED (only if pt is 78 y.o. or older & pain is above umbilicus) - do not order at  [facility]     Status: None   Collection Time: 09/05/14  9:45 PM  Result Value Ref Range   Troponin i, poc 0.00 0.00 - 0.08 ng/mL   Comment 3            Comment: Due to the release kinetics of cTnI, a negative result within the first hours of the onset of symptoms does not rule out myocardial infarction with certainty. If myocardial infarction is still suspected, repeat the test at appropriate intervals.   I-Stat CG4 Lactic Acid, ED     Status: None   Collection Time: 09/05/14  9:46 PM  Result Value Ref Range   Lactic Acid, Venous 2.08 0.5 - 2.2 mmol/L  Hemoglobin A1c     Status: Abnormal   Collection Time: 09/06/14 12:32 AM  Result Value Ref Range   Hgb A1c MFr Bld 6.1 (H) <5.7 %    Comment: (NOTE)                                                                       According to the ADA Clinical Practice Recommendations for 2011, when HbA1c is used as a screening test:  >=6.5%   Diagnostic of Diabetes Mellitus           (  if abnormal result is confirmed) 5.7-6.4%   Increased risk of developing Diabetes Mellitus References:Diagnosis and Classification of Diabetes Mellitus,Diabetes ASTM,1962,22(LNLGX 1):S62-S69 and Standards of Medical Care in         Diabetes - 2011,Diabetes QJJH,4174,08 (Suppl 1):S11-S61.    Mean Plasma Glucose 128 (H) <117 mg/dL    Comment: Performed at [facility]     Status: Abnormal   Collection Time: 09/06/14 12:32 AM  Result Value Ref Range   Prothrombin Time 19.1 (H) 11.6 - 15.2 seconds   INR 1.59 (H) 0.00 - 1.49  APTT     Status: None   Collection Time: 09/06/14 12:32 AM  Result Value Ref Range   aPTT 34 24 - 37 seconds  Comprehensive metabolic panel     Status: Abnormal   Collection Time: 09/06/14 12:32 AM  Result Value Ref Range   Sodium 138 137 - 147 mEq/L   Potassium 4.2 3.7 - 5.3 mEq/L   Chloride 101 96 - 112 mEq/L   CO2 22 19 - 32 mEq/L   Glucose, Bld 171 (H) 70 - 99 mg/dL   BUN 26 (H) 6 - 23 mg/dL   Creatinine, Ser  1.11 0.50 - 1.35 mg/dL   Calcium 8.8 8.4 - 10.5 mg/dL   Total Protein 6.6 6.0 - 8.3 g/dL   Albumin 3.5 3.5 - 5.2 g/dL   AST 292 (H) 0 - 37 U/L   ALT 212 (H) 0 - 53 U/L   Alkaline Phosphatase 145 (H) 39 - 117 U/L   Total Bilirubin 1.7 (H) 0.3 - 1.2 mg/dL   GFR calc non Af Amer 57 (L) >90 mL/min   GFR calc Af Amer 66 (L) >90 mL/min    Comment: (NOTE) The eGFR has been calculated using the CKD EPI equation. This calculation has not been validated in all clinical situations. eGFR's persistently <90 mL/min signify possible Chronic Kidney Disease.    Anion gap 15 5 - 15  CBC     Status: Abnormal   Collection Time: 09/06/14 12:32 AM  Result Value Ref Range   WBC 13.3 (H) 4.0 - 10.5 K/uL   RBC 3.53 (L) 4.22 - 5.81 MIL/uL   Hemoglobin 11.1 (L) 13.0 - 17.0 g/dL   HCT 32.3 (L) 39.0 - 52.0 %   MCV 91.5 78.0 - 100.0 fL   MCH 31.4 26.0 - 34.0 pg   MCHC 34.4 30.0 - 36.0 g/dL   RDW 14.1 11.5 - 15.5 %   Platelets 241 150 - 400 K/uL  Pro b natriuretic peptide (BNP)     Status: Abnormal   Collection Time: 09/06/14 12:33 AM  Result Value Ref Range   Pro B Natriuretic peptide (BNP) 1947.0 (H) 0 - 450 pg/mL  Lipid panel     Status: None   Collection Time: 09/06/14 12:33 AM  Result Value Ref Range   Cholesterol 135 0 - 200 mg/dL   Triglycerides 77 <150 mg/dL   HDL 42 >39 mg/dL   Total CHOL/HDL Ratio 3.2 RATIO   VLDL 15 0 - 40 mg/dL   LDL Cholesterol 78 0 - 99 mg/dL    Comment:        Total Cholesterol/HDL:CHD Risk Coronary Heart Disease Risk Table                     Men   Women  1/2 Average Risk   3.4   3.3  Average Risk       5.0  4.4  2 X Average Risk   9.6   7.1  3 X Average Risk  23.4   11.0        Use the calculated Patient Ratio above and the CHD Risk Table to determine the patient's CHD Risk.        ATP III CLASSIFICATION (LDL):  <100     mg/dL   Optimal  100-129  mg/dL   Near or Above                    Optimal  130-159  mg/dL   Borderline  160-189  mg/dL   High  >190      mg/dL   Very High Performed at [facility]   Lipase, blood     Status: Abnormal   Collection Time: 09/06/14 12:34 AM  Result Value Ref Range   Lipase >3000 (H) 11 - 59 U/L  Glucose, capillary     Status: Abnormal   Collection Time: 09/06/14  8:01 AM  Result Value Ref Range   Glucose-Capillary 127 (H) 70 - 99 mg/dL  Glucose, capillary     Status: None   Collection Time: 09/06/14 11:55 AM  Result Value Ref Range   Glucose-Capillary 77 70 - 99 mg/dL  Glucose, capillary     Status: Abnormal   Collection Time: 09/06/14  4:08 PM  Result Value Ref Range   Glucose-Capillary 113 (H) 70 - 99 mg/dL    IMAGING: Ct Abdomen Pelvis W Contrast  09/06/2014   CLINICAL DATA:  Left upper abdominal and right abdominal pain associated with nausea and vomiting. History of gallbladder problems. History of colon cancer and diabetes.  EXAM: CT ABDOMEN AND PELVIS WITH CONTRAST  TECHNIQUE: Multidetector CT imaging of the abdomen and pelvis was performed using the standard protocol following bolus administration of intravenous contrast.  CONTRAST:  176mL OMNIPAQUE IOHEXOL 300 MG/ML  SOLN  COMPARISON:  11/23/2013  FINDINGS: Mild cardiac enlargement. Coronary artery calcification. Infiltration in both lung bases with bronchial wall thickening suggesting bronchitis.  Gallbladder is distended with mild gallbladder wall thickening. Small stones in the gallbladder. No infiltration in the pericholecystic fat. No bile duct dilatation. Similar appearance to previous study. The liver, spleen, adrenal glands, inferior vena cava, and retroperitoneal lymph nodes are unremarkable. Calcification of abdominal aorta and branch vessels without aneurysm. Multiple renal cysts and parenchymal atrophy. No hydronephrosis. Pancreas is unremarkable with improved inflammatory change since previous study. No residual pseudo cysts. Small esophageal hiatal hernia. Stomach is otherwise unremarkable. Small bowel appear normal. Stool-filled  colon without distention or wall thickening. No free air or free fluid in the abdomen.  Pelvis: Appendix is probably surgically absent. Bladder wall is not thickened. Small bladder wall calcifications are present. Prostate gland is enlarged with calcification. Diverticulosis of sigmoid colon without evidence of diverticulitis. No free or loculated pelvic fluid collections. No pelvic mass or lymphadenopathy diffuse bone demineralization and degenerative changes. Multiple vertebral compression fractures consistent with osteoporosis. Appearance is unchanged since prior study.  IMPRESSION: Infiltration and bronchial wall thickening in the lung bases suggesting bronchitis. Distended gallbladder with cholelithiasis and mild gallbladder wall thickening, similar to prior study. Enlarged prostate gland. Changes of osteoporosis with multiple vertebral compression fractures.   Electronically Signed   By: Lucienne Capers M.D.   On: 09/06/2014 00:06   Dg Chest Port 1 View  09/05/2014   CLINICAL DATA:  Fever beginning today. Personal history of colon cancer, asthma, hypertension, atrial fibrillation and congestive heart failure.  EXAM: PORTABLE CHEST - 1 VIEW  COMPARISON:  11/23/2013 and previous  FINDINGS: The heart is at the upper limits of normal in size. There is calcification of the aorta. There is mild chronic scarring in the right mid lung and at both lung bases but no sign of active infiltrate, effusion or collapse.  IMPRESSION: Areas of chronic pulmonary scarring.  No active process evident.   Electronically Signed   By: Nelson Chimes M.D.   On: 09/05/2014 22:41   US Abdomen Limited Ruq  09/06/2014   CLINICAL DATA:  Epigastric abdominal pain. Elevated serum transaminases, hyperbilirubinemia, and elevated serum alkaline phosphatase. Prior history of colon cancer.  EXAM: US ABDOMEN LIMITED - RIGHT UPPER QUADRANT  COMPARISON:  CT abdomen and pelvis yesterday.  FINDINGS: Gallbladder:  Multiple small shadowing  gallstones and moderate amount of echogenic sludge. Largest gallstone on the order of 7 mm. Gallbladder wall thickening up to approximately 6 mm. No pericholecystic fluid. Negative sonographic Murphy sign according to the ultrasound technologist.  Common bile duct:  Diameter: Approximately 7 mm.  No visible bile duct stones. Distal duct obscured by duodenum bowel gas.  Liver:  Normal size and echotexture without focal parenchymal abnormality. Patent portal vein with hepatopetal flow.  IMPRESSION: 1. Cholelithiasis, gallbladder sludge, and gallbladder wall thickening. Negative sonographic Murphy sign. Chronic cholecystitis is suspected. 2. Upper normal caliber common bile duct. While no bile duct stone was visible on the current examination, review of yesterday's CT suggests that there may be sludge in the distal common bile duct. 3. Normal sonographic appearance of the liver.   Electronically Signed   By: Evangeline Dakin M.D.   On: 09/06/2014 09:45    HOSPITAL DIAGNOSES: Principal Problem:   Gallstone pancreatitis Active Problems:   HLD (hyperlipidemia)   Essential hypertension   Asthma   Atrial fibrillation   CHF (congestive heart failure)   Hypothyroidism   IMPRESSION: 1. High risk patient for high risk (likely open intraperitoneal) surgery - estimate 3-5% risk of perioperative MI, arrythmia, heart failure exacerbation or death due to cardiac cause  RECOMMENDATION: 1. "Unfortunately, Mr. Chavarria would be at high risk for surgery. If it is likely he would require an open cholecystecomy, that risk would be higher. While his LV function had recovered, presumably from a-fib, he did have NSTEMI and there is high likelihood of underlying CAD.  He is not active enough to assess for coronary ischemia. A stress test, if abnormal, may lead to catheterization, which he is not a good candidate for at this time. Stenting would delay surgery for a year and he would not be a CABG candidate if he had multivessel  CAD (higher likelihood), therefore, I would not recommend stress testing.  His best option may be ERCP/sphincterotomy, which would be at least an intermediate risk procedure or just bowel rest, fluids, antibiotics and pain medications. While bridging with heparin for a-fib is reasonable in patients with prior cardioembolic stroke (he has had) and high CHADSVASC score of 5-6, it must be weighed against possible bleeding risk in the setting of acute pancreatitis, especially if there is pancreatic necrosis. Therefore, it may not be unreasonable to consider stopping anticoagulation completely. Ultimately, will need to reconsider his candidacy for long-term anticoagulation after he recovers, anyhow.  Cardiology will be available as needed. Thanks for the consult.  Time Spent Directly with Patient: 45 minutes  Pixie Casino, MD, [facility] Attending Cardiologist [facility]  HILTY,Kenneth C 09/06/2014, 4:23 PM

## 2014-09-06 NOTE — Care Management Note (Addendum)
    Page 1 of 2   09/09/2014     4:18:59 PM CARE MANAGEMENT NOTE 09/09/2014  Patient:  Todd [name], Todd [name]   Account Number:  [id number]  Date Initiated:  09/06/2014  Documentation initiated by:  Dessa Phi  Subjective/Objective Assessment:   80 Y/O M ADMITTED W/ABD PAIN,N/V.GALLSTONE PANCREATITIS.     Action/Plan:   FROM HOME.SPOUSE IN SNF.   Anticipated DC Date:  09/10/2014   Anticipated DC Plan:  [place]  CM consult  Patient refused services      Choice offered to / List presented to:  C-1 Patient        [place] arranged  HH-1 RN  [place].   Status of service:  In process, will continue to follow Medicare Important Message given?  YES (If response is "NO", the following Medicare IM given date fields will be blank) Date Medicare IM given:  09/09/2014 Medicare IM given by:  [facility] Date Additional Medicare IM given:   Additional Medicare IM given by:    Discharge Disposition:    Per UR Regulation:  Reviewed for med. necessity/level of care/duration of stay  If discussed at [place] of Stay Meetings, dates discussed:    Comments:  09/09/14 Taren Dymek RN,BSN NCM [address] [place]. PT-SNF.SPOKE TO PATIENT IN RM,ALSO CALLED [facility] C#[phone]-THEY BOTH DECLINED SNF, WANT HOME W/HHC-[facility] CHOSEN RECOMMEND HHRN/PT/OT/AIDE/SW.EXPLAINED SNF SERVICES(24HR STRUCTURED CARE IN A FACILITY) VS HHC-(INTERMITTENT SKILLED SERVICES),INFORMED OF PRIVATE DUTY CARE(OUT OF POCKET EXPENSE,INDEPENDANT DECISION.PATIENT/DTR VOICED UNDERSTANDING. THEY ALREADY HAVE PRIVATE SITTERS @ HOME-THEY WILL INCREASE THE HOURS TO 24HRS/MON-SUNDAY PER DTR.DTR SAYS PATIENT'S SPOUSE WILL BE D/C FROM [facility] IN AM TO HOME.THEY PPLAN FOR 24HR PRIVATE DUTY FOR BOTH IN THE [place] WILL PROVIDE OWN TRANSP HOME.CSW NOTIFIED.AWAIT FINAL HHC  ORDERS.[facility] CHOSEN FOR HHC-TC [facility] REP AWARE OF REFERRAL.GI/SX FOLLOWING.  09/06/14 Zafar Debrosse RN,BSN NCM [phone] GI FOLLOWING.AWAIT PT/OT RECOMMENDATIONS.

## 2014-09-06 NOTE — Evaluation (Signed)
Physical Therapy Evaluation Patient Details Name: Todd [name] MRN: [id number] DOB: Nov 10, 1923 Today's Date: 09/06/2014   History of Present Illness  80 y.o. male with past medical history of diabetes type 2, atrial fibrillation on Xarelto, asthma, congestive heart failure, hypertension, hyperlipidemia, hypothyroidism, admitted with nausea, vomiting, and abdominal pain due to Gallstone pancreatitis .  Clinical Impression  Pt currently with functional limitations due to the deficits listed below (see PT Problem List).  Pt will benefit from skilled PT to increase their independence and safety with mobility to allow discharge to the venue listed below.  Pt endorses weakness and felt unable to tolerate ambulation at this time due to weakness and abdominal pain.  Pt poor historian at this time.  Recommend SNF however will continue to follow and update d/c recommendations.     Follow Up Recommendations SNF;Supervision/Assistance - 24 hour    Equipment Recommendations  None recommended by PT    Recommendations for Other Services       Precautions / Restrictions Precautions Precautions: Fall      Mobility  Bed Mobility Overal bed mobility: Needs Assistance Bed Mobility: Supine to Sit;Sit to Supine     Supine to sit: Min assist Sit to supine: Min guard   General bed mobility comments: assist for trunk upright  Transfers Overall transfer level: Needs assistance Equipment used: None Transfers: Sit to/from Stand Sit to Stand: Mod assist         General transfer comment: verbal cues to use rail to assist, unable to tolerate standing < 1 min due to abdominal pain and weakness per pt  Ambulation/Gait                Stairs            Wheelchair Mobility    Modified Rankin (Stroke Patients Only)       Balance                                             Pertinent Vitals/Pain Pain Assessment: Faces Faces Pain Scale: Hurts even more Pain  Location: abdomen Pain Descriptors / Indicators: Grimacing;Guarding Pain Intervention(s): Monitored during session;Limited activity within patient's tolerance;Repositioned    Home Living Family/patient expects to be discharged to:: Private residence Living Arrangements:  (pt states he lives with daughter)   Type of Home: House       Home Layout: One level Home Equipment: Environmental consultant - 2 wheels;Cane - single point Additional Comments: pt perseverating on drinking water however NPO at this time    Prior Function Level of Independence: Independent with assistive device(s)         Comments: pt states he uses "walking stick"     Hand Dominance        Extremity/Trunk Assessment               Lower Extremity Assessment: Generalized weakness         Communication   Communication: HOH  Cognition Arousal/Alertness: Awake/alert Behavior During Therapy: WFL for tasks assessed/performed Overall Cognitive Status: No family/caregiver present to determine baseline cognitive functioning (appears a little confused)                      General Comments      Exercises        Assessment/Plan    PT Assessment Patient needs continued PT services  PT Diagnosis Difficulty walking;Generalized weakness   PT Problem List Decreased strength;Decreased activity tolerance;Decreased balance;Decreased mobility  PT Treatment Interventions DME instruction;Gait training;Functional mobility training;Therapeutic activities;Therapeutic exercise;Patient/family education   PT Goals (Current goals can be found in the Care Plan section) Acute Rehab PT Goals PT Goal Formulation: With patient Time For Goal Achievement: 09/20/14 Potential to Achieve Goals: Good    Frequency Min 3X/week   Barriers to discharge        Co-evaluation               End of Session   Activity Tolerance: Patient limited by pain;Patient limited by fatigue Patient left: in bed;with call bell/phone  within reach;with bed alarm set           Time: 0051-1021 PT Time Calculation (min): 11 min   Charges:   PT Evaluation $Initial PT Evaluation Tier I: 1 Procedure PT Treatments $Therapeutic Activity: 8-22 mins   PT G Codes:          [name],KATHrine E 09/06/2014, 3:24 PM Carmelia Bake, PT, DPT 09/06/2014 Pager: [phone]

## 2014-09-06 NOTE — Plan of Care (Signed)
Problem: Phase III Progression Outcomes Goal: Tolerating diet Outcome: Completed/Met Date Met:  09/06/14     

## 2014-09-06 NOTE — Consult Note (Signed)
[facility] [birth year]  [id number].   Primary Cardiologist: Dr. Dorris Carnes Requesting MD: Dr. Zenovia Jarred Chief Complaint/Reason for Consult: gallstone pancreatitis HPI: this is an 80 yo white male with multiple medical problems who has been admitted for his secondary episode of gallstone pancreatitis.  His first episode was in January, but he refused surgery at that time.  He began having epigastric abdominal pain again yesterday with nausea and vomiting.  He was brought to the hospital and found to have elevated LFTs and lipase >3000.  He does have gallstones on [place].  He was admitted and GI is seeing him.  We have been asked to see him again for another consideration of surgical intervention.  A detailed history is unable to be fully obtained as they patient is so hard of hearing that he can not carry a good conversation.  ROS: Please see HPI, otherwise negative  Family History  Problem Relation Age of Onset  . Hyperlipidemia Father   . Hypertension Father   . Heart disease Father   . Asthma Father   . Stroke Father   . Heart failure Father   . Heart disease Mother   . Heart failure Mother   . Heart attack Sister     Past Medical History  Diagnosis Date  . Asthma   . Hypertension   . Diabetes mellitus   . Cancer   . A-fib   . Hyperlipidemia   . Colon cancer   . Shortness of breath   . CHF (congestive heart failure) 10/28/2011  . Aortic stenosis     Mild  . Forgetfulness   . PVC's (premature ventricular contractions)   . Mild aortic insufficiency     Past Surgical History  Procedure Laterality Date  . Colon surgery      Partial hemecolectomy   . Central line insertion  09/08/2011         Social History:  reports that he has quit smoking. He does not have any smokeless tobacco history on file. He reports that he does not drink alcohol or use illicit drugs.  Allergies: No Known Allergies  Medications Prior to Admission  Medication Sig Dispense Refill  .  amLODipine (NORVASC) 5 MG tablet Take 5 mg by mouth daily.    . ferrous sulfate 325 (65 FE) MG tablet Take 325 mg by mouth at bedtime.    . fluticasone (FLONASE) 50 MCG/ACT nasal spray Place 2 sprays into both nostrils daily.    . fluticasone (FLONASE) 50 MCG/ACT nasal spray Place 2 sprays into both nostrils daily.    . Fluticasone-Salmeterol (ADVAIR) 500-50 MCG/DOSE AEPB Inhale 1 puff into the lungs 2 (two) times daily.    . Fluticasone-Salmeterol (ADVAIR) 500-50 MCG/DOSE AEPB Inhale 1 puff into the lungs 2 (two) times daily.    . furosemide (LASIX) 20 MG tablet Take 20 mg by mouth daily.    Marland Kitchen levothyroxine (SYNTHROID, LEVOTHROID) 25 MCG tablet Take 25 mcg by mouth daily.    Marland Kitchen losartan (COZAAR) 25 MG tablet Take 25 mg by mouth daily.    Marland Kitchen omeprazole (PRILOSEC) 40 MG capsule Take 40 mg by mouth daily.    . potassium chloride SA (K-DUR,KLOR-CON) 20 MEQ tablet Take 10 mEq by mouth 2 (two) times daily.    . pravastatin (PRAVACHOL) 20 MG tablet Take 20 mg by mouth daily.    . rivaroxaban (XARELTO) 20 MG TABS tablet Take 20 mg by mouth daily with supper.      Blood pressure 108/47,  pulse 42, temperature 99.1 F (37.3 C), temperature source Oral, resp. rate 20, height 6' (1.829 m), weight 160 lb 15 oz (73 kg), SpO2 98 %. Physical Exam: General: pleasant, WD, WN white male who is laying in bed in NAD HEENT: head is normocephalic, atraumatic.  Sclera are noninjected.  PERRL.  Ears and nose without any masses or lesions.  Mouth is pink and moist Heart: irregularly irregular.  Normal s1,s2. No obvious murmurs, gallops, or rubs noted.  Palpable radial and pedal pulses bilaterally Lungs: CTAB, no wheezes, rhonchi, or rales noted.  Respiratory effort nonlabored Abd: soft, mild epigastric abdominal tenderness, ND, +BS, no masses, hernias, or organomegaly MS: all 4 extremities are symmetrical with no cyanosis, clubbing, or edema. Skin: warm and dry with no masses, lesions, or rashes Psych: A&Ox3 with an  appropriate affect.    Results for orders placed or performed during the hospital encounter of 09/05/14 (from the past 48 hour(s))  Urinalysis, Routine w reflex microscopic     Status: Abnormal   Collection Time: 09/05/14  9:21 PM  Result Value Ref Range   Color, Urine YELLOW YELLOW   APPearance TURBID (A) CLEAR   Specific Gravity, Urine 1.014 1.005 - 1.030   pH 6.0 5.0 - 8.0   Glucose, UA 100 (A) NEGATIVE mg/dL   Hgb urine dipstick TRACE (A) NEGATIVE   Bilirubin Urine NEGATIVE NEGATIVE   Ketones, ur NEGATIVE NEGATIVE mg/dL   Protein, ur NEGATIVE NEGATIVE mg/dL   Urobilinogen, UA 1.0 0.0 - 1.0 mg/dL   Nitrite NEGATIVE NEGATIVE   Leukocytes, UA TRACE (A) NEGATIVE  Urine microscopic-add on     Status: None   Collection Time: 09/05/14  9:21 PM  Result Value Ref Range   WBC, UA 3-6 <3 WBC/hpf   Bacteria, UA RARE RARE  CBC with Differential     Status: Abnormal   Collection Time: 09/05/14  9:34 PM  Result Value Ref Range   WBC 12.1 (H) 4.0 - 10.5 K/uL   RBC 3.85 (L) 4.22 - 5.81 MIL/uL   Hemoglobin 12.0 (L) 13.0 - 17.0 g/dL   HCT 35.5 (L) 39.0 - 52.0 %   MCV 92.2 78.0 - 100.0 fL   MCH 31.2 26.0 - 34.0 pg   MCHC 33.8 30.0 - 36.0 g/dL   RDW 14.0 11.5 - 15.5 %   Platelets 255 150 - 400 K/uL   Neutrophils Relative % 89 (H) 43 - 77 %   Neutro Abs 10.7 (H) 1.7 - 7.7 K/uL   Lymphocytes Relative 2 (L) 12 - 46 %   Lymphs Abs 0.3 (L) 0.7 - 4.0 K/uL   Monocytes Relative 9 3 - 12 %   Monocytes Absolute 1.1 (H) 0.1 - 1.0 K/uL   Eosinophils Relative 0 0 - 5 %   Eosinophils Absolute 0.0 0.0 - 0.7 K/uL   Basophils Relative 0 0 - 1 %   Basophils Absolute 0.0 0.0 - 0.1 K/uL  Comprehensive metabolic panel     Status: Abnormal   Collection Time: 09/05/14  9:34 PM  Result Value Ref Range   Sodium 139 137 - 147 mEq/L   Potassium 4.2 3.7 - 5.3 mEq/L   Chloride 101 96 - 112 mEq/L   CO2 22 19 - 32 mEq/L   Glucose, Bld 162 (H) 70 - 99 mg/dL   BUN 28 (H) 6 - 23 mg/dL   Creatinine, Ser 1.16  0.50 - 1.35 mg/dL   Calcium 9.6 8.4 - 10.5 mg/dL   Total Protein 7.3  6.0 - 8.3 g/dL   Albumin 3.8 3.5 - 5.2 g/dL   AST 272 (H) 0 - 37 U/L   ALT 176 (H) 0 - 53 U/L   Alkaline Phosphatase 156 (H) 39 - 117 U/L   Total Bilirubin 1.3 (H) 0.3 - 1.2 mg/dL   GFR calc non Af Amer 54 (L) >90 mL/min   GFR calc Af Amer 63 (L) >90 mL/min    Comment: (NOTE) The eGFR has been calculated using the CKD EPI equation. This calculation has not been validated in all clinical situations. eGFR's persistently <90 mL/min signify possible Chronic Kidney Disease.    Anion gap 16 (H) 5 - 15  Lipase, blood     Status: Abnormal   Collection Time: 09/05/14  9:34 PM  Result Value Ref Range   Lipase >3000 (H) 11 - 59 U/L  I-stat troponin, ED (only if pt is 78 y.o. or older & pain is above umbilicus) - do not order at [facility]     Status: None   Collection Time: 09/05/14  9:45 PM  Result Value Ref Range   Troponin i, poc 0.00 0.00 - 0.08 ng/mL   Comment 3            Comment: Due to the release kinetics of cTnI, a negative result within the first hours of the onset of symptoms does not rule out myocardial infarction with certainty. If myocardial infarction is still suspected, repeat the test at appropriate intervals.   I-Stat CG4 Lactic Acid, ED     Status: None   Collection Time: 09/05/14  9:46 PM  Result Value Ref Range   Lactic Acid, Venous 2.08 0.5 - 2.2 mmol/L  Hemoglobin A1c     Status: Abnormal   Collection Time: 09/06/14 12:32 AM  Result Value Ref Range   Hgb A1c MFr Bld 6.1 (H) <5.7 %    Comment: (NOTE)                                                                       According to the ADA Clinical Practice Recommendations for 2011, when HbA1c is used as a screening test:  >=6.5%   Diagnostic of Diabetes Mellitus           (if abnormal result is confirmed) 5.7-6.4%   Increased risk of developing Diabetes Mellitus References:Diagnosis and Classification of Diabetes  [place],Diabetes HYQM,5784,69(GEXBM 1):S62-S69 and Standards of Medical Care in         Diabetes - 2011,Diabetes Care,2011,34 (Suppl 1):S11-S61.    Mean Plasma Glucose 128 (H) <117 mg/dL    Comment: Performed at [facility]     Status: Abnormal   Collection Time: 09/06/14 12:32 AM  Result Value Ref Range   Prothrombin Time 19.1 (H) 11.6 - 15.2 seconds   INR 1.59 (H) 0.00 - 1.49  APTT     Status: None   Collection Time: 09/06/14 12:32 AM  Result Value Ref Range   aPTT 34 24 - 37 seconds  Comprehensive metabolic panel     Status: Abnormal   Collection Time: 09/06/14 12:32 AM  Result Value Ref Range   Sodium 138 137 - 147 mEq/L   Potassium 4.2 3.7 - 5.3 mEq/L   Chloride 101  96 - 112 mEq/L   CO2 22 19 - 32 mEq/L   Glucose, Bld 171 (H) 70 - 99 mg/dL   BUN 26 (H) 6 - 23 mg/dL   Creatinine, Ser 1.11 0.50 - 1.35 mg/dL   Calcium 8.8 8.4 - 10.5 mg/dL   Total Protein 6.6 6.0 - 8.3 g/dL   Albumin 3.5 3.5 - 5.2 g/dL   AST 292 (H) 0 - 37 U/L   ALT 212 (H) 0 - 53 U/L   Alkaline Phosphatase 145 (H) 39 - 117 U/L   Total Bilirubin 1.7 (H) 0.3 - 1.2 mg/dL   GFR calc non Af Amer 57 (L) >90 mL/min   GFR calc Af Amer 66 (L) >90 mL/min    Comment: (NOTE) The eGFR has been calculated using the CKD EPI equation. This calculation has not been validated in all clinical situations. eGFR's persistently <90 mL/min signify possible Chronic Kidney Disease.    Anion gap 15 5 - 15  CBC     Status: Abnormal   Collection Time: 09/06/14 12:32 AM  Result Value Ref Range   WBC 13.3 (H) 4.0 - 10.5 K/uL   RBC 3.53 (L) 4.22 - 5.81 MIL/uL   Hemoglobin 11.1 (L) 13.0 - 17.0 g/dL   HCT 32.3 (L) 39.0 - 52.0 %   MCV 91.5 78.0 - 100.0 fL   MCH 31.4 26.0 - 34.0 pg   MCHC 34.4 30.0 - 36.0 g/dL   RDW 14.1 11.5 - 15.5 %   Platelets 241 150 - 400 K/uL  Pro b natriuretic peptide (BNP)     Status: Abnormal   Collection Time: 09/06/14 12:33 AM  Result Value Ref Range   Pro B Natriuretic peptide  (BNP) 1947.0 (H) 0 - 450 pg/mL  Lipid panel     Status: None   Collection Time: 09/06/14 12:33 AM  Result Value Ref Range   Cholesterol 135 0 - 200 mg/dL   Triglycerides 77 <150 mg/dL   HDL 42 >39 mg/dL   Total CHOL/HDL Ratio 3.2 RATIO   VLDL 15 0 - 40 mg/dL   LDL Cholesterol 78 0 - 99 mg/dL    Comment:        Total Cholesterol/HDL:CHD Risk Coronary Heart Disease Risk Table                     Men   Women  1/2 Average Risk   3.4   3.3  Average Risk       5.0   4.4  2 X Average Risk   9.6   7.1  3 X Average Risk  23.4   11.0        Use the calculated Patient Ratio above and the CHD Risk Table to determine the patient's CHD Risk.        ATP III CLASSIFICATION (LDL):  <100     mg/dL   Optimal  100-129  mg/dL   Near or Above                    Optimal  130-159  mg/dL   Borderline  160-189  mg/dL   High  >190     mg/dL   Very High Performed at [facility]   Lipase, blood     Status: Abnormal   Collection Time: 09/06/14 12:34 AM  Result Value Ref Range   Lipase >3000 (H) 11 - 59 U/L  Glucose, capillary     Status: Abnormal   Collection  Time: 09/06/14  8:01 AM  Result Value Ref Range   Glucose-Capillary 127 (H) 70 - 99 mg/dL  Glucose, capillary     Status: None   Collection Time: 09/06/14 11:55 AM  Result Value Ref Range   Glucose-Capillary 77 70 - 99 mg/dL   Ct Abdomen Pelvis W Contrast  09/06/2014   CLINICAL DATA:  Left upper abdominal and right abdominal pain associated with nausea and vomiting. History of gallbladder problems. History of colon cancer and diabetes.  EXAM: CT ABDOMEN AND PELVIS WITH CONTRAST  TECHNIQUE: Multidetector CT imaging of the abdomen and pelvis was performed using the standard protocol following bolus administration of intravenous contrast.  CONTRAST:  OMNIPAQUE IOHEXOL 300 MG/ML  SOLN  COMPARISON:  11/23/2013  FINDINGS: Mild cardiac enlargement. Coronary artery calcification. Infiltration in both lung bases with bronchial wall  thickening suggesting bronchitis.  Gallbladder is distended with mild gallbladder wall thickening. Small stones in the gallbladder. No infiltration in the pericholecystic fat. No bile duct dilatation. Similar appearance to previous study. The liver, spleen, adrenal glands, inferior vena cava, and retroperitoneal lymph nodes are unremarkable. Calcification of abdominal aorta and branch vessels without aneurysm. Multiple renal cysts and parenchymal atrophy. No hydronephrosis. Pancreas is unremarkable with improved inflammatory change since previous study. No residual pseudo cysts. Small esophageal hiatal hernia. Stomach is otherwise unremarkable. Small bowel appear normal. Stool-filled colon without distention or wall thickening. No free air or free fluid in the abdomen.  Pelvis: Appendix is probably surgically absent. Bladder wall is not thickened. Small bladder wall calcifications are present. Prostate gland is enlarged with calcification. Diverticulosis of sigmoid colon without evidence of diverticulitis. No free or loculated pelvic fluid collections. No pelvic mass or lymphadenopathy diffuse bone demineralization and degenerative changes. Multiple vertebral compression fractures consistent with osteoporosis. Appearance is unchanged since prior study.  IMPRESSION: Infiltration and bronchial wall thickening in the lung bases suggesting bronchitis. Distended gallbladder with cholelithiasis and mild gallbladder wall thickening, similar to prior study. Enlarged prostate gland. Changes of osteoporosis with multiple vertebral compression fractures.   Electronically Signed   By: Burman Nieves M.D.   On: 09/06/2014 00:06   Dg Chest Port 1 View  09/05/2014   CLINICAL DATA:  Fever beginning today. Personal history of colon cancer, asthma, hypertension, atrial fibrillation and congestive heart failure.  EXAM: PORTABLE CHEST - 1 VIEW  COMPARISON:  11/23/2013 and previous  FINDINGS: The heart is at the upper limits of  normal in size. There is calcification of the aorta. There is mild chronic scarring in the right mid lung and at both lung bases but no sign of active infiltrate, effusion or collapse.  IMPRESSION: Areas of chronic pulmonary scarring.  No active process evident.   Electronically Signed   By: Paulina Fusi M.D.   On: 09/05/2014 22:41   US Abdomen Limited Ruq  09/06/2014   CLINICAL DATA:  Epigastric abdominal pain. Elevated serum transaminases, hyperbilirubinemia, and elevated serum alkaline phosphatase. Prior history of colon cancer.  EXAM: US ABDOMEN LIMITED - RIGHT UPPER QUADRANT  COMPARISON:  CT abdomen and pelvis yesterday.  FINDINGS: Gallbladder:  Multiple small shadowing gallstones and moderate amount of echogenic sludge. Largest gallstone on the order of 7 mm. Gallbladder wall thickening up to approximately 6 mm. No pericholecystic fluid. Negative sonographic Murphy sign according to the ultrasound technologist.  Common bile duct:  Diameter: Approximately 7 mm.  No visible bile duct stones. Distal duct obscured by duodenum bowel gas.  Liver:  Normal size and echotexture without  focal parenchymal abnormality. Patent portal vein with hepatopetal flow.  IMPRESSION: 1. Cholelithiasis, gallbladder sludge, and gallbladder wall thickening. Negative sonographic Murphy sign. Chronic cholecystitis is suspected. 2. Upper normal caliber common bile duct. While no bile duct stone was visible on the current examination, review of yesterday's CT suggests that there may be sludge in the distal common bile duct. 3. Normal sonographic appearance of the liver.   Electronically Signed   By: Evangeline Dakin M.D.   On: 09/06/2014 09:45       Assessment/Plan 1. Gallstone pancreatitis 2. A fib on xarelto 3. H/o CVA (felt likely secondary to #2) 4. Aortic stenosis 5. History of colon CA 6. CHF 7. HTN 8. DM  Plan: 1. I had a very long discussion with Neoma Laming, the patient's daughter, about this situation.  The patient  now with his second episode of gallstone pancreatitis is at risk for chronic pancreatitis and/or other pancreatic issues if he continues to get repeated episodes of pancreatitis.  However, he is 80 yo and has multiple medical issues along with a history of a upper transverse abdominal incision from colon cancer.  This would likely put him at higher risk for an open cholecystectomy.  He needs a cardiology consult to gauge risk with possible surgery and if his xarelto can be held (currently being placed on a heparin drip while in the hospital) to help the daughter make a decision whether her dad may be able to undergo an operation.  His lipase is still over 3000.  This would need to normalize along with xarelto being held for at least 3 days prior to any type of surgery.  This gives the daughter a chance to talk to cardiology as well as her dad to make the best decision whether they are willing to proceed with an operation versus not.  The daughter is hesitant right now to think her dad can tolerate this, but is willing to look into all possibilities.  She seems very reasonable and she and her dad have time to make the best decision for him.  We will follow along.   Chloe Bluett E 09/06/2014, 3:40 PM Pager: [phone]

## 2014-09-07 ENCOUNTER — Inpatient Hospital Stay (HOSPITAL_COMMUNITY): Payer: Medicare Other

## 2014-09-07 DIAGNOSIS — R945 Abnormal results of liver function studies: Secondary | ICD-10-CM

## 2014-09-07 DIAGNOSIS — R7989 Other specified abnormal findings of blood chemistry: Secondary | ICD-10-CM | POA: Diagnosis present

## 2014-09-07 DIAGNOSIS — K851 Biliary acute pancreatitis: Principal | ICD-10-CM

## 2014-09-07 LAB — CBC
HCT: 30.1 % — ABNORMAL LOW (ref 39.0–52.0)
Hemoglobin: 10.3 g/dL — ABNORMAL LOW (ref 13.0–17.0)
MCH: 31.7 pg (ref 26.0–34.0)
MCHC: 34.2 g/dL (ref 30.0–36.0)
MCV: 92.6 fL (ref 78.0–100.0)
Platelets: 205 10*3/uL (ref 150–400)
RBC: 3.25 MIL/uL — ABNORMAL LOW (ref 4.22–5.81)
RDW: 14.9 % (ref 11.5–15.5)
WBC: 11.5 10*3/uL — AB (ref 4.0–10.5)

## 2014-09-07 LAB — COMPREHENSIVE METABOLIC PANEL
ALBUMIN: 2.7 g/dL — AB (ref 3.5–5.2)
ALK PHOS: 124 U/L — AB (ref 39–117)
ALT: 164 U/L — ABNORMAL HIGH (ref 0–53)
AST: 119 U/L — AB (ref 0–37)
Anion gap: 11 (ref 5–15)
BUN: 23 mg/dL (ref 6–23)
CHLORIDE: 104 meq/L (ref 96–112)
CO2: 23 mEq/L (ref 19–32)
Calcium: 8.6 mg/dL (ref 8.4–10.5)
Creatinine, Ser: 1.15 mg/dL (ref 0.50–1.35)
GFR calc Af Amer: 64 mL/min — ABNORMAL LOW (ref >90)
GFR calc non Af Amer: 55 mL/min — ABNORMAL LOW (ref >90)
Glucose, Bld: 122 mg/dL — ABNORMAL HIGH (ref 70–99)
POTASSIUM: 3.6 meq/L — AB (ref 3.7–5.3)
Sodium: 138 mEq/L (ref 137–147)
Total Bilirubin: 3.9 mg/dL — ABNORMAL HIGH (ref 0.3–1.2)
Total Protein: 5.8 g/dL — ABNORMAL LOW (ref 6.0–8.3)

## 2014-09-07 LAB — GLUCOSE, CAPILLARY
GLUCOSE-CAPILLARY: 102 mg/dL — AB (ref 70–99)
GLUCOSE-CAPILLARY: 94 mg/dL (ref 70–99)
Glucose-Capillary: 120 mg/dL — ABNORMAL HIGH (ref 70–99)
Glucose-Capillary: 98 mg/dL (ref 70–99)

## 2014-09-07 LAB — LIPASE, BLOOD: Lipase: 203 U/L — ABNORMAL HIGH (ref 11–59)

## 2014-09-07 MED ORDER — TRAZODONE HCL 50 MG PO TABS: 25.0000 mg | ORAL_TABLET | Freq: Every evening | ORAL | Status: DC | PRN

## 2014-09-07 NOTE — Progress Notes (Signed)
M.Lynch, NP notified that the  MRI of ABD results are in the computer for her to review.

## 2014-09-07 NOTE — Progress Notes (Signed)
[facility] Progress Note  Subjective:  Denies pain.  Says that he feels fine.  Temp of 99.1 this AM.  Objective:  Vital signs in last 24 hours: Temp:  [98.5 F (36.9 C)-99.1 F (37.3 C)] 99.1 F (37.3 C) (11/07 0516) Pulse Rate:  [42-69] 63 (11/07 0516) Resp:  [20] 20 (11/07 0516) BP: (105-133)/(37-57) 105/37 mmHg (11/07 0516) SpO2:  [96 %-99 %] 99 % (11/07 0516) Weight:  [173 lb 14.4 oz (78.881 kg)] 173 lb 14.4 oz (78.881 kg) (11/07 0516) Last BM Date: 09/05/14 General:  Alert, elderly and frail, in NAD Heart:  Regular rate and rhythm Pulm:  CTAB.  No W/R/R. Abdomen:  Soft, non-distended.  BS present.  Mild epigastric TTP without R/R/G. Extremities:  Without edema. Neurologic:  Alert and oriented;  grossly normal neurologically.  Intake/Output from previous day: 11/06 0701 - 11/07 0700 In: 1978.9 [I.V.:1828.9; IV Piggyback:150] Out: 1175 [Urine:1175] Intake/Output this shift: Total I/O In: 60 [P.O.:60] Out: -   Lab Results:  Recent Labs  09/05/14 2134 09/06/14 0032 09/07/14 0500  WBC 12.1* 13.3* 11.5*  HGB 12.0* 11.1* 10.3*  HCT 35.5* 32.3* 30.1*  PLT 255 241 205   BMET  Recent Labs  09/05/14 2134 09/06/14 0032  NA 139 138  K 4.2 4.2  CL 101 101  CO2 22 22  GLUCOSE 162* 171*  BUN 28* 26*  CREATININE 1.16 1.11  CALCIUM 9.6 8.8   LFT  Recent Labs  09/06/14 0032  PROT 6.6  ALBUMIN 3.5  AST 292*  ALT 212*  ALKPHOS 145*  BILITOT 1.7*   PT/INR  Recent Labs  09/06/14 0032  LABPROT 19.1*  INR 1.59*   Ct Abdomen Pelvis W Contrast  09/06/2014   CLINICAL DATA:  Left upper abdominal and right abdominal pain associated with nausea and vomiting. History of gallbladder problems. History of colon cancer and diabetes.  EXAM: CT ABDOMEN AND PELVIS WITH CONTRAST  TECHNIQUE: Multidetector CT imaging of the abdomen and pelvis was performed using the standard protocol following bolus administration of intravenous contrast.  CONTRAST:   128mL OMNIPAQUE IOHEXOL 300 MG/ML  SOLN  COMPARISON:  11/23/2013  FINDINGS: Mild cardiac enlargement. Coronary artery calcification. Infiltration in both lung bases with bronchial wall thickening suggesting bronchitis.  Gallbladder is distended with mild gallbladder wall thickening. Small stones in the gallbladder. No infiltration in the pericholecystic fat. No bile duct dilatation. Similar appearance to previous study. The liver, spleen, adrenal glands, inferior vena cava, and retroperitoneal lymph nodes are unremarkable. Calcification of abdominal aorta and branch vessels without aneurysm. Multiple renal cysts and parenchymal atrophy. No hydronephrosis. Pancreas is unremarkable with improved inflammatory change since previous study. No residual pseudo cysts. Small esophageal hiatal hernia. Stomach is otherwise unremarkable. Small bowel appear normal. Stool-filled colon without distention or wall thickening. No free air or free fluid in the abdomen.  Pelvis: Appendix is probably surgically absent. Bladder wall is not thickened. Small bladder wall calcifications are present. Prostate gland is enlarged with calcification. Diverticulosis of sigmoid colon without evidence of diverticulitis. No free or loculated pelvic fluid collections. No pelvic mass or lymphadenopathy diffuse bone demineralization and degenerative changes. Multiple vertebral compression fractures consistent with osteoporosis. Appearance is unchanged since prior study.  IMPRESSION: Infiltration and bronchial wall thickening in the lung bases suggesting bronchitis. Distended gallbladder with cholelithiasis and mild gallbladder wall thickening, similar to prior study. Enlarged prostate gland. Changes of osteoporosis with multiple vertebral compression fractures.   Electronically Signed   By: Gwyndolyn Saxon  Gerilyn Nestle M.D.   On: 09/06/2014 00:06   Dg Chest Port 1 View  09/05/2014   CLINICAL DATA:  Fever beginning today. Personal history of colon cancer,  asthma, hypertension, atrial fibrillation and congestive heart failure.  EXAM: PORTABLE CHEST - 1 VIEW  COMPARISON:  11/23/2013 and previous  FINDINGS: The heart is at the upper limits of normal in size. There is calcification of the aorta. There is mild chronic scarring in the right mid lung and at both lung bases but no sign of active infiltrate, effusion or collapse.  IMPRESSION: Areas of chronic pulmonary scarring.  No active process evident.   Electronically Signed   By: Nelson Chimes M.D.   On: 09/05/2014 22:41   US Abdomen Limited Ruq  09/06/2014   CLINICAL DATA:  Epigastric abdominal pain. Elevated serum transaminases, hyperbilirubinemia, and elevated serum alkaline phosphatase. Prior history of colon cancer.  EXAM: US ABDOMEN LIMITED - RIGHT UPPER QUADRANT  COMPARISON:  CT abdomen and pelvis yesterday.  FINDINGS: Gallbladder:  Multiple small shadowing gallstones and moderate amount of echogenic sludge. Largest gallstone on the order of 7 mm. Gallbladder wall thickening up to approximately 6 mm. No pericholecystic fluid. Negative sonographic Murphy sign according to the ultrasound technologist.  Common bile duct:  Diameter: Approximately 7 mm.  No visible bile duct stones. Distal duct obscured by duodenum bowel gas.  Liver:  Normal size and echotexture without focal parenchymal abnormality. Patent portal vein with hepatopetal flow.  IMPRESSION: 1. Cholelithiasis, gallbladder sludge, and gallbladder wall thickening. Negative sonographic Murphy sign. Chronic cholecystitis is suspected. 2. Upper normal caliber common bile duct. While no bile duct stone was visible on the current examination, review of yesterday's CT suggests that there may be sludge in the distal common bile duct. 3. Normal sonographic appearance of the liver.   Electronically Signed   By: Evangeline Dakin M.D.   On: 09/06/2014 09:45    Assessment / Plan: -Gallstone pancreatitis: Second occurrence this year. Refused cholecystectomy in  11/2013. Likely chronic cholecystitis on U/S. CBD upper limits of normal with possible sludge. Met criteria for cholangitis. -Atrial fibrillation: Xarelto on hold currently. -CHF: Elevated BNP on admission.  -Supportive care with NPO status, IVF's, pain control, anti-emetics.  Continue empiric broad spectrum antibiotics. -Trend LFT's and lipase (have just been ordered this morning). -ERCP with sphincterotomy alone seems like like the best option for him in regards to preventing further recurrences of this issues seeing that he is high risk for surgery.  Still likely intermediate risk for ERCP as well.   LOS: 2 days   Bleu Moisan D.  09/07/2014, 9:09 AM  Pager number [phone]

## 2014-09-07 NOTE — Progress Notes (Signed)
Clinical Social Work Department BRIEF PSYCHOSOCIAL ASSESSMENT 09/07/2014  Patient:  Todd [name], Todd [name]     Account Number:  [id number]     Admit date:  09/05/2014  Clinical Social Worker:  Ulyess Blossom  Date/Time:  09/07/2014 02:20 PM  Referred by:  Physician  Date Referred:  09/07/2014 Referred for  SNF Placement   Other Referral:   Interview type:  Patient Other interview type:    PSYCHOSOCIAL DATA Living Status:  WIFE Admitted from facility:   Level of care:   Primary support name:  [name] Primary support relationship to patient:  SPOUSE Degree of support available:   adequate    CURRENT CONCERNS Current Concerns  Post-Acute Placement   Other Concerns:    SOCIAL WORK ASSESSMENT / PLAN CSW received referral for New SNF.    CSW met with pt at bedside. Pt hard of hearing, but appropriately engaged in conversation when able to hear. Pt shared that he lives alone as pt wife currently in rehab, but pt reports pt wife will return from rehab on Tuesday. CSW discussed with pt recommendation for pt to have short term rehab before returning home. Pt declining rehab at this time. Pt shared that he wishes to return home. Pt states that pt wife will be able to assist in his care and that he and his wife have an aide that can come in to assist. CSW inquired with pt if CSW can contact pt daughter to discuss and pt did not provide permission for CSW to contact pt daughter. Pt adamant about returning home.    CSW to continue to follow to be available if pt changes his mind re: SNF placement.   Assessment/plan status:  Psychosocial Support/Ongoing Assessment of Needs Other assessment/ plan:   discharge planning   Information/referral to community resources:   pt declining SNF    PATIENT'S/FAMILY'S RESPONSE TO PLAN OF CARE: Pt alert and oriented x 3. Pt declining SNF at this time. Pt states he wants to return home to pt wife. Pt not agreeable to CSW  contacting pt daughter to discuss.    [name], MSW, [facility] [phone]

## 2014-09-07 NOTE — Progress Notes (Addendum)
[facility]  [address] [place], [place] [address] Phone: [phone] FAX: [phone]    Todd [name] [id number] Apr 15, 1924  CARE TEAM:  PCP: Odette Fraction, MD  Outpatient Care Team: Patient Care Team: Susy Frizzle, MD as PCP - General (Family Medicine)  Inpatient Treatment Team: Treatment Team: Attending Provider: Verlee Monte, MD; Technician: Delila Spence, NT; Registered Nurse: Atilano Median, RN; Rounding Team: Threasa Beards, MD; Technician: Kirstie Peri, NT; Registered Nurse: Stacey Drain, RN; Consulting Physician: Jerene Bears, MD; Consulting Physician: Nolon Nations, MD; Consulting Physician: Michae Kava Lbcardiology, MD; Consulting Physician: Missy Sabins, MD; Registered Nurse: Angus Palms, RN; Technician: Darlyn Chamber, NT; Rounding Team: Jacolyn Reedy, MD   Subjective:  Patient less sore  Objective:  Vital signs:  Filed Vitals:   09/06/14 1929 09/06/14 2045 09/07/14 0516 09/07/14 0913  BP:  133/57 105/37   Pulse:  69 63   Temp:  98.5 F (36.9 C) 99.1 F (37.3 C)   TempSrc:  Oral Oral   Resp:  20 20   Height:      Weight:   173 lb 14.4 oz (78.881 kg)   SpO2: 96% 98% 99% 98%    Last BM Date: 09/05/14  Intake/Output   Yesterday:  11/06 0701 - 11/07 0700 In: 1978.9 [I.V.:1828.9; IV Piggyback:150] Out: 3419 [Urine:1175] This shift:  Total I/O In: 13 [P.O.:60] Out: 40 [Urine:75]  Bowel function:  Flatus: ?  BM: N  Drain: n/a  Physical Exam:  General: Pt awake/alert/oriented x4 in no acute distress Eyes: PERRL, normal EOM.  Sclera clear.  No icterus Neuro: CN II-XII intact w/o focal sensory/motor deficits. Lymph: No head/neck/groin lymphadenopathy Psych:  No delerium/psychosis/paranoia HENT: Normocephalic, Mucus membranes moist.  No thrush.  HOH Neck: Supple, No tracheal deviation Chest: No chest wall pain w good excursion CV:  Pulses intact.  Regular  rhythm MS: Normal AROM mjr joints.  No obvious deformity Abdomen: Soft.  Mod distended.  Min TTP central abdomen.  No evidence of peritonitis.  No incarcerated hernias. Ext:  SCDs BLE.  No mjr edema.  No cyanosis Skin: No petechiae / purpura   Problem List:   Principal Problem:   Gallstone pancreatitis Active Problems:   HLD (hyperlipidemia)   Essential hypertension   Asthma   Atrial fibrillation   CHF (congestive heart failure)   Hypothyroidism   Preoperative cardiovascular examination   Acute pancreatitis   Assessment  Achilles Dunk  80 y.o. male       Recurrent gallstones with pancreatitis.  Possible ileus brewing.  Plan:  DIFFICULT SITUATION  Standard of care is cholecystectomy after lipase & pain resolved this admission.  Prob can do laparoscopically despite prior open surgery.  ERCP if persistent CBD stones.    Biggest concern is cardiac risk which Med/Cards have noted.  BUT I do not see how observation only will continue to work & his risks will get worse over time & with repeated attacks.  Can try ERCP only & see if can get away with it, but I worry that will not work well.  Seen recurrent episodes in the past with ERCP - but maybe I see only the failures.    Patient & daughter refuse surgery anyway.  Will not force it.  Will see PRN - call w questions  -VTE prophylaxis- SCDs, etc -mobilize as tolerated to help recovery  Adin Hector, M.D., F.A.C.S. [facility] [address] [place], [place] [address] [phone] Main / Paging   09/07/2014   Results:   Labs: Results for orders placed or performed during the hospital encounter of 09/05/14 (from the past 48 hour(s))  Urinalysis, Routine w reflex microscopic     Status: Abnormal   Collection Time: 09/05/14  9:21 PM  Result Value Ref Range   Color, Urine YELLOW YELLOW   APPearance TURBID (A) CLEAR   Specific  Gravity, Urine 1.014 1.005 - 1.030   pH 6.0 5.0 - 8.0   Glucose, UA 100 (A) NEGATIVE mg/dL   Hgb urine dipstick TRACE (A) NEGATIVE   Bilirubin Urine NEGATIVE NEGATIVE   Ketones, ur NEGATIVE NEGATIVE mg/dL   Protein, ur NEGATIVE NEGATIVE mg/dL   Urobilinogen, UA 1.0 0.0 - 1.0 mg/dL   Nitrite NEGATIVE NEGATIVE   Leukocytes, UA TRACE (A) NEGATIVE  Urine microscopic-add on     Status: None   Collection Time: 09/05/14  9:21 PM  Result Value Ref Range   WBC, UA 3-6 <3 WBC/hpf   Bacteria, UA RARE RARE  CBC with Differential     Status: Abnormal   Collection Time: 09/05/14  9:34 PM  Result Value Ref Range   WBC 12.1 (H) 4.0 - 10.5 K/uL   RBC 3.85 (L) 4.22 - 5.81 MIL/uL   Hemoglobin 12.0 (L) 13.0 - 17.0 g/dL   HCT 35.5 (L) 39.0 - 52.0 %   MCV 92.2 78.0 - 100.0 fL   MCH 31.2 26.0 - 34.0 pg   MCHC 33.8 30.0 - 36.0 g/dL   RDW 14.0 11.5 - 15.5 %   Platelets 255 150 - 400 K/uL   Neutrophils Relative % 89 (H) 43 - 77 %   Neutro Abs 10.7 (H) 1.7 - 7.7 K/uL   Lymphocytes Relative 2 (L) 12 - 46 %   Lymphs Abs 0.3 (L) 0.7 - 4.0 K/uL   Monocytes Relative 9 3 - 12 %   Monocytes Absolute 1.1 (H) 0.1 - 1.0 K/uL   Eosinophils Relative 0 0 - 5 %   Eosinophils Absolute 0.0 0.0 - 0.7 K/uL   Basophils Relative 0 0 - 1 %   Basophils Absolute 0.0 0.0 - 0.1 K/uL  Comprehensive metabolic panel     Status: Abnormal   Collection Time: 09/05/14  9:34 PM  Result Value Ref Range   Sodium 139 137 - 147 mEq/L   Potassium 4.2 3.7 - 5.3 mEq/L   Chloride 101 96 - 112 mEq/L   CO2 22 19 - 32 mEq/L   Glucose, Bld 162 (H) 70 - 99 mg/dL   BUN 28 (H) 6 - 23 mg/dL   Creatinine, Ser 1.16 0.50 - 1.35 mg/dL   Calcium 9.6 8.4 - 10.5 mg/dL   Total Protein 7.3 6.0 - 8.3 g/dL   Albumin 3.8 3.5 - 5.2 g/dL   AST 272 (H) 0 - 37 U/L   ALT 176 (H) 0 - 53 U/L   Alkaline Phosphatase 156 (H) 39 - 117 U/L   Total Bilirubin 1.3 (H) 0.3 - 1.2 mg/dL   GFR calc non Af Amer 54 (L) >90 mL/min   GFR calc Af Amer 63 (L) >90 mL/min     Comment: (NOTE) The eGFR has been calculated using the CKD EPI equation. This calculation has not been validated in all clinical situations. eGFR's persistently <90 mL/min signify possible Chronic Kidney Disease.    Anion gap 16 (H) 5 - 15  Lipase, blood  Status: Abnormal   Collection Time: 09/05/14  9:34 PM  Result Value Ref Range   Lipase >3000 (H) 11 - 59 U/L  I-stat troponin, ED (only if pt is 78 y.o. or older & pain is above umbilicus) - do not order at [facility]     Status: None   Collection Time: 09/05/14  9:45 PM  Result Value Ref Range   Troponin i, poc 0.00 0.00 - 0.08 ng/mL   Comment 3            Comment: Due to the release kinetics of cTnI, a negative result within the first hours of the onset of symptoms does not rule out myocardial infarction with certainty. If myocardial infarction is still suspected, repeat the test at appropriate intervals.   I-Stat CG4 Lactic Acid, ED     Status: None   Collection Time: 09/05/14  9:46 PM  Result Value Ref Range   Lactic Acid, Venous 2.08 0.5 - 2.2 mmol/L  Hemoglobin A1c     Status: Abnormal   Collection Time: 09/06/14 12:32 AM  Result Value Ref Range   Hgb A1c MFr Bld 6.1 (H) <5.7 %    Comment: (NOTE)                                                                       According to the ADA Clinical Practice Recommendations for 2011, when HbA1c is used as a screening test:  >=6.5%   Diagnostic of Diabetes Mellitus           (if abnormal result is confirmed) 5.7-6.4%   Increased risk of developing Diabetes Mellitus References:Diagnosis and Classification of Diabetes Mellitus,Diabetes OJJK,0938,18(EXHBZ 1):S62-S69 and Standards of Medical Care in         Diabetes - 2011,Diabetes Care,2011,34 (Suppl 1):S11-S61.    Mean Plasma Glucose 128 (H) <117 mg/dL    Comment: Performed at [facility]     Status: Abnormal   Collection Time: 09/06/14 12:32 AM  Result Value Ref Range   Prothrombin Time 19.1 (H) 11.6  - 15.2 seconds   INR 1.59 (H) 0.00 - 1.49  APTT     Status: None   Collection Time: 09/06/14 12:32 AM  Result Value Ref Range   aPTT 34 24 - 37 seconds  Comprehensive metabolic panel     Status: Abnormal   Collection Time: 09/06/14 12:32 AM  Result Value Ref Range   Sodium 138 137 - 147 mEq/L   Potassium 4.2 3.7 - 5.3 mEq/L   Chloride 101 96 - 112 mEq/L   CO2 22 19 - 32 mEq/L   Glucose, Bld 171 (H) 70 - 99 mg/dL   BUN 26 (H) 6 - 23 mg/dL   Creatinine, Ser 1.11 0.50 - 1.35 mg/dL   Calcium 8.8 8.4 - 10.5 mg/dL   Total Protein 6.6 6.0 - 8.3 g/dL   Albumin 3.5 3.5 - 5.2 g/dL   AST 292 (H) 0 - 37 U/L   ALT 212 (H) 0 - 53 U/L   Alkaline Phosphatase 145 (H) 39 - 117 U/L   Total Bilirubin 1.7 (H) 0.3 - 1.2 mg/dL   GFR calc non Af Amer 57 (L) >90 mL/min   GFR calc Af Amer 66 (L) >  90 mL/min    Comment: (NOTE) The eGFR has been calculated using the CKD EPI equation. This calculation has not been validated in all clinical situations. eGFR's persistently <90 mL/min signify possible Chronic Kidney Disease.    Anion gap 15 5 - 15  CBC     Status: Abnormal   Collection Time: 09/06/14 12:32 AM  Result Value Ref Range   WBC 13.3 (H) 4.0 - 10.5 K/uL   RBC 3.53 (L) 4.22 - 5.81 MIL/uL   Hemoglobin 11.1 (L) 13.0 - 17.0 g/dL   HCT 32.3 (L) 39.0 - 52.0 %   MCV 91.5 78.0 - 100.0 fL   MCH 31.4 26.0 - 34.0 pg   MCHC 34.4 30.0 - 36.0 g/dL   RDW 14.1 11.5 - 15.5 %   Platelets 241 150 - 400 K/uL  Pro b natriuretic peptide (BNP)     Status: Abnormal   Collection Time: 09/06/14 12:33 AM  Result Value Ref Range   Pro B Natriuretic peptide (BNP) 1947.0 (H) 0 - 450 pg/mL  Lipid panel     Status: None   Collection Time: 09/06/14 12:33 AM  Result Value Ref Range   Cholesterol 135 0 - 200 mg/dL   Triglycerides 77 <150 mg/dL   HDL 42 >39 mg/dL   Total CHOL/HDL Ratio 3.2 RATIO   VLDL 15 0 - 40 mg/dL   LDL Cholesterol 78 0 - 99 mg/dL    Comment:        Total Cholesterol/HDL:CHD Risk Coronary Heart  Disease Risk Table                     Men   Women  1/2 Average Risk   3.4   3.3  Average Risk       5.0   4.4  2 X Average Risk   9.6   7.1  3 X Average Risk  23.4   11.0        Use the calculated Patient Ratio above and the CHD Risk Table to determine the patient's CHD Risk.        ATP III CLASSIFICATION (LDL):  <100     mg/dL   Optimal  100-129  mg/dL   Near or Above                    Optimal  130-159  mg/dL   Borderline  160-189  mg/dL   High  >190     mg/dL   Very High Performed at [facility]   Lipase, blood     Status: Abnormal   Collection Time: 09/06/14 12:34 AM  Result Value Ref Range   Lipase >3000 (H) 11 - 59 U/L  Glucose, capillary     Status: Abnormal   Collection Time: 09/06/14  8:01 AM  Result Value Ref Range   Glucose-Capillary 127 (H) 70 - 99 mg/dL  Glucose, capillary     Status: None   Collection Time: 09/06/14 11:55 AM  Result Value Ref Range   Glucose-Capillary 77 70 - 99 mg/dL  Glucose, capillary     Status: Abnormal   Collection Time: 09/06/14  4:08 PM  Result Value Ref Range   Glucose-Capillary 113 (H) 70 - 99 mg/dL  Glucose, capillary     Status: None   Collection Time: 09/06/14  8:42 PM  Result Value Ref Range   Glucose-Capillary 90 70 - 99 mg/dL   Comment 1 Documented in Chart    Comment 2 Notify  RN   CBC     Status: Abnormal   Collection Time: 09/07/14  5:00 AM  Result Value Ref Range   WBC 11.5 (H) 4.0 - 10.5 K/uL   RBC 3.25 (L) 4.22 - 5.81 MIL/uL   Hemoglobin 10.3 (L) 13.0 - 17.0 g/dL   HCT 30.1 (L) 39.0 - 52.0 %   MCV 92.6 78.0 - 100.0 fL   MCH 31.7 26.0 - 34.0 pg   MCHC 34.2 30.0 - 36.0 g/dL   RDW 14.9 11.5 - 15.5 %   Platelets 205 150 - 400 K/uL  Glucose, capillary     Status: Abnormal   Collection Time: 09/07/14  7:26 AM  Result Value Ref Range   Glucose-Capillary 102 (H) 70 - 99 mg/dL    Imaging / Studies: Ct Abdomen Pelvis W Contrast  09/06/2014   CLINICAL DATA:  Left upper abdominal and right abdominal pain  associated with nausea and vomiting. History of gallbladder problems. History of colon cancer and diabetes.  EXAM: CT ABDOMEN AND PELVIS WITH CONTRAST  TECHNIQUE: Multidetector CT imaging of the abdomen and pelvis was performed using the standard protocol following bolus administration of intravenous contrast.  CONTRAST:  162mL OMNIPAQUE IOHEXOL 300 MG/ML  SOLN  COMPARISON:  11/23/2013  FINDINGS: Mild cardiac enlargement. Coronary artery calcification. Infiltration in both lung bases with bronchial wall thickening suggesting bronchitis.  Gallbladder is distended with mild gallbladder wall thickening. Small stones in the gallbladder. No infiltration in the pericholecystic fat. No bile duct dilatation. Similar appearance to previous study. The liver, spleen, adrenal glands, inferior vena cava, and retroperitoneal lymph nodes are unremarkable. Calcification of abdominal aorta and branch vessels without aneurysm. Multiple renal cysts and parenchymal atrophy. No hydronephrosis. Pancreas is unremarkable with improved inflammatory change since previous study. No residual pseudo cysts. Small esophageal hiatal hernia. Stomach is otherwise unremarkable. Small bowel appear normal. Stool-filled colon without distention or wall thickening. No free air or free fluid in the abdomen.  Pelvis: Appendix is probably surgically absent. Bladder wall is not thickened. Small bladder wall calcifications are present. Prostate gland is enlarged with calcification. Diverticulosis of sigmoid colon without evidence of diverticulitis. No free or loculated pelvic fluid collections. No pelvic mass or lymphadenopathy diffuse bone demineralization and degenerative changes. Multiple vertebral compression fractures consistent with osteoporosis. Appearance is unchanged since prior study.  IMPRESSION: Infiltration and bronchial wall thickening in the lung bases suggesting bronchitis. Distended gallbladder with cholelithiasis and mild gallbladder wall  thickening, similar to prior study. Enlarged prostate gland. Changes of osteoporosis with multiple vertebral compression fractures.   Electronically Signed   By: Lucienne Capers M.D.   On: 09/06/2014 00:06   Dg Chest Port 1 View  09/05/2014   CLINICAL DATA:  Fever beginning today. Personal history of colon cancer, asthma, hypertension, atrial fibrillation and congestive heart failure.  EXAM: PORTABLE CHEST - 1 VIEW  COMPARISON:  11/23/2013 and previous  FINDINGS: The heart is at the upper limits of normal in size. There is calcification of the aorta. There is mild chronic scarring in the right mid lung and at both lung bases but no sign of active infiltrate, effusion or collapse.  IMPRESSION: Areas of chronic pulmonary scarring.  No active process evident.   Electronically Signed   By: Nelson Chimes M.D.   On: 09/05/2014 22:41   US Abdomen Limited Ruq  09/06/2014   CLINICAL DATA:  Epigastric abdominal pain. Elevated serum transaminases, hyperbilirubinemia, and elevated serum alkaline phosphatase. Prior history of colon cancer.  EXAM: US  ABDOMEN LIMITED - RIGHT UPPER QUADRANT  COMPARISON:  CT abdomen and pelvis yesterday.  FINDINGS: Gallbladder:  Multiple small shadowing gallstones and moderate amount of echogenic sludge. Largest gallstone on the order of 7 mm. Gallbladder wall thickening up to approximately 6 mm. No pericholecystic fluid. Negative sonographic Murphy sign according to the ultrasound technologist.  Common bile duct:  Diameter: Approximately 7 mm.  No visible bile duct stones. Distal duct obscured by duodenum bowel gas.  Liver:  Normal size and echotexture without focal parenchymal abnormality. Patent portal vein with hepatopetal flow.  IMPRESSION: 1. Cholelithiasis, gallbladder sludge, and gallbladder wall thickening. Negative sonographic Murphy sign. Chronic cholecystitis is suspected. 2. Upper normal caliber common bile duct. While no bile duct stone was visible on the current examination,  review of yesterday's CT suggests that there may be sludge in the distal common bile duct. 3. Normal sonographic appearance of the liver.   Electronically Signed   By: Evangeline Dakin M.D.   On: 09/06/2014 09:45    Medications / Allergies: per chart  Antibiotics: Anti-infectives    Start     Dose/Rate Route Frequency Ordered Stop   09/06/14 0600  piperacillin-tazobactam (ZOSYN) IVPB 3.375 g     3.375 g12.5 mL/hr over 240 Minutes Intravenous 3 times per day 09/06/14 0021     09/05/14 2245  piperacillin-tazobactam (ZOSYN) IVPB 3.375 g     3.375 g12.5 mL/hr over 240 Minutes Intravenous  Once 09/05/14 2241 09/05/14 2330       Note: Portions of this report may have been transcribed using voice recognition software. Every effort was made to ensure accuracy; however, inadvertent computerized transcription errors may be present.   Any transcriptional errors that result from this process are unintentional.

## 2014-09-07 NOTE — Plan of Care (Signed)
Problem: Phase II Progression Outcomes Goal: Blood sugar < 150 Outcome: Completed/Met Date Met:  09/07/14  Problem: Phase III Progression Outcomes Goal: Pain controlled on oral analgesia Outcome: Completed/Met Date Met:  09/07/14 Goal: Absence of DT's (Delirium Tremors) Outcome: Completed/Met Date Met:  09/07/14 Goal: Amylase, WBC trending downward Outcome: Completed/Met Date Met:  09/07/14

## 2014-09-07 NOTE — Progress Notes (Signed)
[facility] PROGRESS NOTE  Todd [name]:[id number] DOB: 1924/03/07 DOA: 09/05/2014 PCP: Jenna Luo TOM, MD  Assessment/Plan:  Gallstone pancreatitis This is patient's second recurrence this year. Patient had refused cholecystectomy in January 2015.  Abdominal ultrasound with, bile duct at the upper limits of normal with possible sludge.  Patient with some clinical improvement denied any abdominal pain no nausea no vomiting, still has significant tenderness.  LFTs still elevated. Check CMP in a.m. Patient also noted to have a leukocytosis. Continue IV antibiotics.  Will continue bowel rest, gentle hydration as patient does have a history of CHF, aortic stenosis and atrial fibrillation.  Cholecystitis likely chronic Abdominal ultrasound showed possible biliary sludge Xarelto on hold. GI has been consulted patient might need a ERCP.  General surgery has also been consulted per GI to reassess for possible cholecystectomy.  Cardiology consulted for risk stratification, of course patient is high risk for surgery, recommended they can get away with ERCP/sphincterotomy alone that will be better choice.  Atrial fibrillation Currently rate controlled. Xarelto on hold. Will place on IV heparin while anticoagulation on hold secondary to prior history of CVA. Cardiology to also address anticoagulation.  Chronic diastolic CHF 2-D echo from January 2013 with a EF of 65%. Patient looks euvolemic clinically with no edema and no crackles noted on examination. proBNP was elevated.Diuretics on hold. Gentle hydration.cardiology consultation pending.  Well-controlled diabetes mellitus Hemoglobin A1c 6.1.CBGs have ranged from 77-127. Continue sliding scale insulin.  Hypothyroidism Continue Synthroid.  Hypertension Continue Cozaar, Norvasc  Hyperlipidemia LDL of 78. Hold statin secondary to elevated LFTs.  Asthma Stable.  Prophylaxis Heparin for DVT prophylaxis.  Code Status:  DNR Family Communication: updated patient, no family present. Disposition Plan: Home when medically stable.   Consultants:  GI: [facility]  CCS pending  Procedures:  Abdominal ultrasound 09/06/2014  CT abd/pelvis 09/05/14  Antibiotics:  Zosyn 09/06/2014  HPI/Subjective: Patient denies any abdominal pain. Patient denies any nausea. No emesis. Patient states he's feeling better.  Objective: Filed Vitals:   09/07/14 0516  BP: 105/37  Pulse: 63  Temp: 99.1 F (37.3 C)  Resp: 20    Intake/Output Summary (Last 24 hours) at 09/07/14 1128 Last data filed at 09/07/14 0807  Gross per 24 hour  Intake 2038.85 ml  Output    700 ml  Net 1338.85 ml   Filed Weights   09/06/14 0015 09/06/14 0425 09/07/14 0516  Weight: 73 kg (160 lb 15 oz) 73 kg (160 lb 15 oz) 78.881 kg (173 lb 14.4 oz)    Exam:   General:  NAD  Cardiovascular: RRR with 3/6 SEM  Respiratory: CTAB  Abdomen: soft, nontender, nondistended, positive bowel sounds.  Musculoskeletal: no clubbing cyanosis or edema.  Data Reviewed: Basic Metabolic Panel:  Recent Labs Lab 09/05/14 2134 09/06/14 0032  NA 139 138  K 4.2 4.2  CL 101 101  CO2 22 22  GLUCOSE 162* 171*  BUN 28* 26*  CREATININE 1.16 1.11  CALCIUM 9.6 8.8   Liver Function Tests:  Recent Labs Lab 09/05/14 2134 09/06/14 0032  AST 272* 292*  ALT 176* 212*  ALKPHOS 156* 145*  BILITOT 1.3* 1.7*  PROT 7.3 6.6  ALBUMIN 3.8 3.5    Recent Labs Lab 09/05/14 2134 09/06/14 0034  LIPASE >3000* >3000*   No results for input(s): AMMONIA in the last 168 hours. CBC:  Recent Labs Lab 09/05/14 2134 09/06/14 0032 09/07/14 0500  WBC 12.1* 13.3* 11.5*  NEUTROABS 10.7*  --   --  HGB 12.0* 11.1* 10.3*  HCT 35.5* 32.3* 30.1*  MCV 92.2 91.5 92.6  PLT 255 241 205   Cardiac Enzymes: No results for input(s): CKTOTAL, CKMB, CKMBINDEX, TROPONINI in the last 168 hours. BNP (last 3 results)  Recent Labs  09/06/14 0033  PROBNP 1947.0*    CBG:  Recent Labs Lab 09/06/14 0801 09/06/14 1155 09/06/14 1608 09/06/14 2042 09/07/14 0726  GLUCAP 127* 77 113* 90 102*    No results found for this or any previous visit (from the past 240 hour(s)).   Studies: Ct Abdomen Pelvis W Contrast  09/06/2014   CLINICAL DATA:  Left upper abdominal and right abdominal pain associated with nausea and vomiting. History of gallbladder problems. History of colon cancer and diabetes.  EXAM: CT ABDOMEN AND PELVIS WITH CONTRAST  TECHNIQUE: Multidetector CT imaging of the abdomen and pelvis was performed using the standard protocol following bolus administration of intravenous contrast.  CONTRAST:  121mL OMNIPAQUE IOHEXOL 300 MG/ML  SOLN  COMPARISON:  11/23/2013  FINDINGS: Mild cardiac enlargement. Coronary artery calcification. Infiltration in both lung bases with bronchial wall thickening suggesting bronchitis.  Gallbladder is distended with mild gallbladder wall thickening. Small stones in the gallbladder. No infiltration in the pericholecystic fat. No bile duct dilatation. Similar appearance to previous study. The liver, spleen, adrenal glands, inferior vena cava, and retroperitoneal lymph nodes are unremarkable. Calcification of abdominal aorta and branch vessels without aneurysm. Multiple renal cysts and parenchymal atrophy. No hydronephrosis. Pancreas is unremarkable with improved inflammatory change since previous study. No residual pseudo cysts. Small esophageal hiatal hernia. Stomach is otherwise unremarkable. Small bowel appear normal. Stool-filled colon without distention or wall thickening. No free air or free fluid in the abdomen.  Pelvis: Appendix is probably surgically absent. Bladder wall is not thickened. Small bladder wall calcifications are present. Prostate gland is enlarged with calcification. Diverticulosis of sigmoid colon without evidence of diverticulitis. No free or loculated pelvic fluid collections. No pelvic mass or lymphadenopathy  diffuse bone demineralization and degenerative changes. Multiple vertebral compression fractures consistent with osteoporosis. Appearance is unchanged since prior study.  IMPRESSION: Infiltration and bronchial wall thickening in the lung bases suggesting bronchitis. Distended gallbladder with cholelithiasis and mild gallbladder wall thickening, similar to prior study. Enlarged prostate gland. Changes of osteoporosis with multiple vertebral compression fractures.   Electronically Signed   By: Lucienne Capers M.D.   On: 09/06/2014 00:06   Dg Chest Port 1 View  09/05/2014   CLINICAL DATA:  Fever beginning today. Personal history of colon cancer, asthma, hypertension, atrial fibrillation and congestive heart failure.  EXAM: PORTABLE CHEST - 1 VIEW  COMPARISON:  11/23/2013 and previous  FINDINGS: The heart is at the upper limits of normal in size. There is calcification of the aorta. There is mild chronic scarring in the right mid lung and at both lung bases but no sign of active infiltrate, effusion or collapse.  IMPRESSION: Areas of chronic pulmonary scarring.  No active process evident.   Electronically Signed   By: Nelson Chimes M.D.   On: 09/05/2014 22:41   US Abdomen Limited Ruq  09/06/2014   CLINICAL DATA:  Epigastric abdominal pain. Elevated serum transaminases, hyperbilirubinemia, and elevated serum alkaline phosphatase. Prior history of colon cancer.  EXAM: US ABDOMEN LIMITED - RIGHT UPPER QUADRANT  COMPARISON:  CT abdomen and pelvis yesterday.  FINDINGS: Gallbladder:  Multiple small shadowing gallstones and moderate amount of echogenic sludge. Largest gallstone on the order of 7 mm. Gallbladder wall thickening up to approximately 6  mm. No pericholecystic fluid. Negative sonographic Murphy sign according to the ultrasound technologist.  Common bile duct:  Diameter: Approximately 7 mm.  No visible bile duct stones. Distal duct obscured by duodenum bowel gas.  Liver:  Normal size and echotexture without  focal parenchymal abnormality. Patent portal vein with hepatopetal flow.  IMPRESSION: 1. Cholelithiasis, gallbladder sludge, and gallbladder wall thickening. Negative sonographic Murphy sign. Chronic cholecystitis is suspected. 2. Upper normal caliber common bile duct. While no bile duct stone was visible on the current examination, review of yesterday's CT suggests that there may be sludge in the distal common bile duct. 3. Normal sonographic appearance of the liver.   Electronically Signed   By: Evangeline Dakin M.D.   On: 09/06/2014 09:45    Scheduled Meds: . amLODipine  5 mg Oral Daily  . antiseptic oral rinse  7 mL Mouth Rinse q12n4p  . chlorhexidine  15 mL Mouth Rinse BID  . ferrous sulfate  325 mg Oral Q breakfast  . insulin aspart  0-9 Units Subcutaneous TID WC  . levothyroxine  12.5 mcg Intravenous Daily  . losartan  50 mg Oral Daily  . mometasone-formoterol  2 puff Inhalation BID  . pantoprazole (PROTONIX) IV  40 mg Intravenous Q1200  . piperacillin-tazobactam (ZOSYN)  IV  3.375 g Intravenous 3 times per day  . sodium chloride  3 mL Intravenous Q12H   Continuous Infusions: . sodium chloride 75 mL/hr at 09/06/14 1500    Principal Problem:   Gallstone pancreatitis Active Problems:   HLD (hyperlipidemia)   Essential hypertension   Asthma   Atrial fibrillation   CHF (congestive heart failure)   Hypothyroidism   Preoperative cardiovascular examination   Acute pancreatitis    Time spent: 35 mins    [facility] A MD [facility] Pager [phone]. If 7PM-7AM, please contact night-coverage at [email or web address], password [facility] 09/07/2014, 11:28 AM  LOS: 2 days

## 2014-09-08 DIAGNOSIS — I5032 Chronic diastolic (congestive) heart failure: Secondary | ICD-10-CM

## 2014-09-08 DIAGNOSIS — K810 Acute cholecystitis: Secondary | ICD-10-CM

## 2014-09-08 LAB — MAGNESIUM: MAGNESIUM: 2.2 mg/dL (ref 1.5–2.5)

## 2014-09-08 LAB — COMPREHENSIVE METABOLIC PANEL
ALT: 124 U/L — AB (ref 0–53)
AST: 68 U/L — ABNORMAL HIGH (ref 0–37)
Albumin: 2.6 g/dL — ABNORMAL LOW (ref 3.5–5.2)
Alkaline Phosphatase: 132 U/L — ABNORMAL HIGH (ref 39–117)
Anion gap: 14 (ref 5–15)
BILIRUBIN TOTAL: 3.3 mg/dL — AB (ref 0.3–1.2)
BUN: 22 mg/dL (ref 6–23)
CHLORIDE: 106 meq/L (ref 96–112)
CO2: 22 meq/L (ref 19–32)
CREATININE: 1.1 mg/dL (ref 0.50–1.35)
Calcium: 8.7 mg/dL (ref 8.4–10.5)
GFR, EST AFRICAN AMERICAN: 67 mL/min — AB (ref >90)
GFR, EST NON AFRICAN AMERICAN: 58 mL/min — AB (ref >90)
GLUCOSE: 93 mg/dL (ref 70–99)
Potassium: 3.5 mEq/L — ABNORMAL LOW (ref 3.7–5.3)
Sodium: 142 mEq/L (ref 137–147)
Total Protein: 5.8 g/dL — ABNORMAL LOW (ref 6.0–8.3)

## 2014-09-08 LAB — CBC
HCT: 29.4 % — ABNORMAL LOW (ref 39.0–52.0)
HEMOGLOBIN: 9.9 g/dL — AB (ref 13.0–17.0)
MCH: 30.9 pg (ref 26.0–34.0)
MCHC: 33.7 g/dL (ref 30.0–36.0)
MCV: 91.9 fL (ref 78.0–100.0)
Platelets: 208 10*3/uL (ref 150–400)
RBC: 3.2 MIL/uL — AB (ref 4.22–5.81)
RDW: 14.8 % (ref 11.5–15.5)
WBC: 9.6 10*3/uL (ref 4.0–10.5)

## 2014-09-08 LAB — GLUCOSE, CAPILLARY
Glucose-Capillary: 96 mg/dL (ref 70–99)
Glucose-Capillary: 106 mg/dL — ABNORMAL HIGH (ref 70–99)
Glucose-Capillary: 82 mg/dL (ref 70–99)
Glucose-Capillary: 96 mg/dL (ref 70–99)

## 2014-09-08 LAB — LIPASE, BLOOD: Lipase: 80 U/L — ABNORMAL HIGH (ref 11–59)

## 2014-09-08 MED ORDER — POTASSIUM CHLORIDE 10 MEQ/100ML IV SOLN
10.0000 meq | INTRAVENOUS | Status: AC
  Administered 2014-09-08 (×4): 10 meq via INTRAVENOUS
  Filled 2014-09-08 (×4): qty 100

## 2014-09-08 NOTE — Progress Notes (Signed)
[facility] Progress Note  Subjective:  Patient continues to deny pain; says the he feels better.  MRI abdomen/MRCP showed the following:  "IMPRESSION: 1. Gallbladder wall thickening, pericholecystic fluid, gallbladder distension, and gallstones within the gallbladder all concern for acute cholecystitis. 2. Moderate peripancreatic fluid is concerning for acute pancreatitis. 3. No evidence of filling defect within the common bile duct to suggest choledocholithiasis. There is no intra or extrahepatic biliary duct dilatation. 4. No organized fluid collections associated pancreatitis. 5. Increasing bibasilar consolidation representing atelectasis versus infiltrates."   Objective:  Vital signs in last 24 hours: Temp:  [97.5 F (36.4 C)-98.7 F (37.1 C)] 97.9 F (36.6 C) (11/08 0500) Pulse Rate:  [48-100] 77 (11/08 0500) Resp:  [18-20] 20 (11/08 0500) BP: (109-126)/(40-50) 123/40 mmHg (11/08 0500) SpO2:  [92 %-98 %] 92 % (11/08 0500) Weight:  [170 lb 6.7 oz (77.3 kg)-173 lb (78.472 kg)] 170 lb 6.7 oz (77.3 kg) (11/08 0500) Last BM Date: 09/05/14 General:  Alert, Well-developed, in NAD Heart:  Regular rate and rhythm; no murmurs Pulm: Abdomen:  Soft, nontender and nondistended. Normal bowel sounds, without guarding, and without rebound.   Extremities:  Without edema. Neurologic:  Alert and  oriented x4;  grossly normal neurologically. Psych:  Alert and cooperative. Normal mood and affect.  Intake/Output from previous day: 11/07 0701 - 11/08 0700 In: 2072.5 [P.O.:120; I.V.:1802.5; IV Piggyback:150] Out: 900 [Urine:900] Intake/Output this shift: Total I/O In: 100 [IV Piggyback:100] Out: -   Lab Results:  Recent Labs  09/06/14 0032 09/07/14 0500 09/08/14 0444  WBC 13.3* 11.5* 9.6  HGB 11.1* 10.3* 9.9*  HCT 32.3* 30.1* 29.4*  PLT 241 205 208   BMET  Recent Labs  09/06/14 0032 09/07/14 1111 09/08/14 0444  NA 138 138 142  K 4.2 3.6* 3.5*    CL 101 104 106  CO2 22 23 22   GLUCOSE 171* 122* 93  BUN 26* 23 22  CREATININE 1.11 1.15 1.10  CALCIUM 8.8 8.6 8.7   LFT  Recent Labs  09/08/14 0444  PROT 5.8*  ALBUMIN 2.6*  AST 68*  ALT 124*  ALKPHOS 132*  BILITOT 3.3*   PT/INR  Recent Labs  09/06/14 0032  LABPROT 19.1*  INR 1.59*   Mr Abdomen Mrcp Wo Cm  09/07/2014   CLINICAL DATA:  Pancreatitis  EXAM: MRI ABDOMEN WITHOUT CONTRAST  (INCLUDING MRCP)  TECHNIQUE: Multiplanar multisequence MR imaging of the abdomen was performed. Heavily T2-weighted images of the biliary and pancreatic ducts were obtained, and three-dimensional MRCP images were rendered by post processing.  COMPARISON:  CT 09/05/2014  FINDINGS: There is bibasilar lung atelectasis versus consolidation. Small pleural effusions. These are increased from comparison CT. No pericardial fluid.  No focal hepatic lesion. No significant intrahepatic biliary duct dilatation. The gallbladder is thick-walled at 7 mm in thickness. There is small amount of pericholecystic fluid surrounding the gallbladder. The gallbladder is distended to 5.7 cm. Several dependent gallstones within the lumen of the gallbladder measuring less than 10 mm each. The common bile duct is normal caliber without evidence of filling defect within the common bile duct. No external compression. The intra and extrahepatic bile ducts are normal caliber.  There is a moderate volume inflammation surrounding the pancreas. There is fluid along the left and right anterior para renal fascial planes. There is no organized fluid collections associated with the pancreas. Pancreatic duct is normal caliber.  The spleen is normal. Adrenal glands are normal. There is a simple appearing cyst  associated with the kidneys.  IMPRESSION: 1. Gallbladder wall thickening, pericholecystic fluid, gallbladder distension, and gallstones within the gallbladder all concern for acute cholecystitis. 2. Moderate peripancreatic fluid is concerning  for acute pancreatitis. 3. No evidence of filling defect within the common bile duct to suggest choledocholithiasis. There is no intra or extrahepatic biliary duct dilatation. 4. No organized fluid collections associated pancreatitis. 5. Increasing bibasilar consolidation representing atelectasis versus infiltrates. Findings conveyed toFloor nurse Ericaon 09/07/2014  at19:53.   Electronically Signed   By: Suzy Bouchard M.D.   On: 09/07/2014 19:53   Mr 3d Recon At Scanner  09/07/2014   CLINICAL DATA:  Pancreatitis  EXAM: MRI ABDOMEN WITHOUT CONTRAST  (INCLUDING MRCP)  TECHNIQUE: Multiplanar multisequence MR imaging of the abdomen was performed. Heavily T2-weighted images of the biliary and pancreatic ducts were obtained, and three-dimensional MRCP images were rendered by post processing.  COMPARISON:  CT 09/05/2014  FINDINGS: There is bibasilar lung atelectasis versus consolidation. Small pleural effusions. These are increased from comparison CT. No pericardial fluid.  No focal hepatic lesion. No significant intrahepatic biliary duct dilatation. The gallbladder is thick-walled at 7 mm in thickness. There is small amount of pericholecystic fluid surrounding the gallbladder. The gallbladder is distended to 5.7 cm. Several dependent gallstones within the lumen of the gallbladder measuring less than 10 mm each. The common bile duct is normal caliber without evidence of filling defect within the common bile duct. No external compression. The intra and extrahepatic bile ducts are normal caliber.  There is a moderate volume inflammation surrounding the pancreas. There is fluid along the left and right anterior para renal fascial planes. There is no organized fluid collections associated with the pancreas. Pancreatic duct is normal caliber.  The spleen is normal. Adrenal glands are normal. There is a simple appearing cyst associated with the kidneys.  IMPRESSION: 1. Gallbladder wall thickening, pericholecystic fluid,  gallbladder distension, and gallstones within the gallbladder all concern for acute cholecystitis. 2. Moderate peripancreatic fluid is concerning for acute pancreatitis. 3. No evidence of filling defect within the common bile duct to suggest choledocholithiasis. There is no intra or extrahepatic biliary duct dilatation. 4. No organized fluid collections associated pancreatitis. 5. Increasing bibasilar consolidation representing atelectasis versus infiltrates. Findings conveyed toFloor nurse Ericaon 09/07/2014  at19:53.   Electronically Signed   By: Suzy Bouchard M.D.   On: 09/07/2014 19:53   US Abdomen Limited Ruq  09/06/2014   CLINICAL DATA:  Epigastric abdominal pain. Elevated serum transaminases, hyperbilirubinemia, and elevated serum alkaline phosphatase. Prior history of colon cancer.  EXAM: US ABDOMEN LIMITED - RIGHT UPPER QUADRANT  COMPARISON:  CT abdomen and pelvis yesterday.  FINDINGS: Gallbladder:  Multiple small shadowing gallstones and moderate amount of echogenic sludge. Largest gallstone on the order of 7 mm. Gallbladder wall thickening up to approximately 6 mm. No pericholecystic fluid. Negative sonographic Murphy sign according to the ultrasound technologist.  Common bile duct:  Diameter: Approximately 7 mm.  No visible bile duct stones. Distal duct obscured by duodenum bowel gas.  Liver:  Normal size and echotexture without focal parenchymal abnormality. Patent portal vein with hepatopetal flow.  IMPRESSION: 1. Cholelithiasis, gallbladder sludge, and gallbladder wall thickening. Negative sonographic Murphy sign. Chronic cholecystitis is suspected. 2. Upper normal caliber common bile duct. While no bile duct stone was visible on the current examination, review of yesterday's CT suggests that there may be sludge in the distal common bile duct. 3. Normal sonographic appearance of the liver.   Electronically Signed  By: Evangeline Dakin M.D.   On: 09/06/2014 09:45    Assessment /  Plan: -Gallstone pancreatitis: Second occurrence this year. Refused cholecystectomy in 11/2013. Likely chronic cholecystitis on U/S, but concern for acute cholecystitis on MRI/MRCP; no biliary ductal dilation or CBD obstruction noted.  LFT's and lipase trending down. -Atrial fibrillation: Xarelto on hold currently. -CHF: Elevated BNP on admission.  *Supportive care with NPO status, IVF's, pain control, anti-emetics. Continue empiric broad spectrum antibiotics (on Zosyn). *Trend LFT's. **This is a tough situation.  There really is no role for ERCP with negative MRCP other than to possibly try to prevent future recurrence of pancreatitis.  With concern for acute cholecystitis on MRCP we would like surgery to see him again (they signed off on 11/7) to help sort this out further with this new information.  He is high risk for surgery and patient family would prefer not to go there if possible.  Perc chole drain could be a consideration but the duration that it can stay in place is the question; is it something that can stay in long-term or does it eventually come out and see how he does??   LOS: 3 days   Oral Remache D.  09/08/2014, 9:13 AM  Pager number [phone]

## 2014-09-08 NOTE — Progress Notes (Signed)
[facility] PROGRESS NOTE  Todd [name] JIR:[id number] DOB: [birth year] DOA: 09/05/2014 PCP: Jenna Luo TOM, MD  Assessment/Plan:  Gallstone pancreatitis This is patient's second recurrence this year. Patient had refused cholecystectomy in January 2015.  Abdominal ultrasound with, bile duct at the upper limits of normal with possible sludge.  Patient with some clinical improvement denied any abdominal pain no nausea no vomiting, still has significant tenderness.  LFTs slowly trending down. Lipase is trending down significantly and currently at 80 from greater than 3000 on admission. Patient with clinical improvement. Continue empiric IV antibiotics. still elevated. Check CMP in a.m. Patient also noted to have a leukocytosis. Continue IV antibiotics.  Will continue bowel rest, gentle hydration as patient does have a history of CHF, aortic stenosis and atrial fibrillation.  Acute versus chronic Cholecystitis  Abdominal ultrasound showed possible biliary sludge Xarelto on hold. GI has been consulted patient might need a ERCP.  General surgery has also been consulted per GI to reassess for possible cholecystectomy. Per cardiology patient is high risk for surgery and ERCP may be the best option. Patient currently refusing surgery at this time. MRCP consistent with acute cholecystitis. Cardiology consulted for risk stratification, of course patient is high risk for surgery, recommended they can get away with ERCP/sphincterotomy alone that will be better choice. Continue empiric IV antibiotics. General surgery and GI following and appreciate ample recommendations.  Atrial fibrillation Currently rate controlled. Xarelto on hold. IV heparin has been discontinued per cardiology recommendations.  Chronic diastolic CHF 2-D echo from January 2013 with a EF of 65%. Patient looks euvolemic clinically with no edema and no crackles noted on examination. proBNP was elevated.Diuretics on hold.  Decrease IV fluids to 50 mL an hour.  Well-controlled diabetes mellitus Hemoglobin A1c 6.1.CBGs have ranged from 82-106. Continue sliding scale insulin.  Hypothyroidism Continue Synthroid.  Hypertension Continue Cozaar, Norvasc  Hyperlipidemia LDL of 78. Hold statin secondary to elevated LFTs.  Asthma Stable.  Prophylaxis Heparin for DVT prophylaxis.  Code Status: DNR Family Communication: updated patient, no family present. Disposition Plan: Home when medically stable.   Consultants:  GI: [facility]: Dr. Hilarie Fredrickson 09/06/2014  [facility] :Dr. Marcello Moores 09/06/2014  Cardiology: Dr. Debara Pickett 09/06/2014  Procedures:  Abdominal ultrasound 09/06/2014  CT abd/pelvis 09/05/14  MRCP 09/07/2014  Chest x-ray 09/05/2014  Antibiotics:  Zosyn 09/06/2014  HPI/Subjective: Patient denies any abdominal pain. Patient denies any nausea. No emesis.   Objective: Filed Vitals:   09/08/14 0500  BP: 123/40  Pulse: 77  Temp: 97.9 F (36.6 C)  Resp: 20    Intake/Output Summary (Last 24 hours) at 09/08/14 1006 Last data filed at 09/08/14 0957  Gross per 24 hour  Intake 2517.5 ml  Output   1000 ml  Net 1517.5 ml   Filed Weights   09/06/14 0425 09/07/14 0516 09/08/14 0500  Weight: 73 kg (160 lb 15 oz) 78.881 kg (173 lb 14.4 oz) 77.3 kg (170 lb 6.7 oz)    Exam:   General:  NAD  Cardiovascular: RRR with 3/6 SEM  Respiratory: CTAB  Abdomen: soft, nontender, nondistended, positive bowel sounds.  Musculoskeletal: no clubbing cyanosis or edema.  Data Reviewed: Basic Metabolic Panel:  Recent Labs Lab 09/05/14 2134 09/06/14 0032 09/07/14 1111 09/08/14 0444  NA 139 138 138 142  K 4.2 4.2 3.6* 3.5*  CL 101 101 104 106  CO2 22 22 23 22   GLUCOSE 162* 171* 122* 93  BUN 28* 26* 23 22  CREATININE 1.16 1.11 1.15 1.10  CALCIUM 9.6 8.8  8.6 8.7  MG  --   --   --  2.2   Liver Function Tests:  Recent Labs Lab 09/05/14 2134 09/06/14 0032 09/07/14 1111 09/08/14 0444  AST  272* 292* 119* 68*  ALT 176* 212* 164* 124*  ALKPHOS 156* 145* 124* 132*  BILITOT 1.3* 1.7* 3.9* 3.3*  PROT 7.3 6.6 5.8* 5.8*  ALBUMIN 3.8 3.5 2.7* 2.6*    Recent Labs Lab 09/05/14 2134 09/06/14 0034 09/07/14 1111 09/08/14 0444  LIPASE >3000* >3000* 203* 80*   No results for input(s): AMMONIA in the last 168 hours. CBC:  Recent Labs Lab 09/05/14 2134 09/06/14 0032 09/07/14 0500 09/08/14 0444  WBC 12.1* 13.3* 11.5* 9.6  NEUTROABS 10.7*  --   --   --   HGB 12.0* 11.1* 10.3* 9.9*  HCT 35.5* 32.3* 30.1* 29.4*  MCV 92.2 91.5 92.6 91.9  PLT 255 241 205 208   Cardiac Enzymes: No results for input(s): CKTOTAL, CKMB, CKMBINDEX, TROPONINI in the last 168 hours. BNP (last 3 results)  Recent Labs  09/06/14 0033  PROBNP 1947.0*   CBG:  Recent Labs Lab 09/07/14 0726 09/07/14 1140 09/07/14 1654 09/07/14 2222 09/08/14 0749  GLUCAP 102* 120* 98 94 96    No results found for this or any previous visit (from the past 240 hour(s)).   Studies: Mr Abdomen Mrcp Wo Cm  09/07/2014   CLINICAL DATA:  Pancreatitis  EXAM: MRI ABDOMEN WITHOUT CONTRAST  (INCLUDING MRCP)  TECHNIQUE: Multiplanar multisequence MR imaging of the abdomen was performed. Heavily T2-weighted images of the biliary and pancreatic ducts were obtained, and three-dimensional MRCP images were rendered by post processing.  COMPARISON:  CT 09/05/2014  FINDINGS: There is bibasilar lung atelectasis versus consolidation. Small pleural effusions. These are increased from comparison CT. No pericardial fluid.  No focal hepatic lesion. No significant intrahepatic biliary duct dilatation. The gallbladder is thick-walled at 7 mm in thickness. There is small amount of pericholecystic fluid surrounding the gallbladder. The gallbladder is distended to 5.7 cm. Several dependent gallstones within the lumen of the gallbladder measuring less than 10 mm each. The common bile duct is normal caliber without evidence of filling defect within  the common bile duct. No external compression. The intra and extrahepatic bile ducts are normal caliber.  There is a moderate volume inflammation surrounding the pancreas. There is fluid along the left and right anterior para renal fascial planes. There is no organized fluid collections associated with the pancreas. Pancreatic duct is normal caliber.  The spleen is normal. Adrenal glands are normal. There is a simple appearing cyst associated with the kidneys.  IMPRESSION: 1. Gallbladder wall thickening, pericholecystic fluid, gallbladder distension, and gallstones within the gallbladder all concern for acute cholecystitis. 2. Moderate peripancreatic fluid is concerning for acute pancreatitis. 3. No evidence of filling defect within the common bile duct to suggest choledocholithiasis. There is no intra or extrahepatic biliary duct dilatation. 4. No organized fluid collections associated pancreatitis. 5. Increasing bibasilar consolidation representing atelectasis versus infiltrates. Findings conveyed toFloor nurse Ericaon 09/07/2014  at19:53.   Electronically Signed   By: Suzy Bouchard M.D.   On: 09/07/2014 19:53   Mr 3d Recon At Scanner  09/07/2014   CLINICAL DATA:  Pancreatitis  EXAM: MRI ABDOMEN WITHOUT CONTRAST  (INCLUDING MRCP)  TECHNIQUE: Multiplanar multisequence MR imaging of the abdomen was performed. Heavily T2-weighted images of the biliary and pancreatic ducts were obtained, and three-dimensional MRCP images were rendered by post processing.  COMPARISON:  CT 09/05/2014  FINDINGS:  There is bibasilar lung atelectasis versus consolidation. Small pleural effusions. These are increased from comparison CT. No pericardial fluid.  No focal hepatic lesion. No significant intrahepatic biliary duct dilatation. The gallbladder is thick-walled at 7 mm in thickness. There is small amount of pericholecystic fluid surrounding the gallbladder. The gallbladder is distended to 5.7 cm. Several dependent gallstones  within the lumen of the gallbladder measuring less than 10 mm each. The common bile duct is normal caliber without evidence of filling defect within the common bile duct. No external compression. The intra and extrahepatic bile ducts are normal caliber.  There is a moderate volume inflammation surrounding the pancreas. There is fluid along the left and right anterior para renal fascial planes. There is no organized fluid collections associated with the pancreas. Pancreatic duct is normal caliber.  The spleen is normal. Adrenal glands are normal. There is a simple appearing cyst associated with the kidneys.  IMPRESSION: 1. Gallbladder wall thickening, pericholecystic fluid, gallbladder distension, and gallstones within the gallbladder all concern for acute cholecystitis. 2. Moderate peripancreatic fluid is concerning for acute pancreatitis. 3. No evidence of filling defect within the common bile duct to suggest choledocholithiasis. There is no intra or extrahepatic biliary duct dilatation. 4. No organized fluid collections associated pancreatitis. 5. Increasing bibasilar consolidation representing atelectasis versus infiltrates. Findings conveyed toFloor nurse Ericaon 09/07/2014  at19:53.   Electronically Signed   By: Suzy Bouchard M.D.   On: 09/07/2014 19:53    Scheduled Meds: . amLODipine  5 mg Oral Daily  . antiseptic oral rinse  7 mL Mouth Rinse q12n4p  . chlorhexidine  15 mL Mouth Rinse BID  . ferrous sulfate  325 mg Oral Q breakfast  . insulin aspart  0-9 Units Subcutaneous TID WC  . levothyroxine  12.5 mcg Intravenous Daily  . losartan  50 mg Oral Daily  . mometasone-formoterol  2 puff Inhalation BID  . pantoprazole (PROTONIX) IV  40 mg Intravenous Q1200  . piperacillin-tazobactam (ZOSYN)  IV  3.375 g Intravenous 3 times per day  . potassium chloride  10 mEq Intravenous Q1 Hr x 4  . sodium chloride  3 mL Intravenous Q12H   Continuous Infusions: . sodium chloride 50 mL/hr at 09/08/14 5631     Principal Problem:   Gallstone pancreatitis Active Problems:   HLD (hyperlipidemia)   Essential hypertension   Asthma   Atrial fibrillation   CHF (congestive heart failure)   Hypothyroidism   Preoperative cardiovascular examination   Acute pancreatitis   Elevated LFTs    Time spent: 41 mins    [facility] MD [facility] Pager [phone]. If 7PM-7AM, please contact night-coverage at [email or web address], password [facility] 09/08/2014, 10:06 AM  LOS: 3 days

## 2014-09-08 NOTE — Progress Notes (Signed)
Occupational Therapy Evaluation Patient Details Name: Todd [name] MRN: [id number] DOB: Sep 03, 1924 Today's Date: 09/08/2014    History of Present Illness 80 y.o. male with past medical history of diabetes type 2, atrial fibrillation on Xarelto, asthma, congestive heart failure, hypertension, hyperlipidemia, hypothyroidism, admitted with nausea, vomiting, and abdominal pain due to Gallstone pancreatitis .   Clinical Impression   Patient presents to OT with decreased ADL independence and safety due to the functional deficits listed below. Will benefit from skilled OT to maximize independence and to facilitate a safe discharge.    Follow Up Recommendations  SNF;Supervision/Assistance - 24 hour    Equipment Recommendations  None recommended by OT    Recommendations for Other Services       Precautions / Restrictions Precautions Precautions: Fall Restrictions Weight Bearing Restrictions: No      Mobility Bed Mobility                  Transfers                      Balance                                            ADL Overall ADL's : Needs assistance/impaired     Grooming: Minimal assistance;Bed level   Upper Body Bathing: Maximal assistance;Bed level   Lower Body Bathing: Total assistance;Bed level   Upper Body Dressing : Maximal assistance;Bed level   Lower Body Dressing: Total assistance;Bed level               Functional mobility during ADLs:  (attempted to sit EOB, pt only tolerated for about 30 seconds) General ADL Comments: Patient confused, agreeable to try working with OT. Supine to sit min A, tolerated sitting EOB about 30 seconds, then pain in abdomen/flank and returned to supine. Max A to reposition in bed comfortably.      Vision                     Perception     Praxis      Pertinent Vitals/Pain Pain Assessment: 0-10 Pain Score: 10-Worst pain ever Pain Location: abdomen and flank Pain  Descriptors / Indicators: Grimacing;Moaning Pain Intervention(s): Patient requesting pain meds-RN notified;Repositioned;Limited activity within patient's tolerance     Hand Dominance Right   Extremity/Trunk Assessment Upper Extremity Assessment Upper Extremity Assessment: Generalized weakness   Lower Extremity Assessment Lower Extremity Assessment: Defer to PT evaluation       Communication Communication Communication: HOH   Cognition Arousal/Alertness: Lethargic;Suspect due to medications Behavior During Therapy: [facility] for tasks assessed/performed Overall Cognitive Status: No family/caregiver present to determine baseline cognitive functioning                     General Comments       Exercises       Shoulder Instructions      Home Living Family/patient expects to be discharged to:: Private residence Living Arrangements: Spouse/significant other Available Help at Discharge: Family;Personal care attendant;Available 24 hours/day Type of Home: House Home Access: Stairs to enter [employer] of Steps: "A step or two".     Home Layout: One level               Home Equipment: Walker - 2 wheels;Cane - single point  Prior Functioning/Environment Level of Independence: Independent with assistive device(s)        Comments: pt states he uses "walking stick"    OT Diagnosis: Generalized weakness;Acute pain   OT Problem List: Decreased strength;Decreased activity tolerance;Decreased cognition;Decreased knowledge of use of DME or AE;Decreased knowledge of precautions;Pain   OT Treatment/Interventions: Self-care/ADL training;Therapeutic exercise;DME and/or AE instruction;Therapeutic activities;Patient/family education    OT Goals(Current goals can be found in the care plan section) Acute Rehab OT Goals OT Goal Formulation: With patient Potential to Achieve Goals: Fair  OT Frequency: Min 2X/week   Barriers to D/C: Decreased caregiver  support (wife at SNF rehab)          Co-evaluation              End of Session Nurse Communication: Patient requests pain meds  Activity Tolerance: Patient limited by pain Patient left: in bed;with call bell/phone within reach;with bed alarm set;with nursing/sitter in room   Time: 5956-3875 OT Time Calculation (min): 16 min Charges:  OT General Charges $OT Visit: 1 Procedure OT Evaluation $Initial OT Evaluation Tier I: 1 Procedure OT Treatments $Therapeutic Activity: 8-22 mins G-Codes:    Keidy Thurgood A 09-09-14, 12:29 PM

## 2014-09-08 NOTE — Progress Notes (Signed)
Patient ID: Todd [name], male   DOB: Jul 04, 1924, 80 y.o.   MRN: [id number]    Subjective: Patient states he feels better. No significant abdominal pain.  Objective: Vital signs in last 24 hours: Temp:  [97.5 F (36.4 C)-98.7 F (37.1 C)] 97.9 F (36.6 C) (11/08 0500) Pulse Rate:  [48-100] 77 (11/08 0500) Resp:  [18-20] 20 (11/08 0500) BP: (109-126)/(40-50) 123/40 mmHg (11/08 0500) SpO2:  [92 %-98 %] 92 % (11/08 0500) Weight:  [170 lb 6.7 oz (77.3 kg)-173 lb (78.472 kg)] 170 lb 6.7 oz (77.3 kg) (11/08 0500) Last BM Date: 09/05/14  Intake/Output from previous day: 11/07 0701 - 11/08 0700 In: 2072.5 [P.O.:120; I.V.:1802.5; IV Piggyback:150] Out: 900 [Urine:900] Intake/Output this shift: Total I/O In: 605 [P.O.:120; I.V.:185; IV Piggyback:300] Out: 100 [Urine:100]  General appearance: alert, cooperative and elderly and somewhat chronically ill-appearing GI: mild right upper quadrant tenderness without guarding or peritoneal signs  Lab Results:   Recent Labs  09/07/14 0500 09/08/14 0444  WBC 11.5* 9.6  HGB 10.3* 9.9*  HCT 30.1* 29.4*  PLT 205 208   BMET  Recent Labs  09/07/14 1111 09/08/14 0444  NA 138 142  K 3.6* 3.5*  CL 104 106  CO2 23 22  GLUCOSE 122* 93  BUN 23 22  CREATININE 1.15 1.10  CALCIUM 8.6 8.7   Lab Results  Component Value Date   ALT 124* 09/08/2014   AST 68* 09/08/2014   ALKPHOS 132* 09/08/2014   BILITOT 3.3* 09/08/2014   Lab Results  Component Value Date   LIPASE 80* 09/08/2014      Studies/Results: Mr Abdomen Mrcp Wo Cm  09/07/2014   CLINICAL DATA:  Pancreatitis  EXAM: MRI ABDOMEN WITHOUT CONTRAST  (INCLUDING MRCP)  TECHNIQUE: Multiplanar multisequence MR imaging of the abdomen was performed. Heavily T2-weighted images of the biliary and pancreatic ducts were obtained, and three-dimensional MRCP images were rendered by post processing.  COMPARISON:  CT 09/05/2014  FINDINGS: There is bibasilar lung atelectasis versus consolidation.  Small pleural effusions. These are increased from comparison CT. No pericardial fluid.  No focal hepatic lesion. No significant intrahepatic biliary duct dilatation. The gallbladder is thick-walled at 7 mm in thickness. There is small amount of pericholecystic fluid surrounding the gallbladder. The gallbladder is distended to 5.7 cm. Several dependent gallstones within the lumen of the gallbladder measuring less than 10 mm each. The common bile duct is normal caliber without evidence of filling defect within the common bile duct. No external compression. The intra and extrahepatic bile ducts are normal caliber.  There is a moderate volume inflammation surrounding the pancreas. There is fluid along the left and right anterior para renal fascial planes. There is no organized fluid collections associated with the pancreas. Pancreatic duct is normal caliber.  The spleen is normal. Adrenal glands are normal. There is a simple appearing cyst associated with the kidneys.  IMPRESSION: 1. Gallbladder wall thickening, pericholecystic fluid, gallbladder distension, and gallstones within the gallbladder all concern for acute cholecystitis. 2. Moderate peripancreatic fluid is concerning for acute pancreatitis. 3. No evidence of filling defect within the common bile duct to suggest choledocholithiasis. There is no intra or extrahepatic biliary duct dilatation. 4. No organized fluid collections associated pancreatitis. 5. Increasing bibasilar consolidation representing atelectasis versus infiltrates. Findings conveyed toFloor nurse Ericaon 09/07/2014  at19:53.   Electronically Signed   By: Suzy Bouchard M.D.   On: 09/07/2014 19:53   Mr 3d Recon At Scanner  09/07/2014   CLINICAL DATA:  Pancreatitis  EXAM: MRI ABDOMEN WITHOUT CONTRAST  (INCLUDING MRCP)  TECHNIQUE: Multiplanar multisequence MR imaging of the abdomen was performed. Heavily T2-weighted images of the biliary and pancreatic ducts were obtained, and three-dimensional  MRCP images were rendered by post processing.  COMPARISON:  CT 09/05/2014  FINDINGS: There is bibasilar lung atelectasis versus consolidation. Small pleural effusions. These are increased from comparison CT. No pericardial fluid.  No focal hepatic lesion. No significant intrahepatic biliary duct dilatation. The gallbladder is thick-walled at 7 mm in thickness. There is small amount of pericholecystic fluid surrounding the gallbladder. The gallbladder is distended to 5.7 cm. Several dependent gallstones within the lumen of the gallbladder measuring less than 10 mm each. The common bile duct is normal caliber without evidence of filling defect within the common bile duct. No external compression. The intra and extrahepatic bile ducts are normal caliber.  There is a moderate volume inflammation surrounding the pancreas. There is fluid along the left and right anterior para renal fascial planes. There is no organized fluid collections associated with the pancreas. Pancreatic duct is normal caliber.  The spleen is normal. Adrenal glands are normal. There is a simple appearing cyst associated with the kidneys.  IMPRESSION: 1. Gallbladder wall thickening, pericholecystic fluid, gallbladder distension, and gallstones within the gallbladder all concern for acute cholecystitis. 2. Moderate peripancreatic fluid is concerning for acute pancreatitis. 3. No evidence of filling defect within the common bile duct to suggest choledocholithiasis. There is no intra or extrahepatic biliary duct dilatation. 4. No organized fluid collections associated pancreatitis. 5. Increasing bibasilar consolidation representing atelectasis versus infiltrates. Findings conveyed toFloor nurse Ericaon 09/07/2014  at19:53.   Electronically Signed   By: Suzy Bouchard M.D.   On: 09/07/2014 19:53    Anti-infectives: Anti-infectives    Start     Dose/Rate Route Frequency Ordered Stop   09/06/14 0600  piperacillin-tazobactam (ZOSYN) IVPB 3.375 g      3.375 g12.5 mL/hr over 240 Minutes Intravenous 3 times per day 09/06/14 0021     09/05/14 2245  piperacillin-tazobactam (ZOSYN) IVPB 3.375 g     3.375 g12.5 mL/hr over 240 Minutes Intravenous  Once 09/05/14 2241 09/05/14 2330      Assessment/Plan: Cholelithiasis and recent episode of biliary pancreatitis. He has some persistently mildly elevated LFTs. Clinically somewhat improved in terms of pain and tenderness. MR CP shows some evidence of acute cholecystitis. I discussed these findings with the patient. His daughter is not present at this time. I discussed that our current treatment may not be enough to resolve acute cholecystitis if indeed it is present. I discussed the option of surgery again and he is clearly dead set against this. I discussed percutaneous drainage and at this point he is even reluctant to go through that. I did tell him that percutaneous drainage at least baby necessary to get him over this episode. He would like to think this over and we will continue to follow for now.     LOS: 3 days    Alysson Geist T 09/08/2014

## 2014-09-09 DIAGNOSIS — K851 Biliary acute pancreatitis without necrosis or infection: Secondary | ICD-10-CM | POA: Diagnosis present

## 2014-09-09 LAB — CBC
HCT: 29.9 % — ABNORMAL LOW (ref 39.0–52.0)
HEMOGLOBIN: 9.9 g/dL — AB (ref 13.0–17.0)
MCH: 30.9 pg (ref 26.0–34.0)
MCHC: 33.1 g/dL (ref 30.0–36.0)
MCV: 93.4 fL (ref 78.0–100.0)
Platelets: 201 10*3/uL (ref 150–400)
RBC: 3.2 MIL/uL — ABNORMAL LOW (ref 4.22–5.81)
RDW: 14.7 % (ref 11.5–15.5)
WBC: 8.5 10*3/uL (ref 4.0–10.5)

## 2014-09-09 LAB — COMPREHENSIVE METABOLIC PANEL
ALBUMIN: 2.4 g/dL — AB (ref 3.5–5.2)
ALT: 86 U/L — ABNORMAL HIGH (ref 0–53)
AST: 34 U/L (ref 0–37)
Alkaline Phosphatase: 144 U/L — ABNORMAL HIGH (ref 39–117)
Anion gap: 13 (ref 5–15)
BILIRUBIN TOTAL: 1.9 mg/dL — AB (ref 0.3–1.2)
BUN: 17 mg/dL (ref 6–23)
CO2: 22 mEq/L (ref 19–32)
CREATININE: 0.96 mg/dL (ref 0.50–1.35)
Calcium: 8.9 mg/dL (ref 8.4–10.5)
Chloride: 110 mEq/L (ref 96–112)
GFR calc Af Amer: 83 mL/min — ABNORMAL LOW (ref >90)
GFR calc non Af Amer: 72 mL/min — ABNORMAL LOW (ref >90)
GLUCOSE: 85 mg/dL (ref 70–99)
Potassium: 3.7 mEq/L (ref 3.7–5.3)
Sodium: 145 mEq/L (ref 137–147)
TOTAL PROTEIN: 6.2 g/dL (ref 6.0–8.3)

## 2014-09-09 LAB — GLUCOSE, CAPILLARY
GLUCOSE-CAPILLARY: 78 mg/dL (ref 70–99)
GLUCOSE-CAPILLARY: 73 mg/dL (ref 70–99)
GLUCOSE-CAPILLARY: 127 mg/dL — AB (ref 70–99)
GLUCOSE-CAPILLARY: 160 mg/dL — AB (ref 70–99)

## 2014-09-09 LAB — LIPASE, BLOOD: LIPASE: 65 U/L — AB (ref 11–59)

## 2014-09-09 NOTE — Progress Notes (Signed)
Physical Therapy Treatment Patient Details Name: Todd [name] MRN: [id number] DOB: [birth year] Today's Date: 09/09/2014    History of Present Illness 80 y.o. male with past medical history of diabetes type 2, atrial fibrillation on Xarelto, asthma, congestive heart failure, hypertension, hyperlipidemia, hypothyroidism, admitted with nausea, vomiting, and abdominal pain due to Gallstone pancreatitis .    PT Comments    Pt very motivated and with good progress this date.  Pt maintained O2 sats <96% on 1L O2.  Follow Up Recommendations  SNF;Supervision/Assistance - 24 hour     Equipment Recommendations  None recommended by PT    Recommendations for Other Services       Precautions / Restrictions Precautions Precautions: Fall Restrictions Weight Bearing Restrictions: No    Mobility  Bed Mobility Overal bed mobility: Needs Assistance Bed Mobility: Supine to Sit     Supine to sit: Min guard        Transfers Overall transfer level: Needs assistance Equipment used: Rolling walker (2 wheeled) Transfers: Sit to/from Stand Sit to Stand: Min assist         General transfer comment: cues for transition position and use of UEs to self assist  Ambulation/Gait Ambulation/Gait assistance: Min assist;+2 safety/equipment Ambulation Distance (Feet): 100 Feet (twice) Assistive device: Rolling walker (2 wheeled) Gait Pattern/deviations: Step-to pattern;Step-through pattern;Decreased step length - right;Decreased step length - left;Shuffle;Trunk flexed     General Gait Details: cues for posture and position from RW.  Assist to steady and to control RW   Stairs            Wheelchair Mobility    Modified Rankin (Stroke Patients Only)       Balance Overall balance assessment: Needs assistance Sitting-balance support: No upper extremity supported;Feet supported Sitting balance-Leahy Scale: Good Sitting balance - Comments: at EOB several minutes without  difficulty   Standing balance support: Bilateral upper extremity supported Standing balance-Leahy Scale: Poor                      Cognition Arousal/Alertness: Awake/alert Behavior During Therapy: WFL for tasks assessed/performed Overall Cognitive Status: Within Functional Limits for tasks assessed       Memory: Decreased short-term memory              Exercises      General Comments        Pertinent Vitals/Pain Pain Assessment: 0-10 Faces Pain Scale: Hurts a little bit Pain Intervention(s): Limited activity within patient's tolerance;Monitored during session;Premedicated before session    Home Living                      Prior Function            PT Goals (current goals can now be found in the care plan section) Acute Rehab PT Goals Patient Stated Goal: Walk  PT Goal Formulation: With patient Time For Goal Achievement: 09/20/14 Potential to Achieve Goals: Good Progress towards PT goals: Progressing toward goals    Frequency  Min 3X/week    PT Plan Current plan remains appropriate    Co-evaluation             End of Session Equipment Utilized During Treatment: Gait belt Activity Tolerance: Patient tolerated treatment well Patient left: in chair;with call bell/phone within reach     Time: 1327-1350 PT Time Calculation (min): 23 min  Charges:  $Gait Training: 23-37 mins  G Codes:      Todd [name] 2014/10/06, 3:06 PM

## 2014-09-09 NOTE — Progress Notes (Signed)
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 Patient had refused cholecystectomy in January 2015.  Abdominal ultrasound with, bile duct at the upper limits of normal with possible sludge.  Patient with some clinical improvement denied any abdominal pain no nausea no vomiting, still has significant tenderness.  LFTs slowly trending down. Lipase is trending down significantly and currently at 65 from greater than 3000 on admission. Patient with clinical improvement. Continue empiric IV antibiotics. still elevated. Check CMP in a.m. Patient also noted to have a leukocytosis. Continue IV antibiotics.  Will continue gentle hydration as patient does have a history of CHF, aortic stenosis and atrial fibrillation. ERCP has been discussed with patient and GI and patient to talk with family about it. Patient started on diet today.  Acute versus chronic Cholecystitis  Abdominal ultrasound showed possible biliary sludge Xarelto on hold. GI has been consulted patient might need a ERCP.  General surgery has also been consulted per GI to reassess for possible cholecystectomy. Per cardiology patient is high risk for surgery and ERCP may be the best option. Patient currently refusing surgery at this time. MRCP consistent with acute cholecystitis. Cardiology consulted for risk stratification, of course patient is high risk for surgery, recommended they can get away with ERCP/sphincterotomy alone that will be better choice. Continue empiric IV antibiotics. General surgery and GI following and appreciate ample recommendations.  Atrial fibrillation Currently rate controlled. Xarelto on hold. IV heparin has been discontinued per cardiology recommendations.  Chronic diastolic CHF 2-D echo from January 2013 with a EF of 65%. Patient looks euvolemic  clinically with no edema and no crackles noted on examination. proBNP was elevated. Diuretics on hold. Continue IV fluids to 50 mL an hour.  Well-controlled diabetes mellitus Hemoglobin A1c 6.1.CBGs have ranged from 73-96. Continue sliding scale insulin.  Hypothyroidism Continue Synthroid.  Hypertension Continue Cozaar, Norvasc  Hyperlipidemia LDL of 78. Hold statin secondary to elevated LFTs.  Asthma Stable.  Prophylaxis Heparin for DVT prophylaxis.  Code Status: DNR Family Communication: updated patient, no family present. Disposition Plan: Home when medically stable.   Consultants:  GI: [facility]: Dr. Hilarie Fredrickson 09/06/2014  [facility] :Dr. Marcello Moores 09/06/2014  Cardiology: Dr. Debara Pickett 09/06/2014  Procedures:  Abdominal ultrasound 09/06/2014  CT abd/pelvis 09/05/14  MRCP 09/07/2014  Chest x-ray 09/05/2014  Antibiotics:  Zosyn 09/06/2014  HPI/Subjective: Patient denies any abdominal pain. Patient denies any nausea. No emesis. Patient asking when he can eat.  Objective: Filed Vitals:   09/09/14 0416  BP: 130/54  Pulse: 63  Temp: 98.8 F (37.1 C)  Resp: 20    Intake/Output Summary (Last 24 hours) at 09/09/14 1119 Last data filed at 09/09/14 0754  Gross per 24 hour  Intake   1340 ml  Output   1175 ml  Net    165 ml   Filed Weights   09/07/14 0516 09/08/14 0500 09/09/14 0416  Weight: 78.881 kg (173 lb 14.4 oz) 77.3 kg (170 lb 6.7 oz) 75.252 kg (165 lb 14.4 oz)    Exam:   General:  NAD  Cardiovascular: RRR with 3/6 SEM  Respiratory: CTAB  Abdomen: soft, nontender, nondistended, positive bowel sounds.  Musculoskeletal: no clubbing cyanosis or edema.  Data Reviewed: Basic Metabolic Panel:  Recent Labs Lab 09/05/14 2134 09/06/14 0032 09/07/14 1111 09/08/14 0444 09/09/14 0345  NA 139 138 138 142 145  K 4.2 4.2 3.6* 3.5* 3.7  CL 101 101 104 106 110  CO2 22 22 23 22 22   GLUCOSE 162* 171* 122* 93 85  BUN 28* 26* 23 22 17   CREATININE 1.16 1.11  1.15 1.10 0.96  CALCIUM 9.6 8.8 8.6 8.7 8.9  MG  --   --   --  2.2  --    Liver Function Tests:  Recent Labs Lab 09/05/14 2134 09/06/14 0032 09/07/14 1111 09/08/14 0444 09/09/14 0345  AST 272* 292* 119* 68* 34  ALT 176* 212* 164* 124* 86*  ALKPHOS 156* 145* 124* 132* 144*  BILITOT 1.3* 1.7* 3.9* 3.3* 1.9*  PROT 7.3 6.6 5.8* 5.8* 6.2  ALBUMIN 3.8 3.5 2.7* 2.6* 2.4*    Recent Labs Lab 09/05/14 2134 09/06/14 0034 09/07/14 1111 09/08/14 0444 09/09/14 0345  LIPASE >3000* >3000* 203* 80* 65*   No results for input(s): AMMONIA in the last 168 hours. CBC:  Recent Labs Lab 09/05/14 2134 09/06/14 0032 09/07/14 0500 09/08/14 0444 09/09/14 0345  WBC 12.1* 13.3* 11.5* 9.6 8.5  NEUTROABS 10.7*  --   --   --   --   HGB 12.0* 11.1* 10.3* 9.9* 9.9*  HCT 35.5* 32.3* 30.1* 29.4* 29.9*  MCV 92.2 91.5 92.6 91.9 93.4  PLT 255 241 205 208 201   Cardiac Enzymes: No results for input(s): CKTOTAL, CKMB, CKMBINDEX, TROPONINI in the last 168 hours. BNP (last 3 results)  Recent Labs  09/06/14 0033  PROBNP 1947.0*   CBG:  Recent Labs Lab 09/08/14 0749 09/08/14 1143 09/08/14 1630 09/08/14 2224 09/09/14 0753  GLUCAP 96 106* 82 96 78    Recent Results (from the past 240 hour(s))  Culture, blood (routine x 2)     Status: None (Preliminary result)   Collection Time: 09/05/14  9:34 PM  Result Value Ref Range Status   Specimen Description BLOOD LEFT HAND  Final   Special Requests BOTTLES DRAWN AEROBIC AND ANAEROBIC 3CC  Final   Culture  Setup Time   Final    09/06/2014 01:00 Performed at [facility]    Culture   Final           BLOOD CULTURE RECEIVED NO GROWTH TO DATE CULTURE WILL BE HELD FOR 5 DAYS BEFORE ISSUING A FINAL NEGATIVE REPORT Performed at [facility]    Report Status PENDING  Incomplete  Culture, blood (routine x 2)     Status: None (Preliminary result)   Collection Time: 09/05/14  9:34 PM  Result Value Ref Range Status   Specimen  Description BLOOD RIGHT ANTECUBITAL  Final   Special Requests BOTTLES DRAWN AEROBIC AND ANAEROBIC 3CC  Final   Culture  Setup Time   Final    09/06/2014 01:00 Performed at [facility]    Culture   Final           BLOOD CULTURE RECEIVED NO GROWTH TO DATE CULTURE WILL BE HELD FOR 5 DAYS BEFORE ISSUING A FINAL NEGATIVE REPORT Performed at [facility]    Report Status PENDING  Incomplete     Studies: Mr Abdomen Mrcp Wo Cm  09/07/2014   CLINICAL DATA:  Pancreatitis  EXAM: MRI ABDOMEN WITHOUT CONTRAST  (INCLUDING MRCP)  TECHNIQUE: Multiplanar multisequence MR imaging of the abdomen was performed. Heavily T2-weighted images of the biliary and pancreatic ducts were obtained, and three-dimensional MRCP images were rendered by post processing.  COMPARISON:  CT 09/05/2014  FINDINGS: There is bibasilar lung atelectasis versus consolidation. Small pleural effusions. These are increased from comparison CT.  No pericardial fluid.  No focal hepatic lesion. No significant intrahepatic biliary duct dilatation. The gallbladder is thick-walled at 7 mm in thickness. There is small amount of pericholecystic fluid surrounding the gallbladder. The gallbladder is distended to 5.7 cm. Several dependent gallstones within the lumen of the gallbladder measuring less than 10 mm each. The common bile duct is normal caliber without evidence of filling defect within the common bile duct. No external compression. The intra and extrahepatic bile ducts are normal caliber.  There is a moderate volume inflammation surrounding the pancreas. There is fluid along the left and right anterior para renal fascial planes. There is no organized fluid collections associated with the pancreas. Pancreatic duct is normal caliber.  The spleen is normal. Adrenal glands are normal. There is a simple appearing cyst associated with the kidneys.  IMPRESSION: 1. Gallbladder wall thickening, pericholecystic fluid, gallbladder distension, and  gallstones within the gallbladder all concern for acute cholecystitis. 2. Moderate peripancreatic fluid is concerning for acute pancreatitis. 3. No evidence of filling defect within the common bile duct to suggest choledocholithiasis. There is no intra or extrahepatic biliary duct dilatation. 4. No organized fluid collections associated pancreatitis. 5. Increasing bibasilar consolidation representing atelectasis versus infiltrates. Findings conveyed toFloor nurse Ericaon 09/07/2014  at19:53.   Electronically Signed   By: Suzy Bouchard M.D.   On: 09/07/2014 19:53   Mr 3d Recon At Scanner  09/07/2014   CLINICAL DATA:  Pancreatitis  EXAM: MRI ABDOMEN WITHOUT CONTRAST  (INCLUDING MRCP)  TECHNIQUE: Multiplanar multisequence MR imaging of the abdomen was performed. Heavily T2-weighted images of the biliary and pancreatic ducts were obtained, and three-dimensional MRCP images were rendered by post processing.  COMPARISON:  CT 09/05/2014  FINDINGS: There is bibasilar lung atelectasis versus consolidation. Small pleural effusions. These are increased from comparison CT. No pericardial fluid.  No focal hepatic lesion. No significant intrahepatic biliary duct dilatation. The gallbladder is thick-walled at 7 mm in thickness. There is small amount of pericholecystic fluid surrounding the gallbladder. The gallbladder is distended to 5.7 cm. Several dependent gallstones within the lumen of the gallbladder measuring less than 10 mm each. The common bile duct is normal caliber without evidence of filling defect within the common bile duct. No external compression. The intra and extrahepatic bile ducts are normal caliber.  There is a moderate volume inflammation surrounding the pancreas. There is fluid along the left and right anterior para renal fascial planes. There is no organized fluid collections associated with the pancreas. Pancreatic duct is normal caliber.  The spleen is normal. Adrenal glands are normal. There is a  simple appearing cyst associated with the kidneys.  IMPRESSION: 1. Gallbladder wall thickening, pericholecystic fluid, gallbladder distension, and gallstones within the gallbladder all concern for acute cholecystitis. 2. Moderate peripancreatic fluid is concerning for acute pancreatitis. 3. No evidence of filling defect within the common bile duct to suggest choledocholithiasis. There is no intra or extrahepatic biliary duct dilatation. 4. No organized fluid collections associated pancreatitis. 5. Increasing bibasilar consolidation representing atelectasis versus infiltrates. Findings conveyed toFloor nurse Ericaon 09/07/2014  at19:53.   Electronically Signed   By: Suzy Bouchard M.D.   On: 09/07/2014 19:53    Scheduled Meds: . amLODipine  5 mg Oral Daily  . antiseptic oral rinse  7 mL Mouth Rinse q12n4p  . chlorhexidine  15 mL Mouth Rinse BID  . ferrous sulfate  325 mg Oral Q breakfast  . insulin aspart  0-9 Units Subcutaneous TID WC  . levothyroxine  12.5 mcg Intravenous Daily  . losartan  50 mg Oral Daily  . mometasone-formoterol  2 puff Inhalation BID  . pantoprazole (PROTONIX) IV  40 mg Intravenous Q1200  . piperacillin-tazobactam (ZOSYN)  IV  3.375 g Intravenous 3 times per day  . sodium chloride  3 mL Intravenous Q12H   Continuous Infusions:    Principal Problem:   Gallstone pancreatitis Active Problems:   HLD (hyperlipidemia)   Essential hypertension   Asthma   Atrial fibrillation   CHF (congestive heart failure)   Hypothyroidism   Preoperative cardiovascular examination   Acute pancreatitis   Elevated LFTs   Cholecystitis, acute    Time spent: 71 mins    [facility] MD [facility] Pager [phone]. If 7PM-7AM, please contact night-coverage at [email or web address], password [facility] 09/09/2014, 11:19 AM  LOS: 4 days

## 2014-09-09 NOTE — Progress Notes (Addendum)
Progress Note   Subjective  No abdominal pain. No complaints   Objective   Vital signs in last 24 hours: Temp:  [98.2 F (36.8 C)-98.8 F (37.1 C)] 98.8 F (37.1 C) (11/09 0416) Pulse Rate:  [57-72] 63 (11/09 0416) Resp:  [18-24] 20 (11/09 0416) BP: (108-130)/(37-62) 130/54 mmHg (11/09 0416) SpO2:  [94 %-98 %] 94 % (11/09 0810) Weight:  [165 lb 14.4 oz (75.252 kg)] 165 lb 14.4 oz (75.252 kg) (11/09 0416) Last BM Date: 09/05/14 General:    Pleasant white male in NAD. Very HOH Heart:  Regular rate, irregular rhythm Lungs: Respirations even and unlabored, RLL , lungs CTA bilaterally Abdomen:  Soft, nontender and nondistended. Normal bowel sounds. Extremities:  Without edema. Neurologic:  Alert and oriented,  grossly normal neurologically. Psych:  Cooperative. Normal mood and affect.  Lab Results:  Recent Labs  09/07/14 0500 09/08/14 0444 09/09/14 0345  WBC 11.5* 9.6 8.5  HGB 10.3* 9.9* 9.9*  HCT 30.1* 29.4* 29.9*  PLT 205 208 201   BMET  Recent Labs  09/07/14 1111 09/08/14 0444 09/09/14 0345  NA 138 142 145  K 3.6* 3.5* 3.7  CL 104 106 110  CO2 23 22 22   GLUCOSE 122* 93 85  BUN 23 22 17   CREATININE 1.15 1.10 0.96  CALCIUM 8.6 8.7 8.9   LFT  Recent Labs  09/09/14 0345  PROT 6.2  ALBUMIN 2.4*  AST 34  ALT 86*  ALKPHOS 144*  BILITOT 1.9*    Studies/Results: Mr Abdomen Mrcp Wo Cm  09/07/2014   CLINICAL DATA:  Pancreatitis  EXAM: MRI ABDOMEN WITHOUT CONTRAST  (INCLUDING MRCP)  TECHNIQUE: Multiplanar multisequence MR imaging of the abdomen was performed. Heavily T2-weighted images of the biliary and pancreatic ducts were obtained, and three-dimensional MRCP images were rendered by post processing.  COMPARISON:  CT 09/05/2014  FINDINGS: There is bibasilar lung atelectasis versus consolidation. Small pleural effusions. These are increased from comparison CT. No pericardial fluid.  No focal hepatic lesion. No significant intrahepatic biliary duct  dilatation. The gallbladder is thick-walled at 7 mm in thickness. There is small amount of pericholecystic fluid surrounding the gallbladder. The gallbladder is distended to 5.7 cm. Several dependent gallstones within the lumen of the gallbladder measuring less than 10 mm each. The common bile duct is normal caliber without evidence of filling defect within the common bile duct. No external compression. The intra and extrahepatic bile ducts are normal caliber.  There is a moderate volume inflammation surrounding the pancreas. There is fluid along the left and right anterior para renal fascial planes. There is no organized fluid collections associated with the pancreas. Pancreatic duct is normal caliber.  The spleen is normal. Adrenal glands are normal. There is a simple appearing cyst associated with the kidneys.  IMPRESSION: 1. Gallbladder wall thickening, pericholecystic fluid, gallbladder distension, and gallstones within the gallbladder all concern for acute cholecystitis. 2. Moderate peripancreatic fluid is concerning for acute pancreatitis. 3. No evidence of filling defect within the common bile duct to suggest choledocholithiasis. There is no intra or extrahepatic biliary duct dilatation. 4. No organized fluid collections associated pancreatitis. 5. Increasing bibasilar consolidation representing atelectasis versus infiltrates. Findings conveyed toFloor nurse Ericaon 09/07/2014  at19:53.   Electronically Signed   By: Suzy Bouchard M.D.   On: 09/07/2014 19:53   Mr 3d Recon At Scanner  09/07/2014   CLINICAL DATA:  Pancreatitis  EXAM: MRI ABDOMEN WITHOUT CONTRAST  (INCLUDING MRCP)  TECHNIQUE: Multiplanar multisequence MR imaging of  the abdomen was performed. Heavily T2-weighted images of the biliary and pancreatic ducts were obtained, and three-dimensional MRCP images were rendered by post processing.  COMPARISON:  CT 09/05/2014  FINDINGS: There is bibasilar lung atelectasis versus consolidation. Small  pleural effusions. These are increased from comparison CT. No pericardial fluid.  No focal hepatic lesion. No significant intrahepatic biliary duct dilatation. The gallbladder is thick-walled at 7 mm in thickness. There is small amount of pericholecystic fluid surrounding the gallbladder. The gallbladder is distended to 5.7 cm. Several dependent gallstones within the lumen of the gallbladder measuring less than 10 mm each. The common bile duct is normal caliber without evidence of filling defect within the common bile duct. No external compression. The intra and extrahepatic bile ducts are normal caliber.  There is a moderate volume inflammation surrounding the pancreas. There is fluid along the left and right anterior para renal fascial planes. There is no organized fluid collections associated with the pancreas. Pancreatic duct is normal caliber.  The spleen is normal. Adrenal glands are normal. There is a simple appearing cyst associated with the kidneys.  IMPRESSION: 1. Gallbladder wall thickening, pericholecystic fluid, gallbladder distension, and gallstones within the gallbladder all concern for acute cholecystitis. 2. Moderate peripancreatic fluid is concerning for acute pancreatitis. 3. No evidence of filling defect within the common bile duct to suggest choledocholithiasis. There is no intra or extrahepatic biliary duct dilatation. 4. No organized fluid collections associated pancreatitis. 5. Increasing bibasilar consolidation representing atelectasis versus infiltrates. Findings conveyed toFloor nurse Ericaon 09/07/2014  at19:53.   Electronically Signed   By: Suzy Bouchard M.D.   On: 09/07/2014 19:53     Assessment / Plan:   59. 80 year old male with gallstone pancreatitis, recurrent. Also, MRCP raises suspicion for acute cholecystitis. Patient previously refused cholecystectomy. No ductal dilation on imaging. He may have passed a gallstone. Transaminases trending down, bili improved. Surgery  following, patient still adamantly opposed to sugery. Percutaneous drain being entertained. He is on Zosyn  2. Wheezing. Increasing bibasilar consolidation of lungs on MRCP.    LOS: 4 days   Tye Savoy  09/09/2014, 9:08 AM   ________________________________________________________________________  [facility] MD note:  I personally examined the patient, reviewed the data and agree with the assessment and plan described above.  I agree with Dr. Marcello Moores from surgery that ERCP is reasonable with sphincterotomy to decrease risk of GS pancreatitis again.  I spoke with him and his daughter about this.  I explained that there are risks and that ERCP would not treat cholecystitis.  He is pretty clear that he wants NO surgeries or procedures.  I discussed with his daughter and she is planning to talk with him this afternoon and will give [place] final answer later today or tomorrow.  If he decides against ERCP, I recommend changing to PO ABX tomorrow and if he does well he can go home Wednesday.  I let him eat today.   Owens Loffler, MD [facility] Gastroenterology Pager [phone]

## 2014-09-09 NOTE — Progress Notes (Signed)
Patient ID: Todd [name], male   DOB: [birth year], 80 y.o.   MRN: [id number]    Subjective: Patient states he feels better. No significant abdominal pain.  Objective: Vital signs in last 24 hours: Temp:  [98.2 F (36.8 C)-98.8 F (37.1 C)] 98.8 F (37.1 C) (11/09 0416) Pulse Rate:  [57-72] 63 (11/09 0416) Resp:  [18-24] 20 (11/09 0416) BP: (108-130)/(37-62) 130/54 mmHg (11/09 0416) SpO2:  [94 %-98 %] 94 % (11/09 0810) Weight:  [165 lb 14.4 oz (75.252 kg)] 165 lb 14.4 oz (75.252 kg) (11/09 0416) Last BM Date: 09/05/14  Intake/Output from previous day: 11/08 0701 - 11/09 0700 In: 1945 [P.O.:240; I.V.:1255; IV Piggyback:450] Out: 1100 [Urine:1100] Intake/Output this shift: Total I/O In: -  Out: 175 [Urine:175]  General appearance: alert, cooperative and elderly and somewhat chronically ill-appearing GI: mild right upper quadrant tenderness without guarding or peritoneal signs  Lab Results:   Recent Labs  09/08/14 0444 09/09/14 0345  WBC 9.6 8.5  HGB 9.9* 9.9*  HCT 29.4* 29.9*  PLT 208 201   BMET  Recent Labs  09/08/14 0444 09/09/14 0345  NA 142 145  K 3.5* 3.7  CL 106 110  CO2 22 22  GLUCOSE 93 85  BUN 22 17  CREATININE 1.10 0.96  CALCIUM 8.7 8.9   Lab Results  Component Value Date   ALT 86* 09/09/2014   AST 34 09/09/2014   ALKPHOS 144* 09/09/2014   BILITOT 1.9* 09/09/2014   Lab Results  Component Value Date   LIPASE 65* 09/09/2014      Studies/Results: Mr Abdomen Mrcp Wo Cm  09/07/2014   CLINICAL DATA:  Pancreatitis  EXAM: MRI ABDOMEN WITHOUT CONTRAST  (INCLUDING MRCP)  TECHNIQUE: Multiplanar multisequence MR imaging of the abdomen was performed. Heavily T2-weighted images of the biliary and pancreatic ducts were obtained, and three-dimensional MRCP images were rendered by post processing.  COMPARISON:  CT 09/05/2014  FINDINGS: There is bibasilar lung atelectasis versus consolidation. Small pleural effusions. These are increased from comparison  CT. No pericardial fluid.  No focal hepatic lesion. No significant intrahepatic biliary duct dilatation. The gallbladder is thick-walled at 7 mm in thickness. There is small amount of pericholecystic fluid surrounding the gallbladder. The gallbladder is distended to 5.7 cm. Several dependent gallstones within the lumen of the gallbladder measuring less than 10 mm each. The common bile duct is normal caliber without evidence of filling defect within the common bile duct. No external compression. The intra and extrahepatic bile ducts are normal caliber.  There is a moderate volume inflammation surrounding the pancreas. There is fluid along the left and right anterior para renal fascial planes. There is no organized fluid collections associated with the pancreas. Pancreatic duct is normal caliber.  The spleen is normal. Adrenal glands are normal. There is a simple appearing cyst associated with the kidneys.  IMPRESSION: 1. Gallbladder wall thickening, pericholecystic fluid, gallbladder distension, and gallstones within the gallbladder all concern for acute cholecystitis. 2. Moderate peripancreatic fluid is concerning for acute pancreatitis. 3. No evidence of filling defect within the common bile duct to suggest choledocholithiasis. There is no intra or extrahepatic biliary duct dilatation. 4. No organized fluid collections associated pancreatitis. 5. Increasing bibasilar consolidation representing atelectasis versus infiltrates. Findings conveyed toFloor nurse Ericaon 09/07/2014  at19:53.   Electronically Signed   By: Suzy Bouchard M.D.   On: 09/07/2014 19:53   Mr 3d Recon At Scanner  09/07/2014   CLINICAL DATA:  Pancreatitis  EXAM: MRI ABDOMEN WITHOUT CONTRAST  (  INCLUDING MRCP)  TECHNIQUE: Multiplanar multisequence MR imaging of the abdomen was performed. Heavily T2-weighted images of the biliary and pancreatic ducts were obtained, and three-dimensional MRCP images were rendered by post processing.  COMPARISON:   CT 09/05/2014  FINDINGS: There is bibasilar lung atelectasis versus consolidation. Small pleural effusions. These are increased from comparison CT. No pericardial fluid.  No focal hepatic lesion. No significant intrahepatic biliary duct dilatation. The gallbladder is thick-walled at 7 mm in thickness. There is small amount of pericholecystic fluid surrounding the gallbladder. The gallbladder is distended to 5.7 cm. Several dependent gallstones within the lumen of the gallbladder measuring less than 10 mm each. The common bile duct is normal caliber without evidence of filling defect within the common bile duct. No external compression. The intra and extrahepatic bile ducts are normal caliber.  There is a moderate volume inflammation surrounding the pancreas. There is fluid along the left and right anterior para renal fascial planes. There is no organized fluid collections associated with the pancreas. Pancreatic duct is normal caliber.  The spleen is normal. Adrenal glands are normal. There is a simple appearing cyst associated with the kidneys.  IMPRESSION: 1. Gallbladder wall thickening, pericholecystic fluid, gallbladder distension, and gallstones within the gallbladder all concern for acute cholecystitis. 2. Moderate peripancreatic fluid is concerning for acute pancreatitis. 3. No evidence of filling defect within the common bile duct to suggest choledocholithiasis. There is no intra or extrahepatic biliary duct dilatation. 4. No organized fluid collections associated pancreatitis. 5. Increasing bibasilar consolidation representing atelectasis versus infiltrates. Findings conveyed toFloor nurse Ericaon 09/07/2014  at19:53.   Electronically Signed   By: Suzy Bouchard M.D.   On: 09/07/2014 19:53    Anti-infectives: Anti-infectives    Start     Dose/Rate Route Frequency Ordered Stop   09/06/14 0600  piperacillin-tazobactam (ZOSYN) IVPB 3.375 g     3.375 g12.5 mL/hr over 240 Minutes Intravenous 3 times per  day 09/06/14 0021     09/05/14 2245  piperacillin-tazobactam (ZOSYN) IVPB 3.375 g     3.375 g12.5 mL/hr over 240 Minutes Intravenous  Once 09/05/14 2241 09/05/14 2330      Assessment/Plan: Cholelithiasis and recent episode of biliary pancreatitis. His LFTs and lipase are trending down. MR CP shows no obstruction and some evidence of acute cholecystitis, but wbc is normal and pain is improving, so I do not feel it is neccessary at this time. Pt opposed to surgery, and I agree.  Surgery would be very risky and could easily shorten his life and/or decrease his function further.  Agree with Cards.  Would recommend ERCP and sphincterotomy, if feasible, to prevent further attacks.   Will see as needed going forward.  Please call with any questions or concerns.      LOS: 4 days    Krishawn Vanderweele C. [id number]

## 2014-09-10 LAB — COMPREHENSIVE METABOLIC PANEL
ALT: 59 U/L — ABNORMAL HIGH (ref 0–53)
ANION GAP: 13 (ref 5–15)
AST: 22 U/L (ref 0–37)
Albumin: 2.4 g/dL — ABNORMAL LOW (ref 3.5–5.2)
Alkaline Phosphatase: 146 U/L — ABNORMAL HIGH (ref 39–117)
BUN: 18 mg/dL (ref 6–23)
CALCIUM: 9 mg/dL (ref 8.4–10.5)
CO2: 22 mEq/L (ref 19–32)
Chloride: 103 mEq/L (ref 96–112)
Creatinine, Ser: 0.94 mg/dL (ref 0.50–1.35)
GFR calc non Af Amer: 72 mL/min — ABNORMAL LOW (ref >90)
GFR, EST AFRICAN AMERICAN: 84 mL/min — AB (ref >90)
GLUCOSE: 129 mg/dL — AB (ref 70–99)
POTASSIUM: 3.1 meq/L — AB (ref 3.7–5.3)
Sodium: 138 mEq/L (ref 137–147)
TOTAL PROTEIN: 6 g/dL (ref 6.0–8.3)
Total Bilirubin: 1.3 mg/dL — ABNORMAL HIGH (ref 0.3–1.2)

## 2014-09-10 LAB — LIPASE, BLOOD: LIPASE: 78 U/L — AB (ref 11–59)

## 2014-09-10 LAB — CBC
HEMATOCRIT: 28.8 % — AB (ref 39.0–52.0)
Hemoglobin: 9.8 g/dL — ABNORMAL LOW (ref 13.0–17.0)
MCH: 30.7 pg (ref 26.0–34.0)
MCHC: 34 g/dL (ref 30.0–36.0)
MCV: 90.3 fL (ref 78.0–100.0)
Platelets: 236 10*3/uL (ref 150–400)
RBC: 3.19 MIL/uL — ABNORMAL LOW (ref 4.22–5.81)
RDW: 14.4 % (ref 11.5–15.5)
WBC: 6.9 10*3/uL (ref 4.0–10.5)

## 2014-09-10 LAB — GLUCOSE, CAPILLARY
Glucose-Capillary: 113 mg/dL — ABNORMAL HIGH (ref 70–99)
Glucose-Capillary: 115 mg/dL — ABNORMAL HIGH (ref 70–99)
Glucose-Capillary: 150 mg/dL — ABNORMAL HIGH (ref 70–99)

## 2014-09-10 LAB — MAGNESIUM: Magnesium: 1.8 mg/dL (ref 1.5–2.5)

## 2014-09-10 MED ORDER — POTASSIUM CHLORIDE CRYS ER 20 MEQ PO TBCR
40.0000 meq | EXTENDED_RELEASE_TABLET | ORAL | Status: AC
  Administered 2014-09-10 (×2): 40 meq via ORAL
  Filled 2014-09-10 (×3): qty 2

## 2014-09-10 MED ORDER — AMOXICILLIN-POT CLAVULANATE 875-125 MG PO TABS: 1.0000 | ORAL_TABLET | Freq: Two times a day (BID) | ORAL | Status: AC

## 2014-09-10 MED ORDER — FENTANYL CITRATE 0.05 MG/ML IJ SOLN: 50.0000 ug | INTRAMUSCULAR | Status: DC | PRN

## 2014-09-10 MED ORDER — RIVAROXABAN 20 MG PO TABS
20.0000 mg | ORAL_TABLET | Freq: Every day | ORAL | Status: DC
  Administered 2014-09-10: 20 mg via ORAL
  Filled 2014-09-10: qty 1

## 2014-09-10 NOTE — Progress Notes (Signed)
Assumed care of pt at this time. Pt stable with no complaints. Agree with this am's assessment. Will continue to monitor.  Deutsch, Eltha Tingley  

## 2014-09-10 NOTE — Plan of Care (Signed)
Problem: Phase III Progression Outcomes Goal: Activity at appropriate level-compared to baseline (UP IN CHAIR FOR HEMODIALYSIS)  Outcome: Completed/Met Date Met:  09/10/14  Problem: Discharge Progression Outcomes Goal: Pain controlled with appropriate interventions Outcome: Completed/Met Date Met:  09/10/14 Goal: Hemodynamically stable Outcome: Completed/Met Date Met:  09/10/14 Goal: Tolerating diet Outcome: Completed/Met Date Met:  09/10/14

## 2014-09-10 NOTE — Progress Notes (Signed)
    Progress Note   Subjective  no abdominal complaints. Feels okay today   Objective   Vital signs in last 24 hours: Temp:  [98.1 F (36.7 C)-98.5 F (36.9 C)] 98.5 F (36.9 C) (11/10 0550) Pulse Rate:  [60-85] 60 (11/10 0550) Resp:  [16-20] 16 (11/10 0550) BP: (127-137)/(51-71) 137/70 mmHg (11/10 0550) SpO2:  [94 %-99 %] 94 % (11/10 0848) Weight:  [166 lb 8 oz (75.524 kg)] 166 lb 8 oz (75.524 kg) (11/10 0550) Last BM Date: 09/09/14 General:    Pleasant white male in NAD. Very HOH Heart:  Regular rate and rhythm; no murmurs Lungs: Respirations even and unlabored, diminished breath sounds and occas exp wheezing in RLL Abdomen:  Soft, nontender and nondistended. Normal bowel sounds. Extremities:  Without edema. Neurologic:  Alert and oriented,  grossly normal neurologically. Psych:  Cooperative. Normal mood and affect.    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 He may have passed a gallstone. LFTs continue to improve.  Patient still adamantly opposed to sugery. We are agreeable to ERCP with sphincterotomy in an attempt to prevent further episodes of GS pancreatitis. Daughter was going to discuss further with patient and let us know today. I spoke with patient, he isn't interested in any procedures.    LOS: 5 days   Tye Savoy  09/10/2014, 9:23  AM   ________________________________________________________________________  Velora Heckler GI MD note:  I personally examined the patient, reviewed the data and agree with the assessment and plan described above.  Nevin Bloodgood spoke with his daughter( as I did yesterday) and they have decided against any procedures, surgeries currently.  I recommend continuing PO antibiotics for 5-7 days.  Please call or page if any further questions or concerns.   Owens Loffler, MD [facility] Pager [phone]

## 2014-09-10 NOTE — Plan of Care (Signed)
Problem: Discharge Progression Outcomes Goal: Afebrile times 24 hours Outcome: Completed/Met Date Met:  09/10/14 Goal: Amylase, WBC trending downward Outcome: Completed/Met Date Met:  09/10/14 Goal: Pt. accepted/substance abuse rehab Outcome: Not Applicable Date Met:  93/11/21

## 2014-09-10 NOTE — Discharge Summary (Signed)
Physician Discharge Summary  Todd [name]:[id number] DOB: 08-16-1924 DOA: 09/05/2014  PCP: Odette Fraction, MD  Admit date: 09/05/2014 Discharge date: 09/10/2014  Time spent: 70 minutes  Recommendations for Outpatient Follow-up:  #1  Follow-up with [facility] TOM, MD In 1 week. Patient need a compressive metabolic profile done to follow-up on electrolytes renal function and LFTs. Patient also need a CBC done. Patient on either lipase level also done. Patient had refused all procedures or surgeries during the hospitalization.   Discharge Diagnoses:  Principal Problem:   Gallstone pancreatitis Active Problems:   Cholecystitis, acute   HLD (hyperlipidemia)   Essential hypertension   Asthma   Atrial fibrillation   CHF (congestive heart failure)   Hypothyroidism   Preoperative cardiovascular examination   Acute pancreatitis   Elevated LFTs   Pancreatitis, gallstone   Discharge Condition: stable and improved  Diet recommendation: heart healthy  Filed Weights   09/08/14 0500 09/09/14 0416 09/10/14 0550  Weight: 77.3 kg (170 lb 6.7 oz) 75.252 kg (165 lb 14.4 oz) 75.524 kg (166 lb 8 oz)    History of present illness:  Todd [name] is a 80 y.o. male with past medical history of diabetes type 2, atrial fibrillation on Xarelto, asthma, congestive heart failure, hypertension, hyperlipidemia, hypothyroidism, who presented with nausea, vomiting and abdominal pain.  Patient's abdominal pain started in the morning of admission and was associated with fever, chills, nausea, vomiting, but no diarrhea. Patient denied cough, chest pain, shortness of breath, symptoms for UTI, hematuria, hematochezia. Of note the patient had gallstone pancreatitis 10 months ago. He refused cholecystectomy in that admission.   Work up in the ED demonstrates acute pancreatitis with lipase >3000. CT-abd pending. Elevated ALT, AST, ALT and total bilirubin. Leukocytosis with WBC 12.1. Patient is  admitted to inpatient for further evaluate and treatment.   Hospital Course:  Gallstone pancreatitis Patient had presented with epigastric and right upper quadrant abdominal pain with associated fever chills or nausea and emesis. Lipase on admission was noted to be greater than 3000. Patient did have elevated LFTs as well as bilirubin and noted to have a leukocytosis with a white count of 12.1. This was patient's second recurrence this year. Patient had refused cholecystectomy in January 2015.  Abdominal ultrasound which was done showed, bile duct at the upper limits of normal with possible sludge.  Patient was placed on gentle hydration with clinical improvement, on a daily basis. Patient did not have any further abdominal pain, no nausea, no vomiting. Patient's LFTs and lipase trended down during the hospitalization. Patient had also been placed empirically on IV antibiotics. Patient was seen in consultation by GI and general surgery. Patient underwent a MRCP. ERCP was discussed with patient, his daughter and GI and patient refused any procedures or further surgeries. Patient was started on a diet which she tolerated. Patient improved clinically. Patient be discharged home in stable condition and will be discharged on 7 more days of oral Augmentin to complete a course of antibiotic therapy. Patient is to follow-up with PCP as outpatient.  Acute versus chronic Cholecystitis  Abdominal ultrasound showed possible biliary sludge Xarelto was held on admission in anticipation of procedures or surgery. GI was consulted as well as general surgery. General surgery has also been consulted per GI to reassess for possible cholecystectomy. Per cardiology patient is high risk for surgery and ERCP may be the best option. Patient refused surgery or any procedures during the hospitalization. MRCP which was done was consistent with acute cholecystitis.  Cardiology consulted for risk stratification, of course patient  is high risk for surgery, recommended they can get away with ERCP/sphincterotomy alone that will be better choice. Patient was empirically placed on IV antibiotics during the hospitalization. Patient was seen by general surgery and GI discussions were had between patient and his daughter and the specialist however patient refused any procedures or surgeries. Patient improved clinically and be discharged home on 7 days of oral Augmentin. Patient is to follow-up with PCP as outpatient.  Atrial fibrillation Currently rate controlled. Xarelto was held on admission as was thought that patient may have a procedure or lap cholecystectomy. Patient was seen in consultation by cardiology who felt she was high-risk for any surgeries. Patient was followed. Patient decided to have no further surgeries or any further procedures and as such his xarelto will be resumed on discharge.   Chronic diastolic CHF 2-D echo from January 2013 with a EF of 65%. Patient was euvolemic throughout the hospitalization with no edema and no crackles noted on examination. proBNP was elevated. Diuretics were initially held when patient was nothing by mouth and on presentation. Once patient improved and was adequately hydrated his Lasix was resumed. Patient remained asymptomatic throughout the hospitalization.  Well-controlled diabetes mellitus Hemoglobin A1c 6.1. Patient was maintained on a sliding scale insulin throughout the hospitalization.  Hypothyroidism Continued on home regimen of Synthroid.  Hypertension Continued on home regimen of Cozaar, Norvasc  Hyperlipidemia LDL of 78. Statin was held secondary to elevated LFTs.  Asthma Remained stable throughout the hospitalization.  Procedures:  Abdominal ultrasound 09/06/2014  CT abd/pelvis 09/05/14  MRCP 09/07/2014  Chest x-ray 09/05/2014    Consultations:  GI: [facility]: Dr. Hilarie Fredrickson 09/06/2014  [facility] :Dr. Marcello Moores 09/06/2014  Cardiology: Dr. Debara Pickett  09/06/2014    Discharge Exam: Filed Vitals:   09/10/14 1429  BP: 127/70  Pulse: 69  Temp: 98 F (36.7 C)  Resp:     General: NAD Cardiovascular: RRR Respiratory: CTAB  Discharge Instructions You were cared for by a hospitalist during your hospital stay. If you have any questions about your discharge medications or the care you received while you were in the hospital after you are discharged, you can call the unit and asked to speak with the hospitalist on call if the hospitalist that took care of you is not available. Once you are discharged, your primary care physician will handle any further medical issues. Please note that NO REFILLS for any discharge medications will be authorized once you are discharged, as it is imperative that you return to your primary care physician (or establish a relationship with a primary care physician if you do not have one) for your aftercare needs so that they can reassess your need for medications and monitor your lab values.  Discharge Instructions    Diet - low sodium heart healthy    Complete by:  As directed      Discharge instructions    Complete by:  As directed   Follow up with [facility] TOM, MD in 1 week.     Increase activity slowly    Complete by:  As directed           Current Discharge Medication List    START taking these medications   Details  amoxicillin-clavulanate (AUGMENTIN) 875-125 MG per tablet Take 1 tablet by mouth 2 (two) times daily. Take for 1 week. Qty: 14 tablet, Refills: 0      CONTINUE these medications which have NOT CHANGED   Details  amLODipine (NORVASC) 5 MG tablet Take 5 mg by mouth daily.    ferrous sulfate 325 (65 FE) MG tablet Take 325 mg by mouth at bedtime.    !! fluticasone (FLONASE) 50 MCG/ACT nasal spray Place 2 sprays into both nostrils daily.    !! fluticasone (FLONASE) 50 MCG/ACT nasal spray Place 2 sprays into both nostrils daily.    !! Fluticasone-Salmeterol (ADVAIR) 500-50  MCG/DOSE AEPB Inhale 1 puff into the lungs 2 (two) times daily.    !! Fluticasone-Salmeterol (ADVAIR) 500-50 MCG/DOSE AEPB Inhale 1 puff into the lungs 2 (two) times daily.    furosemide (LASIX) 20 MG tablet Take 20 mg by mouth daily.    levothyroxine (SYNTHROID, LEVOTHROID) 25 MCG tablet Take 25 mcg by mouth daily.    losartan (COZAAR) 25 MG tablet Take 25 mg by mouth daily.    omeprazole (PRILOSEC) 40 MG capsule Take 40 mg by mouth daily.    potassium chloride SA (K-DUR,KLOR-CON) 20 MEQ tablet Take 10 mEq by mouth 2 (two) times daily.    pravastatin (PRAVACHOL) 20 MG tablet Take 20 mg by mouth daily.    rivaroxaban (XARELTO) 20 MG TABS tablet Take 20 mg by mouth daily with supper.     !! - Potential duplicate medications found. Please discuss with provider.     No Known Allergies Follow-up Information    Follow up with [facility].   Why:  HHRN/PT/OT/AIDE/SW.   Contact information:   [address] [place] [address] [phone]       Follow up with [facility] TOM, MD. Schedule an appointment as soon as possible for a visit in 1 week.   Specialty:  Family Medicine   Contact information:   [address] [place] [address] [phone]        The results of significant diagnostics from this hospitalization (including imaging, microbiology, ancillary and laboratory) are listed below for reference.    Significant Diagnostic Studies: Ct Abdomen Pelvis W Contrast  09/06/2014   CLINICAL DATA:  Left upper abdominal and right abdominal pain associated with nausea and vomiting. History of gallbladder problems. History of colon cancer and diabetes.  EXAM: CT ABDOMEN AND PELVIS WITH CONTRAST  TECHNIQUE: Multidetector CT imaging of the abdomen and pelvis was performed using the standard protocol following bolus administration of intravenous contrast.  CONTRAST:  176mL OMNIPAQUE IOHEXOL 300 MG/ML  SOLN  COMPARISON:  11/23/2013  FINDINGS:  Mild cardiac enlargement. Coronary artery calcification. Infiltration in both lung bases with bronchial wall thickening suggesting bronchitis.  Gallbladder is distended with mild gallbladder wall thickening. Small stones in the gallbladder. No infiltration in the pericholecystic fat. No bile duct dilatation. Similar appearance to previous study. The liver, spleen, adrenal glands, inferior vena cava, and retroperitoneal lymph nodes are unremarkable. Calcification of abdominal aorta and branch vessels without aneurysm. Multiple renal cysts and parenchymal atrophy. No hydronephrosis. Pancreas is unremarkable with improved inflammatory change since previous study. No residual pseudo cysts. Small esophageal hiatal hernia. Stomach is otherwise unremarkable. Small bowel appear normal. Stool-filled colon without distention or wall thickening. No free air or free fluid in the abdomen.  Pelvis: Appendix is probably surgically absent. Bladder wall is not thickened. Small bladder wall calcifications are present. Prostate gland is enlarged with calcification. Diverticulosis of sigmoid colon without evidence of diverticulitis. No free or loculated pelvic fluid collections. No pelvic mass or lymphadenopathy diffuse bone demineralization and degenerative changes. Multiple vertebral compression fractures consistent with osteoporosis. Appearance is unchanged since  prior study.  IMPRESSION: Infiltration and bronchial wall thickening in the lung bases suggesting bronchitis. Distended gallbladder with cholelithiasis and mild gallbladder wall thickening, similar to prior study. Enlarged prostate gland. Changes of osteoporosis with multiple vertebral compression fractures.   Electronically Signed   By: Lucienne Capers M.D.   On: 09/06/2014 00:06   Mr Abdomen Mrcp Wo Cm  09/07/2014   CLINICAL DATA:  Pancreatitis  EXAM: MRI ABDOMEN WITHOUT CONTRAST  (INCLUDING MRCP)  TECHNIQUE: Multiplanar multisequence MR imaging of the abdomen was  performed. Heavily T2-weighted images of the biliary and pancreatic ducts were obtained, and three-dimensional MRCP images were rendered by post processing.  COMPARISON:  CT 09/05/2014  FINDINGS: There is bibasilar lung atelectasis versus consolidation. Small pleural effusions. These are increased from comparison CT. No pericardial fluid.  No focal hepatic lesion. No significant intrahepatic biliary duct dilatation. The gallbladder is thick-walled at 7 mm in thickness. There is small amount of pericholecystic fluid surrounding the gallbladder. The gallbladder is distended to 5.7 cm. Several dependent gallstones within the lumen of the gallbladder measuring less than 10 mm each. The common bile duct is normal caliber without evidence of filling defect within the common bile duct. No external compression. The intra and extrahepatic bile ducts are normal caliber.  There is a moderate volume inflammation surrounding the pancreas. There is fluid along the left and right anterior para renal fascial planes. There is no organized fluid collections associated with the pancreas. Pancreatic duct is normal caliber.  The spleen is normal. Adrenal glands are normal. There is a simple appearing cyst associated with the kidneys.  IMPRESSION: 1. Gallbladder wall thickening, pericholecystic fluid, gallbladder distension, and gallstones within the gallbladder all concern for acute cholecystitis. 2. Moderate peripancreatic fluid is concerning for acute pancreatitis. 3. No evidence of filling defect within the common bile duct to suggest choledocholithiasis. There is no intra or extrahepatic biliary duct dilatation. 4. No organized fluid collections associated pancreatitis. 5. Increasing bibasilar consolidation representing atelectasis versus infiltrates. Findings conveyed toFloor nurse Ericaon 09/07/2014  at19:53.   Electronically Signed   By: Suzy Bouchard M.D.   On: 09/07/2014 19:53   Mr 3d Recon At Scanner  09/07/2014    CLINICAL DATA:  Pancreatitis  EXAM: MRI ABDOMEN WITHOUT CONTRAST  (INCLUDING MRCP)  TECHNIQUE: Multiplanar multisequence MR imaging of the abdomen was performed. Heavily T2-weighted images of the biliary and pancreatic ducts were obtained, and three-dimensional MRCP images were rendered by post processing.  COMPARISON:  CT 09/05/2014  FINDINGS: There is bibasilar lung atelectasis versus consolidation. Small pleural effusions. These are increased from comparison CT. No pericardial fluid.  No focal hepatic lesion. No significant intrahepatic biliary duct dilatation. The gallbladder is thick-walled at 7 mm in thickness. There is small amount of pericholecystic fluid surrounding the gallbladder. The gallbladder is distended to 5.7 cm. Several dependent gallstones within the lumen of the gallbladder measuring less than 10 mm each. The common bile duct is normal caliber without evidence of filling defect within the common bile duct. No external compression. The intra and extrahepatic bile ducts are normal caliber.  There is a moderate volume inflammation surrounding the pancreas. There is fluid along the left and right anterior para renal fascial planes. There is no organized fluid collections associated with the pancreas. Pancreatic duct is normal caliber.  The spleen is normal. Adrenal glands are normal. There is a simple appearing cyst associated with the kidneys.  IMPRESSION: 1. Gallbladder wall thickening, pericholecystic fluid, gallbladder distension, and gallstones within the  gallbladder all concern for acute cholecystitis. 2. Moderate peripancreatic fluid is concerning for acute pancreatitis. 3. No evidence of filling defect within the common bile duct to suggest choledocholithiasis. There is no intra or extrahepatic biliary duct dilatation. 4. No organized fluid collections associated pancreatitis. 5. Increasing bibasilar consolidation representing atelectasis versus infiltrates. Findings conveyed toFloor nurse  Ericaon 09/07/2014  at19:53.   Electronically Signed   By: Suzy Bouchard M.D.   On: 09/07/2014 19:53   Dg Chest Port 1 View  09/05/2014   CLINICAL DATA:  Fever beginning today. Personal history of colon cancer, asthma, hypertension, atrial fibrillation and congestive heart failure.  EXAM: PORTABLE CHEST - 1 VIEW  COMPARISON:  11/23/2013 and previous  FINDINGS: The heart is at the upper limits of normal in size. There is calcification of the aorta. There is mild chronic scarring in the right mid lung and at both lung bases but no sign of active infiltrate, effusion or collapse.  IMPRESSION: Areas of chronic pulmonary scarring.  No active process evident.   Electronically Signed   By: Nelson Chimes M.D.   On: 09/05/2014 22:41   US Abdomen Limited Ruq  09/06/2014   CLINICAL DATA:  Epigastric abdominal pain. Elevated serum transaminases, hyperbilirubinemia, and elevated serum alkaline phosphatase. Prior history of colon cancer.  EXAM: US ABDOMEN LIMITED - RIGHT UPPER QUADRANT  COMPARISON:  CT abdomen and pelvis yesterday.  FINDINGS: Gallbladder:  Multiple small shadowing gallstones and moderate amount of echogenic sludge. Largest gallstone on the order of 7 mm. Gallbladder wall thickening up to approximately 6 mm. No pericholecystic fluid. Negative sonographic Murphy sign according to the ultrasound technologist.  Common bile duct:  Diameter: Approximately 7 mm.  No visible bile duct stones. Distal duct obscured by duodenum bowel gas.  Liver:  Normal size and echotexture without focal parenchymal abnormality. Patent portal vein with hepatopetal flow.  IMPRESSION: 1. Cholelithiasis, gallbladder sludge, and gallbladder wall thickening. Negative sonographic Murphy sign. Chronic cholecystitis is suspected. 2. Upper normal caliber common bile duct. While no bile duct stone was visible on the current examination, review of yesterday's CT suggests that there may be sludge in the distal common bile duct. 3. Normal  sonographic appearance of the liver.   Electronically Signed   By: Evangeline Dakin M.D.   On: 09/06/2014 09:45    Microbiology: Recent Results (from the past 240 hour(s))  Culture, blood (routine x 2)     Status: None (Preliminary result)   Collection Time: 09/05/14  9:34 PM  Result Value Ref Range Status   Specimen Description BLOOD LEFT HAND  Final   Special Requests BOTTLES DRAWN AEROBIC AND ANAEROBIC 3CC  Final   Culture  Setup Time   Final    09/06/2014 01:00 Performed at [facility]    Culture   Final           BLOOD CULTURE RECEIVED NO GROWTH TO DATE CULTURE WILL BE HELD FOR 5 DAYS BEFORE ISSUING A FINAL NEGATIVE REPORT Performed at [facility]    Report Status PENDING  Incomplete  Culture, blood (routine x 2)     Status: None (Preliminary result)   Collection Time: 09/05/14  9:34 PM  Result Value Ref Range Status   Specimen Description BLOOD RIGHT ANTECUBITAL  Final   Special Requests BOTTLES DRAWN AEROBIC AND ANAEROBIC 3CC  Final   Culture  Setup Time   Final    09/06/2014 01:00 Performed at [facility]   Final  BLOOD CULTURE RECEIVED NO GROWTH TO DATE CULTURE WILL BE HELD FOR 5 DAYS BEFORE ISSUING A FINAL NEGATIVE REPORT Performed at [facility]    Report Status PENDING  Incomplete     Labs: Basic Metabolic Panel:  Recent Labs Lab 09/06/14 0032 09/07/14 1111 09/08/14 0444 09/09/14 0345 09/10/14 0414  NA 138 138 142 145 138  K 4.2 3.6* 3.5* 3.7 3.1*  CL 101 104 106 110 103  CO2 22 23 22 22 22   GLUCOSE 171* 122* 93 85 129*  BUN 26* 23 22 17 18   CREATININE 1.11 1.15 1.10 0.96 0.94  CALCIUM 8.8 8.6 8.7 8.9 9.0  MG  --   --  2.2  --  1.8   Liver Function Tests:  Recent Labs Lab 09/06/14 0032 09/07/14 1111 09/08/14 0444 09/09/14 0345 09/10/14 0414  AST 292* 119* 68* 34 22  ALT 212* 164* 124* 86* 59*  ALKPHOS 145* 124* 132* 144* 146*  BILITOT 1.7* 3.9* 3.3* 1.9* 1.3*  PROT 6.6 5.8* 5.8* 6.2  6.0  ALBUMIN 3.5 2.7* 2.6* 2.4* 2.4*    Recent Labs Lab 09/06/14 0034 09/07/14 1111 09/08/14 0444 09/09/14 0345 09/10/14 0414  LIPASE >3000* 203* 80* 65* 78*   No results for input(s): AMMONIA in the last 168 hours. CBC:  Recent Labs Lab 09/05/14 2134 09/06/14 0032 09/07/14 0500 09/08/14 0444 09/09/14 0345 09/10/14 0414  WBC 12.1* 13.3* 11.5* 9.6 8.5 6.9  NEUTROABS 10.7*  --   --   --   --   --   HGB 12.0* 11.1* 10.3* 9.9* 9.9* 9.8*  HCT 35.5* 32.3* 30.1* 29.4* 29.9* 28.8*  MCV 92.2 91.5 92.6 91.9 93.4 90.3  PLT 255 241 205 208 201 236   Cardiac Enzymes: No results for input(s): CKTOTAL, CKMB, CKMBINDEX, TROPONINI in the last 168 hours. BNP: BNP (last 3 results)  Recent Labs  09/06/14 0033  PROBNP 1947.0*   CBG:  Recent Labs Lab 09/09/14 1200 09/09/14 1647 09/09/14 2113 09/10/14 0700 09/10/14 1209  GLUCAP 73 127* 160* 113* 115*       Signed:  THOMPSON,DANIEL MD [facility] 09/10/2014, 4:25 PM

## 2014-09-12 LAB — CULTURE, BLOOD (ROUTINE X 2)
Culture: NO GROWTH
Culture: NO GROWTH

## 2014-10-04 ENCOUNTER — Other Ambulatory Visit: Payer: Medicare Other

## 2014-10-04 DIAGNOSIS — E785 Hyperlipidemia, unspecified: Secondary | ICD-10-CM

## 2014-10-04 LAB — LIPID PANEL
CHOLESTEROL: 179 mg/dL (ref 0–200)
HDL: 50 mg/dL (ref >39)
LDL Cholesterol: 103 mg/dL — ABNORMAL HIGH (ref 0–99)
TRIGLYCERIDES: 128 mg/dL (ref <150)
Total CHOL/HDL Ratio: 3.6 Ratio
VLDL: 26 mg/dL (ref 0–40)

## 2014-10-07 ENCOUNTER — Encounter: Payer: Self-pay | Admitting: Family Medicine

## 2014-10-16 ENCOUNTER — Telehealth: Payer: Self-pay | Admitting: Family Medicine

## 2014-10-16 NOTE — Telephone Encounter (Signed)
Neoma Laming is calling to say that after getting Todd [name]'s lab results, does he need to come back in to see dr pickard? Please call her at [phone] and let her know what he needs to do

## 2014-10-17 NOTE — Telephone Encounter (Signed)
DTR aware of results

## 2014-10-21 ENCOUNTER — Other Ambulatory Visit: Payer: Self-pay | Admitting: Family Medicine

## 2014-10-23 ENCOUNTER — Telehealth: Payer: Self-pay | Admitting: Family Medicine

## 2014-10-23 MED ORDER — LORAZEPAM 0.5 MG PO TABS: 0.5000 mg | ORAL_TABLET | Freq: Two times a day (BID) | ORAL | Status: DC | PRN

## 2014-10-23 NOTE — Telephone Encounter (Signed)
Pt's dtr called and states that pt's wife just passed away and wanted to know if we could call him in something to help calm him down some. She had been in a nursing home and passed this morning.  [facility]

## 2014-10-23 NOTE — Telephone Encounter (Signed)
Med sent to pharm and pt's dtr aware

## 2014-10-23 NOTE — Telephone Encounter (Signed)
Ativan 0.5mg  twice a day as needed  #30 R 1  Let him know it can make him sleepy, no driving with meds

## 2014-11-19 ENCOUNTER — Other Ambulatory Visit: Payer: Self-pay | Admitting: Family Medicine

## 2014-11-19 NOTE — Telephone Encounter (Signed)
Refill appropriate and filled per protocol. 

## 2014-11-25 ENCOUNTER — Telehealth: Payer: Self-pay | Admitting: Family Medicine

## 2014-11-25 NOTE — Telephone Encounter (Signed)
Patients daughter Doreen Beam is calling to say that he is running fever, she cannot get him out of bed cannot get him in the shower or anything. Would like a call back ASAP  [phone]

## 2014-11-25 NOTE — Telephone Encounter (Signed)
Spoke to daughter and she states that the pt was running a fever yesterday of 99.5-100 and has some sinus problems but nothing major just some drainage and congestion. Pt wants to sleep a lot and she thinks maybe he is depressed d/t his wife dying before Christmas. She is rotating tylenol and motrin and he has had no fever today but she can not get him out of bed. Informed her that we would have to see him that it could be depression and for her to treat his symptoms if he still has with mucinex for cough and congestion. Pt's daughter verbalizes understanding and if she can get him to come in she will call us back to make appt.

## 2014-12-23 ENCOUNTER — Other Ambulatory Visit: Payer: Self-pay | Admitting: Family Medicine

## 2015-01-13 ENCOUNTER — Encounter: Payer: Self-pay | Admitting: Family Medicine

## 2015-01-13 ENCOUNTER — Ambulatory Visit (INDEPENDENT_AMBULATORY_CARE_PROVIDER_SITE_OTHER): Payer: Medicare Other | Admitting: Family Medicine

## 2015-01-13 ENCOUNTER — Telehealth: Payer: Self-pay | Admitting: Family Medicine

## 2015-01-13 VITALS — BP 100/50 | HR 98 | Temp 98.0°F | Resp 12 | Ht 67.0 in | Wt 155.0 lb

## 2015-01-13 DIAGNOSIS — F329 Major depressive disorder, single episode, unspecified: Secondary | ICD-10-CM

## 2015-01-13 DIAGNOSIS — D485 Neoplasm of uncertain behavior of skin: Secondary | ICD-10-CM

## 2015-01-13 DIAGNOSIS — F32A Depression, unspecified: Secondary | ICD-10-CM

## 2015-01-13 MED ORDER — VENLAFAXINE HCL ER 37.5 MG PO CP24: ORAL_CAPSULE | ORAL | Status: DC

## 2015-01-13 NOTE — Telephone Encounter (Signed)
Spoke to daughter and she states that since the passing of her mother her father has been very depressed and was wanting to know if an antidepressant may help him. He will not get up out of bed, he's being spoon feed by aid and/or daughter and he refuses to go out anywhere. She wanted to make you aware before his appointment today.

## 2015-01-13 NOTE — Progress Notes (Signed)
Subjective:    Patient ID: Todd [name], male    DOB: [birth year], 81 y.o.   MRN: [id number]  HPI   since I last saw the patient in December , the patient's wife passed away. Assistant that time he has stopped eating. He has no energy. He sleeps all day long. He sundown's at night. He reports anhedonia. He denies any depression however his daughter and his son-in-law state that he has no energy or desire to even get out of bed and get dressed or take care of himself. They state that they feel the patient is depressed. This is affecting his appetite and he is essentially stopped eating. Daughter is trying to supplement with Ensure as much she can the patient seldom wants to drink any ensure. His blood pressure starting to decline. There is no swelling or evidence that he needs his fluid pill. He also appears slightly dehydrated today on examination.  Past Medical History  Diagnosis Date  . Asthma   . Hypertension   . Diabetes mellitus   . Cancer   . A-fib   . Hyperlipidemia   . Colon cancer   . Shortness of breath   . CHF (congestive heart failure) 10/28/2011  . Forgetfulness   . PVC's (premature ventricular contractions)   . Mild aortic insufficiency    Past Surgical History  Procedure Laterality Date  . Colon surgery      Partial hemecolectomy   . Central line insertion  09/08/2011        Current Outpatient Prescriptions on File Prior to Visit  Medication Sig Dispense Refill  . ADVAIR DISKUS 500-50 MCG/DOSE AEPB INHALE 1 PUFF TWICE A DAY 180 each 1  . amLODipine (NORVASC) 5 MG tablet Take 5 mg by mouth daily.    Marland Kitchen amLODipine (NORVASC) 5 MG tablet TAKE 1 TABLET BY MOUTH DAILY 90 tablet 3  . ferrous sulfate 325 (65 FE) MG tablet Take 325 mg by mouth at bedtime.    . ferrous sulfate 325 (65 FE) MG tablet TAKE 1 TABLET BY MOUTH EVERY DAY 30 tablet 11  . fluticasone (FLONASE) 50 MCG/ACT nasal spray Place 2 sprays into both nostrils daily.    . fluticasone (FLONASE) 50 MCG/ACT  nasal spray Place 2 sprays into both nostrils daily.    . Fluticasone-Salmeterol (ADVAIR) 500-50 MCG/DOSE AEPB Inhale 1 puff into the lungs 2 (two) times daily.    . Fluticasone-Salmeterol (ADVAIR) 500-50 MCG/DOSE AEPB Inhale 1 puff into the lungs 2 (two) times daily.    . furosemide (LASIX) 20 MG tablet Take 20 mg by mouth daily.    Marland Kitchen KLOR-CON M20 20 MEQ tablet TAKE 1/2 TABLET (10 MEQ TOTAL) BY MOUTH 2 (TWO) TIMES DAILY. 3/21 90 tablet 3  . levothyroxine (SYNTHROID, LEVOTHROID) 25 MCG tablet Take 25 mcg by mouth daily.    Marland Kitchen LORazepam (ATIVAN) 0.5 MG tablet Take 1 tablet (0.5 mg total) by mouth 2 (two) times daily as needed for anxiety. 30 tablet 1  . losartan (COZAAR) 25 MG tablet Take 25 mg by mouth daily.    Marland Kitchen omeprazole (PRILOSEC) 40 MG capsule Take 40 mg by mouth daily.    . pravastatin (PRAVACHOL) 20 MG tablet Take 20 mg by mouth daily.    . rivaroxaban (XARELTO) 20 MG TABS tablet Take 20 mg by mouth daily with supper.    Alveda Reasons 20 MG TABS tablet TAKE 1 TABLET BY MOUTH DAILY WITH EVENING MEAL. 31 tablet 9   No current facility-administered medications  on file prior to visit.   No Known Allergies History   Social History  . Marital Status: Married    Spouse Name: N/A  . Number of Children: N/A  . Years of Education: N/A   Occupational History  . Not on file.   Social History Main Topics  . 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 Smokeless tobacco: Not on file     Comment: quit 30+ yrs ago  . Alcohol Use: No  . Drug Use: No  . Sexual Activity: Not on file   Other Topics Concern  . Not on file   Social History Narrative     Review of Systems  All other systems reviewed and are negative.      Objective:   Physical Exam  Cardiovascular: Normal rate, regular rhythm and normal heart sounds.   Pulmonary/Chest: Effort normal. He has wheezes. He has rales.  Abdominal: Soft. Bowel sounds are normal. He exhibits no distension. There is no tenderness. There is no rebound and  no guarding.  Vitals reviewed. Weak, elderly gentleman in a wheelchair.        Assessment & Plan:  Depression - Plan: COMPLETE METABOLIC PANEL WITH GFR, CBC with Differential/Platelet, TSH, venlafaxine XR (EFFEXOR XR) 37.5 MG 24 hr capsule   begin Effexor XR 37.5 mg 1 tablet by mouth every morning for one week and then increase to 75 mg by mouth daily thereafter. I will also check baseline lab work with a CBC, CMP , and TSH. I have recommended the family stop amlodipine, furosemide, and Klor-Con.  Recheck in one month. 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   Family would like me to treat this today. I treated using liquid nitrogen for a total of 30 seconds with cryotherapy. Recommend biopsy /excisional biopsy if persistent

## 2015-01-13 NOTE — Telephone Encounter (Signed)
Patient would like to speak to you before Benjimin's appointment this afternoon  [phone]

## 2015-01-14 LAB — CBC WITH DIFFERENTIAL/PLATELET
BASOS PCT: 1 % (ref 0–1)
Basophils Absolute: 0.1 10*3/uL (ref 0.0–0.1)
Eosinophils Absolute: 0.3 10*3/uL (ref 0.0–0.7)
Eosinophils Relative: 4 % (ref 0–5)
HCT: 31.4 % — ABNORMAL LOW (ref 39.0–52.0)
Hemoglobin: 10.7 g/dL — ABNORMAL LOW (ref 13.0–17.0)
LYMPHS ABS: 1.2 10*3/uL (ref 0.7–4.0)
Lymphocytes Relative: 15 % (ref 12–46)
MCH: 30.7 pg (ref 26.0–34.0)
MCHC: 34.1 g/dL (ref 30.0–36.0)
MCV: 90.2 fL (ref 78.0–100.0)
MONO ABS: 1.3 10*3/uL — AB (ref 0.1–1.0)
MPV: 9.4 fL (ref 8.6–12.4)
Monocytes Relative: 16 % — ABNORMAL HIGH (ref 3–12)
Neutro Abs: 5.3 10*3/uL (ref 1.7–7.7)
Neutrophils Relative %: 64 % (ref 43–77)
Platelets: 434 10*3/uL — ABNORMAL HIGH (ref 150–400)
RBC: 3.48 MIL/uL — ABNORMAL LOW (ref 4.22–5.81)
RDW: 15 % (ref 11.5–15.5)
WBC: 8.3 10*3/uL (ref 4.0–10.5)

## 2015-01-14 LAB — COMPLETE METABOLIC PANEL WITH GFR
ALK PHOS: 84 U/L (ref 39–117)
ALT: 14 U/L (ref 0–53)
AST: 19 U/L (ref 0–37)
Albumin: 3.6 g/dL (ref 3.5–5.2)
BUN: 42 mg/dL — AB (ref 6–23)
CO2: 23 mEq/L (ref 19–32)
Calcium: 9.2 mg/dL (ref 8.4–10.5)
Chloride: 103 mEq/L (ref 96–112)
Creat: 1.12 mg/dL (ref 0.50–1.35)
GFR, Est African American: 67 mL/min
GFR, Est Non African American: 58 mL/min — ABNORMAL LOW
Glucose, Bld: 146 mg/dL — ABNORMAL HIGH (ref 70–99)
Potassium: 4.9 mEq/L (ref 3.5–5.3)
Sodium: 138 mEq/L (ref 135–145)
TOTAL PROTEIN: 6.5 g/dL (ref 6.0–8.3)
Total Bilirubin: 0.4 mg/dL (ref 0.2–1.2)

## 2015-01-14 LAB — TSH: TSH: 3.834 u[IU]/mL (ref 0.350–4.500)

## 2015-02-04 ENCOUNTER — Emergency Department (HOSPITAL_COMMUNITY): Payer: Medicare Other

## 2015-02-04 ENCOUNTER — Inpatient Hospital Stay (HOSPITAL_COMMUNITY)
Admission: EM | Admit: 2015-02-04 | Discharge: 2015-02-08 | DRG: 193 | Disposition: A | Discharge status: Skilled Nursing Facility | Payer: Medicare Other | Attending: Internal Medicine | Admitting: Internal Medicine

## 2015-02-04 ENCOUNTER — Encounter (HOSPITAL_COMMUNITY): Payer: Self-pay | Admitting: Radiology

## 2015-02-04 DIAGNOSIS — E039 Hypothyroidism, unspecified: Secondary | ICD-10-CM | POA: Diagnosis present

## 2015-02-04 DIAGNOSIS — Z66 Do not resuscitate: Secondary | ICD-10-CM | POA: Diagnosis present

## 2015-02-04 DIAGNOSIS — E119 Type 2 diabetes mellitus without complications: Secondary | ICD-10-CM | POA: Diagnosis present

## 2015-02-04 DIAGNOSIS — J9600 Acute respiratory failure, unspecified whether with hypoxia or hypercapnia: Secondary | ICD-10-CM | POA: Diagnosis present

## 2015-02-04 DIAGNOSIS — Z79899 Other long term (current) drug therapy: Secondary | ICD-10-CM

## 2015-02-04 DIAGNOSIS — E785 Hyperlipidemia, unspecified: Secondary | ICD-10-CM | POA: Diagnosis present

## 2015-02-04 DIAGNOSIS — W19XXXA Unspecified fall, initial encounter: Secondary | ICD-10-CM

## 2015-02-04 DIAGNOSIS — J449 Chronic obstructive pulmonary disease, unspecified: Secondary | ICD-10-CM | POA: Diagnosis present

## 2015-02-04 DIAGNOSIS — J189 Pneumonia, unspecified organism: Secondary | ICD-10-CM | POA: Diagnosis present

## 2015-02-04 DIAGNOSIS — F0390 Unspecified dementia without behavioral disturbance: Secondary | ICD-10-CM | POA: Diagnosis present

## 2015-02-04 DIAGNOSIS — Z7951 Long term (current) use of inhaled steroids: Secondary | ICD-10-CM

## 2015-02-04 DIAGNOSIS — E860 Dehydration: Secondary | ICD-10-CM | POA: Diagnosis present

## 2015-02-04 DIAGNOSIS — J45909 Unspecified asthma, uncomplicated: Secondary | ICD-10-CM | POA: Diagnosis present

## 2015-02-04 DIAGNOSIS — I10 Essential (primary) hypertension: Secondary | ICD-10-CM | POA: Diagnosis present

## 2015-02-04 DIAGNOSIS — M4855XA Collapsed vertebra, not elsewhere classified, thoracolumbar region, initial encounter for fracture: Secondary | ICD-10-CM | POA: Diagnosis present

## 2015-02-04 DIAGNOSIS — E43 Unspecified severe protein-calorie malnutrition: Secondary | ICD-10-CM | POA: Insufficient documentation

## 2015-02-04 DIAGNOSIS — Z09 Encounter for follow-up examination after completed treatment for conditions other than malignant neoplasm: Secondary | ICD-10-CM

## 2015-02-04 DIAGNOSIS — R531 Weakness: Secondary | ICD-10-CM

## 2015-02-04 DIAGNOSIS — Z87891 Personal history of nicotine dependence: Secondary | ICD-10-CM

## 2015-02-04 DIAGNOSIS — Z6824 Body mass index (BMI) 24.0-24.9, adult: Secondary | ICD-10-CM

## 2015-02-04 DIAGNOSIS — J181 Lobar pneumonia, unspecified organism: Secondary | ICD-10-CM | POA: Diagnosis not present

## 2015-02-04 DIAGNOSIS — R627 Adult failure to thrive: Secondary | ICD-10-CM

## 2015-02-04 DIAGNOSIS — I482 Chronic atrial fibrillation: Secondary | ICD-10-CM | POA: Diagnosis present

## 2015-02-04 DIAGNOSIS — I5032 Chronic diastolic (congestive) heart failure: Secondary | ICD-10-CM | POA: Diagnosis present

## 2015-02-04 DIAGNOSIS — S0003XA Contusion of scalp, initial encounter: Secondary | ICD-10-CM | POA: Diagnosis present

## 2015-02-04 DIAGNOSIS — Z7901 Long term (current) use of anticoagulants: Secondary | ICD-10-CM

## 2015-02-04 HISTORY — DX: Unspecified dementia, unspecified severity, without behavioral disturbance, psychotic disturbance, mood disturbance, and anxiety: F03.90

## 2015-02-04 HISTORY — DX: Chronic obstructive pulmonary disease, unspecified: J44.9

## 2015-02-04 HISTORY — DX: Unspecified fall, initial encounter: W19.XXXA

## 2015-02-04 HISTORY — DX: Pneumonia, unspecified organism: J18.9

## 2015-02-04 HISTORY — DX: Hypothyroidism, unspecified: E03.9

## 2015-02-04 HISTORY — DX: Adult failure to thrive: R62.7

## 2015-02-04 LAB — CBC WITH DIFFERENTIAL/PLATELET
BASOS ABS: 0 10*3/uL (ref 0.0–0.1)
Basophils Relative: 1 % (ref 0–1)
EOS PCT: 3 % (ref 0–5)
Eosinophils Absolute: 0.2 10*3/uL (ref 0.0–0.7)
HEMATOCRIT: 33 % — AB (ref 39.0–52.0)
Hemoglobin: 11 g/dL — ABNORMAL LOW (ref 13.0–17.0)
Lymphocytes Relative: 35 % (ref 12–46)
Lymphs Abs: 2.4 10*3/uL (ref 0.7–4.0)
MCH: 30.6 pg (ref 26.0–34.0)
MCHC: 33.3 g/dL (ref 30.0–36.0)
MCV: 91.7 fL (ref 78.0–100.0)
MONO ABS: 1.2 10*3/uL — AB (ref 0.1–1.0)
Monocytes Relative: 17 % — ABNORMAL HIGH (ref 3–12)
Neutro Abs: 3.1 10*3/uL (ref 1.7–7.7)
Neutrophils Relative %: 44 % (ref 43–77)
Platelets: 296 10*3/uL (ref 150–400)
RBC: 3.6 MIL/uL — ABNORMAL LOW (ref 4.22–5.81)
RDW: 15.5 % (ref 11.5–15.5)
WBC: 6.8 10*3/uL (ref 4.0–10.5)

## 2015-02-04 LAB — URINALYSIS, ROUTINE W REFLEX MICROSCOPIC
Bilirubin Urine: NEGATIVE
Glucose, UA: NEGATIVE mg/dL
Hgb urine dipstick: NEGATIVE
Ketones, ur: 15 mg/dL — AB
LEUKOCYTES UA: NEGATIVE
Nitrite: NEGATIVE
PH: 7 (ref 5.0–8.0)
Protein, ur: NEGATIVE mg/dL
SPECIFIC GRAVITY, URINE: 1.034 — AB (ref 1.005–1.030)
UROBILINOGEN UA: 1 mg/dL (ref 0.0–1.0)

## 2015-02-04 LAB — I-STAT CHEM 8, ED
BUN: 30 mg/dL — ABNORMAL HIGH (ref 6–23)
CREATININE: 0.8 mg/dL (ref 0.50–1.35)
Calcium, Ion: 1.2 mmol/L (ref 1.13–1.30)
Chloride: 102 mmol/L (ref 96–112)
Glucose, Bld: 119 mg/dL — ABNORMAL HIGH (ref 70–99)
HCT: 34 % — ABNORMAL LOW (ref 39.0–52.0)
HEMOGLOBIN: 11.6 g/dL — AB (ref 13.0–17.0)
POTASSIUM: 4.1 mmol/L (ref 3.5–5.1)
SODIUM: 139 mmol/L (ref 135–145)
TCO2: 22 mmol/L (ref 0–100)

## 2015-02-04 LAB — PROTIME-INR
INR: 1.15 (ref 0.00–1.49)
PROTHROMBIN TIME: 14.9 s (ref 11.6–15.2)

## 2015-02-04 LAB — BASIC METABOLIC PANEL
ANION GAP: 10 (ref 5–15)
BUN: 28 mg/dL — ABNORMAL HIGH (ref 6–23)
CO2: 23 mmol/L (ref 19–32)
Calcium: 9.1 mg/dL (ref 8.4–10.5)
Chloride: 105 mmol/L (ref 96–112)
Creatinine, Ser: 0.87 mg/dL (ref 0.50–1.35)
GFR calc Af Amer: 87 mL/min — ABNORMAL LOW (ref >90)
GFR, EST NON AFRICAN AMERICAN: 75 mL/min — AB (ref >90)
Glucose, Bld: 113 mg/dL — ABNORMAL HIGH (ref 70–99)
POTASSIUM: 4.1 mmol/L (ref 3.5–5.1)
Sodium: 138 mmol/L (ref 135–145)

## 2015-02-04 LAB — I-STAT TROPONIN, ED: TROPONIN I, POC: 0.01 ng/mL (ref 0.00–0.08)

## 2015-02-04 MED ORDER — DEXTROSE 5 % IV SOLN
1.0000 g | Freq: Once | INTRAVENOUS | Status: AC
  Administered 2015-02-05: 1 g via INTRAVENOUS
  Filled 2015-02-04: qty 10

## 2015-02-04 MED ORDER — SODIUM CHLORIDE 0.9 % IV BOLUS (SEPSIS)
1000.0000 mL | Freq: Once | INTRAVENOUS | Status: AC
  Administered 2015-02-04: 1000 mL via INTRAVENOUS

## 2015-02-04 MED ORDER — IOHEXOL 300 MG/ML  SOLN
100.0000 mL | Freq: Once | INTRAMUSCULAR | Status: AC | PRN
  Administered 2015-02-04: 100 mL via INTRAVENOUS

## 2015-02-04 MED ORDER — DEXTROSE 5 % IV SOLN
500.0000 mg | Freq: Once | INTRAVENOUS | Status: AC
  Administered 2015-02-05: 500 mg via INTRAVENOUS
  Filled 2015-02-04: qty 500

## 2015-02-04 NOTE — ED Notes (Signed)
Pt presents with bruising noted to L forehead. Pt fell while at home today and hit head on the corner of a wooden trunk.. Fall was unwitnessed. Pt alert to self and location at baseline.

## 2015-02-04 NOTE — ED Notes (Signed)
Dr.Rancour and Dr.Proulx at bedside updating family on plan of care

## 2015-02-04 NOTE — ED Notes (Signed)
Pt in radiology 

## 2015-02-04 NOTE — ED Provider Notes (Signed)
CSN: [id number]     Arrival date & time 02/04/15  1925 History   First MD Initiated Contact with Patient 02/04/15 1932     Chief Complaint  Patient presents with  . Fall  . Altered Mental Status     (Consider location/radiation/quality/duration/timing/severity/associated sxs/prior Treatment) HPI   81 year old male with past medical history of hypertension, diabetes, A. fib, hyperlipidemia on Xarelto who presents with altered mental status off of his baseline after he suffered a mechanical fall this morning. No cough, no shortness of breath, no known fevers at home, no recent illnesses.  The patient presents with his daughter who notes that over the past few weeks he has gradually had more difficulty with ambulation and also taking less PO.  These sx especially became worse over the past wee.  Past Medical History  Diagnosis Date  . Asthma   . Hypertension   . Diabetes mellitus   . Cancer   . A-fib   . Hyperlipidemia   . Colon cancer   . Shortness of breath   . CHF (congestive heart failure) 10/28/2011  . Forgetfulness   . PVC's (premature ventricular contractions)   . Mild aortic insufficiency    Past Surgical History  Procedure Laterality Date  . Colon surgery      Partial hemecolectomy   . Central line insertion  09/08/2011        Family History  Problem Relation Age of Onset  . Hyperlipidemia Father   . Hypertension Father   . Heart disease Father   . Asthma Father   . Stroke Father   . Heart failure Father   . Heart disease Mother   . Heart failure Mother   . Heart attack Sister    History  Substance Use Topics  . 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 Smokeless tobacco: Not on file     Comment: quit 30+ yrs ago  . Alcohol Use: No    Review of Systems  Constitutional: Negative for fever and chills.  Eyes: Negative for redness.  Respiratory: Negative for cough and shortness of breath.   Cardiovascular: Negative for chest pain.  Gastrointestinal: Negative for  nausea, vomiting, abdominal pain and diarrhea.  Genitourinary: Negative for dysuria.  Musculoskeletal: Positive for back pain.  Skin: Negative for rash.  Neurological: Negative for headaches.  All other systems reviewed and are negative.     Allergies  Review of patient's allergies indicates no known allergies.  Home Medications   Prior to Admission medications   Medication Sig Start Date End Date Taking? Authorizing Provider  ADVAIR DISKUS 500-50 MCG/DOSE AEPB INHALE 1 PUFF TWICE A DAY 12/23/14  Yes Susy Frizzle, MD  ferrous sulfate 325 (65 FE) MG tablet TAKE 1 TABLET BY MOUTH EVERY DAY 12/23/14  Yes Susy Frizzle, MD  fluticasone [facility]) 50 MCG/ACT nasal spray Place 2 sprays into both nostrils daily.   Yes Historical Provider, MD  levothyroxine (SYNTHROID, LEVOTHROID) 25 MCG tablet Take 25 mcg by mouth daily.   Yes Historical Provider, MD  losartan (COZAAR) 50 MG tablet Take 50 mg by mouth daily. 01/23/15  Yes Historical Provider, MD  omeprazole (PRILOSEC) 40 MG capsule Take 40 mg by mouth daily.   Yes Historical Provider, MD  pravastatin (PRAVACHOL) 20 MG tablet Take 20 mg by mouth daily.   Yes Historical Provider, MD  rivaroxaban (XARELTO) 20 MG TABS tablet Take 20 mg by mouth every morning.    Yes Historical Provider, MD  venlafaxine XR (EFFEXOR XR) 37.5  MG 24 hr capsule 2 pills in AM Patient taking differently: Take 75 mg by mouth daily with breakfast. 2 pills in AM 01/13/15  Yes Susy Frizzle, MD  LORazepam (ATIVAN) 0.5 MG tablet Take 1 tablet (0.5 mg total) by mouth 2 (two) times daily as needed for anxiety. 10/23/14   Susy Frizzle, MD   BP 128/76 mmHg  Pulse 81  Temp(Src) 97.8 F (36.6 C) (Oral)  Resp 28  SpO2 100% Physical Exam  Constitutional: No distress.  Patient thin and frail appearing  HENT:  Head: Normocephalic and atraumatic.  Eyes: EOM are normal. Pupils are equal, round, and reactive to light.  Neck: Normal range of motion. Neck supple.   Cardiovascular: Normal rate.   Pulmonary/Chest: Effort normal. No respiratory distress.  Abdominal: Soft. There is no tenderness.  Musculoskeletal: Normal range of motion. He exhibits tenderness (ttp over the t and l spine. ).  Neurological: He is alert. No cranial nerve deficit. GCS eye subscore is 4. GCS verbal subscore is 4. GCS motor subscore is 6.  Oriented to person.  Intact motor function in all four extremities.  Intact sensation in all four ext.  Skin: No rash noted. He is not diaphoretic.  Psychiatric: He has a normal mood and affect.    ED Course  Procedures (including critical care time) Labs Review Labs Reviewed  CBC WITH DIFFERENTIAL/PLATELET - Abnormal; Notable for the following:    RBC 3.60 (*)    Hemoglobin 11.0 (*)    HCT 33.0 (*)    Monocytes Relative 17 (*)    Monocytes Absolute 1.2 (*)    All other components within normal limits  BASIC METABOLIC PANEL - Abnormal; Notable for the following:    Glucose, Bld 113 (*)    BUN 28 (*)    GFR calc non Af Amer 75 (*)    GFR calc Af Amer 87 (*)    All other components within normal limits  URINALYSIS, ROUTINE W REFLEX MICROSCOPIC - Abnormal; Notable for the following:    Specific Gravity, Urine 1.034 (*)    Ketones, ur 15 (*)    All other components within normal limits  I-STAT CHEM 8, ED - Abnormal; Notable for the following:    BUN 30 (*)    Glucose, Bld 119 (*)    Hemoglobin 11.6 (*)    HCT 34.0 (*)    All other components within normal limits  CULTURE, BLOOD (ROUTINE X 2)  CULTURE, BLOOD (ROUTINE X 2)  PROTIME-INR  INFLUENZA PANEL BY PCR (TYPE A & B, H1N1)  I-STAT TROPOININ, ED    Imaging Review Dg Chest 2 View  02/04/2015   CLINICAL DATA:  Pain following fall  EXAM: CHEST  2 VIEW  COMPARISON:  September 05, 2014  FINDINGS: There is subtle interstitial prominence throughout the right upper lobe. Lungs elsewhere are clear. Heart is slightly enlarged with pulmonary vascularity within normal limits. No  adenopathy. There is degenerative change in the thoracic spine. There is anterior wedging of the T12 vertebral body.  IMPRESSION: Diffuse interstitial prominence right upper lobe. Suspect interstitial pneumonitis in this area. Lungs elsewhere clear. Heart mildly enlarged but stable.   Electronically Signed   By: Lowella Grip III M.D.   On: 02/04/2015 20:13   Ct Head Wo Contrast  02/04/2015   CLINICAL DATA:  Fall, headache, altered mental status, left forehead bruising  EXAM: CT HEAD WITHOUT CONTRAST  CT MAXILLOFACIAL WITHOUT CONTRAST  CT CERVICAL SPINE WITHOUT CONTRAST  TECHNIQUE:  Multidetector CT imaging of the head, cervical spine, and maxillofacial structures were performed using the standard protocol without intravenous contrast. Multiplanar CT image reconstructions of the cervical spine and maxillofacial structures were also generated.  COMPARISON:  MRI brain dated 06/19/2011.  CT head dated 06/18/2011.  FINDINGS: CT HEAD FINDINGS  Motion degraded images.  No evidence of parenchymal hemorrhage or extra-axial fluid collection. No mass lesion, mass effect, or midline shift.  No CT evidence of acute infarction.  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  Subcortical white matter and periventricular small vessel ischemic changes. Intracranial atherosclerosis.  Global cortical atrophy.  Secondary ventriculomegaly.  The visualized paranasal sinuses are essentially clear. The mastoid air cells are unopacified.  No evidence of calvarial fracture.  CT MAXILLOFACIAL FINDINGS  Soft tissue swelling/ hematoma overlying the left frontal bone (series 6/ image 85).  No evidence of maxillofacial fracture.  The visualized paranasal sinuses are essentially clear. The mastoid air cells are unopacified.  The bilateral orbits, including the globes and retroconal soft tissues, are within normal limits.  The mandible is intact. Mild anterior subluxation of the left mandibular condyle  relative to the TMJ with associated degenerative changes, chronic.  CT CERVICAL SPINE FINDINGS  Normal cervical lordosis.  No evidence of fracture or dislocation. Vertebral body heights are maintained. Dens appears intact.  No prevertebral soft tissue swelling.  Mild to moderate degenerative changes.  Visualized right thyroid is mildly heterogeneous. Left thyroid is not well visualized.  Visualized lungs are notable for biapical pleural parenchymal scarring.  IMPRESSION: No evidence of acute intracranial abnormality. 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 Atrophy with small vessel ischemic changes.  Soft tissue swelling/hematoma overlying the left frontal bone. No evidence of maxillofacial fracture.  No evidence of traumatic injury to the cervical spine. Mild to moderate degenerative changes.   Electronically Signed   By: Julian Hy M.D.   On: 02/04/2015 22:37   Ct Cervical Spine Wo Contrast  02/04/2015   CLINICAL DATA:  Fall, headache, altered mental status, left forehead bruising  EXAM: CT HEAD WITHOUT CONTRAST  CT MAXILLOFACIAL WITHOUT CONTRAST  CT CERVICAL SPINE WITHOUT CONTRAST  TECHNIQUE: Multidetector CT imaging of the head, cervical spine, and maxillofacial structures were performed using the standard protocol without intravenous contrast. Multiplanar CT image reconstructions of the cervical spine and maxillofacial structures were also generated.  COMPARISON:  MRI brain dated 06/19/2011.  CT head dated 06/18/2011.  FINDINGS: CT HEAD FINDINGS  Motion degraded images.  No evidence of parenchymal hemorrhage or extra-axial fluid collection. No mass lesion, mass effect, or midline shift.  No CT evidence of acute infarction.  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  Subcortical white matter and periventricular small vessel ischemic changes. Intracranial atherosclerosis.  Global cortical atrophy.  Secondary ventriculomegaly.   The visualized paranasal sinuses are essentially clear. The mastoid air cells are unopacified.  No evidence of calvarial fracture.  CT MAXILLOFACIAL FINDINGS  Soft tissue swelling/ hematoma overlying the left frontal bone (series 6/ image 85).  No evidence of maxillofacial fracture.  The visualized paranasal sinuses are essentially clear. The mastoid air cells are unopacified.  The bilateral orbits, including the globes and retroconal soft tissues, are within normal limits.  The mandible is intact. Mild anterior subluxation of the left mandibular condyle relative to the TMJ with associated degenerative changes, chronic.  CT CERVICAL SPINE FINDINGS  Normal cervical lordosis.  No evidence of fracture or dislocation. Vertebral body heights  are maintained. Dens appears intact.  No prevertebral soft tissue swelling.  Mild to moderate degenerative changes.  Visualized right thyroid is mildly heterogeneous. Left thyroid is not well visualized.  Visualized lungs are notable for biapical pleural parenchymal scarring.  IMPRESSION: No evidence of acute intracranial abnormality. 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 Atrophy with small vessel ischemic changes.  Soft tissue swelling/hematoma overlying the left frontal bone. No evidence of maxillofacial fracture.  No evidence of traumatic injury to the cervical spine. Mild to moderate degenerative changes.   Electronically Signed   By: Julian Hy M.D.   On: 02/04/2015 22:37   Ct Abdomen Pelvis W Contrast  02/04/2015   CLINICAL DATA:  Fall with headache and altered mental status.  EXAM: CT ABDOMEN AND PELVIS WITH CONTRAST  TECHNIQUE: Multidetector CT imaging of the abdomen and pelvis was performed using the standard protocol following bolus administration of intravenous contrast.  CONTRAST:  129mL OMNIPAQUE IOHEXOL 300 MG/ML  SOLN  COMPARISON:  09/05/2014  FINDINGS: BODY WALL: No contributory findings.  LOWER CHEST: Bronchial wall  thickening in the lower lungs with intermittent bronchial opacification and diffuse reticulation. No superimposed traumatic findings.  ABDOMEN/PELVIS:  Liver: No evidence of laceration.  Biliary: Cholelithiasis. The gallbladder is is distended and mildly thick walled but it is reassuring that the wall thickening is stable and right upper quadrant edema has improved. On image 30 and there is an apparent high density filling defect within the common bile duct, but this is a neighboring vessel on reformats.  Pancreas: Unremarkable.  Spleen: Unremarkable.  Adrenals: Unremarkable.  Kidneys and ureters: Bilateral renal cortical thinning and cysts. No hydronephrosis or stone.  Bladder: Unremarkable.  Reproductive: Negative for age.  Bowel: Proximal colon resection with enteroenteric anastomosis. There is no bowel obstruction. Small ileocolic region lymph nodes are stable from previous. No inflammatory change.  Retroperitoneum: No mass or adenopathy.  Vascular: No acute abnormality.  OSSEOUS: Remote compression or endplate fractures of T9, T12, L2, L3, L4, and L5. There is an acute superimposed horizontal fracture through the anterior T12 body without further of compression (pre-existing approximately 50% compression) or retropulsion. No acute fracture identified. Osteopenia.  IMPRESSION: 1. Acute on chronic T12 body fracture. No retropulsion or increased compression. 2. No evidence of intra-abdominal injury. 3. Numerous chronic findings are stable from 2015 and noted above.   Electronically Signed   By: Monte Fantasia M.D.   On: 02/04/2015 22:43   Ct Maxillofacial Wo Cm  02/04/2015   CLINICAL DATA:  Fall, headache, altered mental status, left forehead bruising  EXAM: CT HEAD WITHOUT CONTRAST  CT MAXILLOFACIAL WITHOUT CONTRAST  CT CERVICAL SPINE WITHOUT CONTRAST  TECHNIQUE: Multidetector CT imaging of the head, cervical spine, and maxillofacial structures were performed using the standard protocol without intravenous  contrast. Multiplanar CT image reconstructions of the cervical spine and maxillofacial structures were also generated.  COMPARISON:  MRI brain dated 06/19/2011.  CT head dated 06/18/2011.  FINDINGS: CT HEAD FINDINGS  Motion degraded images.  No evidence of parenchymal hemorrhage or extra-axial fluid collection. No mass lesion, mass effect, or midline shift.  No CT evidence of acute infarction.  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  Subcortical white matter and periventricular small vessel ischemic changes. Intracranial atherosclerosis.  Global cortical atrophy.  Secondary ventriculomegaly.  The visualized paranasal sinuses are essentially clear. The mastoid air cells are unopacified.  No evidence of calvarial fracture.  CT MAXILLOFACIAL FINDINGS  Soft tissue swelling/  hematoma overlying the left frontal bone (series 6/ image 85).  No evidence of maxillofacial fracture.  The visualized paranasal sinuses are essentially clear. The mastoid air cells are unopacified.  The bilateral orbits, including the globes and retroconal soft tissues, are within normal limits.  The mandible is intact. Mild anterior subluxation of the left mandibular condyle relative to the TMJ with associated degenerative changes, chronic.  CT CERVICAL SPINE FINDINGS  Normal cervical lordosis.  No evidence of fracture or dislocation. Vertebral body heights are maintained. Dens appears intact.  No prevertebral soft tissue swelling.  Mild to moderate degenerative changes.  Visualized right thyroid is mildly heterogeneous. Left thyroid is not well visualized.  Visualized lungs are notable for biapical pleural parenchymal scarring.  IMPRESSION: No evidence of acute intracranial abnormality. 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 Atrophy with small vessel ischemic changes.  Soft tissue swelling/hematoma overlying the left frontal bone. No evidence of maxillofacial  fracture.  No evidence of traumatic injury to the cervical spine. Mild to moderate degenerative changes.   Electronically Signed   By: Julian Hy M.D.   On: 02/04/2015 22:37     EKG Interpretation None      MDM   Final diagnoses:  None     81 year old male with past medical history of hypertension, diabetes, A. fib, hyperlipidemia on Xarelto who presents with altered mental status off of his baseline after he suffered a mechanical fall this morning.  Daughter says that the patient has also had gradual decline in ambulation and PO intake over the past few weeks.  Exam as above, patient appears frail, VSS with mild tachypnea, normal BS bilat.  Patient oriented to person only.  Patient with intake motor function in all 4 ext.  Will get head CT and CT abdomen/pelvis.  Will get CXR, UA, basic labs  Labs WNL, UA WNL, CXR w/ poss PNA.  Will cover with rocephin/azithro. CT abdomen pelvis shows mult old spinal frx and poss new mild frx.  Patient w/ intact motor/sensory func in the LE bilat.  Attempted to ambulate patient but he is to generally weak at this time.  Given this, feel unsafe to d/c home.  Will admit for generalized weakness, possible placement.  He is very frail and seems to be having failure to thrive.       Jarome Matin, MD 02/05/15 [id number]  Ezequiel Essex, MD 02/05/15 [phone]

## 2015-02-04 NOTE — ED Notes (Addendum)
Per EMS- pt had witnessed fall at 10am today, hit head on a chest in his room. Pts caretaker noticed confusion starting at 21- pt  Was unable to state dogs name, did not know who caretaker was. Pt is oriented tp self, time. Disoriented to location/situation. Pt is responsive, unable to hold legs up when asked. Pt dyspneic. Hx of  A-fib- unable to states if he takes anticoagulants. NAD noted. Bruising and edema noted to face above left eye. Pt c/o of "terrible headache" when asked to rate on 0-10 scale, sts "i can stand it, you know"

## 2015-02-04 NOTE — ED Notes (Signed)
Pt grimacing on movement; primarily worsening pain located in back

## 2015-02-05 ENCOUNTER — Encounter (HOSPITAL_COMMUNITY): Payer: Self-pay | Admitting: General Practice

## 2015-02-05 DIAGNOSIS — Z7951 Long term (current) use of inhaled steroids: Secondary | ICD-10-CM | POA: Diagnosis not present

## 2015-02-05 DIAGNOSIS — J189 Pneumonia, unspecified organism: Secondary | ICD-10-CM | POA: Diagnosis not present

## 2015-02-05 DIAGNOSIS — E119 Type 2 diabetes mellitus without complications: Secondary | ICD-10-CM | POA: Diagnosis present

## 2015-02-05 DIAGNOSIS — J449 Chronic obstructive pulmonary disease, unspecified: Secondary | ICD-10-CM | POA: Diagnosis present

## 2015-02-05 DIAGNOSIS — R531 Weakness: Secondary | ICD-10-CM

## 2015-02-05 DIAGNOSIS — E860 Dehydration: Secondary | ICD-10-CM | POA: Diagnosis present

## 2015-02-05 DIAGNOSIS — I482 Chronic atrial fibrillation: Secondary | ICD-10-CM | POA: Diagnosis present

## 2015-02-05 DIAGNOSIS — R627 Adult failure to thrive: Secondary | ICD-10-CM

## 2015-02-05 DIAGNOSIS — Z66 Do not resuscitate: Secondary | ICD-10-CM | POA: Diagnosis present

## 2015-02-05 DIAGNOSIS — J45909 Unspecified asthma, uncomplicated: Secondary | ICD-10-CM | POA: Diagnosis present

## 2015-02-05 DIAGNOSIS — W19XXXA Unspecified fall, initial encounter: Secondary | ICD-10-CM

## 2015-02-05 DIAGNOSIS — E039 Hypothyroidism, unspecified: Secondary | ICD-10-CM | POA: Diagnosis present

## 2015-02-05 DIAGNOSIS — I5032 Chronic diastolic (congestive) heart failure: Secondary | ICD-10-CM | POA: Diagnosis not present

## 2015-02-05 DIAGNOSIS — W19XXXD Unspecified fall, subsequent encounter: Secondary | ICD-10-CM | POA: Diagnosis not present

## 2015-02-05 DIAGNOSIS — E785 Hyperlipidemia, unspecified: Secondary | ICD-10-CM | POA: Diagnosis present

## 2015-02-05 DIAGNOSIS — S0003XA Contusion of scalp, initial encounter: Secondary | ICD-10-CM | POA: Diagnosis present

## 2015-02-05 DIAGNOSIS — R0600 Dyspnea, unspecified: Secondary | ICD-10-CM | POA: Diagnosis not present

## 2015-02-05 DIAGNOSIS — Z7901 Long term (current) use of anticoagulants: Secondary | ICD-10-CM | POA: Diagnosis not present

## 2015-02-05 DIAGNOSIS — Z87891 Personal history of nicotine dependence: Secondary | ICD-10-CM | POA: Diagnosis not present

## 2015-02-05 DIAGNOSIS — J9600 Acute respiratory failure, unspecified whether with hypoxia or hypercapnia: Secondary | ICD-10-CM | POA: Diagnosis present

## 2015-02-05 DIAGNOSIS — Z6824 Body mass index (BMI) 24.0-24.9, adult: Secondary | ICD-10-CM | POA: Diagnosis not present

## 2015-02-05 DIAGNOSIS — F0390 Unspecified dementia without behavioral disturbance: Secondary | ICD-10-CM | POA: Diagnosis present

## 2015-02-05 DIAGNOSIS — M4855XA Collapsed vertebra, not elsewhere classified, thoracolumbar region, initial encounter for fracture: Secondary | ICD-10-CM | POA: Diagnosis present

## 2015-02-05 DIAGNOSIS — I10 Essential (primary) hypertension: Secondary | ICD-10-CM | POA: Diagnosis present

## 2015-02-05 DIAGNOSIS — E43 Unspecified severe protein-calorie malnutrition: Secondary | ICD-10-CM | POA: Diagnosis present

## 2015-02-05 DIAGNOSIS — J181 Lobar pneumonia, unspecified organism: Secondary | ICD-10-CM | POA: Diagnosis present

## 2015-02-05 DIAGNOSIS — Z79899 Other long term (current) drug therapy: Secondary | ICD-10-CM | POA: Diagnosis not present

## 2015-02-05 LAB — COMPREHENSIVE METABOLIC PANEL
ALK PHOS: 82 U/L (ref 39–117)
ALT: 13 U/L (ref 0–53)
AST: 20 U/L (ref 0–37)
Albumin: 3.1 g/dL — ABNORMAL LOW (ref 3.5–5.2)
Anion gap: 9 (ref 5–15)
BILIRUBIN TOTAL: 0.7 mg/dL (ref 0.3–1.2)
BUN: 22 mg/dL (ref 6–23)
CHLORIDE: 102 mmol/L (ref 96–112)
CO2: 25 mmol/L (ref 19–32)
CREATININE: 0.82 mg/dL (ref 0.50–1.35)
Calcium: 9 mg/dL (ref 8.4–10.5)
GFR calc Af Amer: 89 mL/min — ABNORMAL LOW (ref >90)
GFR, EST NON AFRICAN AMERICAN: 77 mL/min — AB (ref >90)
Glucose, Bld: 102 mg/dL — ABNORMAL HIGH (ref 70–99)
Potassium: 3.6 mmol/L (ref 3.5–5.1)
Sodium: 136 mmol/L (ref 135–145)
Total Protein: 5.8 g/dL — ABNORMAL LOW (ref 6.0–8.3)

## 2015-02-05 LAB — CBC
HCT: 32.8 % — ABNORMAL LOW (ref 39.0–52.0)
Hemoglobin: 10.7 g/dL — ABNORMAL LOW (ref 13.0–17.0)
MCH: 30.2 pg (ref 26.0–34.0)
MCHC: 32.6 g/dL (ref 30.0–36.0)
MCV: 92.7 fL (ref 78.0–100.0)
PLATELETS: 271 10*3/uL (ref 150–400)
RBC: 3.54 MIL/uL — AB (ref 4.22–5.81)
RDW: 15.5 % (ref 11.5–15.5)
WBC: 6.4 10*3/uL (ref 4.0–10.5)

## 2015-02-05 LAB — CBC WITH DIFFERENTIAL/PLATELET
Basophils Absolute: 0 10*3/uL (ref 0.0–0.1)
Basophils Relative: 0 % (ref 0–1)
EOS ABS: 0.2 10*3/uL (ref 0.0–0.7)
EOS PCT: 3 % (ref 0–5)
HCT: 29.9 % — ABNORMAL LOW (ref 39.0–52.0)
HEMOGLOBIN: 9.8 g/dL — AB (ref 13.0–17.0)
LYMPHS ABS: 2 10*3/uL (ref 0.7–4.0)
Lymphocytes Relative: 29 % (ref 12–46)
MCH: 30.1 pg (ref 26.0–34.0)
MCHC: 32.8 g/dL (ref 30.0–36.0)
MCV: 91.7 fL (ref 78.0–100.0)
Monocytes Absolute: 1.2 10*3/uL — ABNORMAL HIGH (ref 0.1–1.0)
Monocytes Relative: 17 % — ABNORMAL HIGH (ref 3–12)
Neutro Abs: 3.5 10*3/uL (ref 1.7–7.7)
Neutrophils Relative %: 51 % (ref 43–77)
Platelets: 275 10*3/uL (ref 150–400)
RBC: 3.26 MIL/uL — AB (ref 4.22–5.81)
RDW: 15.7 % — ABNORMAL HIGH (ref 11.5–15.5)
WBC: 6.9 10*3/uL (ref 4.0–10.5)

## 2015-02-05 LAB — CREATININE, SERUM
CREATININE: 0.84 mg/dL (ref 0.50–1.35)
GFR calc non Af Amer: 76 mL/min — ABNORMAL LOW (ref >90)
GFR, EST AFRICAN AMERICAN: 88 mL/min — AB (ref >90)

## 2015-02-05 LAB — AMMONIA: AMMONIA: 10 umol/L — AB (ref 11–32)

## 2015-02-05 LAB — STREP PNEUMONIAE URINARY ANTIGEN: Strep Pneumo Urinary Antigen: NEGATIVE

## 2015-02-05 LAB — INFLUENZA PANEL BY PCR (TYPE A & B)
H1N1 flu by pcr: NOT DETECTED
Influenza A By PCR: NEGATIVE
Influenza B By PCR: NEGATIVE

## 2015-02-05 LAB — HIV ANTIBODY (ROUTINE TESTING W REFLEX): HIV SCREEN 4TH GENERATION: NONREACTIVE

## 2015-02-05 LAB — BRAIN NATRIURETIC PEPTIDE: B Natriuretic Peptide: 144.1 pg/mL — ABNORMAL HIGH (ref 0.0–100.0)

## 2015-02-05 MED ORDER — VENLAFAXINE HCL ER 75 MG PO CP24
75.0000 mg | ORAL_CAPSULE | Freq: Every day | ORAL | Status: DC
  Administered 2015-02-05 – 2015-02-08 (×4): 75 mg via ORAL
  Filled 2015-02-05 (×5): qty 1

## 2015-02-05 MED ORDER — AZITHROMYCIN 500 MG IV SOLR
500.0000 mg | INTRAVENOUS | Status: DC
  Administered 2015-02-06 – 2015-02-08 (×3): 500 mg via INTRAVENOUS
  Filled 2015-02-05 (×5): qty 500

## 2015-02-05 MED ORDER — HEPARIN SODIUM (PORCINE) 5000 UNIT/ML IJ SOLN: 5000.0000 [IU] | Freq: Three times a day (TID) | INTRAMUSCULAR | Status: DC

## 2015-02-05 MED ORDER — PANTOPRAZOLE SODIUM 40 MG PO TBEC
40.0000 mg | DELAYED_RELEASE_TABLET | Freq: Every day | ORAL | Status: DC
  Administered 2015-02-05 – 2015-02-08 (×4): 40 mg via ORAL
  Filled 2015-02-05 (×3): qty 1

## 2015-02-05 MED ORDER — LEVALBUTEROL HCL 1.25 MG/0.5ML IN NEBU
1.2500 mg | INHALATION_SOLUTION | Freq: Four times a day (QID) | RESPIRATORY_TRACT | Status: DC | PRN
  Filled 2015-02-05: qty 0.5

## 2015-02-05 MED ORDER — ENSURE ENLIVE PO LIQD
237.0000 mL | Freq: Two times a day (BID) | ORAL | Status: DC
  Administered 2015-02-06 – 2015-02-08 (×6): 237 mL via ORAL

## 2015-02-05 MED ORDER — RIVAROXABAN 20 MG PO TABS
20.0000 mg | ORAL_TABLET | Freq: Every day | ORAL | Status: DC
  Administered 2015-02-05 – 2015-02-08 (×4): 20 mg via ORAL
  Filled 2015-02-05 (×4): qty 1

## 2015-02-05 MED ORDER — LEVALBUTEROL HCL 1.25 MG/0.5ML IN NEBU
1.2500 mg | INHALATION_SOLUTION | Freq: Two times a day (BID) | RESPIRATORY_TRACT | Status: DC
  Administered 2015-02-05 – 2015-02-06 (×3): 1.25 mg via RESPIRATORY_TRACT
  Filled 2015-02-05 (×5): qty 0.5

## 2015-02-05 MED ORDER — LEVOTHYROXINE SODIUM 25 MCG PO TABS
25.0000 ug | ORAL_TABLET | Freq: Every day | ORAL | Status: DC
  Administered 2015-02-05 – 2015-02-08 (×4): 25 ug via ORAL
  Filled 2015-02-05 (×5): qty 1

## 2015-02-05 MED ORDER — SODIUM CHLORIDE 0.9 % IV SOLN
INTRAVENOUS | Status: DC
  Administered 2015-02-05 – 2015-02-06 (×3): via INTRAVENOUS

## 2015-02-05 MED ORDER — SENNOSIDES 8.8 MG/5ML PO SYRP
5.0000 mL | ORAL_SOLUTION | Freq: Two times a day (BID) | ORAL | Status: DC
  Administered 2015-02-05 – 2015-02-08 (×6): 5 mL via ORAL
  Filled 2015-02-05 (×7): qty 5

## 2015-02-05 MED ORDER — MOMETASONE FURO-FORMOTEROL FUM 200-5 MCG/ACT IN AERO
2.0000 | INHALATION_SPRAY | Freq: Two times a day (BID) | RESPIRATORY_TRACT | Status: DC
  Administered 2015-02-05 – 2015-02-08 (×7): 2 via RESPIRATORY_TRACT
  Filled 2015-02-05: qty 8.8

## 2015-02-05 MED ORDER — DEXTROSE 5 % IV SOLN
1.0000 g | INTRAVENOUS | Status: DC
  Administered 2015-02-06 – 2015-02-08 (×3): 1 g via INTRAVENOUS
  Filled 2015-02-05 (×5): qty 10

## 2015-02-05 NOTE — H&P (Signed)
Hospitalist Admission History and Physical  Patient name: Todd [name] Medical record number: [id number] Date of birth: Jun 01, 1924 Age: 81 y.o. Gender: male  Primary Care Provider: Odette Fraction, MD  Chief Complaint: CAP, failure to thrive, fall   History of Present Illness:This is a 81 y.o. year old male with significant past medical history of Atrial fibrillation on xarelto, dementia, HTN, COPD, CHF presenting with CAP, failure to thrive, fall. Per family, pt had witnessed fall at home coming out of bed. Was observed by caregiver. + head trauma on night stand. No LOC per report. Progressive swelling over L forehead. Family states that pt has had progressive functional decline since 2023/11/12 which is when pt's wife died. Worsening appetite. Still ambulating w/ cane. This was first witnessed fall.  Presented to ER afebrile, hemodynamically stablem though w/ mild tachypnea. CBC and BMET grossly WNL. CT imaging including abd, pelvis, head c spine negative for any acute abnormality apart from acute on chronic T12 fracture-chronic compressions in T9, T12, L2-L5. Encephalomalacia. Soft tissue swelling s/p fall over L frontal bone. CXR concerning for RUL PNA. EKG w/ ? Sinus rhythm and incomplete BBB vs. afib- HR 80s   Assessment and Plan: Todd [name] is a 81 y.o. year old male presenting with weakness, failure to thrive, CAP   Active Problems:   Weakness   CAP (community acquired pneumonia)   Failure to thrive in adult   1- CAP  -IV rocephin and azithromycin -urine strep, legionella -blood cultures -no hypoxia, resp distress currently  -mild tachypnea -supplemental O2 prn  2- Weakness/Failure to thrive  -likely multifactorial in setting of multiple medical problems and grief reaction (wife died 11-11-14) -check pre-albumin  -vit D -started on effexor by PCP w/in last month  -cont  -PT/OT c/s -follow   3-Atrial fibrillation -rate controlled -cont xarelto  -noted  hematoma on scalp  -no overt signs of excessive bleeding -follow  4- Asthma -mild tachypnea in setting of CAP  -no wheezing, hypoxia -cont advair  -follow  FEN/GI: heart healthy diet  Prophylaxis: xarelto  Disposition: pending further evaluation Code Status: DNR   Patient Active Problem List   Diagnosis Date Noted  . Weakness 02/05/2015  . CAP (community acquired pneumonia) 02/05/2015  . Failure to thrive in adult 02/05/2015  . Pancreatitis, gallstone   . Cholecystitis, acute   . Elevated LFTs   . Preoperative cardiovascular examination   . Acute pancreatitis   . Pancreatitis 09/05/2014  . Hypothyroidism 09/05/2014  . Gallstone pancreatitis 11/23/2013  . CHF (congestive heart failure) 10/28/2011  . Pneumonia 09/06/2011  . Sepsis 09/06/2011  . Chest pain 09/06/2011  . Atrial fibrillation 09/06/2011  . Acute respiratory failure with hypoxia 09/06/2011  . PNEUMONIA, ORGANISM UNSPECIFIED 03/16/2010  . EDEMA 06/17/2008  . HLD (hyperlipidemia) 01/24/2008  . Essential hypertension 01/24/2008  . ALLERGIC RHINITIS 01/24/2008  . Asthma 01/24/2008  . BRONCHIECTASIS 01/24/2008  . Nonspecific (abnormal) findings on radiological and other examination of body structure 01/24/2008  . COLON CANCER 01/24/2008  . CHEST XRAY, ABNORMAL 01/24/2008   Past Medical History: Past Medical History  Diagnosis Date  . Asthma   . Hypertension   . Diabetes mellitus   . Cancer   . A-fib   . Hyperlipidemia   . Colon cancer   . Shortness of breath   . CHF (congestive heart failure) 10/28/2011  . Forgetfulness   . PVC's (premature ventricular contractions)   . Mild aortic insufficiency     Past Surgical History: Past Surgical  History  Procedure Laterality Date  . Colon surgery      Partial hemecolectomy   . Central line insertion  09/08/2011         Social History: History   Social History  . Marital Status: Married    Spouse Name: N/A  . Number of Children: N/A  . Years of  Education: N/A   Social History Main Topics  . 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 Smokeless tobacco: Not on file     Comment: quit 30+ yrs ago  . Alcohol Use: No  . Drug Use: No  . Sexual Activity: Not on file   Other Topics Concern  . None   Social History Narrative    Family History: Family History  Problem Relation Age of Onset  . Hyperlipidemia Father   . Hypertension Father   . Heart disease Father   . Asthma Father   . Stroke Father   . Heart failure Father   . Heart disease Mother   . Heart failure Mother   . Heart attack Sister     Allergies: No Known Allergies  Current Facility-Administered Medications  Medication Dose Route Frequency Provider Last Rate Last Dose  . 0.9 %  sodium chloride infusion   Intravenous Continuous Deneise Lever, MD      . azithromycin [facility]) 500 mg in dextrose 5 % 250 mL IVPB  500 mg Intravenous Once Jarome Matin, MD 250 mL/hr at 02/05/15 0121 500 mg at 02/05/15 0121  . azithromycin (ZITHROMAX) 500 mg in dextrose 5 % 250 mL IVPB  500 mg Intravenous Q24H Deneise Lever, MD      . cefTRIAXone (ROCEPHIN) 1 g in dextrose 5 % 50 mL IVPB  1 g Intravenous Once Jarome Matin, MD 100 mL/hr at 02/05/15 0121 1 g at 02/05/15 0121  . cefTRIAXone (ROCEPHIN) 1 g in dextrose 5 % 50 mL IVPB  1 g Intravenous Q24H Deneise Lever, MD      . levothyroxine (SYNTHROID, LEVOTHROID) tablet 25 mcg  25 mcg Oral Daily Deneise Lever, MD      . mometasone-formoterol  [facility]) 200-5 MCG/ACT inhaler 2 puff  2 puff Inhalation BID Deneise Lever, MD      . pantoprazole (PROTONIX) EC tablet 40 mg  40 mg Oral Daily Deneise Lever, MD      . rivaroxaban [facility]) tablet 20 mg  20 mg Oral q morning - 10a Deneise Lever, MD      . venlafaxine XR (EFFEXOR-XR) 24 hr capsule 75 mg  75 mg Oral Q breakfast Deneise Lever, MD       Current Outpatient Prescriptions  Medication Sig Dispense Refill  . ADVAIR DISKUS 500-50 MCG/DOSE AEPB INHALE 1 PUFF TWICE A DAY 180 each 1   . ferrous sulfate 325 (65 FE) MG tablet TAKE 1 TABLET BY MOUTH EVERY DAY 30 tablet 11  . fluticasone (FLONASE) 50 MCG/ACT nasal spray Place 2 sprays into both nostrils daily.    Marland Kitchen levothyroxine (SYNTHROID, LEVOTHROID) 25 MCG tablet Take 25 mcg by mouth daily.    Marland Kitchen losartan (COZAAR) 50 MG tablet Take 50 mg by mouth daily.  11  . omeprazole (PRILOSEC) 40 MG capsule Take 40 mg by mouth daily.    . pravastatin (PRAVACHOL) 20 MG tablet Take 20 mg by mouth daily.    . rivaroxaban (XARELTO) 20 MG TABS tablet Take 20 mg by mouth every morning.     . venlafaxine XR (EFFEXOR XR) 37.5 MG  24 hr capsule 2 pills in AM (Patient taking differently: Take 75 mg by mouth daily with breakfast. 2 pills in AM) 60 capsule 3  . LORazepam (ATIVAN) 0.5 MG tablet Take 1 tablet (0.5 mg total) by mouth 2 (two) times daily as needed for anxiety. 30 tablet 1   Review Of Systems: 12 point ROS negative except as noted above in HPI.  Physical Exam: Filed Vitals:   02/04/15 2305  BP:   Pulse:   Temp: 97.8 F (36.6 C)  Resp:     General: cooperative and confused HEENT: extra ocular movement intact and L frontal bone hematoma w/ mild swelling-stable  Heart: S1, S2 normal, no murmur, rub or gallop, regular rate and rhythm Lungs: clear to auscultation, no wheezes or rales and unlabored breathing Abdomen: abdomen is soft without significant tenderness, masses, organomegaly or guarding Extremities: extremities normal, atraumatic, no cyanosis or edema Skin:no rashes, as above  Neurology: normal without focal findings  Labs and Imaging: Lab Results  Component Value Date/Time   NA 139 02/04/2015 09:29 PM   K 4.1 02/04/2015 09:29 PM   CL 102 02/04/2015 09:29 PM   CO2 23 02/04/2015 09:20 PM   BUN 30* 02/04/2015 09:29 PM   CREATININE 0.80 02/04/2015 09:29 PM   CREATININE 1.12 01/13/2015 03:07 PM   GLUCOSE 119* 02/04/2015 09:29 PM   Lab Results  Component Value Date   WBC 6.8 02/04/2015   HGB 11.6* 02/04/2015   HCT  34.0* 02/04/2015   MCV 91.7 02/04/2015   PLT 296 02/04/2015   Urinalysis    Component Value Date/Time   COLORURINE YELLOW 02/04/2015 [place] 02/04/2015 2337   LABSPEC 1.034* 02/04/2015 2337   PHURINE 7.0 02/04/2015 [place] 02/04/2015 2337   HGBUR NEGATIVE 02/04/2015 2337   BILIRUBINUR NEGATIVE 02/04/2015 2337   KETONESUR 15* 02/04/2015 [place] 02/04/2015 2337   UROBILINOGEN 1.0 02/04/2015 2337   NITRITE NEGATIVE 02/04/2015 2337   LEUKOCYTESUR NEGATIVE 02/04/2015 2337       Dg Chest 2 View  02/04/2015   CLINICAL DATA:  Pain following fall  EXAM: CHEST  2 VIEW  COMPARISON:  September 05, 2014  FINDINGS: There is subtle interstitial prominence throughout the right upper lobe. Lungs elsewhere are clear. Heart is slightly enlarged with pulmonary vascularity within normal limits. No adenopathy. There is degenerative change in the thoracic spine. There is anterior wedging of the T12 vertebral body.  IMPRESSION: Diffuse interstitial prominence right upper lobe. Suspect interstitial pneumonitis in this area. Lungs elsewhere clear. Heart mildly enlarged but stable.   Electronically Signed   By: Lowella Grip III M.D.   On: 02/04/2015 20:13   Ct Head Wo Contrast  02/04/2015   CLINICAL DATA:  Fall, headache, altered mental status, left forehead bruising  EXAM: CT HEAD WITHOUT CONTRAST  CT MAXILLOFACIAL WITHOUT CONTRAST  CT CERVICAL SPINE WITHOUT CONTRAST  TECHNIQUE: Multidetector CT imaging of the head, cervical spine, and maxillofacial structures were performed using the standard protocol without intravenous contrast. Multiplanar CT image reconstructions of the cervical spine and maxillofacial structures were also generated.  COMPARISON:  MRI brain dated 06/19/2011.  CT head dated 06/18/2011.  FINDINGS: CT HEAD FINDINGS  Motion degraded images.  No evidence of parenchymal hemorrhage or extra-axial fluid collection. No mass lesion, mass effect, or  midline shift.  No CT evidence of acute infarction.  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  Subcortical white matter and periventricular small  vessel ischemic changes. Intracranial atherosclerosis.  Global cortical atrophy.  Secondary ventriculomegaly.  The visualized paranasal sinuses are essentially clear. The mastoid air cells are unopacified.  No evidence of calvarial fracture.  CT MAXILLOFACIAL FINDINGS  Soft tissue swelling/ hematoma overlying the left frontal bone (series 6/ image 85).  No evidence of maxillofacial fracture.  The visualized paranasal sinuses are essentially clear. The mastoid air cells are unopacified.  The bilateral orbits, including the globes and retroconal soft tissues, are within normal limits.  The mandible is intact. Mild anterior subluxation of the left mandibular condyle relative to the TMJ with associated degenerative changes, chronic.  CT CERVICAL SPINE FINDINGS  Normal cervical lordosis.  No evidence of fracture or dislocation. Vertebral body heights are maintained. Dens appears intact.  No prevertebral soft tissue swelling.  Mild to moderate degenerative changes.  Visualized right thyroid is mildly heterogeneous. Left thyroid is not well visualized.  Visualized lungs are notable for biapical pleural parenchymal scarring.  IMPRESSION: No evidence of acute intracranial abnormality. 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 Atrophy with small vessel ischemic changes.  Soft tissue swelling/hematoma overlying the left frontal bone. No evidence of maxillofacial fracture.  No evidence of traumatic injury to the cervical spine. Mild to moderate degenerative changes.   Electronically Signed   By: Julian Hy M.D.   On: 02/04/2015 22:37   Ct Cervical Spine Wo Contrast  02/04/2015   CLINICAL DATA:  Fall, headache, altered mental status, left forehead bruising  EXAM: CT HEAD WITHOUT CONTRAST  CT  MAXILLOFACIAL WITHOUT CONTRAST  CT CERVICAL SPINE WITHOUT CONTRAST  TECHNIQUE: Multidetector CT imaging of the head, cervical spine, and maxillofacial structures were performed using the standard protocol without intravenous contrast. Multiplanar CT image reconstructions of the cervical spine and maxillofacial structures were also generated.  COMPARISON:  MRI brain dated 06/19/2011.  CT head dated 06/18/2011.  FINDINGS: CT HEAD FINDINGS  Motion degraded images.  No evidence of parenchymal hemorrhage or extra-axial fluid collection. No mass lesion, mass effect, or midline shift.  No CT evidence of acute infarction.  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  Subcortical white matter and periventricular small vessel ischemic changes. Intracranial atherosclerosis.  Global cortical atrophy.  Secondary ventriculomegaly.  The visualized paranasal sinuses are essentially clear. The mastoid air cells are unopacified.  No evidence of calvarial fracture.  CT MAXILLOFACIAL FINDINGS  Soft tissue swelling/ hematoma overlying the left frontal bone (series 6/ image 85).  No evidence of maxillofacial fracture.  The visualized paranasal sinuses are essentially clear. The mastoid air cells are unopacified.  The bilateral orbits, including the globes and retroconal soft tissues, are within normal limits.  The mandible is intact. Mild anterior subluxation of the left mandibular condyle relative to the TMJ with associated degenerative changes, chronic.  CT CERVICAL SPINE FINDINGS  Normal cervical lordosis.  No evidence of fracture or dislocation. Vertebral body heights are maintained. Dens appears intact.  No prevertebral soft tissue swelling.  Mild to moderate degenerative changes.  Visualized right thyroid is mildly heterogeneous. Left thyroid is not well visualized.  Visualized lungs are notable for biapical pleural parenchymal scarring.  IMPRESSION: No evidence of acute intracranial abnormality.  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 Atrophy with small vessel ischemic changes.  Soft tissue swelling/hematoma overlying the left frontal bone. No evidence of maxillofacial fracture.  No evidence of traumatic injury to the cervical spine. Mild to moderate degenerative changes.   Electronically Signed  By: Julian Hy M.D.   On: 02/04/2015 22:37   Ct Abdomen Pelvis W Contrast  02/04/2015   CLINICAL DATA:  Fall with headache and altered mental status.  EXAM: CT ABDOMEN AND PELVIS WITH CONTRAST  TECHNIQUE: Multidetector CT imaging of the abdomen and pelvis was performed using the standard protocol following bolus administration of intravenous contrast.  CONTRAST:  153mL OMNIPAQUE IOHEXOL 300 MG/ML  SOLN  COMPARISON:  09/05/2014  FINDINGS: BODY WALL: No contributory findings.  LOWER CHEST: Bronchial wall thickening in the lower lungs with intermittent bronchial opacification and diffuse reticulation. No superimposed traumatic findings.  ABDOMEN/PELVIS:  Liver: No evidence of laceration.  Biliary: Cholelithiasis. The gallbladder is is distended and mildly thick walled but it is reassuring that the wall thickening is stable and right upper quadrant edema has improved. On image 30 and there is an apparent high density filling defect within the common bile duct, but this is a neighboring vessel on reformats.  Pancreas: Unremarkable.  Spleen: Unremarkable.  Adrenals: Unremarkable.  Kidneys and ureters: Bilateral renal cortical thinning and cysts. No hydronephrosis or stone.  Bladder: Unremarkable.  Reproductive: Negative for age.  Bowel: Proximal colon resection with enteroenteric anastomosis. There is no bowel obstruction. Small ileocolic region lymph nodes are stable from previous. No inflammatory change.  Retroperitoneum: No mass or adenopathy.  Vascular: No acute abnormality.  OSSEOUS: Remote compression or endplate fractures of T9, T12, L2, L3, L4, and L5. There is an  acute superimposed horizontal fracture through the anterior T12 body without further of compression (pre-existing approximately 50% compression) or retropulsion. No acute fracture identified. Osteopenia.  IMPRESSION: 1. Acute on chronic T12 body fracture. No retropulsion or increased compression. 2. No evidence of intra-abdominal injury. 3. Numerous chronic findings are stable from 2015 and noted above.   Electronically Signed   By: Monte Fantasia M.D.   On: 02/04/2015 22:43   Ct Maxillofacial Wo Cm  02/04/2015   CLINICAL DATA:  Fall, headache, altered mental status, left forehead bruising  EXAM: CT HEAD WITHOUT CONTRAST  CT MAXILLOFACIAL WITHOUT CONTRAST  CT CERVICAL SPINE WITHOUT CONTRAST  TECHNIQUE: Multidetector CT imaging of the head, cervical spine, and maxillofacial structures were performed using the standard protocol without intravenous contrast. Multiplanar CT image reconstructions of the cervical spine and maxillofacial structures were also generated.  COMPARISON:  MRI brain dated 06/19/2011.  CT head dated 06/18/2011.  FINDINGS: CT HEAD FINDINGS  Motion degraded images.  No evidence of parenchymal hemorrhage or extra-axial fluid collection. No mass lesion, mass effect, or midline shift.  No CT evidence of acute infarction.  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  Subcortical white matter and periventricular small vessel ischemic changes. Intracranial atherosclerosis.  Global cortical atrophy.  Secondary ventriculomegaly.  The visualized paranasal sinuses are essentially clear. The mastoid air cells are unopacified.  No evidence of calvarial fracture.  CT MAXILLOFACIAL FINDINGS  Soft tissue swelling/ hematoma overlying the left frontal bone (series 6/ image 85).  No evidence of maxillofacial fracture.  The visualized paranasal sinuses are essentially clear. The mastoid air cells are unopacified.  The bilateral orbits, including the globes and retroconal soft  tissues, are within normal limits.  The mandible is intact. Mild anterior subluxation of the left mandibular condyle relative to the TMJ with associated degenerative changes, chronic.  CT CERVICAL SPINE FINDINGS  Normal cervical lordosis.  No evidence of fracture or dislocation. Vertebral body heights are maintained. Dens appears intact.  No prevertebral soft tissue swelling.  Mild  to moderate degenerative changes.  Visualized right thyroid is mildly heterogeneous. Left thyroid is not well visualized.  Visualized lungs are notable for biapical pleural parenchymal scarring.  IMPRESSION: No evidence of acute intracranial abnormality. 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 Atrophy with small vessel ischemic changes.  Soft tissue swelling/hematoma overlying the left frontal bone. No evidence of maxillofacial fracture.  No evidence of traumatic injury to the cervical spine. Mild to moderate degenerative changes.   Electronically Signed   By: Julian Hy M.D.   On: 02/04/2015 22:37           Shanda Howells MD  Pager: [phone]

## 2015-02-05 NOTE — Progress Notes (Signed)
Todd [name] [id number] Admission Data: 02/05/2015 3:34 PM Attending Provider: Theodis Blaze, MD  OYD:XAJOINO,[facility]VE[facility] TOM, MD Consults/ Treatment Team:    Todd [name] is a 81 y.o. male patient admitted from ED awake, alert  & orientated  X 3,  DNR, VSS - Blood pressure 142/51, pulse 79, temperature 98.8 F (37.1 C), temperature source Axillary, resp. rate 30, SpO2 94 %., no c/o shortness of breath, no c/o chest pain, no distress noted. Tele # 3 placed and pt is currently running:atrial fibrillation, with ventricular rate of 92.   IV site WDL:  antecubital left, condition patent and no redness with a transparent dsg that's clean dry and intact.  Allergies:  No Known Allergies   Past Medical History  Diagnosis Date  . Asthma   . Hypertension   . Diabetes mellitus   . Cancer   . A-fib   . Hyperlipidemia   . Colon cancer   . Shortness of breath   . CHF (congestive heart failure) 10/28/2011  . Forgetfulness   . PVC's (premature ventricular contractions)   . Mild aortic insufficiency     History:  obtained from the patient.  Pt orientation to unit, room and routine. Information packet given to patient/family and safety video watched.  Admission INP armband ID verified with patient/family, and in place. SR up x 2, fall risk assessment complete with Patient and family verbalizing understanding of risks associated with falls. Pt verbalizes an understanding of how to use the call bell and to call for help before getting out of bed.  Skin has small scabs on BLE and L forehead is bruised and swollen from fall.    Will cont to monitor and assist as needed.  Todd [name] [name], RN 02/05/2015 3:34 PM

## 2015-02-05 NOTE — ED Notes (Signed)
Spoke with admitting Doctor states will add additional order for patient.

## 2015-02-05 NOTE — ED Notes (Signed)
Admitting in to see patient and advises patient can have a regular diet.  Diet ordered

## 2015-02-05 NOTE — Progress Notes (Signed)
Patient ID: Todd [name], male   DOB: 01/27/1924, 81 y.o.   MRN: [id number]  [facility] PROGRESS NOTE  Todd [name] [name]:[id number] DOB: Nov 03, 1923 DOA: 02/04/2015 PCP: Odette Fraction, MD   Brief narrative:    This is the addendum to the admission note, please see deatiled H&P in Dr. Romona Curls admission note.   Pt is 81 yo male with a-fb on xarelto, dementia, COPD, CHF, presented with failure to thrive, cough productive of clear sputum. Pt was not able to provide any history at the time of the admission due to dementia and family reported fall at home as a result of poor oral intake and dehydration. They noted bruising over the left side of the forehead but no active bleeding.   In ED, pt was hemodynamically stable with mild tachypnea and CXR findings concerning for developing PNA in the RUL. Pt started on Rocephin and Zithromax and [facility] asked to admit for further evaluation.   Assessment/Plan:    Active Problems:   Acute respiratory failure - secondary to lobar PNA, CAP, RUL, unknown pathogen at this time - continue Zithromax and Rocephin for now - follow up on sputum culture, urine legionella and strep pneumo  - provide BD's scheduled and as needed - provide oxygen to maintain oxygen saturation > 90%    Progressive failure to thrive - likely secondary to the above and imposed on known dementia and progression of the disease itself - will treat PNA as noted above and will request PT/OT evaluation  - advance diet as pt able to tolerate     Fall - in the setting of progressive failure to thrive - now with acute on chronic T12 compression fracture  - PT evaluation requested  - provide analgesia as needed  - may need to consider holding AC in the setting of recurrent fall - will d/w family when family arrives     Atrial fibrillation  - with HR in 80's on admission - continue xarelto     History of known infarcts - seen on CT head below  - PT/OT as noted above      Hypothyroidism - continue synthroid     Protein calorie malnutrition, non severe  - will ask nutritionist to evaluate   DVT prophylaxis - pt already on Xarelto   Code Status: DNR Family Communication:  plan of care discussed with the patient, no family at bedside at this time Disposition Plan: To be determined once seen by PT and once medically treated   IV access:  Peripheral IV  Procedures and diagnostic studies:    Dg Chest 2 View  02/04/2015  Diffuse interstitial prominence right upper lobe. Suspect interstitial pneumonitis in this area. Lungs elsewhere clear. Heart mildly enlarged but stable.     Ct Head Wo Contrast  02/04/2015  No evidence of acute intracranial abnormality. 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 Atrophy with small vessel ischemic changes.  Soft tissue swelling/hematoma overlying the left frontal bone. No evidence of maxillofacial fracture.  No evidence of traumatic injury to the cervical spine. Mild to moderate degenerative changes.     Ct Abdomen Pelvis W Contrast  02/04/2015   Acute on chronic T12 body fracture. No retropulsion or increased compression. 2. No evidence of intra-abdominal injury. 3. Numerous chronic findings are stable from 2015 and noted above.    Ct Maxillofacial Wo Cm  02/04/2015   No evidence of acute intracranial abnormality. Encephalomalacic changes related to prior left parietal and occipital  infarcts. Atrophy with small vessel ischemic changes.  Soft tissue swelling/hematoma overlying the left frontal bone. No evidence of maxillofacial fracture.  No evidence of traumatic injury to the cervical spine. Mild to moderate degenerative changes.  Medical Consultants:  None  Other Consultants:  PT/OT  IAnti-Infectives:   Zithromax 4/5 --> Rocephin 4/5 -->  Faye Ramsay, MD  [facility] Pager [phone]  If 7PM-7AM, please contact night-coverage [email or web address] Password [email or web address] 02/05/2015, 3:37 PM   LOS: 0 days    HPI/Subjective: No events overnight.   Objective: Filed Vitals:   02/05/15 1000 02/05/15 1041 02/05/15 1200 02/05/15 1253  BP: 154/67 154/67 154/48 142/51  Pulse:  79  79  Temp:  98.6 F (37 C)  98.8 F (37.1 C)  TempSrc:  Axillary  Axillary  Resp: 25 22 27 30   SpO2:  99%  94%    Intake/Output Summary (Last 24 hours) at 02/05/15 1537 Last data filed at 02/04/15 2351  Gross per 24 hour  Intake   1000 ml  Output    425 ml  Net    575 ml    Exam:   General:  Pt is alert, follows commands appropriately, not in acute distress, HOH  Cardiovascular: Irregular rate and rhythm,  no rubs, no gallops  Respiratory: Clear to auscultation bilaterally, no wheezing, rhonchi at bases R > L  Abdomen: Soft, non tender, non distended, bowel sounds present, no guarding  Extremities: pulses DP and PT palpable bilaterally  Neuro: Grossly nonfocal, left forehead hematoma, no bleeding   Data Reviewed: Basic Metabolic Panel:  Recent Labs Lab 02/04/15 2120 02/04/15 2129 02/05/15 0259 02/05/15 0800  NA 138 139  --  136  K 4.1 4.1  --  3.6  CL 105 102  --  102  CO2 23  --   --  25  GLUCOSE 113* 119*  --  102*  BUN 28* 30*  --  22  CREATININE 0.87 0.80 0.84 0.82  CALCIUM 9.1  --   --  9.0   Liver Function Tests:  Recent Labs Lab 02/05/15 0800  AST 20  ALT 13  ALKPHOS 82  BILITOT 0.7  PROT 5.8*  ALBUMIN 3.1*    Recent Labs Lab 02/05/15 0120  AMMONIA 10*   CBC:  Recent Labs Lab 02/04/15 2120 02/04/15 2129 02/05/15 0259 02/05/15 0800  WBC 6.8  --  6.4 6.9  NEUTROABS 3.1  --   --  3.5  HGB 11.0* 11.6* 10.7* 9.8*  HCT 33.0* 34.0* 32.8* 29.9*  MCV 91.7  --  92.7 91.7  PLT 296  --  271 275    Scheduled Meds: . azithromycin  500 mg Intravenous Q24H  . cefTRIAXone (ROCEPHIN)  IV  1 g Intravenous Q24H  . levothyroxine  25 mcg Oral QAC breakfast  . mometasone-formoterol  2 puff Inhalation BID  . pantoprazole  40 mg Oral Daily  . rivaroxaban  20 mg Oral QAC  supper  . venlafaxine XR  75 mg Oral Q breakfast   Continuous Infusions: . sodium chloride 100 mL/hr at 02/05/15 1342

## 2015-02-05 NOTE — Evaluation (Signed)
Physical Therapy Evaluation Patient Details Name: Todd [name] MRN: [id number] DOB: 05-09-1924 Today's Date: 02/05/2015   History of Present Illness  Patient is an 81 y/o male with history of DM, A-fib on xarelto, asthma, [place], HTN, HLD, hypothyroid admtted with fall and FTT.  Clinical Impression  Patient presents with dependency for all mobility due to imbalance, fear, pain and weakness.  He will benefit from skilled PT in the acute setting to decrease burden of care for next venue.  Recommend SNF level rehab at d/c.    Follow Up Recommendations SNF;Supervision/Assistance - 24 hour    Equipment Recommendations  None recommended by PT    Recommendations for Other Services       Precautions / Restrictions Precautions Precautions: Fall Restrictions Weight Bearing Restrictions: No      Mobility  Bed Mobility Overal bed mobility: Needs Assistance Bed Mobility: Rolling;Sidelying to Sit;Sit to Supine Rolling: Mod assist Sidelying to sit: Max assist;HOB elevated   Sit to supine: Mod assist   General bed mobility comments: use of rails and HOB elevated to sit, then assist for LE's to supine  Transfers Overall transfer level: Needs assistance   Transfers: Sit to/from Stand;Stand Pivot Transfers;Squat Pivot Transfers Sit to Stand: Mod assist;Max assist Stand pivot transfers: Mod assist Squat pivot transfers: Mod assist     General transfer comment: assist to stand from bed, first two tries pt with shaking tremor in LE's and had to sit back down, then third try got pt to place hands on walker and slowly pivot feet and walker to sit on BSC, then to get back to bed moved 3:1 closer to bed and pt pivoted without walker mod cues for hand placement and assist  Ambulation/Gait                Stairs            Wheelchair Mobility    Modified Rankin (Stroke Patients Only)       Balance Overall balance assessment: History of Falls;Needs assistance   Sitting  balance-Leahy Scale: Poor Sitting balance - Comments: initially mod assist for balance due to falling left, then after sitting few moments able to sit with close supervision to minguard assist total about 2 minutes   Standing balance support: Bilateral upper extremity supported Standing balance-Leahy Scale: Poor Standing balance comment: very fearful and unsteady on his feet, at times tremors                             Pertinent Vitals/Pain Pain Assessment: Faces Faces Pain Scale: Hurts even more Pain Location: left hip? with mobility Pain Intervention(s): Limited activity within patient's tolerance;Monitored during session    Home Living Family/patient expects to be discharged to:: Skilled nursing facility Living Arrangements: Alone               Additional Comments: patient is poor historian and no family present    Prior Function                 Hand Dominance        Extremity/Trunk Assessment               Lower Extremity Assessment: Difficult to assess due to impaired cognition;Generalized weakness      Cervical / Trunk Assessment: Kyphotic  Communication   Communication: HOH  Cognition Arousal/Alertness: Awake/alert Behavior During Therapy: Anxious Overall Cognitive Status: No family/caregiver present to determine baseline cognitive functioning  General Comments      Exercises        Assessment/Plan    PT Assessment Patient needs continued PT services  PT Diagnosis Generalized weakness;Abnormality of gait   PT Problem List Decreased strength;Decreased mobility;Decreased safety awareness;Decreased activity tolerance;Decreased balance;Decreased knowledge of use of DME;Pain  PT Treatment Interventions DME instruction;Therapeutic exercise;Gait training;Balance training;Functional mobility training;Therapeutic activities;Patient/family education   PT Goals (Current goals can be found in the Care Plan  section) Acute Rehab PT Goals Patient Stated Goal: None stated, pt confused PT Goal Formulation: Patient unable to participate in goal setting Time For Goal Achievement: 02/19/15 Potential to Achieve Goals: Fair    Frequency Min 3X/week   Barriers to discharge Decreased caregiver support      Co-evaluation               End of Session Equipment Utilized During Treatment: Gait belt Activity Tolerance: Patient limited by pain;Patient limited by fatigue Patient left: in bed;with call bell/phone within reach;with bed alarm set;with nursing/sitter in room Nurse Communication: Mobility status    Functional Assessment Tool Used: Clinical Judgement Functional Limitation: Mobility: Walking and moving around Mobility: Walking and Moving Around Current Status (Y6503): At least 60 percent but less than 80 percent impaired, limited or restricted Mobility: Walking and Moving Around Goal Status [phone]): At least 40 percent but less than 60 percent impaired, limited or restricted    Time: 1625-1700 PT Time Calculation (min) (ACUTE ONLY): 35 min   Charges:   PT Evaluation $Initial PT Evaluation Tier I: 1 Procedure PT Treatments $Therapeutic Activity: 8-22 mins   PT G Codes:   PT G-Codes **NOT FOR INPATIENT CLASS** Functional Assessment Tool Used: Clinical Judgement Functional Limitation: Mobility: Walking and moving around Mobility: Walking and Moving Around Current Status (C1275): At least 60 percent but less than 80 percent impaired, limited or restricted Mobility: Walking and Moving Around Goal Status [phone]): At least 40 percent but less than 60 percent impaired, limited or restricted    [facility] 02/05/2015, 5:10 PM  [place], [place] 02/05/2015

## 2015-02-05 NOTE — Progress Notes (Signed)
Received report from [facility] in ED

## 2015-02-05 NOTE — Progress Notes (Signed)
Pts BP elevated. On call provider notified. Awaiting orders.  Filed Vitals:   02/05/15 2243  BP: 170/90  Pulse:   Temp:   Resp:

## 2015-02-05 NOTE — ED Notes (Signed)
Dr. Newton at bedside. 

## 2015-02-05 NOTE — ED Notes (Signed)
Paged admitting Doctor to inquire about additional antibiotics.

## 2015-02-05 NOTE — ED Notes (Signed)
2ND ATTEMPT TO GIVE REPORT

## 2015-02-06 DIAGNOSIS — J189 Pneumonia, unspecified organism: Secondary | ICD-10-CM

## 2015-02-06 DIAGNOSIS — E43 Unspecified severe protein-calorie malnutrition: Secondary | ICD-10-CM

## 2015-02-06 DIAGNOSIS — R627 Adult failure to thrive: Secondary | ICD-10-CM

## 2015-02-06 LAB — CBC
HCT: 32 % — ABNORMAL LOW (ref 39.0–52.0)
Hemoglobin: 10.4 g/dL — ABNORMAL LOW (ref 13.0–17.0)
MCH: 30.1 pg (ref 26.0–34.0)
MCHC: 32.5 g/dL (ref 30.0–36.0)
MCV: 92.8 fL (ref 78.0–100.0)
Platelets: 267 10*3/uL (ref 150–400)
RBC: 3.45 MIL/uL — ABNORMAL LOW (ref 4.22–5.81)
RDW: 15.7 % — ABNORMAL HIGH (ref 11.5–15.5)
WBC: 13.4 10*3/uL — ABNORMAL HIGH (ref 4.0–10.5)

## 2015-02-06 LAB — PREALBUMIN: PREALBUMIN: 17 mg/dL — AB (ref 18.0–45.0)

## 2015-02-06 LAB — LEGIONELLA ANTIGEN, URINE

## 2015-02-06 LAB — BASIC METABOLIC PANEL
ANION GAP: 14 (ref 5–15)
BUN: 16 mg/dL (ref 6–23)
CALCIUM: 9.3 mg/dL (ref 8.4–10.5)
CO2: 17 mmol/L — AB (ref 19–32)
Chloride: 107 mmol/L (ref 96–112)
Creatinine, Ser: 0.87 mg/dL (ref 0.50–1.35)
GFR calc Af Amer: 87 mL/min — ABNORMAL LOW (ref >90)
GFR, EST NON AFRICAN AMERICAN: 75 mL/min — AB (ref >90)
Glucose, Bld: 132 mg/dL — ABNORMAL HIGH (ref 70–99)
Potassium: 4 mmol/L (ref 3.5–5.1)
SODIUM: 138 mmol/L (ref 135–145)

## 2015-02-06 LAB — VITAMIN D 25 HYDROXY (VIT D DEFICIENCY, FRACTURES): Vit D, 25-Hydroxy: 35.1 ng/mL (ref 30.0–100.0)

## 2015-02-06 NOTE — Progress Notes (Signed)
[facility] PROGRESS NOTE  Todd [name] JKD:[id number] DOB: 08/02/1924 DOA: 02/04/2015 PCP: Odette Fraction, MD  Assessment/Plan: 1. Acute respiratory failure secondary to lobar pneumonia. Resume IV antibiotics for another 24 hours.  Blood cultures are negative, urine strep and urinary legionella are negative. Sputum cultures pending.    2. Hypertension: Sub optimal . Resume home meds.    3. Failure to thrive: Secondary to severe malnutrition.  PT /ot EVAL.    4. Fall: Acute on chronic t12 compression fracture.  5. Atrial fibrillation: rate controlled.  Resume xarelto.    Hypothyroidism: Resume synthroid.     Code Status: DNR Family Communication: FAMILY  At bedside Disposition Plan: pending.    Consultants: none  Procedures:  CT abdomen and pelvis.   Antibiotics:  Rocephin and Zithromax.   HPI/Subjective:sleeping comfortably.   Objective: Filed Vitals:   02/06/15 0542  BP: 152/85  Pulse:   Temp:   Resp:     Intake/Output Summary (Last 24 hours) at 02/06/15 1738 Last data filed at 02/06/15 1500  Gross per 24 hour  Intake 2683.17 ml  Output    190 ml  Net 2493.17 ml   Filed Weights   02/06/15 0542  Weight: 69.99 kg (154 lb 4.8 oz)    Exam:   General:  Alert afebrile comfortable  Cardiovascular:s1s2  Respiratory:ctab  Abdomen:soft non tender non distended bowel sounds heard  Musculoskeletal: no pedal edema.   Data Reviewed: Basic Metabolic Panel:  Recent Labs Lab 02/04/15 2120 02/04/15 2129 02/05/15 0259 02/05/15 0800 02/06/15 0551  NA 138 139  --  136 138  K 4.1 4.1  --  3.6 4.0  CL 105 102  --  102 107  CO2 23  --   --  25 17*  GLUCOSE 113* 119*  --  102* 132*  BUN 28* 30*  --  22 16  CREATININE 0.87 0.80 0.84 0.82 0.87  CALCIUM 9.1  --   --  9.0 9.3   Liver Function Tests:  Recent Labs Lab 02/05/15 0800  AST 20  ALT 13  ALKPHOS 82  BILITOT 0.7  PROT 5.8*  ALBUMIN 3.1*   No results for input(s):  LIPASE, AMYLASE in the last 168 hours.  Recent Labs Lab 02/05/15 0120  AMMONIA 10*   CBC:  Recent Labs Lab 02/04/15 2120 02/04/15 2129 02/05/15 0259 02/05/15 0800 02/06/15 0551  WBC 6.8  --  6.4 6.9 13.4*  NEUTROABS 3.1  --   --  3.5  --   HGB 11.0* 11.6* 10.7* 9.8* 10.4*  HCT 33.0* 34.0* 32.8* 29.9* 32.0*  MCV 91.7  --  92.7 91.7 92.8  PLT 296  --  271 275 267   Cardiac Enzymes: No results for input(s): CKTOTAL, CKMB, CKMBINDEX, TROPONINI in the last 168 hours. BNP (last 3 results)  Recent Labs  02/05/15 0105  BNP 144.1*    ProBNP (last 3 results)  Recent Labs  09/06/14 0033  PROBNP 1947.0*    CBG: No results for input(s): GLUCAP in the last 168 hours.  Recent Results (from the past 240 hour(s))  Blood culture (routine x 2)     Status: None (Preliminary result)   Collection Time: 02/04/15 11:50 PM  Result Value Ref Range Status   Specimen Description BLOOD LEFT ARM  Final   Special Requests BOTTLES DRAWN AEROBIC AND ANAEROBIC 5CC  Final   Culture   Final           BLOOD CULTURE RECEIVED NO GROWTH TO DATE  CULTURE WILL BE HELD FOR 5 DAYS BEFORE ISSUING A FINAL NEGATIVE REPORT Performed at [facility]    Report Status PENDING  Incomplete  Blood culture (routine x 2)     Status: None (Preliminary result)   Collection Time: 02/05/15 12:58 AM  Result Value Ref Range Status   Specimen Description BLOOD RIGHT HAND  Final   Special Requests BOTTLES DRAWN AEROBIC ONLY 5CC  Final   Culture   Final           BLOOD CULTURE RECEIVED NO GROWTH TO DATE CULTURE WILL BE HELD FOR 5 DAYS BEFORE ISSUING A FINAL NEGATIVE REPORT Performed at [facility]    Report Status PENDING  Incomplete     Studies: Dg Chest 2 View  02/04/2015   CLINICAL DATA:  Pain following fall  EXAM: CHEST  2 VIEW  COMPARISON:  September 05, 2014  FINDINGS: There is subtle interstitial prominence throughout the right upper lobe. Lungs elsewhere are clear. Heart is slightly enlarged  with pulmonary vascularity within normal limits. No adenopathy. There is degenerative change in the thoracic spine. There is anterior wedging of the T12 vertebral body.  IMPRESSION: Diffuse interstitial prominence right upper lobe. Suspect interstitial pneumonitis in this area. Lungs elsewhere clear. Heart mildly enlarged but stable.   Electronically Signed   By: Lowella Grip III M.D.   On: 02/04/2015 20:13   Ct Head Wo Contrast  02/04/2015   CLINICAL DATA:  Fall, headache, altered mental status, left forehead bruising  EXAM: CT HEAD WITHOUT CONTRAST  CT MAXILLOFACIAL WITHOUT CONTRAST  CT CERVICAL SPINE WITHOUT CONTRAST  TECHNIQUE: Multidetector CT imaging of the head, cervical spine, and maxillofacial structures were performed using the standard protocol without intravenous contrast. Multiplanar CT image reconstructions of the cervical spine and maxillofacial structures were also generated.  COMPARISON:  MRI brain dated 06/19/2011.  CT head dated 06/18/2011.  FINDINGS: CT HEAD FINDINGS  Motion degraded images.  No evidence of parenchymal hemorrhage or extra-axial fluid collection. No mass lesion, mass effect, or midline shift.  No CT evidence of acute infarction.  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  Subcortical white matter and periventricular small vessel ischemic changes. Intracranial atherosclerosis.  Global cortical atrophy.  Secondary ventriculomegaly.  The visualized paranasal sinuses are essentially clear. The mastoid air cells are unopacified.  No evidence of calvarial fracture.  CT MAXILLOFACIAL FINDINGS  Soft tissue swelling/ hematoma overlying the left frontal bone (series 6/ image 85).  No evidence of maxillofacial fracture.  The visualized paranasal sinuses are essentially clear. The mastoid air cells are unopacified.  The bilateral orbits, including the globes and retroconal soft tissues, are within normal limits.  The mandible is intact. Mild  anterior subluxation of the left mandibular condyle relative to the TMJ with associated degenerative changes, chronic.  CT CERVICAL SPINE FINDINGS  Normal cervical lordosis.  No evidence of fracture or dislocation. Vertebral body heights are maintained. Dens appears intact.  No prevertebral soft tissue swelling.  Mild to moderate degenerative changes.  Visualized right thyroid is mildly heterogeneous. Left thyroid is not well visualized.  Visualized lungs are notable for biapical pleural parenchymal scarring.  IMPRESSION: No evidence of acute intracranial abnormality. 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 Atrophy with small vessel ischemic changes.  Soft tissue swelling/hematoma overlying the left frontal bone. No evidence of maxillofacial fracture.  No evidence of traumatic injury to the cervical spine. Mild to moderate degenerative changes.   Electronically Signed  By: Julian Hy M.D.   On: 02/04/2015 22:37   Ct Cervical Spine Wo Contrast  02/04/2015   CLINICAL DATA:  Fall, headache, altered mental status, left forehead bruising  EXAM: CT HEAD WITHOUT CONTRAST  CT MAXILLOFACIAL WITHOUT CONTRAST  CT CERVICAL SPINE WITHOUT CONTRAST  TECHNIQUE: Multidetector CT imaging of the head, cervical spine, and maxillofacial structures were performed using the standard protocol without intravenous contrast. Multiplanar CT image reconstructions of the cervical spine and maxillofacial structures were also generated.  COMPARISON:  MRI brain dated 06/19/2011.  CT head dated 06/18/2011.  FINDINGS: CT HEAD FINDINGS  Motion degraded images.  No evidence of parenchymal hemorrhage or extra-axial fluid collection. No mass lesion, mass effect, or midline shift.  No CT evidence of acute infarction.  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  Subcortical white matter and periventricular small vessel ischemic changes. Intracranial atherosclerosis.   Global cortical atrophy.  Secondary ventriculomegaly.  The visualized paranasal sinuses are essentially clear. The mastoid air cells are unopacified.  No evidence of calvarial fracture.  CT MAXILLOFACIAL FINDINGS  Soft tissue swelling/ hematoma overlying the left frontal bone (series 6/ image 85).  No evidence of maxillofacial fracture.  The visualized paranasal sinuses are essentially clear. The mastoid air cells are unopacified.  The bilateral orbits, including the globes and retroconal soft tissues, are within normal limits.  The mandible is intact. Mild anterior subluxation of the left mandibular condyle relative to the TMJ with associated degenerative changes, chronic.  CT CERVICAL SPINE FINDINGS  Normal cervical lordosis.  No evidence of fracture or dislocation. Vertebral body heights are maintained. Dens appears intact.  No prevertebral soft tissue swelling.  Mild to moderate degenerative changes.  Visualized right thyroid is mildly heterogeneous. Left thyroid is not well visualized.  Visualized lungs are notable for biapical pleural parenchymal scarring.  IMPRESSION: No evidence of acute intracranial abnormality. 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 Atrophy with small vessel ischemic changes.  Soft tissue swelling/hematoma overlying the left frontal bone. No evidence of maxillofacial fracture.  No evidence of traumatic injury to the cervical spine. Mild to moderate degenerative changes.   Electronically Signed   By: Julian Hy M.D.   On: 02/04/2015 22:37   Ct Abdomen Pelvis W Contrast  02/04/2015   CLINICAL DATA:  Fall with headache and altered mental status.  EXAM: CT ABDOMEN AND PELVIS WITH CONTRAST  TECHNIQUE: Multidetector CT imaging of the abdomen and pelvis was performed using the standard protocol following bolus administration of intravenous contrast.  CONTRAST:  158mL OMNIPAQUE IOHEXOL 300 MG/ML  SOLN  COMPARISON:  09/05/2014  FINDINGS: BODY WALL:  No contributory findings.  LOWER CHEST: Bronchial wall thickening in the lower lungs with intermittent bronchial opacification and diffuse reticulation. No superimposed traumatic findings.  ABDOMEN/PELVIS:  Liver: No evidence of laceration.  Biliary: Cholelithiasis. The gallbladder is is distended and mildly thick walled but it is reassuring that the wall thickening is stable and right upper quadrant edema has improved. On image 30 and there is an apparent high density filling defect within the common bile duct, but this is a neighboring vessel on reformats.  Pancreas: Unremarkable.  Spleen: Unremarkable.  Adrenals: Unremarkable.  Kidneys and ureters: Bilateral renal cortical thinning and cysts. No hydronephrosis or stone.  Bladder: Unremarkable.  Reproductive: Negative for age.  Bowel: Proximal colon resection with enteroenteric anastomosis. There is no bowel obstruction. Small ileocolic region lymph nodes are stable from previous. No inflammatory change.  Retroperitoneum: No  mass or adenopathy.  Vascular: No acute abnormality.  OSSEOUS: Remote compression or endplate fractures of T9, T12, L2, L3, L4, and L5. There is an acute superimposed horizontal fracture through the anterior T12 body without further of compression (pre-existing approximately 50% compression) or retropulsion. No acute fracture identified. Osteopenia.  IMPRESSION: 1. Acute on chronic T12 body fracture. No retropulsion or increased compression. 2. No evidence of intra-abdominal injury. 3. Numerous chronic findings are stable from 2015 and noted above.   Electronically Signed   By: Monte Fantasia M.D.   On: 02/04/2015 22:43   Ct Maxillofacial Wo Cm  02/04/2015   CLINICAL DATA:  Fall, headache, altered mental status, left forehead bruising  EXAM: CT HEAD WITHOUT CONTRAST  CT MAXILLOFACIAL WITHOUT CONTRAST  CT CERVICAL SPINE WITHOUT CONTRAST  TECHNIQUE: Multidetector CT imaging of the head, cervical spine, and maxillofacial structures were  performed using the standard protocol without intravenous contrast. Multiplanar CT image reconstructions of the cervical spine and maxillofacial structures were also generated.  COMPARISON:  MRI brain dated 06/19/2011.  CT head dated 06/18/2011.  FINDINGS: CT HEAD FINDINGS  Motion degraded images.  No evidence of parenchymal hemorrhage or extra-axial fluid collection. No mass lesion, mass effect, or midline shift.  No CT evidence of acute infarction.  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  Subcortical white matter and periventricular small vessel ischemic changes. Intracranial atherosclerosis.  Global cortical atrophy.  Secondary ventriculomegaly.  The visualized paranasal sinuses are essentially clear. The mastoid air cells are unopacified.  No evidence of calvarial fracture.  CT MAXILLOFACIAL FINDINGS  Soft tissue swelling/ hematoma overlying the left frontal bone (series 6/ image 85).  No evidence of maxillofacial fracture.  The visualized paranasal sinuses are essentially clear. The mastoid air cells are unopacified.  The bilateral orbits, including the globes and retroconal soft tissues, are within normal limits.  The mandible is intact. Mild anterior subluxation of the left mandibular condyle relative to the TMJ with associated degenerative changes, chronic.  CT CERVICAL SPINE FINDINGS  Normal cervical lordosis.  No evidence of fracture or dislocation. Vertebral body heights are maintained. Dens appears intact.  No prevertebral soft tissue swelling.  Mild to moderate degenerative changes.  Visualized right thyroid is mildly heterogeneous. Left thyroid is not well visualized.  Visualized lungs are notable for biapical pleural parenchymal scarring.  IMPRESSION: No evidence of acute intracranial abnormality. 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 Atrophy with small vessel ischemic changes.  Soft tissue swelling/hematoma overlying  the left frontal bone. No evidence of maxillofacial fracture.  No evidence of traumatic injury to the cervical spine. Mild to moderate degenerative changes.   Electronically Signed   By: Julian Hy M.D.   On: 02/04/2015 22:37    Scheduled Meds: . azithromycin  500 mg Intravenous Q24H  . cefTRIAXone (ROCEPHIN)  IV  1 g Intravenous Q24H  . feeding supplement (ENSURE ENLIVE)  237 mL Oral BID BM  . levalbuterol  1.25 mg Nebulization BID  . levothyroxine  25 mcg Oral QAC breakfast  . mometasone-formoterol  2 puff Inhalation BID  . pantoprazole  40 mg Oral Daily  . rivaroxaban  20 mg Oral QAC supper  . sennosides  5 mL Oral BID  . venlafaxine XR  75 mg Oral Q breakfast   Continuous Infusions: . sodium chloride 50 mL/hr at 02/06/15 9678    Active Problems:   Weakness   CAP (community acquired pneumonia)   Failure to thrive in adult  Fall   Protein-calorie malnutrition, severe    Time spent: 25 minutes    [facility] Pager [phone] If 7PM-7AM, please contact night-coverage at [email or web address], password [facility] 02/06/2015, 5:38 PM  LOS: 1 day

## 2015-02-06 NOTE — Progress Notes (Signed)
Paged on call provider about pts BP 4 times, no response. Will continue to monitor.

## 2015-02-06 NOTE — Evaluation (Signed)
Occupational Therapy Evaluation Patient Details Name: Todd [name] MRN: [id number] DOB: 1924/08/21 Today's Date: 02/06/2015    History of Present Illness Patient is an 81 y/o male with history of DM, A-fib on xarelto, asthma, [place], HTN, HLD, hypothyroid admtted with fall and FTT.   Clinical Impression   PLOF unknown as pt is confused and per chart is poor historian. Per chart, pt is from home after a fall and has a caregiver (daughter?). Pt required max A +2 for bed mobility to change linens and is total A for ADLs at this time, including self-feeding. Pt refusing food by closing his mouth, turning his head. Pt noted to have tremors and slow movement. Recommend SNF at d/c for safety. Pt will benefit from acute OT to address cognition and safety with ADLs.     Follow Up Recommendations  SNF;Supervision/Assistance - 24 hour    Equipment Recommendations  Other (comment) (Defer to SNF)    Recommendations for Other Services       Precautions / Restrictions Precautions Precautions: Fall Precaution Comments: tremors? Restrictions Weight Bearing Restrictions: No      Mobility Bed Mobility Overal bed mobility: Needs Assistance;+2 for physical assistance Bed Mobility: Rolling Rolling: Max assist;+2 for physical assistance         General bed mobility comments: Max A to roll. Pt minimally participative with UEs.   Transfers                           ADL Overall ADL's : Needs assistance/impaired                                       General ADL Comments: Pt was soiled when OT arrived and NT was changeing bedding. Assisted in rolling pt and performing pericare and changing sheets. Pt had diarrhea during linen change and had to again change linens. Pt minimally participative in rolling and crying out in pain? Pt currently requires total A for all ADLs including feeding. Pt rejected food despite multiple attempts. Attempted hand over hand with bite on  fork and pt rejected. Pt did take one bite of pie and managed it well orally before swallowing.      Vision Additional Comments: Not able to assess.           Pertinent Vitals/Pain Pain Assessment: Faces Faces Pain Scale: Hurts even more Pain Location: left hip with rolling? Pain Descriptors / Indicators: Grimacing Pain Intervention(s): Limited activity within patient's tolerance;Monitored during session;Repositioned     Hand Dominance Right   Extremity/Trunk Assessment Upper Extremity Assessment Upper Extremity Assessment: Difficult to assess due to impaired cognition;Generalized weakness (tremors noted in UEs)   Lower Extremity Assessment Lower Extremity Assessment: Defer to PT evaluation   Cervical / Trunk Assessment Cervical / Trunk Assessment: Kyphotic   Communication Communication Communication: HOH   Cognition Arousal/Alertness: Awake/alert Behavior During Therapy: Flat affect;Anxious Overall Cognitive Status: No family/caregiver present to determine baseline cognitive functioning Area of Impairment: Following commands       Following Commands: Follows one step commands inconsistently;Follows one step commands with increased time       General Comments: Pt was minimally verbal during session except for crying out in pain when rolling to clean bed. Pt able to follow simple commands with increased time about 50% of the time (squeeze my hand, touch your nose).  Home Living Family/patient expects to be discharged to:: Skilled nursing facility                                 Additional Comments: no family present. pt with difficulty verbalizing today and per chart is poor historian      Prior Functioning/Environment          Comments: PLOF unknown as no family present.     OT Diagnosis: Generalized weakness;Cognitive deficits;Acute pain;Altered mental status   OT Problem List: Decreased strength;Decreased range of  motion;Decreased activity tolerance;Impaired balance (sitting and/or standing);Decreased coordination;Decreased cognition;Decreased safety awareness;Pain   OT Treatment/Interventions: Self-care/ADL training;Therapeutic exercise;Neuromuscular education;DME and/or AE instruction;Energy conservation;Therapeutic activities;Cognitive remediation/compensation;Patient/family education;Balance training    OT Goals(Current goals can be found in the care plan section) Acute Rehab OT Goals Patient Stated Goal: None stated, pt confused  OT Frequency: Min 1X/week    End of Session  Activity Tolerance: Patient limited by pain;Patient limited by fatigue Patient left: in bed;with call bell/phone within reach;with bed alarm set   Time: 0940-7680 OT Time Calculation (min): 20 min Charges:  OT General Charges $OT Visit: 1 Procedure OT Evaluation $Initial OT Evaluation Tier I: 1 Procedure G-Codes:    Juluis Rainier 02-09-2015, 2:03 PM  Cyndie Chime, OTR/L Occupational Therapist [phone] (pager)

## 2015-02-06 NOTE — Progress Notes (Signed)
INITIAL NUTRITION ASSESSMENT  DOCUMENTATION CODES Per approved criteria  -Severe malnutrition in the context of chronic illness   Pt meets criteria for severe MALNUTRITION in the context of chronic illness as evidenced by severe fat and muscle depletion.  INTERVENTION: Continue Ensure Enlive po BID, each supplement provides 350 kcal and 20 grams of protein  NUTRITION DIAGNOSIS: Malnutrition related to inadequate oral intake as evidenced by severe fat and muscle depletion.   Goal: Pt will meet >90% of estimated nutritional needs  Monitor:  PO/supplement intake, labs, weight changes, I/O's  Reason for Assessment: Consult to assess needs  81 y.o. 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 Pt was not able to provide any history at the time of the admission due to dementia and family reported fall at home as a result of poor oral intake and dehydration. They noted bruising over the left side of the forehead but no active bleeding.  ASSESSMENT: Pt admitted s/p fall and with adult FTT.  No family at bedside Pt unable to participate in interview due to cognitive deficit. Per chart review, pt with general decline since Nov 04, 2014, when his wife passed away. Wt hx reveals wt stability over the past year.  Appetite has been poor since admission. Noted 25% meal completion on a regular diet. Pt is also receiving Ensure supplement. RD agrees with supplement. Suspect pt has had poor po intake PTA. Labs reviewed. CO2: 17, Glucose: 132.   Nutrition Focused Physical Exam:  Subcutaneous Fat:  Orbital Region: severe depletion Upper Arm Region: severe depletion Thoracic and Lumbar Region: moderate depletion  Muscle:  Temple Region: severe depletion Clavicle Bone Region: severe depletion Clavicle and Acromion Bone Region: severe depletion Scapular Bone  Region: severe depletion Dorsal Hand: severe depletion Patellar Region: moderate depletion Anterior Thigh Region: moderate deletion Posterior Calf Region: moderate depletion  Edema: none predet  Height: Ht Readings from Last 1 Encounters:  01/13/15 5\' 7"  (1.702 m)    Weight: Wt Readings from Last 1 Encounters:  02/06/15 154 lb 4.8 oz (69.99 kg)    Ideal Body Weight: 148#  % Ideal Body Weight: 104%  Wt Readings from Last 10 Encounters:  02/06/15 154 lb 4.8 oz (69.99 kg)  01/13/15 155 lb (70.308 kg)  09/10/14 166 lb 8 oz (75.524 kg)  09/07/14 173 lb (78.472 kg)  09/07/14 173 lb (78.472 kg)  09/05/14 163 lb (73.936 kg)  12/06/13 157 lb (71.215 kg)  11/25/13 161 lb 13.1 oz (73.4 kg)  08/28/13 156 lb (70.761 kg)  02/02/13 155 lb (70.308 kg)    Usual Body Weight: 157#  % Usual Body Weight: 98%  BMI:  Body mass index is 24.16 kg/(m^2). Normal weight change  Estimated Nutritional Needs: Kcal: 1750-1950 Protein: 75-85 grams Fluid: 1.8-2.0 L  Skin: Intact  Diet Order: Diet regular Room service appropriate?: Yes; Fluid consistency:: Thin  EDUCATION NEEDS: -Education not appropriate at this time   Intake/Output Summary (Last 24 hours) at 02/06/15 1601 Last data filed at 02/06/15 1500  Gross per 24 hour  Intake 2683.17 ml  Output    190 ml  Net 2493.17 ml    Last BM: 02/06/15   Labs:   Recent Labs Lab 02/04/15 2120 02/04/15 2129 02/05/15 0259 02/05/15 0800 02/06/15 0551  NA 138 139  --  136 138  K 4.1 4.1  --  3.6 4.0  CL  105 102  --  102 107  CO2 23  --   --  25 17*  BUN 28* 30*  --  22 16  CREATININE 0.87 0.80 0.84 0.82 0.87  CALCIUM 9.1  --   --  9.0 9.3  GLUCOSE 113* 119*  --  102* 132*    CBG (last 3)  No results for input(s): GLUCAP in the last 72 hours.  Scheduled Meds: . azithromycin  500 mg Intravenous Q24H  . cefTRIAXone (ROCEPHIN)  IV  1 g Intravenous Q24H  . feeding supplement (ENSURE ENLIVE)  237 mL Oral BID BM  . levalbuterol   1.25 mg Nebulization BID  . levothyroxine  25 mcg Oral QAC breakfast  . mometasone-formoterol  2 puff Inhalation BID  . pantoprazole  40 mg Oral Daily  . rivaroxaban  20 mg Oral QAC supper  . sennosides  5 mL Oral BID  . venlafaxine XR  75 mg Oral Q breakfast    Continuous Infusions: . sodium chloride 50 mL/hr at 02/06/15 7001    Past Medical History  Diagnosis Date  . Asthma   . Hypertension   . A-fib   . Hyperlipidemia   . Shortness of breath   . CHF (congestive heart failure) 10/28/2011  . Forgetfulness   . PVC's (premature ventricular contractions)   . Mild aortic insufficiency   . Dementia     Todd [name] 02/05/2015  . Failure to thrive in adult     /notes 02/05/2015  . COPD (chronic obstructive pulmonary disease)   . Hypothyroidism     Todd [name] 02/05/2015  . Fall 02/04/2015    with altered mental status off of his baseline after he suffered a mechanical fall /notes 02/04/2015  . CAP (community acquired pneumonia)     Todd [name] 02/05/2015  . Colon cancer     Past Surgical History  Procedure Laterality Date  . Colon surgery      Partial hemecolectomy   . Central line insertion  09/08/2011         Mitul Hallowell A. Jimmye Norman, RD, LDN, CDE Pager: [phone] After hours Pager: [phone]

## 2015-02-07 ENCOUNTER — Inpatient Hospital Stay (HOSPITAL_COMMUNITY): Payer: Medicare Other

## 2015-02-07 ENCOUNTER — Encounter (HOSPITAL_COMMUNITY): Payer: Self-pay

## 2015-02-07 DIAGNOSIS — R0600 Dyspnea, unspecified: Secondary | ICD-10-CM

## 2015-02-07 LAB — CBC
HEMATOCRIT: 30.7 % — AB (ref 39.0–52.0)
Hemoglobin: 10.1 g/dL — ABNORMAL LOW (ref 13.0–17.0)
MCH: 30.5 pg (ref 26.0–34.0)
MCHC: 32.9 g/dL (ref 30.0–36.0)
MCV: 92.7 fL (ref 78.0–100.0)
Platelets: 264 10*3/uL (ref 150–400)
RBC: 3.31 MIL/uL — AB (ref 4.22–5.81)
RDW: 15.9 % — AB (ref 11.5–15.5)
WBC: 10.1 10*3/uL (ref 4.0–10.5)

## 2015-02-07 LAB — BASIC METABOLIC PANEL
ANION GAP: 5 (ref 5–15)
BUN: 15 mg/dL (ref 6–23)
CO2: 26 mmol/L (ref 19–32)
Calcium: 8.7 mg/dL (ref 8.4–10.5)
Chloride: 107 mmol/L (ref 96–112)
Creatinine, Ser: 0.9 mg/dL (ref 0.50–1.35)
GFR calc Af Amer: 86 mL/min — ABNORMAL LOW (ref >90)
GFR calc non Af Amer: 74 mL/min — ABNORMAL LOW (ref >90)
Glucose, Bld: 151 mg/dL — ABNORMAL HIGH (ref 70–99)
POTASSIUM: 3.8 mmol/L (ref 3.5–5.1)
SODIUM: 138 mmol/L (ref 135–145)

## 2015-02-07 MED ORDER — FUROSEMIDE 10 MG/ML IJ SOLN: 40.0000 mg | Freq: Once | INTRAMUSCULAR | Status: DC

## 2015-02-07 MED ORDER — ENSURE ENLIVE PO LIQD: 237.0000 mL | Freq: Two times a day (BID) | ORAL | Status: DC

## 2015-02-07 MED ORDER — LEVOFLOXACIN 750 MG PO TABS: 750.0000 mg | ORAL_TABLET | Freq: Every day | ORAL | Status: DC

## 2015-02-07 MED ORDER — FUROSEMIDE 10 MG/ML IJ SOLN
20.0000 mg | Freq: Once | INTRAMUSCULAR | Status: AC
  Administered 2015-02-07: 20 mg via INTRAVENOUS
  Filled 2015-02-07: qty 2

## 2015-02-07 MED ORDER — ACETAMINOPHEN 325 MG PO TABS: 650.0000 mg | ORAL_TABLET | Freq: Four times a day (QID) | ORAL | Status: DC | PRN

## 2015-02-07 NOTE — Clinical Social Work Psychosocial (Signed)
Clinical Social Work Department BRIEF PSYCHOSOCIAL ASSESSMENT 02/07/2015  Patient:  Todd [name], Todd [name]     Account Number:  [id number]     Admit date:  02/04/2015  Clinical Social Worker:  Lovey Newcomer  Date/Time:  02/07/2015 11:40 AM  Referred by:  Physician  Date Referred:  02/07/2015 Referred for  SNF Placement   Other Referral:   NA   Interview type:  Family Other interview type:   Patient's daughter [name] contacted to complete assessment.    PSYCHOSOCIAL DATA Living Status:  FAMILY Admitted from facility:   Level of care:   Primary support name:  Debra Primary support relationship to patient:  CHILD, ADULT Degree of support available:   Support is strong.    CURRENT CONCERNS Current Concerns  Post-Acute Placement   Other Concerns:   NA    SOCIAL WORK ASSESSMENT / PLAN CSW spoke with patient's daughter [name] by phone to complete assessment. [name] states that the family does agree that the patient needs to DC to a SNF when ready, but she shows great concern for patient as she states, "I really don't think my daddy is ready to leave the hospital today, he just looks really bad." CSW provided emotional support to [name] and explained that her concerns will be addressed with MD. [name] does states that her mom was at [facility] in the past and she was impressed by the care her mother received at the facility. Debra requests placement for her father at this facility. CSW explained SNF search/placement process and answered Debra's questions.   Assessment/plan status:  Psychosocial Support/Ongoing Assessment of Needs Other assessment/ plan:   Complete Fl2, Fax, PASRR   Information/referral to community resources:   CSW contact information given to [place].    PATIENT'S/FAMILY'S RESPONSE TO PLAN OF CARE: [name] is concerned about her father being discharged today and would like her concerns relayed to MD. CSW has notified MD who will speak with [name]. CSW has  contact [facility], and the facility states they can admit the patient on Saturday.       Liz Beach MSW, [place], [place], [phone]

## 2015-02-07 NOTE — Progress Notes (Signed)
*  PRELIMINARY RESULTS* Echocardiogram 2D Echocardiogram has been performed.  Leavy Cella 02/07/2015, 4:07 PM

## 2015-02-07 NOTE — Care Management Note (Signed)
    Page 1 of 1   02/07/2015     10:51:29 AM CARE MANAGEMENT NOTE 02/07/2015  Patient:  QUINDELL, SHERE   Account Number:  [id number]  Date Initiated:  02/07/2015  Documentation initiated by:  Tomi Bamberger  Subjective/Objective Assessment:   dx ams, weakness  admit- from home alone.     Action/Plan:   pt eval- snf   Anticipated DC Date:  02/07/2015   Anticipated DC Plan:  SKILLED NURSING FACILITY  In-house referral  Clinical Social Worker      DC Planning Services  CM consult      Choice offered to / List presented to:             Status of service:  Completed, signed off Medicare Important Message given?  YES (If response is "NO", the following Medicare IM given date fields will be blank) Date Medicare IM given:  02/07/2015 Medicare IM given by:  Tomi Bamberger Date Additional Medicare IM given:   Additional Medicare IM given by:    Discharge Disposition:  [place]  Per UR Regulation:  Reviewed for med. necessity/level of care/duration of stay  If discussed at [place] of Stay Meetings, dates discussed:    Comments:  02/07/15 [place], BSN [phone] patient is for dc to snf today.

## 2015-02-07 NOTE — Progress Notes (Signed)
Plan to check patient potassium and magnesium levels .Will report any abnormal lab values and meanwhile will continue to monitor patient.

## 2015-02-07 NOTE — Clinical Social Work Placement (Signed)
Clinical Social Work Department CLINICAL SOCIAL WORK PLACEMENT NOTE 02/07/2015  Patient:  Todd [name], Todd [name]  Account Number:  [id number] Admit date:  02/04/2015  Clinical Social Worker:  Lovey Newcomer  Date/time:  02/07/2015 05:01 PM  Clinical Social Work is seeking post-discharge placement for this patient at the following level of care:   SKILLED NURSING   (*CSW will update this form in Epic as items are completed)   02/07/2015  Patient/family provided with [place] Department of Clinical Social Work's list of facilities offering this level of care within the geographic area requested by the patient (or if unable, by the patient's family).  02/07/2015  Patient/family informed of their freedom to choose among providers that offer the needed level of care, that participate in Medicare, Medicaid or managed care program needed by the patient, have an available bed and are willing to accept the patient.  02/07/2015  Patient/family informed of [facility]' ownership interest in [facility], as well as of the fact that they are under no obligation to receive care at this facility.  PASARR submitted to EDS on 02/07/2015 PASARR number received on 02/07/2015  FL2 transmitted to all facilities in geographic area requested by pt/family on  02/07/2015 FL2 transmitted to all facilities within larger geographic area on 02/07/2015  Patient informed that his/her managed care company has contracts with or will negotiate with  certain facilities, including the following:     Patient/family informed of bed offers received:  02/07/2015 Patient chooses bed at [facility] Physician recommends and patient chooses bed at    Patient to be transferred to [facility] on  02/08/2015 Patient to be transferred to facility by Ambulance Patient and family notified of transfer on  Name of family member notified:    The following physician  request were entered in Epic:   Additional Comments:    Patient has a bed at [facility]. During assessment, wife requested this facility for placement. Facility states they are prepared to admit patient on Saturday.    Liz Beach MSW, [place], [place], [phone]

## 2015-02-07 NOTE — Progress Notes (Signed)
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 Pt was not able to provide any history at the time of the admission due to dementia and family reported fall at home as a result of poor oral intake and dehydration. He is alert and awake and conversant. He denies any new complaints.  Assessment/Plan: 1. Acute respiratory failure secondary to lobar pneumonia. Resume IV antibiotics for another 24 hours.  Blood cultures are negative, urine strep and urinary legionella are negative. Sputum cultures pending. Repeat CXR showed some superimposed CHF on the RUL pneumonitis. Getting an echocardiogram. Ordered, please follow up the echocardiogram. Ordered small dose of lasix.    2. Hypertension: Better controlled.  . Resume home meds.    3. Failure to thrive: Secondary to severe malnutrition.  PT /ot EVAL.    4. Fall: Acute on chronic t12 compression fracture.  5. Atrial fibrillation: rate controlled.  Resume xarelto.    Hypothyroidism: Resume synthroid.   Leukocytosis: Resolved. Probably from in the infection    Code Status: DNR Family Communication: none at bedside Disposition Plan: to SNF IN AM   Consultants: none  Procedures:  CT abdomen and pelvis.   Antibiotics:  Rocephin and Zithromax.   HPI/Subjective:= Denies any new complaints.   Objective: Filed Vitals:   02/07/15 0622  BP: 157/71  Pulse:   Temp: 98.1 F (36.7 C)  Resp: 20    Intake/Output Summary (Last 24 hours) at 02/07/15 1333 Last data filed at 02/07/15 1038  Gross per 24 hour  Intake   2157 ml  Output    350 ml  Net   1807 ml   Filed Weights   02/06/15 0542 02/07/15 0625  Weight: 69.99 kg (154 lb 4.8 oz) 71.6 kg (157 lb 13.6 oz)    Exam:   General:  Alert afebrile  comfortable  Cardiovascular:s1s2  Respiratory:ctab  Abdomen:soft non tender non distended bowel sounds heard  Musculoskeletal: no pedal edema.   Data Reviewed: Basic Metabolic Panel:  Recent Labs Lab 02/04/15 2120 02/04/15 2129 02/05/15 0259 02/05/15 0800 02/06/15 0551 02/07/15 1040  NA 138 139  --  136 138 138  K 4.1 4.1  --  3.6 4.0 3.8  CL 105 102  --  102 107 107  CO2 23  --   --  25 17* 26  GLUCOSE 113* 119*  --  102* 132* 151*  BUN 28* 30*  --  22 16 15   CREATININE 0.87 0.80 0.84 0.82 0.87 0.90  CALCIUM 9.1  --   --  9.0 9.3 8.7   Liver Function Tests:  Recent Labs Lab 02/05/15 0800  AST 20  ALT 13  ALKPHOS 82  BILITOT 0.7  PROT 5.8*  ALBUMIN 3.1*   No results for input(s): LIPASE, AMYLASE in the last 168 hours.  Recent Labs Lab 02/05/15 0120  AMMONIA 10*   CBC:  Recent Labs Lab 02/04/15 2120 02/04/15 2129 02/05/15 0259 02/05/15 0800 02/06/15 0551 02/07/15 1040  WBC 6.8  --  6.4 6.9 13.4* 10.1  NEUTROABS 3.1  --   --  3.5  --   --   HGB 11.0* 11.6* 10.7* 9.8* 10.4* 10.1*  HCT 33.0* 34.0* 32.8* 29.9* 32.0* 30.7*  MCV 91.7  --  92.7 91.7 92.8 92.7  PLT 296  --  271 275 267  264   Cardiac Enzymes: No results for input(s): CKTOTAL, CKMB, CKMBINDEX, TROPONINI in the last 168 hours. BNP (last 3 results)  Recent Labs  02/05/15 0105  BNP 144.1*    ProBNP (last 3 results)  Recent Labs  09/06/14 0033  PROBNP 1947.0*    CBG: No results for input(s): GLUCAP in the last 168 hours.  Recent Results (from the past 240 hour(s))  Blood culture (routine x 2)     Status: None (Preliminary result)   Collection Time: 02/04/15 11:50 PM  Result Value Ref Range Status   Specimen Description BLOOD LEFT ARM  Final   Special Requests BOTTLES DRAWN AEROBIC AND ANAEROBIC 5CC  Final   Culture   Final           BLOOD CULTURE RECEIVED NO GROWTH TO DATE CULTURE WILL BE HELD FOR 5 DAYS BEFORE ISSUING A FINAL NEGATIVE REPORT Performed at [facility]    Report Status PENDING  Incomplete  Blood culture (routine x 2)     Status: None (Preliminary result)   Collection Time: 02/05/15 12:58 AM  Result Value Ref Range Status   Specimen Description BLOOD RIGHT HAND  Final   Special Requests BOTTLES DRAWN AEROBIC ONLY 5CC  Final   Culture   Final           BLOOD CULTURE RECEIVED NO GROWTH TO DATE CULTURE WILL BE HELD FOR 5 DAYS BEFORE ISSUING A FINAL NEGATIVE REPORT Performed at [facility]    Report Status PENDING  Incomplete     Studies: Dg Chest Port 1 View  02/07/2015   CLINICAL DATA:  Weakness, history of asthma, COPD, and CHF, history of community acquired pneumonia  EXAM: PORTABLE CHEST - 1 VIEW  COMPARISON:  PA and lateral chest of February 04, 2015  FINDINGS: The interstitial prominence in the right upper lobe is more conspicuous today. Elsewhere the interstitial markings of both lungs are more prominent as well. There is no significant pleural effusion. The cardiac silhouette remains enlarged. The pulmonary vascularity is not engorged.  IMPRESSION: There is low-grade CHF superimposed upon right upper lobe pneumonia/ pneumonitis. Follow-up radiographs following anticipated antibiotic therapy are recommended to assure clearing.   Electronically Signed   By: David  Martinique   On: 02/07/2015 12:37    Scheduled Meds: . azithromycin  500 mg Intravenous Q24H  . cefTRIAXone (ROCEPHIN)  IV  1 g Intravenous Q24H  . feeding supplement (ENSURE ENLIVE)  237 mL Oral BID BM  . furosemide  20 mg Intravenous Once  . levothyroxine  25 mcg Oral QAC breakfast  . mometasone-formoterol  2 puff Inhalation BID  . pantoprazole  40 mg Oral Daily  . rivaroxaban  20 mg Oral QAC supper  . sennosides  5 mL Oral BID  . venlafaxine XR  75 mg Oral Q breakfast   Continuous Infusions:    Active Problems:   Weakness   CAP (community acquired pneumonia)   Failure to thrive in adult   Fall   Protein-calorie malnutrition, severe    Time spent:  25 minutes    [place] [facility] Pager [phone] If 7PM-7AM, please contact night-coverage at [email or web address], password [facility] 02/07/2015, 1:33 PM  LOS: 2 days

## 2015-02-07 NOTE — Discharge Summary (Addendum)
Physician Discharge Summary  Todd [name] YME:[id number] DOB: 07/04/1924 DOA: 02/04/2015  PCP: Odette Fraction, MD  Admit date: 02/04/2015 Discharge date: 02/08/2015  Time spent: 30 minutes  Recommendations for Outpatient Follow-up:  1. Follow u pwith PCP in one week.  2. Repeat CBC/BMET in 1 week 3. Consider Speech therapy eval while at SNF  Discharge Diagnoses:  Active Problems:   Weakness   CAP (community acquired pneumonia)   Failure to thrive in adult   Fall   Protein-calorie malnutrition, severe   Discharge Condition: improved  Diet recommendation: low sodium diet.   Filed Weights   02/06/15 0542 02/07/15 0625 02/08/15 0552  Weight: 69.99 kg (154 lb 4.8 oz) 71.6 kg (157 lb 13.6 oz) 71.3 kg (157 lb 3 oz)    History of present illness:  Pt is 81 yo male with a-fb on xarelto, dementia, COPD, CHF, presented with failure to thrive, cough productive of clear sputum. Pt was not able to provide any history at the time of the admission due to dementia and family reported fall at home as a result of poor oral intake and dehydration.   Hospital Course:  Acute respiratory failure : probably from lobar pneumonia. Admitted and started Rocephin and zithromax. Blood cultures negative.Urine strepto and urinary legionella are negative.  Remains afebrile, clinically improved, stable to transfer to SNF.   Failure to thrive and multiple falls: Dr Karleen Hampshire discussed with the daughter that he is on xarelto and he is at risk for ICH when he falls. Daughter understands the risk and will follow up with PCP and make determination whether or not to discontinue anticoagulation.  Fall: found to have acute on chronic t 12 compression fracture, pain control.   WKG:SUPJSRPRXY, continue with Losatan  Hypothyroidism:continue Levothyroxine  Hx of Afib:rate controlled. Continue Xarelto. See above regarding falls  Chronic Diastolic VOP:[id number]. Consider prn lasix.  Severe protein calorie  malnutrition: continue supplements  Procedures:  Echocardiogram.  Consultations:  none  Discharge Exam: Filed Vitals:   02/08/15 0552  BP: 159/66  Pulse: 47  Temp: 97.4 F (36.3 C)  Resp: 16    General: alert afebrile, comfortable. Cardiovascular: s1s2 Respiratory: good air entry b/l. No rales or rhonchi CVS:S1S2 irregular Abd: soft Non tender, not distended Ext:no edema Neuro: Non focal  Discharge Instructions   Discharge Instructions    Diet - low sodium heart healthy    Complete by:  As directed      Discharge instructions    Complete by:  As directed   Follow up with PCP in 1 to 2 weeks.          Current Discharge Medication List    START taking these medications   Details  feeding supplement, ENSURE ENLIVE, (ENSURE ENLIVE) LIQD Take 237 mLs by mouth 2 (two) times daily between meals. Qty: 237 mL, Refills: 12    levofloxacin (LEVAQUIN) 750 MG tablet Take 1 tablet (750 mg total) by mouth daily. Qty: 4 tablet, Refills: 0      CONTINUE these medications which have NOT CHANGED   Details  ADVAIR DISKUS 500-50 MCG/DOSE AEPB INHALE 1 PUFF TWICE A DAY Qty: 180 each, Refills: 1    ferrous sulfate 325 (65 FE) MG tablet TAKE 1 TABLET BY MOUTH EVERY DAY Qty: 30 tablet, Refills: 11    fluticasone (FLONASE) 50 MCG/ACT nasal spray Place 2 sprays into both nostrils daily.    levothyroxine (SYNTHROID, LEVOTHROID) 25 MCG tablet Take 25 mcg by mouth daily.    losartan (COZAAR)  50 MG tablet Take 50 mg by mouth daily. Refills: 11    omeprazole (PRILOSEC) 40 MG capsule Take 40 mg by mouth daily.    pravastatin (PRAVACHOL) 20 MG tablet Take 20 mg by mouth daily.    rivaroxaban (XARELTO) 20 MG TABS tablet Take 20 mg by mouth every morning.     venlafaxine XR (EFFEXOR XR) 37.5 MG 24 hr capsule 2 pills in AM Qty: 60 capsule, Refills: 3   Associated Diagnoses: Depression      STOP taking these medications     LORazepam (ATIVAN) 0.5 MG tablet        No Known  Allergies Follow-up Information    Follow up with [facility] TOM, MD. Schedule an appointment as soon as possible for a visit in 1 week.   Specialty:  Family Medicine   Contact information:   [address] [place] [address] [phone]        The results of significant diagnostics from this hospitalization (including imaging, microbiology, ancillary and laboratory) are listed below for reference.    Significant Diagnostic Studies: Dg Chest 2 View  02/04/2015   CLINICAL DATA:  Pain following fall  EXAM: CHEST  2 VIEW  COMPARISON:  September 05, 2014  FINDINGS: There is subtle interstitial prominence throughout the right upper lobe. Lungs elsewhere are clear. Heart is slightly enlarged with pulmonary vascularity within normal limits. No adenopathy. There is degenerative change in the thoracic spine. There is anterior wedging of the T12 vertebral body.  IMPRESSION: Diffuse interstitial prominence right upper lobe. Suspect interstitial pneumonitis in this area. Lungs elsewhere clear. Heart mildly enlarged but stable.   Electronically Signed   By: Lowella Grip III M.D.   On: 02/04/2015 20:13   Ct Head Wo Contrast  02/04/2015   CLINICAL DATA:  Fall, headache, altered mental status, left forehead bruising  EXAM: CT HEAD WITHOUT CONTRAST  CT MAXILLOFACIAL WITHOUT CONTRAST  CT CERVICAL SPINE WITHOUT CONTRAST  TECHNIQUE: Multidetector CT imaging of the head, cervical spine, and maxillofacial structures were performed using the standard protocol without intravenous contrast. Multiplanar CT image reconstructions of the cervical spine and maxillofacial structures were also generated.  COMPARISON:  MRI brain dated 06/19/2011.  CT head dated 06/18/2011.  FINDINGS: CT HEAD FINDINGS  Motion degraded images.  No evidence of parenchymal hemorrhage or extra-axial fluid collection. No mass lesion, mass effect, or midline shift.  No CT evidence of acute infarction.  Encephalomalacic changes related  to prior left parietal and occipital infarcts.  Subcortical white matter and periventricular small vessel ischemic changes. Intracranial atherosclerosis.  Global cortical atrophy.  Secondary ventriculomegaly.  The visualized paranasal sinuses are essentially clear. The mastoid air cells are unopacified.  No evidence of calvarial fracture.  CT MAXILLOFACIAL FINDINGS  Soft tissue swelling/ hematoma overlying the left frontal bone (series 6/ image 85).  No evidence of maxillofacial fracture.  The visualized paranasal sinuses are essentially clear. The mastoid air cells are unopacified.  The bilateral orbits, including the globes and retroconal soft tissues, are within normal limits.  The mandible is intact. Mild anterior subluxation of the left mandibular condyle relative to the TMJ with associated degenerative changes, chronic.  CT CERVICAL SPINE FINDINGS  Normal cervical lordosis.  No evidence of fracture or dislocation. Vertebral body heights are maintained. Dens appears intact.  No prevertebral soft tissue swelling.  Mild to moderate degenerative changes.  Visualized right thyroid is mildly heterogeneous. Left thyroid is not well visualized.  Visualized lungs are notable for  biapical pleural parenchymal scarring.  IMPRESSION: No evidence of acute intracranial abnormality. 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 Atrophy with small vessel ischemic changes.  Soft tissue swelling/hematoma overlying the left frontal bone. No evidence of maxillofacial fracture.  No evidence of traumatic injury to the cervical spine. Mild to moderate degenerative changes.   Electronically Signed   By: Julian Hy M.D.   On: 02/04/2015 22:37   Ct Cervical Spine Wo Contrast  02/04/2015   CLINICAL DATA:  Fall, headache, altered mental status, left forehead bruising  EXAM: CT HEAD WITHOUT CONTRAST  CT MAXILLOFACIAL WITHOUT CONTRAST  CT CERVICAL SPINE WITHOUT CONTRAST  TECHNIQUE: Multidetector CT  imaging of the head, cervical spine, and maxillofacial structures were performed using the standard protocol without intravenous contrast. Multiplanar CT image reconstructions of the cervical spine and maxillofacial structures were also generated.  COMPARISON:  MRI brain dated 06/19/2011.  CT head dated 06/18/2011.  FINDINGS: CT HEAD FINDINGS  Motion degraded images.  No evidence of parenchymal hemorrhage or extra-axial fluid collection. No mass lesion, mass effect, or midline shift.  No CT evidence of acute infarction.  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  Subcortical white matter and periventricular small vessel ischemic changes. Intracranial atherosclerosis.  Global cortical atrophy.  Secondary ventriculomegaly.  The visualized paranasal sinuses are essentially clear. The mastoid air cells are unopacified.  No evidence of calvarial fracture.  CT MAXILLOFACIAL FINDINGS  Soft tissue swelling/ hematoma overlying the left frontal bone (series 6/ image 85).  No evidence of maxillofacial fracture.  The visualized paranasal sinuses are essentially clear. The mastoid air cells are unopacified.  The bilateral orbits, including the globes and retroconal soft tissues, are within normal limits.  The mandible is intact. Mild anterior subluxation of the left mandibular condyle relative to the TMJ with associated degenerative changes, chronic.  CT CERVICAL SPINE FINDINGS  Normal cervical lordosis.  No evidence of fracture or dislocation. Vertebral body heights are maintained. Dens appears intact.  No prevertebral soft tissue swelling.  Mild to moderate degenerative changes.  Visualized right thyroid is mildly heterogeneous. Left thyroid is not well visualized.  Visualized lungs are notable for biapical pleural parenchymal scarring.  IMPRESSION: No evidence of acute intracranial abnormality. 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 Atrophy with  small vessel ischemic changes.  Soft tissue swelling/hematoma overlying the left frontal bone. No evidence of maxillofacial fracture.  No evidence of traumatic injury to the cervical spine. Mild to moderate degenerative changes.   Electronically Signed   By: Julian Hy M.D.   On: 02/04/2015 22:37   Ct Abdomen Pelvis W Contrast  02/04/2015   CLINICAL DATA:  Fall with headache and altered mental status.  EXAM: CT ABDOMEN AND PELVIS WITH CONTRAST  TECHNIQUE: Multidetector CT imaging of the abdomen and pelvis was performed using the standard protocol following bolus administration of intravenous contrast.  CONTRAST:  12mL OMNIPAQUE IOHEXOL 300 MG/ML  SOLN  COMPARISON:  09/05/2014  FINDINGS: BODY WALL: No contributory findings.  LOWER CHEST: Bronchial wall thickening in the lower lungs with intermittent bronchial opacification and diffuse reticulation. No superimposed traumatic findings.  ABDOMEN/PELVIS:  Liver: No evidence of laceration.  Biliary: Cholelithiasis. The gallbladder is is distended and mildly thick walled but it is reassuring that the wall thickening is stable and right upper quadrant edema has improved. On image 30 and there is an apparent high density filling defect within the common bile duct, but this is a  neighboring vessel on reformats.  Pancreas: Unremarkable.  Spleen: Unremarkable.  Adrenals: Unremarkable.  Kidneys and ureters: Bilateral renal cortical thinning and cysts. No hydronephrosis or stone.  Bladder: Unremarkable.  Reproductive: Negative for age.  Bowel: Proximal colon resection with enteroenteric anastomosis. There is no bowel obstruction. Small ileocolic region lymph nodes are stable from previous. No inflammatory change.  Retroperitoneum: No mass or adenopathy.  Vascular: No acute abnormality.  OSSEOUS: Remote compression or endplate fractures of T9, T12, L2, L3, L4, and L5. There is an acute superimposed horizontal fracture through the anterior T12 body without further of  compression (pre-existing approximately 50% compression) or retropulsion. No acute fracture identified. Osteopenia.  IMPRESSION: 1. Acute on chronic T12 body fracture. No retropulsion or increased compression. 2. No evidence of intra-abdominal injury. 3. Numerous chronic findings are stable from 2015 and noted above.   Electronically Signed   By: Monte Fantasia M.D.   On: 02/04/2015 22:43   Dg Chest Port 1 View  02/07/2015   CLINICAL DATA:  Weakness, history of asthma, COPD, and CHF, history of community acquired pneumonia  EXAM: PORTABLE CHEST - 1 VIEW  COMPARISON:  PA and lateral chest of February 04, 2015  FINDINGS: The interstitial prominence in the right upper lobe is more conspicuous today. Elsewhere the interstitial markings of both lungs are more prominent as well. There is no significant pleural effusion. The cardiac silhouette remains enlarged. The pulmonary vascularity is not engorged.  IMPRESSION: There is low-grade CHF superimposed upon right upper lobe pneumonia/ pneumonitis. Follow-up radiographs following anticipated antibiotic therapy are recommended to assure clearing.   Electronically Signed   By: David  Martinique   On: 02/07/2015 12:37   Ct Maxillofacial Wo Cm  02/04/2015   CLINICAL DATA:  Fall, headache, altered mental status, left forehead bruising  EXAM: CT HEAD WITHOUT CONTRAST  CT MAXILLOFACIAL WITHOUT CONTRAST  CT CERVICAL SPINE WITHOUT CONTRAST  TECHNIQUE: Multidetector CT imaging of the head, cervical spine, and maxillofacial structures were performed using the standard protocol without intravenous contrast. Multiplanar CT image reconstructions of the cervical spine and maxillofacial structures were also generated.  COMPARISON:  MRI brain dated 06/19/2011.  CT head dated 06/18/2011.  FINDINGS: CT HEAD FINDINGS  Motion degraded images.  No evidence of parenchymal hemorrhage or extra-axial fluid collection. No mass lesion, mass effect, or midline shift.  No CT evidence of acute infarction.   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  Subcortical white matter and periventricular small vessel ischemic changes. Intracranial atherosclerosis.  Global cortical atrophy.  Secondary ventriculomegaly.  The visualized paranasal sinuses are essentially clear. The mastoid air cells are unopacified.  No evidence of calvarial fracture.  CT MAXILLOFACIAL FINDINGS  Soft tissue swelling/ hematoma overlying the left frontal bone (series 6/ image 85).  No evidence of maxillofacial fracture.  The visualized paranasal sinuses are essentially clear. The mastoid air cells are unopacified.  The bilateral orbits, including the globes and retroconal soft tissues, are within normal limits.  The mandible is intact. Mild anterior subluxation of the left mandibular condyle relative to the TMJ with associated degenerative changes, chronic.  CT CERVICAL SPINE FINDINGS  Normal cervical lordosis.  No evidence of fracture or dislocation. Vertebral body heights are maintained. Dens appears intact.  No prevertebral soft tissue swelling.  Mild to moderate degenerative changes.  Visualized right thyroid is mildly heterogeneous. Left thyroid is not well visualized.  Visualized lungs are notable for biapical pleural parenchymal scarring.  IMPRESSION: No evidence of acute intracranial abnormality.  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 Atrophy with small vessel ischemic changes.  Soft tissue swelling/hematoma overlying the left frontal bone. No evidence of maxillofacial fracture.  No evidence of traumatic injury to the cervical spine. Mild to moderate degenerative changes.   Electronically Signed   By: Julian Hy M.D.   On: 02/04/2015 22:37    Microbiology: Recent Results (from the past 240 hour(s))  Blood culture (routine x 2)     Status: None (Preliminary result)   Collection Time: 02/04/15 11:50 PM  Result Value Ref Range Status   Specimen  Description BLOOD LEFT ARM  Final   Special Requests BOTTLES DRAWN AEROBIC AND ANAEROBIC 5CC  Final   Culture   Final           BLOOD CULTURE RECEIVED NO GROWTH TO DATE CULTURE WILL BE HELD FOR 5 DAYS BEFORE ISSUING A FINAL NEGATIVE REPORT Performed at [facility]    Report Status PENDING  Incomplete  Blood culture (routine x 2)     Status: None (Preliminary result)   Collection Time: 02/05/15 12:58 AM  Result Value Ref Range Status   Specimen Description BLOOD RIGHT HAND  Final   Special Requests BOTTLES DRAWN AEROBIC ONLY 5CC  Final   Culture   Final           BLOOD CULTURE RECEIVED NO GROWTH TO DATE CULTURE WILL BE HELD FOR 5 DAYS BEFORE ISSUING A FINAL NEGATIVE REPORT Performed at [facility]    Report Status PENDING  Incomplete     Labs: Basic Metabolic Panel:  Recent Labs Lab 02/04/15 2120 02/04/15 2129 02/05/15 0259 02/05/15 0800 02/06/15 0551 02/07/15 1040 02/07/15 2329  NA 138 139  --  136 138 138  --   K 4.1 4.1  --  3.6 4.0 3.8 3.5  CL 105 102  --  102 107 107  --   CO2 23  --   --  25 17* 26  --   GLUCOSE 113* 119*  --  102* 132* 151*  --   BUN 28* 30*  --  22 16 15   --   CREATININE 0.87 0.80 0.84 0.82 0.87 0.90  --   CALCIUM 9.1  --   --  9.0 9.3 8.7  --   MG  --   --   --   --   --   --  1.9   Liver Function Tests:  Recent Labs Lab 02/05/15 0800  AST 20  ALT 13  ALKPHOS 82  BILITOT 0.7  PROT 5.8*  ALBUMIN 3.1*   No results for input(s): LIPASE, AMYLASE in the last 168 hours.  Recent Labs Lab 02/05/15 0120  AMMONIA 10*   CBC:  Recent Labs Lab 02/04/15 2120 02/04/15 2129 02/05/15 0259 02/05/15 0800 02/06/15 0551 02/07/15 1040  WBC 6.8  --  6.4 6.9 13.4* 10.1  NEUTROABS 3.1  --   --  3.5  --   --   HGB 11.0* 11.6* 10.7* 9.8* 10.4* 10.1*  HCT 33.0* 34.0* 32.8* 29.9* 32.0* 30.7*  MCV 91.7  --  92.7 91.7 92.8 92.7  PLT 296  --  271 275 267 264   Cardiac Enzymes: No results for input(s): CKTOTAL, CKMB, CKMBINDEX,  TROPONINI in the last 168 hours. BNP: BNP (last 3 results)  Recent Labs  02/05/15 0105  BNP 144.1*    ProBNP (last 3 results)  Recent Labs  09/06/14 0033  PROBNP 1947.0*  CBG: No results for input(s): GLUCAP in the last 168 hours.   SignedOren Binet  [facility] 02/08/2015, 10:36 AM

## 2015-02-07 NOTE — Progress Notes (Signed)
2228  Patient had 5 beat run ventricular tachycardia returning to atrial fibrillation while asleep.Patient has been having frequent pvc's on monitor.Patient awaken asymptomatic B/P 150/70 denies chest pain,palpitations ,or shortness of breath.Text paged Walden Field NP this information.No new orders at present time will continue to monitor patient .

## 2015-02-08 DIAGNOSIS — W19XXXD Unspecified fall, subsequent encounter: Secondary | ICD-10-CM

## 2015-02-08 DIAGNOSIS — I5032 Chronic diastolic (congestive) heart failure: Secondary | ICD-10-CM

## 2015-02-08 LAB — POTASSIUM: POTASSIUM: 3.5 mmol/L (ref 3.5–5.1)

## 2015-02-08 LAB — MAGNESIUM: MAGNESIUM: 1.9 mg/dL (ref 1.5–2.5)

## 2015-02-08 NOTE — Progress Notes (Signed)
02/08/15 Patient going to skilled nursing facility this afternoon, IV site removed.

## 2015-02-08 NOTE — Progress Notes (Signed)
[facility] at bedside to transport patient to [place].

## 2015-02-08 NOTE — Social Work (Signed)
CSW coordinated patient discharge to [facility]. RN, daughter and facility aware of patient transition to SNF. Transportation has been contacted and will arrive when available.  No further CSW needs. Christene Lye MSW, [place]

## 2015-02-10 ENCOUNTER — Encounter: Payer: Self-pay | Admitting: Internal Medicine

## 2015-02-10 ENCOUNTER — Non-Acute Institutional Stay (SKILLED_NURSING_FACILITY): Payer: Medicare Other | Admitting: Internal Medicine

## 2015-02-10 DIAGNOSIS — E43 Unspecified severe protein-calorie malnutrition: Secondary | ICD-10-CM

## 2015-02-10 DIAGNOSIS — I482 Chronic atrial fibrillation, unspecified: Secondary | ICD-10-CM

## 2015-02-10 DIAGNOSIS — I110 Hypertensive heart disease with heart failure: Secondary | ICD-10-CM | POA: Diagnosis not present

## 2015-02-10 DIAGNOSIS — I5032 Chronic diastolic (congestive) heart failure: Secondary | ICD-10-CM

## 2015-02-10 DIAGNOSIS — J9601 Acute respiratory failure with hypoxia: Secondary | ICD-10-CM | POA: Diagnosis not present

## 2015-02-10 DIAGNOSIS — I509 Heart failure, unspecified: Secondary | ICD-10-CM

## 2015-02-10 DIAGNOSIS — W19XXXD Unspecified fall, subsequent encounter: Secondary | ICD-10-CM | POA: Diagnosis not present

## 2015-02-10 DIAGNOSIS — E034 Atrophy of thyroid (acquired): Secondary | ICD-10-CM

## 2015-02-10 DIAGNOSIS — E038 Other specified hypothyroidism: Secondary | ICD-10-CM | POA: Diagnosis not present

## 2015-02-10 DIAGNOSIS — R627 Adult failure to thrive: Secondary | ICD-10-CM | POA: Diagnosis not present

## 2015-02-10 NOTE — Assessment & Plan Note (Signed)
2/2 dementia with falls and protein calorie malnutrition

## 2015-02-10 NOTE — Assessment & Plan Note (Signed)
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 Daughter understands the risk and will follow up with PCP and make determination whether or not to discontinue anticoagulation found to have acute on chronic t 12 compression fracture, pain control

## 2015-02-10 NOTE — Assessment & Plan Note (Signed)
probably from lobar pneumonia. Admitted and started Rocephin and zithromax. Blood cultures negative.Urine strepto and urinary legionella are negative. Remains afebrile, clinically improved, stable to transfer to SNF.

## 2015-02-10 NOTE — Assessment & Plan Note (Signed)
On ARB and xarelto for a fib;lasix prn weight gain

## 2015-02-10 NOTE — Assessment & Plan Note (Signed)
Controlled on losartan

## 2015-02-10 NOTE — Assessment & Plan Note (Signed)
Dietary supplements

## 2015-02-10 NOTE — Assessment & Plan Note (Signed)
:  rate controlled. Continue Xarelto. See above regarding falls

## 2015-02-10 NOTE — Assessment & Plan Note (Signed)
TSH 3.8;Continue synthroid 25 mcg

## 2015-02-11 LAB — CULTURE, BLOOD (ROUTINE X 2)
CULTURE: NO GROWTH
Culture: NO GROWTH

## 2015-02-11 LAB — VITAMIN D 1,25 DIHYDROXY
Vitamin D 1, 25 (OH)2 Total: 45 pg/mL
Vitamin D2 1, 25 (OH)2: <10 pg/mL
Vitamin D3 1, 25 (OH)2: 45 pg/mL

## 2015-02-16 ENCOUNTER — Encounter: Payer: Self-pay | Admitting: Internal Medicine

## 2015-02-16 NOTE — Progress Notes (Signed)
MRN: [id number] Name: Todd [name]  Sex: male Age: 81 y.o. DOB: Mar 17, 1924  [place] [facility] Facility/Room:307 Level Of Care: SNF Provider: Inocencio Homes D Emergency Contacts: Extended Emergency Contact Information Primary Emergency Contact: Agapito Games [place] of [place] Mobile Phone: [phone] Relation: Son Secondary Emergency Contact: Seymour Bars [place] of [place] Phone: [phone] Relation: Daughter  Code Status: DNR  Allergies: Review of patient's allergies indicates no known allergies.  Chief Complaint  Patient presents with  . New Admit To SNF    HPI: Patient is 81 y.o. male who was admitted to hospital with PNA, weakness and falls who is now being admitted to SNF for OT/PT.  Past Medical History  Diagnosis Date  . Asthma   . Hypertension   . A-fib   . Hyperlipidemia   . Shortness of breath   . CHF (congestive heart failure) 10/28/2011  . Forgetfulness   . PVC's (premature ventricular contractions)   . Mild aortic insufficiency   . Dementia     [name] 02/05/2015  . Failure to thrive in adult     /notes 02/05/2015  . COPD (chronic obstructive pulmonary disease)   . Hypothyroidism     [name] 02/05/2015  . Fall 02/04/2015    with altered mental status off of his baseline after he suffered a mechanical fall /notes 02/04/2015  . CAP (community acquired pneumonia)     [name] 02/05/2015  . Colon cancer     Past Surgical History  Procedure Laterality Date  . Colon surgery      Partial hemecolectomy   . Central line insertion  09/08/2011           Medication List       This list is accurate as of: 02/10/15 11:59 PM.  Always use your most recent med list.               ADVAIR DISKUS 500-50 MCG/DOSE Aepb  Generic drug:  Fluticasone-Salmeterol  INHALE 1 PUFF TWICE A DAY     feeding supplement (ENSURE ENLIVE) Liqd  Take 237 mLs by mouth 2 (two) times daily between meals.     ferrous sulfate 325 (65 FE) MG tablet  TAKE 1  TABLET BY MOUTH EVERY DAY     fluticasone 50 MCG/ACT nasal spray  Commonly known as:  FLONASE  Place 2 sprays into both nostrils daily.     levofloxacin 750 MG tablet  Commonly known as:  LEVAQUIN  Take 1 tablet (750 mg total) by mouth daily.     levothyroxine 25 MCG tablet  Commonly known as:  SYNTHROID, LEVOTHROID  Take 25 mcg by mouth daily.     losartan 50 MG tablet  Commonly known as:  COZAAR  Take 50 mg by mouth daily.     omeprazole 40 MG capsule  Commonly known as:  PRILOSEC  Take 40 mg by mouth daily.     pravastatin 20 MG tablet  Commonly known as:  PRAVACHOL  Take 20 mg by mouth daily.     rivaroxaban 20 MG Tabs tablet  Commonly known as:  XARELTO  Take 20 mg by mouth every morning.     venlafaxine XR 37.5 MG 24 hr capsule  Commonly known as:  EFFEXOR XR  2 pills in AM        No orders of the defined types were placed in this encounter.    Immunization History  Administered Date(s) Administered  . Influenza Whole 07/02/2009, 09/10/2011  . Influenza,inj,Quad PF,36+ Mos  08/28/2013, 09/05/2014  . Pneumococcal Conjugate-13 09/05/2014  . Pneumococcal Polysaccharide-23 11/01/2006  . Zoster 08/26/2006    History  Substance Use Topics  . 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 Smokeless tobacco: Not on file     Comment: quit 30+ yrs ago  . Alcohol Use: No    Family history is noncontributory    Review of Systems  DATA OBTAINED: from patient, nurse GENERAL:  no fevers, fatigue, appetite changes SKIN: No itching, rash or wounds EYES: No eye pain, redness, discharge EARS: No earache, tinnitus, change in hearing NOSE: No congestion, drainage or bleeding  MOUTH/THROAT: No mouth or tooth pain, No sore throat RESPIRATORY: No cough, wheezing, SOB CARDIAC: No chest pain, palpitations, lower extremity edema  GI: No abdominal pain, No N/V/D or constipation, No heartburn or reflux  GU: No dysuria, frequency or urgency, or incontinence  MUSCULOSKELETAL: No  unrelieved bone/joint pain NEUROLOGIC: No headache, dizziness PSYCHIATRIC: dementia,   Filed Vitals:   02/10/15 1207  BP: 130/80  Pulse: 70  Temp: 96 F (35.6 C)  Resp: 20    Physical Exam  GENERAL APPEARANCE: Alert, conversant,  WM in WC, No acute distress.  SKIN: No diaphoresis rash HEAD: Normocephalic, atraumatic  EYES: Conjunctiva/lids clear. Pupils round, reactive. EOMs intact.  EARS: External exam WNL, canals clear. Hearing grossly normal.  NOSE: No deformity or discharge.  MOUTH/THROAT: Lips w/o lesions  RESPIRATORY: Breathing is even, unlabored. Lung sounds are clear   CARDIOVASCULAR: Heart RRR no murmurs, rubs or gallops. No peripheral edema.   GASTROINTESTINAL: Abdomen is soft, non-tender, not distended w/ normal bowel sounds. GENITOURINARY: Bladder non tender, not distended  MUSCULOSKELETAL: No abnormal joints or musculature NEUROLOGIC:  Cranial nerves 2-12 grossly intact  PSYCHIATRIC: dementia  Patient Active Problem List   Diagnosis Date Noted  . Protein-calorie malnutrition, severe 02/06/2015  . Weakness 02/05/2015  . CAP (community acquired pneumonia) 02/05/2015  . Failure to thrive in adult 02/05/2015  . Fall 02/05/2015  . Pancreatitis, gallstone   . Cholecystitis, acute   . Elevated LFTs   . Preoperative cardiovascular examination   . Acute pancreatitis   . Pancreatitis 09/05/2014  . Hypothyroidism 09/05/2014  . Gallstone pancreatitis 11/23/2013  . Chronic diastolic CHF (congestive heart failure) 10/28/2011  . Pneumonia 09/06/2011  . Sepsis(995.91) 09/06/2011  . Chest pain 09/06/2011  . Atrial fibrillation 09/06/2011  . Acute respiratory failure with hypoxia 09/06/2011  . PNEUMONIA, ORGANISM UNSPECIFIED 03/16/2010  . EDEMA 06/17/2008  . HLD (hyperlipidemia) 01/24/2008  . Hypertensive heart disease with CHF 01/24/2008  . ALLERGIC RHINITIS 01/24/2008  . Asthma 01/24/2008  . BRONCHIECTASIS 01/24/2008  . Nonspecific (abnormal) findings on  radiological and other examination of body structure 01/24/2008  . COLON CANCER 01/24/2008  . CHEST XRAY, ABNORMAL 01/24/2008    CBC    Component Value Date/Time   WBC 10.1 02/07/2015 1040   RBC 3.31* 02/07/2015 1040   RBC 3.42* 09/14/2011 0955   HGB 10.1* 02/07/2015 1040   HCT 30.7* 02/07/2015 1040   PLT 264 02/07/2015 1040   MCV 92.7 02/07/2015 1040   LYMPHSABS 2.0 02/05/2015 0800   MONOABS 1.2* 02/05/2015 0800   EOSABS 0.2 02/05/2015 0800   BASOSABS 0.0 02/05/2015 0800    CMP     Component Value Date/Time   NA 138 02/07/2015 1040   K 3.5 02/07/2015 2329   CL 107 02/07/2015 1040   CO2 26 02/07/2015 1040   GLUCOSE 151* 02/07/2015 1040   BUN 15 02/07/2015 1040   CREATININE 0.90 02/07/2015  1040   CREATININE 1.12 01/13/2015 1507   CALCIUM 8.7 02/07/2015 1040   PROT 5.8* 02/05/2015 0800   ALBUMIN 3.1* 02/05/2015 0800   AST 20 02/05/2015 0800   ALT 13 02/05/2015 0800   ALKPHOS 82 02/05/2015 0800   BILITOT 0.7 02/05/2015 0800   GFRNONAA 74* 02/07/2015 1040   GFRNONAA 58* 01/13/2015 1507   GFRAA 86* 02/07/2015 1040   GFRAA 67 01/13/2015 1507    Assessment and Plan  Acute respiratory failure with hypoxia probably from lobar pneumonia. Admitted and started Rocephin and zithromax. Blood cultures negative.Urine strepto and urinary legionella are negative. Remains afebrile, clinically improved, stable to transfer to SNF.    Fall Dr Karleen Hampshire discussed with the daughter that he is on xarelto and he is at risk for ICH when he falls. Daughter understands the risk and will follow up with PCP and make determination whether or not to discontinue anticoagulation found to have acute on chronic t 12 compression fracture, pain control    Hypertensive heart disease with CHF Controlled on losartan   Atrial fibrillation :rate controlled. Continue Xarelto. See above regarding falls   Chronic diastolic CHF (congestive heart failure) On ARB and xarelto for a fib;lasix prn weight  gain   Hypothyroidism TSH 3.8;Continue synthroid 25 mcg   Failure to thrive in adult 2/2 dementia with falls and protein calorie malnutrition   Protein-calorie malnutrition, severe Dietary supplements     Hennie Duos, MD

## 2015-03-08 ENCOUNTER — Emergency Department (HOSPITAL_COMMUNITY): Payer: Medicare Other

## 2015-03-08 ENCOUNTER — Inpatient Hospital Stay (HOSPITAL_COMMUNITY)
Admission: EM | Admit: 2015-03-08 | Discharge: 2015-03-11 | DRG: 871 | Disposition: A | Discharge status: Skilled Nursing Facility | Payer: Medicare Other | Attending: Internal Medicine | Admitting: Internal Medicine

## 2015-03-08 ENCOUNTER — Encounter (HOSPITAL_COMMUNITY): Payer: Self-pay | Admitting: Emergency Medicine

## 2015-03-08 ENCOUNTER — Inpatient Hospital Stay (HOSPITAL_COMMUNITY): Payer: Medicare Other

## 2015-03-08 DIAGNOSIS — I482 Chronic atrial fibrillation, unspecified: Secondary | ICD-10-CM | POA: Diagnosis present

## 2015-03-08 DIAGNOSIS — Z7901 Long term (current) use of anticoagulants: Secondary | ICD-10-CM | POA: Diagnosis not present

## 2015-03-08 DIAGNOSIS — F329 Major depressive disorder, single episode, unspecified: Secondary | ICD-10-CM

## 2015-03-08 DIAGNOSIS — D638 Anemia in other chronic diseases classified elsewhere: Secondary | ICD-10-CM | POA: Diagnosis present

## 2015-03-08 DIAGNOSIS — Z66 Do not resuscitate: Secondary | ICD-10-CM | POA: Diagnosis present

## 2015-03-08 DIAGNOSIS — I5032 Chronic diastolic (congestive) heart failure: Secondary | ICD-10-CM | POA: Diagnosis present

## 2015-03-08 DIAGNOSIS — J45909 Unspecified asthma, uncomplicated: Secondary | ICD-10-CM | POA: Diagnosis present

## 2015-03-08 DIAGNOSIS — Z6824 Body mass index (BMI) 24.0-24.9, adult: Secondary | ICD-10-CM | POA: Diagnosis not present

## 2015-03-08 DIAGNOSIS — J441 Chronic obstructive pulmonary disease with (acute) exacerbation: Secondary | ICD-10-CM | POA: Diagnosis present

## 2015-03-08 DIAGNOSIS — D509 Iron deficiency anemia, unspecified: Secondary | ICD-10-CM | POA: Diagnosis present

## 2015-03-08 DIAGNOSIS — I493 Ventricular premature depolarization: Secondary | ICD-10-CM | POA: Diagnosis present

## 2015-03-08 DIAGNOSIS — Z9181 History of falling: Secondary | ICD-10-CM | POA: Diagnosis not present

## 2015-03-08 DIAGNOSIS — E43 Unspecified severe protein-calorie malnutrition: Secondary | ICD-10-CM | POA: Diagnosis present

## 2015-03-08 DIAGNOSIS — E785 Hyperlipidemia, unspecified: Secondary | ICD-10-CM | POA: Diagnosis present

## 2015-03-08 DIAGNOSIS — G9340 Encephalopathy, unspecified: Secondary | ICD-10-CM | POA: Diagnosis present

## 2015-03-08 DIAGNOSIS — I10 Essential (primary) hypertension: Secondary | ICD-10-CM | POA: Diagnosis present

## 2015-03-08 DIAGNOSIS — E876 Hypokalemia: Secondary | ICD-10-CM | POA: Diagnosis present

## 2015-03-08 DIAGNOSIS — R509 Fever, unspecified: Secondary | ICD-10-CM | POA: Diagnosis not present

## 2015-03-08 DIAGNOSIS — Z87891 Personal history of nicotine dependence: Secondary | ICD-10-CM | POA: Diagnosis not present

## 2015-03-08 DIAGNOSIS — Z85038 Personal history of other malignant neoplasm of large intestine: Secondary | ICD-10-CM | POA: Diagnosis not present

## 2015-03-08 DIAGNOSIS — F32A Depression, unspecified: Secondary | ICD-10-CM

## 2015-03-08 DIAGNOSIS — H9190 Unspecified hearing loss, unspecified ear: Secondary | ICD-10-CM | POA: Diagnosis present

## 2015-03-08 DIAGNOSIS — Z515 Encounter for palliative care: Secondary | ICD-10-CM | POA: Diagnosis not present

## 2015-03-08 DIAGNOSIS — J9601 Acute respiratory failure with hypoxia: Secondary | ICD-10-CM | POA: Diagnosis present

## 2015-03-08 DIAGNOSIS — F0390 Unspecified dementia without behavioral disturbance: Secondary | ICD-10-CM | POA: Diagnosis present

## 2015-03-08 DIAGNOSIS — E039 Hypothyroidism, unspecified: Secondary | ICD-10-CM | POA: Diagnosis present

## 2015-03-08 DIAGNOSIS — A419 Sepsis, unspecified organism: Secondary | ICD-10-CM | POA: Diagnosis not present

## 2015-03-08 DIAGNOSIS — R627 Adult failure to thrive: Secondary | ICD-10-CM | POA: Diagnosis present

## 2015-03-08 DIAGNOSIS — J189 Pneumonia, unspecified organism: Secondary | ICD-10-CM | POA: Diagnosis present

## 2015-03-08 DIAGNOSIS — Y95 Nosocomial condition: Secondary | ICD-10-CM | POA: Diagnosis present

## 2015-03-08 DIAGNOSIS — E860 Dehydration: Secondary | ICD-10-CM | POA: Diagnosis present

## 2015-03-08 DIAGNOSIS — J690 Pneumonitis due to inhalation of food and vomit: Secondary | ICD-10-CM | POA: Diagnosis present

## 2015-03-08 LAB — I-STAT CG4 LACTIC ACID, ED
Lactic Acid, Venous: 0.95 mmol/L (ref 0.5–2.0)
Lactic Acid, Venous: 1.22 mmol/L (ref 0.5–2.0)

## 2015-03-08 LAB — COMPREHENSIVE METABOLIC PANEL
ALT: 12 U/L — AB (ref 17–63)
AST: 22 U/L (ref 15–41)
Albumin: 3.4 g/dL — ABNORMAL LOW (ref 3.5–5.0)
Alkaline Phosphatase: 108 U/L (ref 38–126)
Anion gap: 9 (ref 5–15)
BUN: 13 mg/dL (ref 6–20)
CO2: 25 mmol/L (ref 22–32)
Calcium: 9 mg/dL (ref 8.9–10.3)
Chloride: 103 mmol/L (ref 101–111)
Creatinine, Ser: 0.92 mg/dL (ref 0.61–1.24)
GFR calc non Af Amer: >60 mL/min (ref >60)
Glucose, Bld: 106 mg/dL — ABNORMAL HIGH (ref 70–99)
POTASSIUM: 3.1 mmol/L — AB (ref 3.5–5.1)
SODIUM: 137 mmol/L (ref 135–145)
TOTAL PROTEIN: 6.5 g/dL (ref 6.5–8.1)
Total Bilirubin: 0.8 mg/dL (ref 0.3–1.2)

## 2015-03-08 LAB — URINALYSIS, ROUTINE W REFLEX MICROSCOPIC
BILIRUBIN URINE: NEGATIVE
Glucose, UA: NEGATIVE mg/dL
Ketones, ur: NEGATIVE mg/dL
Leukocytes, UA: NEGATIVE
Nitrite: NEGATIVE
PROTEIN: NEGATIVE mg/dL
Specific Gravity, Urine: 1.016 (ref 1.005–1.030)
UROBILINOGEN UA: 1 mg/dL (ref 0.0–1.0)
pH: 6 (ref 5.0–8.0)

## 2015-03-08 LAB — CBC WITH DIFFERENTIAL/PLATELET
BASOS PCT: 0 % (ref 0–1)
Basophils Absolute: 0 10*3/uL (ref 0.0–0.1)
Eosinophils Absolute: 0.2 10*3/uL (ref 0.0–0.7)
Eosinophils Relative: 2 % (ref 0–5)
HCT: 32.9 % — ABNORMAL LOW (ref 39.0–52.0)
Hemoglobin: 10.9 g/dL — ABNORMAL LOW (ref 13.0–17.0)
LYMPHS PCT: 15 % (ref 12–46)
Lymphs Abs: 1.5 10*3/uL (ref 0.7–4.0)
MCH: 29.9 pg (ref 26.0–34.0)
MCHC: 33.1 g/dL (ref 30.0–36.0)
MCV: 90.1 fL (ref 78.0–100.0)
MONO ABS: 1.2 10*3/uL — AB (ref 0.1–1.0)
MONOS PCT: 12 % (ref 3–12)
NEUTROS ABS: 7.1 10*3/uL (ref 1.7–7.7)
Neutrophils Relative %: 71 % (ref 43–77)
Platelets: 287 10*3/uL (ref 150–400)
RBC: 3.65 MIL/uL — AB (ref 4.22–5.81)
RDW: 14.7 % (ref 11.5–15.5)
WBC: 10 10*3/uL (ref 4.0–10.5)

## 2015-03-08 LAB — MRSA PCR SCREENING: MRSA by PCR: NEGATIVE

## 2015-03-08 LAB — URINE MICROSCOPIC-ADD ON

## 2015-03-08 LAB — MAGNESIUM: MAGNESIUM: 1.7 mg/dL (ref 1.7–2.4)

## 2015-03-08 LAB — STREP PNEUMONIAE URINARY ANTIGEN: Strep Pneumo Urinary Antigen: NEGATIVE

## 2015-03-08 MED ORDER — ACETAMINOPHEN 650 MG RE SUPP: 650.0000 mg | Freq: Four times a day (QID) | RECTAL | Status: DC | PRN

## 2015-03-08 MED ORDER — DEXTROSE 5 % IV SOLN
1.0000 g | Freq: Three times a day (TID) | INTRAVENOUS | Status: DC
  Administered 2015-03-08: 1 g via INTRAVENOUS
  Filled 2015-03-08 (×3): qty 1

## 2015-03-08 MED ORDER — DEXTROSE 5 % IV SOLN
2.0000 g | Freq: Two times a day (BID) | INTRAVENOUS | Status: DC
  Administered 2015-03-09 – 2015-03-11 (×6): 2 g via INTRAVENOUS
  Filled 2015-03-08 (×7): qty 2

## 2015-03-08 MED ORDER — ONDANSETRON HCL 4 MG/2ML IJ SOLN: 4.0000 mg | Freq: Four times a day (QID) | INTRAMUSCULAR | Status: DC | PRN

## 2015-03-08 MED ORDER — ONDANSETRON HCL 4 MG PO TABS: 4.0000 mg | ORAL_TABLET | Freq: Four times a day (QID) | ORAL | Status: DC | PRN

## 2015-03-08 MED ORDER — VANCOMYCIN HCL IN DEXTROSE 750-5 MG/150ML-% IV SOLN
750.0000 mg | Freq: Two times a day (BID) | INTRAVENOUS | Status: DC
  Administered 2015-03-08 – 2015-03-11 (×6): 750 mg via INTRAVENOUS
  Filled 2015-03-08 (×8): qty 150

## 2015-03-08 MED ORDER — VANCOMYCIN HCL IN DEXTROSE 1-5 GM/200ML-% IV SOLN
1000.0000 mg | Freq: Once | INTRAVENOUS | Status: AC
  Administered 2015-03-08: 1000 mg via INTRAVENOUS
  Filled 2015-03-08: qty 200

## 2015-03-08 MED ORDER — SODIUM CHLORIDE 0.9 % IV BOLUS (SEPSIS)
500.0000 mL | INTRAVENOUS | Status: AC
  Administered 2015-03-08: 500 mL via INTRAVENOUS

## 2015-03-08 MED ORDER — SODIUM CHLORIDE 0.9 % IJ SOLN
3.0000 mL | Freq: Two times a day (BID) | INTRAMUSCULAR | Status: DC
  Administered 2015-03-08 – 2015-03-11 (×3): 3 mL via INTRAVENOUS

## 2015-03-08 MED ORDER — MOMETASONE FURO-FORMOTEROL FUM 200-5 MCG/ACT IN AERO
2.0000 | INHALATION_SPRAY | Freq: Two times a day (BID) | RESPIRATORY_TRACT | Status: DC
Start: 2015-03-08 — End: 2015-03-09
  Administered 2015-03-08 – 2015-03-09 (×2): 2 via RESPIRATORY_TRACT
  Filled 2015-03-08: qty 8.8

## 2015-03-08 MED ORDER — ACETAMINOPHEN 325 MG PO TABS: 650.0000 mg | ORAL_TABLET | Freq: Four times a day (QID) | ORAL | Status: DC | PRN

## 2015-03-08 MED ORDER — SODIUM CHLORIDE 0.9 % IV BOLUS (SEPSIS)
1000.0000 mL | INTRAVENOUS | Status: AC
  Administered 2015-03-08 (×2): 1000 mL via INTRAVENOUS

## 2015-03-08 MED ORDER — POTASSIUM CHLORIDE IN NACL 20-0.9 MEQ/L-% IV SOLN
INTRAVENOUS | Status: AC
  Administered 2015-03-08: 15:00:00 via INTRAVENOUS
  Administered 2015-03-09: 75 mL/h via INTRAVENOUS
  Filled 2015-03-08 (×2): qty 1000

## 2015-03-08 MED ORDER — LEVALBUTEROL HCL 0.63 MG/3ML IN NEBU: 0.6300 mg | INHALATION_SOLUTION | Freq: Four times a day (QID) | RESPIRATORY_TRACT | Status: DC | PRN

## 2015-03-08 MED ORDER — DEXTROSE 5 % IV SOLN
2.0000 g | Freq: Once | INTRAVENOUS | Status: AC
  Administered 2015-03-08: 2 g via INTRAVENOUS
  Filled 2015-03-08: qty 2

## 2015-03-08 NOTE — ED Provider Notes (Signed)
CSN: [id number]     Arrival date & time 03/08/15  0909 History   First MD Initiated Contact with Patient 03/08/15 [phone]     Chief Complaint  Patient presents with  . Altered Mental Status  . Fever     (Consider location/radiation/quality/duration/timing/severity/associated sxs/prior Treatment) HPI Comments: 81 yo male with hx of dementia presenting from nursing facility secondary to [place].  Pt unable to provide any history.  Level V Caveat due to AMS.  Patient is a 81 y.o. male presenting with altered mental status.  Altered Mental Status Presenting symptoms: lethargy and partial responsiveness   Severity:  Severe Most recent episode:  Yesterday Episode history:  Single Duration:  2 days Timing:  Constant Progression:  Unchanged Chronicity:  New Context: nursing home resident   Context comment:  Became less responsive yesterday, given rocephin empirically at [facility] Associated symptoms comment:  Per EMS report, tachypnea, cough   Past Medical History  Diagnosis Date  . Asthma   . Hypertension   . A-fib   . Hyperlipidemia   . Shortness of breath   . CHF (congestive heart failure) 10/28/2011  . Forgetfulness   . PVC's (premature ventricular contractions)   . Mild aortic insufficiency   . Dementia     [name] 02/05/2015  . Failure to thrive in adult     /notes 02/05/2015  . COPD (chronic obstructive pulmonary disease)   . Hypothyroidism     [name] 02/05/2015  . Fall 02/04/2015    with altered mental status off of his baseline after he suffered a mechanical fall /notes 02/04/2015  . CAP (community acquired pneumonia)     [name] 02/05/2015  . Colon cancer    Past Surgical History  Procedure Laterality Date  . Colon surgery      Partial hemecolectomy   . Central line insertion  09/08/2011        Family History  Problem Relation Age of Onset  . Hyperlipidemia Father   . Hypertension Father   . Heart disease Father   . Asthma Father   . Stroke Father   . Heart failure Father   .  Heart disease Mother   . Heart failure Mother   . Heart attack Sister    History  Substance Use Topics  . 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 Smokeless tobacco: Not on file     Comment: quit 30+ yrs ago  . Alcohol Use: No    Review of Systems  Unable to perform ROS: Mental status change      Allergies  Review of patient's allergies indicates no known allergies.  Home Medications   Prior to Admission medications   Medication Sig Start Date End Date Taking? Authorizing Provider  ADVAIR DISKUS 500-50 MCG/DOSE AEPB INHALE 1 PUFF TWICE A DAY 12/23/14  Yes Susy Frizzle, MD  ferrous sulfate 325 (65 FE) MG tablet TAKE 1 TABLET BY MOUTH EVERY DAY 12/23/14  Yes Susy Frizzle, MD  fluticasone [facility]) 50 MCG/ACT nasal spray Place 2 sprays into both nostrils daily.   Yes Historical Provider, MD  levothyroxine (SYNTHROID, LEVOTHROID) 25 MCG tablet Take 25 mcg by mouth daily.   Yes Historical Provider, MD  losartan (COZAAR) 50 MG tablet Take 50 mg by mouth daily. 01/23/15  Yes Historical Provider, MD  omeprazole (PRILOSEC) 40 MG capsule Take 40 mg by mouth daily.   Yes Historical Provider, MD  pravastatin (PRAVACHOL) 20 MG tablet Take 20 mg by mouth daily.   Yes Historical Provider, MD  rivaroxaban (XARELTO) 20 MG TABS tablet Take 20 mg by mouth every morning.    Yes Historical Provider, MD  venlafaxine XR (EFFEXOR XR) 37.5 MG 24 hr capsule 2 pills in AM Patient taking differently: Take 75 mg by mouth daily with breakfast. 2 pills in AM 01/13/15  Yes Susy Frizzle, MD  feeding supplement, ENSURE ENLIVE, (ENSURE ENLIVE) LIQD Take 237 mLs by mouth 2 (two) times daily between meals. Patient not taking: Reported on 03/08/2015 02/07/15   [name] Poisson, MD  levofloxacin (LEVAQUIN) 750 MG tablet Take 1 tablet (750 mg total) by mouth daily. Patient not taking: Reported on 03/08/2015 02/08/15   [name] Poisson, MD   BP 133/57 mmHg  Pulse 71  Temp(Src) 101.1 F (38.4 C) (Rectal)  Resp 20  SpO2  95% Physical Exam  Constitutional: He appears well-developed and well-nourished. He appears lethargic. No distress.  HENT:  Head: Normocephalic and atraumatic.  Mouth/Throat: Oropharynx is clear and moist.  Eyes: Conjunctivae are normal. Pupils are equal, round, and reactive to light. No scleral icterus.  Neck: Neck supple.  Cardiovascular: Normal rate, regular rhythm, normal heart sounds and intact distal pulses.   No murmur heard. Pulmonary/Chest: Effort normal. No stridor. No respiratory distress. He has no wheezes. He has rales (diffuse).  Abdominal: Soft. He exhibits no distension. There is no tenderness.  Musculoskeletal: Normal range of motion. He exhibits no edema.  Neurological: He appears lethargic. GCS eye subscore is 1. GCS verbal subscore is 1. GCS motor subscore is 5.  Skin: Skin is warm and dry. No rash noted.  Psychiatric: He has a normal mood and affect. His behavior is normal.  Nursing note and vitals reviewed.   ED Course  Procedures (including critical care time) Labs Review Labs Reviewed  COMPREHENSIVE METABOLIC PANEL - Abnormal; Notable for the following:    Potassium 3.1 (*)    Glucose, Bld 106 (*)    Albumin 3.4 (*)    ALT 12 (*)    All other components within normal limits  CBC WITH DIFFERENTIAL/PLATELET - Abnormal; Notable for the following:    RBC 3.65 (*)    Hemoglobin 10.9 (*)    HCT 32.9 (*)    Monocytes Absolute 1.2 (*)    All other components within normal limits  URINALYSIS, ROUTINE W REFLEX MICROSCOPIC - Abnormal; Notable for the following:    Hgb urine dipstick TRACE (*)    All other components within normal limits  CULTURE, BLOOD (ROUTINE X 2)  CULTURE, BLOOD (ROUTINE X 2)  URINE CULTURE  MRSA PCR SCREENING  URINE MICROSCOPIC-ADD ON  MAGNESIUM  BLOOD GAS, ARTERIAL  LEGIONELLA ANTIGEN, URINE  STREP PNEUMONIAE URINARY ANTIGEN  I-STAT CG4 LACTIC ACID, ED  I-STAT CG4 LACTIC ACID, ED  I-STAT CG4 LACTIC ACID, ED    Imaging Review  Dg  Chest Portable 1 View  03/08/2015   CLINICAL DATA:  Code sepsis.Per ED note: Ar[facility] EMS; sent for altered mental status starting yesterday. Usually alert and talks; resting with eyes closed and groans to stimulation. [facility] suspected UTI yesterday and gave Rocephin yesterdayH/o COPD, CHF, HTN, a-fib, asthma, dementia.  EXAM: PORTABLE CHEST - 1 VIEW  COMPARISON:  02/07/2015  FINDINGS: There is increased opacity at the right lung base when compared the prior study with decreased opacity in the right upper lobe. There persistent thickened interstitial markings bilaterally. Cardiac silhouette remains mildly enlarged. No pneumothorax.  IMPRESSION: 1. Increased facets right lung base when compared the prior study. This may be  due to atelectasis, pneumonia or a combination. 2. Improved opacity previously noted in the right upper lobe. 3. Persistent mild thickening of the interstitial markings without overt pulmonary edema. 4. Mild persistent cardiomegaly.   Electronically Signed   By: Lajean Manes M.D.   On: 03/08/2015 09:53     EKG Interpretation   Date/Time:  Saturday Mar 08 2015 09:17:32 EDT Ventricular Rate:  77 PR Interval:    QRS Duration: 97 QT Interval:  424 QTC Calculation: 480 R Axis:   -40 Text Interpretation:  Atrial fibrillation Left axis deviation Anteroseptal  infarct, age indeterminate Nonspecific T abnormalities, lateral leads  Confirmed by [facility]  MD, TREY ([id number]) on 03/08/2015 10:25:41 AM      MDM   Final diagnoses:  Fever  HCAP  81 yo male with hx of dementia presenting with AMS and fever from NF.  Code sepsis initiated.  Diffuse rales on exam.  Covered empirically for HCAP with Vanco and Cefepime.    ADmitted to Internal Medicine.    Serita Grit, MD 03/08/15 [phone]

## 2015-03-08 NOTE — ED Notes (Signed)
Radiology at bedside for chest port.

## 2015-03-08 NOTE — ED Notes (Signed)
Paged out level two code sepsis @ 9:35am

## 2015-03-08 NOTE — H&P (Signed)
[facility] History and Physical  Todd [name] XJO:[id number] DOB: 1924-05-27 DOA: 03/08/2015   PCP: Odette Fraction, MD  Specialists: Dr. Harrington Challenger is his cardiologist. Although he has not seen her in a long time  Chief Complaint: Altered mental status, cough, fever  HPI: Todd [name] is a 81 y.o. male with a past medical history of atrial fibrillation, COPD, remote history of colon cancer, who was recently hospitalized about a month ago and was treated for pneumonia. He was discharged to skilled nursing facility. His daughter is at bedside. She tells me that he was actually doing quite okay. Ever since his wife died about 6 months ago patient's overall functional capacity has been diminishing. He has shown decline. He has found it difficult to ambulate on his on. So now he gets around in a wheelchair. But otherwise he was eating well and talking and was otherwise in reasonable health. And then about 2-3 days ago they noticed that he was getting more confused. He was talking out of his head. His oral intake diminished. And then yesterday he was given ceftriaxone at the nursing facility as they suspected he may have had a UTI. This morning he was not easily arousable and so he was sent over to the hospital for evaluation. Patient is unable to provide any history due to his encephalopathy.  Home Medications: Prior to Admission medications   Medication Sig Start Date End Date Taking? Authorizing Provider  ADVAIR DISKUS 500-50 MCG/DOSE AEPB INHALE 1 PUFF TWICE A DAY 12/23/14  Yes Susy Frizzle, MD  ferrous sulfate 325 (65 FE) MG tablet TAKE 1 TABLET BY MOUTH EVERY DAY 12/23/14  Yes Susy Frizzle, MD  fluticasone [facility]) 50 MCG/ACT nasal spray Place 2 sprays into both nostrils daily.   Yes Historical Provider, MD  levothyroxine (SYNTHROID, LEVOTHROID) 25 MCG tablet Take 25 mcg by mouth daily.   Yes Historical Provider, MD  losartan (COZAAR) 50 MG tablet Take 50 mg by mouth daily.  01/23/15  Yes Historical Provider, MD  omeprazole (PRILOSEC) 40 MG capsule Take 40 mg by mouth daily.   Yes Historical Provider, MD  pravastatin (PRAVACHOL) 20 MG tablet Take 20 mg by mouth daily.   Yes Historical Provider, MD  rivaroxaban (XARELTO) 20 MG TABS tablet Take 20 mg by mouth every morning.    Yes Historical Provider, MD  venlafaxine XR (EFFEXOR XR) 37.5 MG 24 hr capsule 2 pills in AM Patient taking differently: Take 75 mg by mouth daily with breakfast. 2 pills in AM 01/13/15  Yes Susy Frizzle, MD  feeding supplement, ENSURE ENLIVE, (ENSURE ENLIVE) LIQD Take 237 mLs by mouth 2 (two) times daily between meals. Patient not taking: Reported on 03/08/2015 02/07/15   [name] Poisson, MD  levofloxacin (LEVAQUIN) 750 MG tablet Take 1 tablet (750 mg total) by mouth daily. Patient not taking: Reported on 03/08/2015 02/08/15   [name] Poisson, MD    Allergies: No Known Allergies  Past Medical History: Past Medical History  Diagnosis Date  . Asthma   . Hypertension   . A-fib   . Hyperlipidemia   . Shortness of breath   . CHF (congestive heart failure) 10/28/2011  . Forgetfulness   . PVC's (premature ventricular contractions)   . Mild aortic insufficiency   . Dementia     [name] 02/05/2015  . Failure to thrive in adult     /notes 02/05/2015  . COPD (chronic obstructive pulmonary disease)   . Hypothyroidism     [name] 02/05/2015  .  Fall 02/04/2015    with altered mental status off of his baseline after he suffered a mechanical fall /notes 02/04/2015  . CAP (community acquired pneumonia)     [name] 02/05/2015  . Colon cancer     Past Surgical History  Procedure Laterality Date  . Colon surgery      Partial hemecolectomy   . Central line insertion  09/08/2011         Social History: He currently resides in a skilled nursing facility. Gets around in a wheelchair. No smoking or alcohol use currently.  Family History:  Family History  Problem Relation Age of Onset  . Hyperlipidemia Father   .  Hypertension Father   . Heart disease Father   . Asthma Father   . Stroke Father   . Heart failure Father   . Heart disease Mother   . Heart failure Mother   . Heart attack Sister      Review of Systems - unable to do due to acute encephalopathy  Physical Examination  Filed Vitals:   03/08/15 1001 03/08/15 1045 03/08/15 1100 03/08/15 1115  BP:  143/51 111/50 111/76  Pulse:  53 62 76  Temp:  99.9 F (37.7 C) 99.5 F (37.5 C) 99.1 F (37.3 C)  TempSrc:      Resp:  25 21 29   Height: 5\' 7"  (1.702 m)     Weight: 69.4 kg (153 lb)     SpO2:  97% 98% 98%    BP 111/76 mmHg  Pulse 76  Temp(Src) 99.1 F (37.3 C) (Rectal)  Resp 29  Ht 5\' 7"  (1.702 m)  Wt 69.4 kg (153 lb)  BMI 23.96 kg/m2  SpO2 98%  General appearance: Sometimes responsive to voice. Did open his eyes once. Otherwise, does not communicate. No distress. Head: Normocephalic, without obvious abnormality, atraumatic Eyes: conjunctivae/corneas clear. PERRL.  Throat: Very dry mucous membranes. Neck: no adenopathy, no carotid bruit, no JVD, supple, symmetrical, trachea midline, thyroid not enlarged, symmetric, no tenderness/mass/nodules and Soft. Reasonably good passive range of motion. Resp: Course breath sounds bilaterally with crackles at the right base. No definite rhonchi or wheezing currently. Cardio: regular rate and rhythm, S1, S2 normal, no murmur, click, rub or gallop GI: soft, non-tender; bowel sounds normal; no masses,  no organomegaly Extremities: extremities normal, atraumatic, no cyanosis or edema and A small wound in the plantar aspect of the right first toe. Pulses: 2+ and symmetric Skin: Skin color, texture, turgor normal. No rashes or lesions Lymph nodes: Cervical, supraclavicular, and axillary nodes normal. Neurologic: Responsive sometimes toward this and the other times to pain. No gag reflux. Noted to move all his extremities at one time or the other. Occasional tremors noted.  Laboratory  Data: Results for orders placed or performed during the hospital encounter of 03/08/15 (from the past 48 hour(s))  Comprehensive metabolic panel     Status: Abnormal   Collection Time: 03/08/15  9:34 AM  Result Value Ref Range   Sodium 137 135 - 145 mmol/L   Potassium 3.1 (L) 3.5 - 5.1 mmol/L   Chloride 103 101 - 111 mmol/L   CO2 25 22 - 32 mmol/L   Glucose, Bld 106 (H) 70 - 99 mg/dL   BUN 13 6 - 20 mg/dL   Creatinine, Ser 0.92 0.61 - 1.24 mg/dL   Calcium 9.0 8.9 - 10.3 mg/dL   Total Protein 6.5 6.5 - 8.1 g/dL   Albumin 3.4 (L) 3.5 - 5.0 g/dL   AST 22 15 -  41 U/L   ALT 12 (L) 17 - 63 U/L   Alkaline Phosphatase 108 38 - 126 U/L   Total Bilirubin 0.8 0.3 - 1.2 mg/dL   GFR calc non Af Amer >60 >60 mL/min   GFR calc Af Amer >60 >60 mL/min    Comment: (NOTE) The eGFR has been calculated using the CKD EPI equation. This calculation has not been validated in all clinical situations. eGFR's persistently <60 mL/min signify possible Chronic Kidney Disease.    Anion gap 9 5 - 15  CBC with Differential     Status: Abnormal   Collection Time: 03/08/15  9:34 AM  Result Value Ref Range   WBC 10.0 4.0 - 10.5 K/uL   RBC 3.65 (L) 4.22 - 5.81 MIL/uL   Hemoglobin 10.9 (L) 13.0 - 17.0 g/dL   HCT 32.9 (L) 39.0 - 52.0 %   MCV 90.1 78.0 - 100.0 fL   MCH 29.9 26.0 - 34.0 pg   MCHC 33.1 30.0 - 36.0 g/dL   RDW 14.7 11.5 - 15.5 %   Platelets 287 150 - 400 K/uL   Neutrophils Relative % 71 43 - 77 %   Neutro Abs 7.1 1.7 - 7.7 K/uL   Lymphocytes Relative 15 12 - 46 %   Lymphs Abs 1.5 0.7 - 4.0 K/uL   Monocytes Relative 12 3 - 12 %   Monocytes Absolute 1.2 (H) 0.1 - 1.0 K/uL   Eosinophils Relative 2 0 - 5 %   Eosinophils Absolute 0.2 0.0 - 0.7 K/uL   Basophils Relative 0 0 - 1 %   Basophils Absolute 0.0 0.0 - 0.1 K/uL  I-Stat CG4 Lactic Acid, ED (Not at [facility] or [facility])     Status: None   Collection Time: 03/08/15  9:41 AM  Result Value Ref Range   Lactic Acid, Venous 0.95 0.5 - 2.0 mmol/L   Urinalysis, Routine w reflex microscopic     Status: Abnormal   Collection Time: 03/08/15 10:10 AM  Result Value Ref Range   Color, Urine YELLOW YELLOW   APPearance CLEAR CLEAR   Specific Gravity, Urine 1.016 1.005 - 1.030   pH 6.0 5.0 - 8.0   Glucose, UA NEGATIVE NEGATIVE mg/dL   Hgb urine dipstick TRACE (A) NEGATIVE   Bilirubin Urine NEGATIVE NEGATIVE   Ketones, ur NEGATIVE NEGATIVE mg/dL   Protein, ur NEGATIVE NEGATIVE mg/dL   Urobilinogen, UA 1.0 0.0 - 1.0 mg/dL   Nitrite NEGATIVE NEGATIVE   Leukocytes, UA NEGATIVE NEGATIVE  Urine microscopic-add on     Status: None   Collection Time: 03/08/15 10:10 AM  Result Value Ref Range   RBC / HPF 3-6 <3 RBC/hpf    Radiology Reports: Dg Chest Portable 1 View  03/08/2015   CLINICAL DATA:  Code sepsis.Per ED note: Arrives from [facility] via EMS; sent for altered mental status starting yesterday. Usually alert and talks; resting with eyes closed and groans to stimulation. [facility] suspected UTI yesterday and gave Rocephin yesterdayH/o COPD, CHF, HTN, a-fib, asthma, dementia.  EXAM: PORTABLE CHEST - 1 VIEW  COMPARISON:  02/07/2015  FINDINGS: There is increased opacity at the right lung base when compared the prior study with decreased opacity in the right upper lobe. There persistent thickened interstitial markings bilaterally. Cardiac silhouette remains mildly enlarged. No pneumothorax.  IMPRESSION: 1. Increased facets right lung base when compared the prior study. This may be due to atelectasis, pneumonia or a combination. 2. Improved opacity previously noted in the right upper lobe. 3. Persistent  mild thickening of the interstitial markings without overt pulmonary edema. 4. Mild persistent cardiomegaly.   Electronically Signed   By: Lajean Manes M.D.   On: 03/08/2015 09:53    Electrocardiogram: Atrial fibrillation, the 70s. Left axis deviation. PVC. Nonspecific ST changes. Similar to previous EKG.  Problem List  Principal Problem:   HCAP  (healthcare-associated pneumonia) Active Problems:   Atrial fibrillation   Chronic diastolic CHF (congestive heart failure)   Protein-calorie malnutrition, severe   Acute encephalopathy   Assessment: This is a 81 year old Caucasian male who was recently hospitalized and treated for community-acquired pneumonia. He was discharged to a skilled nursing facility. Comes in with altered mental status, low-grade fever and perhaps a cough. Chest X-ray reveals possible infiltrate in the right lower lung. Other possibilities for his altered mental status include hypercapnia. Meningitis appears to be unlikely based on examination and other data. Dehydration could also be contributing.  Plan: #1 acute encephalopathy: Most likely secondary to pneumonia. CT head and ABG will be obtained. He'll be kept nothing by mouth. He'll be monitored in step down unit for now.  #2 Healthcare associated pneumonia: Treat with the vancomycin and cefepime. Blood cultures will be followed up on. Lactic acid level is normal. Does not need oxygen currently.  #3 Dehydration/hypokalemia: IV fluids. He does have a history of for diastolic dysfunction, so caution should be employed. Check magnesium.  #4 History of atrial fibrillation: He is on anticoagulation, which will be held for now. Heart rate is reasonably well controlled.  #5 Severe protein calorie malnutrition: Resume diet when he is more awake and alert. May need to be seen by nutritionist as well.  Patient's family tells me that he has been progressively declining over the past many months. This is his second hospitalization in the last month and a half. Depending on how he does during this hospitalization he may be a candidate for palliative medicine consultation for goals of care. This can be determined over the next 24-48 hours.  DVT Prophylaxis: SCDs. He is on full anticoagulation, which is being held for now. Code Status: DO NOT RESUSCITATE. This was confirmed with  the daughter Family Communication: Discussed with the patient's daughter and son-in-law  Disposition Plan: Admit to stepdown   Further management decisions will depend on results of further testing and patient's response to treatment.   [facility]  [facility] Pager [phone]  If 7PM-7AM, please contact night-coverage [email or web address] Password [email or web address]  03/08/2015, 12:00 PM

## 2015-03-08 NOTE — ED Notes (Signed)
Patient transported to CT 

## 2015-03-08 NOTE — ED Notes (Signed)
Arrives from [place] via EMS; sent for altered mental status starting yesterday. Usually alert and talks; resting with eyes closed and groans to stimulation. [facility] suspected UTI yesterday and gave Rocephin yesterday.

## 2015-03-08 NOTE — ED Notes (Signed)
Patient returned from CT

## 2015-03-08 NOTE — Progress Notes (Signed)
ANTIBIOTIC CONSULT NOTE - INITIAL  Pharmacy Consult for Vancomycin / Cefepime Indication: rule out sepsis  No Known Allergies  Patient Measurements: Adjusted Body Weight:   Vital Signs: Temp: 101.1 F (38.4 C) (05/07 0924) Temp Source: Rectal (05/07 0924) BP: 133/57 mmHg (05/07 0924) Pulse Rate: 71 (05/07 0924)  Labs: No results for input(s): WBC, HGB, PLT, LABCREA, CREATININE in the last 72 hours. CrCl cannot be calculated (Unknown ideal weight.). No results for input(s): VANCOTROUGH, VANCOPEAK, VANCORANDOM, GENTTROUGH, GENTPEAK, GENTRANDOM, TOBRATROUGH, TOBRAPEAK, TOBRARND, AMIKACINPEAK, AMIKACINTROU, AMIKACIN in the last 72 hours.   Microbiology: No results found for this or any previous visit (from the past 720 hour(s)).  Medical History: Past Medical History  Diagnosis Date  . Asthma   . Hypertension   . A-fib   . Hyperlipidemia   . Shortness of breath   . CHF (congestive heart failure) 10/28/2011  . Forgetfulness   . PVC's (premature ventricular contractions)   . Mild aortic insufficiency   . Dementia     [name] 02/05/2015  . Failure to thrive in adult     /notes 02/05/2015  . COPD (chronic obstructive pulmonary disease)   . Hypothyroidism     [name] 02/05/2015  . Fall 02/04/2015    with altered mental status off of his baseline after he suffered a mechanical fall /notes 02/04/2015  . CAP (community acquired pneumonia)     [name] 02/05/2015  . Colon cancer    Medications:  Anti-infectives    Start     Dose/Rate Route Frequency Ordered Stop   03/08/15 0945  ceFEPIme (MAXIPIME) 2 g in dextrose 5 % 50 mL IVPB     2 g 100 mL/hr over 30 Minutes Intravenous  Once 03/08/15 0932     03/08/15 0945  vancomycin (VANCOCIN) IVPB 1000 mg/200 mL premix     1,000 mg 200 mL/hr over 60 Minutes Intravenous  Once 03/08/15 0932       Assessment: 81yo male admitted with altered mental status from [facility].  He was thought to have a UTI yesterday and received one  dose of IV Ceftriaxone.  Code sepsis has been called and he is currently to receive one dose of IV Vancomycin and Cefepime.  I spoke with his nurse and she is getting a weight and then will hang IV antibiotic first doses.  Labs: Creatinine 0.92 with an estimated CrCl of 61ml/min  Goal of Therapy:  Vancomycin trough level 15-20 mcg/ml  Plan:  - Vancomycin 1gm IV x 1 then begin IV Vancomycin 750 mg every 12 hr - Cefepime 2 gm IV x 1 then begin 2 gm IV every 12 hours - Monitor renal function and clinical response to therapy  Rober Minion, PharmD., MS Clinical Pharmacist Pager:  [phone] Thank you for allowing pharmacy to be part of this patients care team. 03/08/2015,9:50 AM

## 2015-03-09 LAB — COMPREHENSIVE METABOLIC PANEL
ALT: 10 U/L — AB (ref 17–63)
ANION GAP: 9 (ref 5–15)
AST: 17 U/L (ref 15–41)
Albumin: 2.6 g/dL — ABNORMAL LOW (ref 3.5–5.0)
Alkaline Phosphatase: 85 U/L (ref 38–126)
BUN: 10 mg/dL (ref 6–20)
CALCIUM: 8.4 mg/dL — AB (ref 8.9–10.3)
CO2: 23 mmol/L (ref 22–32)
Chloride: 106 mmol/L (ref 101–111)
Creatinine, Ser: 0.89 mg/dL (ref 0.61–1.24)
GLUCOSE: 94 mg/dL (ref 70–99)
Potassium: 3 mmol/L — ABNORMAL LOW (ref 3.5–5.1)
Sodium: 138 mmol/L (ref 135–145)
TOTAL PROTEIN: 5.5 g/dL — AB (ref 6.5–8.1)
Total Bilirubin: 0.9 mg/dL (ref 0.3–1.2)

## 2015-03-09 LAB — CBC
HCT: 27.7 % — ABNORMAL LOW (ref 39.0–52.0)
Hemoglobin: 9.2 g/dL — ABNORMAL LOW (ref 13.0–17.0)
MCH: 30.1 pg (ref 26.0–34.0)
MCHC: 33.2 g/dL (ref 30.0–36.0)
MCV: 90.5 fL (ref 78.0–100.0)
PLATELETS: 244 10*3/uL (ref 150–400)
RBC: 3.06 MIL/uL — AB (ref 4.22–5.81)
RDW: 14.9 % (ref 11.5–15.5)
WBC: 7.5 10*3/uL (ref 4.0–10.5)

## 2015-03-09 LAB — URINE CULTURE
CULTURE: NO GROWTH
Colony Count: NO GROWTH

## 2015-03-09 MED ORDER — LEVALBUTEROL HCL 0.63 MG/3ML IN NEBU
0.6300 mg | INHALATION_SOLUTION | RESPIRATORY_TRACT | Status: DC | PRN
  Administered 2015-03-11: 0.63 mg via RESPIRATORY_TRACT
  Filled 2015-03-09: qty 3

## 2015-03-09 MED ORDER — POTASSIUM CHLORIDE IN NACL 20-0.9 MEQ/L-% IV SOLN
INTRAVENOUS | Status: AC
  Administered 2015-03-09: 700 mL via INTRAVENOUS
  Filled 2015-03-09 (×2): qty 1000

## 2015-03-09 MED ORDER — ENOXAPARIN SODIUM 80 MG/0.8ML ~~LOC~~ SOLN
1.0000 mg/kg | Freq: Two times a day (BID) | SUBCUTANEOUS | Status: DC
  Administered 2015-03-09 – 2015-03-11 (×5): 70 mg via SUBCUTANEOUS
  Filled 2015-03-09 (×6): qty 0.8

## 2015-03-09 MED ORDER — IPRATROPIUM BROMIDE 0.02 % IN SOLN: 0.5000 mg | RESPIRATORY_TRACT | Status: DC | PRN

## 2015-03-09 NOTE — Progress Notes (Signed)
Patient trasfered from 2C09 to [phone] via bed; alert and oriented x 2; no complaints of pain; IV in RW running fluids @75cc /hr; skin intact. Dry. Flaky, discoloration bilateral upper extremities, scabs on RLE and abrasion on the bottom of right big toe. Orient patient to room and unit; instructed how to use the call bell and  fall risk precautions. Will continue to monitor the patient.

## 2015-03-09 NOTE — Evaluation (Signed)
Clinical/Bedside Swallow Evaluation Patient Details  Name: Todd [name] MRN: [id number] Date of Birth: [birth year]  Today's Date: 03/09/2015 Time: SLP Start Time (ACUTE ONLY): 1315 SLP Stop Time (ACUTE ONLY): 1330 SLP Time Calculation (min) (ACUTE ONLY): 15 min  Past Medical History:  Past Medical History  Diagnosis Date  . Asthma   . Hypertension   . A-fib   . Hyperlipidemia   . Shortness of breath   . CHF (congestive heart failure) 10/28/2011  . Forgetfulness   . PVC's (premature ventricular contractions)   . Mild aortic insufficiency   . Dementia     [name] 02/05/2015  . Failure to thrive in adult     /notes 02/05/2015  . COPD (chronic obstructive pulmonary disease)   . Hypothyroidism     [name] 02/05/2015  . Fall 02/04/2015    with altered mental status off of his baseline after he suffered a mechanical fall /notes 02/04/2015  . CAP (community acquired pneumonia)     [name] 02/05/2015  . Colon cancer    Past Surgical History:  Past Surgical History  Procedure Laterality Date  . Colon surgery      Partial hemecolectomy   . Central line insertion  09/08/2011        HPI:  This is a 81 year old Caucasian male who was recently hospitalized and treated for community-acquired pneumonia. He was discharged to a skilled nursing facility. Comes in with altered mental status, low-grade fever and perhaps a cough. Chest X-ray reveals possible infiltrate in the right lower lung. Other possibilities for his altered mental status include hypercapnia. Meningitis appears to be unlikely based on examination and other data. Dehydration could also be contributing..  Patient has had previous swallow work up in 2012 with rx for nectar liquids due to silent aspiration of thins.  The patient reports that he just stopped using thickened liquids.   Assessment / Plan / Recommendation Clinical Impression  Clinical evaluation of swallowing was completed.  The patient had previous swallow work up in 2012 after  a CVA.  MBS was completed at that time with documented silent aspiration of thin liquids.  The patient reports he just stopped the thickened liquids on his own.  The patient had a recent admission with PNA and has been re-admitted with probable RLL PNA.  Given clinical presentation and current admission rx dysphagia 3 diet with nectar liquids pending MBS results.      Aspiration Risk  Moderate    Diet Recommendation  (dysphagia 3 with nectar liquids)   Medication Administration: Crushed with puree Compensations: Slow rate;Small sips/bites    Other  Recommendations Oral Care Recommendations: Oral care BID Other Recommendations: Prohibited food (jello, ice cream, thin soups);Remove water pitcher    Swallow Study    General Date of Onset: 03/09/15 Other Pertinent Information: This is a 81 year old Caucasian male who was recently hospitalized and treated for community-acquired pneumonia. He was discharged to a skilled nursing facility. Comes in with altered mental status, low-grade fever and perhaps a cough. Chest X-ray reveals possible infiltrate in the right lower lung. Other possibilities for his altered mental status include hypercapnia. Meningitis appears to be unlikely based on examination and other data. Dehydration could also be contributing..  Patient has had previous swallow work up in 2012 with rx for nectar liquids due to silent aspiration of thins.  The patient reports that he just stopped using thickened liquids. Type of Study: Bedside swallow evaluation Previous Swallow Assessment: 2012 - MBS Diet Prior to this Study:  NPO Temperature Spikes Noted: Yes Respiratory Status: Room air History of Recent Intubation: No Behavior/Cognition: Alert;Cooperative;Pleasant mood Oral Cavity - Dentition: Poor condition;Missing dentition Self-Feeding Abilities: Needs assist Patient Positioning: Upright in bed Baseline Vocal Quality: Normal Volitional Cough: Weak Volitional Swallow: Able to  elicit    Oral/Motor/Sensory Function Overall Oral Motor/Sensory Function: Appears within functional limits for tasks assessed Labial ROM: Within Functional Limits Labial Symmetry: Within Functional Limits Labial Strength: Within Functional Limits Lingual ROM: Within Functional Limits Lingual Symmetry: Within Functional Limits Lingual Strength: Within Functional Limits Facial ROM: Within Functional Limits Facial Symmetry: Within Functional Limits Mandible: Within Functional Limits   Ice Chips Ice chips: Not tested   Thin Liquid Thin Liquid: Impaired Presentation: Spoon Oral Phase Impairments: Impaired anterior to posterior transit Oral Phase Functional Implications: Prolonged oral transit Pharyngeal  Phase Impairments: Suspected delayed Swallow    Nectar Thick Nectar Thick Liquid: Within functional limits Presentation: Cup;Self Fed;Spoon   Honey Thick Honey Thick Liquid: Not tested   Puree Puree: Within functional limits Presentation: Spoon   Solid   GO    Solid: Impaired (solid mixed with puree) Presentation: Spoon Oral Phase Impairments: Impaired anterior to posterior transit Oral Phase Functional Implications:  (delayed oral transit)       Todd [name] N 03/09/2015,1:45 PM  Todd Flatten, MA, [place] [phone]'

## 2015-03-09 NOTE — Progress Notes (Signed)
Tele monitor showed Irregular HR with multiform PVCs. Patient resting quietly; no S/S of acute distress. MD notified; no new orders at this time. Will continue to monitor.

## 2015-03-09 NOTE — Evaluation (Signed)
Physical Therapy Evaluation Patient Details Name: Todd [name] MRN: [id number] DOB: 1924-04-01 Today's Date: 03/09/2015   History of Present Illness  Patient is an 81 y/o male with history of DM, A-fib on xarelto, asthma, [place], HTN, HLD, hypothyroid admtted with AMS; + pneumonia, sepsis, hypokalemia  Clinical Impression  Pt admitted with above diagnosis. Pt easily arousable, however quickly falls asleep if not engaged in conversation or activity. Able to complete bed level assessment and exercises. Hopeful for improved tolerance/participation as mental status improves. Noted hypokalemia persists. Pt currently with functional limitations due to the deficits listed below (see PT Problem List). Pt will benefit from skilled PT to increase their independence and safety with mobility to allow discharge to the venue listed below.       Follow Up Recommendations SNF;Supervision/Assistance - 24 hour    Equipment Recommendations  None recommended by PT    Recommendations for Other Services       Precautions / Restrictions Precautions Precautions: Fall Restrictions Weight Bearing Restrictions: No      Mobility  Bed Mobility Overal bed mobility: Needs Assistance Bed Mobility: Rolling Rolling: Min assist         General bed mobility comments: rolls Lt with min assist and max cues with rail; pt prefers to lie on his right and rolls with rail and supervision  Transfers                 General transfer comment: not tested due to lethargy  Ambulation/Gait                Stairs            Wheelchair Mobility    Modified Rankin (Stroke Patients Only)       Balance Overall balance assessment: History of Falls                                           Pertinent Vitals/Pain Pain Assessment: Faces Faces Pain Scale: No hurt    Home Living Family/patient expects to be discharged to:: Skilled nursing facility                  Additional Comments: no family present; recently d/c'd to SNF from hospital    Prior Function           Comments: PLOF unknown as no family present.      Hand Dominance        Extremity/Trunk Assessment   Upper Extremity Assessment: Overall WFL for tasks assessed (bil biceps 4/5; triceps 4/5)           Lower Extremity Assessment: RLE deficits/detail;LLE deficits/detail RLE Deficits / Details: AAROM WFL; hip/knee extension in supine 3+ LLE Deficits / Details: hip/knee AAROM WFL; ankle limited DF to neutral; hip/knee extension 3+/5     Communication   Communication: HOH  Cognition Arousal/Alertness: Lethargic Behavior During Therapy: Flat affect Overall Cognitive Status: No family/caregiver present to determine baseline cognitive functioning                      General Comments      Exercises General Exercises - Lower Extremity Ankle Circles/Pumps: AROM;Both;10 reps Heel Slides: AAROM;Strengthening;Both;10 reps (assisted flexion; resisted extension)      Assessment/Plan    PT Assessment Patient needs continued PT services  PT Diagnosis Generalized weakness;Altered mental status   PT Problem List Decreased  strength;Decreased activity tolerance;Decreased balance;Decreased mobility;Decreased cognition;Decreased knowledge of use of DME;Decreased safety awareness  PT Treatment Interventions DME instruction;Gait training;Functional mobility training;Therapeutic activities;Therapeutic exercise;Balance training;Cognitive remediation;Patient/family education   PT Goals (Current goals can be found in the Care Plan section) Acute Rehab PT Goals Patient Stated Goal: pt unable due to AMS PT Goal Formulation: Patient unable to participate in goal setting Time For Goal Achievement: 03/21/15 Potential to Achieve Goals: Fair    Frequency Min 2X/week   Barriers to discharge        Co-evaluation               End of Session   Activity Tolerance:  Patient limited by lethargy Patient left: in bed;with call bell/phone within reach;with nursing/sitter in room (near chair position) Nurse Communication: Mobility status         Time: 1572-6203 PT Time Calculation (min) (ACUTE ONLY): 18 min   Charges:   PT Evaluation $Initial PT Evaluation Tier I: 1 Procedure     PT G Codes:        Om Lizotte Mar 30, 2015, 3:48 PM Pager [phone]

## 2015-03-09 NOTE — Progress Notes (Signed)
Patient ID: Todd [name], male   DOB: 1924/05/19, 81 y.o.   MRN: [id number] [facility] PROGRESS NOTE  Todd [name] PIR:[id number] DOB: [birth year] DOA: 03/08/2015 PCP: Odette Fraction, MD  Brief narrative:    81 year old male with past medical history significant for atrial fibrillation on anticoagulation with xarelto, remote history of colon cancer, COPD, hypothyroidism, hypertension who was recently treated for pneumonia and subsequently discharged to skilled nursing facility. Family is not present at the bedside this morning to provide further details but they were present at the time of the admission and reported that patient was doing fine until his wife has passed away about 6 months prior to this admission. His functional capacity has significantly declined and he could no longer care for himself or ambulate without the assistance. He mostly gets around with wheelchair. His by mouth intake is still relatively okay. Per family, he was noted to be more confused, altered and his by mouth intake inclined as well. He was given Rocephin in a nursing home because of the suspicion that he may have urinary tract infection. He was subsequently brought to ED for evaluation because he was lethargic and hard to wake up. On admission, patient was febrile with T max 101.1 F, oxygen saturation of 90% with nasal cannula oxygen support, RR max 31, HR 53-100. Blood work was notable for hemoglobin of 10.9, potassium 3.1 which was supplemented, normal creatinine. His glucose was 106. Chest x-ray showed increased opacity in the right lung base compared to prior studies which could represent combination of atelectasis and pneumonia. CT head did not show acute intracranial abnormalities. Patient was admitted to stepdown unit for further evaluation and treatment of healthcare associated pneumonia.  Barrier to discharge: Patient is lethargic and does not respond to verbal stimuli. We need to palliative care to  address goals of care including the disposition plan. Patient is not stable for skilled nursing facility placement at this time. We will transfer to telemetry floor today.  Assessment/Plan:    Principal Problem: Acute respiratory failure with hypoxia  / acute COPD exacerbation / healthcare associated pneumonia - Hypoxia on the admission likely secondary to combination of healthcare associated pneumonia as well as COPD exacerbation. - Chest x-ray on the admission showed increased opacity in the right lung base which could possibly represent pneumonia. - Respiratory status is stable this morning. - Patient was started empirically on cefepime and vancomycin for treatment of healthcare associated pneumonia. - Strep pneumoniae is negative. Legionella is pending. Blood cultures so far show no growth to date. - Appreciate palliative care consult for goals of care. - Patient is hemodynamically stable to be transferred out of the stepdown unit to telemetry unit.  Active Problems: Sepsis secondary to pneumonia / right lower lobe pneumonia - Sepsis criteria met on the admission with fever, hypoxia, tachypnea. Source of infection is pneumonia as seen on chest x-ray. Lactic acid was within normal limits. White blood cell count was within normal limits. - Continue vancomycin and cefepime which were started on the admission. - Blood cultures so far are negative.  Atrial fibrillation, chronic - CHADS vasc score at least 4 - On anticoagulation with xarelto but because patient unable to take by mouth we will switch to Lovenox per pharmacy dosing. - Rate controlled at 72.  Iron deficiency anemia / anemia of chronic disease - Anemia likely secondary to combination of iron deficiency and chronic disease. He is on anticoagulation which could contribute to anemia. - Hemoglobin is 9.2 this morning.  No reports of bleeding. - No current indications for transfusion.  Hypokalemia - Likely due to poor by mouth  intake. Supplemented through IV fluids.  Essential hypertension - Patient is on losartan at home but this was placed on hold since blood pressure is 118/58.  Acute encephalopathy / generalized weakness / failure to thrive - No significant changes in mental status since the admission. - PT evaluation order placed but may not be done since patient still acutely ill, lethargic   Severe protein calorie malnutrition - In the context of chronic illness. Nutrition consulted. Please note that patient is nothing by mouth, he is lethargic and not able to tolerate by mouth intake. - Continue IV fluids for hydration.    DVT Prophylaxis  - Lovenox subcutaneous ordered.   Code Status: DNR/DNI Family Communication:  Family not at the bedside this morning. Disposition Plan: transfer to telemetry floor; not sure of exact anticipated d/c date since we need palliative care to address goals of care, as of now patient is not able to go back to SNF, he is lethargic, does not respond to verbal stimuli, has no PO intake.   IV access:  Peripheral IV  Procedures and diagnostic studies:    Ct Head Wo Contrast 03/08/2015  1. No acute intracranial abnormalities. 2. No change from the prior exam.  Atrophy and old infarcts.     Dg Chest Portable 1 View 03/08/2015  1. Increased facets right lung base when compared the prior study. This may be due to atelectasis, pneumonia or a combination. 2. Improved opacity previously noted in the right upper lobe. 3. Persistent mild thickening of the interstitial markings without overt pulmonary edema. 4. Mild persistent cardiomegaly.     Medical Consultants:  Palliative care   Other Consultants:  PT/ OT SLP Nutrition  IAnti-Infectives:   Vancomycin 03/08/2015 --> Cefepime 03/08/2015 -->   DEVINE, ALMA, MD  [facility] Pager [phone]  Time spent in minutes: 25 minutes  If 7PM-7AM, please contact night-coverage [email or web address] Password [facility] 03/09/2015, 7:35  AM   LOS: 1 day    HPI/Subjective: No acute overnight events. Patient lethargic this morning.   Objective: Filed Vitals:   03/08/15 1920 03/08/15 1945 03/09/15 0000 03/09/15 0400  BP:  130/51 163/75 118/58  Pulse:      Temp:  99.7 F (37.6 C) 99.8 F (37.7 C) 99.2 F (37.3 C)  TempSrc:  Axillary Axillary Axillary  Resp:  26 21 31   Height:      Weight:      SpO2: 97% 98% 92% 95%    Intake/Output Summary (Last 24 hours) at 03/09/15 0735 Last data filed at 03/09/15 0500  Gross per 24 hour  Intake 3762.5 ml  Output   1475 ml  Net 2287.5 ml    Exam:   General:  Pt is lethargic, does not respond to verbal stimuli, minimally response to painful stimuli   Cardiovascular: Regular rate and rhythm, S1/S2 (+)  Respiratory: Clear to auscultation bilaterally, no wheezing, no crackles, no rhonchi  Abdomen: Soft, non tender, non distended, bowel sounds present  Extremities: No edema, pulses DP and PT palpable bilaterally; ecchymosis on upper extremities   Neuro: Grossly nonfocal  Data Reviewed: Basic Metabolic Panel:  Recent Labs Lab 03/08/15 0934 03/08/15 1515 03/09/15 0334  NA 137  --  138  K 3.1*  --  3.0*  CL 103  --  106  CO2 25  --  23  GLUCOSE 106*  --  94  BUN 13  --  10  CREATININE 0.92  --  0.89  CALCIUM 9.0  --  8.4*  MG  --  1.7  --    Liver Function Tests:  Recent Labs Lab 03/08/15 0934 03/09/15 0334  AST 22 17  ALT 12* 10*  ALKPHOS 108 85  BILITOT 0.8 0.9  PROT 6.5 5.5*  ALBUMIN 3.4* 2.6*   No results for input(s): LIPASE, AMYLASE in the last 168 hours. No results for input(s): AMMONIA in the last 168 hours. CBC:  Recent Labs Lab 03/08/15 0934 03/09/15 0334  WBC 10.0 7.5  NEUTROABS 7.1  --   HGB 10.9* 9.2*  HCT 32.9* 27.7*  MCV 90.1 90.5  PLT 287 244   Cardiac Enzymes: No results for input(s): CKTOTAL, CKMB, CKMBINDEX, TROPONINI in the last 168 hours. BNP: Invalid input(s): POCBNP CBG: No results for input(s): GLUCAP in  the last 168 hours.  Recent Results (from the past 240 hour(s))  MRSA PCR Screening     Status: None   Collection Time: 03/08/15  3:30 PM  Result Value Ref Range Status   MRSA by PCR NEGATIVE NEGATIVE Final    Comment:        The GeneXpert MRSA Assay (FDA approved for NASAL specimens only), is one component of a comprehensive MRSA colonization surveillance program. It is not intended to diagnose MRSA infection nor to guide or monitor treatment for MRSA infections.      Scheduled Meds: . ceFEPime (MAXIPIME) IV  2 g Intravenous Q12H  . mometasone-formoterol  2 puff Inhalation BID  . sodium chloride  3 mL Intravenous Q12H  . vancomycin  750 mg Intravenous Q12H   Continuous Infusions: . 0.9 % NaCl with KCl 20 mEq / L 75 mL/hr (03/09/15 0718)

## 2015-03-09 NOTE — Progress Notes (Signed)
ANTICOAGULATION CONSULT NOTE - Initial Consult  Pharmacy Consult for Lovenox Indication: History of Afib  No Known Allergies  Patient Measurements: Height: 5\' 7"  (170.2 cm) Weight: 153 lb 14.1 oz (69.8 kg) IBW/kg (Calculated) : 66.1  Vital Signs: Temp: 99.2 F (37.3 C) (05/08 0400) Temp Source: Axillary (05/08 0400) BP: 118/58 mmHg (05/08 0400)  Labs:  Recent Labs  03/08/15 0934 03/09/15 0334  HGB 10.9* 9.2*  HCT 32.9* 27.7*  PLT 287 244  CREATININE 0.92 0.89    Estimated Creatinine Clearance: 53.6 mL/min (by C-G formula based on Cr of 0.89).   Medical History: Past Medical History  Diagnosis Date  . Asthma   . Hypertension   . A-fib   . Hyperlipidemia   . Shortness of breath   . CHF (congestive heart failure) 10/28/2011  . Forgetfulness   . PVC's (premature ventricular contractions)   . Mild aortic insufficiency   . Dementia     [name] 02/05/2015  . Failure to thrive in adult     /notes 02/05/2015  . COPD (chronic obstructive pulmonary disease)   . Hypothyroidism     [name] 02/05/2015  . Fall 02/04/2015    with altered mental status off of his baseline after he suffered a mechanical fall /notes 02/04/2015  . CAP (community acquired pneumonia)     [name] 02/05/2015  . Colon cancer     Medications:  Scheduled:  . ceFEPime (MAXIPIME) IV  2 g Intravenous Q12H  . enoxaparin (LOVENOX) injection  1 mg/kg Subcutaneous Q12H  . sodium chloride  3 mL Intravenous Q12H  . vancomycin  750 mg Intravenous Q12H   Infusions:  . 0.9 % NaCl with KCl 20 mEq / L 75 mL/hr (03/09/15 4098)    Assessment: 81yo male admitted with altered mental status from [place].  Patient was previously on Xarelto for history of afib, with last dose on 5/6 in the morning per [facility] nurse.  Pharmacy has been consulted to dose lovenox this hospital admission.  Hgb 9.2 low and trending down, plt wnl.  No reports of bleeding at this time.  SCDs on as well.    Scr 0.89, Crcl  ~50-55 mL/min.  No renal adjustment needed for lovenox at this time.    Plan:  - Lovenox 70 mg SQ Q12H - Monitor for signs and symptoms of bleeding - Monitor renal function  Hassie Bruce, Pharm. D. Clinical Pharmacy Resident Pager: [phone] Ph: [phone] 03/09/2015 10:12 AM

## 2015-03-09 NOTE — Progress Notes (Signed)
Notified MD of pt's HR dropping into the 40's when pt resting. Otherwise in the 60's - 70's.

## 2015-03-10 ENCOUNTER — Encounter: Payer: Self-pay | Admitting: Internal Medicine

## 2015-03-10 ENCOUNTER — Inpatient Hospital Stay (HOSPITAL_COMMUNITY): Payer: Medicare Other

## 2015-03-10 DIAGNOSIS — A419 Sepsis, unspecified organism: Principal | ICD-10-CM

## 2015-03-10 LAB — LEGIONELLA ANTIGEN, URINE

## 2015-03-10 MED ORDER — ENSURE ENLIVE PO LIQD
237.0000 mL | Freq: Two times a day (BID) | ORAL | Status: DC
  Administered 2015-03-11 (×2): 237 mL via ORAL

## 2015-03-10 MED ORDER — LOSARTAN POTASSIUM 50 MG PO TABS
50.0000 mg | ORAL_TABLET | Freq: Every day | ORAL | Status: DC
  Administered 2015-03-10 – 2015-03-11 (×2): 50 mg via ORAL
  Filled 2015-03-10 (×2): qty 1

## 2015-03-10 NOTE — Progress Notes (Addendum)
Initial Nutrition Assessment  DOCUMENTATION CODES:  Severe malnutrition in context of chronic illness  INTERVENTION:  Ensure Enlive (each supplement provides 350kcal and 20 grams of protein)  NUTRITION DIAGNOSIS:  Malnutrition related to dysphagia, chronic illness as evidenced by meal completion < 25%, severe depletion of muscle mass, severe depletion of body fat.   GOAL:  Patient will meet greater than or equal to 90% of their needs   MONITOR:  PO intake, Supplement acceptance, Labs, Weight trends, Skin, I & O's  REASON FOR ASSESSMENT:  Consult Assessment of nutrition requirement/status  ASSESSMENT: 81 year old male with past medical history significant for atrial fibrillation on anticoagulation with xarelto, remote history of colon cancer, COPD, hypothyroidism, hypertension who was recently treated for pneumonia and subsequently discharged to skilled nursing facility. Family reported on admission that patient was doing quite fine until his wife passed away about 6 months ago. Since then, patient's functional capacity has significantly declined to the point that he couldn't care for himself or ambulate without the assistance. He's by mouth intake has also decreased over past couple of days prior to this admission. Family brought the patient to [facility] ED for evaluation because of more lethargy and confusion. Of note, patient was given Rocephin in skilled nursing facility because of suspicion that he may have urinary tract infection.  Pt admitted with HCAP. Pt unable to provide much history due to cognitive impairment. His only complaint is that he is [facility] (he hears better on his left side).  Pt reports he has a good appetite and hasn't lost weight, however, this is inconsistent with chart review. Noted pt refused breakfast this morning. Lunch tray was just delivered prior to RD visit and pt had not yet attempted. He confirms difficulty swallowing- SLP evaluated and recommended a dysphagia  3 diet.  Suspect ongoing weight loss over the past year, however, unable to confirm via wt hx. Pt with severe fat and muscle depletion. Palliative care consult pending.  Labs reviewed. K: 3.0, Calcium: 8.4, BUN/Creat: 67/1.53.   Height:  Ht Readings from Last 1 Encounters:  03/08/15 5\' 7"  (1.702 m)    Weight:  Wt Readings from Last 1 Encounters:  03/08/15 153 lb 14.1 oz (69.8 kg)    Ideal Body Weight:  67.3 kg  Wt Readings from Last 10 Encounters:  03/08/15 153 lb 14.1 oz (69.8 kg)  02/08/15 157 lb 3 oz (71.3 kg)  01/13/15 155 lb (70.308 kg)  09/10/14 166 lb 8 oz (75.524 kg)  09/07/14 173 lb (78.472 kg)  09/07/14 173 lb (78.472 kg)  09/05/14 163 lb (73.936 kg)  12/06/13 157 lb (71.215 kg)  11/25/13 161 lb 13.1 oz (73.4 kg)  08/28/13 156 lb (70.761 kg)    BMI:  Body mass index is 24.1 kg/(m^2). Normal weight range  Estimated Nutritional Needs:  Kcal:  1700-1900  Protein:  70-80 grams  Fluid:  1.7-1.9 L  Skin:  Reviewed, no issues (open rt posterior toe abrasion)  Diet Order:  DIET DYS 3 Room service appropriate?: Yes; Fluid consistency:: Thin  EDUCATION NEEDS:  Education needs addressed   Intake/Output Summary (Last 24 hours) at 03/10/15 1341 Last data filed at 03/10/15 1035  Gross per 24 hour  Intake    400 ml  Output   2000 ml  Net  -1600 ml    Last BM:  PTA  [facility] A. Jimmye Norman, RD, LDN, CDE Pager: [phone] After hours Pager: [phone]

## 2015-03-10 NOTE — Evaluation (Signed)
Occupational Therapy Evaluation Patient Details Name: Todd [name] MRN: [id number] DOB: 26-Jul-1924 Today's Date: 03/10/2015    History of Present Illness Patient is an 81 y/o male with history of DM, A-fib on xarelto, asthma, [place], HTN, HLD, hypothyroid admtted with AMS; + pneumonia, sepsis, hypokalemia   Clinical Impression   Pt admitted with above. Unsure of PLOF, as family was not present in session. Recommending pt go to SNF at d/c. Feel pt will benefit from acute OT to increase independence and activity tolerance prior to d/c.     Follow Up Recommendations  SNF    Equipment Recommendations  Other (comment) (defer to next venue)    Recommendations for Other Services       Precautions / Restrictions Precautions Precautions: Fall Restrictions Weight Bearing Restrictions: No      Mobility Bed Mobility Overal bed mobility: Needs Assistance Bed Mobility: Supine to Sit;Sit to Supine     Supine to sit: Supervision Sit to supine: Supervision (assist to scoot HOB and cues for technique)   General bed mobility comments: cues for technique for bed mobility.  Transfers Overall transfer level: Needs assistance Equipment used: Rolling walker (2 wheeled) Transfers: Sit to/from Stand Sit to Stand: Max assist         General transfer comment: heavy assist from bed, but hips not in great position. Pt did not stand with trunk fully upright.    Balance  Pt leaning posteriorly when attempting to don sock sitting EOB. OT assisted to help bring trunk upright (mod assist)                                          ADL Overall ADL's : Needs assistance/impaired Eating/Feeding: Supervision/ safety;Sitting   Grooming: Wash/dry face;Set up;Supervision/safety;Sitting (sitting EOB)               Lower Body Dressing: Maximal assistance;Sit to/from stand   Toilet Transfer: Maximal assistance;RW (sit to stand from bed)           Functional mobility  during ADLs:  (did not ambulate) General ADL Comments: Pt sat EOB and became SOB with activity. Cues for deep breathing technique and to take a break.     Vision     Perception     Praxis      Pertinent Vitals/Pain Pain Assessment: No/denies pain     Hand Dominance     Extremity/Trunk Assessment Upper Extremity Assessment Upper Extremity Assessment: Generalized weakness   Lower Extremity Assessment Lower Extremity Assessment: Defer to PT evaluation       Communication Communication Communication: HOH   Cognition Arousal/Alertness: Awake/alert Behavior During Therapy: WFL for tasks assessed/performed Overall Cognitive Status: No family/caregiver present to determine baseline cognitive functioning                     General Comments       Exercises       Shoulder Instructions      Home Living Family/patient expects to be discharged to:: Skilled nursing facility                                 Additional Comments: no family present; recently discharged to SNF from hospital      Prior Functioning/Environment          Comments: PLOF unknown as  no family present.     OT Diagnosis: Generalized weakness;Altered mental status   OT Problem List: Decreased strength;Decreased activity tolerance;Impaired balance (sitting and/or standing);Decreased cognition;Decreased knowledge of use of DME or AE;Decreased knowledge of precautions;Cardiopulmonary status limiting activity   OT Treatment/Interventions: Self-care/ADL training;DME and/or AE instruction;Therapeutic activities;Patient/family education;Balance training;Cognitive remediation/compensation;Therapeutic exercise;Energy conservation    OT Goals(Current goals can be found in the care plan section) Acute Rehab OT Goals Patient Stated Goal: not stated OT Goal Formulation: Patient unable to participate in goal setting Time For Goal Achievement: 03/17/15 Potential to Achieve Goals: Fair ADL  Goals Pt Will Perform Upper Body Bathing: with set-up;sitting Pt Will Perform Lower Body Bathing: with mod assist;sitting/lateral leans Pt Will Transfer to Toilet: with mod assist;stand pivot transfer;bedside commode  OT Frequency: Min 2X/week   Barriers to D/C:            Co-evaluation              End of Session Equipment Utilized During Treatment: Gait belt;Rolling walker Nurse Communication: Mobility status;Other (comment) (d/c recommendation)  Activity Tolerance: Patient limited by fatigue Patient left: in bed;with call bell/phone within reach;with bed alarm set   Time: 3474-2595 OT Time Calculation (min): 16 min Charges:  OT General Charges $OT Visit: 1 Procedure OT Evaluation $Initial OT Evaluation Tier I: 1 Procedure G-CodesBenito Mccreedy OTR/L [phone] 03/10/2015, 11:51 AM

## 2015-03-10 NOTE — Consult Note (Signed)
Consultation Note Date: 03/10/2015   Patient Name: Todd [name]  DOB: 12/29/1923  MRN: [id number]  Age / Sex: 81 y.o., male   PCP: Susy Frizzle, MD Referring Physician: Robbie Lis, MD  Reason for Consultation: Establishing goals of care  Palliative Care Assessment and Plan Summary of Established Goals of Care and Medical Treatment Preferences    Palliative Care Discussion Held Today Contacts/Participants in Discussion: Primary Decision Maker: Todd [name], daughter  HCPOA: yes  No answer to initial call at 12:30. Did make contact at 1400 hrs but she was only able to talk for a few minutes and we did not get to the substantive issues. She will call back in a couple of hours. Able to talk with Todd [name] 1645: she confirms the DNR/DNI status as well as no HD. She reports that he is not demented but gets confused when sick compounded by his loss of hearing. She would like him to get strong enough to return to his home where she has been living with him since his wife died. We will try to meet in the AM to further discuss goals of care and disposition - most likely SNF for rehab.    Code Status/Advance Care Planning:  DNR/DNI, no HD  Symptom Management:   Pain free, asymptomatic  Palliative Prophylaxis: no laxative on order - will order  Psycho-social/Spiritual:   Support System: daughter as primary care giver.  Desire for further Chaplaincy support:no  Prognosis: Unable to determine - guarded 2/2 age and co-morbidities Discharge Planning:  [facility] for rehab with Palliative care service follow-up       Chief Complaint/History of Present Illness: 81 year old male with past medical history significant for atrial fibrillation on anticoagulation with xarelto, remote history of colon cancer, COPD, hypothyroidism, hypertension who was recently treated for pneumonia and subsequently discharged to skilled nursing facility. Family reported on admission that  patient was doing quite fine until his wife passed away about 6 months ago. Since then, patient's functional capacity has significantly declined to the point that he couldn't care for himself or ambulate without the assistance. He's by mouth intake has also decreased over past couple of days prior to this admission. Family brought the patient to [facility] ED for evaluation because of more lethargy and confusion. Of note, patient was given Rocephin in skilled nursing facility because of suspicion that he may have urinary tract infection.  On admission, patient was febrile with T max 101.1 F, oxygen saturation of 90% with nasal cannula oxygen support, tachypneic and tachycardic. Blood work was notable for hemoglobin of 10.9, potassium 3.1 which was supplemented, normal creatinine. His glucose was 106. Chest x-ray showed increased opacity in the right lung base compared to prior studies which could represent combination of atelectasis and pneumonia. CT head did not show acute intracranial abnormalities.  Primary Diagnoses  Present on Admission:   HCAP (healthcare-associated pneumonia)  Acute encephalopathy  Protein-calorie malnutrition, severe  Atrial fibrillation  Chronic diastolic CHF (congestive heart failure)  Palliative Review of Systems: Patient unable to give detailed report: hindered by [facility] and lack of insight. Denies pain, denies bowel problems. He denies and does not appear anxious. He was previously diagnosed with grief related depression and is on medication.  I have reviewed the medical record, interviewed the patient and family, and examined the patient. The following aspects are pertinent.  Past Medical History  Diagnosis Date   Asthma    Hypertension    A-fib    Hyperlipidemia  Shortness of breath    CHF (congestive heart failure) 10/28/2011   Forgetfulness    PVC's (premature ventricular contractions)    Mild aortic insufficiency    Dementia     /notes 02/05/2015     Failure to thrive in adult     /notes 02/05/2015   COPD (chronic obstructive pulmonary disease)    Hypothyroidism     /notes 02/05/2015   Fall 02/04/2015    with altered mental status off of his baseline after he suffered a mechanical fall /notes 02/04/2015   CAP (community acquired pneumonia)     [name] 02/05/2015   Colon cancer    History   Social History   Marital Status: Married    Spouse Name: N/A   Number of Children: 2: son died at 88 y/o; daughter   Years of Education: HSG   Social History Main Topics   Smoking status: Former Smoker   Smokeless tobacco: Not on file     Comment: quit 30+ yrs ago   Alcohol Use: No   Drug Use: No   Sexual Activity: No   Other Topics Concern   None   Social History Narrative  Elderly white man. He worked for [employer] until retirement at age 79. Married for more than 50 years, widowed in 2015. He has a living daughter with whom he was living. He has 2 grandchildren, 2 great-grandchildren. Has poor insight to his condition 2/2 dementia.   Family History  Problem Relation Age of Onset   Hyperlipidemia Father    Hypertension Father    Heart disease Father    Asthma Father    Stroke Father    Heart failure Father    Heart disease Mother    Heart failure Mother    Heart attack Sister    Scheduled Meds:  ceFEPime (MAXIPIME) IV  2 g Intravenous Q12H   enoxaparin (LOVENOX) injection  1 mg/kg Subcutaneous Q12H   losartan  50 mg Oral Daily   sodium chloride  3 mL Intravenous Q12H   vancomycin  750 mg Intravenous Q12H   Continuous Infusions:  0.9 % NaCl with KCl 20 mEq / L 700 mL (03/09/15 1446)   PRN Meds:.acetaminophen **OR** acetaminophen, ipratropium, levalbuterol, ondansetron **OR** ondansetron (ZOFRAN) IV Medications Prior to Admission:  Prior to Admission medications   Medication Sig Start Date End Date Taking? Authorizing Provider  ADVAIR DISKUS 500-50 MCG/DOSE AEPB INHALE 1 PUFF TWICE A DAY 12/23/14   Yes Susy Frizzle, MD  ferrous sulfate 325 (65 FE) MG tablet TAKE 1 TABLET BY MOUTH EVERY DAY 12/23/14  Yes Susy Frizzle, MD  fluticasone [facility]) 50 MCG/ACT nasal spray Place 2 sprays into both nostrils daily.   Yes Historical Provider, MD  levothyroxine (SYNTHROID, LEVOTHROID) 25 MCG tablet Take 25 mcg by mouth daily.   Yes Historical Provider, MD  losartan (COZAAR) 50 MG tablet Take 50 mg by mouth daily. 01/23/15  Yes Historical Provider, MD  omeprazole (PRILOSEC) 40 MG capsule Take 40 mg by mouth daily.   Yes Historical Provider, MD  pravastatin (PRAVACHOL) 20 MG tablet Take 20 mg by mouth daily.   Yes Historical Provider, MD  rivaroxaban (XARELTO) 20 MG TABS tablet Take 20 mg by mouth every morning.    Yes Historical Provider, MD  venlafaxine XR (EFFEXOR XR) 37.5 MG 24 hr capsule 2 pills in AM Patient taking differently: Take 75 mg by mouth daily with breakfast. 2 pills in AM 01/13/15  Yes Susy Frizzle, MD  feeding  supplement, ENSURE ENLIVE, (ENSURE ENLIVE) LIQD Take 237 mLs by mouth 2 (two) times daily between meals. Patient not taking: Reported on 03/08/2015 02/07/15   [name] Poisson, MD  levofloxacin (LEVAQUIN) 750 MG tablet Take 1 tablet (750 mg total) by mouth daily. Patient not taking: Reported on 03/08/2015 02/08/15   [name] Poisson, MD   No Known Allergies CBC:    Component Value Date/Time   WBC 7.5 03/09/2015 0334   HGB 9.2* 03/09/2015 0334   HCT 27.7* 03/09/2015 0334   PLT 244 03/09/2015 0334   MCV 90.5 03/09/2015 0334   NEUTROABS 7.1 03/08/2015 0934   LYMPHSABS 1.5 03/08/2015 0934   MONOABS 1.2* 03/08/2015 0934   EOSABS 0.2 03/08/2015 0934   BASOSABS 0.0 03/08/2015 0934   Comprehensive Metabolic Panel:    Component Value Date/Time   NA 138 03/09/2015 0334   K 3.0* 03/09/2015 0334   CL 106 03/09/2015 0334   CO2 23 03/09/2015 0334   BUN 10 03/09/2015 0334   CREATININE 0.89 03/09/2015 0334   CREATININE 1.12 01/13/2015 1507   GLUCOSE 94 03/09/2015 0334   CALCIUM 8.4*  03/09/2015 0334   AST 17 03/09/2015 0334   ALT 10* 03/09/2015 0334   ALKPHOS 85 03/09/2015 0334   BILITOT 0.9 03/09/2015 0334   PROT 5.5* 03/09/2015 0334   ALBUMIN 2.6* 03/09/2015 0334   Imaging:  PCXR 03/08/15 IMPRESSION: 1. Increased facets right lung base when compared the prior study. This may be due to atelectasis, pneumonia or a combination. 2. Improved opacity previously noted in the right upper lobe. 3. Persistent mild thickening of the interstitial markings without overt pulmonary edema. 4. Mild persistent cardiomegaly.  SLP Evaluation 03/09/15: Delayed swallow and silent aspiration. Rec: Dysphagia 3 diet and thin liquids.    Physical Exam: Vital Signs: BP 162/62 mmHg   Pulse 72   Temp(Src) 97.3 F (36.3 C) (Oral)   Resp 18   Ht $R'5\' 7"'dT$  (1.702 m)   Wt 69.8 kg (153 lb 14.1 oz)   BMI 24.10 kg/m2   SpO2 99% SpO2: SpO2: 99 % O2 Device: O2 Device: Not Delivered O2 Flow Rate:   Intake/output summary:  Intake/Output Summary (Last 24 hours) at 03/10/15 1202 Last data filed at 03/10/15 1035  Gross per 24 hour  Intake    400 ml  Output   2375 ml  Net  -1975 ml   LBM:   Baseline Weight: Weight: 69.4 kg (153 lb) Most recent weight: Weight: 69.8 kg (153 lb 14.1 oz)  Exam Findings:  Gen'l - elderly weak appearing white man with mild respiratory distress/increased WOB HEENT - mild temporal wasting. C&S clear, PERRLA Neck - no JVD, no carotid bruits Cor - 2+ radial pulse, quiet precordium, IRIR rate controlled. II/VI systolic mm best at RSB Pulm - mild increased WOB. Crackles at right base that cleared with deep inspiration Abd - soft, BS+, no guarding or rebound Ext - interosseous wasting hands, subluxecd DIP left and right thumbs Neuro - HOH, no formal testing. Lacks insight to condition.          Palliative Performance Scale: 40%              Additional Data Reviewed: Recent Labs     03/08/15  0934  03/09/15  0334  WBC  10.0  7.5  HGB  10.9*  9.2*  PLT  287  244  NA   137  138  BUN  13  10  CREATININE  0.92  0.89   BMP  Latest Ref Rng 03/09/2015 03/08/2015 02/07/2015  Glucose 70 - 99 mg/dL 94 106(H) -  BUN 6 - 20 mg/dL 10 13 -  Creatinine 0.61 - 1.24 mg/dL 0.89 0.92 -  Sodium 135 - 145 mmol/L 138 137 -  Potassium 3.5 - 5.1 mmol/L 3.0(L) 3.1(L) 3.5  Chloride 101 - 111 mmol/L 106 103 -  CO2 22 - 32 mmol/L 23 25 -  Calcium 8.9 - 10.3 mg/dL 8.4(L) 9.0 -    Cardiac Panel (last 3 results) No results for input(s): CKTOTAL, CKMB, TROPONINI, RELINDX in the last 72 hours.  Met with patient and interviewed him about ACP. Lacks insight to his condition. Discussed with daughter - DNR/DNI   Time Total: 60 minutes  Greater than 50%  of this time was spent counseling and coordinating care related to the above assessment and plan.  Signed by: Neena Rhymes  [No Provider Link Found]  03/10/2015, 12:02 PM  Please contact Palliative Medicine Team phone at [phone] for questions and concerns.

## 2015-03-10 NOTE — Progress Notes (Signed)
MBSS complete. Full report located under chart review in imaging section. Manroop Jakubowicz, MA CCC-SLP [phone]  

## 2015-03-10 NOTE — Progress Notes (Signed)
Patient ID: Todd [name], male   DOB: 07-09-1924, 81 y.o.   MRN: [id number] [facility] PROGRESS NOTE  Todd [name]:[id number] DOB: [birth year] DOA: 03/08/2015 PCP: Odette Fraction, MD  Brief narrative:    81 year old male with past medical history significant for atrial fibrillation on anticoagulation with xarelto, remote history of colon cancer, COPD, hypothyroidism, hypertension who was recently treated for pneumonia and subsequently discharged to skilled nursing facility. Family reported on admission that patient was doing quite fine until his wife passed away about 6 months ago. Since then, patient's functional capacity has significantly declined to the point that he couldn't care for himself or ambulate without the assistance. He's by mouth intake has also decreased over past couple of days prior to this admission. Family brought the patient to [facility] ED for evaluation because of more lethargy and confusion. Of note, patient was given Rocephin in skilled nursing facility because of suspicion that he may have urinary tract infection.  On admission, patient was febrile with T max 101.1 F, oxygen saturation of 90% with nasal cannula oxygen support, tachypneic and tachycardic. Blood work was notable for hemoglobin of 10.9, potassium 3.1 which was supplemented, normal creatinine. His glucose was 106. Chest x-ray showed increased opacity in the right lung base compared to prior studies which could represent combination of atelectasis and pneumonia. CT head did not show acute intracranial abnormalities. Patient was admitted to stepdown unit for further evaluation and treatment of healthcare associated pneumonia. He was transferred to telemetry floor 03/09/2015.  Barrier to discharge: Awaiting palliative care consultation and recommendations. Patient is currently on IV antibiotics for healthcare associated pneumonia. Most likely he will return to skilled nursing facility in about 3 days if he  continues to improve.  Assessment/Plan:    Principal Problem: Acute respiratory failure with hypoxia  / acute COPD exacerbation / healthcare associated pneumonia - Hypoxia secondary to combination of healthcare associated pneumonia and COPD exacerbation. - Patient had evidence of pneumonia on chest x-ray. His admission chest x-ray showed increased opacity in the right lung base which could represent pneumonia. Patient was started on empiric cefepime and vancomycin while awaiting blood work results.  -  strep pneumonia is negative, blood cultures so far show no growth to date. Legionella is pending as of 03/10/2015.  - Palliative care consult is pending. Appreciate their recommendations.  - We will discontinue telemetry today. He   Active Problems: Sepsis secondary to pneumonia / right lower lobe pneumonia - Sepsis criteria met on the admission with fever, hypoxia, tachypnea. Source of infection is pneumonia as seen on chest x-ray. Lactic acid was within normal limits. White blood cell count was within normal limits. - patient was started on vancomycin and cefepime from the time of the admission. His blood cultures are negative so far. Follow up on final report.  Atrial fibrillation, chronic - CHADS vasc score at least 4 - On anticoagulation with xarelto. Since he was lethargic on the admission we changed to Lovenox per pharmacy. He is currently on dysphagia 3 diet and if he continues to do well and we'll switch him back to xarelto in next 1-2 days.  Iron deficiency anemia / anemia of chronic disease - Anemia secondary to iron deficiency anemia as well as anemia of chronic disease. Patient is on anticoagulation. This could contribute to anemia. - Hemoglobin remains stable. - Follow-up CBC tomorrow morning. - No bleeding.  Hypokalemia - Likely due to poor by mouth intake.  - Potassiums was supplemented in IV fluids.  -  Follow-up BMP tomorrow morning.   Essential hypertension -  Antihypertensives in initially on hold because of hypotension. Current blood pressure is 162/62 so we will resume home medications.  Acute encephalopathy / generalized weakness / failure to thrive - Mental status better today. He is more awake and alert. - per SLP evaluation - dysphagia 3 diet recommended. Order placed. - PT evaluation completed and recommendation is for skilled nursing facility placement. Patient is already in skilled nursing facility and most likely he will go back there once he is medically stable.  Severe protein calorie malnutrition - In the context of chronic illness.  - Dysphagia 3 diet recommended by SLP. - Nutrition consulted.   DVT Prophylaxis  - Lovenox subcutaneous ordered instead of xarelto until we make sure oral intake stable.   Code Status: DNR/DNI Family Communication:  Family not at the bedside this morning. Disposition Plan: Palliative care consulted for goals of care. Patient will likely return to skilled nursing facility in the next couple of days if he feels better. At this time he is not stable for discharge. He is receiving IV antibiotics for healthcare associated pneumonia.  IV access:  Peripheral IV  Procedures and diagnostic studies:    Ct Head Wo Contrast 03/08/2015  1. No acute intracranial abnormalities. 2. No change from the prior exam.  Atrophy and old infarcts.     Dg Chest Portable 1 View 03/08/2015  1. Increased facets right lung base when compared the prior study. This may be due to atelectasis, pneumonia or a combination. 2. Improved opacity previously noted in the right upper lobe. 3. Persistent mild thickening of the interstitial markings without overt pulmonary edema. 4. Mild persistent cardiomegaly.     Medical Consultants:  Palliative care   Other Consultants:  PT/ OT - skilled nursing facility placement SLP - dysphagia 3 diet Nutrition  IAnti-Infectives:   Vancomycin 03/08/2015 --> Cefepime 03/08/2015 -->   Maimouna Rondeau,  Retina Bernardy, MD  [facility] Pager [phone]  Time spent in minutes: 25 minutes  If 7PM-7AM, please contact night-coverage [email or web address] Password [email or web address] 03/10/2015, 10:02 AM   LOS: 2 days    HPI/Subjective: No acute overnight events. Patient reports no shortness of breath, no cough.  Objective: Filed Vitals:   03/09/15 1100 03/09/15 1311 03/09/15 2200 03/10/15 0603  BP: 147/71 157/57 162/71 162/62  Pulse: 58   72  Temp:  98.2 F (36.8 C) 98.5 F (36.9 C) 97.3 F (36.3 C)  TempSrc:  Oral Oral Oral  Resp: $Remo'22 16 18 18  'OkExC$ Height:      Weight:      SpO2: 98% 97% 97% 99%    Intake/Output Summary (Last 24 hours) at 03/10/15 1002 Last data filed at 03/10/15 0328  Gross per 24 hour  Intake    550 ml  Output   2350 ml  Net  -1800 ml    Exam:   General:  Pt is awake, more alert  Cardiovascular: Rate controlled, appreciate S1-S2  Respiratory: Bilateral air entry, no wheezing  Abdomen: Nontender, nondistended abdomen, appreciate bowel sounds  Extremities: ecchymosis on upper extremities, no lower extremity edema  Neuro: No focal deficits  Data Reviewed: Basic Metabolic Panel:  Recent Labs Lab 03/08/15 0934 03/08/15 1515 03/09/15 0334  NA 137  --  138  K 3.1*  --  3.0*  CL 103  --  106  CO2 25  --  23  GLUCOSE 106*  --  94  BUN 13  --  10  CREATININE 0.92  --  0.89  CALCIUM 9.0  --  8.4*  MG  --  1.7  --    Liver Function Tests:  Recent Labs Lab 03/08/15 0934 03/09/15 0334  AST 22 17  ALT 12* 10*  ALKPHOS 108 85  BILITOT 0.8 0.9  PROT 6.5 5.5*  ALBUMIN 3.4* 2.6*   No results for input(s): LIPASE, AMYLASE in the last 168 hours. No results for input(s): AMMONIA in the last 168 hours. CBC:  Recent Labs Lab 03/08/15 0934 03/09/15 0334  WBC 10.0 7.5  NEUTROABS 7.1  --   HGB 10.9* 9.2*  HCT 32.9* 27.7*  MCV 90.1 90.5  PLT 287 244   Cardiac Enzymes: No results for input(s): CKTOTAL, CKMB, CKMBINDEX, TROPONINI in the last 168  hours. BNP: Invalid input(s): POCBNP CBG: No results for input(s): GLUCAP in the last 168 hours.  Recent Results (from the past 240 hour(s))  Culture, blood (routine x 2)     Status: None (Preliminary result)   Collection Time: 03/08/15  9:37 AM  Result Value Ref Range Status   Specimen Description BLOOD RIGHT WRIST  Final   Special Requests BOTTLES DRAWN AEROBIC AND ANAEROBIC 5CC  Final   Culture   Final           BLOOD CULTURE RECEIVED NO GROWTH TO DATE     Report Status PENDING  Incomplete  Culture, blood (routine x 2)     Status: None (Preliminary result)   Collection Time: 03/08/15  9:45 AM  Result Value Ref Range Status   Specimen Description BLOOD RIGHT ARM  Final   Special Requests BOTTLES DRAWN AEROBIC AND ANAEROBIC 10ML  Final   Culture   Final           BLOOD CULTURE RECEIVED NO GROWTH TO DATE     Report Status PENDING  Incomplete  Urine culture     Status: None   Collection Time: 03/08/15 10:10 AM  Result Value Ref Range Status   Specimen Description URINE, CATHETERIZED  Final   Special Requests NONE  Final   Culture NO GROWTH Performed at [facility]   Final   Report Status 03/09/2015 FINAL  Final  MRSA PCR Screening     Status: None   Collection Time: 03/08/15  3:30 PM  Result Value Ref Range Status   MRSA by PCR NEGATIVE NEGATIVE Final     Scheduled Meds: . ceFEPime (MAXIPIME) IV  2 g Intravenous Q12H  . enoxaparin (LOVENOX) injection  1 mg/kg Subcutaneous Q12H  . sodium chloride  3 mL Intravenous Q12H  . vancomycin  750 mg Intravenous Q12H   Continuous Infusions: . 0.9 % NaCl with KCl 20 mEq / L 700 mL (03/09/15 1446)

## 2015-03-11 LAB — CBC
HEMATOCRIT: 30.8 % — AB (ref 39.0–52.0)
Hemoglobin: 10.1 g/dL — ABNORMAL LOW (ref 13.0–17.0)
MCH: 29.2 pg (ref 26.0–34.0)
MCHC: 32.8 g/dL (ref 30.0–36.0)
MCV: 89 fL (ref 78.0–100.0)
PLATELETS: 258 10*3/uL (ref 150–400)
RBC: 3.46 MIL/uL — ABNORMAL LOW (ref 4.22–5.81)
RDW: 14.3 % (ref 11.5–15.5)
WBC: 5.6 10*3/uL (ref 4.0–10.5)

## 2015-03-11 LAB — BASIC METABOLIC PANEL
ANION GAP: 11 (ref 5–15)
BUN: 8 mg/dL (ref 6–20)
CALCIUM: 8.8 mg/dL — AB (ref 8.9–10.3)
CHLORIDE: 104 mmol/L (ref 101–111)
CO2: 23 mmol/L (ref 22–32)
CREATININE: 0.8 mg/dL (ref 0.61–1.24)
GFR calc non Af Amer: >60 mL/min (ref >60)
Glucose, Bld: 101 mg/dL — ABNORMAL HIGH (ref 70–99)
Potassium: 3 mmol/L — ABNORMAL LOW (ref 3.5–5.1)
Sodium: 138 mmol/L (ref 135–145)

## 2015-03-11 MED ORDER — TAMSULOSIN HCL 0.4 MG PO CAPS
0.4000 mg | ORAL_CAPSULE | Freq: Every day | ORAL | Status: DC
  Administered 2015-03-11: 0.4 mg via ORAL
  Filled 2015-03-11: qty 1

## 2015-03-11 MED ORDER — VENLAFAXINE HCL ER 37.5 MG PO CP24: 75.0000 mg | ORAL_CAPSULE | Freq: Every day | ORAL | Status: DC

## 2015-03-11 MED ORDER — LEVOFLOXACIN 750 MG PO TABS: 750.0000 mg | ORAL_TABLET | Freq: Every day | ORAL | Status: DC

## 2015-03-11 MED ORDER — LEVOFLOXACIN 750 MG PO TABS
750.0000 mg | ORAL_TABLET | Freq: Every day | ORAL | Status: DC
  Administered 2015-03-11: 750 mg via ORAL
  Filled 2015-03-11: qty 1

## 2015-03-11 MED ORDER — IPRATROPIUM BROMIDE 0.02 % IN SOLN: 0.5000 mg | RESPIRATORY_TRACT | Status: DC | PRN

## 2015-03-11 MED ORDER — RIVAROXABAN 20 MG PO TABS
20.0000 mg | ORAL_TABLET | Freq: Every day | ORAL | Status: DC
  Filled 2015-03-11: qty 1

## 2015-03-11 MED ORDER — TAMSULOSIN HCL 0.4 MG PO CAPS: 0.4000 mg | ORAL_CAPSULE | Freq: Every day | ORAL | Status: AC

## 2015-03-11 MED ORDER — POTASSIUM CHLORIDE CRYS ER 20 MEQ PO TBCR
40.0000 meq | EXTENDED_RELEASE_TABLET | Freq: Once | ORAL | Status: AC
  Administered 2015-03-11: 40 meq via ORAL
  Filled 2015-03-11: qty 2

## 2015-03-11 MED ORDER — ENSURE ENLIVE PO LIQD: 237.0000 mL | Freq: Two times a day (BID) | ORAL | Status: DC

## 2015-03-11 MED ORDER — RIVAROXABAN 20 MG PO TABS: 20.0000 mg | ORAL_TABLET | Freq: Every day | ORAL | Status: AC

## 2015-03-11 MED ORDER — LEVALBUTEROL HCL 0.63 MG/3ML IN NEBU: 0.6300 mg | INHALATION_SOLUTION | RESPIRATORY_TRACT | Status: AC | PRN

## 2015-03-11 NOTE — Discharge Summary (Signed)
Physician Discharge Summary  Todd [name] BVQ:[id number] DOB: December 29, 1923 DOA: 03/08/2015  PCP: Odette Fraction, MD  Admit date: 03/08/2015 Discharge date: 03/11/2015  Time spent: 35 minutes  Recommendations for Outpatient Follow-up:  1. Bmp 1 week 2. DYS diet 3. levaquin until 5/14 4. Palliative services to follow at SNF 5. Flomax start 5/10- will need voiding trial in a few days  Discharge Diagnoses:  Principal Problem:   HCAP (healthcare-associated pneumonia) Active Problems:   Atrial fibrillation   Chronic diastolic CHF (congestive heart failure)   Protein-calorie malnutrition, severe   Acute encephalopathy   Discharge Condition: improved- feels back to baseline  Diet recommendation: DYS 3 diet  Filed Weights   03/08/15 1001 03/08/15 1514  Weight: 69.4 kg (153 lb) 69.8 kg (153 lb 14.1 oz)    History of present illness:  Todd [name] is a 80 y.o. male with a past medical history of atrial fibrillation, COPD, remote history of colon cancer, who was recently hospitalized about a month ago and was treated for pneumonia. He was discharged to skilled nursing facility. His daughter is at bedside. She tells me that he was actually doing quite okay. Ever since his wife died about 6 months ago patient's overall functional capacity has been diminishing. He has shown decline. He has found it difficult to ambulate on his on. So now he gets around in a wheelchair. But otherwise he was eating well and talking and was otherwise in reasonable health. And then about 2-3 days ago they noticed that he was getting more confused. He was talking out of his head. His oral intake diminished. And then yesterday he was given ceftriaxone at the nursing facility as they suspected he may have had a UTI. This morning he was not easily arousable and so he was sent over to the hospital for evaluation. Patient is unable to provide any history due to his encephalopathy.  Hospital Course:  Acute respiratory  failure with hypoxia / acute COPD exacerbation / healthcare associated pneumonia - Hypoxia secondary to combination of healthcare associated pneumonia and COPD exacerbation. - Patient had evidence of pneumonia on chest x-ray. His admission chest x-ray showed increased opacity in the right lung base which could represent pneumonia. Patient was started on empiric cefepime and vancomycin while awaiting blood work results.  - strep pneumonia is negative, blood cultures so far show no growth to date. Legionella is negative - Palliative care consult    Sepsis secondary to pneumonia / right lower lobe pneumonia - Sepsis criteria met on the admission with fever, hypoxia, tachypnea. Source of infection is pneumonia as seen on chest x-ray. Lactic acid was within normal limits. White blood cell count was within normal limits. - levaquin until 5/14  Atrial fibrillation, chronic - CHADS vasc score at least 4 - On anticoagulation with xarelto.  Iron deficiency anemia / anemia of chronic disease - Anemia secondary to iron deficiency anemia as well as anemia of chronic disease. Patient is on anticoagulation. This could contribute to anemia. - Hemoglobin remains stable. - No bleeding.  Hypokalemia - Likely due to poor by mouth intake.  - Potassiums was supplemented in IV fluids.  - Follow-up BMP tomorrow morning.   Essential hypertension - Antihypertensives in initially on hold because of hypotension. Current blood pressure is 162/62 so we will resume home medications.  Acute encephalopathy / generalized weakness / failure to thrive - Mental status better today. He is more awake and alert. - per SLP evaluation - dysphagia 3 diet recommended.  Order placed. - PT evaluation completed and recommendation is for skilled nursing facility placement. Patient is already in skilled nursing facility and most likely he will go back there once he is medically stable.  Severe protein calorie malnutrition -  In the context of chronic illness.  - Dysphagia 3 diet recommended by SLP. - Nutrition consulted.  Procedures:    Consultations:  pallaitive  Discharge Exam: Filed Vitals:   03/11/15 0458  BP: 159/67  Pulse:   Temp: 97.7 F (36.5 C)  Resp: 20    General: hard of hearing- feels like he back to baseline Cardiovascular: rrr Respiratory: clear  Discharge Instructions    Current Discharge Medication List    CONTINUE these medications which have NOT CHANGED   Details  ADVAIR DISKUS 500-50 MCG/DOSE AEPB INHALE 1 PUFF TWICE A DAY Qty: 180 each, Refills: 1    ferrous sulfate 325 (65 FE) MG tablet TAKE 1 TABLET BY MOUTH EVERY DAY Qty: 30 tablet, Refills: 11    fluticasone (FLONASE) 50 MCG/ACT nasal spray Place 2 sprays into both nostrils daily.    levothyroxine (SYNTHROID, LEVOTHROID) 25 MCG tablet Take 25 mcg by mouth daily.    losartan (COZAAR) 50 MG tablet Take 50 mg by mouth daily. Refills: 11    omeprazole (PRILOSEC) 40 MG capsule Take 40 mg by mouth daily.    pravastatin (PRAVACHOL) 20 MG tablet Take 20 mg by mouth daily.    rivaroxaban (XARELTO) 20 MG TABS tablet Take 20 mg by mouth every morning.     venlafaxine XR (EFFEXOR XR) 37.5 MG 24 hr capsule 2 pills in AM Qty: 60 capsule, Refills: 3   Associated Diagnoses: Depression    feeding supplement, ENSURE ENLIVE, (ENSURE ENLIVE) LIQD Take 237 mLs by mouth 2 (two) times daily between meals. Qty: 237 mL, Refills: 12    levofloxacin (LEVAQUIN) 750 MG tablet Take 1 tablet (750 mg total) by mouth daily. Qty: 4 tablet, Refills: 0       No Known Allergies    The results of significant diagnostics from this hospitalization (including imaging, microbiology, ancillary and laboratory) are listed below for reference.    Significant Diagnostic Studies: Ct Head Wo Contrast  03/08/2015   CLINICAL DATA:  Pt not able to communicate unable to straighten headThis morning he was not easily arousable and so he was  sent over to the hospital for evaluation. Patient is unable to provide any history due to his encephalopathy.  EXAM: CT HEAD WITHOUT CONTRAST  TECHNIQUE: Contiguous axial images were obtained from the base of the skull through the vertex without intravenous contrast.  COMPARISON:  02/04/2015  FINDINGS: Ventricles normal configuration. There is ventricular and sulcal enlargement reflecting moderate atrophy. No hydrocephalus.  Smaller area of encephalomalacia in the left occipital lobe. There is hypoattenuation and small areas of encephalomalacia in the left posterior frontal lobe adjacent parietal lobe. These findings are stable reflecting old infarcts.  No parenchymal masses or mass effect. There is no evidence of a recent transcortical infarct.  There are no extra-axial masses or abnormal fluid collections.  No intracranial hemorrhage.  Visualized sinuses and mastoid air cells are clear.  IMPRESSION: 1. No acute intracranial abnormalities. 2. No change from the prior exam.  Atrophy and old infarcts.   Electronically Signed   By: Lajean Manes M.D.   On: 03/08/2015 12:41   Dg Chest Portable 1 View  03/08/2015   CLINICAL DATA:  Code sepsis.Per ED note: Arrives from [facility] via EMS; sent for altered  mental status starting yesterday. Usually alert and talks; resting with eyes closed and groans to stimulation. [facility] suspected UTI yesterday and gave Rocephin yesterdayH/o COPD, CHF, HTN, a-fib, asthma, dementia.  EXAM: PORTABLE CHEST - 1 VIEW  COMPARISON:  02/07/2015  FINDINGS: There is increased opacity at the right lung base when compared the prior study with decreased opacity in the right upper lobe. There persistent thickened interstitial markings bilaterally. Cardiac silhouette remains mildly enlarged. No pneumothorax.  IMPRESSION: 1. Increased facets right lung base when compared the prior study. This may be due to atelectasis, pneumonia or a combination. 2. Improved opacity previously noted in the right  upper lobe. 3. Persistent mild thickening of the interstitial markings without overt pulmonary edema. 4. Mild persistent cardiomegaly.   Electronically Signed   By: Lajean Manes M.D.   On: 03/08/2015 09:53   Dg Swallowing Func-speech Pathology  03/10/2015    Objective Swallowing Evaluation:    Patient Details  Name: TRENTAN TRIPPE MRN: [id number] Date of Birth: Sep 23, 1924  Today's Date: 03/10/2015 Time: SLP Start Time (ACUTE ONLY): 0845-SLP Stop Time (ACUTE ONLY): 0910 SLP Time Calculation (min) (ACUTE ONLY): 25 min  Past Medical History:  Past Medical History  Diagnosis Date  . Asthma   . Hypertension   . A-fib   . Hyperlipidemia   . Shortness of breath   . CHF (congestive heart failure) 10/28/2011  . Forgetfulness   . PVC's (premature ventricular contractions)   . Mild aortic insufficiency   . Dementia     [name] 02/05/2015  . Failure to thrive in adult     /notes 02/05/2015  . COPD (chronic obstructive pulmonary disease)   . Hypothyroidism     [name] 02/05/2015  . Fall 02/04/2015    with altered mental status off of his baseline after he suffered a  mechanical fall /notes 02/04/2015  . CAP (community acquired pneumonia)     [name] 02/05/2015  . Colon cancer    Past Surgical History:  Past Surgical History  Procedure Laterality Date  . Colon surgery      Partial hemecolectomy   . Central line insertion  09/08/2011        HPI:  Other Pertinent Information: This is a 81 year old Caucasian male who was  recently hospitalized and treated for community-acquired pneumonia. He was  discharged to a skilled nursing facility. Comes in with altered mental  status, low-grade fever and perhaps a cough. Chest X-ray reveals possible  infiltrate in the right lower lung. Other possibilities for his altered  mental status include hypercapnia. Meningitis appears to be unlikely based  on examination and other data. Dehydration could also be contributing..   Patient has had previous swallow work up in 2012 with rx for nectar  liquids due to silent  aspiration of thins.  The patient reports that he  just stopped using thickened liquids.  No Data Recorded  Assessment / Plan / Recommendation CHL IP CLINICAL IMPRESSIONS 03/10/2015  Therapy Diagnosis (None)  Clinical Impression Pt presents with mild motor and sensory deficits  impacting safety with liquid textures. Pts oral phase is mildly impaired,  but functional. Primary deficits include delayed swallow with silent  penetration of thin and nectar thick liquids before and during the  swallow, impacted largely by bolus size and rate of intake. When taking  small single sips with head in neutral pt able to tolerate thin and nectar  sips without signficant aspiration. A cued throat clear and second swallow  would decrease risk of  penetration and residue. Recommend pt consume thin  liquids and dys 3 (mechanical soft) diet with supervision for safe intake.  Again texture of liquids is not as important as bolus size/aspiration  precautions for this pt; supervision needed.       CHL IP TREATMENT RECOMMENDATION 03/10/2015  Treatment Recommendations Therapy as outlined in treatment plan below     CHL IP DIET RECOMMENDATION 03/10/2015  SLP Diet Recommendations Dysphagia 3 (Mech soft);Thin  Liquid Administration via (None)  Medication Administration Whole meds with puree  Compensations Clear throat intermittently;Slow rate;Small  sips/bites;Multiple dry swallows after each bite/sip  Postural Changes and/or Swallow Maneuvers (None)     CHL IP OTHER RECOMMENDATIONS 03/10/2015  Recommended Consults (None)  Oral Care Recommendations Oral care BID  Other Recommendations (None)     No flowsheet data found.   CHL IP FREQUENCY AND DURATION 03/10/2015  Speech Therapy Frequency (ACUTE ONLY) (None)  Treatment Duration 2 weeks     Pertinent Vitals/Pain NA    SLP Swallow Goals CHL IP SWALLOW STUDY GOALS 09/11/2011  Patient will consume recommended diet without observed clinical signs of  aspiration with Minimal assistance  Swallow Study Goal #1 -  Progress (None)  Patient will utilize recommended strategies during swallow to increase  swallowing safety with Minimal assistance  Swallow Study Goal #2 - Progress (None)  Goal #3 (None)  Swallow Study Goal #3 - Progress (None)  Goal #4 (None)  Swallow Study Goal #4 - Progress (None)    No flowsheet data found.    CHL IP REASON FOR REFERRAL 03/10/2015  Reason for Referral Objectively evaluate swallowing function     CHL IP ORAL PHASE 03/10/2015  Lips (None)  Tongue (None)  Mucous membranes (None)  Nutritional status (None)  Other (None)  Oxygen therapy (None)  Oral Phase Impaired  Oral - Pudding Teaspoon (None)  Oral - Pudding Cup (None)  Oral - Honey Teaspoon (None)  Oral - Honey Cup (None)  Oral - Honey Syringe (None)  Oral - Nectar Teaspoon (None)  Oral - Nectar Cup (None)  Oral - Nectar Straw (None)  Oral - Nectar Syringe (None)  Oral - Ice Chips (None)  Oral - Thin Teaspoon (None)  Oral - Thin Cup (None)  Oral - Thin Straw (None)  Oral - Thin Syringe (None)  Oral - Puree (None)  Oral - Mechanical Soft (None)  Oral - Regular (None)  Oral - Multi-consistency (None)  Oral - Pill (None)  Oral Phase - Comment (None)      CHL IP PHARYNGEAL PHASE 03/10/2015  Pharyngeal Phase Impaired  Pharyngeal - Pudding Teaspoon (None)  Penetration/Aspiration details (pudding teaspoon) (None)  Pharyngeal - Pudding Cup (None)  Penetration/Aspiration details (pudding cup) (None)  Pharyngeal - Honey Teaspoon (None)  Penetration/Aspiration details (honey teaspoon) (None)  Pharyngeal - Honey Cup (None)  Penetration/Aspiration details (honey cup) (None)  Pharyngeal - Honey Syringe (None)  Penetration/Aspiration details (honey syringe) (None)  Pharyngeal - Nectar Teaspoon (None)  Penetration/Aspiration details (nectar teaspoon) (None)  Pharyngeal - Nectar Cup (None)  Penetration/Aspiration details (nectar cup) (None)  Pharyngeal - Nectar Straw (None)  Penetration/Aspiration details (nectar straw) (None)  Pharyngeal - Nectar Syringe (None)   Penetration/Aspiration details (nectar syringe) (None)  Pharyngeal - Ice Chips (None)  Penetration/Aspiration details (ice chips) (None)  Pharyngeal - Thin Teaspoon (None)  Penetration/Aspiration details (thin teaspoon) (None)  Pharyngeal - Thin Cup (None)  Penetration/Aspiration details (thin cup) (None)  Pharyngeal - Thin Straw (None)  Penetration/Aspiration details (thin straw) (None)  Pharyngeal -  Thin Syringe (None)  Penetration/Aspiration details (thin syringe') (None)  Pharyngeal - Puree (None)  Penetration/Aspiration details (puree) (None)  Pharyngeal - Mechanical Soft (None)  Penetration/Aspiration details (mechanical soft) (None)  Pharyngeal - Regular (None)  Penetration/Aspiration details (regular) (None)  Pharyngeal - Multi-consistency (None)  Penetration/Aspiration details (multi-consistency) (None)  Pharyngeal - Pill (None)  Penetration/Aspiration details (pill) (None)  Pharyngeal Comment (None)      CHL IP CERVICAL ESOPHAGEAL PHASE 09/13/2011  Cervical Esophageal Phase (None)  Pudding Teaspoon (None)  Pudding Cup (None)  Honey Teaspoon (None)  Honey Cup (None)  Honey Straw (None)  Nectar Teaspoon (None)  Nectar Cup (None)  Nectar Straw (None)  Nectar Sippy Cup (None)  Thin Teaspoon (None)  Thin Cup (None)  Thin Straw (None)  Thin Sippy Cup (None)  Cervical Esophageal Comment Severe residue on UES post-swallow of  puree/solids    No flowsheet data found.  Herbie [place], [place] CCC-SLP [phone]        Lynann Beaver 03/10/2015, 10:39 AM     Microbiology: Recent Results (from the past 240 hour(s))  Culture, blood (routine x 2)     Status: None (Preliminary result)   Collection Time: 03/08/15  9:37 AM  Result Value Ref Range Status   Specimen Description BLOOD RIGHT WRIST  Final   Special Requests BOTTLES DRAWN AEROBIC AND ANAEROBIC 5CC  Final   Culture   Final           BLOOD CULTURE RECEIVED NO GROWTH TO DATE CULTURE WILL BE HELD FOR 5 DAYS BEFORE ISSUING A FINAL NEGATIVE  REPORT Performed at [facility]    Report Status PENDING  Incomplete  Culture, blood (routine x 2)     Status: None (Preliminary result)   Collection Time: 03/08/15  9:45 AM  Result Value Ref Range Status   Specimen Description BLOOD RIGHT ARM  Final   Special Requests BOTTLES DRAWN AEROBIC AND ANAEROBIC 10ML  Final   Culture   Final           BLOOD CULTURE RECEIVED NO GROWTH TO DATE CULTURE WILL BE HELD FOR 5 DAYS BEFORE ISSUING A FINAL NEGATIVE REPORT Performed at [facility]    Report Status PENDING  Incomplete  Urine culture     Status: None   Collection Time: 03/08/15 10:10 AM  Result Value Ref Range Status   Specimen Description URINE, CATHETERIZED  Final   Special Requests NONE  Final   Colony Count NO GROWTH Performed at [facility]   Final   Culture NO GROWTH Performed at [facility]   Final   Report Status 03/09/2015 FINAL  Final  MRSA PCR Screening     Status: None   Collection Time: 03/08/15  3:30 PM  Result Value Ref Range Status   MRSA by PCR NEGATIVE NEGATIVE Final    Comment:        The GeneXpert MRSA Assay (FDA approved for NASAL specimens only), is one component of a comprehensive MRSA colonization surveillance program. It is not intended to diagnose MRSA infection nor to guide or monitor treatment for MRSA infections.      Labs: Basic Metabolic Panel:  Recent Labs Lab 03/08/15 0934 03/08/15 1515 03/09/15 0334 03/11/15 0505  NA 137  --  138 138  K 3.1*  --  3.0* 3.0*  CL 103  --  106 104  CO2 25  --  23 23  GLUCOSE 106*  --  94 101*  BUN 13  --  10 8  CREATININE 0.92  --  0.89 0.80  CALCIUM 9.0  --  8.4* 8.8*  MG  --  1.7  --   --    Liver Function Tests:  Recent Labs Lab 03/08/15 0934 03/09/15 0334  AST 22 17  ALT 12* 10*  ALKPHOS 108 85  BILITOT 0.8 0.9  PROT 6.5 5.5*  ALBUMIN 3.4* 2.6*   No results for input(s): LIPASE, AMYLASE in the last 168 hours. No results for input(s): AMMONIA in  the last 168 hours. CBC:  Recent Labs Lab 03/08/15 0934 03/09/15 0334 03/11/15 0505  WBC 10.0 7.5 5.6  NEUTROABS 7.1  --   --   HGB 10.9* 9.2* 10.1*  HCT 32.9* 27.7* 30.8*  MCV 90.1 90.5 89.0  PLT 287 244 258   Cardiac Enzymes: No results for input(s): CKTOTAL, CKMB, CKMBINDEX, TROPONINI in the last 168 hours. BNP: BNP (last 3 results)  Recent Labs  02/05/15 0105  BNP 144.1*    ProBNP (last 3 results)  Recent Labs  09/06/14 0033  PROBNP 1947.0*    CBG: No results for input(s): GLUCAP in the last 168 hours.     SignedEulogio Bear  [facility] 03/11/2015, 12:49 PM

## 2015-03-11 NOTE — Progress Notes (Signed)
Speech Language Pathology Treatment: Dysphagia  Patient Details Name: Todd [name] MRN: [id number] DOB: January 22, 1924 Today's Date: 03/11/2015 Time: 1859-0931 SLP Time Calculation (min) (ACUTE ONLY): 15 min  Assessment / Plan / Recommendation Clinical Impression  Provided skilled observation of PO intake, reinforcing strategies with moderate verbal cues, pt able to return demonstrate. Daughter present to receive education. Pt will need f/u at SNF to teach precautions and provide staff education.    HPI Other Pertinent Information: This is a 81 year old Caucasian male who was recently hospitalized and treated for community-acquired pneumonia. He was discharged to a skilled nursing facility. Comes in with altered mental status, low-grade fever and perhaps a cough. Chest X-ray reveals possible infiltrate in the right lower lung. Other possibilities for his altered mental status include hypercapnia. Meningitis appears to be unlikely based on examination and other data. Dehydration could also be contributing..  Patient has had previous swallow work up in 2012 with rx for nectar liquids due to silent aspiration of thins.  The patient reports that he just stopped using thickened liquids.   Pertinent Vitals Pain Assessment: No/denies pain Faces Pain Scale: No hurt  SLP Plan  Continue with current plan of care    Recommendations Diet recommendations: Thin liquid;Dysphagia 3 (mechanical soft) Liquids provided via: Cup;No straw Medication Administration: Whole meds with puree Supervision: Patient able to self feed;Staff to assist with self feeding Compensations: Clear throat intermittently;Slow rate;Small sips/bites;Multiple dry swallows after each bite/sip Postural Changes and/or Swallow Maneuvers: Seated upright 90 degrees              Oral Care Recommendations: Oral care BID Follow up Recommendations: Skilled Nursing facility Plan: Continue with current plan of care    GO    [facility],  [place] CCC-SLP [phone]  Todd [name] 03/11/2015, 2:27 PM

## 2015-03-11 NOTE — Progress Notes (Signed)
ANTIBIOTIC CONSULT NOTE - FOLLOW UP  Pharmacy Consult for Levaquin/Xarelto Indication: HCAP/Afib  No Known Allergies  Patient Measurements: Height: 5\' 7"  (170.2 cm) Weight: 153 lb 14.1 oz (69.8 kg) IBW/kg (Calculated) : 66.1   Vital Signs: Temp: 97.7 F (36.5 C) (05/10 0458) Temp Source: Oral (05/10 0458) BP: 159/67 mmHg (05/10 0458) Intake/Output from previous day: 05/09 0701 - 05/10 0700 In: 200 [P.O.:200] Out: 1800 [Urine:1800] Intake/Output from this shift: Total I/O In: 100 [P.O.:100] Out: -   Labs:  Recent Labs  03/09/15 0334 03/11/15 0505  WBC 7.5 5.6  HGB 9.2* 10.1*  PLT 244 258  CREATININE 0.89 0.80   Estimated Creatinine Clearance: 59.7 mL/min (by C-G formula based on Cr of 0.8). No results for input(s): VANCOTROUGH, VANCOPEAK, VANCORANDOM, GENTTROUGH, GENTPEAK, GENTRANDOM, TOBRATROUGH, TOBRAPEAK, TOBRARND, AMIKACINPEAK, AMIKACINTROU, AMIKACIN in the last 72 hours.    Assessment:  Anticoagulation:   89 YOM on xarelto PTA for afib, which has been on hold and bridged with Lovenox. Now to switch back to xarelto. Hgb 10.1, plt 258K  Infectious Disease:  Pt is also on Vanc/cefepime for Sepsis 2/2 to PNA - WBC 10 >> 5.6, afeb, LA 1.22, will transition IV abx to po levaquin. Creat. 0.8, CrCl 55 - 60 mL/min  5/7 Cefepime >> 5/10 5/7 Vanc >> 5/10 5/10 Levaquin >> 5/7 Bld x 2 >> ngtd 5/7 Urine - neg  Plan:  - D/c Lovenox - Xarelto 20 mg po daily with dinner  - Levaquin 750 po Q 24 hrs - Pharmacy sign off, will monitor labs peripherally and adjust dose if needed.  Thanks!  Maryanna Shape, PharmD, BCPS  Clinical Pharmacist  Pager: [phone]   03/11/2015,12:17 PM

## 2015-03-11 NOTE — Clinical Social Work Note (Signed)
Clinical Social Worker facilitated patient discharge including contacting patient family and facility to confirm patient discharge plans.  Clinical information faxed to facility and family agreeable with plan.  CSW arranged ambulance transport via [facility] to [facility].  RN to call report prior to discharge.  Clinical Social Worker will sign off for now as social work intervention is no longer needed. Please consult us again if new need arises.  Jesse Moira Umholtz, LCSW [phone] 

## 2015-03-11 NOTE — Care Management Note (Signed)
Case Management Note  Patient Details  Name: EMONI YANG MRN: [id number] Date of Birth: [birth year]  Subjective/Objective:               Patient from [facility], plan is to return back to SNF.  CSW aware.    Action/Plan:   Expected Discharge Date:  03/11/15               Expected Discharge Plan:  [place]  In-House Referral:  Clinical Social Work  Discharge planning Services  CM Consult  Post Acute Care Choice:    Choice offered to:     DME Arranged:    DME Agency:     HH Arranged:    [place] Agency:     Status of Service:     Medicare Important Message Given:  Yes Date Medicare IM Given:  03/11/15 Medicare IM give by:  Tomi Bamberger RN Date Additional Medicare IM Given:    Additional Medicare Important Message give by:     If discussed at [place] of Stay Meetings, dates discussed:    Additional Comments:  Zenon Mayo, RN 03/11/2015, 11:25 AM

## 2015-03-11 NOTE — Progress Notes (Signed)
Medicare Important Message given?  YES (If response is "NO", the following Medicare IM given date fields will be blank) Date Medicare IM given:  03/11/15 Medicare IM given by:  Tajha Sammarco 

## 2015-03-11 NOTE — Plan of Care (Signed)
Problem: Phase I Progression Outcomes Goal: Voiding-avoid urinary catheter unless indicated Outcome: Not Met (add Reason) MD Eliseo Squires states to leave in at this time

## 2015-03-11 NOTE — Clinical Social Work Note (Signed)
Clinical Social Work Assessment  Patient Details  Name: Todd [name] MRN: [id number] Date of Birth: June 05, 1924  Date of referral:  03/11/15               Reason for consult:  Facility Placement, Discharge Planning                Permission sought to share information with:  Chartered certified accountant granted to share information::  Yes, Verbal Permission Granted  Agency::  [facility]   Housing/Transportation Living arrangements for the past 2 months:  [place] of Information:  Adult Children (Pt daughter [name] [phone]) Patient Interpreter Needed:  None Criminal Activity/Legal Involvement Pertinent to Current Situation/Hospitalization:  No - Comment as needed Significant Relationships:  Adult Children Lives with:  Facility Resident Do you feel safe going back to the place where you live?  Yes Need for family participation in patient care:  Yes (Comment)   Care giving concerns:  Pt admitted from SNF   Social Worker assessment / plan:  CSW spoke with pt daughter over the phone to notify of plan for dc back to facility. Pt daughter confirmed pt at [facility] [place] and plan will hopefully be for pt to return at home after receiving further rehab. She expressed no complaints with discharge back to facility and agreeable to non-emergency ambulance transport for pt.   Insurance information:  Managed Medicare PT Recommendations:  [place] / Referral to community resources:   (None needed)  Patient/Family's Response to care:  Pt daughter pleasant and agreeable for plan to return to facility  Patient/Family's Understanding of and Emotional Response to Diagnosis, Current Treatment, and Prognosis:  Pt daughter informed CSW she has been updated by nurses and has a good understanding of pt condition. She had an appropriate emotional response.  Emotional Assessment Appearance:   Well-Groomed Attitude/Demeanor/Rapport:  Unable to Assess Affect (typically observed):  Unable to Assess Orientation:  Oriented to Self Alcohol / Substance use:  Not Applicable Psych involvement (Current and /or in the community):  No (Comment)  Discharge Needs  Concerns to be addressed:  Discharge Planning Concerns Readmission within the last 30 days:    Current discharge risk:  Dependent with Mobility Barriers to Discharge:  No Barriers Identified    [place], [place]

## 2015-03-11 NOTE — Progress Notes (Signed)
Physical Therapy Treatment Patient Details Name: Todd [name] MRN: [id number] DOB: Jan 21, 1924 Today's Date: 03/11/2015    History of Present Illness Patient is an 81 y/o male with history of DM, A-fib on xarelto, asthma, [place], HTN, HLD, hypothyroid admtted with AMS; + pneumonia, sepsis, hypokalemia    PT Comments    Patient progressing with ability to ambulate in room and progressive improvements in visual/spatial versus body/spatial awareness with upright posture due to initial posterior lean.  Will benefit from SNF rehab at d/c.  Follow Up Recommendations  SNF;Supervision/Assistance - 24 hour     Equipment Recommendations  None recommended by PT    Recommendations for Other Services       Precautions / Restrictions Precautions Precautions: Fall Precaution Comments: HOH    Mobility  Bed Mobility Overal bed mobility: Needs Assistance Bed Mobility: Supine to Sit;Rolling Rolling: Mod assist   Supine to sit: Mod assist;+2 for physical assistance     General bed mobility comments: assist to roll to get off bed pan, the to sit from supine assist due to stiffness in trunk and LE's to bring legs off bed and scoot to edge and lift trunk upright  Transfers Overall transfer level: Needs assistance Equipment used: Rolling walker (2 wheeled) Transfers: Sit to/from Stand Sit to Stand: Mod assist;+2 physical assistance         General transfer comment: lifting assist from bed and to stabilize due to posterior bias  Ambulation/Gait Ambulation/Gait assistance: Mod assist Ambulation Distance (Feet): 13 Feet Assistive device: Rolling walker (2 wheeled) Gait Pattern/deviations: Step-through pattern;Trunk flexed;Decreased stride length;Shuffle     General Gait Details: posterior bias increasing with posterior support, stopped twice to reorient to upright and cues for anterior tranlation of COG over BOS.   Stairs            Wheelchair Mobility    Modified Rankin  (Stroke Patients Only)       Balance Overall balance assessment: Needs assistance       Postural control: Posterior lean   Standing balance-Leahy Scale: Poor Standing balance comment: standing static min/mod assist with UE support on walker.                    Cognition Arousal/Alertness: Awake/alert Behavior During Therapy: WFL for tasks assessed/performed Overall Cognitive Status: History of cognitive impairments - at baseline                      Exercises      General Comments General comments (skin integrity, edema, etc.): pt with BM was on bedpan prior to our arrival; assist to clean; daughter not in room during session, but arrived once pt settled in chair so gave report of how pt participated      Pertinent Vitals/Pain Pain Assessment: No/denies pain    Home Living                      Prior Function            PT Goals (current goals can now be found in the care plan section) Progress towards PT goals: Progressing toward goals    Frequency  Min 2X/week    PT Plan Current plan remains appropriate    Co-evaluation             End of Session Equipment Utilized During Treatment: Gait belt Activity Tolerance: Patient tolerated treatment well Patient left: in chair;with call bell/phone within reach;with chair alarm set;with  family/visitor present     Time: 6728-9791 PT Time Calculation (min) (ACUTE ONLY): 34 min  Charges:  $Gait Training: 8-22 mins $Therapeutic Activity: 8-22 mins                    G Codes:      Donye Campanelli,CYNDI 12-Mar-2015, 12:58 PM  Magda Kiel, [place] Mar 12, 2015

## 2015-03-13 ENCOUNTER — Non-Acute Institutional Stay (SKILLED_NURSING_FACILITY): Payer: Medicare Other | Admitting: Internal Medicine

## 2015-03-13 ENCOUNTER — Encounter: Payer: Self-pay | Admitting: Internal Medicine

## 2015-03-13 DIAGNOSIS — I110 Hypertensive heart disease with heart failure: Secondary | ICD-10-CM | POA: Diagnosis not present

## 2015-03-13 DIAGNOSIS — J189 Pneumonia, unspecified organism: Secondary | ICD-10-CM | POA: Diagnosis not present

## 2015-03-13 DIAGNOSIS — A419 Sepsis, unspecified organism: Secondary | ICD-10-CM | POA: Diagnosis not present

## 2015-03-13 DIAGNOSIS — I509 Heart failure, unspecified: Secondary | ICD-10-CM | POA: Diagnosis not present

## 2015-03-13 DIAGNOSIS — I482 Chronic atrial fibrillation, unspecified: Secondary | ICD-10-CM

## 2015-03-13 DIAGNOSIS — R627 Adult failure to thrive: Secondary | ICD-10-CM

## 2015-03-13 DIAGNOSIS — D509 Iron deficiency anemia, unspecified: Secondary | ICD-10-CM | POA: Diagnosis not present

## 2015-03-13 DIAGNOSIS — J441 Chronic obstructive pulmonary disease with (acute) exacerbation: Secondary | ICD-10-CM | POA: Diagnosis not present

## 2015-03-13 DIAGNOSIS — J9601 Acute respiratory failure with hypoxia: Secondary | ICD-10-CM

## 2015-03-13 DIAGNOSIS — E43 Unspecified severe protein-calorie malnutrition: Secondary | ICD-10-CM | POA: Diagnosis not present

## 2015-03-13 NOTE — Assessment & Plan Note (Signed)
In the context of chronic illness.  - Dysphagia 3 diet recommended by SLP. - Nutrition consulted.

## 2015-03-13 NOTE — Assessment & Plan Note (Signed)
CHADS vasc score at least 4 - On anticoagulation with xarelto

## 2015-03-13 NOTE — Assessment & Plan Note (Signed)
Hypoxia secondary to combination of healthcare associated pneumonia and COPD exacerbation. - Patient had evidence of pneumonia on chest x-ray. His admission chest x-ray showed increased opacity in the right lung base which could represent pneumonia. Patient was started on empiric cefepime and vancomycin while awaiting blood work results.  - strep pneumonia is negative, blood cultures so far show no growth to date. Legionella is negative

## 2015-03-13 NOTE — Progress Notes (Signed)
MRN: [id number] Name: Todd [name]  Sex: male Age: 81 y.o. DOB: August 27, 1924  [place] #: [facility] Facility/Room:307 Level Of Care: SNF Provider: Inocencio Homes D Emergency Contacts: Extended Emergency Contact Information Primary Emergency Contact: Agapito Games [place] of [place] Mobile Phone: [phone] Relation: Son Secondary Emergency Contact: Seymour Bars [place] of [place] Phone: [phone] Relation: Daughter  Code Status:   Allergies: Review of patient's allergies indicates no known allergies.  Chief Complaint  Patient presents with  . New Admit To SNF    HPI: Patient is 81 y.o. male who was hospitalized for acute resp failure 2/2 PNA and acute COPD exacerbation, now admitted to SNF for generalized weakness for OT/PT.  Past Medical History  Diagnosis Date  . Asthma   . Hypertension   . A-fib   . Hyperlipidemia   . Shortness of breath   . CHF (congestive heart failure) 10/28/2011  . Forgetfulness   . PVC's (premature ventricular contractions)   . Mild aortic insufficiency   . Dementia     [name] 02/05/2015  . Failure to thrive in adult     /notes 02/05/2015  . COPD (chronic obstructive pulmonary disease)   . Hypothyroidism     [name] 02/05/2015  . Fall 02/04/2015    with altered mental status off of his baseline after he suffered a mechanical fall /notes 02/04/2015  . CAP (community acquired pneumonia)     [name] 02/05/2015  . Colon cancer     Past Surgical History  Procedure Laterality Date  . Colon surgery      Partial hemecolectomy   . Central line insertion  09/08/2011           Medication List       This list is accurate as of: 03/13/15 11:59 PM.  Always use your most recent med list.               ADVAIR DISKUS 500-50 MCG/DOSE Aepb  Generic drug:  Fluticasone-Salmeterol  INHALE 1 PUFF TWICE A DAY     feeding supplement (ENSURE ENLIVE) Liqd  Take 237 mLs by mouth 2 (two) times daily between meals.     ferrous sulfate  325 (65 FE) MG tablet  TAKE 1 TABLET BY MOUTH EVERY DAY     fluticasone 50 MCG/ACT nasal spray  Commonly known as:  FLONASE  Place 2 sprays into both nostrils daily.     ipratropium 0.02 % nebulizer solution  Commonly known as:  ATROVENT  Take 2.5 mLs (0.5 mg total) by nebulization every 4 (four) hours as needed for wheezing or shortness of breath.     levalbuterol 0.63 MG/3ML nebulizer solution  Commonly known as:  XOPENEX  Take 3 mLs (0.63 mg total) by nebulization every 4 (four) hours as needed for wheezing or shortness of breath.     levofloxacin 750 MG tablet  Commonly known as:  LEVAQUIN  Take 1 tablet (750 mg total) by mouth daily.     levothyroxine 25 MCG tablet  Commonly known as:  SYNTHROID, LEVOTHROID  Take 25 mcg by mouth daily.     losartan 50 MG tablet  Commonly known as:  COZAAR  Take 50 mg by mouth daily.     omeprazole 40 MG capsule  Commonly known as:  PRILOSEC  Take 40 mg by mouth daily.     pravastatin 20 MG tablet  Commonly known as:  PRAVACHOL  Take 20 mg by mouth daily.     rivaroxaban 20 MG Tabs tablet  Commonly known as:  XARELTO  Take 1 tablet (20 mg total) by mouth daily with supper.     tamsulosin 0.4 MG Caps capsule  Commonly known as:  FLOMAX  Take 1 capsule (0.4 mg total) by mouth daily.     venlafaxine XR 37.5 MG 24 hr capsule  Commonly known as:  EFFEXOR XR  Take 2 capsules (75 mg total) by mouth daily with breakfast. 2 pills in AM        No orders of the defined types were placed in this encounter.    Immunization History  Administered Date(s) Administered  . Influenza Whole 07/02/2009, 09/10/2011  . Influenza,inj,Quad PF,36+ Mos 08/28/2013, 09/05/2014  . Pneumococcal Conjugate-13 09/05/2014  . Pneumococcal Polysaccharide-23 11/01/2006  . Zoster 08/26/2006    History  Substance Use Topics  . 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 Smokeless tobacco: Not on file     Comment: quit 30+ yrs ago  . Alcohol Use: No    Family  history is noncontributory    Review of Systems  DATA OBTAINED: from patient, nurse GENERAL:  no fevers  SKIN: No itching, rash or wounds EYES: No eye pain, redness, discharge EARS: No earache, tinnitus, change in hearing NOSE: No congestion, drainage or bleeding  MOUTH/THROAT: No mouth or tooth pain, No sore throat RESPIRATORY: some cough, no wheezing, no SOB CARDIAC: No chest pain, palpitations, lower extremity edema  GI: No abdominal pain, No N/V/D or constipation, No heartburn or reflux  GU: No dysuria, frequency or urgency, or incontinence  MUSCULOSKELETAL: No unrelieved bone/joint pain NEUROLOGIC: No headache, dizziness or focal weakness PSYCHIATRIC: No overt anxiety or sadness, No behavior issue.   Filed Vitals:   03/13/15 1317  BP: 135/58  Pulse: 95  Temp: 97.9 F (36.6 C)  Resp: 18    Physical Exam  GENERAL APPEARANCE: Alert, conversant,  No acute distress.  SKIN: No diaphoresis rash HEAD: Normocephalic, atraumatic  EYES: Conjunctiva/lids clear. Pupils round, reactive. EOMs intact.  EARS: External exam WNL, canals clear. Hearing grossly normal.  NOSE: No deformity or discharge.  MOUTH/THROAT: Lips w/o lesions  RESPIRATORY: Breathing is even, unlabored. Lung sounds are clear   CARDIOVASCULAR: Heart RRR no murmurs, rubs or gallops. No peripheral edema.   GASTROINTESTINAL: Abdomen is soft, non-tender, not distended w/ normal bowel sounds. GENITOURINARY: Bladder non tender, not distended  MUSCULOSKELETAL: No abnormal joints or musculature NEUROLOGIC:  Cranial nerves 2-12 grossly intact PSYCHIATRIC: Mood and affect appropriate to situation, no behavioral issues  Patient Active Problem List   Diagnosis Date Noted  . COPD exacerbation 03/13/2015  . Anemia, iron deficiency 03/13/2015  . HCAP (healthcare-associated pneumonia) 03/08/2015  . Acute encephalopathy 03/08/2015  . Protein-calorie malnutrition, severe 02/06/2015  . Weakness 02/05/2015  . CAP (community  acquired pneumonia) 02/05/2015  . Failure to thrive in adult 02/05/2015  . Fall 02/05/2015  . Pancreatitis, gallstone   . Cholecystitis, acute   . Elevated LFTs   . Preoperative cardiovascular examination   . Acute pancreatitis   . Pancreatitis 09/05/2014  . Hypothyroidism 09/05/2014  . Gallstone pancreatitis 11/23/2013  . Chronic diastolic CHF (congestive heart failure) 10/28/2011  . Pneumonia 09/06/2011  . Sepsis 09/06/2011  . Chest pain 09/06/2011  . Atrial fibrillation 09/06/2011  . Acute respiratory failure with hypoxia 09/06/2011  . PNEUMONIA, ORGANISM UNSPECIFIED 03/16/2010  . EDEMA 06/17/2008  . HLD (hyperlipidemia) 01/24/2008  . Hypertensive heart disease with CHF 01/24/2008  . ALLERGIC RHINITIS 01/24/2008  . Asthma 01/24/2008  . BRONCHIECTASIS 01/24/2008  . Nonspecific (  abnormal) findings on radiological and other examination of body structure 01/24/2008  . COLON CANCER 01/24/2008  . CHEST XRAY, ABNORMAL 01/24/2008    CBC    Component Value Date/Time   WBC 5.6 03/11/2015 0505   RBC 3.46* 03/11/2015 0505   RBC 3.42* 09/14/2011 0955   HGB 10.1* 03/11/2015 0505   HCT 30.8* 03/11/2015 0505   PLT 258 03/11/2015 0505   MCV 89.0 03/11/2015 0505   LYMPHSABS 1.5 03/08/2015 0934   MONOABS 1.2* 03/08/2015 0934   EOSABS 0.2 03/08/2015 0934   BASOSABS 0.0 03/08/2015 0934    CMP     Component Value Date/Time   NA 138 03/11/2015 0505   K 3.0* 03/11/2015 0505   CL 104 03/11/2015 0505   CO2 23 03/11/2015 0505   GLUCOSE 101* 03/11/2015 0505   BUN 8 03/11/2015 0505   CREATININE 0.80 03/11/2015 0505   CREATININE 1.12 01/13/2015 1507   CALCIUM 8.8* 03/11/2015 0505   PROT 5.5* 03/09/2015 0334   ALBUMIN 2.6* 03/09/2015 0334   AST 17 03/09/2015 0334   ALT 10* 03/09/2015 0334   ALKPHOS 85 03/09/2015 0334   BILITOT 0.9 03/09/2015 0334   GFRNONAA >60 03/11/2015 0505   GFRNONAA 58* 01/13/2015 1507   GFRAA >60 03/11/2015 0505   GFRAA 67 01/13/2015 1507    Assessment  and Plan  Acute respiratory failure with hypoxia Hypoxia secondary to combination of healthcare associated pneumonia and COPD exacerbation. - Patient had evidence of pneumonia on chest x-ray. His admission chest x-ray showed increased opacity in the right lung base which could represent pneumonia. Patient was started on empiric cefepime and vancomycin while awaiting blood work results.  - strep pneumonia is negative, blood cultures so far show no growth to date. Legionella is negative   HCAP (healthcare-associated pneumonia) Hypoxia secondary to combination of healthcare associated pneumonia and COPD exacerbation. - Patient had evidence of pneumonia on chest x-ray. His admission chest x-ray showed increased opacity in the right lung base which could represent pneumonia. Patient was started on empiric cefepime and vancomycin while awaiting blood work results.  - strep pneumonia is negative, blood cultures so far show no growth to date. Legionella is negative   Sepsis Sepsis criteria met on the admission with fever, hypoxia, tachypnea. Source of infection is pneumonia as seen on chest x-ray. Lactic acid was within normal limits. White blood cell count was within normal limits. - levaquin until 5/14   Atrial fibrillation CHADS vasc score at least 4 - On anticoagulation with xarelto   Hypertensive heart disease with CHF - Antihypertensives in initially on hold because of hypotension. Current blood pressure is 162/62 so we will resume home medications.    Anemia, iron deficiency Anemia secondary to iron deficiency anemia as well as anemia of chronic disease. Patient is on anticoagulation. This could contribute to anemia. - Hemoglobin remains stable. - No bleeding.   Failure to thrive in adult - Mental status better today. He is more awake and alert. - per SLP evaluation - dysphagia 3 diet recommended. Order placed. - PT evaluation completed and recommendation is for skilled  nursing facility placement. Patient is already in skilled nursing facility and most likely he will go back there once he is medically stable.    Protein-calorie malnutrition, severe In the context of chronic illness.  - Dysphagia 3 diet recommended by SLP. - Nutrition consulted.     Hennie Duos, MD

## 2015-03-13 NOTE — Assessment & Plan Note (Signed)
Anemia secondary to iron deficiency anemia as well as anemia of chronic disease. Patient is on anticoagulation. This could contribute to anemia. - Hemoglobin remains stable. - No bleeding.

## 2015-03-13 NOTE — Assessment & Plan Note (Signed)
Sepsis criteria met on the admission with fever, hypoxia, tachypnea. Source of infection is pneumonia as seen on chest x-ray. Lactic acid was within normal limits. White blood cell count was within normal limits. - levaquin until 5/14

## 2015-03-13 NOTE — Assessment & Plan Note (Signed)
-   Antihypertensives in initially on hold because of hypotension. Current blood pressure is 162/62 so we will resume home medications.

## 2015-03-13 NOTE — Assessment & Plan Note (Signed)
-   Mental status better today. He is more awake and alert. - per SLP evaluation - dysphagia 3 diet recommended. Order placed. - PT evaluation completed and recommendation is for skilled nursing facility placement. Patient is already in skilled nursing facility and most likely he will go back there once he is medically stable.

## 2015-03-14 LAB — CULTURE, BLOOD (ROUTINE X 2)
Culture: NO GROWTH
Culture: NO GROWTH

## 2015-03-16 ENCOUNTER — Encounter: Payer: Self-pay | Admitting: Internal Medicine

## 2015-03-25 ENCOUNTER — Emergency Department (HOSPITAL_COMMUNITY): Payer: Medicare Other

## 2015-03-25 ENCOUNTER — Emergency Department (HOSPITAL_COMMUNITY)
Admission: EM | Admit: 2015-03-25 | Discharge: 2015-03-25 | Disposition: A | Discharge status: Home / Self Care | Payer: Medicare Other | Attending: Emergency Medicine | Admitting: Emergency Medicine

## 2015-03-25 ENCOUNTER — Encounter (HOSPITAL_COMMUNITY): Payer: Self-pay | Admitting: Emergency Medicine

## 2015-03-25 DIAGNOSIS — Z7901 Long term (current) use of anticoagulants: Secondary | ICD-10-CM | POA: Insufficient documentation

## 2015-03-25 DIAGNOSIS — Z792 Long term (current) use of antibiotics: Secondary | ICD-10-CM | POA: Diagnosis not present

## 2015-03-25 DIAGNOSIS — E785 Hyperlipidemia, unspecified: Secondary | ICD-10-CM | POA: Insufficient documentation

## 2015-03-25 DIAGNOSIS — Z87891 Personal history of nicotine dependence: Secondary | ICD-10-CM | POA: Insufficient documentation

## 2015-03-25 DIAGNOSIS — Z79899 Other long term (current) drug therapy: Secondary | ICD-10-CM | POA: Diagnosis not present

## 2015-03-25 DIAGNOSIS — Z7951 Long term (current) use of inhaled steroids: Secondary | ICD-10-CM | POA: Diagnosis not present

## 2015-03-25 DIAGNOSIS — I509 Heart failure, unspecified: Secondary | ICD-10-CM | POA: Diagnosis not present

## 2015-03-25 DIAGNOSIS — R091 Pleurisy: Secondary | ICD-10-CM | POA: Diagnosis present

## 2015-03-25 DIAGNOSIS — Z9181 History of falling: Secondary | ICD-10-CM | POA: Diagnosis not present

## 2015-03-25 DIAGNOSIS — R410 Disorientation, unspecified: Secondary | ICD-10-CM | POA: Diagnosis not present

## 2015-03-25 DIAGNOSIS — J449 Chronic obstructive pulmonary disease, unspecified: Secondary | ICD-10-CM | POA: Diagnosis not present

## 2015-03-25 DIAGNOSIS — Z85038 Personal history of other malignant neoplasm of large intestine: Secondary | ICD-10-CM | POA: Diagnosis not present

## 2015-03-25 LAB — URINALYSIS, ROUTINE W REFLEX MICROSCOPIC
Bilirubin Urine: NEGATIVE
GLUCOSE, UA: NEGATIVE mg/dL
HGB URINE DIPSTICK: NEGATIVE
Ketones, ur: NEGATIVE mg/dL
Leukocytes, UA: NEGATIVE
NITRITE: NEGATIVE
PH: 6 (ref 5.0–8.0)
Protein, ur: NEGATIVE mg/dL
Specific Gravity, Urine: 1.021 (ref 1.005–1.030)
Urobilinogen, UA: 0.2 mg/dL (ref 0.0–1.0)

## 2015-03-25 LAB — BASIC METABOLIC PANEL
ANION GAP: 9 (ref 5–15)
BUN: 16 mg/dL (ref 6–20)
CO2: 26 mmol/L (ref 22–32)
Calcium: 9 mg/dL (ref 8.9–10.3)
Chloride: 105 mmol/L (ref 101–111)
Creatinine, Ser: 0.97 mg/dL (ref 0.61–1.24)
GFR calc Af Amer: >60 mL/min (ref >60)
Glucose, Bld: 134 mg/dL — ABNORMAL HIGH (ref 65–99)
Potassium: 3.4 mmol/L — ABNORMAL LOW (ref 3.5–5.1)
SODIUM: 140 mmol/L (ref 135–145)

## 2015-03-25 LAB — CBC WITH DIFFERENTIAL/PLATELET
Basophils Absolute: 0.1 10*3/uL (ref 0.0–0.1)
Basophils Relative: 2 % — ABNORMAL HIGH (ref 0–1)
EOS ABS: 0.8 10*3/uL — AB (ref 0.0–0.7)
Eosinophils Relative: 12 % — ABNORMAL HIGH (ref 0–5)
HCT: 33.7 % — ABNORMAL LOW (ref 39.0–52.0)
HEMOGLOBIN: 10.9 g/dL — AB (ref 13.0–17.0)
Lymphocytes Relative: 40 % (ref 12–46)
Lymphs Abs: 2.4 10*3/uL (ref 0.7–4.0)
MCH: 29.3 pg (ref 26.0–34.0)
MCHC: 32.3 g/dL (ref 30.0–36.0)
MCV: 90.6 fL (ref 78.0–100.0)
MONOS PCT: 14 % — AB (ref 3–12)
Monocytes Absolute: 0.9 10*3/uL (ref 0.1–1.0)
NEUTROS ABS: 2 10*3/uL (ref 1.7–7.7)
Neutrophils Relative %: 32 % — ABNORMAL LOW (ref 43–77)
PLATELETS: 415 10*3/uL — AB (ref 150–400)
RBC: 3.72 MIL/uL — ABNORMAL LOW (ref 4.22–5.81)
RDW: 14.8 % (ref 11.5–15.5)
WBC: 6.1 10*3/uL (ref 4.0–10.5)

## 2015-03-25 NOTE — Discharge Instructions (Signed)
Delirium Delirium (acute confusional state) is a sudden change in a person's brain function that causes the person to become confused for a short period of time. People with delirium often have trouble knowing where they are. Delirium comes on very fast. It can develop in a few days or just a few hours. Delirium usually occurs because of another mental or physical condition. For example, a person might develop delirium after a surgery. It is especially common in elderly people who are sick or in the hospital. People with dementia (a brain disease like Alzheimer's) or people who are near death may also develop delirium.  CAUSES  Delirium occurs when something affects the signals that the brain sends out. These signals can be affected by anything that puts stress on the body and brain, causing brain chemicals to be out of balance. Structural health problems such as acute strokes, bleeding near the brain (intracranial bleeds), and trauma can also cause delirium. Sometimes the exact cause of delirium is not known. Usually, several factors contribute to the development of delirium. Things that may cause a person to develop delirium include:  Surgery, especially when anesthetics are involved.  Chronic medical conditions, such as chronic lung, heart, or kidney disease.  Fever.  Low body temperature (hypothermia).  Infection, such as pneumonia, severe intestinal infection, severe skin infection, or urinary tract infection.  Poor nutrition that leads to very low vitamin or protein levels in the body (malnutrition).  Body fluid loss (dehydration).  Low blood sugar.  High blood sugar, which typically occurs in people with severe diabetes.  Electrolyte abnormalities, such as sodium imbalance or acid-base disorders.  Low oxygen level.  Low blood pressure.  Uncontrolled high blood pressure.  Brain injury (trauma).  Stroke.  Abuse of alcohol or sudden withdrawal of alcohol.  Sudden tobacco  withdrawal if the person is a longtime smoker.  Loss of vision or hearing.  Being strapped down (restrained).  Being in a new setting, such as an elderly person being admitted to a hospital.  Taking illegal drugs or quitting use of those drugs.  Taking certain medicines for pain, sleep, allergies, high blood pressure, anxiety, depression, Parkinson disease, or seizures.  Sundowning syndrome, which is a complication of chronic dementia that can occur in the later part of a person's wake cycle. SYMPTOMS  The main sign of delirium is a sudden change in a person's mental state. This change can come and go. Symptoms may include:  Not being able to pay attention.  Being confused about places, time, and people.  Seeing, hearing, or feeling things that are not real (hallucinations).  Changes in sleep patterns.  Being restless, hyperactive, irritable, and angry.  Extreme mood swings.  Rambling and senseless talking.  Difficulty speaking or understanding speech.  Memory loss.  Changes in consciousness, such as being sleepy, sluggish, lethargic, and withdrawn.  Focusing on things or ideas that are not important.  Unusual body movements or shaking (tremors). DIAGNOSIS  There are no specific tests for delirium, but a health care provider may do the following:  Perform a mental status assessment.The health care provider will check for confusion and lack of awareness by talking with the person and asking questions.  Talk with the person's friends and family. A friend or family member will often need to tell the health care provider about the person's symptoms and medical history, including medicines taken or missed.  Perform a physical exam. The health care provider will perform a physical exam to check for underlying  conditions that may lead to delirium, such as dehydration, malnutrition, trauma, and infection. The exam may include checking for changes in vision, hearing, and the way  the person moves (coordination and reflexes). The health care provider may order tests, such as:  Blood tests.  Urine tests.  Brain imaging, such as a CT scan or MRI scan.  X-rays (to look for lung problems or intestinal blockage). TREATMENT  Treatment will focus on the cause of the delirium. Delirium is a sign of another problem. If that problem can be found and treated, the delirium may go away. Full recovery can take several weeks. Keeping the person safe until the cause can be found and treated (supportive care) is often what is needed. In some cases, medicine may be prescribed to help keep the person calm. People with delirium should not be left alone because they may involuntarily harm themselves. HOME CARE INSTRUCTIONS  Supportive care is provided by the person living with, or caring for, the person with delirium. It involves keeping the person safe, encouraging healthy habits, and helping the person stay aware of his or her surroundings. Take these steps to help care for someone with delirium:  Make sure the person eats a healthy diet.  Make sure the person gets enough fluids. The person should drink enough fluids to keep urine clear or pale yellow.  Do not leave the person alone.  Keep the person on a regular schedule. Maintain regular times for meals, sleeping, and being active.  Keep the home as quiet and stress-free as possible. This is very important at night and will help improve the quality of the person's sleep.  Let lots of sunlight into the home during the day.  Avoid total darkness at night.  Help the person maintain good hygiene to avoid skin infections, bed sores, and pressure ulcers.  Do activities outside as often as possible.  Take the person to see others whenever you can.  Make sure the person uses hearing aids and eyeglasses if needed.  Give frequent verbal reminders of the current time, location, and situation.  Use memory cues, such as clocks,  calendars, and family photos.  Try to keep the person calm. Music or relaxation techniques may be helpful.  Always be on the lookout for symptoms of delirium.  Do not use restraints.  Only give over-the-counter or prescription medicines as directed by the health care provider.  Make sure the person takes all medicines on a regular schedule as directed by the health care provider.  Keep all follow-up appointments. SEEK MEDICAL CARE IF:  Any of the person's symptoms become worse.  New signs of delirium develop.  Caring for the person at home does not seem safe.  The person stops eating, drinking, or communicating.  The person develops vomiting. SEEK IMMEDIATE MEDICAL CARE IF:   Symptoms of delirium do not go away after treatment.  The person develops chest pain or shortness of breath.  The person seems to want to harm someone or harm himself or herself. MAKE SURE YOU:   Understand these instructions.  Will watch the condition.  Will get help right away if the person is not doing well or gets worse. Document Released: 07/12/2012 Document Revised: 03/04/2014 Document Reviewed: 07/12/2012 [facility] Patient Information 2015 [place], [place]. This information is not intended to replace advice given to you by your health care provider. Make sure you discuss any questions you have with your health care provider.

## 2015-03-25 NOTE — ED Provider Notes (Signed)
CSN: [id number]     Arrival date & time 03/25/15  1721 History   First MD Initiated Contact with Patient 03/25/15 1750     Chief Complaint  Patient presents with  . Pleurisy     (Consider location/radiation/quality/duration/timing/severity/associated sxs/prior Treatment) HPI   Todd [name] is a 81 y.o. male who presents for evaluation of "not acting right." His daughter is concerned that his pneumonia is returning. She states that today he is more back to his baseline, but in the last several days has been intermittently able to recognize her, and acting out of character. She reports that this out of character behavior is him being more "giddy, and talkative". He is unable to give any history. His PCP evaluated him at the nursing home, got an x-ray, and sent him here for further evaluation.  Level V Caveat- dementia   Past Medical History  Diagnosis Date  . Asthma   . Hypertension   . A-fib   . Hyperlipidemia   . Shortness of breath   . CHF (congestive heart failure) 10/28/2011  . Forgetfulness   . PVC's (premature ventricular contractions)   . Mild aortic insufficiency   . Dementia     [name] 02/05/2015  . Failure to thrive in adult     /notes 02/05/2015  . COPD (chronic obstructive pulmonary disease)   . Hypothyroidism     [name] 02/05/2015  . Fall 02/04/2015    with altered mental status off of his baseline after he suffered a mechanical fall /notes 02/04/2015  . CAP (community acquired pneumonia)     [name] 02/05/2015  . Colon cancer    Past Surgical History  Procedure Laterality Date  . Colon surgery      Partial hemecolectomy   . Central line insertion  09/08/2011        Family History  Problem Relation Age of Onset  . Hyperlipidemia Father   . Hypertension Father   . Heart disease Father   . Asthma Father   . Stroke Father   . Heart failure Father   . Heart disease Mother   . Heart failure Mother   . Heart attack Sister    History  Substance Use Topics  .  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 Smokeless tobacco: Not on file     Comment: quit 30+ yrs ago  . Alcohol Use: No    Review of Systems  Unable to perform ROS     Allergies  Review of patient's allergies indicates no known allergies.  Home Medications   Prior to Admission medications   Medication Sig Start Date End Date Taking? Authorizing Provider  ADVAIR DISKUS 500-50 MCG/DOSE AEPB INHALE 1 PUFF TWICE A DAY 12/23/14   Todd Frizzle, MD  feeding supplement, ENSURE ENLIVE, (ENSURE ENLIVE) LIQD Take 237 mLs by mouth 2 (two) times daily between meals. 03/11/15   Todd Girt, DO  ferrous sulfate 325 (65 FE) MG tablet TAKE 1 TABLET BY MOUTH EVERY DAY 12/23/14   Todd Frizzle, MD  fluticasone [facility]) 50 MCG/ACT nasal spray Place 2 sprays into both nostrils daily.    Historical Provider, MD  ipratropium (ATROVENT) 0.02 % nebulizer solution Take 2.5 mLs (0.5 mg total) by nebulization every 4 (four) hours as needed for wheezing or shortness of breath. 03/11/15   Todd Girt, DO  levalbuterol (XOPENEX) 0.63 MG/3ML nebulizer solution Take 3 mLs (0.63 mg total) by nebulization every 4 (four) hours as needed for wheezing or shortness of  breath. 03/11/15   Todd Girt, DO  levofloxacin (LEVAQUIN) 750 MG tablet Take 1 tablet (750 mg total) by mouth daily. 03/11/15   Todd Girt, DO  levothyroxine (SYNTHROID, LEVOTHROID) 25 MCG tablet Take 25 mcg by mouth daily.    Historical Provider, MD  losartan (COZAAR) 50 MG tablet Take 50 mg by mouth daily. 01/23/15   Historical Provider, MD  omeprazole (PRILOSEC) 40 MG capsule Take 40 mg by mouth daily.    Historical Provider, MD  pravastatin (PRAVACHOL) 20 MG tablet Take 20 mg by mouth daily.    Historical Provider, MD  rivaroxaban (XARELTO) 20 MG TABS tablet Take 1 tablet (20 mg total) by mouth daily with supper. 03/11/15   Todd Girt, DO  tamsulosin (FLOMAX) 0.4 MG CAPS capsule Take 1 capsule (0.4 mg total) by mouth daily. 03/11/15   Todd Girt, DO  venlafaxine XR (EFFEXOR XR) 37.5 MG 24 hr capsule Take 2 capsules (75 mg total) by mouth daily with breakfast. 2 pills in AM 03/11/15   Todd U Vann, DO   BP 149/80 mmHg  Pulse 76  Temp(Src) 98 F (36.7 C) (Oral)  Resp 22  SpO2 96% Physical Exam  Constitutional: He appears well-developed.  Elderly, frail  HENT:  Head: Normocephalic and atraumatic.  Right Ear: External ear normal.  Left Ear: External ear normal.  Eyes: Conjunctivae and EOM are normal. Pupils are equal, round, and reactive to light.  Neck: Normal range of motion and phonation normal. Neck supple.  Cardiovascular: Normal rate, regular rhythm and normal heart sounds.   Pulmonary/Chest: Effort normal. He exhibits no bony tenderness.  Bilateral rhonchi and decreased air mvt.  Abdominal: Soft. There is no tenderness.  Musculoskeletal: Normal range of motion.  Neurological: He is alert. No cranial nerve deficit or sensory deficit. He exhibits normal muscle tone. Coordination normal.  Skin: Skin is warm, dry and intact.  Psychiatric: He has a normal mood and affect. His behavior is normal.  Nursing note and vitals reviewed.   ED Course  Procedures (including critical care time)  Medications - No data to display  Patient Vitals for the past 24 hrs:  BP Temp Temp src Pulse Resp SpO2  03/25/15 2117 149/80 mmHg 98 F (36.7 C) Oral 76 22 96 %  03/25/15 2030 139/60 mmHg - - (!) 41 20 99 %  03/25/15 2000 139/71 mmHg - - (!) 43 26 95 %  03/25/15 1930 148/82 mmHg - - (!) 56 (!) 29 97 %  03/25/15 1919 155/60 mmHg 97.9 F (36.6 C) Oral 85 20 95 %  03/25/15 1815 163/63 mmHg - - 79 (!) 27 96 %  03/25/15 1800 153/58 mmHg - - (!) 41 26 95 %  03/25/15 1745 150/75 mmHg - - 81 23 96 %  03/25/15 1731 - 98.1 F (36.7 C) Oral - - -    At D/C-  Reevaluation with update and discussion. After initial assessment and treatment, an updated evaluation reveals He remains comfortable. Todd [name]    Labs Review Labs  Reviewed  BASIC METABOLIC PANEL - Abnormal; Notable for the following:    Potassium 3.4 (*)    Glucose, Bld 134 (*)    All other components within normal limits  CBC WITH DIFFERENTIAL/PLATELET - Abnormal; Notable for the following:    RBC 3.72 (*)    Hemoglobin 10.9 (*)    HCT 33.7 (*)    Platelets 415 (*)    Neutrophils Relative % 32 (*)  Monocytes Relative 14 (*)    Eosinophils Relative 12 (*)    Eosinophils Absolute 0.8 (*)    Basophils Relative 2 (*)    All other components within normal limits  URINE CULTURE  URINALYSIS, ROUTINE W REFLEX MICROSCOPIC    Imaging Review Dg Chest 2 View  03/25/2015   CLINICAL DATA:  Respiratory distress  EXAM: CHEST  2 VIEW  COMPARISON:  03/08/2015  FINDINGS: Cardiac shadow is mildly enlarged but stable. The left lung is clear. Improved aeration is noted in the right lung base when compare with the prior study. Diffuse interstitial changes are again noted stable from the prior study. No bony abnormality is seen.  IMPRESSION: Stable interstitial changes.  No acute infiltrate is seen.   Electronically Signed   By: Inez Catalina M.D.   On: 03/25/2015 18:57     EKG Interpretation   Date/Time:  Tuesday Mar 25 2015 17:29:17 EDT Ventricular Rate:  93 PR Interval:    QRS Duration: 100 QT Interval:  417 QTC Calculation: 519 R Axis:   -47 Text Interpretation:  Atrial fibrillation Paired ventricular premature  complexes Left anterior fascicular block Low voltage, extremity leads  Anteroseptal infarct, old since last tracing no significant change  Confirmed by Eulis Foster  MD, Concepcion Kirkpatrick [phone]) on 03/25/2015 5:55:07 PM      MDM   Final diagnoses:  Delirium    Nonspecific delirium, with reassuring evaluation. Doubt serious bacterial infection, metabolic instability or impending vascular collapse.  Nursing Notes Reviewed/ Care Coordinated Applicable Imaging Reviewed Interpretation of Laboratory Data incorporated into ED treatment  The patient appears  reasonably screened and/or stabilized for discharge and I doubt any other medical condition or other [facility] requiring further screening, evaluation, or treatment in the ED at this time prior to discharge.  Plan: Home Medications- usual; Home Treatments- rest; return here if the recommended treatment, does not improve the symptoms; Recommended follow up- PCP 1 week    Daleen Bo, MD 03/26/15 [phone]

## 2015-03-25 NOTE — ED Notes (Addendum)
Pt sts he does not think he can pee.  Pt wearing a diaper at this time.  Will attempt in and out for urine collection.

## 2015-03-25 NOTE — ED Notes (Signed)
EDP at bedside  

## 2015-03-25 NOTE — ED Notes (Signed)
E-signature pad not working in room.  Pt and daughter in room at time of discharge.  All questions answered, pt/daughter referred to MyChart for patient results for [place]'s review.  Pt and daughter verbalized understanding of discharge paperwork.

## 2015-03-25 NOTE — ED Notes (Signed)
RN from [facility] called.  Sts there was a miscommunication and pt was to be admitted outpatient solely for chest xray.   MD made aware.

## 2015-03-25 NOTE — ED Notes (Signed)
Per EMS: Pt here from [facility].  Dr. Sheppard Coil resident Dr at facility had a portable chest xray done on patient earlier and saw "concerning signs for pleurisy/pneumothorax.  Sent pt here for xray/evaluation.  Pt not c/o SOB, but RR elevated.  Alert in room at this time.

## 2015-03-25 NOTE — ED Notes (Signed)
In and out done with Kandee Keen, RN

## 2015-03-26 NOTE — ED Notes (Signed)
DNR paperwork left behind by [facility] when pt transported.  Called [facility] to inform staff that paperwork will be sent back with [facility] during other pt transport.  File clearly marked and separated from other pt's paperwork.  Staff verbalized understanding.

## 2015-03-27 ENCOUNTER — Other Ambulatory Visit: Payer: Self-pay | Admitting: Family Medicine

## 2015-03-27 LAB — URINE CULTURE
Colony Count: NO GROWTH
Culture: NO GROWTH

## 2015-03-29 ENCOUNTER — Inpatient Hospital Stay (HOSPITAL_COMMUNITY): Payer: Medicare Other

## 2015-03-29 ENCOUNTER — Emergency Department (HOSPITAL_COMMUNITY): Payer: Medicare Other

## 2015-03-29 ENCOUNTER — Inpatient Hospital Stay (HOSPITAL_COMMUNITY)
Admission: EM | Admit: 2015-03-29 | Discharge: 2015-04-05 | DRG: 177 | Disposition: A | Discharge status: Skilled Nursing Facility | Payer: Medicare Other | Attending: Internal Medicine | Admitting: Internal Medicine

## 2015-03-29 ENCOUNTER — Encounter (HOSPITAL_COMMUNITY): Payer: Self-pay

## 2015-03-29 DIAGNOSIS — I351 Nonrheumatic aortic (valve) insufficiency: Secondary | ICD-10-CM | POA: Diagnosis present

## 2015-03-29 DIAGNOSIS — F0390 Unspecified dementia without behavioral disturbance: Secondary | ICD-10-CM | POA: Diagnosis present

## 2015-03-29 DIAGNOSIS — R1310 Dysphagia, unspecified: Secondary | ICD-10-CM

## 2015-03-29 DIAGNOSIS — J690 Pneumonitis due to inhalation of food and vomit: Secondary | ICD-10-CM | POA: Diagnosis not present

## 2015-03-29 DIAGNOSIS — I472 Ventricular tachycardia: Secondary | ICD-10-CM | POA: Diagnosis present

## 2015-03-29 DIAGNOSIS — Y95 Nosocomial condition: Secondary | ICD-10-CM | POA: Diagnosis present

## 2015-03-29 DIAGNOSIS — Z515 Encounter for palliative care: Secondary | ICD-10-CM

## 2015-03-29 DIAGNOSIS — I5032 Chronic diastolic (congestive) heart failure: Secondary | ICD-10-CM | POA: Diagnosis not present

## 2015-03-29 DIAGNOSIS — J9809 Other diseases of bronchus, not elsewhere classified: Secondary | ICD-10-CM

## 2015-03-29 DIAGNOSIS — R627 Adult failure to thrive: Secondary | ICD-10-CM | POA: Diagnosis present

## 2015-03-29 DIAGNOSIS — I10 Essential (primary) hypertension: Secondary | ICD-10-CM | POA: Diagnosis present

## 2015-03-29 DIAGNOSIS — Z6822 Body mass index (BMI) 22.0-22.9, adult: Secondary | ICD-10-CM | POA: Diagnosis not present

## 2015-03-29 DIAGNOSIS — E039 Hypothyroidism, unspecified: Secondary | ICD-10-CM | POA: Diagnosis present

## 2015-03-29 DIAGNOSIS — M4854XA Collapsed vertebra, not elsewhere classified, thoracic region, initial encounter for fracture: Secondary | ICD-10-CM | POA: Diagnosis present

## 2015-03-29 DIAGNOSIS — Z7901 Long term (current) use of anticoagulants: Secondary | ICD-10-CM | POA: Diagnosis not present

## 2015-03-29 DIAGNOSIS — H9190 Unspecified hearing loss, unspecified ear: Secondary | ICD-10-CM | POA: Diagnosis present

## 2015-03-29 DIAGNOSIS — E785 Hyperlipidemia, unspecified: Secondary | ICD-10-CM | POA: Diagnosis present

## 2015-03-29 DIAGNOSIS — Z87891 Personal history of nicotine dependence: Secondary | ICD-10-CM | POA: Diagnosis not present

## 2015-03-29 DIAGNOSIS — J189 Pneumonia, unspecified organism: Secondary | ICD-10-CM | POA: Diagnosis not present

## 2015-03-29 DIAGNOSIS — Z66 Do not resuscitate: Secondary | ICD-10-CM

## 2015-03-29 DIAGNOSIS — IMO0001 Reserved for inherently not codable concepts without codable children: Secondary | ICD-10-CM

## 2015-03-29 DIAGNOSIS — J9601 Acute respiratory failure with hypoxia: Secondary | ICD-10-CM | POA: Diagnosis present

## 2015-03-29 DIAGNOSIS — J449 Chronic obstructive pulmonary disease, unspecified: Secondary | ICD-10-CM | POA: Diagnosis present

## 2015-03-29 DIAGNOSIS — Z79899 Other long term (current) drug therapy: Secondary | ICD-10-CM | POA: Diagnosis not present

## 2015-03-29 DIAGNOSIS — A419 Sepsis, unspecified organism: Secondary | ICD-10-CM | POA: Diagnosis not present

## 2015-03-29 DIAGNOSIS — E038 Other specified hypothyroidism: Secondary | ICD-10-CM | POA: Diagnosis not present

## 2015-03-29 DIAGNOSIS — Z85038 Personal history of other malignant neoplasm of large intestine: Secondary | ICD-10-CM | POA: Diagnosis not present

## 2015-03-29 DIAGNOSIS — D509 Iron deficiency anemia, unspecified: Secondary | ICD-10-CM | POA: Diagnosis present

## 2015-03-29 DIAGNOSIS — I110 Hypertensive heart disease with heart failure: Secondary | ICD-10-CM

## 2015-03-29 DIAGNOSIS — R0600 Dyspnea, unspecified: Secondary | ICD-10-CM

## 2015-03-29 DIAGNOSIS — E43 Unspecified severe protein-calorie malnutrition: Secondary | ICD-10-CM | POA: Diagnosis present

## 2015-03-29 DIAGNOSIS — I4729 Other ventricular tachycardia: Secondary | ICD-10-CM

## 2015-03-29 DIAGNOSIS — I482 Chronic atrial fibrillation, unspecified: Secondary | ICD-10-CM | POA: Diagnosis present

## 2015-03-29 DIAGNOSIS — T17500A Unspecified foreign body in bronchus causing asphyxiation, initial encounter: Secondary | ICD-10-CM

## 2015-03-29 LAB — PROTIME-INR
INR: 1.81 — ABNORMAL HIGH (ref 0.00–1.49)
Prothrombin Time: 20.9 seconds — ABNORMAL HIGH (ref 11.6–15.2)

## 2015-03-29 LAB — BASIC METABOLIC PANEL
Anion gap: 10 (ref 5–15)
BUN: 11 mg/dL (ref 6–20)
CHLORIDE: 103 mmol/L (ref 101–111)
CO2: 26 mmol/L (ref 22–32)
CREATININE: 0.88 mg/dL (ref 0.61–1.24)
Calcium: 9.2 mg/dL (ref 8.9–10.3)
GFR calc Af Amer: >60 mL/min (ref >60)
Glucose, Bld: 163 mg/dL — ABNORMAL HIGH (ref 65–99)
Potassium: 3.7 mmol/L (ref 3.5–5.1)
Sodium: 139 mmol/L (ref 135–145)

## 2015-03-29 LAB — COMPREHENSIVE METABOLIC PANEL
ALBUMIN: 3.3 g/dL — AB (ref 3.5–5.0)
ALT: 10 U/L — ABNORMAL LOW (ref 17–63)
ANION GAP: 16 — AB (ref 5–15)
AST: 18 U/L (ref 15–41)
Alkaline Phosphatase: 106 U/L (ref 38–126)
BUN: 12 mg/dL (ref 6–20)
CALCIUM: 8.8 mg/dL — AB (ref 8.9–10.3)
CO2: 21 mmol/L — ABNORMAL LOW (ref 22–32)
Chloride: 101 mmol/L (ref 101–111)
Creatinine, Ser: 0.92 mg/dL (ref 0.61–1.24)
GFR calc non Af Amer: >60 mL/min (ref >60)
Glucose, Bld: 135 mg/dL — ABNORMAL HIGH (ref 65–99)
POTASSIUM: 3.6 mmol/L (ref 3.5–5.1)
SODIUM: 138 mmol/L (ref 135–145)
Total Bilirubin: 0.4 mg/dL (ref 0.3–1.2)
Total Protein: 6.7 g/dL (ref 6.5–8.1)

## 2015-03-29 LAB — I-STAT ARTERIAL BLOOD GAS, ED
BICARBONATE: 24.6 meq/L — AB (ref 20.0–24.0)
O2 SAT: 94 %
PH ART: 7.404 (ref 7.350–7.450)
PO2 ART: 70 mmHg — AB (ref 80.0–100.0)
Patient temperature: 98.3
TCO2: 26 mmol/L (ref 0–100)
pCO2 arterial: 39.3 mmHg (ref 35.0–45.0)

## 2015-03-29 LAB — APTT: aPTT: 43 seconds — ABNORMAL HIGH (ref 24–37)

## 2015-03-29 LAB — CBC WITH DIFFERENTIAL/PLATELET
BASOS PCT: 1 % (ref 0–1)
Basophils Absolute: 0.1 10*3/uL (ref 0.0–0.1)
EOS ABS: 1.4 10*3/uL — AB (ref 0.0–0.7)
Eosinophils Relative: 14 % — ABNORMAL HIGH (ref 0–5)
HCT: 34.2 % — ABNORMAL LOW (ref 39.0–52.0)
Hemoglobin: 11.2 g/dL — ABNORMAL LOW (ref 13.0–17.0)
Lymphocytes Relative: 28 % (ref 12–46)
Lymphs Abs: 2.8 10*3/uL (ref 0.7–4.0)
MCH: 29.5 pg (ref 26.0–34.0)
MCHC: 32.7 g/dL (ref 30.0–36.0)
MCV: 90 fL (ref 78.0–100.0)
MONO ABS: 1.3 10*3/uL — AB (ref 0.1–1.0)
Monocytes Relative: 13 % — ABNORMAL HIGH (ref 3–12)
NEUTROS PCT: 44 % (ref 43–77)
Neutro Abs: 4.4 10*3/uL (ref 1.7–7.7)
PLATELETS: 365 10*3/uL (ref 150–400)
RBC: 3.8 MIL/uL — ABNORMAL LOW (ref 4.22–5.81)
RDW: 14.8 % (ref 11.5–15.5)
WBC: 9.9 10*3/uL (ref 4.0–10.5)

## 2015-03-29 LAB — I-STAT CHEM 8, ED
BUN: 13 mg/dL (ref 6–20)
CREATININE: 0.8 mg/dL (ref 0.61–1.24)
Calcium, Ion: 1.21 mmol/L (ref 1.13–1.30)
Chloride: 102 mmol/L (ref 101–111)
Glucose, Bld: 164 mg/dL — ABNORMAL HIGH (ref 65–99)
HCT: 38 % — ABNORMAL LOW (ref 39.0–52.0)
Hemoglobin: 12.9 g/dL — ABNORMAL LOW (ref 13.0–17.0)
Potassium: 3.7 mmol/L (ref 3.5–5.1)
Sodium: 140 mmol/L (ref 135–145)
TCO2: 22 mmol/L (ref 0–100)

## 2015-03-29 LAB — PROCALCITONIN

## 2015-03-29 LAB — I-STAT CG4 LACTIC ACID, ED
LACTIC ACID, VENOUS: 1.1 mmol/L (ref 0.5–2.0)
Lactic Acid, Venous: 2.03 mmol/L (ref 0.5–2.0)

## 2015-03-29 LAB — MRSA PCR SCREENING: MRSA BY PCR: NEGATIVE

## 2015-03-29 LAB — I-STAT TROPONIN, ED: Troponin i, poc: 0.01 ng/mL (ref 0.00–0.08)

## 2015-03-29 LAB — BRAIN NATRIURETIC PEPTIDE: B Natriuretic Peptide: 327.5 pg/mL — ABNORMAL HIGH (ref 0.0–100.0)

## 2015-03-29 LAB — MAGNESIUM: Magnesium: 1.9 mg/dL (ref 1.7–2.4)

## 2015-03-29 MED ORDER — VANCOMYCIN HCL 500 MG IV SOLR
500.0000 mg | Freq: Two times a day (BID) | INTRAVENOUS | Status: DC
  Administered 2015-03-29 – 2015-04-02 (×8): 500 mg via INTRAVENOUS
  Filled 2015-03-29 (×10): qty 500

## 2015-03-29 MED ORDER — VANCOMYCIN HCL IN DEXTROSE 1-5 GM/200ML-% IV SOLN
1000.0000 mg | Freq: Once | INTRAVENOUS | Status: DC
  Filled 2015-03-29: qty 200

## 2015-03-29 MED ORDER — VANCOMYCIN HCL IN DEXTROSE 1-5 GM/200ML-% IV SOLN
1000.0000 mg | Freq: Once | INTRAVENOUS | Status: AC
  Administered 2015-03-29: 1000 mg via INTRAVENOUS
  Filled 2015-03-29: qty 200

## 2015-03-29 MED ORDER — FOLIC ACID 5 MG/ML IJ SOLN
1.0000 mg | Freq: Every day | INTRAMUSCULAR | Status: DC
  Administered 2015-03-29 – 2015-03-30 (×2): 1 mg via INTRAVENOUS
  Filled 2015-03-29 (×2): qty 0.2

## 2015-03-29 MED ORDER — IPRATROPIUM-ALBUTEROL 0.5-2.5 (3) MG/3ML IN SOLN: 3.0000 mL | RESPIRATORY_TRACT | Status: DC

## 2015-03-29 MED ORDER — KCL IN DEXTROSE-NACL 20-5-0.9 MEQ/L-%-% IV SOLN
INTRAVENOUS | Status: DC
  Administered 2015-03-29 – 2015-03-31 (×2): via INTRAVENOUS
  Filled 2015-03-29 (×4): qty 1000

## 2015-03-29 MED ORDER — DEXTROSE 5 % IV SOLN
2.0000 g | INTRAVENOUS | Status: DC
  Administered 2015-03-30 – 2015-04-02 (×4): 2 g via INTRAVENOUS
  Filled 2015-03-29 (×4): qty 2

## 2015-03-29 MED ORDER — GUAIFENESIN ER 600 MG PO TB12
1200.0000 mg | ORAL_TABLET | Freq: Two times a day (BID) | ORAL | Status: DC
  Administered 2015-03-30 – 2015-04-01 (×6): 1200 mg via ORAL
  Filled 2015-03-29 (×10): qty 2

## 2015-03-29 MED ORDER — THIAMINE HCL 100 MG/ML IJ SOLN
100.0000 mg | Freq: Every day | INTRAMUSCULAR | Status: DC
  Administered 2015-03-29 – 2015-03-30 (×2): 100 mg via INTRAVENOUS
  Filled 2015-03-29 (×2): qty 1

## 2015-03-29 MED ORDER — ONDANSETRON HCL 4 MG PO TABS: 4.0000 mg | ORAL_TABLET | Freq: Four times a day (QID) | ORAL | Status: DC | PRN

## 2015-03-29 MED ORDER — IOHEXOL 350 MG/ML SOLN
90.0000 mL | Freq: Once | INTRAVENOUS | Status: AC | PRN
Start: 2015-03-29 — End: 2015-03-29
  Administered 2015-03-29: 90 mL via INTRAVENOUS

## 2015-03-29 MED ORDER — IPRATROPIUM BROMIDE 0.02 % IN SOLN
2.0000 mg | Freq: Once | RESPIRATORY_TRACT | Status: AC
  Administered 2015-03-29: 2 mg via RESPIRATORY_TRACT
  Filled 2015-03-29: qty 10

## 2015-03-29 MED ORDER — IPRATROPIUM-ALBUTEROL 0.5-2.5 (3) MG/3ML IN SOLN
3.0000 mL | RESPIRATORY_TRACT | Status: DC
  Administered 2015-03-29 – 2015-03-30 (×8): 3 mL via RESPIRATORY_TRACT
  Filled 2015-03-29 (×8): qty 3

## 2015-03-29 MED ORDER — PANTOPRAZOLE SODIUM 40 MG PO TBEC
40.0000 mg | DELAYED_RELEASE_TABLET | Freq: Two times a day (BID) | ORAL | Status: DC
  Administered 2015-03-29 – 2015-03-31 (×4): 40 mg via ORAL
  Filled 2015-03-29 (×4): qty 1

## 2015-03-29 MED ORDER — ALBUTEROL SULFATE (2.5 MG/3ML) 0.083% IN NEBU
10.0000 mg | INHALATION_SOLUTION | Freq: Once | RESPIRATORY_TRACT | Status: AC
  Administered 2015-03-29: 10 mg via RESPIRATORY_TRACT
  Filled 2015-03-29: qty 12

## 2015-03-29 MED ORDER — LEVOTHYROXINE SODIUM 100 MCG IV SOLR
12.5000 ug | Freq: Every day | INTRAVENOUS | Status: DC
  Administered 2015-03-29 – 2015-03-30 (×2): 12.5 ug via INTRAVENOUS
  Filled 2015-03-29 (×3): qty 5

## 2015-03-29 MED ORDER — SODIUM CHLORIDE 0.9 % IV SOLN
INTRAVENOUS | Status: DC
  Administered 2015-03-29: 07:00:00 via INTRAVENOUS

## 2015-03-29 MED ORDER — RIVAROXABAN 20 MG PO TABS
20.0000 mg | ORAL_TABLET | Freq: Every day | ORAL | Status: DC
  Administered 2015-03-29 – 2015-03-30 (×2): 20 mg via ORAL
  Filled 2015-03-29 (×3): qty 1

## 2015-03-29 MED ORDER — PIPERACILLIN-TAZOBACTAM 3.375 G IVPB 30 MIN
3.3750 g | Freq: Once | INTRAVENOUS | Status: AC
  Administered 2015-03-29: 3.375 g via INTRAVENOUS
  Filled 2015-03-29: qty 50

## 2015-03-29 MED ORDER — DEXTROSE 5 % IV SOLN
2.0000 g | Freq: Once | INTRAVENOUS | Status: AC
  Administered 2015-03-29: 2 g via INTRAVENOUS
  Filled 2015-03-29: qty 2

## 2015-03-29 MED ORDER — ONDANSETRON HCL 4 MG/2ML IJ SOLN
4.0000 mg | Freq: Four times a day (QID) | INTRAMUSCULAR | Status: DC | PRN
  Administered 2015-04-03: 4 mg via INTRAVENOUS
  Filled 2015-03-29: qty 2

## 2015-03-29 MED ORDER — ALBUTEROL (5 MG/ML) CONTINUOUS INHALATION SOLN
INHALATION_SOLUTION | RESPIRATORY_TRACT | Status: AC
  Administered 2015-03-29: 08:00:00
  Filled 2015-03-29: qty 20

## 2015-03-29 MED ORDER — METHYLPREDNISOLONE SODIUM SUCC 125 MG IJ SOLR
80.0000 mg | Freq: Three times a day (TID) | INTRAMUSCULAR | Status: DC
  Administered 2015-03-29 – 2015-03-30 (×4): 80 mg via INTRAVENOUS
  Filled 2015-03-29: qty 1.28
  Filled 2015-03-29 (×2): qty 2
  Filled 2015-03-29 (×3): qty 1.28

## 2015-03-29 NOTE — Progress Notes (Signed)
ANTIBIOTIC CONSULT NOTE - INITIAL  Pharmacy Consult for Cefepime and Vancomycin Indication: Sepsis / PNA  No Known Allergies  Patient Measurements: TBW 70 kg earlier this month  Vital Signs: Temp: 99.2 F (37.3 C) (05/28 1147) Temp Source: Oral (05/28 1147) BP: 135/79 mmHg (05/28 1147) Pulse Rate: 88 (05/28 1147) Intake/Output from previous day:   Intake/Output from this shift: Total I/O In: 250 [IV Piggyback:250] Out: -   Labs:  Recent Labs  03/29/15 0420 03/29/15 0428 03/29/15 0737  WBC  --   --  9.9  HGB  --  12.9* 11.2*  PLT  --   --  365  CREATININE 0.88 0.80 0.92   CrCl cannot be calculated (Unknown ideal weight.). No results for input(s): VANCOTROUGH, VANCOPEAK, VANCORANDOM, GENTTROUGH, GENTPEAK, GENTRANDOM, TOBRATROUGH, TOBRAPEAK, TOBRARND, AMIKACINPEAK, AMIKACINTROU, AMIKACIN in the last 72 hours.   Microbiology: Recent Results (from the past 720 hour(s))  Culture, blood (routine x 2)     Status: None   Collection Time: 03/08/15  9:37 AM  Result Value Ref Range Status   Specimen Description BLOOD RIGHT WRIST  Final   Special Requests BOTTLES DRAWN AEROBIC AND ANAEROBIC 5CC  Final   Culture   Final    NO GROWTH 5 DAYS Performed at [facility]    Report Status 03/14/2015 FINAL  Final  Culture, blood (routine x 2)     Status: None   Collection Time: 03/08/15  9:45 AM  Result Value Ref Range Status   Specimen Description BLOOD RIGHT ARM  Final   Special Requests BOTTLES DRAWN AEROBIC AND ANAEROBIC 10ML  Final   Culture   Final    NO GROWTH 5 DAYS Performed at [facility]    Report Status 03/14/2015 FINAL  Final  Urine culture     Status: None   Collection Time: 03/08/15 10:10 AM  Result Value Ref Range Status   Specimen Description URINE, CATHETERIZED  Final   Special Requests NONE  Final   Colony Count NO GROWTH Performed at [facility]   Final   Culture NO GROWTH Performed at [facility]   Final   Report  Status 03/09/2015 FINAL  Final  MRSA PCR Screening     Status: None   Collection Time: 03/08/15  3:30 PM  Result Value Ref Range Status   MRSA by PCR NEGATIVE NEGATIVE Final    Comment:        The GeneXpert MRSA Assay (FDA approved for NASAL specimens only), is one component of a comprehensive MRSA colonization surveillance program. It is not intended to diagnose MRSA infection nor to guide or monitor treatment for MRSA infections.   Urine culture     Status: None   Collection Time: 03/25/15  7:48 PM  Result Value Ref Range Status   Specimen Description URINE, CLEAN CATCH  Final   Special Requests NONE  Final   Colony Count NO GROWTH Performed at [facility]   Final   Culture NO GROWTH Performed at [facility]   Final   Report Status 03/27/2015 FINAL  Final  MRSA PCR Screening     Status: None   Collection Time: 03/29/15  9:15 AM  Result Value Ref Range Status   MRSA by PCR NEGATIVE NEGATIVE Final    Comment:        The GeneXpert MRSA Assay (FDA approved for NASAL specimens only), is one component of a comprehensive MRSA colonization surveillance program. It is not intended to diagnose  MRSA infection nor to guide or monitor treatment for MRSA infections.     Medical History: Past Medical History  Diagnosis Date  . Asthma   . Hypertension   . A-fib   . Hyperlipidemia   . Shortness of breath   . CHF (congestive heart failure) 10/28/2011  . Forgetfulness   . PVC's (premature ventricular contractions)   . Mild aortic insufficiency   . Dementia     Todd [name] 02/05/2015  . Failure to thrive in adult     /notes 02/05/2015  . COPD (chronic obstructive pulmonary disease)   . Hypothyroidism     Todd [name] 02/05/2015  . Fall 02/04/2015    with altered mental status off of his baseline after he suffered a mechanical fall /notes 02/04/2015  . CAP (community acquired pneumonia)     Todd [name] 02/05/2015  . Colon cancer     Assessment: 81 yo M presents on 5/28  after recent discharge for COPD exacerbation and CAP. Comes in today with a CC of cough and increased work of breathing. Cultures were drawn. Pharmacy to dose vancomycin and cefepime for sepsis/HCAP. Pt is afebrile and WBC wnl. SCr 0.92, CrCl ~37ml/min. Received 1g of vancomycin this am around 0700. One time dose of cefepime 2g ordered for 1130.  Goal of Therapy:  Resolution of infection Vancomycin trough level 15-20 mcg/ml  Plan:  Will start vancomycin 500mg  IV Q12 now (completes LD) Start cefepime 2g IV Q24  Monitor clinical picture, renal function F/U C&S, abx LOT   Todd [name] J 03/29/2015,12:02 PM

## 2015-03-29 NOTE — ED Notes (Signed)
Pt is resting and relaxed, pt responds verbally. Pt not coughing or breathing as heavy as he was upon arrival.

## 2015-03-29 NOTE — Evaluation (Signed)
Clinical/Bedside Swallow Evaluation Patient Details  Name: AUDRIC VENN MRN: [id number] Date of Birth: Sep 19, 1924  Today's Date: 03/29/2015 Time: SLP Start Time (ACUTE ONLY): 0136 SLP Stop Time (ACUTE ONLY): 0207 SLP Time Calculation (min) (ACUTE ONLY): 31 min  Past Medical History:  Past Medical History  Diagnosis Date  . Asthma   . Hypertension   . A-fib   . Hyperlipidemia   . Shortness of breath   . CHF (congestive heart failure) 10/28/2011  . Forgetfulness   . PVC's (premature ventricular contractions)   . Mild aortic insufficiency   . Dementia     [name] 02/05/2015  . Failure to thrive in adult     /notes 02/05/2015  . COPD (chronic obstructive pulmonary disease)   . Hypothyroidism     [name] 02/05/2015  . Fall 02/04/2015    with altered mental status off of his baseline after he suffered a mechanical fall /notes 02/04/2015  . CAP (community acquired pneumonia)     [name] 02/05/2015  . Colon cancer    Past Surgical History:  Past Surgical History  Procedure Laterality Date  . Colon surgery      Partial hemecolectomy   . Central line insertion  09/08/2011        HPI:  This is a 81 yo male recent discharge 5/10 after tx for COPD exacerbation and CAP. DC to SNF for rehab on D3 diet. History of chronic A. fib on Xarelto, hypertension, iron deficiency anemia, hypothyroidism chronic DHF and history biliary pancreatitis status post cholecystectomy. Sent from SNF 2/2 cough with congestion and respiratory distress. CT chest revealed extensive bronchitis and mucoid impaction and likely bronchopneumonia right upper lobe as well as acute T12 comp fx.   Assessment / Plan / Recommendation Clinical Impression  Pt with congested breathing and acute coughing prior to initiation of bedside swallow evaluation. Respiratory compromise putting patient at higher risk for aspiration. Delayed coughing noted following ice chip and thin liquid trials concerning for poor airway protection. Reduction in  coughing evidenced with nectar thick liquids by spoon. Family reports poor PO intake with dysphagia 1 consistency. Recommend dysphagia 3 and nectar thick diet. Medicines crushed with puree. No straws and full supervision throughout meals with feeding assistance.  Anticipate as respiratory status improves, pt will tolerate upgraded liquid trials. Contiue skilled interveniton for diet tolerance, pt and family education, and upgraded PO trials.     Aspiration Risk  Moderate    Diet Recommendation Dysphagia 3 (Mech soft);Nectar   Medication Administration: Crushed with puree Compensations: Clear throat intermittently;Slow rate;Small sips/bites;Multiple dry swallows after each bite/sip    Other  Recommendations Oral Care Recommendations: Oral care BID   Follow Up Recommendations       Frequency and Duration min 2x/week  2 weeks   Pertinent Vitals/Pain     SLP Swallow Goals     Swallow Study Prior Functional Status       General Other Pertinent Information: This is a 81 yo male recent discharge 5/10 after tx for COPD exacerbation and CAP. DC to SNF for rehab on D3 diet. History of chronic A. fib on Xarelto, hypertension, iron deficiency anemia, hypothyroidism chronic DHF and history biliary pancreatitis status post cholecystectomy. Sent from SNF 2/2 cough with congestion and respiratory distress. CT chest revealed extensive bronchitis and mucoid impaction and likely bronchopneumonia right upper lobe as well as acute T12 comp fx. Type of Study: Bedside swallow evaluation Previous Swallow Assessment: 03-11-15 (recommendation dysphagia 3, thin liquids ) Diet Prior to  this Study: NPO Temperature Spikes Noted: Yes Respiratory Status: Room air History of Recent Intubation: No Behavior/Cognition: Alert;Cooperative;Pleasant mood;Lethargic/Drowsy Oral Cavity - Dentition: Poor condition;Missing dentition Self-Feeding Abilities: Needs assist;Able to feed self Patient Positioning: Upright in  bed Baseline Vocal Quality: Breathy;Low vocal intensity Volitional Cough: Weak Volitional Swallow: Able to elicit    Oral/Motor/Sensory Function Overall Oral Motor/Sensory Function: Appears within functional limits for tasks assessed Labial ROM: Within Functional Limits Labial Symmetry: Within Functional Limits Labial Strength: Within Functional Limits Lingual ROM: Within Functional Limits Lingual Symmetry: Within Functional Limits Lingual Strength: Within Functional Limits Facial ROM: Within Functional Limits Facial Symmetry: Within Functional Limits   Ice Chips Ice chips: Impaired Presentation: Spoon Oral Phase Impairments: Reduced labial seal;Impaired mastication Pharyngeal Phase Impairments: Cough - Delayed;Suspected delayed Swallow   Thin Liquid Thin Liquid: Impaired Presentation: Spoon Oral Phase Impairments: Impaired anterior to posterior transit Oral Phase Functional Implications: Prolonged oral transit Pharyngeal  Phase Impairments: Suspected delayed Swallow    Nectar Thick Nectar Thick Liquid: Within functional limits Presentation: Cup;Self Fed;Spoon   Honey Thick Honey Thick Liquid: Not tested   Puree Puree: Within functional limits Presentation: Spoon   Solid   GO    Solid: Impaired Oral Phase Impairments: Impaired anterior to posterior transit      Arvil Chaco MA, CCC-SLP Acute Care Speech Language Pathologist    Arvil Chaco E 03/29/2015,2:22 PM

## 2015-03-29 NOTE — ED Notes (Signed)
RT tech, Heather at bedside.

## 2015-03-29 NOTE — ED Notes (Signed)
Per EMS, called out to [facility] pt presents with cough and congestion, difficulty breathing. EMS VS 128/69, HR 93 and irregular, O2 95% on 6L. EMS got no report from the facility on the pt's norm. Pt coughing and struggling with breathing upon arrival. EMS reported that the pt had pneumonia recently.

## 2015-03-29 NOTE — ED Notes (Signed)
Spoke with pt's nurse by phone, o2 sats were 80%, give neb treatment came up to 92%, pt's HR when up to 130bpm, pt was using accessory muscles but o2 sat came up to 96%. Nurse states that pt has been coughing and breathing like this since 0400 this morning when the nurse entered the room, pt was recently seen here for same. Nurse states that pt has never been on o2 at [facility] rehab.

## 2015-03-29 NOTE — ED Notes (Signed)
In room with patient, pt began coughing and saying "I can't breathe" o2 sat at 96% on 3L [place]. Dr Randal Buba ordered breathing treatment. Pt is now wheezing on both sides. Neb started and respiratory called.

## 2015-03-29 NOTE — ED Notes (Signed)
Shown cg4 lactic acid level to dr.palumbo 2.03

## 2015-03-29 NOTE — H&P (Signed)
[facility] History and Physical                                                                                    Todd [name], is a 81 y.o. male  MRN: [id number]   DOB - Nov 13, 1923  Admit Date - 03/29/2015  Outpatient Primary MD for the patient is Odette Fraction, MD  Referring MD: Randal Buba / ER  With History of -  Past Medical History  Diagnosis Date  . Asthma   . Hypertension   . A-fib   . Hyperlipidemia   . Shortness of breath   . CHF (congestive heart failure) 10/28/2011  . Forgetfulness   . PVC's (premature ventricular contractions)   . Mild aortic insufficiency   . Dementia     [name] 02/05/2015  . Failure to thrive in adult     /notes 02/05/2015  . COPD (chronic obstructive pulmonary disease)   . Hypothyroidism     [name] 02/05/2015  . Fall 02/04/2015    with altered mental status off of his baseline after he suffered a mechanical fall /notes 02/04/2015  . CAP (community acquired pneumonia)     [name] 02/05/2015  . Colon cancer       Past Surgical History  Procedure Laterality Date  . Colon surgery      Partial hemecolectomy   . Central line insertion  09/08/2011         in for   Chief Complaint  Patient presents with  . Cough     HPI This is a 81 yo male recent discharge 5/10 after tx for COPD exacerbation and CAP. DC to SNF for rehab on D3 diet. History of chronic A. fib on Xarelto, hypertension, iron deficiency anemia, hypothyroidism chronic DHF and history biliary pancreatitis status post cholecystectomy. Patient unable to provide history so obtained from grandson via phone. Since discharge PNA sxs apparently resolved but patient never back to baseline-poor oral intake and continued dysmotility. Sent from SNF 2/2 cough with congestion and respiratory distress. Per EMS hemodynamically stable but O2 sats 95% on 6L.Marland Kitchen Observed with increased work of breathing. Upon arrival to ER patient with continued increased work of breathing and tachypnea. ABG with  PO2 70 on 6L. given nebs with some improvement but not resolution of symptoms. CT chest revealed extensive bronchitis and mucoid impaction and likely bronchopneumonia right upper lobe as well as acute T12 comp fx. Patient is DO NOT RESUSCITATE. WBC 9,900, Hg 11.2, initial lactic acid 2.03 but decr to 1.10 after treatment and fluids, electrolyte panel normal except for glucose 164, K+ 3.7   Review of Systems   In addition to the HPI above,  Unable to obtain from patient directly. Did speak with grandson [name] who reported apparent improvement in pneumonia symptoms after initial presentation to SNF but failure to thrive symptoms as evidenced by poor oral intake and progressive cough at least over the past several days, no reports of acute back pain.  *A full 10 point Review of Systems was done, except as stated above, all other Review of Systems were negative.  Social History History  Substance Use Topics  . 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  Smokeless tobacco: Not on file     Comment: quit 30+ yrs ago  . Alcohol Use: No    Resides at: [facility]  Lives with: N/A  Ambulatory status: with assistance PTA   Family History Family History  Problem Relation Age of Onset  . Hyperlipidemia Father   . Hypertension Father   . Heart disease Father   . Asthma Father   . Stroke Father   . Heart failure Father   . Heart disease Mother   . Heart failure Mother   . Heart attack Sister      Prior to Admission medications   Medication Sig Start Date End Date Taking? Authorizing Provider  ADVAIR DISKUS 500-50 MCG/DOSE AEPB INHALE 1 PUFF TWICE A DAY 12/23/14  Yes Susy Frizzle, MD  feeding supplement, ENSURE ENLIVE, (ENSURE ENLIVE) LIQD Take 237 mLs by mouth 2 (two) times daily between meals. 03/11/15  Yes Geradine Girt, DO  ferrous sulfate 325 (65 FE) MG tablet TAKE 1 TABLET BY MOUTH EVERY DAY 12/23/14  Yes Susy Frizzle, MD  fluticasone [facility]) 50 MCG/ACT nasal spray Place 2  sprays into both nostrils daily.   Yes Historical Provider, MD  guaiFENesin 200 MG tablet Take 200 mg by mouth every 4 (four) hours as needed for cough or to loosen phlegm.   Yes Historical Provider, MD  ipratropium-albuterol (DUONEB) 0.5-2.5 (3) MG/3ML SOLN Take 3 mLs by nebulization every 6 (six) hours as needed (SOB/Wheezing).   Yes Historical Provider, MD  levothyroxine (SYNTHROID, LEVOTHROID) 25 MCG tablet TAKE 1 TABLET BY MOUTH EVERY DAY 03/27/15  Yes Susy Frizzle, MD  losartan (COZAAR) 50 MG tablet Take 50 mg by mouth daily. 01/23/15  Yes Historical Provider, MD  omeprazole (PRILOSEC) 40 MG capsule Take 40 mg by mouth daily.   Yes Historical Provider, MD  pravastatin (PRAVACHOL) 20 MG tablet Take 20 mg by mouth daily.   Yes Historical Provider, MD  rivaroxaban (XARELTO) 20 MG TABS tablet Take 1 tablet (20 mg total) by mouth daily with supper. 03/11/15  Yes Geradine Girt, DO  venlafaxine XR (EFFEXOR XR) 37.5 MG 24 hr capsule Take 2 capsules (75 mg total) by mouth daily with breakfast. 2 pills in AM 03/11/15  Yes Jessica U Vann, DO  ipratropium (ATROVENT) 0.02 % nebulizer solution Take 2.5 mLs (0.5 mg total) by nebulization every 4 (four) hours as needed for wheezing or shortness of breath. Patient not taking: Reported on 03/29/2015 03/11/15   Geradine Girt, DO  levalbuterol (XOPENEX) 0.63 MG/3ML nebulizer solution Take 3 mLs (0.63 mg total) by nebulization every 4 (four) hours as needed for wheezing or shortness of breath. Patient not taking: Reported on 03/29/2015 03/11/15   Geradine Girt, DO  tamsulosin (FLOMAX) 0.4 MG CAPS capsule Take 1 capsule (0.4 mg total) by mouth daily. Patient not taking: Reported on 03/29/2015 03/11/15   Geradine Girt, DO    No Known Allergies  Physical Exam  Vitals  Blood pressure 163/82, pulse 97, temperature 98.3 F (36.8 C), temperature source Oral, resp. rate 31, SpO2 99 %.   General:  In moderate respiratory distress as evidenced by increased work of  breathing and difficulty speaking secondary to respiratory symptoms  Psych: Somewhat lethargic but awakens; unable to participate adequately with this portion of exam because of respiratory distress  Neuro:   No apparent focal neurological deficits, CN II through XII intact, Strength 3/5 all 4 extremities, Sensation intact all 4 extremities.  ENT:  Ears  unremarkable, oral mucosa dry, eyes with periorbital redness and some white to yellow exudate bilaterally  Neck:  Supple, No lymphadenopathy appreciated  Respiratory:  Symmetrical chest wall movement, bilateral lung sounds with coarse sounds primarily expiratory rhonchi upper airways with sounds diminished at bases, increased work of breathing and wet sounding cough, 3 L oxygen respiratory rate in the 30s  Cardiac: Regular with atrial fib on telemetry, No Murmurs, no LE edema noted, no JVD, No carotid bruits, peripheral pulses palpable at 2+  Abdomen:  Positive hypoactive bowel sounds, Soft, Non tender, Non distended,  No masses appreciated, no obvious hepatosplenomegaly  Skin:  No Cyanosis, poor Skin Turgor, No Skin Rash or Bruise.  Extremities: Symmetrical without obvious trauma or injury, no effusions.  Data Review  CBC  Recent Labs Lab 03/25/15 1825 03/29/15 0428  WBC 6.1  --   HGB 10.9* 12.9*  HCT 33.7* 38.0*  PLT 415*  --   MCV 90.6  --   MCH 29.3  --   MCHC 32.3  --   RDW 14.8  --   LYMPHSABS 2.4  --   MONOABS 0.9  --   EOSABS 0.8*  --   BASOSABS 0.1  --     Chemistries   Recent Labs Lab 03/25/15 1825 03/29/15 0420 03/29/15 0428  NA 140 139 140  K 3.4* 3.7 3.7  CL 105 103 102  CO2 26 26  --   GLUCOSE 134* 163* 164*  BUN 16 11 13   CREATININE 0.97 0.88 0.80  CALCIUM 9.0 9.2  --     CrCl cannot be calculated (Unknown ideal weight.).  No results for input(s): TSH, T4TOTAL, T3FREE, THYROIDAB in the last 72 hours.  Invalid input(s): FREET3  Coagulation profile No results for input(s): INR, PROTIME in  the last 168 hours.  No results for input(s): DDIMER in the last 72 hours.  Cardiac Enzymes No results for input(s): CKMB, TROPONINI, MYOGLOBIN in the last 168 hours.  Invalid input(s): CK  Invalid input(s): POCBNP  Urinalysis    Component Value Date/Time   COLORURINE YELLOW 03/25/2015 1948   APPEARANCEUR CLEAR 03/25/2015 1948   LABSPEC 1.021 03/25/2015 1948   PHURINE 6.0 03/25/2015 1948   GLUCOSEU NEGATIVE 03/25/2015 1948   HGBUR NEGATIVE 03/25/2015 1948   BILIRUBINUR NEGATIVE 03/25/2015 1948   KETONESUR NEGATIVE 03/25/2015 1948   PROTEINUR NEGATIVE 03/25/2015 1948   UROBILINOGEN 0.2 03/25/2015 1948   NITRITE NEGATIVE 03/25/2015 1948   LEUKOCYTESUR NEGATIVE 03/25/2015 1948    Imaging results:   Dg Chest 2 View  03/25/2015   CLINICAL DATA:  Respiratory distress  EXAM: CHEST  2 VIEW  COMPARISON:  03/08/2015  FINDINGS: Cardiac shadow is mildly enlarged but stable. The left lung is clear. Improved aeration is noted in the right lung base when compare with the prior study. Diffuse interstitial changes are again noted stable from the prior study. No bony abnormality is seen.  IMPRESSION: Stable interstitial changes.  No acute infiltrate is seen.   Electronically Signed   By: Inez Catalina M.D.   On: 03/25/2015 18:57   Ct Head Wo Contrast  03/08/2015   CLINICAL DATA:  Pt not able to communicate unable to straighten headThis morning he was not easily arousable and so he was sent over to the hospital for evaluation. Patient is unable to provide any history due to his encephalopathy.  EXAM: CT HEAD WITHOUT CONTRAST  TECHNIQUE: Contiguous axial images were obtained from the base of the skull through the vertex without intravenous  contrast.  COMPARISON:  02/04/2015  FINDINGS: Ventricles normal configuration. There is ventricular and sulcal enlargement reflecting moderate atrophy. No hydrocephalus.  Smaller area of encephalomalacia in the left occipital lobe. There is hypoattenuation and small  areas of encephalomalacia in the left posterior frontal lobe adjacent parietal lobe. These findings are stable reflecting old infarcts.  No parenchymal masses or mass effect. There is no evidence of a recent transcortical infarct.  There are no extra-axial masses or abnormal fluid collections.  No intracranial hemorrhage.  Visualized sinuses and mastoid air cells are clear.  IMPRESSION: 1. No acute intracranial abnormalities. 2. No change from the prior exam.  Atrophy and old infarcts.   Electronically Signed   By: Lajean Manes M.D.   On: 03/08/2015 12:41   Ct Angio Chest Pe W/cm &/or Wo Cm  03/29/2015   CLINICAL DATA:  Congestion and cough.  Hypoxia.  EXAM: CT ANGIOGRAPHY CHEST WITH CONTRAST  TECHNIQUE: Multidetector CT imaging of the chest was performed using the standard protocol during bolus administration of intravenous contrast. Multiplanar CT image reconstructions and MIPs were obtained to evaluate the vascular anatomy.  CONTRAST:  26mL OMNIPAQUE IOHEXOL 350 MG/ML SOLN  COMPARISON:  01/21/2011  FINDINGS: THORACIC INLET/BODY WALL:  No acute abnormality.  MEDIASTINUM:  Normal heart size. No pericardial effusion. Extensive atherosclerosis, including the coronary arteries. Limited aortic opacification. No evidence of acute aortic syndrome. Subsegmental pulmonary arteries are intermittently distorted by respiratory motion, but the exam is overall diagnostic and negative for pulmonary embolism. No adenopathy.  LUNG WINDOWS:  Diffuse bronchial wall thickening and bronchial impaction. Reticular markings in the bilateral lungs is stable from 2012. There is however reticular nodular airspace disease in the right upper lobe. A 9 mm nodule in the right upper lobe on image 49 is stable from 2012.  UPPER ABDOMEN:  Presumed 3 cm cyst in the upper pole left kidney.  OSSEOUS:  T12 compression fracture with readily visible fracture plane. Height loss is approximately 50% and there is no retropulsion. Compression fractures  of T4, T5, T6, T9 appear chronic  Review of the MIP images confirms the above findings.  IMPRESSION: 1. Negative for pulmonary embolism. 2. Extensive bronchitis and mucoid impaction. There could be early bronchopneumonia in the right upper lobe (versus scarring which is seen diffusely). 3. Acute T12 compression fracture with moderate height loss.   Electronically Signed   By: Monte Fantasia M.D.   On: 03/29/2015 06:53   Dg Chest Portable 1 View  03/29/2015   CLINICAL DATA:  Cough and congestion.  Shortness of breath  EXAM: PORTABLE CHEST - 1 VIEW  COMPARISON:  03/25/2015  FINDINGS: Stable cardiomegaly and aortic tortuosity.  There is a background of hyperinflation and interstitial coarsening with streaky opacity. No definitive change when accounting for differences in technique. No edema, effusion, or pneumothorax. No acute osseous findings.  IMPRESSION: No change from 03/25/2015 to suggest acute disease. Note that the patient's chronic lung disease could obscure early infection.   Electronically Signed   By: Monte Fantasia M.D.   On: 03/29/2015 05:07   Dg Chest Portable 1 View  03/08/2015   CLINICAL DATA:  Code sepsis.Per ED note: Arrives from [facility] via EMS; sent for altered mental status starting yesterday. Usually alert and talks; resting with eyes closed and groans to stimulation. [facility] suspected UTI yesterday and gave Rocephin yesterdayH/o COPD, CHF, HTN, a-fib, asthma, dementia.  EXAM: PORTABLE CHEST - 1 VIEW  COMPARISON:  02/07/2015  FINDINGS: There is increased opacity at the  right lung base when compared the prior study with decreased opacity in the right upper lobe. There persistent thickened interstitial markings bilaterally. Cardiac silhouette remains mildly enlarged. No pneumothorax.  IMPRESSION: 1. Increased facets right lung base when compared the prior study. This may be due to atelectasis, pneumonia or a combination. 2. Improved opacity previously noted in the right upper lobe. 3.  Persistent mild thickening of the interstitial markings without overt pulmonary edema. 4. Mild persistent cardiomegaly.   Electronically Signed   By: Lajean Manes M.D.   On: 03/08/2015 09:53   Dg Swallowing Func-speech Pathology  03/10/2015    Objective Swallowing Evaluation:    Patient Details  Name: MANINDER DEBOER MRN: [id number] Date of Birth: [birth year]  Today's Date: 03/10/2015 Time: SLP Start Time (ACUTE ONLY): 0845-SLP Stop Time (ACUTE ONLY): 0910 SLP Time Calculation (min) (ACUTE ONLY): 25 min  Past Medical History:  Past Medical History  Diagnosis Date  . Asthma   . Hypertension   . A-fib   . Hyperlipidemia   . Shortness of breath   . CHF (congestive heart failure) 10/28/2011  . Forgetfulness   . PVC's (premature ventricular contractions)   . Mild aortic insufficiency   . Dementia     [name] 02/05/2015  . Failure to thrive in adult     /notes 02/05/2015  . COPD (chronic obstructive pulmonary disease)   . Hypothyroidism     [name] 02/05/2015  . Fall 02/04/2015    with altered mental status off of his baseline after he suffered a  mechanical fall /notes 02/04/2015  . CAP (community acquired pneumonia)     [name] 02/05/2015  . Colon cancer    Past Surgical History:  Past Surgical History  Procedure Laterality Date  . Colon surgery      Partial hemecolectomy   . Central line insertion  09/08/2011        HPI:  Other Pertinent Information: This is a 81 year old Caucasian male who was  recently hospitalized and treated for community-acquired pneumonia. He was  discharged to a skilled nursing facility. Comes in with altered mental  status, low-grade fever and perhaps a cough. Chest X-ray reveals possible  infiltrate in the right lower lung. Other possibilities for his altered  mental status include hypercapnia. Meningitis appears to be unlikely based  on examination and other data. Dehydration could also be contributing..   Patient has had previous swallow work up in 2012 with rx for nectar  liquids due to silent aspiration of  thins.  The patient reports that he  just stopped using thickened liquids.  No Data Recorded  Assessment / Plan / Recommendation CHL IP CLINICAL IMPRESSIONS 03/10/2015  Therapy Diagnosis (None)  Clinical Impression Pt presents with mild motor and sensory deficits  impacting safety with liquid textures. Pts oral phase is mildly impaired,  but functional. Primary deficits include delayed swallow with silent  penetration of thin and nectar thick liquids before and during the  swallow, impacted largely by bolus size and rate of intake. When taking  small single sips with head in neutral pt able to tolerate thin and nectar  sips without signficant aspiration. A cued throat clear and second swallow  would decrease risk of penetration and residue. Recommend pt consume thin  liquids and dys 3 (mechanical soft) diet with supervision for safe intake.  Again texture of liquids is not as important as bolus size/aspiration  precautions for this pt; supervision needed.       CHL IP TREATMENT RECOMMENDATION  03/10/2015  Treatment Recommendations Therapy as outlined in treatment plan below     CHL IP DIET RECOMMENDATION 03/10/2015  SLP Diet Recommendations Dysphagia 3 (Mech soft);Thin  Liquid Administration via (None)  Medication Administration Whole meds with puree  Compensations Clear throat intermittently;Slow rate;Small  sips/bites;Multiple dry swallows after each bite/sip  Postural Changes and/or Swallow Maneuvers (None)     CHL IP OTHER RECOMMENDATIONS 03/10/2015  Recommended Consults (None)  Oral Care Recommendations Oral care BID  Other Recommendations (None)     No flowsheet data found.   CHL IP FREQUENCY AND DURATION 03/10/2015  Speech Therapy Frequency (ACUTE ONLY) (None)  Treatment Duration 2 weeks     Pertinent Vitals/Pain NA    SLP Swallow Goals CHL IP SWALLOW STUDY GOALS 09/11/2011  Patient will consume recommended diet without observed clinical signs of  aspiration with Minimal assistance  Swallow Study Goal #1 - Progress  (None)  Patient will utilize recommended strategies during swallow to increase  swallowing safety with Minimal assistance  Swallow Study Goal #2 - Progress (None)  Goal #3 (None)  Swallow Study Goal #3 - Progress (None)  Goal #4 (None)  Swallow Study Goal #4 - Progress (None)    No flowsheet data found.    CHL IP REASON FOR REFERRAL 03/10/2015  Reason for Referral Objectively evaluate swallowing function     CHL IP ORAL PHASE 03/10/2015  Lips (None)  Tongue (None)  Mucous membranes (None)  Nutritional status (None)  Other (None)  Oxygen therapy (None)  Oral Phase Impaired  Oral - Pudding Teaspoon (None)  Oral - Pudding Cup (None)  Oral - Honey Teaspoon (None)  Oral - Honey Cup (None)  Oral - Honey Syringe (None)  Oral - Nectar Teaspoon (None)  Oral - Nectar Cup (None)  Oral - Nectar Straw (None)  Oral - Nectar Syringe (None)  Oral - Ice Chips (None)  Oral - Thin Teaspoon (None)  Oral - Thin Cup (None)  Oral - Thin Straw (None)  Oral - Thin Syringe (None)  Oral - Puree (None)  Oral - Mechanical Soft (None)  Oral - Regular (None)  Oral - Multi-consistency (None)  Oral - Pill (None)  Oral Phase - Comment (None)      CHL IP PHARYNGEAL PHASE 03/10/2015  Pharyngeal Phase Impaired  Pharyngeal - Pudding Teaspoon (None)  Penetration/Aspiration details (pudding teaspoon) (None)  Pharyngeal - Pudding Cup (None)  Penetration/Aspiration details (pudding cup) (None)  Pharyngeal - Honey Teaspoon (None)  Penetration/Aspiration details (honey teaspoon) (None)  Pharyngeal - Honey Cup (None)  Penetration/Aspiration details (honey cup) (None)  Pharyngeal - Honey Syringe (None)  Penetration/Aspiration details (honey syringe) (None)  Pharyngeal - Nectar Teaspoon (None)  Penetration/Aspiration details (nectar teaspoon) (None)  Pharyngeal - Nectar Cup (None)  Penetration/Aspiration details (nectar cup) (None)  Pharyngeal - Nectar Straw (None)  Penetration/Aspiration details (nectar straw) (None)  Pharyngeal - Nectar Syringe (None)   Penetration/Aspiration details (nectar syringe) (None)  Pharyngeal - Ice Chips (None)  Penetration/Aspiration details (ice chips) (None)  Pharyngeal - Thin Teaspoon (None)  Penetration/Aspiration details (thin teaspoon) (None)  Pharyngeal - Thin Cup (None)  Penetration/Aspiration details (thin cup) (None)  Pharyngeal - Thin Straw (None)  Penetration/Aspiration details (thin straw) (None)  Pharyngeal - Thin Syringe (None)  Penetration/Aspiration details (thin syringe') (None)  Pharyngeal - Puree (None)  Penetration/Aspiration details (puree) (None)  Pharyngeal - Mechanical Soft (None)  Penetration/Aspiration details (mechanical soft) (None)  Pharyngeal - Regular (None)  Penetration/Aspiration details (regular) (None)  Pharyngeal - Multi-consistency (None)  Penetration/Aspiration  details (multi-consistency) (None)  Pharyngeal - Pill (None)  Penetration/Aspiration details (pill) (None)  Pharyngeal Comment (None)      CHL IP CERVICAL ESOPHAGEAL PHASE 09/13/2011  Cervical Esophageal Phase (None)  Pudding Teaspoon (None)  Pudding Cup (None)  Honey Teaspoon (None)  Honey Cup (None)  Honey Straw (None)  Nectar Teaspoon (None)  Nectar Cup (None)  Nectar Straw (None)  Nectar Sippy Cup (None)  Thin Teaspoon (None)  Thin Cup (None)  Thin Straw (None)  Thin Sippy Cup (None)  Cervical Esophageal Comment Severe residue on UES post-swallow of  puree/solids    No flowsheet data found.  Herbie Baltimore, MA CCC-SLP [phone]        DeBlois, Katherene Ponto 03/10/2015, 10:39 AM      EKG: (Independently reviewed) atrial fibrillation, ventricular rate 96, QTC 545 ms, chronic subtle ST downsloping in inferior lateral leads unchanged from previous EKG   Assessment & Plan  Principal Problem:   Sepsis -Admit to SDU -Pulmonary source -Cycle Lactic acid x 2 more collections -FU blood and sputum cxs -empiric broad spectrum anbx's  Active Problems:   Acute respiratory failure with hypoxia:   A) HCAP    B) COPD    C) Chronic  diastolic CHF -suspect HCAP +/- component aspiration -cont Maxipime and Vanco -supportive care with nebs and oxygen -given mucoid plugs add chest PT and prn NT sxn -BiPAP  -no s/s COPD exacerbation so no steroids at this juncture but will provide Duoneb q 4 hrs -no s/s CHF exacerbation but observe closely with volume resuscitation    Chronic atrial fibrillation -rate controlled (not on rate controlling meds PTA) -cont Xarelto with sips of water      Hypothyroidism -convert to IV route    NSVT -ck Mg -keep K >/= 4.0- add KCL to fluids    HLD -hold statin while NPO    Protein-calorie malnutrition, severe -resume protein supplements once diet resumed -was on D3 diet PTA- given current debility will need to repeat SLP eval before allow diet    Anemia, iron deficiency -Hgb 12.9 with baseline 10.9- this reflects hemoconcentration/volume depletion    DVT Prophylaxis: Xarelto  Family Communication:   Grandson Andy Chrismon via phone  Code Status:  DO NOT RESUSCITATE  Condition:  Guarded  Discharge disposition: If survives hospitalization anticipate return to SNF  Time spent in minutes : 60      ELLIS,ALLISON L. ANP on 03/29/2015 at 7:50 AM  Between 7am to 7pm - Pager - [phone]  After 7pm go to [email or web address] - password [email or web address]  And look for the night coverage person covering me after hours  [facility]

## 2015-03-29 NOTE — ED Notes (Signed)
Respiratory called to deep suction patient.

## 2015-03-29 NOTE — ED Notes (Addendum)
During report from previous RN, pt noted to have 7 beat run of vtach, pt alert and pulses present. nad noted. Report printed and given to Dr. Randal Buba.

## 2015-03-29 NOTE — Progress Notes (Signed)
RT NT suctioned patient. Small white amount obtained. Patient tolerated well. Vital signs stable throughout. RN at bedside. RT will continue to monitor.

## 2015-03-29 NOTE — ED Notes (Signed)
Resp tech Nira Conn,  called for evaluation of patient and for continuous neb treatment verification.

## 2015-03-29 NOTE — Progress Notes (Signed)
RT obtained ABG and showed results to MD and RN. Pt ABG values all within normal range and is not in need of BiPAP at this time. RT discussed this with MD as well as NTS'ing pt with MD and was told to wait for more results, RT will continue to monitor.

## 2015-03-29 NOTE — ED Provider Notes (Signed)
CSN: [id number]     Arrival date & time 03/29/15  0405 History   First MD Initiated Contact with Patient 03/29/15 [id number]     Chief Complaint  Patient presents with  . Cough     (Consider location/radiation/quality/duration/timing/severity/associated sxs/prior Treatment) Patient is a 81 y.o. male presenting with cough. The history is provided by medical records. The history is limited by the condition of the patient (dementia ).  Cough Severity:  Moderate Onset quality:  Gradual Timing:  Constant Progression:  Unable to specify Chronicity:  Recurrent Context: not fumes   Relieved by:  Nothing Worsened by:  Nothing tried Ineffective treatments:  None tried Associated symptoms: no fever   Risk factors: no recent travel     Past Medical History  Diagnosis Date  . Asthma   . Hypertension   . A-fib   . Hyperlipidemia   . Shortness of breath   . CHF (congestive heart failure) 10/28/2011  . Forgetfulness   . PVC's (premature ventricular contractions)   . Mild aortic insufficiency   . Dementia     [name] 02/05/2015  . Failure to thrive in adult     /notes 02/05/2015  . COPD (chronic obstructive pulmonary disease)   . Hypothyroidism     [name] 02/05/2015  . Fall 02/04/2015    with altered mental status off of his baseline after he suffered a mechanical fall /notes 02/04/2015  . CAP (community acquired pneumonia)     [name] 02/05/2015  . Colon cancer    Past Surgical History  Procedure Laterality Date  . Colon surgery      Partial hemecolectomy   . Central line insertion  09/08/2011        Family History  Problem Relation Age of Onset  . Hyperlipidemia Father   . Hypertension Father   . Heart disease Father   . Asthma Father   . Stroke Father   . Heart failure Father   . Heart disease Mother   . Heart failure Mother   . Heart attack Sister    History  Substance Use Topics  . 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 Smokeless tobacco: Not on file     Comment: quit 30+ yrs ago  .  Alcohol Use: No    Review of Systems  Unable to perform ROS Constitutional: Negative for fever.  Respiratory: Positive for cough.       Allergies  Review of patient's allergies indicates no known allergies.  Home Medications   Prior to Admission medications   Medication Sig Start Date End Date Taking? Authorizing Provider  ADVAIR DISKUS 500-50 MCG/DOSE AEPB INHALE 1 PUFF TWICE A DAY 12/23/14  Yes Susy Frizzle, MD  feeding supplement, ENSURE ENLIVE, (ENSURE ENLIVE) LIQD Take 237 mLs by mouth 2 (two) times daily between meals. 03/11/15  Yes Geradine Girt, DO  ferrous sulfate 325 (65 FE) MG tablet TAKE 1 TABLET BY MOUTH EVERY DAY 12/23/14  Yes Susy Frizzle, MD  fluticasone [facility]) 50 MCG/ACT nasal spray Place 2 sprays into both nostrils daily.   Yes Historical Provider, MD  guaiFENesin 200 MG tablet Take 200 mg by mouth every 4 (four) hours as needed for cough or to loosen phlegm.   Yes Historical Provider, MD  ipratropium-albuterol (DUONEB) 0.5-2.5 (3) MG/3ML SOLN Take 3 mLs by nebulization every 6 (six) hours as needed (SOB/Wheezing).   Yes Historical Provider, MD  levothyroxine (SYNTHROID, LEVOTHROID) 25 MCG tablet TAKE 1 TABLET BY MOUTH EVERY DAY 03/27/15  Yes Cletus Gash  Avel Peace, MD  losartan (COZAAR) 50 MG tablet Take 50 mg by mouth daily. 01/23/15  Yes Historical Provider, MD  omeprazole (PRILOSEC) 40 MG capsule Take 40 mg by mouth daily.   Yes Historical Provider, MD  pravastatin (PRAVACHOL) 20 MG tablet Take 20 mg by mouth daily.   Yes Historical Provider, MD  rivaroxaban (XARELTO) 20 MG TABS tablet Take 1 tablet (20 mg total) by mouth daily with supper. 03/11/15  Yes Geradine Girt, DO  venlafaxine XR (EFFEXOR XR) 37.5 MG 24 hr capsule Take 2 capsules (75 mg total) by mouth daily with breakfast. 2 pills in AM 03/11/15  Yes Jessica U Vann, DO  ipratropium (ATROVENT) 0.02 % nebulizer solution Take 2.5 mLs (0.5 mg total) by nebulization every 4 (four) hours as needed for wheezing  or shortness of breath. Patient not taking: Reported on 03/29/2015 03/11/15   Geradine Girt, DO  levalbuterol (XOPENEX) 0.63 MG/3ML nebulizer solution Take 3 mLs (0.63 mg total) by nebulization every 4 (four) hours as needed for wheezing or shortness of breath. Patient not taking: Reported on 03/29/2015 03/11/15   Geradine Girt, DO  tamsulosin (FLOMAX) 0.4 MG CAPS capsule Take 1 capsule (0.4 mg total) by mouth daily. Patient not taking: Reported on 03/29/2015 03/11/15   Tomi Bamberger Vann, DO   BP 143/83 mmHg  Pulse 93  Temp(Src) 98.3 F (36.8 C) (Oral)  Resp 29  SpO2 98% Physical Exam  Constitutional: He appears well-developed and well-nourished.  HENT:  Head: Normocephalic and atraumatic.  Mouth/Throat: No oropharyngeal exudate.  Eyes: Conjunctivae are normal. Pupils are equal, round, and reactive to light.  Neck: Normal range of motion. Neck supple.  Cardiovascular: Normal rate.  An irregularly irregular rhythm present.  Pulmonary/Chest: No stridor. No respiratory distress. He has wheezes. He has no rales. He exhibits no tenderness.  diminished on first exam now wheezing  Abdominal: Soft. Bowel sounds are normal. There is no tenderness. There is no rebound and no guarding.  Musculoskeletal: Normal range of motion. He exhibits no edema.  Neurological: He is alert. He has normal reflexes.  Skin: Skin is warm and dry. He is not diaphoretic.  Psychiatric:  unable    ED Course  Procedures (including critical care time) Labs Review Labs Reviewed  BASIC METABOLIC PANEL - Abnormal; Notable for the following:    Glucose, Bld 163 (*)    All other components within normal limits  BRAIN NATRIURETIC PEPTIDE - Abnormal; Notable for the following:    B Natriuretic Peptide 327.5 (*)    All other components within normal limits  I-STAT CHEM 8, ED - Abnormal; Notable for the following:    Glucose, Bld 164 (*)    Hemoglobin 12.9 (*)    HCT 38.0 (*)    All other components within normal limits   I-STAT CG4 LACTIC ACID, ED - Abnormal; Notable for the following:    Lactic Acid, Venous 2.03 (*)    All other components within normal limits  I-STAT ARTERIAL BLOOD GAS, ED - Abnormal; Notable for the following:    pO2, Arterial 70.0 (*)    Bicarbonate 24.6 (*)    All other components within normal limits  CULTURE, BLOOD (ROUTINE X 2)  CULTURE, BLOOD (ROUTINE X 2)  CBC  BLOOD GAS, ARTERIAL  I-STAT TROPOININ, ED  I-STAT TROPOININ, ED    Imaging Review Dg Chest Portable 1 View  03/29/2015   CLINICAL DATA:  Cough and congestion.  Shortness of breath  EXAM: PORTABLE CHEST -  1 VIEW  COMPARISON:  03/25/2015  FINDINGS: Stable cardiomegaly and aortic tortuosity.  There is a background of hyperinflation and interstitial coarsening with streaky opacity. No definitive change when accounting for differences in technique. No edema, effusion, or pneumothorax. No acute osseous findings.  IMPRESSION: No change from 03/25/2015 to suggest acute disease. Note that the patient's chronic lung disease could obscure early infection.   Electronically Signed   By: Monte Fantasia M.D.   On: 03/29/2015 05:07     EKG Interpretation   Date/Time:  Saturday Mar 29 2015 04:43:06 EDT Ventricular Rate:  96 PR Interval:    QRS Duration: 94 QT Interval:  431 QTC Calculation: 545 R Axis:   -56 Text Interpretation:  Atrial fibrillation Ventricular premature complex  Left anterior fascicular block Anterior infarct, old Minimal ST  depression, diffuse leads Prolonged QT interval Confirmed by [facility]   MD, Jhoselin Crume ([id number]) on 03/29/2015 4:45:29 AM      MDM   Final diagnoses:  None    Results for orders placed or performed during the hospital encounter of 97/98/92  Basic metabolic panel  (if pt has PMH of COPD)  Result Value Ref Range   Sodium 139 135 - 145 mmol/L   Potassium 3.7 3.5 - 5.1 mmol/L   Chloride 103 101 - 111 mmol/L   CO2 26 22 - 32 mmol/L   Glucose, Bld 163 (H) 65 - 99 mg/dL   BUN 11 6 -  20 mg/dL   Creatinine, Ser 0.88 0.61 - 1.24 mg/dL   Calcium 9.2 8.9 - 10.3 mg/dL   GFR calc non Af Amer >60 >60 mL/min   GFR calc Af Amer >60 >60 mL/min   Anion gap 10 5 - 15  Brain natriuretic peptide  Result Value Ref Range   B Natriuretic Peptide 327.5 (H) 0.0 - 100.0 pg/mL  I-stat troponin, ED  (if patient has PMH of COPD)  not at [facility], [facility]  Result Value Ref Range   Troponin i, poc 0.01 0.00 - 0.08 ng/mL   Comment 3          I-stat chem 8, ed  Result Value Ref Range   Sodium 140 135 - 145 mmol/L   Potassium 3.7 3.5 - 5.1 mmol/L   Chloride 102 101 - 111 mmol/L   BUN 13 6 - 20 mg/dL   Creatinine, Ser 0.80 0.61 - 1.24 mg/dL   Glucose, Bld 164 (H) 65 - 99 mg/dL   Calcium, Ion 1.21 1.13 - 1.30 mmol/L   TCO2 22 0 - 100 mmol/L   Hemoglobin 12.9 (L) 13.0 - 17.0 g/dL   HCT 38.0 (L) 39.0 - 52.0 %  I-Stat CG4 Lactic Acid, ED  Result Value Ref Range   Lactic Acid, Venous 2.03 (HH) 0.5 - 2.0 mmol/L   Comment NOTIFIED PHYSICIAN   I-Stat arterial blood gas, ED  Result Value Ref Range   pH, Arterial 7.404 7.350 - 7.450   pCO2 arterial 39.3 35.0 - 45.0 mmHg   pO2, Arterial 70.0 (L) 80.0 - 100.0 mmHg   Bicarbonate 24.6 (H) 20.0 - 24.0 mEq/L   TCO2 26 0 - 100 mmol/L   O2 Saturation 94.0 %   Patient temperature 98.3 F    Collection site RADIAL, ALLEN'S TEST ACCEPTABLE    Drawn by Operator    Sample type ARTERIAL    Medications  albuterol (PROVENTIL) (2.5 MG/3ML) 0.083% nebulizer solution 10 mg (not administered)  ipratropium (ATROVENT) nebulizer solution 2 mg (not administered)  vancomycin (VANCOCIN) IVPB  1000 mg/200 mL premix (not administered)  piperacillin-tazobactam (ZOSYN) IVPB 3.375 g (not administered)  iohexol (OMNIPAQUE) 350 MG/ML injection 90 mL (90 mLs Intravenous Contrast Given 03/29/15 0609)   Will admit to medicine   Ritter Helsley, MD 03/29/15 [phone]

## 2015-03-30 ENCOUNTER — Inpatient Hospital Stay (HOSPITAL_COMMUNITY): Payer: Medicare Other

## 2015-03-30 DIAGNOSIS — E038 Other specified hypothyroidism: Secondary | ICD-10-CM

## 2015-03-30 DIAGNOSIS — E034 Atrophy of thyroid (acquired): Secondary | ICD-10-CM

## 2015-03-30 LAB — CBC
HEMATOCRIT: 34.5 % — AB (ref 39.0–52.0)
Hemoglobin: 11.3 g/dL — ABNORMAL LOW (ref 13.0–17.0)
MCH: 29.4 pg (ref 26.0–34.0)
MCHC: 32.8 g/dL (ref 30.0–36.0)
MCV: 89.6 fL (ref 78.0–100.0)
Platelets: 383 10*3/uL (ref 150–400)
RBC: 3.85 MIL/uL — AB (ref 4.22–5.81)
RDW: 15 % (ref 11.5–15.5)
WBC: 3.3 10*3/uL — ABNORMAL LOW (ref 4.0–10.5)

## 2015-03-30 LAB — COMPREHENSIVE METABOLIC PANEL
ALBUMIN: 3.2 g/dL — AB (ref 3.5–5.0)
ALT: 11 U/L — ABNORMAL LOW (ref 17–63)
AST: 18 U/L (ref 15–41)
Alkaline Phosphatase: 98 U/L (ref 38–126)
Anion gap: 9 (ref 5–15)
BUN: 16 mg/dL (ref 6–20)
CALCIUM: 9.6 mg/dL (ref 8.9–10.3)
CHLORIDE: 109 mmol/L (ref 101–111)
CO2: 24 mmol/L (ref 22–32)
CREATININE: 0.98 mg/dL (ref 0.61–1.24)
GFR calc non Af Amer: >60 mL/min (ref >60)
GLUCOSE: 218 mg/dL — AB (ref 65–99)
Potassium: 3.7 mmol/L (ref 3.5–5.1)
Sodium: 142 mmol/L (ref 135–145)
TOTAL PROTEIN: 7 g/dL (ref 6.5–8.1)
Total Bilirubin: 0.5 mg/dL (ref 0.3–1.2)

## 2015-03-30 MED ORDER — LEVALBUTEROL HCL 0.63 MG/3ML IN NEBU
0.6300 mg | INHALATION_SOLUTION | RESPIRATORY_TRACT | Status: DC | PRN
  Administered 2015-03-31 – 2015-04-01 (×2): 0.63 mg via RESPIRATORY_TRACT
  Filled 2015-03-30 (×2): qty 3

## 2015-03-30 MED ORDER — LEVALBUTEROL HCL 0.63 MG/3ML IN NEBU: 0.6300 mg | INHALATION_SOLUTION | RESPIRATORY_TRACT | Status: DC | PRN

## 2015-03-30 MED ORDER — IPRATROPIUM BROMIDE 0.02 % IN SOLN: 0.5000 mg | RESPIRATORY_TRACT | Status: DC | PRN

## 2015-03-30 MED ORDER — IPRATROPIUM-ALBUTEROL 0.5-2.5 (3) MG/3ML IN SOLN: 3.0000 mL | Freq: Four times a day (QID) | RESPIRATORY_TRACT | Status: DC

## 2015-03-30 MED ORDER — IPRATROPIUM-ALBUTEROL 0.5-2.5 (3) MG/3ML IN SOLN: 3.0000 mL | RESPIRATORY_TRACT | Status: DC | PRN

## 2015-03-30 MED ORDER — IPRATROPIUM BROMIDE 0.02 % IN SOLN
0.5000 mg | Freq: Four times a day (QID) | RESPIRATORY_TRACT | Status: DC
  Administered 2015-03-30 – 2015-03-31 (×3): 0.5 mg via RESPIRATORY_TRACT
  Filled 2015-03-30 (×3): qty 2.5

## 2015-03-30 MED ORDER — METOPROLOL TARTRATE 1 MG/ML IV SOLN
5.0000 mg | Freq: Four times a day (QID) | INTRAVENOUS | Status: AC
  Administered 2015-03-30 – 2015-03-31 (×5): 5 mg via INTRAVENOUS
  Filled 2015-03-30 (×7): qty 5

## 2015-03-30 MED ORDER — LEVALBUTEROL HCL 0.63 MG/3ML IN NEBU
0.6300 mg | INHALATION_SOLUTION | Freq: Four times a day (QID) | RESPIRATORY_TRACT | Status: DC
  Administered 2015-03-30 – 2015-03-31 (×3): 0.63 mg via RESPIRATORY_TRACT
  Filled 2015-03-30 (×6): qty 3

## 2015-03-30 NOTE — Progress Notes (Signed)
[place] TEAM 1 - Stepdown/ICU TEAM Progress Note  Todd [name]:[id number] DOB: 04/02/1924 DOA: 03/29/2015 PCP: Odette Fraction, MD  Admit HPI / Brief Narrative: 81 yo male discharged to SNF for rehab stay on 5/10 after admit for COPD exacerbation and CAP.  History of chronic A. fib on Xarelto, hypertension, iron deficiency anemia, hypothyroidism, chronic DHF, and history biliary pancreatitis status post cholecystectomy. Since discharge PNA sxs apparently resolved but patient never returned to his former baseline w/ poor oral intake. Sent from SNF due to cough with congestion and respiratory distress. Per EMS hemodynamically stable but O2 sats 95% on 6L.  Observed with increased work of breathing. Upon arrival to ER patient with continued increased work of breathing and tachypnea. ABG with PO2 70 on 6L. given nebs with some improvement but not resolution of symptoms. CT chest revealed extensive bronchitis and mucoid impaction and likely bronchopneumonia right upper lobe as well as acute T12 comp fx. Patient is DO NOT RESUSCITATE. WBC 9,900, Hg 11.2, initial lactic acid 2.03 but decr to 1.10 after treatment and fluids, electrolyte panel normal except for glucose 164, K+ 3.7  HPI/Subjective: At the time of my visit the patient is awake but not interactive.  He follows the examiner with his eyes but does not answer questions.  He does not appear to be uncomfortable.  He cannot provide a reliable review of systems.  Assessment/Plan:  Sepsis due to RUL HCAP versus aspiration pneumonia With normal white blood cell count and procalcitonin less than 0.1 suspect this represents aspiration pneumonitis rather than active infection - continue empiric antibiotics for now and follow procalcitonin trend - observe strict aspiration precautions and follow SLP recommendations for diet  Acute hypoxic respiratory failure due to HCAP versus aspiration pneumonia CT chest angiogram negative for PE - oxygen  requirements have improved rapidly arguing pneumonitis rather than pneumonia - follow  COPD Well compensated at present with no wheezing  Chronic diastolic congestive heart failure No evidence of significant volume overload - follow I's and O's and daily weights  Chronic atrial fibrillation On chronic Xarelto anticoagulation - rate poorly controlled at present - adjust medical therapy and follow on telemetry  Hypothyroidism Continue usual medical therapy and check TSH  Hyperlipidemia Continue usual medical therapy  Severe protein calorie malnutrition Follow intake with advancement of diet  Chronic anemia Hemoglobin stable at present  Acute T12 compression fx Noted on CT chest - ?recent trauma   Code Status: NO CODE BLUE Family Communication: no family present at time of exam Disposition Plan: SDU  Consultants: none  Procedures: none  Antibiotics: Cefepime 5/28 > Vanc 5/28 > Zosyn 5/28  DVT prophylaxis: Xarelto  Objective: Blood pressure 126/73, pulse 93, temperature 97.8 F (36.6 C), temperature source Oral, resp. rate 26, weight 66.3 kg (146 lb 2.6 oz), SpO2 92 %.  Intake/Output Summary (Last 24 hours) at 03/30/15 1533 Last data filed at 03/30/15 1500  Gross per 24 hour  Intake   2170 ml  Output      0 ml  Net   2170 ml   Exam: General: No acute respiratory distress - alert but nonconversant Lungs: Coarse crackles diffusely consistent with transmitted upper airway sounds with no wheezing Cardiovascular: Irregularly irregular with heart rate approximately 110 bpm without appreciable murmur Abdomen: Nontender, nondistended, soft, bowel sounds positive, no rebound, no ascites, no appreciable mass Extremities: No significant cyanosis, clubbing, or edema bilateral lower extremities  Data Reviewed: Basic Metabolic Panel:  Recent Labs Lab 03/25/15 1825 03/29/15  5277 03/29/15 0428 03/29/15 0737 03/30/15 0350  NA 140 139 140 138 142  K 3.4* 3.7 3.7 3.6  3.7  CL 105 103 102 101 109  CO2 26 26  --  21* 24  GLUCOSE 134* 163* 164* 135* 218*  BUN 16 11 13 12 16   CREATININE 0.97 0.88 0.80 0.92 0.98  CALCIUM 9.0 9.2  --  8.8* 9.6  MG  --   --   --  1.9  --     CBC:  Recent Labs Lab 03/25/15 1825 03/29/15 0428 03/29/15 0737 03/30/15 0350  WBC 6.1  --  9.9 3.3*  NEUTROABS 2.0  --  4.4  --   HGB 10.9* 12.9* 11.2* 11.3*  HCT 33.7* 38.0* 34.2* 34.5*  MCV 90.6  --  90.0 89.6  PLT 415*  --  365 383    Liver Function Tests:  Recent Labs Lab 03/29/15 0737 03/30/15 0350  AST 18 18  ALT 10* 11*  ALKPHOS 106 98  BILITOT 0.4 0.5  PROT 6.7 7.0  ALBUMIN 3.3* 3.2*   Coags:  Recent Labs Lab 03/29/15 0737  INR 1.81*    Recent Labs Lab 03/29/15 0737  APTT 43*    Recent Results (from the past 240 hour(s))  Urine culture     Status: None   Collection Time: 03/25/15  7:48 PM  Result Value Ref Range Status   Specimen Description URINE, CLEAN CATCH  Final   Special Requests NONE  Final   Colony Count NO GROWTH Performed at [facility]   Final   Culture NO GROWTH Performed at [facility]   Final   Report Status 03/27/2015 FINAL  Final  Blood culture (routine x 2)     Status: None (Preliminary result)   Collection Time: 03/29/15  4:15 AM  Result Value Ref Range Status   Specimen Description BLOOD LEFT ARM  Final   Special Requests BOTTLES DRAWN AEROBIC AND ANAEROBIC 10CC  Final   Culture   Final    GRAM POSITIVE COCCI IN CLUSTERS Note: Gram Stain Report Called to,Read Back By and Verified With: Judie Bonus RN on 03/30/15 at 02:35 by Rise Mu Performed at [facility]    Report Status PENDING  Incomplete  Blood culture (routine x 2)     Status: None (Preliminary result)   Collection Time: 03/29/15  4:20 AM  Result Value Ref Range Status   Specimen Description BLOOD RIGHT ARM  Final   Special Requests BOTTLES DRAWN AEROBIC ONLY 10CC  Final   Culture   Final           BLOOD CULTURE RECEIVED  NO GROWTH TO DATE CULTURE WILL BE HELD FOR 5 DAYS BEFORE ISSUING A FINAL NEGATIVE REPORT Performed at [facility]    Report Status PENDING  Incomplete  MRSA PCR Screening     Status: None   Collection Time: 03/29/15  9:15 AM  Result Value Ref Range Status   MRSA by PCR NEGATIVE NEGATIVE Final    Comment:        The GeneXpert MRSA Assay (FDA approved for NASAL specimens only), is one component of a comprehensive MRSA colonization surveillance program. It is not intended to diagnose MRSA infection nor to guide or monitor treatment for MRSA infections.      Studies:   Recent x-ray studies have been reviewed in detail by the Attending Physician  Scheduled Meds:  Scheduled Meds: . ceFEPime (MAXIPIME) IV  2 g Intravenous Q24H  .  folic acid  1 mg Intravenous Daily  . guaiFENesin  1,200 mg Oral BID  . ipratropium-albuterol  3 mL Nebulization Q4H  . levothyroxine  12.5 mcg Intravenous QAC breakfast  . methylPREDNISolone (SOLU-MEDROL) injection  80 mg Intravenous 3 times per day  . pantoprazole  40 mg Oral BID  . rivaroxaban  20 mg Oral Q supper  . thiamine  100 mg Intravenous Daily  . vancomycin  500 mg Intravenous Q12H    Time spent on care of this patient: 35 mins   Collette Pescador T , MD   [facility] Office  [phone] Pager - Text Page per Shea Evans as per below:  On-Call/Text Page:      [email or web address]      password [email or web address]  If 7PM-7AM, please contact night-coverage [email or web address] Password [facility] 03/30/2015, 3:33 PM   LOS: 1 day

## 2015-03-30 NOTE — Progress Notes (Signed)
CRITICAL VALUE ALERT  Critical value received:  Positive blood cultures:  Anerobic, gram pos cocci in cluster  Date of notification:  03/30/15  Time of notification:  0240  Critical value read back: yes  Nurse who received alert:  Remo Lipps, RN  MD notified (1st page):  Rogue Bussing  Time of first page: 0245  Responding MD:  Rogue Bussing  Time MD responded:  [phone]

## 2015-03-31 ENCOUNTER — Other Ambulatory Visit (HOSPITAL_COMMUNITY): Payer: Medicare Other

## 2015-03-31 ENCOUNTER — Inpatient Hospital Stay (HOSPITAL_COMMUNITY): Payer: Medicare Other

## 2015-03-31 DIAGNOSIS — I5032 Chronic diastolic (congestive) heart failure: Secondary | ICD-10-CM

## 2015-03-31 DIAGNOSIS — J449 Chronic obstructive pulmonary disease, unspecified: Secondary | ICD-10-CM

## 2015-03-31 LAB — MAGNESIUM: MAGNESIUM: 2.1 mg/dL (ref 1.7–2.4)

## 2015-03-31 LAB — CULTURE, BLOOD (ROUTINE X 2)

## 2015-03-31 LAB — COMPREHENSIVE METABOLIC PANEL
ALT: 12 U/L — ABNORMAL LOW (ref 17–63)
ANION GAP: 9 (ref 5–15)
AST: 35 U/L (ref 15–41)
Albumin: 3.1 g/dL — ABNORMAL LOW (ref 3.5–5.0)
Alkaline Phosphatase: 89 U/L (ref 38–126)
BILIRUBIN TOTAL: 1.6 mg/dL — AB (ref 0.3–1.2)
BUN: 31 mg/dL — ABNORMAL HIGH (ref 6–20)
CO2: 23 mmol/L (ref 22–32)
Calcium: 9.6 mg/dL (ref 8.9–10.3)
Chloride: 110 mmol/L (ref 101–111)
Creatinine, Ser: 1.09 mg/dL (ref 0.61–1.24)
GFR calc Af Amer: >60 mL/min (ref >60)
GFR calc non Af Amer: 59 mL/min — ABNORMAL LOW (ref >60)
Glucose, Bld: 144 mg/dL — ABNORMAL HIGH (ref 65–99)
POTASSIUM: 5.1 mmol/L (ref 3.5–5.1)
Sodium: 142 mmol/L (ref 135–145)
Total Protein: 6.5 g/dL (ref 6.5–8.1)

## 2015-03-31 LAB — CBC
HCT: 32.5 % — ABNORMAL LOW (ref 39.0–52.0)
HEMOGLOBIN: 10.6 g/dL — AB (ref 13.0–17.0)
MCH: 29.4 pg (ref 26.0–34.0)
MCHC: 32.6 g/dL (ref 30.0–36.0)
MCV: 90 fL (ref 78.0–100.0)
Platelets: 394 10*3/uL (ref 150–400)
RBC: 3.61 MIL/uL — ABNORMAL LOW (ref 4.22–5.81)
RDW: 15.1 % (ref 11.5–15.5)
WBC: 19.3 10*3/uL — ABNORMAL HIGH (ref 4.0–10.5)

## 2015-03-31 MED ORDER — PANTOPRAZOLE SODIUM 40 MG PO TBEC
40.0000 mg | DELAYED_RELEASE_TABLET | Freq: Every day | ORAL | Status: DC
  Administered 2015-04-01 – 2015-04-05 (×5): 40 mg via ORAL
  Filled 2015-03-31 (×4): qty 1

## 2015-03-31 MED ORDER — ENSURE ENLIVE PO LIQD
237.0000 mL | Freq: Two times a day (BID) | ORAL | Status: DC
  Administered 2015-03-31 – 2015-04-05 (×10): 237 mL via ORAL

## 2015-03-31 MED ORDER — RIVAROXABAN 15 MG PO TABS
15.0000 mg | ORAL_TABLET | Freq: Every day | ORAL | Status: DC
  Administered 2015-03-31 – 2015-04-04 (×5): 15 mg via ORAL
  Filled 2015-03-31 (×6): qty 1

## 2015-03-31 MED ORDER — MOMETASONE FURO-FORMOTEROL FUM 200-5 MCG/ACT IN AERO
2.0000 | INHALATION_SPRAY | Freq: Two times a day (BID) | RESPIRATORY_TRACT | Status: DC
  Administered 2015-03-31 – 2015-04-05 (×8): 2 via RESPIRATORY_TRACT
  Filled 2015-03-31 (×3): qty 8.8

## 2015-03-31 MED ORDER — METOPROLOL TARTRATE 25 MG PO TABS
25.0000 mg | ORAL_TABLET | Freq: Two times a day (BID) | ORAL | Status: DC
Start: 2015-04-01 — End: 2015-04-05
  Administered 2015-04-01 – 2015-04-04 (×6): 25 mg via ORAL
  Filled 2015-03-31 (×10): qty 1

## 2015-03-31 MED ORDER — LEVALBUTEROL HCL 0.63 MG/3ML IN NEBU
0.6300 mg | INHALATION_SOLUTION | Freq: Four times a day (QID) | RESPIRATORY_TRACT | Status: DC
  Administered 2015-03-31: 0.63 mg via RESPIRATORY_TRACT
  Filled 2015-03-31 (×6): qty 3

## 2015-03-31 MED ORDER — RIVAROXABAN 15 MG PO TABS
15.0000 mg | ORAL_TABLET | Freq: Every day | ORAL | Status: DC
  Filled 2015-03-31: qty 1

## 2015-03-31 MED ORDER — PRAVASTATIN SODIUM 20 MG PO TABS
20.0000 mg | ORAL_TABLET | Freq: Every day | ORAL | Status: DC
  Administered 2015-03-31 – 2015-04-05 (×6): 20 mg via ORAL
  Filled 2015-03-31 (×6): qty 1

## 2015-03-31 NOTE — Progress Notes (Signed)
Transfer report received from [facility] at 1547 and pt arrived to the unit via bed with his belongings to the side at 1630. Pt Alert and verbally responsive; not pressure ulcer noted upon arrival to the noted; VSS and telemetry applied and verified; pt oriented to the unit and room; pt said his eye glasses was missing from [place] upon transfer and pt belongings checked and glasses noted to be missing and not at pt bedside or in his belongings. Bilateral feet edema noted +1; pt denies any SOB or respiratory distress when assessed but pt remains on 2L oxygen which he arrived to the unit on. Will closely monitor pt. Francis Gaines Paislynn Hegstrom RN.

## 2015-03-31 NOTE — Progress Notes (Signed)
Report called to nurse

## 2015-03-31 NOTE — Care Management Note (Signed)
Case Management Note  Patient Details  Name: HUEL CENTOLA MRN: [id number] Date of Birth: 31-Aug-1924  Subjective/Objective:       Adm w sepsis             Action/Plan: from nsg facility, sw ref made   Expected Discharge Date:                  Expected Discharge Plan:  [place]  In-House Referral:  Clinical Social Work  Discharge planning Services     Post Acute Care Choice:    Choice offered to:     DME Arranged:    DME Agency:     HH Arranged:    [place] Agency:     Status of Service:     Medicare Important Message Given:    Date Medicare IM Given:    Medicare IM give by:    Date Additional Medicare IM Given:    Additional Medicare Important Message give by:     If discussed at [place] of Stay Meetings, dates discussed:    Additional Comments: ur review done  Lacretia Leigh, RN 03/31/2015, 11:03 AM

## 2015-03-31 NOTE — Progress Notes (Signed)
SLP Cancellation Note  Patient Details Name: Todd [name] MRN: [id number] DOB: 06-Apr-1924   Cancelled treatment:       Reason Eval/Treat Not Completed: Other (comment). Pt just finished lunch, refused further POs at this time.   Lyanne Kates, Katherene Ponto 03/31/2015, 2:13 PM

## 2015-03-31 NOTE — Progress Notes (Signed)
Pt transferred to 2W14. Daughter notified of pts move.

## 2015-03-31 NOTE — Progress Notes (Signed)
[place] TEAM 1 - Stepdown/ICU TEAM Progress Note  Todd [name] XVQ:[id number] DOB: 1924/07/06 DOA: 03/29/2015 PCP: Odette Fraction, MD  Admit HPI / Brief Narrative: 81 yo male discharged to SNF for rehab stay on 5/10 after admit for COPD exacerbation and CAP.  History of chronic A. fib on Xarelto, hypertension, iron deficiency anemia, hypothyroidism, chronic DHF, and biliary pancreatitis status post cholecystectomy. Since discharge PNA sxs apparently resolved but patient never returned to his former baseline w/ poor oral intake. Sent from SNF due to cough with congestion and respiratory distress. Per EMS hemodynamically stable but O2 sats 95% on 6L.  Observed with increased work of breathing. Upon arrival to ER patient with continued increased work of breathing and tachypnea. ABG with PO2 70 on 6L. given nebs with some improvement but not resolution of symptoms. CT chest revealed extensive bronchitis and mucoid impaction and likely bronchopneumonia right upper lobe as well as acute T12 comp fx. Patient is DO NOT RESUSCITATE. WBC 9,900, Hg 11.2, initial lactic acid 2.03 but decr to 1.10 after treatment and fluids, electrolyte panel normal except for glucose 164, K+ 3.7  HPI/Subjective: [name] looks like a new patient today.  He is alert, interactive, and quite pleasant.  He denies any complaints whatsoever.  He does not appear to be in any distress.  Assessment/Plan:  Sepsis due to RUL HCAP versus aspiration pneumonia With normal white blood cell count and procalcitonin less than 0.1 suspect this represents aspiration pneumonitis rather than active infection - continue empiric antibiotics for now and follow procalcitonin trend with plan to discontinue antibiotics if next procalcitonin less than 0.10 - observe strict aspiration precautions and follow SLP recommendations for diet  Acute hypoxic respiratory failure due to HCAP versus aspiration pneumonia CT chest angiogram negative for PE -  oxygen requirements have improved rapidly arguing pneumonitis rather than pneumonia - appears to be stabilizing nicely - wean to room air as able  Coag neg staph + blood cx 1/2 blood cx - most c/w contaminant - follow for clinical s/sx of bacteremia once pt off abx   COPD Well compensated at present with no wheezing  Chronic diastolic congestive heart failure No evidence of significant volume overload - stop IV fluid - daily weight stable at 66.5 kg   Chronic atrial fibrillation On chronic Xarelto anticoagulation - rate now well controlled -  follow on telemetry  Hypothyroidism Continue usual medical therapy and check TSH  Hyperlipidemia Continue usual medical therapy  Severe protein calorie malnutrition Follow intake with advancement of diet  Chronic anemia Hemoglobin stable at present  Acute T12 compression fx Noted on CT chest - ?recent trauma - patient is asymptomatic - follow clinically  Code Status: NO CODE BLUE Family Communication: no family present at time of exam Disposition Plan: Stable for transfer to telemetry bed - PT OT consultations - ambulate - wean to room air - possible discharge back to skilled nursing facility an approximate 48 hours if remains stable  Consultants: none  Procedures: none  Antibiotics: Cefepime 5/28 > Vanc 5/28 > Zosyn 5/28  DVT prophylaxis: Xarelto  Objective: Blood pressure 148/88, pulse 71, temperature 98.2 F (36.8 C), temperature source Oral, resp. rate 25, height 5\' 7"  (1.702 m), weight 66.5 kg (146 lb 9.7 oz), SpO2 93 %.  Intake/Output Summary (Last 24 hours) at 03/31/15 1348 Last data filed at 03/31/15 1200  Gross per 24 hour  Intake    800 ml  Output      0 ml  Net    800 ml   Exam: General: No acute respiratory distress - alert and conversant  Lungs: Clear to auscultation diffusely with no wheeze Cardiovascular: Regular rate without murmur gallop or rub Abdomen: Nontender, nondistended, soft, bowel sounds  positive, no rebound, no ascites, no appreciable mass Extremities: No significant cyanosis, clubbing, edema bilateral lower extremities  Data Reviewed: Basic Metabolic Panel:  Recent Labs Lab 03/25/15 1825 03/29/15 0420 03/29/15 0428 03/29/15 0737 03/30/15 0350 03/31/15 0305  NA 140 139 140 138 142 142  K 3.4* 3.7 3.7 3.6 3.7 5.1  CL 105 103 102 101 109 110  CO2 26 26  --  21* 24 23  GLUCOSE 134* 163* 164* 135* 218* 144*  BUN 16 11 13 12 16  31*  CREATININE 0.97 0.88 0.80 0.92 0.98 1.09  CALCIUM 9.0 9.2  --  8.8* 9.6 9.6  MG  --   --   --  1.9  --  2.1    CBC:  Recent Labs Lab 03/25/15 1825 03/29/15 0428 03/29/15 0737 03/30/15 0350 03/31/15 0305  WBC 6.1  --  9.9 3.3* 19.3*  NEUTROABS 2.0  --  4.4  --   --   HGB 10.9* 12.9* 11.2* 11.3* 10.6*  HCT 33.7* 38.0* 34.2* 34.5* 32.5*  MCV 90.6  --  90.0 89.6 90.0  PLT 415*  --  365 383 394    Liver Function Tests:  Recent Labs Lab 03/29/15 0737 03/30/15 0350 03/31/15 0305  AST 18 18 35  ALT 10* 11* 12*  ALKPHOS 106 98 89  BILITOT 0.4 0.5 1.6*  PROT 6.7 7.0 6.5  ALBUMIN 3.3* 3.2* 3.1*   Coags:  Recent Labs Lab 03/29/15 0737  INR 1.81*    Recent Labs Lab 03/29/15 0737  APTT 43*    Recent Results (from the past 240 hour(s))  Urine culture     Status: None   Collection Time: 03/25/15  7:48 PM  Result Value Ref Range Status   Specimen Description URINE, CLEAN CATCH  Final   Special Requests NONE  Final   Colony Count NO GROWTH Performed at [facility]   Final   Culture NO GROWTH Performed at [facility]   Final   Report Status 03/27/2015 FINAL  Final  Blood culture (routine x 2)     Status: None   Collection Time: 03/29/15  4:15 AM  Result Value Ref Range Status   Specimen Description BLOOD LEFT ARM  Final   Special Requests BOTTLES DRAWN AEROBIC AND ANAEROBIC 10CC  Final   Culture   Final    STAPHYLOCOCCUS SPECIES (COAGULASE NEGATIVE) Note: THE SIGNIFICANCE OF ISOLATING  THIS ORGANISM FROM A SINGLE SET OF BLOOD CULTURES WHEN MULTIPLE SETS ARE DRAWN IS UNCERTAIN. PLEASE NOTIFY THE MICROBIOLOGY DEPARTMENT WITHIN ONE WEEK IF SPECIATION AND SENSITIVITIES ARE REQUIRED. Note: Gram Stain Report Called to,Read Back By and Verified With: Judie Bonus RN on 03/30/15 at 02:35 by Rise Mu Performed at [facility]    Report Status 03/31/2015 FINAL  Final  Blood culture (routine x 2)     Status: None (Preliminary result)   Collection Time: 03/29/15  4:20 AM  Result Value Ref Range Status   Specimen Description BLOOD RIGHT ARM  Final   Special Requests BOTTLES DRAWN AEROBIC ONLY 10CC  Final   Culture   Final           BLOOD CULTURE RECEIVED NO GROWTH TO DATE CULTURE WILL BE HELD FOR 5 DAYS BEFORE  ISSUING A FINAL NEGATIVE REPORT Performed at [facility]    Report Status PENDING  Incomplete  MRSA PCR Screening     Status: None   Collection Time: 03/29/15  9:15 AM  Result Value Ref Range Status   MRSA by PCR NEGATIVE NEGATIVE Final    Comment:        The GeneXpert MRSA Assay (FDA approved for NASAL specimens only), is one component of a comprehensive MRSA colonization surveillance program. It is not intended to diagnose MRSA infection nor to guide or monitor treatment for MRSA infections.      Studies:   Recent x-ray studies have been reviewed in detail by the Attending Physician  Scheduled Meds:  Scheduled Meds: . ceFEPime (MAXIPIME) IV  2 g Intravenous Q24H  . guaiFENesin  1,200 mg Oral BID  . ipratropium  0.5 mg Nebulization QID  . levalbuterol  0.63 mg Nebulization QID  . metoprolol  5 mg Intravenous 4 times per day  . pantoprazole  40 mg Oral BID  . rivaroxaban  15 mg Oral Q supper  . vancomycin  500 mg Intravenous Q12H    Time spent on care of this patient: 35 mins   Lennox Leikam T , MD   [facility] Office  [phone] Pager - Text Page per Shea Evans as per below:  On-Call/Text Page:      [email or web address]       password [email or web address]  If 7PM-7AM, please contact night-coverage [email or web address] Password [email or web address] 03/31/2015, 1:48 PM   LOS: 2 days

## 2015-03-31 NOTE — Progress Notes (Signed)
Attempted to call report to nurse. Waiting for call back.

## 2015-04-01 DIAGNOSIS — E43 Unspecified severe protein-calorie malnutrition: Secondary | ICD-10-CM

## 2015-04-01 DIAGNOSIS — J189 Pneumonia, unspecified organism: Secondary | ICD-10-CM

## 2015-04-01 DIAGNOSIS — I482 Chronic atrial fibrillation: Secondary | ICD-10-CM

## 2015-04-01 DIAGNOSIS — A419 Sepsis, unspecified organism: Secondary | ICD-10-CM

## 2015-04-01 DIAGNOSIS — J9601 Acute respiratory failure with hypoxia: Secondary | ICD-10-CM

## 2015-04-01 LAB — BASIC METABOLIC PANEL
ANION GAP: 13 (ref 5–15)
BUN: 32 mg/dL — ABNORMAL HIGH (ref 6–20)
CHLORIDE: 105 mmol/L (ref 101–111)
CO2: 25 mmol/L (ref 22–32)
CREATININE: 0.9 mg/dL (ref 0.61–1.24)
Calcium: 9.4 mg/dL (ref 8.9–10.3)
GFR calc Af Amer: >60 mL/min (ref >60)
GFR calc non Af Amer: >60 mL/min (ref >60)
Glucose, Bld: 121 mg/dL — ABNORMAL HIGH (ref 65–99)
Potassium: 3.8 mmol/L (ref 3.5–5.1)
Sodium: 143 mmol/L (ref 135–145)

## 2015-04-01 LAB — CBC
HCT: 33.1 % — ABNORMAL LOW (ref 39.0–52.0)
Hemoglobin: 10.6 g/dL — ABNORMAL LOW (ref 13.0–17.0)
MCH: 29 pg (ref 26.0–34.0)
MCHC: 32 g/dL (ref 30.0–36.0)
MCV: 90.7 fL (ref 78.0–100.0)
Platelets: 333 10*3/uL (ref 150–400)
RBC: 3.65 MIL/uL — ABNORMAL LOW (ref 4.22–5.81)
RDW: 15.3 % (ref 11.5–15.5)
WBC: 11.6 10*3/uL — AB (ref 4.0–10.5)

## 2015-04-01 LAB — TSH: TSH: 3.736 u[IU]/mL (ref 0.350–4.500)

## 2015-04-01 MED ORDER — STARCH (THICKENING) PO POWD
ORAL | Status: DC | PRN
  Filled 2015-04-01: qty 227

## 2015-04-01 MED ORDER — HALOPERIDOL LACTATE 5 MG/ML IJ SOLN
1.0000 mg | Freq: Four times a day (QID) | INTRAMUSCULAR | Status: DC | PRN
  Filled 2015-04-01: qty 0.2

## 2015-04-01 MED ORDER — METHYLPREDNISOLONE SODIUM SUCC 40 MG IJ SOLR
40.0000 mg | Freq: Two times a day (BID) | INTRAMUSCULAR | Status: DC
Start: 2015-04-01 — End: 2015-04-02
  Administered 2015-04-01 – 2015-04-02 (×3): 40 mg via INTRAVENOUS
  Filled 2015-04-01 (×4): qty 1

## 2015-04-01 MED ORDER — ALBUTEROL SULFATE (2.5 MG/3ML) 0.083% IN NEBU: 2.5000 mg | INHALATION_SOLUTION | Freq: Four times a day (QID) | RESPIRATORY_TRACT | Status: DC

## 2015-04-01 MED ORDER — ALBUTEROL SULFATE (2.5 MG/3ML) 0.083% IN NEBU
2.5000 mg | INHALATION_SOLUTION | Freq: Four times a day (QID) | RESPIRATORY_TRACT | Status: DC
  Administered 2015-04-01 – 2015-04-03 (×10): 2.5 mg via RESPIRATORY_TRACT
  Filled 2015-04-01 (×10): qty 3

## 2015-04-01 NOTE — Progress Notes (Signed)
I have left a message for Mr. Cohen daughter requesting a call back to discuss goals of care. I will await return call. Thank you for this consult.   Vinie Sill, NP Palliative Medicine Team Pager # [phone] (M-F 8a-5p) Team Phone # [phone] (Nights/Weekends)

## 2015-04-01 NOTE — Progress Notes (Signed)
ANTIBIOTIC CONSULT NOTE - FOLLOW UP  Pharmacy Consult for Vancomycin and Cefepime Indication: aspiration PNA  No Known Allergies  Patient Measurements: Height: 5\' 7"  (170.2 cm) Weight: 146 lb 9.7 oz (66.5 kg) IBW/kg (Calculated) : 66.1  Vital Signs: Temp: 97.4 F (36.3 C) (05/31 0332) Temp Source: Oral (05/31 0332) BP: 141/58 mmHg (05/31 0332) Pulse Rate: 83 (05/31 0332) Intake/Output from previous day: 05/30 0701 - 05/31 0700 In: 560 [P.O.:360; I.V.:50; IV Piggyback:150] Out: 100 [Urine:100] Intake/Output from this shift: Total I/O In: 626 [P.O.:476; IV Piggyback:150] Out: -   Labs:  Recent Labs  03/30/15 0350 03/31/15 0305 04/01/15 0351  WBC 3.3* 19.3* 11.6*  HGB 11.3* 10.6* 10.6*  PLT 383 394 333  CREATININE 0.98 1.09 0.90   Estimated Creatinine Clearance: 53 mL/min (by C-G formula based on Cr of 0.9). No results for input(s): VANCOTROUGH, VANCOPEAK, VANCORANDOM, GENTTROUGH, GENTPEAK, GENTRANDOM, TOBRATROUGH, TOBRAPEAK, TOBRARND, AMIKACINPEAK, AMIKACINTROU, AMIKACIN in the last 72 hours.   Microbiology: Recent Results (from the past 720 hour(s))  Culture, blood (routine x 2)     Status: None   Collection Time: 03/08/15  9:37 AM  Result Value Ref Range Status   Specimen Description BLOOD RIGHT WRIST  Final   Special Requests BOTTLES DRAWN AEROBIC AND ANAEROBIC 5CC  Final   Culture   Final    NO GROWTH 5 DAYS Performed at [facility]    Report Status 03/14/2015 FINAL  Final  Culture, blood (routine x 2)     Status: None   Collection Time: 03/08/15  9:45 AM  Result Value Ref Range Status   Specimen Description BLOOD RIGHT ARM  Final   Special Requests BOTTLES DRAWN AEROBIC AND ANAEROBIC 10ML  Final   Culture   Final    NO GROWTH 5 DAYS Performed at [facility]    Report Status 03/14/2015 FINAL  Final  Urine culture     Status: None   Collection Time: 03/08/15 10:10 AM  Result Value Ref Range Status   Specimen Description URINE,  CATHETERIZED  Final   Special Requests NONE  Final   Colony Count NO GROWTH Performed at [facility]   Final   Culture NO GROWTH Performed at [facility]   Final   Report Status 03/09/2015 FINAL  Final  MRSA PCR Screening     Status: None   Collection Time: 03/08/15  3:30 PM  Result Value Ref Range Status   MRSA by PCR NEGATIVE NEGATIVE Final    Comment:        The GeneXpert MRSA Assay (FDA approved for NASAL specimens only), is one component of a comprehensive MRSA colonization surveillance program. It is not intended to diagnose MRSA infection nor to guide or monitor treatment for MRSA infections.   Urine culture     Status: None   Collection Time: 03/25/15  7:48 PM  Result Value Ref Range Status   Specimen Description URINE, CLEAN CATCH  Final   Special Requests NONE  Final   Colony Count NO GROWTH Performed at [facility]   Final   Culture NO GROWTH Performed at [facility]   Final   Report Status 03/27/2015 FINAL  Final  Blood culture (routine x 2)     Status: None   Collection Time: 03/29/15  4:15 AM  Result Value Ref Range Status   Specimen Description BLOOD LEFT ARM  Final   Special Requests BOTTLES DRAWN AEROBIC AND ANAEROBIC 10CC  Final   Culture  Final    STAPHYLOCOCCUS SPECIES (COAGULASE NEGATIVE) Note: THE SIGNIFICANCE OF ISOLATING THIS ORGANISM FROM A SINGLE SET OF BLOOD CULTURES WHEN MULTIPLE SETS ARE DRAWN IS UNCERTAIN. PLEASE NOTIFY THE MICROBIOLOGY DEPARTMENT WITHIN ONE WEEK IF SPECIATION AND SENSITIVITIES ARE REQUIRED. Note: Gram Stain Report Called to,Read Back By and Verified With: Judie Bonus RN on 03/30/15 at 02:35 by Rise Mu Performed at [facility]    Report Status 03/31/2015 FINAL  Final  Blood culture (routine x 2)     Status: None (Preliminary result)   Collection Time: 03/29/15  4:20 AM  Result Value Ref Range Status   Specimen Description BLOOD RIGHT ARM  Final   Special Requests BOTTLES  DRAWN AEROBIC ONLY 10CC  Final   Culture   Final           BLOOD CULTURE RECEIVED NO GROWTH TO DATE CULTURE WILL BE HELD FOR 5 DAYS BEFORE ISSUING A FINAL NEGATIVE REPORT Performed at [facility]    Report Status PENDING  Incomplete  MRSA PCR Screening     Status: None   Collection Time: 03/29/15  9:15 AM  Result Value Ref Range Status   MRSA by PCR NEGATIVE NEGATIVE Final    Comment:        The GeneXpert MRSA Assay (FDA approved for NASAL specimens only), is one component of a comprehensive MRSA colonization surveillance program. It is not intended to diagnose MRSA infection nor to guide or monitor treatment for MRSA infections.     Anti-infectives    Start     Dose/Rate Route Frequency Ordered Stop   03/30/15 1200  ceFEPIme (MAXIPIME) 2 g in dextrose 5 % 50 mL IVPB     2 g 100 mL/hr over 30 Minutes Intravenous Every 24 hours 03/29/15 1200     03/29/15 1300  vancomycin (VANCOCIN) 500 mg in sodium chloride 0.9 % 100 mL IVPB     500 mg 100 mL/hr over 60 Minutes Intravenous Every 12 hours 03/29/15 1202     03/29/15 1130  ceFEPIme (MAXIPIME) 2 g in dextrose 5 % 50 mL IVPB     2 g 100 mL/hr over 30 Minutes Intravenous  Once 03/29/15 1122 03/29/15 1246   03/29/15 1130  vancomycin (VANCOCIN) IVPB 1000 mg/200 mL premix  Status:  Discontinued     1,000 mg 200 mL/hr over 60 Minutes Intravenous  Once 03/29/15 1122 03/29/15 1125   03/29/15 0700  vancomycin (VANCOCIN) IVPB 1000 mg/200 mL premix     1,000 mg 200 mL/hr over 60 Minutes Intravenous  Once 03/29/15 0658 03/29/15 0813   03/29/15 0700  piperacillin-tazobactam (ZOSYN) IVPB 3.375 g     3.375 g 100 mL/hr over 30 Minutes Intravenous  Once 03/29/15 0160 03/29/15 0743      Assessment: 81 yo M presented to ED 5/28 with increase cough and work of breathing.  Pt started on Vancomycin and Cefepime for empiric HCAP coverage.  Now more likely aspiration PNA.  Today is day #4 of IV abx.  WBC and pro-calcitonin trending  down.  Blood cx 1/2 CNS - likely contaminent  Goal of Therapy:  Vancomycin trough level 15-20 mcg/ml  Plan:  Continue Vancomycin 500 mg IV q12h Continue Cefepime 2gm IV q24h Follow-up abx plan, LOT.  Consider narrowing abx (d/c Vanc). If Vanc continues, will need to check a trough level in next 24-48 hours.  [employer], Pharm.D., BCPS Clinical Pharmacist Pager [phone] 04/01/2015 1:39 PM

## 2015-04-01 NOTE — Progress Notes (Signed)
[facility] PROGRESS NOTE Interim History: 81 yo male discharged to SNF for rehab stay on 5/10 after admit for COPD exacerbation and CAP. History of chronic A. fib on Xarelto, hypertension, iron deficiency anemia, hypothyroidism, chronic DHF, and biliary pancreatitis status post cholecystectomy. Since discharge PNA sxs apparently resolved but patient never returned to his former baseline w/ poor oral intake. Sent from SNF due to cough with congestion and respiratory distress. Per EMS hemodynamically stable but O2 sats 95% on 6L. Observed with increased work of breathing. Upon arrival to ER patient with continued increased work of breathing and tachypnea. ABG with PO2 70 on 6L. given nebs with some improvement but not resolution of symptoms. CT chest revealed extensive bronchitis and mucoid impaction and likely bronchopneumonia right upper lobe as well as acute T12 comp fx. Patient is DO NOT RESUSCITATE. WBC 9,900, Hg 11.2, initial lactic acid 2.03 but decr to 1.10 after treatment and fluids, electrolyte panel normal except for glucose 164, K+ 3.7   Assessment/Plan: Sepsis likely due to Versus aspiration: Swallowing evaluation pending to rule out aspiration, continue IV antibiotics. Repeat pro calcitonin is pending.  We'll place him nothing by mouth as I am concerned about ongoing aspiration. Consult Hospice for goals of care guarded prognosis  Acute respiratory failure with hypoxia due to aspiration: See above, CT imaging was done that was negative for PE, oxygen requirements continues to be minimal.  One of 2 blood cultures positive: Most likely a contaminant.  COPD: No wheezing with crackles on the right, will start him on IV steroids and inhalers.  Chronic diastolic heart failure: Seems to be euvolemic continue daily weights.  Chronic atrial fibrillation: Continue Xarelto rate control no evidence of telemetry.  Hypothyroidism: Continue Synthroid.  Severe protein  caloric malnutrition:  Acute 12 compression fracture noted on CT: Patient is asymptomatic.   Code Status: DNR Family Communication: none present  DispositiPossible :  discharge and 72 hours.    Consultants:  none  Procedures:  CT chest  Antibiotics:  Vanc 5.28>>  Zosyn 2.28>>5.29  Cefepime 5.28>>  HPI/Subjective: Patient is slightly confused, complaining of cough when he drinks.  Objective: Filed Vitals:   03/31/15 1929 03/31/15 2056 04/01/15 0320 04/01/15 0332  BP:  156/79  141/58  Pulse:  93  83  Temp:  98 F (36.7 C)  97.4 F (36.3 C)  TempSrc:  Oral  Oral  Resp:  24  26  Height:      Weight:      SpO2: 95% 94% 96% 99%    Intake/Output Summary (Last 24 hours) at 04/01/15 1004 Last data filed at 04/01/15 0856  Gross per 24 hour  Intake    286 ml  Output    100 ml  Net    186 ml   Filed Weights   03/30/15 0451 03/31/15 0023 03/31/15 0420  Weight: 66.3 kg (146 lb 2.6 oz) 66.5 kg (146 lb 9.7 oz) 66.5 kg (146 lb 9.7 oz)    Exam:  General: Alert, awake, oriented x3, in no acute distress.  HEENT: No bruits, no goiter.  Heart: Regular rate and rhythm. Lungs: Good air movement, crackles with wheezing. Abdomen: Soft, nontender, nondistended, positive bowel sounds.  Neuro: Grossly intact, nonfocal.   Data Reviewed: Basic Metabolic Panel:  Recent Labs Lab 03/29/15 0420 03/29/15 0428 03/29/15 0737 03/30/15 0350 03/31/15 0305 04/01/15 0351  NA 139 140 138 142 142 143  K 3.7 3.7 3.6 3.7 5.1 3.8  CL 103 102 101 109 110  105  CO2 26  --  21* 24 23 25   GLUCOSE 163* 164* 135* 218* 144* 121*  BUN 11 13 12 16  31* 32*  CREATININE 0.88 0.80 0.92 0.98 1.09 0.90  CALCIUM 9.2  --  8.8* 9.6 9.6 9.4  MG  --   --  1.9  --  2.1  --    Liver Function Tests:  Recent Labs Lab 03/29/15 0737 03/30/15 0350 03/31/15 0305  AST 18 18 35  ALT 10* 11* 12*  ALKPHOS 106 98 89  BILITOT 0.4 0.5 1.6*  PROT 6.7 7.0 6.5  ALBUMIN 3.3* 3.2* 3.1*   No results for  input(s): LIPASE, AMYLASE in the last 168 hours. No results for input(s): AMMONIA in the last 168 hours. CBC:  Recent Labs Lab 03/25/15 1825 03/29/15 0428 03/29/15 0737 03/30/15 0350 03/31/15 0305 04/01/15 0351  WBC 6.1  --  9.9 3.3* 19.3* 11.6*  NEUTROABS 2.0  --  4.4  --   --   --   HGB 10.9* 12.9* 11.2* 11.3* 10.6* 10.6*  HCT 33.7* 38.0* 34.2* 34.5* 32.5* 33.1*  MCV 90.6  --  90.0 89.6 90.0 90.7  PLT 415*  --  365 383 394 333   Cardiac Enzymes: No results for input(s): CKTOTAL, CKMB, CKMBINDEX, TROPONINI in the last 168 hours. BNP (last 3 results)  Recent Labs  02/05/15 0105 03/29/15 0422  BNP 144.1* 327.5*    ProBNP (last 3 results)  Recent Labs  09/06/14 0033  PROBNP 1947.0*    CBG: No results for input(s): GLUCAP in the last 168 hours.  Recent Results (from the past 240 hour(s))  Urine culture     Status: None   Collection Time: 03/25/15  7:48 PM  Result Value Ref Range Status   Specimen Description URINE, CLEAN CATCH  Final   Special Requests NONE  Final   Colony Count NO GROWTH Performed at [facility]   Final   Culture NO GROWTH Performed at [facility]   Final   Report Status 03/27/2015 FINAL  Final  Blood culture (routine x 2)     Status: None   Collection Time: 03/29/15  4:15 AM  Result Value Ref Range Status   Specimen Description BLOOD LEFT ARM  Final   Special Requests BOTTLES DRAWN AEROBIC AND ANAEROBIC 10CC  Final   Culture   Final    STAPHYLOCOCCUS SPECIES (COAGULASE NEGATIVE) Note: THE SIGNIFICANCE OF ISOLATING THIS ORGANISM FROM A SINGLE SET OF BLOOD CULTURES WHEN MULTIPLE SETS ARE DRAWN IS UNCERTAIN. PLEASE NOTIFY THE MICROBIOLOGY DEPARTMENT WITHIN ONE WEEK IF SPECIATION AND SENSITIVITIES ARE REQUIRED. Note: Gram Stain Report Called to,Read Back By and Verified With: Judie Bonus RN on 03/30/15 at 02:35 by Rise Mu Performed at [facility]    Report Status 03/31/2015 FINAL  Final  Blood culture (routine  x 2)     Status: None (Preliminary result)   Collection Time: 03/29/15  4:20 AM  Result Value Ref Range Status   Specimen Description BLOOD RIGHT ARM  Final   Special Requests BOTTLES DRAWN AEROBIC ONLY 10CC  Final   Culture   Final           BLOOD CULTURE RECEIVED NO GROWTH TO DATE CULTURE WILL BE HELD FOR 5 DAYS BEFORE ISSUING A FINAL NEGATIVE REPORT Performed at [facility]    Report Status PENDING  Incomplete  MRSA PCR Screening     Status: None   Collection Time: 03/29/15  9:15 AM  Result Value Ref Range Status   MRSA by PCR NEGATIVE NEGATIVE Final    Comment:        The GeneXpert MRSA Assay (FDA approved for NASAL specimens only), is one component of a comprehensive MRSA colonization surveillance program. It is not intended to diagnose MRSA infection nor to guide or monitor treatment for MRSA infections.      Studies: Dg Chest Port 1 View  03/31/2015   CLINICAL DATA:  Ammonia in shortness of Breath  EXAM: PORTABLE CHEST - 1 VIEW  COMPARISON:  03/30/2015  FINDINGS: Cardiac shadow is enlarged but stable. The lungs are well aerated bilaterally. Some slight increase in the degree of right basilar atelectasis is noted. Patchy stable changes are noted particularly in the right upper lobe. No focal confluent infiltrate is seen.  IMPRESSION: Slight increase in the degree of right basilar atelectasis.  Stable patchy changes similar to that seen on the previous day.   Electronically Signed   By: Inez Catalina M.D.   On: 03/31/2015 07:32    Scheduled Meds: . ceFEPime (MAXIPIME) IV  2 g Intravenous Q24H  . feeding supplement (ENSURE ENLIVE)  237 mL Oral BID BM  . guaiFENesin  1,200 mg Oral BID  . metoprolol tartrate  25 mg Oral BID  . mometasone-formoterol  2 puff Inhalation BID  . pantoprazole  40 mg Oral Daily  . pravastatin  20 mg Oral Daily  . rivaroxaban  15 mg Oral Q supper  . vancomycin  500 mg Intravenous Q12H   Continuous Infusions:   Time Spent: 25  min   Charlynne Cousins  [facility] Pager [phone]. If 7PM-7AM, please contact night-coverage at [email or web address], password [facility] 04/01/2015, 10:04 AM  LOS: 3 days

## 2015-04-01 NOTE — Progress Notes (Signed)
Patient pulled S.W. Out with Kerlix and mittens . Patient puts mitten in his mouth and pulls them off.Patient remains confused. Charge R.N. Aware.

## 2015-04-01 NOTE — Evaluation (Signed)
Physical Therapy Evaluation Patient Details Name: Todd [name] MRN: [id number] DOB: October 07, 1924 Today's Date: 04/01/2015   History of Present Illness  Patient is an 81 y/o male with history of DM, A-fib on xarelto, CHF, COPD re-admtted from SNF with AMS; + pneumonia, sepsis, COPD exacerbation  Clinical Impression  Pt admitted with above diagnosis. Pt currently with functional limitations due to the deficits listed below (see PT Problem List).  Pt will benefit from skilled PT to increase their independence and safety with mobility to allow discharge to the venue listed below.       Follow Up Recommendations SNF;Supervision/Assistance - 24 hour    Equipment Recommendations  None recommended by PT    Recommendations for Other Services       Precautions / Restrictions Precautions Precautions: Fall Restrictions Weight Bearing Restrictions: No      Mobility  Bed Mobility Overal bed mobility: Needs Assistance Bed Mobility: Supine to Sit;Sit to Sidelying     Supine to sit: HOB elevated;Min guard   Sit to sidelying: Mod assist General bed mobility comments: incr time and effort with vc to come to sit EOB (HOB elevated and rail); assist to raise legs onto bed  Transfers Overall transfer level: Needs assistance Equipment used: None Transfers: Sit to/from Stand;Lateral/Scoot Transfers Sit to Stand: Max assist;From elevated surface        Lateral/Scoot Transfers: Max assist General transfer comment: attempted sit to stand with 1 person max assist and pt with strong posterior lean and unable to achieve more than 1/2 standing; lateral scoot to HOB in sitting  Ambulation/Gait                Stairs            Wheelchair Mobility    Modified Rankin (Stroke Patients Only)       Balance Overall balance assessment: Needs assistance Sitting-balance support: Bilateral upper extremity supported;Feet supported Sitting balance-Leahy Scale: Poor Sitting balance -  Comments: leans posterior and to his RT                                     Pertinent Vitals/Pain SaO2 97% on 2L throughout HR 84  Pain Assessment: No/denies pain    Home Living Family/patient expects to be discharged to:: Skilled nursing facility                 Additional Comments: no family present; recently discharged to SNF from hospital    Prior Function Level of Independence: Needs assistance         Comments: unclear progress made at SNF; pt with AMS and no family present     Hand Dominance        Extremity/Trunk Assessment   Upper Extremity Assessment: Defer to OT evaluation           Lower Extremity Assessment: RLE deficits/detail;LLE deficits/detail RLE Deficits / Details: AAROM WFL; hip/knee extension in supine 3 LLE Deficits / Details: hip/knee AAROM WFL; ankle limited DF to neutral; hip/knee extension 3  Cervical / Trunk Assessment: Kyphotic  Communication   Communication: HOH  Cognition Arousal/Alertness: Awake/alert Behavior During Therapy: WFL for tasks assessed/performed Overall Cognitive Status: No family/caregiver present to determine baseline cognitive functioning (oriented to person only)                      General Comments General comments (skin integrity, edema, etc.): following commands  Exercises General Exercises - Lower Extremity Ankle Circles/Pumps: AROM;Both;10 reps      Assessment/Plan    PT Assessment Patient needs continued PT services  PT Diagnosis Generalized weakness;Altered mental status   PT Problem List Decreased strength;Decreased activity tolerance;Decreased balance;Decreased mobility;Decreased cognition;Decreased knowledge of use of DME;Decreased safety awareness  PT Treatment Interventions DME instruction;Gait training;Functional mobility training;Therapeutic activities;Therapeutic exercise;Balance training;Cognitive remediation;Patient/family education   PT Goals (Current  goals can be found in the Care Plan section) Acute Rehab PT Goals Patient Stated Goal: not stated PT Goal Formulation: Patient unable to participate in goal setting Time For Goal Achievement: 04/15/15 Potential to Achieve Goals: Good    Frequency Min 2X/week   Barriers to discharge        Co-evaluation               End of Session Equipment Utilized During Treatment: Oxygen Activity Tolerance: Patient tolerated treatment well Patient left: in bed;with call bell/phone within reach;with bed alarm set;Other (comment) (per RN, 4 bedrails up (awaiting sitter)) Nurse Communication: Mobility status         Time: 7340-3709 PT Time Calculation (min) (ACUTE ONLY): 16 min   Charges:   PT Evaluation $Initial PT Evaluation Tier I: 1 Procedure     PT G Codes:        [id number] 2015-04-11, 10:10 AM Pager [phone]

## 2015-04-01 NOTE — Evaluation (Signed)
Occupational Therapy Evaluation Patient Details Name: Todd [name] MRN: [id number] DOB: 06-07-1924 Today's Date: 04/01/2015    History of Present Illness Patient is an 80 y/o male with history of DM, A-fib on xarelto, CHF, COPD re-admtted from SNF with AMS; + pneumonia, sepsis, COPD exacerbation   Clinical Impression   Unclear pt's PLOF or progress made while in SNF.  Demonstrates ability to perform self feeding and simple grooming with set up and supervision. Moderate to total assist with bathing and dressing.  Pt with poor sitting balance and attempt to stand required max assist with heavy posterior lean and flexed posture.  Pt is cooperative and friendly, but disoriented to all except his name. Plan is for SNF.  Will defer further OT to SNF.   Follow Up Recommendations  SNF    Equipment Recommendations       Recommendations for Other Services       Precautions / Restrictions Precautions Precautions: Fall Restrictions Weight Bearing Restrictions: No      Mobility Bed Mobility Overal bed mobility: Needs Assistance Bed Mobility: Supine to Sit;Sit to Supine     Supine to sit: HOB elevated;Min guard Sit to supine: Min guard;HOB elevated Sit to sidelying: Mod assist General bed mobility comments: incr time and effort with vc to come to sit EOB (HOB elevated and rail)  Transfers Overall transfer level: Needs assistance Equipment used: None Transfers: Sit to/from Stand Sit to Stand: Max assist;From elevated surface        Lateral/Scoot Transfers: Max assist General transfer comment: attempted sit to stand with 1 person max assist and pt with strong posterior lean and unable to achieve more than 1/2 standing    Balance Overall balance assessment: Needs assistance Sitting-balance support: Bilateral upper extremity supported;Feet supported Sitting balance-Leahy Scale: Poor Sitting balance - Comments: leans posterior and to his RT                                    ADL Overall ADL's : Needs assistance/impaired Eating/Feeding: Supervision/ safety;Sitting;Set up   Grooming: Wash/dry face;Wash/dry hands;Min guard;Sitting           Upper Body Dressing : Moderate assistance;Sitting   Lower Body Dressing: Total assistance;Sit to/from stand                 General ADL Comments: Pt sat EOB x 8 minutes with tendency to lean toward R, flexed posture.     Vision     Perception     Praxis      Pertinent Vitals/Pain Pain Assessment: No/denies pain     Hand Dominance Right   Extremity/Trunk Assessment Upper Extremity Assessment Upper Extremity Assessment: Generalized weakness   Lower Extremity Assessment Lower Extremity Assessment: Defer to PT evaluation RLE Deficits / Details: AAROM WFL; hip/knee extension in supine 3 LLE Deficits / Details: hip/knee AAROM WFL; ankle limited DF to neutral; hip/knee extension 3   Cervical / Trunk Assessment Cervical / Trunk Assessment: Kyphotic   Communication Communication Communication: HOH   Cognition Arousal/Alertness: Awake/alert Behavior During Therapy: WFL for tasks assessed/performed Overall Cognitive Status: No family/caregiver present to determine baseline cognitive functioning                     General Comments       Exercises       Shoulder Instructions      Home Living Family/patient expects to be discharged to::  Skilled nursing facility                                 Additional Comments: no family present; recently discharged to SNF from hospital      Prior Functioning/Environment Level of Independence: Needs assistance        Comments: unclear progress made at SNF; pt with AMS and no family present    OT Diagnosis: Generalized weakness;Cognitive deficits   OT Problem List:     OT Treatment/Interventions:      OT Goals(Current goals can be found in the care plan section) Acute Rehab OT Goals Patient Stated Goal: not  stated  OT Frequency:     Barriers to D/C:            Co-evaluation              End of Session Equipment Utilized During Treatment: Oxygen  Activity Tolerance: Patient limited by fatigue Patient left: in bed;with call bell/phone within reach;with bed alarm set;with nursing/sitter in room   Time: 1004-1017 OT Time Calculation (min): 13 min Charges:  OT General Charges $OT Visit: 1 Procedure OT Evaluation $Initial OT Evaluation Tier I: 1 Procedure G-Codes:    Malka So 04/01/2015, 11:31 AM [phone]

## 2015-04-01 NOTE — Progress Notes (Signed)
Patient picking at [facility]. Applied kerlix and soft mittens for safety.

## 2015-04-01 NOTE — Progress Notes (Signed)
Spoke with Todd [name] N.P. Order to leave s.w. Out for now. No I.V. Access.

## 2015-04-01 NOTE — Clinical Social Work Note (Signed)
Clinical Social Work Assessment  Patient Details  Name: Todd [name] MRN: [id number] Date of Birth: [birth year]  Date of referral:  04/01/15               Reason for consult:  Facility Placement                Permission sought to share information with:  Family Supports Permission granted to share information::  No (pt is disoriented and requiring a Actuary)  Name::     Advertising account executive::  [employer]  Relationship::  daughter  Contact Information:     Housing/Transportation Living arrangements for the past 2 months:  [place] of Information:  Adult Children Patient Interpreter Needed:  None Criminal Activity/Legal Involvement Pertinent to Current Situation/Hospitalization:  No - Comment as needed Significant Relationships:  Adult Children Lives with:  Facility Resident (been at [facility] since February- prior to SNF was living at home with dtr Todd [name])) Do you feel safe going back to the place where you live?  Yes Need for family participation in patient care:  Yes (Comment)  Care giving concerns:  None- patient has been at [facility] since February 2016   Social Worker assessment / plan:  Patient spoke with pt dtr, Todd [name], about pt return to [place] at time of DC  Employment status:  Retired Forensic scientist:  Programmer, applications PT Recommendations:  [place] / Referral to community resources:  [place]  Patient/Family's Response to care:  Pt dtr is agreeable to pt return to [place] when medically stable and feels like pt has received good care at their facility  Patient/Family's Understanding of and Emotional Response to Diagnosis, Current Treatment, and Prognosis:  Unclear-pt dtr did not have questions concerning pt prognosis/diagnosis  Emotional Assessment Appearance:  Other (Comment Required (did not assess- CSW spoke with pt dtr on the phone) Attitude/Demeanor/Rapport:  Unable to  Assess Affect (typically observed):  Unable to Assess Orientation:  Oriented to Self Alcohol / Substance use:  Not Applicable Psych involvement (Current and /or in the community):  No (Comment)  Discharge Needs  Concerns to be addressed:  Discharge Planning Concerns Readmission within the last 30 days:  Yes Current discharge risk:  None Barriers to Discharge:  Continued Medical Work up, Requiring sitter/restraints   Cranford Mon, LCSW 04/01/2015, 10:25 AM

## 2015-04-01 NOTE — Progress Notes (Signed)
Speech Language Pathology Treatment: Dysphagia  Patient Details Name: Todd [name] MRN: [id number] DOB: 01/03/1924 Today's Date: 04/01/2015 Time: 6295-2841 SLP Time Calculation (min) (ACUTE ONLY): 11 min  Assessment / Plan / Recommendation Clinical Impression  No overt indications aspiration during observation with Ensure thickened to nectar consistency. Required moderate cues to consume small sips during self feeding and recalled after several minute delay. Congested respirations prior and during consumption. Continue Dys 3 and nectar thick with full supervision for strategies. Continue ST   HPI Other Pertinent Information: This is a 81 yo male recent discharge 5/10 after tx for COPD exacerbation and CAP. DC to SNF for rehab on D3 diet. History of chronic A. fib on Xarelto, hypertension, iron deficiency anemia, hypothyroidism chronic DHF and history biliary pancreatitis status post cholecystectomy. Sent from SNF 2/2 cough with congestion and respiratory distress. CT chest revealed extensive bronchitis and mucoid impaction and likely bronchopneumonia right upper lobe as well as acute T12 comp fx.   Pertinent Vitals Pain Assessment: No/denies pain  SLP Plan  Continue with current plan of care    Recommendations Diet recommendations: Dysphagia 3 (mechanical soft);Nectar-thick liquid Liquids provided via: Cup;No straw Medication Administration: Crushed with puree Supervision: Patient able to self feed;Full supervision/cueing for compensatory strategies Compensations: Clear throat intermittently;Slow rate;Small sips/bites;Multiple dry swallows after each bite/sip Postural Changes and/or Swallow Maneuvers: Seated upright 90 degrees              Oral Care Recommendations: Oral care BID Follow up Recommendations: Skilled Nursing facility Plan: Continue with current plan of care    GO     Houston Siren 04/01/2015, 2:24 PM  Orbie Pyo Colvin Caroli.Ed [employer]  [phone]

## 2015-04-01 NOTE — Progress Notes (Signed)
Patient pulled mittens of, Tele. offf and S.W. Out again. Pateint has a large skin tear from where he pull S.W. Out. Applied pink foam skin tear. Charge R.N. Aware and into assess patient. Patient also incont of urine. MOve patient to camera room for safety.

## 2015-04-02 DIAGNOSIS — Z515 Encounter for palliative care: Secondary | ICD-10-CM

## 2015-04-02 DIAGNOSIS — Z66 Do not resuscitate: Secondary | ICD-10-CM

## 2015-04-02 DIAGNOSIS — R1310 Dysphagia, unspecified: Secondary | ICD-10-CM

## 2015-04-02 DIAGNOSIS — R0600 Dyspnea, unspecified: Secondary | ICD-10-CM

## 2015-04-02 LAB — VANCOMYCIN, TROUGH: VANCOMYCIN TR: 12 ug/mL (ref 10.0–20.0)

## 2015-04-02 MED ORDER — PREDNISONE 20 MG PO TABS
30.0000 mg | ORAL_TABLET | Freq: Every day | ORAL | Status: DC
  Administered 2015-04-03 – 2015-04-04 (×2): 30 mg via ORAL
  Filled 2015-04-02 (×3): qty 1

## 2015-04-02 MED ORDER — PIPERACILLIN-TAZOBACTAM 3.375 G IVPB
3.3750 g | Freq: Three times a day (TID) | INTRAVENOUS | Status: DC
  Administered 2015-04-02 – 2015-04-04 (×7): 3.375 g via INTRAVENOUS
  Filled 2015-04-02 (×10): qty 50

## 2015-04-02 MED ORDER — VANCOMYCIN HCL IN DEXTROSE 750-5 MG/150ML-% IV SOLN
750.0000 mg | Freq: Two times a day (BID) | INTRAVENOUS | Status: DC
  Administered 2015-04-02 – 2015-04-04 (×5): 750 mg via INTRAVENOUS
  Filled 2015-04-02 (×8): qty 150

## 2015-04-02 MED ORDER — GUAIFENESIN 100 MG/5ML PO SOLN
600.0000 mg | Freq: Four times a day (QID) | ORAL | Status: DC
  Administered 2015-04-02 – 2015-04-05 (×12): 600 mg via ORAL
  Filled 2015-04-02 (×18): qty 30

## 2015-04-02 MED ORDER — ACETAMINOPHEN 325 MG PO TABS
650.0000 mg | ORAL_TABLET | Freq: Four times a day (QID) | ORAL | Status: DC | PRN
  Administered 2015-04-02 – 2015-04-03 (×2): 650 mg via ORAL
  Filled 2015-04-02 (×2): qty 2

## 2015-04-02 NOTE — Progress Notes (Signed)
[facility] PROGRESS NOTE Interim History: 81 yo male discharged to SNF for rehab stay on 5/10 after admit for COPD exacerbation and CAP. History of chronic A. fib on Xarelto, hypertension, iron deficiency anemia, hypothyroidism, chronic DHF, and biliary pancreatitis status post cholecystectomy. Since discharge PNA sxs apparently resolved but patient never returned to his former baseline w/ poor oral intake. Sent from SNF due to cough with congestion and respiratory distress. Per EMS hemodynamically stable but O2 sats 95% on 6L. Observed with increased work of breathing. Upon arrival to ER patient with continued increased work of breathing and tachypnea. ABG with PO2 70 on 6L. given nebs with some improvement but not resolution of symptoms. CT chest revealed extensive bronchitis and mucoid impaction and likely bronchopneumonia right upper lobe as well as acute T12 comp fx. Patient is DO NOT RESUSCITATE. WBC 9,900, Hg 11.2, initial lactic acid 2.03 but decr to 1.10 after treatment and fluids, electrolyte panel normal except for glucose 164, K+ 3.7  HPI/Subjective: - no chest pain, shortness of breath, no abdominal pain, nausea or vomiting.  - confused this morning - hard of hearing  Assessment/Plan: Sepsis likely due to HCAP vs aspiration pneumonia - patient evaluated by SLP, recommended dysphagia 3 (mechanical soft and nectar thick liquid) - continue HCAP coverage with vancomycin and zosyn. Will change cefepime to zosyn given pharmacy shortage  Positive blood cultures - cultures obtained on 5/28 showed coag negative staph speciated on 5/28 in 1 set and the second set is now positive as of 6/1 with GPC in clusters - continue vancomycin, await speciation for now - repeat blood cultures in am   Acute respiratory failure with hypoxia due to aspiration: - See above, CT imaging was done that was negative for PE, oxygen requirements continues to be minimal.  COPD: - No wheezing with  crackles on the right - transition to po prednisone  Chronic diastolic heart failure: - Seems to be euvolemic continue daily weights.  Chronic atrial fibrillation: - Continue Xarelto  - rate controlled  Hypothyroidism: - Continue Synthroid.  Severe protein caloric malnutrition:  Acute 12 compression fracture noted on CT: - Patient is asymptomatic.   Code Status: DNR Family Communication: none present  DispositiPossible :  SNF when stable   Consultants:  none  Procedures:  CT chest  Antibiotics:  Vanc 5.28>>  Zosyn 2.28>>5.29  Cefepime 5.28>>   Objective: Filed Vitals:   04/02/15 0301 04/02/15 0549 04/02/15 0842 04/02/15 1021  BP:  149/75  138/51  Pulse:  73  95  Temp:  97.9 F (36.6 C)    TempSrc:  Axillary    Resp:  18    Height:      Weight:  64.6 kg (142 lb 6.7 oz)    SpO2: 94% 95% 96% 95%    Intake/Output Summary (Last 24 hours) at 04/02/15 1320 Last data filed at 04/02/15 1108  Gross per 24 hour  Intake    720 ml  Output    225 ml  Net    495 ml   Filed Weights   03/31/15 0023 03/31/15 0420 04/02/15 0549  Weight: 66.5 kg (146 lb 9.7 oz) 66.5 kg (146 lb 9.7 oz) 64.6 kg (142 lb 6.7 oz)   Exam: General: Alert, awake, in no acute distress, hard of hearing, confused HEENT: No bruits, no goiter.  Heart: irregular, no MRG, no edema Lungs: Good air movement, no wheezing Abdomen: Soft, nontender, nondistended, positive bowel sounds.  Neuro: Grossly intact, nonfocal. Psych: confused  Data Reviewed: Basic Metabolic Panel:  Recent Labs Lab 03/29/15 0420 03/29/15 0428 03/29/15 0737 03/30/15 0350 03/31/15 0305 04/01/15 0351  NA 139 140 138 142 142 143  K 3.7 3.7 3.6 3.7 5.1 3.8  CL 103 102 101 109 110 105  CO2 26  --  21* 24 23 25   GLUCOSE 163* 164* 135* 218* 144* 121*  BUN 11 13 12 16  31* 32*  CREATININE 0.88 0.80 0.92 0.98 1.09 0.90  CALCIUM 9.2  --  8.8* 9.6 9.6 9.4  MG  --   --  1.9  --  2.1  --    Liver Function  Tests:  Recent Labs Lab 03/29/15 0737 03/30/15 0350 03/31/15 0305  AST 18 18 35  ALT 10* 11* 12*  ALKPHOS 106 98 89  BILITOT 0.4 0.5 1.6*  PROT 6.7 7.0 6.5  ALBUMIN 3.3* 3.2* 3.1*   CBC:  Recent Labs Lab 03/29/15 0428 03/29/15 0737 03/30/15 0350 03/31/15 0305 04/01/15 0351  WBC  --  9.9 3.3* 19.3* 11.6*  NEUTROABS  --  4.4  --   --   --   HGB 12.9* 11.2* 11.3* 10.6* 10.6*  HCT 38.0* 34.2* 34.5* 32.5* 33.1*  MCV  --  90.0 89.6 90.0 90.7  PLT  --  365 383 394 333   BNP (last 3 results)  Recent Labs  02/05/15 0105 03/29/15 0422  BNP 144.1* 327.5*    ProBNP (last 3 results)  Recent Labs  09/06/14 0033  PROBNP 1947.0*     Recent Results (from the past 240 hour(s))  Urine culture     Status: None   Collection Time: 03/25/15  7:48 PM  Result Value Ref Range Status   Specimen Description URINE, CLEAN CATCH  Final   Special Requests NONE  Final   Colony Count NO GROWTH Performed at [facility]   Final   Culture NO GROWTH Performed at [facility]   Final   Report Status 03/27/2015 FINAL  Final  Blood culture (routine x 2)     Status: None   Collection Time: 03/29/15  4:15 AM  Result Value Ref Range Status   Specimen Description BLOOD LEFT ARM  Final   Special Requests BOTTLES DRAWN AEROBIC AND ANAEROBIC 10CC  Final   Culture   Final    STAPHYLOCOCCUS SPECIES (COAGULASE NEGATIVE) Note: THE SIGNIFICANCE OF ISOLATING THIS ORGANISM FROM A SINGLE SET OF BLOOD CULTURES WHEN MULTIPLE SETS ARE DRAWN IS UNCERTAIN. PLEASE NOTIFY THE MICROBIOLOGY DEPARTMENT WITHIN ONE WEEK IF SPECIATION AND SENSITIVITIES ARE REQUIRED. Note: Gram Stain Report Called to,Read Back By and Verified With: Judie Bonus RN on 03/30/15 at 02:35 by Rise Mu Performed at [facility]    Report Status 03/31/2015 FINAL  Final  Blood culture (routine x 2)     Status: None (Preliminary result)   Collection Time: 03/29/15  4:20 AM  Result Value Ref Range Status    Specimen Description BLOOD RIGHT ARM  Final   Special Requests BOTTLES DRAWN AEROBIC ONLY 10CC  Final   Culture   Final    GRAM POSITIVE COCCI IN CLUSTERS Note: Gram Stain Report Called to,Read Back By and Verified With:  [place] ESTE @ [id number] ON 04/02/15 ROHAL Performed at [facility]    Report Status PENDING  Incomplete  MRSA PCR Screening     Status: None   Collection Time: 03/29/15  9:15 AM  Result Value Ref Range Status   MRSA by PCR NEGATIVE NEGATIVE Final  Comment:        The GeneXpert MRSA Assay (FDA approved for NASAL specimens only), is one component of a comprehensive MRSA colonization surveillance program. It is not intended to diagnose MRSA infection nor to guide or monitor treatment for MRSA infections.      Studies: No results found.  Scheduled Meds: . albuterol  2.5 mg Nebulization Q6H  . ceFEPime (MAXIPIME) IV  2 g Intravenous Q24H  . feeding supplement (ENSURE ENLIVE)  237 mL Oral BID BM  . guaiFENesin  600 mg Oral 4 times per day  . methylPREDNISolone (SOLU-MEDROL) injection  40 mg Intravenous Q12H  . metoprolol tartrate  25 mg Oral BID  . mometasone-formoterol  2 puff Inhalation BID  . pantoprazole  40 mg Oral Daily  . pravastatin  20 mg Oral Daily  . rivaroxaban  15 mg Oral Q supper  . vancomycin  500 mg Intravenous Q12H   Continuous Infusions:   Time Spent: 25 min  Marzetta Board  [facility] Pager [phone]. If 7PM-7AM, please contact night-coverage at [email or web address], password [facility] 04/02/2015, 1:20 PM  LOS: 4 days

## 2015-04-02 NOTE — Consult Note (Signed)
Consultation Note Date: 04/02/2015   Patient Name: Todd [name]  DOB: 1924/02/08  MRN: [id number]  Age / Sex: 81 y.o., male   PCP: Susy Frizzle, MD Referring Physician: Caren Griffins, MD  Reason for Consultation: Establishing goals of care  Palliative Care Assessment and Plan Summary of Established Goals of Care and Medical Treatment Preferences    Palliative Care Discussion Held Today Contacts/Participants in Discussion: Primary Decision Maker: Asencion Partridge [phone], [phone])    Todd [name] is slightly distressed and saying "I just don't feel good" but is unable to explain why/what doesn't feel good. He says "I wish my mother was here - she always makes me feel better." I spoke with his daughter, [name], who acknowledges her father's decline and tells me that her mother and his wife of 79 yrs died in 2023-11-05. He began to decline shortly after her death - lying in bed, sleeping, not eating much. He has been in [place] since February but [name] says they have told her that he is not progressing and insurance will not continue to pay for him to be there. [name] is open to guidance and acknowledges "dad has been trying to go since mom died but I have been holding him here." We discussed options for return to [facility] for custodial care or hospice at home. We also discussed hospice facility (her mom died at [facility] and [name] says it was very peaceful) but he is likely not eligible at this time - with decline he may likely be a candidate. Her main struggle is with the thought of not giving him antibiotics and feeling like he would suffer - we discussed alternative treatment for comfort other than antibiotics. [name] is very open and reasonable. I will continue to work with her while Mr. Goodley is hospitalized.   Code Status/Advance Care Planning:  DNR  Symptom Management:   Dyspnea: May need low dose oxygen for comfort (2L) and may utilize roxanol 2.5-5 mg prn.    Congestion/secretions: Agree with continuing guaifenesin and nebulizers.   Psycho-social/Spiritual:   Support System: Daughter [name] is very supportive. Not much else family.   Prognosis: Weeks to months  Discharge Planning: To be determined.        Chief Complaint/History of Present Illness: 81 yo male discharged to SNF for rehab stay on 5/10 after admit for COPD exacerbation and CAP. History of chronic A. fib on Xarelto, hypertension, iron deficiency anemia, hypothyroidism, chronic CHF, and biliary pancreatitis status post cholecystectomy. Since discharge PNA symptoms apparently resolved but patient never returned to his former baseline with continued very poor oral intake. Sent from SNF due to cough with congestion and respiratory distress. Per EMS hemodynamically stable but O2 sats 95% on 6L with increased work of breathing. Treatment for HCAP but likely aspiration pneumonia with continued risk for aspiration on top of COPD, diastolic HF, A fib.   Primary Diagnoses  Present on Admission:  . Sepsis . HLD (hyperlipidemia) . Chronic atrial fibrillation . Acute respiratory failure with hypoxia . Chronic diastolic CHF (congestive heart failure) . Hypothyroidism . Protein-calorie malnutrition, severe . HCAP (healthcare-associated pneumonia) . Anemia, iron deficiency . COPD (chronic obstructive pulmonary disease)  Palliative Review of Systems: Denies pain. Unable to assess further except when asked about his breathing he says "feels tight."  I have reviewed the medical record, interviewed the patient and family, and examined the patient. The following aspects are pertinent.  Past Medical History  Diagnosis Date  . Asthma   . Hypertension   .  A-fib   . Hyperlipidemia   . Shortness of breath   . CHF (congestive heart failure) 10/28/2011  . Forgetfulness   . PVC's (premature ventricular contractions)   . Mild aortic insufficiency   . Dementia     [name] 02/05/2015  .  Failure to thrive in adult     /notes 02/05/2015  . COPD (chronic obstructive pulmonary disease)   . Hypothyroidism     [name] 02/05/2015  . Fall 02/04/2015    with altered mental status off of his baseline after he suffered a mechanical fall /notes 02/04/2015  . CAP (community acquired pneumonia)     [name] 02/05/2015  . Colon cancer    History   Social History  . Marital Status: Widowed    Spouse Name: N/A  . Number of Children: 2  . Years of Education: 12   Occupational History  . 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 Smokeless tobacco: Not on file     Comment: quit 30+ yrs ago  . Alcohol Use: No  . Drug Use: No  . Sexual Activity: No   Other Topics Concern  . None   Social History Narrative   [place] man. 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 Widowed 03-Feb-2014. Had a son who died at 58. Has a daughter, 2 grandchildren, 2 great-grandchildren. Reports that he was living with his daughter   Family History  Problem Relation Age of Onset  . Hyperlipidemia Father   . Hypertension Father   . Heart disease Father   . Asthma Father   . Stroke Father   . Heart failure Father   . Heart disease Mother   . Heart failure Mother   . Heart attack Sister    Scheduled Meds: . albuterol  2.5 mg Nebulization Q6H  . feeding supplement (ENSURE ENLIVE)  237 mL Oral BID BM  . guaiFENesin  600 mg Oral 4 times per day  . metoprolol tartrate  25 mg Oral BID  . mometasone-formoterol  2 puff Inhalation BID  . pantoprazole  40 mg Oral Daily  . pravastatin  20 mg Oral Daily  . [START ON 04/03/2015] predniSONE  30 mg Oral Q breakfast  . rivaroxaban  15 mg Oral Q supper  . vancomycin  750 mg Intravenous Q12H   Continuous Infusions:  PRN Meds:.acetaminophen, food thickener, haloperidol lactate, levalbuterol, ondansetron **OR** ondansetron (ZOFRAN)  IV Medications Prior to Admission:  Prior to Admission medications   Medication Sig Start Date End Date Taking? Authorizing Provider  ADVAIR DISKUS 500-50 MCG/DOSE AEPB INHALE 1 PUFF TWICE A DAY 12/23/14  Yes Susy Frizzle, MD  feeding supplement, ENSURE ENLIVE, (ENSURE ENLIVE) LIQD Take 237 mLs by mouth 2 (two) times daily between meals. 03/11/15  Yes Geradine Girt, DO  ferrous sulfate 325 (65 FE) MG tablet TAKE 1 TABLET BY MOUTH EVERY DAY 12/23/14  Yes Susy Frizzle, MD  fluticasone [facility]) 50 MCG/ACT nasal spray Place 2 sprays into both nostrils daily.   Yes Historical Provider, MD  guaiFENesin 200 MG tablet Take 200 mg by mouth every 4 (four) hours as needed for cough or to loosen phlegm.   Yes Historical Provider, MD  ipratropium-albuterol (DUONEB) 0.5-2.5 (3) MG/3ML SOLN Take 3 mLs by nebulization every 6 (six) hours as needed (SOB/Wheezing).   Yes Historical Provider,  MD  levothyroxine (SYNTHROID, LEVOTHROID) 25 MCG tablet TAKE 1 TABLET BY MOUTH EVERY DAY 03/27/15  Yes Susy Frizzle, MD  losartan (COZAAR) 50 MG tablet Take 50 mg by mouth daily. 01/23/15  Yes Historical Provider, MD  omeprazole (PRILOSEC) 40 MG capsule Take 40 mg by mouth daily.   Yes Historical Provider, MD  pravastatin (PRAVACHOL) 20 MG tablet Take 20 mg by mouth daily.   Yes Historical Provider, MD  rivaroxaban (XARELTO) 20 MG TABS tablet Take 1 tablet (20 mg total) by mouth daily with supper. 03/11/15  Yes Geradine Girt, DO  venlafaxine XR (EFFEXOR XR) 37.5 MG 24 hr capsule Take 2 capsules (75 mg total) by mouth daily with breakfast. 2 pills in AM 03/11/15  Yes Jessica U Vann, DO  ipratropium (ATROVENT) 0.02 % nebulizer solution Take 2.5 mLs (0.5 mg total) by nebulization every 4 (four) hours as needed for wheezing or shortness of breath. Patient not taking: Reported on 03/29/2015 03/11/15   Geradine Girt, DO  levalbuterol (XOPENEX) 0.63 MG/3ML nebulizer solution Take 3 mLs (0.63 mg total) by nebulization every 4  (four) hours as needed for wheezing or shortness of breath. Patient not taking: Reported on 03/29/2015 03/11/15   Geradine Girt, DO  tamsulosin (FLOMAX) 0.4 MG CAPS capsule Take 1 capsule (0.4 mg total) by mouth daily. Patient not taking: Reported on 03/29/2015 03/11/15   Geradine Girt, DO   No Known Allergies CBC:    Component Value Date/Time   WBC 11.6* 04/01/2015 0351   HGB 10.6* 04/01/2015 0351   HCT 33.1* 04/01/2015 0351   PLT 333 04/01/2015 0351   MCV 90.7 04/01/2015 0351   NEUTROABS 4.4 03/29/2015 0737   LYMPHSABS 2.8 03/29/2015 0737   MONOABS 1.3* 03/29/2015 0737   EOSABS 1.4* 03/29/2015 0737   BASOSABS 0.1 03/29/2015 0737   Comprehensive Metabolic Panel:    Component Value Date/Time   NA 143 04/01/2015 0351   K 3.8 04/01/2015 0351   CL 105 04/01/2015 0351   CO2 25 04/01/2015 0351   BUN 32* 04/01/2015 0351   CREATININE 0.90 04/01/2015 0351   CREATININE 1.12 01/13/2015 1507   GLUCOSE 121* 04/01/2015 0351   CALCIUM 9.4 04/01/2015 0351   AST 35 03/31/2015 0305   ALT 12* 03/31/2015 0305   ALKPHOS 89 03/31/2015 0305   BILITOT 1.6* 03/31/2015 0305   PROT 6.5 03/31/2015 0305   ALBUMIN 3.1* 03/31/2015 0305    Physical Exam: Vital Signs: BP 130/73 mmHg  Pulse 52  Temp(Src) 98 F (36.7 C) (Axillary)  Resp 18  Ht 5\' 7"  (1.702 m)  Wt 64.6 kg (142 lb 6.7 oz)  BMI 22.30 kg/m2  SpO2 96% SpO2: SpO2: 96 % (Simultaneous filing. User may not have seen previous data.) O2 Device: O2 Device: Not Delivered (Simultaneous filing. User may not have seen previous data.) O2 Flow Rate: O2 Flow Rate (L/min): 2 L/min Intake/output summary:  Intake/Output Summary (Last 24 hours) at 04/02/15 1437 Last data filed at 04/02/15 1108  Gross per 24 hour  Intake    720 ml  Output    225 ml  Net    495 ml   LBM:  5/30 Baseline Weight: Weight: 66.3 kg (146 lb 2.6 oz) Most recent weight: Weight: 64.6 kg (142 lb 6.7 oz)  Exam Findings:  Gen: Slightly distressed, sitting up in bed HEENT:  Temporal muscle wasting CV: Irreg Pulm: Scattered rhonchi Extremities: MAE, no edema Neuro: Awake, alert, confused  Palliative Performance Scale: 30%               Additional Data Reviewed: Recent Labs     03/31/15  0305  04/01/15  0351  WBC  19.3*  11.6*  HGB  10.6*  10.6*  PLT  394  333  NA  142  143  BUN  31*  32*  CREATININE  1.09  0.90     Time In: 1400 Time Out: 1520 Time Total: 79min  Greater than 50%  of this time was spent counseling and coordinating care related to the above assessment and plan.  Signed by: Pershing Proud, NP  Pershing Proud, NP  03/08/8501, 2:37 PM  Please contact Palliative Medicine Team phone at [phone] for questions and concerns.

## 2015-04-02 NOTE — Progress Notes (Addendum)
ANTIBIOTIC CONSULT NOTE - FOLLOW UP  Pharmacy Consult for Vancomycin and Cefepime Indication: aspiration PNA  No Known Allergies  Patient Measurements: Height: 5\' 7"  (170.2 cm) Weight: 142 lb 6.7 oz (64.6 kg) IBW/kg (Calculated) : 66.1  Vital Signs: Temp: 97.9 F (36.6 C) (06/01 0549) Temp Source: Axillary (06/01 0549) BP: 138/51 mmHg (06/01 1021) Pulse Rate: 95 (06/01 1021) Intake/Output from previous day: 05/31 0701 - 06/01 0700 In: 746 [P.O.:596; IV Piggyback:150] Out: 225 [Urine:225] Intake/Output from this shift: Total I/O In: 600 [P.O.:600] Out: -   Labs:  Recent Labs  03/31/15 0305 04/01/15 0351  WBC 19.3* 11.6*  HGB 10.6* 10.6*  PLT 394 333  CREATININE 1.09 0.90   Estimated Creatinine Clearance: 51.8 mL/min (by C-G formula based on Cr of 0.9).  Recent Labs  04/02/15 1230  [place] [facility]     Microbiology: Recent Results (from the past 720 hour(s))  Culture, blood (routine x 2)     Status: None   Collection Time: 03/08/15  9:37 AM  Result Value Ref Range Status   Specimen Description BLOOD RIGHT WRIST  Final   Special Requests BOTTLES DRAWN AEROBIC AND ANAEROBIC 5CC  Final   Culture   Final    NO GROWTH 5 DAYS Performed at [facility]    Report Status 03/14/2015 FINAL  Final  Culture, blood (routine x 2)     Status: None   Collection Time: 03/08/15  9:45 AM  Result Value Ref Range Status   Specimen Description BLOOD RIGHT ARM  Final   Special Requests BOTTLES DRAWN AEROBIC AND ANAEROBIC 10ML  Final   Culture   Final    NO GROWTH 5 DAYS Performed at [facility]    Report Status 03/14/2015 FINAL  Final  Urine culture     Status: None   Collection Time: 03/08/15 10:10 AM  Result Value Ref Range Status   Specimen Description URINE, CATHETERIZED  Final   Special Requests NONE  Final   Colony Count NO GROWTH Performed at [facility]   Final   Culture NO GROWTH Performed at [facility]   Final   Report  Status 03/09/2015 FINAL  Final  MRSA PCR Screening     Status: None   Collection Time: 03/08/15  3:30 PM  Result Value Ref Range Status   MRSA by PCR NEGATIVE NEGATIVE Final    Comment:        The GeneXpert MRSA Assay (FDA approved for NASAL specimens only), is one component of a comprehensive MRSA colonization surveillance program. It is not intended to diagnose MRSA infection nor to guide or monitor treatment for MRSA infections.   Urine culture     Status: None   Collection Time: 03/25/15  7:48 PM  Result Value Ref Range Status   Specimen Description URINE, CLEAN CATCH  Final   Special Requests NONE  Final   Colony Count NO GROWTH Performed at [facility]   Final   Culture NO GROWTH Performed at [facility]   Final   Report Status 03/27/2015 FINAL  Final  Blood culture (routine x 2)     Status: None   Collection Time: 03/29/15  4:15 AM  Result Value Ref Range Status   Specimen Description BLOOD LEFT ARM  Final   Special Requests BOTTLES DRAWN AEROBIC AND ANAEROBIC 10CC  Final   Culture   Final    STAPHYLOCOCCUS SPECIES (COAGULASE NEGATIVE) Note: THE SIGNIFICANCE OF ISOLATING THIS ORGANISM FROM A  SINGLE SET OF BLOOD CULTURES WHEN MULTIPLE SETS ARE DRAWN IS UNCERTAIN. PLEASE NOTIFY THE MICROBIOLOGY DEPARTMENT WITHIN ONE WEEK IF SPECIATION AND SENSITIVITIES ARE REQUIRED. Note: Gram Stain Report Called to,Read Back By and Verified With: Judie Bonus RN on 03/30/15 at 02:35 by Rise Mu Performed at [facility]    Report Status 03/31/2015 FINAL  Final  Blood culture (routine x 2)     Status: None (Preliminary result)   Collection Time: 03/29/15  4:20 AM  Result Value Ref Range Status   Specimen Description BLOOD RIGHT ARM  Final   Special Requests BOTTLES DRAWN AEROBIC ONLY 10CC  Final   Culture   Final    GRAM POSITIVE COCCI IN CLUSTERS Note: Gram Stain Report Called to,Read Back By and Verified With:  Fallis ESTE @ 54AM ON 04/02/15  ROHAL Performed at [facility]    Report Status PENDING  Incomplete  MRSA PCR Screening     Status: None   Collection Time: 03/29/15  9:15 AM  Result Value Ref Range Status   MRSA by PCR NEGATIVE NEGATIVE Final    Comment:        The GeneXpert MRSA Assay (FDA approved for NASAL specimens only), is one component of a comprehensive MRSA colonization surveillance program. It is not intended to diagnose MRSA infection nor to guide or monitor treatment for MRSA infections.     Anti-infectives    Start     Dose/Rate Route Frequency Ordered Stop   03/30/15 1200  ceFEPIme (MAXIPIME) 2 g in dextrose 5 % 50 mL IVPB     2 g 100 mL/hr over 30 Minutes Intravenous Every 24 hours 03/29/15 1200     03/29/15 1300  vancomycin (VANCOCIN) 500 mg in sodium chloride 0.9 % 100 mL IVPB     500 mg 100 mL/hr over 60 Minutes Intravenous Every 12 hours 03/29/15 1202     03/29/15 1130  ceFEPIme (MAXIPIME) 2 g in dextrose 5 % 50 mL IVPB     2 g 100 mL/hr over 30 Minutes Intravenous  Once 03/29/15 1122 03/29/15 1246   03/29/15 1130  vancomycin (VANCOCIN) IVPB 1000 mg/200 mL premix  Status:  Discontinued     1,000 mg 200 mL/hr over 60 Minutes Intravenous  Once 03/29/15 1122 03/29/15 1125   03/29/15 0700  vancomycin (VANCOCIN) IVPB 1000 mg/200 mL premix     1,000 mg 200 mL/hr over 60 Minutes Intravenous  Once 03/29/15 0658 03/29/15 0813   03/29/15 0700  piperacillin-tazobactam (ZOSYN) IVPB 3.375 g     3.375 g 100 mL/hr over 30 Minutes Intravenous  Once 03/29/15 7654 03/29/15 0743      Assessment: 81 yo M presented to ED 5/28 with increase cough and work of breathing.  Pt started on Vancomycin and Cefepime for empiric HCAP coverage.  Now more likely aspiration PNA.  Today is day #5 of IV abx.  WBC and pro-calcitonin trending down.  Infectious Disease:sepsis/HCAP. Sepsis 2/2 RUL HCAP / Asp PNA -  Afebrile, WBC now trending down, LA 2.03 > 1.10.    5/28 Blood cx > 1/2 CNS, 1/2 GPC  5/28  Zosyn >> 5/28 5/28 Cefepime >> 5/28 Vancomycin >>  Goal of Therapy:  Vancomycin trough level 15-20 mcg/ml  Plan:  Change vancomycin to 750 mg IV q12h Continue Cefepime 2gm IV q24h Follow-up abx plan, LOT.  Consider narrowing abx (d/c Vanc). If Vanc continues, will need to check a trough level in next 24-48 hours.  Thank you for allowing  pharmacy to be a part of this patients care team.  Todd [name] Pharm.D., BCPS, AQ-Cardiology Clinical Pharmacist 04/02/2015 1:33 PM Pager: [phone] Phone: [phone]  04/02/2015 2:40 PM Decision to change from cefeipme to zosyn due to medication supply issues, follow up current cultures and narrow therapy as clinically indicated. Todd [name] C

## 2015-04-02 NOTE — Progress Notes (Signed)
Made MD aware that patient has frequent cough but can not produce anything.  I have suctioned the back of his throat with only a scant amount of secretions coming out. Dr. Cruzita Lederer has ordered prn chest pt and told to continue robitossin. Will continue to monitor.  Sandre Kitty

## 2015-04-02 NOTE — Care Management Note (Signed)
Case Management Note  Patient Details  Name: Todd [name] MRN: [id number] Date of Birth: 07/25/1924  Subjective/Objective:              PNA      Action/Plan: SNF Expected Discharge Date:       04/04/2015           Expected Discharge Plan:  [facility]  In-House Referral:  Clinical Social Work  Discharge planning Services         Status of Service:  Completed, signed off  Medicare Important Message Given:  Yes Date Medicare IM Given:    Medicare IM give by:    Date Additional Medicare IM Given:  04/02/15 Additional Medicare Important Message give by:  Jonnie Finner RN CCM  If discussed at Long Length of Stay Meetings, dates discussed:    Additional Comments: Scheduled dc to SNF when medically stable.  Erenest Rasher, RN 04/02/2015, 3:40 PM

## 2015-04-02 NOTE — Discharge Instructions (Signed)
Information on my medicine - XARELTO (Rivaroxaban)  This medication education was reviewed with me or my healthcare representative as part of my discharge preparation.  The pharmacist that spoke with me during my hospital stay was:  Tomeca Helm, [place], [place]  Why was Xarelto prescribed for you? Xarelto was prescribed for you to reduce the risk of a blood clot forming that can cause a stroke if you have a medical condition called atrial fibrillation (a type of irregular heartbeat).  What do you need to know about xarelto ? Take your Xarelto ONCE DAILY at the same time every day with your evening meal. If you have difficulty swallowing the tablet whole, you may crush it and mix in applesauce just prior to taking your dose.  Take Xarelto exactly as prescribed by your doctor and DO NOT stop taking Xarelto without talking to the doctor who prescribed the medication.  Stopping without other stroke prevention medication to take the place of Xarelto may increase your risk of developing a clot that causes a stroke.  Refill your prescription before you run out.  After discharge, you should have regular check-up appointments with your healthcare provider that is prescribing your Xarelto.  In the future your dose may need to be changed if your kidney function or weight changes by a significant amount.  What do you do if you miss a dose? If you are taking Xarelto ONCE DAILY and you miss a dose, take it as soon as you remember on the same day then continue your regularly scheduled once daily regimen the next day. Do not take two doses of Xarelto at the same time or on the same day.   Important Safety Information A possible side effect of Xarelto is bleeding. You should call your healthcare provider right away if you experience any of the following: ? Bleeding from an injury or your nose that does not stop. ? Unusual colored urine (red or dark brown) or unusual colored stools (red or  black). ? Unusual bruising for unknown reasons. ? A serious fall or if you hit your head (even if there is no bleeding).  Some medicines may interact with Xarelto and might increase your risk of bleeding while on Xarelto. To help avoid this, consult your healthcare provider or pharmacist prior to using any new prescription or non-prescription medications, including herbals, vitamins, non-steroidal anti-inflammatory drugs (NSAIDs) and supplements.  This website has more information on Xarelto: [email or web address]   Antibiotic Medication Antibiotic medicine helps fight germs. Germs cause infections. This type of medicine will not work for colds, flu, or other viral infections. Tell your doctor if you:  Are allergic to any medicines.  Are pregnant or are trying to get pregnant.  Are taking other medicines.  Have other medical problems. HOME CARE  Take your medicine with a glass of water or food as told by your doctor.  Take the medicine as told. Finish them even if you start to feel better.  Do not give your medicine to other people.  Do not use your medicine in the future for a different infection.  Ask your doctor about which side effects to watch for.  Try not to miss any doses. If you miss a dose, take it as soon as possible. If it is almost time for your next dose, and your dosing schedule is:  Two doses a day, take the missed dose and the next dose 5 to 6 hours later.  Three or more doses a day, take the missed  dose and the next dose 2 to 4 hours later, or double your next dose.  Then go back to your normal schedule. GET HELP RIGHT AWAY IF:   You get worse or do not get better within a few days.  The medicine makes you sick.  You develop a rash or any other side effects.  You have questions or concerns. MAKE SURE YOU:  Understand these instructions.  Will watch your condition.  Will get help right away if you are not doing well or get worse. Document Released:  07/27/2008 Document Revised: 01/10/2012 Document Reviewed: 09/23/2009 [facility] Patient Information 2015 [place], [place]. This information is not intended to replace advice given to you by your health care provider. Make sure you discuss any questions you have with your health care provider.

## 2015-04-03 NOTE — Progress Notes (Signed)
Daily Progress Note   Patient Name: Todd [name]       Date: 04/03/2015 DOB: 11-15-1923  Age: 81 y.o. MRN#: [id number] Attending Physician: Caren Griffins, MD Primary Care Physician: Odette Fraction, MD Admit Date: 03/29/2015   Subjective: I received call back from [place] and met her in her father's room. He is awake and sitting up smiling in bed with no distress - [name] is happy to see him in this condition. I updated her that per CSW we believe that he may be able to return to [facility] for rehab and I believe this is her choice of venue at discharge. We did discuss transition to home with hospice or hospice facility with decline. [name] has no questions/concerns at this time. I also gave [employer] booklet.    Length of Stay: 5 days  Current Medications: Scheduled Meds:  . albuterol  2.5 mg Nebulization Q6H  . feeding supplement (ENSURE ENLIVE)  237 mL Oral BID BM  . guaiFENesin  600 mg Oral 4 times per day  . metoprolol tartrate  25 mg Oral BID  . mometasone-formoterol  2 puff Inhalation BID  . pantoprazole  40 mg Oral Daily  . piperacillin-tazobactam (ZOSYN)  IV  3.375 g Intravenous 3 times per day  . pravastatin  20 mg Oral Daily  . predniSONE  30 mg Oral Q breakfast  . rivaroxaban  15 mg Oral Q supper  . vancomycin  750 mg Intravenous Q12H    Continuous Infusions:    PRN Meds: acetaminophen, food thickener, haloperidol lactate, levalbuterol, ondansetron **OR** ondansetron (ZOFRAN) IV  Palliative Performance Scale: 30%     Vital Signs: BP 158/59 mmHg  Pulse 56  Temp(Src) 98 F (36.7 C) (Oral)  Resp 20  Ht $R'5\' 7"'Xq$  (1.702 m)  Wt 63.7 kg (140 lb 6.9 oz)  BMI 21.99 kg/m2  SpO2 100% SpO2: SpO2: 100 % O2 Device: O2 Device: Not Delivered O2 Flow Rate: O2 Flow Rate (L/min): 2 L/min  Intake/output summary:  Intake/Output Summary (Last 24 hours) at 04/03/15 1823 Last data filed at 04/03/15 1239  Gross per 24 hour  Intake    540 ml  Output    841 ml    Net   -301 ml   LBM:  6/2 Baseline Weight: Weight: 66.3 kg (146 lb 2.6 oz) Most recent weight: Weight: 63.7 kg (140 lb 6.9 oz)  Physical Exam: Gen: NAD, sitting up in bed HEENT: Temporal muscle wasting CV: Irreg Pulm: No labored breathing Extremities: MAE, no edema Neuro: Awake, alert, confused         Additional Data Reviewed: Recent Labs     04/01/15  0351  WBC  11.6*  HGB  10.6*  PLT  333  NA  143  BUN  32*  CREATININE  0.90     Problem List:  Patient Active Problem List   Diagnosis Date Noted  . Palliative care encounter 04/02/2015  . Dyspnea 04/02/2015  . Dysphagia 04/02/2015  . DNR (do not resuscitate) 04/02/2015  . COPD (chronic obstructive pulmonary disease) 03/29/2015  . NSVT (nonsustained ventricular tachycardia) 03/29/2015  . COPD exacerbation 03/13/2015  . Anemia, iron deficiency 03/13/2015  . HCAP (healthcare-associated pneumonia) 03/08/2015  . Acute encephalopathy 03/08/2015  . Protein-calorie malnutrition, severe 02/06/2015  . Weakness 02/05/2015  . CAP (community acquired pneumonia) 02/05/2015  . Failure to thrive in adult 02/05/2015  . Fall 02/05/2015  . Pancreatitis, gallstone   . Cholecystitis, acute   . Elevated LFTs   .  Preoperative cardiovascular examination   . Acute pancreatitis   . Pancreatitis 09/05/2014  . Hypothyroidism 09/05/2014  . Gallstone pancreatitis 11/23/2013  . Chronic diastolic CHF (congestive heart failure) 10/28/2011  . Pneumonia 09/06/2011  . Sepsis 09/06/2011  . Chest pain 09/06/2011  . Chronic atrial fibrillation 09/06/2011  . Acute respiratory failure with hypoxia 09/06/2011  . PNEUMONIA, ORGANISM UNSPECIFIED 03/16/2010  . EDEMA 06/17/2008  . HLD (hyperlipidemia) 01/24/2008  . ALLERGIC RHINITIS 01/24/2008  . Asthma 01/24/2008  . BRONCHIECTASIS 01/24/2008  . Nonspecific (abnormal) findings on radiological and other examination of body structure 01/24/2008  . COLON CANCER 01/24/2008  . CHEST XRAY, ABNORMAL  01/24/2008     Palliative Care Assessment & Plan    Code Status:  DNR  Goals of Care:  Focus is comfort but treat the treatable. Continue antibiotics. Not full comfort care.   3. Symptom Management:  Dyspnea: May need low dose oxygen for comfort (2L) and may utilize roxanol 2.5-5 mg prn. Improved today.   Congestion/secretions: Agree with continuing guaifenesin and nebulizers.   5. Prognosis: < 6 months likely  5. Discharge Planning: [place] for rehab with Palliative care service follow-up   Thank you for allowing the Palliative Medicine Team to assist in the care of this patient.   Time In: 1620 Time Out: 1640 Total Time 70min Prolonged Time Billed  no     Greater than 50%  of this time was spent counseling and coordinating care related to the above assessment and plan.   Pershing Proud, NP  0/07/8118, 6:23 PM  Please contact Palliative Medicine Team phone at [phone] for questions and concerns.

## 2015-04-03 NOTE — Progress Notes (Addendum)
Utilization review complete. Hilton Saephan RN CCM Case Mgmt phone [phone] 

## 2015-04-03 NOTE — Progress Notes (Signed)
[facility] PROGRESS NOTE Interim History: 81 yo male discharged to SNF for rehab stay on 5/10 after admit for COPD exacerbation and CAP. History of chronic A. fib on Xarelto, hypertension, iron deficiency anemia, hypothyroidism, chronic DHF, and biliary pancreatitis status post cholecystectomy. Since discharge PNA sxs apparently resolved but patient never returned to his former baseline w/ poor oral intake. Sent from SNF due to cough with congestion and respiratory distress. Per EMS hemodynamically stable but O2 sats 95% on 6L. Observed with increased work of breathing. Upon arrival to ER patient with continued increased work of breathing and tachypnea. ABG with PO2 70 on 6L. given nebs with some improvement but not resolution of symptoms. CT chest revealed extensive bronchitis and mucoid impaction and likely bronchopneumonia right upper lobe as well as acute T12 comp fx. Patient is DO NOT RESUSCITATE. WBC 9,900, Hg 11.2, initial lactic acid 2.03 but decr to 1.10 after treatment and fluids, electrolyte panel normal except for glucose 164, K+ 3.7  HPI/Subjective: - no chest pain, shortness of breath, no abdominal pain, nausea or vomiting.  - eating breakfast   Assessment/Plan: Sepsis likely due to HCAP vs aspiration pneumonia - patient evaluated by SLP, recommended dysphagia 3 (mechanical soft and nectar thick liquid) - continue HCAP coverage with vancomycin and zosyn. Will change cefepime to zosyn given pharmacy shortage  Positive blood cultures - cultures obtained on 5/28 showed coag negative staph speciated on 5/28 in 1 set and the second set is now positive as of 6/1 with GPC in clusters - continue vancomycin, await speciation still - repeat blood cultures in am pending, negative so far - goals of care to be determined, palliative involved  Acute respiratory failure with hypoxia due to aspiration: - See above, CT imaging was done that was negative for PE, oxygen requirements  continues to be minimal.  COPD: - No wheezing with crackles on the right - transition to po prednisone  Chronic diastolic heart failure: - Seems to be euvolemic continue daily weights.  Chronic atrial fibrillation: - Continue Xarelto  - rate controlled  Hypothyroidism: - Continue Synthroid.  Severe protein caloric malnutrition:  Acute 12 compression fracture noted on CT: - Patient is asymptomatic.   Code Status: DNR Family Communication: none present  DispositiPossible :  SNF when stable   Consultants:  none  Procedures:  CT chest  Antibiotics:  Vanc 5.28>>  Zosyn 2.28>>5.29  Cefepime 5.28>>   Objective: Filed Vitals:   04/03/15 0619 04/03/15 0731 04/03/15 0808 04/03/15 1312  BP:  138/44  158/59  Pulse:  48  43  Temp:  97.8 F (36.6 C)  98 F (36.7 C)  TempSrc:  Oral  Oral  Resp:  18  22  Height:      Weight: 63.7 kg (140 lb 6.9 oz)     SpO2:  99% 97% 100%    Intake/Output Summary (Last 24 hours) at 04/03/15 1348 Last data filed at 04/03/15 1239  Gross per 24 hour  Intake    980 ml  Output    841 ml  Net    139 ml   Filed Weights   03/31/15 0420 04/02/15 0549 04/03/15 0619  Weight: 66.5 kg (146 lb 9.7 oz) 64.6 kg (142 lb 6.7 oz) 63.7 kg (140 lb 6.9 oz)   Exam: General: Alert, awake, in no acute distress, hard of hearing, confused Heart: irregular, no MRG, no edema Lungs: Good air movement, no wheezing Abdomen: Soft, nontender, nondistended, positive bowel sounds.  Neuro: Grossly  intact, nonfocal.   Data Reviewed: Basic Metabolic Panel:  Recent Labs Lab 03/29/15 0420 03/29/15 0428 03/29/15 0737 03/30/15 0350 03/31/15 0305 04/01/15 0351  NA 139 140 138 142 142 143  K 3.7 3.7 3.6 3.7 5.1 3.8  CL 103 102 101 109 110 105  CO2 26  --  21* 24 23 25   GLUCOSE 163* 164* 135* 218* 144* 121*  BUN 11 13 12 16  31* 32*  CREATININE 0.88 0.80 0.92 0.98 1.09 0.90  CALCIUM 9.2  --  8.8* 9.6 9.6 9.4  MG  --   --  1.9  --  2.1  --    Liver  Function Tests:  Recent Labs Lab 03/29/15 0737 03/30/15 0350 03/31/15 0305  AST 18 18 35  ALT 10* 11* 12*  ALKPHOS 106 98 89  BILITOT 0.4 0.5 1.6*  PROT 6.7 7.0 6.5  ALBUMIN 3.3* 3.2* 3.1*   CBC:  Recent Labs Lab 03/29/15 0428 03/29/15 0737 03/30/15 0350 03/31/15 0305 04/01/15 0351  WBC  --  9.9 3.3* 19.3* 11.6*  NEUTROABS  --  4.4  --   --   --   HGB 12.9* 11.2* 11.3* 10.6* 10.6*  HCT 38.0* 34.2* 34.5* 32.5* 33.1*  MCV  --  90.0 89.6 90.0 90.7  PLT  --  365 383 394 333   BNP (last 3 results)  Recent Labs  02/05/15 0105 03/29/15 0422  BNP 144.1* 327.5*    ProBNP (last 3 results)  Recent Labs  09/06/14 0033  PROBNP 1947.0*     Recent Results (from the past 240 hour(s))  Urine culture     Status: None   Collection Time: 03/25/15  7:48 PM  Result Value Ref Range Status   Specimen Description URINE, CLEAN CATCH  Final   Special Requests NONE  Final   Colony Count NO GROWTH Performed at [facility]   Final   Culture NO GROWTH Performed at [facility]   Final   Report Status 03/27/2015 FINAL  Final  Blood culture (routine x 2)     Status: None   Collection Time: 03/29/15  4:15 AM  Result Value Ref Range Status   Specimen Description BLOOD LEFT ARM  Final   Special Requests BOTTLES DRAWN AEROBIC AND ANAEROBIC 10CC  Final   Culture   Final    STAPHYLOCOCCUS SPECIES (COAGULASE NEGATIVE) Note: THE SIGNIFICANCE OF ISOLATING THIS ORGANISM FROM A SINGLE SET OF BLOOD CULTURES WHEN MULTIPLE SETS ARE DRAWN IS UNCERTAIN. PLEASE NOTIFY THE MICROBIOLOGY DEPARTMENT WITHIN ONE WEEK IF SPECIATION AND SENSITIVITIES ARE REQUIRED. Note: Gram Stain Report Called to,Read Back By and Verified With: Judie Bonus RN on 03/30/15 at 02:35 by Rise Mu Performed at [facility]    Report Status 03/31/2015 FINAL  Final  Blood culture (routine x 2)     Status: None (Preliminary result)   Collection Time: 03/29/15  4:20 AM  Result Value Ref Range  Status   Specimen Description BLOOD RIGHT ARM  Final   Special Requests BOTTLES DRAWN AEROBIC ONLY 10CC  Final   Culture   Final    GRAM POSITIVE COCCI IN CLUSTERS Note: Gram Stain Report Called to,Read Back By and Verified With:  [place] ESTE @ [id number] ON 04/02/15 ROHAL Performed at [facility]    Report Status PENDING  Incomplete  MRSA PCR Screening     Status: None   Collection Time: 03/29/15  9:15 AM  Result Value Ref Range Status   MRSA by  PCR NEGATIVE NEGATIVE Final    Comment:        The GeneXpert MRSA Assay (FDA approved for NASAL specimens only), is one component of a comprehensive MRSA colonization surveillance program. It is not intended to diagnose MRSA infection nor to guide or monitor treatment for MRSA infections.      Studies: No results found.  Scheduled Meds: . albuterol  2.5 mg Nebulization Q6H  . feeding supplement (ENSURE ENLIVE)  237 mL Oral BID BM  . guaiFENesin  600 mg Oral 4 times per day  . metoprolol tartrate  25 mg Oral BID  . mometasone-formoterol  2 puff Inhalation BID  . pantoprazole  40 mg Oral Daily  . piperacillin-tazobactam (ZOSYN)  IV  3.375 g Intravenous 3 times per day  . pravastatin  20 mg Oral Daily  . predniSONE  30 mg Oral Q breakfast  . rivaroxaban  15 mg Oral Q supper  . vancomycin  750 mg Intravenous Q12H   Continuous Infusions:   Marzetta Board  [facility] Pager [phone]. If 7PM-7AM, please contact night-coverage at [email or web address], password [facility] 04/03/2015, 1:48 PM  LOS: 5 days

## 2015-04-03 NOTE — Progress Notes (Signed)
    Todd [name] appears more relaxed today and more comfortable. He is not distressed. I called daughter, Todd [name], but she was in an appointment and could not speak. I attempted her again this afternoon and left voicemail - I will continue to follow up tomorrow. Todd [name] was considering options for:  - Home with hospice vs return to SNF (with palliative to bridge) - Considering continuing antibiotic therapies in the future/rehospitalization or focusing on full comfort care  Vinie Sill, NP Palliative Medicine Team Pager # [phone] (M-F 8a-5p) Team Phone # [phone] (Nights/Weekends)

## 2015-04-04 LAB — CULTURE, BLOOD (ROUTINE X 2)

## 2015-04-04 MED ORDER — ALBUTEROL SULFATE (2.5 MG/3ML) 0.083% IN NEBU
2.5000 mg | INHALATION_SOLUTION | Freq: Three times a day (TID) | RESPIRATORY_TRACT | Status: DC
  Administered 2015-04-04 – 2015-04-05 (×4): 2.5 mg via RESPIRATORY_TRACT
  Filled 2015-04-04 (×4): qty 3

## 2015-04-04 MED ORDER — PREDNISONE 10 MG PO TABS
10.0000 mg | ORAL_TABLET | Freq: Every day | ORAL | Status: DC
  Administered 2015-04-05: 10 mg via ORAL
  Filled 2015-04-04 (×2): qty 1

## 2015-04-04 MED ORDER — LEVOFLOXACIN 500 MG PO TABS
500.0000 mg | ORAL_TABLET | Freq: Every day | ORAL | Status: DC
  Administered 2015-04-04 – 2015-04-05 (×2): 500 mg via ORAL
  Filled 2015-04-04 (×2): qty 1

## 2015-04-04 NOTE — Progress Notes (Signed)
CSW contacted [facility] to inform of likely DC back to their facility tomorrow (04/05/15).  CSW will continue to follow.  Domenica Reamer, [place] Social Worker [phone]

## 2015-04-04 NOTE — Progress Notes (Signed)
Daily Progress Note   Patient Name: Todd [name]       Date: 04/04/2015 DOB: [birth year]  Age: 81 y.o. MRN#: [id number] Attending Physician: Caren Griffins, MD Primary Care Physician: Odette Fraction, MD Admit Date: 03/29/2015  Subjective:    Todd [name] is sleepy today. Daughter, [name], is at bedside. She reminisces about her mother and good memories of her parents together and how happy they were (married 63 yrs). [name] is sad thinking that her father may be approaching end of life as her mother died 34 and her brother died from alcoholism February 23, 2013. She feels better prepared on what to expect after what she has been through. Support provided. MOST introduced and discussed but she would like to think about this and I recommend palliative to follow at SNF.    Length of Stay: 6 days  Current Medications: Scheduled Meds:  . albuterol  2.5 mg Nebulization TID  . feeding supplement (ENSURE ENLIVE)  237 mL Oral BID BM  . guaiFENesin  600 mg Oral 4 times per day  . levofloxacin  500 mg Oral Daily  . metoprolol tartrate  25 mg Oral BID  . mometasone-formoterol  2 puff Inhalation BID  . pantoprazole  40 mg Oral Daily  . pravastatin  20 mg Oral Daily  . [START ON 04/05/2015] predniSONE  10 mg Oral Q breakfast  . rivaroxaban  15 mg Oral Q supper    Continuous Infusions:    PRN Meds: acetaminophen, food thickener, haloperidol lactate, levalbuterol, ondansetron **OR** ondansetron (ZOFRAN) IV  Palliative Performance Scale: 30%     Vital Signs: BP 137/60 mmHg  Pulse 58  Temp(Src) 97.6 F (36.4 C) (Oral)  Resp 18  Ht 5\' 7"  (1.702 m)  Wt 64.3 kg (141 lb 12.1 oz)  BMI 22.20 kg/m2  SpO2 99% SpO2: SpO2: 99 % O2 Device: O2 Device: Not Delivered O2 Flow Rate: O2 Flow Rate (L/min): 2 L/min  Intake/output summary:  Intake/Output Summary (Last 24 hours) at 04/04/15 1804 Last data filed at 04/04/15 0700  Gross per 24 hour  Intake    700 ml  Output    600 ml  Net    100 ml   LBM:  Last BM Date: 04/03/15 Baseline Weight: Weight: 66.3 kg (146 lb 2.6 oz) Most recent weight: Weight: 64.3 kg (141 lb 12.1 oz)  Physical Exam: Gen: NAD, sitting up in bed HEENT: Temporal muscle wasting CV: Irreg Pulm: No labored breathing Extremities: MAE, no edema Neuro: Awake, alert, confused    Additional Data Reviewed: No results for input(s): WBC, HGB, PLT, NA, BUN, CREATININE, ALB in the last 72 hours.   Problem List:  Patient Active Problem List   Diagnosis Date Noted  . Palliative care encounter 04/02/2015  . Dyspnea 04/02/2015  . Dysphagia 04/02/2015  . DNR (do not resuscitate) 04/02/2015  . COPD (chronic obstructive pulmonary disease) 03/29/2015  . NSVT (nonsustained ventricular tachycardia) 03/29/2015  . COPD exacerbation 03/13/2015  . Anemia, iron deficiency 03/13/2015  . HCAP (healthcare-associated pneumonia) 03/08/2015  . Acute encephalopathy 03/08/2015  . Protein-calorie malnutrition, severe 02/06/2015  . Weakness 02/05/2015  . CAP (community acquired pneumonia) 02/05/2015  . Failure to thrive in adult 02/05/2015  . Fall 02/05/2015  . Pancreatitis, gallstone   . Cholecystitis, acute   . Elevated LFTs   . Preoperative cardiovascular examination   . Acute pancreatitis   . Pancreatitis 09/05/2014  . Hypothyroidism 09/05/2014  . Gallstone pancreatitis 11/23/2013  . Chronic diastolic CHF (congestive heart failure)  10/28/2011  . Pneumonia 09/06/2011  . Sepsis 09/06/2011  . Chest pain 09/06/2011  . Chronic atrial fibrillation 09/06/2011  . Acute respiratory failure with hypoxia 09/06/2011  . PNEUMONIA, ORGANISM UNSPECIFIED 03/16/2010  . EDEMA 06/17/2008  . HLD (hyperlipidemia) 01/24/2008  . ALLERGIC RHINITIS 01/24/2008  . Asthma 01/24/2008  . BRONCHIECTASIS 01/24/2008  . Nonspecific (abnormal) findings on radiological and other examination of body structure 01/24/2008  . COLON CANCER 01/24/2008  . CHEST XRAY, ABNORMAL 01/24/2008     Palliative  Care Assessment & Plan    Code Status:  DNR  Goals of Care:  Focus is comfort but treat the treatable.   3. Symptom Management:  Dyspnea: May need low dose oxygen for comfort (2L) and may utilize roxanol 2.5-5 mg prn. Improved today.   Congestion/secretions: Agree with continuing guaifenesin and nebulizers.   5. Prognosis: < 6 months likely  5. Discharge Planning: [facility] for rehab with Palliative care service follow-up   Thank you for allowing the Palliative Medicine Team to assist in the care of this patient.   Time In: 1420 Time Out: 1500 Total Time 56min Prolonged Time Billed  no     Greater than 50%  of this time was spent counseling and coordinating care related to the above assessment and plan.   Vinie Sill, NP Palliative Medicine Team Pager # [phone] (M-F 8a-5p) Team Phone # [phone] (Nights/Weekends)  04/04/2015, 6:04 PM

## 2015-04-04 NOTE — Progress Notes (Signed)
Speech Language Pathology Treatment: Dysphagia  Patient Details Name: Todd [name] MRN: [id number] DOB: 05-08-1924 Today's Date: 04/04/2015 Time: 7829-5621 SLP Time Calculation (min) (ACUTE ONLY): 12 min  Assessment / Plan / Recommendation Clinical Impression  Pt was seen during breakfast meal for f/u dysphagia treatment. He is sleepy this morning, requiring Max cues for alertness. When alert, pt accepted advanced trials of ice chips and water by spoon with delayed cough and wet respirations noted. Pt consumed several bites of puree, however lethargy limited his intake in terms of both quantity and safety. PO trials were held when pt began showing delayed coughing with purees. Overall he remains afebrile with lung sounds unchanged. Would continue current diet textures when maximally alert.   HPI Other Pertinent Information: This is a 81 yo male recent discharge 5/10 after tx for COPD exacerbation and CAP. DC to SNF for rehab on D3 diet. History of chronic A. fib on Xarelto, hypertension, iron deficiency anemia, hypothyroidism chronic DHF and history biliary pancreatitis status post cholecystectomy. Sent from SNF 2/2 cough with congestion and respiratory distress. CT chest revealed extensive bronchitis and mucoid impaction and likely bronchopneumonia right upper lobe as well as acute T12 comp fx.   Pertinent Vitals Pain Assessment: Faces Faces Pain Scale: No hurt  SLP Plan  Continue with current plan of care    Recommendations Diet recommendations: Dysphagia 3 (mechanical soft);Nectar-thick liquid Liquids provided via: Cup;No straw Medication Administration: Crushed with puree Supervision: Patient able to self feed;Full supervision/cueing for compensatory strategies Compensations: Clear throat intermittently;Slow rate;Small sips/bites;Multiple dry swallows after each bite/sip Postural Changes and/or Swallow Maneuvers: Seated upright 90 degrees       Oral Care Recommendations: Oral care  BID Follow up Recommendations: Skilled Nursing facility Plan: Continue with current plan of care    Germain Osgood, M.A. CCC-SLP [phone]  Germain Osgood 04/04/2015, 10:18 AM

## 2015-04-04 NOTE — Progress Notes (Signed)
[facility] PROGRESS NOTE Interim History: 81 yo male discharged to SNF for rehab stay on 5/10 after admit for COPD exacerbation and CAP. History of chronic A. fib on Xarelto, hypertension, iron deficiency anemia, hypothyroidism, chronic DHF, and biliary pancreatitis status post cholecystectomy. Since discharge PNA sxs apparently resolved but patient never returned to his former baseline w/ poor oral intake. Sent from SNF due to cough with congestion and respiratory distress. Per EMS hemodynamically stable but O2 sats 95% on 6L. Observed with increased work of breathing. Upon arrival to ER patient with continued increased work of breathing and tachypnea. ABG with PO2 70 on 6L. given nebs with some improvement but not resolution of symptoms. CT chest revealed extensive bronchitis and mucoid impaction and likely bronchopneumonia right upper lobe as well as acute T12 comp fx. Patient is DO NOT RESUSCITATE. WBC 9,900, Hg 11.2, initial lactic acid 2.03 but decr to 1.10 after treatment and fluids, electrolyte panel normal except for glucose 164, K+ 3.7  HPI/Subjective: - no chest pain, shortness of breath, no abdominal pain, nausea or vomiting.  - eating breakfast   Assessment/Plan: Sepsis likely due to HCAP vs aspiration pneumonia - patient evaluated by SLP, recommended dysphagia 3 (mechanical soft and nectar thick liquid) - HCAP coverage with vancomycin and zosyn 5/28 >> 6/3, narrow to Levaquin  Positive blood cultures - cultures obtained on 5/28 showed coag negative staph speciated on 5/28 in 1 set and the second set is now positive as of 6/1 with GPC in clusters speciation to micrococcus. I discussed with ID, these are likely contaminant - continue vancomycin, await speciation still - repeat blood cultures in am pending, negative so far  Goals of care - appreciate palliative involvement, d/c to SNF tomorrow if stable and daughter will re-evaluate for hospice  Acute respiratory  failure with hypoxia due to aspiration: - See above, CT imaging was done that was negative for PE - resolved, now on room air  COPD: - No wheezing with crackles on the right - transition to po prednisone, taper to 10 mg tomorrow  Chronic diastolic heart failure: - Seems to be euvolemic continue daily weights.  Chronic atrial fibrillation: - Continue Xarelto  - rate controlled  Hypothyroidism: - Continue Synthroid.  Severe protein caloric malnutrition:  Acute 12 compression fracture noted on CT: - Patient is asymptomatic.   Code Status: DNR Family Communication: discussed with daughter [name] bedside DispositiPossible :  SNF when stable   Consultants:  none  Procedures:  CT chest  Antibiotics:  Vanc 5.28>>6/3  Zosyn 2.28>>5.29  Cefepime 5.28>>6/3  Levofloxacin 6/3 >>   Objective: Filed Vitals:   04/04/15 0700 04/04/15 0723 04/04/15 1000 04/04/15 1426  BP:      Pulse:   53   Temp:      TempSrc:      Resp:      Height:      Weight: 64.3 kg (141 lb 12.1 oz)     SpO2:  98%  98%    Intake/Output Summary (Last 24 hours) at 04/04/15 1429 Last data filed at 04/04/15 0700  Gross per 24 hour  Intake    700 ml  Output    600 ml  Net    100 ml   Filed Weights   04/02/15 0549 04/03/15 0619 04/04/15 0700  Weight: 64.6 kg (142 lb 6.7 oz) 63.7 kg (140 lb 6.9 oz) 64.3 kg (141 lb 12.1 oz)   Exam: General: Alert, awake, in no acute distress, hard of  hearing, eating breakfast  Heart: irregular, no MRG, no edema Lungs: Good air movement, no wheezing Abdomen: Soft, nontender, nondistended, positive bowel sounds.  Neuro: Grossly intact, nonfocal.   Data Reviewed: Basic Metabolic Panel:  Recent Labs Lab 03/29/15 0420 03/29/15 0428 03/29/15 0737 03/30/15 0350 03/31/15 0305 04/01/15 0351  NA 139 140 138 142 142 143  K 3.7 3.7 3.6 3.7 5.1 3.8  CL 103 102 101 109 110 105  CO2 26  --  21* 24 23 25   GLUCOSE 163* 164* 135* 218* 144* 121*  BUN 11 13 12 16   31* 32*  CREATININE 0.88 0.80 0.92 0.98 1.09 0.90  CALCIUM 9.2  --  8.8* 9.6 9.6 9.4  MG  --   --  1.9  --  2.1  --    Liver Function Tests:  Recent Labs Lab 03/29/15 0737 03/30/15 0350 03/31/15 0305  AST 18 18 35  ALT 10* 11* 12*  ALKPHOS 106 98 89  BILITOT 0.4 0.5 1.6*  PROT 6.7 7.0 6.5  ALBUMIN 3.3* 3.2* 3.1*   CBC:  Recent Labs Lab 03/29/15 0428 03/29/15 0737 03/30/15 0350 03/31/15 0305 04/01/15 0351  WBC  --  9.9 3.3* 19.3* 11.6*  NEUTROABS  --  4.4  --   --   --   HGB 12.9* 11.2* 11.3* 10.6* 10.6*  HCT 38.0* 34.2* 34.5* 32.5* 33.1*  MCV  --  90.0 89.6 90.0 90.7  PLT  --  365 383 394 333   BNP (last 3 results)  Recent Labs  02/05/15 0105 03/29/15 0422  BNP 144.1* 327.5*    ProBNP (last 3 results)  Recent Labs  09/06/14 0033  PROBNP 1947.0*     Recent Results (from the past 240 hour(s))  Urine culture     Status: None   Collection Time: 03/25/15  7:48 PM  Result Value Ref Range Status   Specimen Description URINE, CLEAN CATCH  Final   Special Requests NONE  Final   Colony Count NO GROWTH Performed at [facility]   Final   Culture NO GROWTH Performed at [facility]   Final   Report Status 03/27/2015 FINAL  Final  Blood culture (routine x 2)     Status: None   Collection Time: 03/29/15  4:15 AM  Result Value Ref Range Status   Specimen Description BLOOD LEFT ARM  Final   Special Requests BOTTLES DRAWN AEROBIC AND ANAEROBIC 10CC  Final   Culture   Final    STAPHYLOCOCCUS SPECIES (COAGULASE NEGATIVE) Note: THE SIGNIFICANCE OF ISOLATING THIS ORGANISM FROM A SINGLE SET OF BLOOD CULTURES WHEN MULTIPLE SETS ARE DRAWN IS UNCERTAIN. PLEASE NOTIFY THE MICROBIOLOGY DEPARTMENT WITHIN ONE WEEK IF SPECIATION AND SENSITIVITIES ARE REQUIRED. Note: Gram Stain Report Called to,Read Back By and Verified With: Judie Bonus RN on 03/30/15 at 02:35 by Rise Mu Performed at [facility]    Report Status 03/31/2015 FINAL  Final    Blood culture (routine x 2)     Status: None   Collection Time: 03/29/15  4:20 AM  Result Value Ref Range Status   Specimen Description BLOOD RIGHT ARM  Final   Special Requests BOTTLES DRAWN AEROBIC ONLY 10CC  Final   Culture   Final    MICROCOCCUS SPECIES Note: Standardized susceptibility testing for this organism is not available. Note: Gram Stain Report Called to,Read Back By and Verified With:  [place] ESTE @ [id number] ON 04/02/15 ROHAL Performed at [facility]    Report  Status 04/04/2015 FINAL  Final  MRSA PCR Screening     Status: None   Collection Time: 03/29/15  9:15 AM  Result Value Ref Range Status   MRSA by PCR NEGATIVE NEGATIVE Final    Comment:        The GeneXpert MRSA Assay (FDA approved for NASAL specimens only), is one component of a comprehensive MRSA colonization surveillance program. It is not intended to diagnose MRSA infection nor to guide or monitor treatment for MRSA infections.   Culture, blood (routine x 2)     Status: None (Preliminary result)   Collection Time: 04/03/15  5:43 AM  Result Value Ref Range Status   Specimen Description BLOOD RIGHT ANTECUBITAL  Final   Special Requests BOTTLES DRAWN AEROBIC AND ANAEROBIC 10CC EA  Final   Culture   Final           BLOOD CULTURE RECEIVED NO GROWTH TO DATE CULTURE WILL BE HELD FOR 5 DAYS BEFORE ISSUING A FINAL NEGATIVE REPORT Note: Culture results may be compromised due to an excessive volume of blood received in culture bottles. Performed at [facility]    Report Status PENDING  Incomplete  Culture, blood (routine x 2)     Status: None (Preliminary result)   Collection Time: 04/03/15  8:00 AM  Result Value Ref Range Status   Specimen Description BLOOD RIGHT ANTECUBITAL  Final   Special Requests BOTTLES DRAWN AEROBIC ONLY [place]  Final   Culture   Final           BLOOD CULTURE RECEIVED NO GROWTH TO DATE CULTURE WILL BE HELD FOR 5 DAYS BEFORE ISSUING A FINAL NEGATIVE REPORT Performed at  [facility]    Report Status PENDING  Incomplete     Studies: No results found.  Scheduled Meds: . albuterol  2.5 mg Nebulization TID  . feeding supplement (ENSURE ENLIVE)  237 mL Oral BID BM  . guaiFENesin  600 mg Oral 4 times per day  . metoprolol tartrate  25 mg Oral BID  . mometasone-formoterol  2 puff Inhalation BID  . pantoprazole  40 mg Oral Daily  . piperacillin-tazobactam (ZOSYN)  IV  3.375 g Intravenous 3 times per day  . pravastatin  20 mg Oral Daily  . predniSONE  30 mg Oral Q breakfast  . rivaroxaban  15 mg Oral Q supper  . vancomycin  750 mg Intravenous Q12H   Continuous Infusions:   Time spent: 25 minutes, > 50% with family discussions   Marzetta Board  [facility] Pager [phone]. If 7PM-7AM, please contact night-coverage at [email or web address], password [facility] 04/04/2015, 2:29 PM  LOS: 6 days

## 2015-04-04 NOTE — Progress Notes (Signed)
Pt remain stable during shift; no restlessness or attempt to get OOB. Pt comfortably in bed with call light within reach with bed alarm on. Reported off to incoming RN. Francis Gaines Evaleigh Mccamy RN.

## 2015-04-05 MED ORDER — LEVOFLOXACIN 500 MG PO TABS: 500.0000 mg | ORAL_TABLET | Freq: Every day | ORAL | Status: DC

## 2015-04-05 MED ORDER — PREDNISONE 10 MG PO TABS: 10.0000 mg | ORAL_TABLET | Freq: Every day | ORAL | Status: DC

## 2015-04-05 NOTE — Progress Notes (Signed)
Pt discharge education completed; report called off to [place] at [facility]. Pt discharge to SNF with [facility] to transport pt off to disposition. Pt IV and telemetry removed. Pt cleaned and dressed in personal clothing daughter brought from home to pt. Pt sitting up in chair with call light within reach. Will closely monitor pt till pick up. P. Amo Esperanza Madrazo RN.

## 2015-04-05 NOTE — Progress Notes (Signed)
[facility] picked pt up to transport to disposition. Pt transported off unit via stretcher with belongings to the side. Francis Gaines Caral Whan RN.

## 2015-04-05 NOTE — Clinical Social Work Note (Addendum)
Per MD patient ready to DC back to [facility]. RN, patient/family [name]), and facility notified of patient's DC. RN given number for report. DC packet on patient's chart. Ambulance transport requested for patient for 2:30PM. CSW signing off at this   Liz Beach, MSW, [place], [place] [phone] time.

## 2015-04-05 NOTE — Discharge Summary (Signed)
Physician Discharge Summary  Todd [name] NKN:[id number] DOB: 05/03/1924 DOA: 03/29/2015  PCP: Todd Fraction, MD  Admit date: 03/29/2015 Discharge date: 04/05/2015  Time spent: > 30 minutes  Recommendations for Outpatient Follow-up:  1. Follow up with Dr. Dennard [name] in 1-2 weeks  2. Continue Levofloxacin for 3 additional days 3. Continue Prednisone for 3 additional days at 10 mg daily  4. Dysphagia 3 (mechanical soft) diet with Nectar-thick liquid  Discharge Diagnoses:  Principal Problem:   Sepsis Active Problems:   HLD (hyperlipidemia)   Chronic atrial fibrillation   Acute respiratory failure with hypoxia   Chronic diastolic CHF (congestive heart failure)   Hypothyroidism   Protein-calorie malnutrition, severe   HCAP (healthcare-associated pneumonia)   Anemia, iron deficiency   COPD (chronic obstructive pulmonary disease)   NSVT (nonsustained ventricular tachycardia)   Palliative care encounter   Dyspnea   Dysphagia   DNR (do not resuscitate)   Discharge Condition: stable  Diet recommendation: Dysphagia 3 (mechanical soft);Nectar-thick liquid  Filed Weights   04/03/15 0619 04/04/15 0700 04/05/15 0500  Weight: 63.7 kg (140 lb 6.9 oz) 64.3 kg (141 lb 12.1 oz) 64.2 kg (141 lb 8.6 oz)    History of present illness:  This is a 81 yo male recent discharge 5/10 after tx for COPD exacerbation and CAP. DC to SNF for rehab on D3 diet. History of chronic A. fib on Xarelto, hypertension, iron deficiency anemia, hypothyroidism chronic DHF and history biliary pancreatitis status post cholecystectomy. Patient unable to provide history so obtained from grandson via phone. Since discharge PNA sxs apparently resolved but patient never back to baseline-poor oral intake and continued dysmotility. Sent from SNF 2/2 cough with congestion and respiratory distress. Per EMS hemodynamically stable but O2 sats 95% on 6L.Marland Kitchen Observed with increased work of breathing. Upon arrival to ER patient  with continued increased work of breathing and tachypnea. ABG with PO2 70 on 6L. given nebs with some improvement but not resolution of symptoms. CT chest revealed extensive bronchitis and mucoid impaction and likely bronchopneumonia right upper lobe as well as acute T12 comp fx. Patient is DO NOT RESUSCITATE. WBC 9,900, Hg 11.2, initial lactic acid 2.03 but decr to 1.10 after treatment and fluids, electrolyte panel normal except for glucose 164, K+ 3.7  Hospital Course:  Sepsis likely due to HCAP vs aspiration pneumonia - recurrent, patient evaluated by SLP and recommended dysphagia 3 (mechanical soft and nectar thick liquid). Patient seems to be doing well with this diet under supervision. He was on broad spectrum antibiotics for HCAP coverage with vancomycin and zosyn from 5/28 >> 6/3, his antibiotics were narrow to Levaquin and will need 3 additional days on discharge. Positive blood cultures - cultures obtained on admission, 5/28, showed coag negative staph speciated on 5/28 in 1 set and the second set was also positive as of 6/1 with micrococcus. I have called and discussed with Infectious Disease, these are likely contaminant. Repeat cultures have remained negative.  Acute respiratory failure with hypoxia due to aspiration- See above, CT imaging was done that was negative for PE. This has resolved, patient is now on room air COPD - wheezing on admission, he was treated with IV steroids, transitioned to prednisone, tapered now to 10 mg daily, will need 3 additional days.  Chronic diastolic heart failure - Seems to be euvolemic continue daily weights. Chronic atrial fibrillation: - Continue Xarelto, rate controlled Hypothyroidism - Continue Synthroid. Severe protein caloric malnutrition Acute 12 compression fracture noted on CT - Patient  is asymptomatic.  Procedures:  None    Consultations:  Palliative care  Discharge Exam: Filed Vitals:   04/04/15 1947 04/04/15 2029 04/05/15 0415  04/05/15 0500  BP: 124/59  150/83   Pulse: 55 68 58   Temp: 98.6 F (37 C)  97.7 F (36.5 C)   TempSrc: Oral  Oral   Resp: 18 18 18    Height:      Weight:    64.2 kg (141 lb 8.6 oz)  SpO2: 96%  99%    General: NAD Cardiovascular: RRR Respiratory: CTA biL  Discharge Instructions    Medication List    TAKE these medications        ADVAIR DISKUS 500-50 MCG/DOSE Aepb  Generic drug:  Fluticasone-Salmeterol  INHALE 1 PUFF TWICE A DAY     feeding supplement (ENSURE ENLIVE) Liqd  Take 237 mLs by mouth 2 (two) times daily between meals.     ferrous sulfate 325 (65 FE) MG tablet  TAKE 1 TABLET BY MOUTH EVERY DAY     fluticasone 50 MCG/ACT nasal spray  Commonly known as:  FLONASE  Place 2 sprays into both nostrils daily.     guaiFENesin 200 MG tablet  Take 200 mg by mouth every 4 (four) hours as needed for cough or to loosen phlegm.     ipratropium 0.02 % nebulizer solution  Commonly known as:  ATROVENT  Take 2.5 mLs (0.5 mg total) by nebulization every 4 (four) hours as needed for wheezing or shortness of breath.     ipratropium-albuterol 0.5-2.5 (3) MG/3ML Soln  Commonly known as:  DUONEB  Take 3 mLs by nebulization every 6 (six) hours as needed (SOB/Wheezing).     levalbuterol 0.63 MG/3ML nebulizer solution  Commonly known as:  XOPENEX  Take 3 mLs (0.63 mg total) by nebulization every 4 (four) hours as needed for wheezing or shortness of breath.     levofloxacin 500 MG tablet  Commonly known as:  LEVAQUIN  Take 1 tablet (500 mg total) by mouth daily. For 3 more days     levothyroxine 25 MCG tablet  Commonly known as:  SYNTHROID, LEVOTHROID  TAKE 1 TABLET BY MOUTH EVERY DAY     losartan 50 MG tablet  Commonly known as:  COZAAR  Take 50 mg by mouth daily.     omeprazole 40 MG capsule  Commonly known as:  PRILOSEC  Take 40 mg by mouth daily.     pravastatin 20 MG tablet  Commonly known as:  PRAVACHOL  Take 20 mg by mouth daily.     predniSONE 10 MG tablet    Commonly known as:  DELTASONE  Take 1 tablet (10 mg total) by mouth daily with breakfast. For 3 more days     rivaroxaban 20 MG Tabs tablet  Commonly known as:  XARELTO  Take 1 tablet (20 mg total) by mouth daily with supper.     tamsulosin 0.4 MG Caps capsule  Commonly known as:  FLOMAX  Take 1 capsule (0.4 mg total) by mouth daily.     venlafaxine XR 37.5 MG 24 hr capsule  Commonly known as:  EFFEXOR XR  Take 2 capsules (75 mg total) by mouth daily with breakfast. 2 pills in AM           Follow-up Information    Follow up with [facility] TOM, MD. Schedule an appointment as soon as possible for a visit in 1 week.   Specialty:  Family Medicine   Contact information:  [address] [place] [address] [phone]       The results of significant diagnostics from this hospitalization (including imaging, microbiology, ancillary and laboratory) are listed below for reference.    Significant Diagnostic Studies: Dg Chest 2 View  03/25/2015   CLINICAL DATA:  Respiratory distress  EXAM: CHEST  2 VIEW  COMPARISON:  03/08/2015  FINDINGS: Cardiac shadow is mildly enlarged but stable. The left lung is clear. Improved aeration is noted in the right lung base when compare with the prior study. Diffuse interstitial changes are again noted stable from the prior study. No bony abnormality is seen.  IMPRESSION: Stable interstitial changes.  No acute infiltrate is seen.   Electronically Signed   By: Inez Catalina M.D.   On: 03/25/2015 18:57   Ct Head Wo Contrast  03/08/2015   CLINICAL DATA:  Pt not able to communicate unable to straighten headThis morning he was not easily arousable and so he was sent over to the hospital for evaluation. Patient is unable to provide any history due to his encephalopathy.  EXAM: CT HEAD WITHOUT CONTRAST  TECHNIQUE: Contiguous axial images were obtained from the base of the skull through the vertex without intravenous contrast.  COMPARISON:   02/04/2015  FINDINGS: Ventricles normal configuration. There is ventricular and sulcal enlargement reflecting moderate atrophy. No hydrocephalus.  Smaller area of encephalomalacia in the left occipital lobe. There is hypoattenuation and small areas of encephalomalacia in the left posterior frontal lobe adjacent parietal lobe. These findings are stable reflecting old infarcts.  No parenchymal masses or mass effect. There is no evidence of a recent transcortical infarct.  There are no extra-axial masses or abnormal fluid collections.  No intracranial hemorrhage.  Visualized sinuses and mastoid air cells are clear.  IMPRESSION: 1. No acute intracranial abnormalities. 2. No change from the prior exam.  Atrophy and old infarcts.   Electronically Signed   By: Lajean Manes M.D.   On: 03/08/2015 12:41   Ct Angio Chest Pe W/cm &/or Wo Cm  03/29/2015   CLINICAL DATA:  Congestion and cough.  Hypoxia.  EXAM: CT ANGIOGRAPHY CHEST WITH CONTRAST  TECHNIQUE: Multidetector CT imaging of the chest was performed using the standard protocol during bolus administration of intravenous contrast. Multiplanar CT image reconstructions and MIPs were obtained to evaluate the vascular anatomy.  CONTRAST:  41mL OMNIPAQUE IOHEXOL 350 MG/ML SOLN  COMPARISON:  01/21/2011  FINDINGS: THORACIC INLET/BODY WALL:  No acute abnormality.  MEDIASTINUM:  Normal heart size. No pericardial effusion. Extensive atherosclerosis, including the coronary arteries. Limited aortic opacification. No evidence of acute aortic syndrome. Subsegmental pulmonary arteries are intermittently distorted by respiratory motion, but the exam is overall diagnostic and negative for pulmonary embolism. No adenopathy.  LUNG WINDOWS:  Diffuse bronchial wall thickening and bronchial impaction. Reticular markings in the bilateral lungs is stable from 2012. There is however reticular nodular airspace disease in the right upper lobe. A 9 mm nodule in the right upper lobe on image 49 is  stable from 2012.  UPPER ABDOMEN:  Presumed 3 cm cyst in the upper pole left kidney.  OSSEOUS:  T12 compression fracture with readily visible fracture plane. Height loss is approximately 50% and there is no retropulsion. Compression fractures of T4, T5, T6, T9 appear chronic  Review of the MIP images confirms the above findings.  IMPRESSION: 1. Negative for pulmonary embolism. 2. Extensive bronchitis and mucoid impaction. There could be early bronchopneumonia in the right upper lobe (versus scarring which  is seen diffusely). 3. Acute T12 compression fracture with moderate height loss.   Electronically Signed   By: Monte Fantasia M.D.   On: 03/29/2015 06:53   Dg Chest Port 1 View  03/31/2015   CLINICAL DATA:  Ammonia in shortness of Breath  EXAM: PORTABLE CHEST - 1 VIEW  COMPARISON:  03/30/2015  FINDINGS: Cardiac shadow is enlarged but stable. The lungs are well aerated bilaterally. Some slight increase in the degree of right basilar atelectasis is noted. Patchy stable changes are noted particularly in the right upper lobe. No focal confluent infiltrate is seen.  IMPRESSION: Slight increase in the degree of right basilar atelectasis.  Stable patchy changes similar to that seen on the previous day.   Electronically Signed   By: Inez Catalina M.D.   On: 03/31/2015 07:32   Portable Chest 1 View  03/30/2015   CLINICAL DATA:  Followup healthcare associated pneumonia.  EXAM: PORTABLE CHEST - 1 VIEW  COMPARISON:  03/29/2015  FINDINGS: Hazy airspace opacity described in the right upper lobe on the previous day's study is unchanged. There is persistent bilateral interstitial thickening. Peribronchial thickening is noted in the medial lung bases and perihilar regions also unchanged. Probable small effusions. No pneumothorax.  Cardiac silhouette is normal in size. No mediastinal or hilar masses.  IMPRESSION: 1. No change from the previous day's study. 2. Mild hazy right upper lobe opacity again noted, which may reflect  residual pneumonia. Bilateral interstitial thickening and perihilar and medial lung base peribronchial thickening is also stable, and appears chronic and unchanged from more remote chest radiographs.   Electronically Signed   By: Lajean Manes M.D.   On: 03/30/2015 07:18   Dg Chest Port 1 View  03/29/2015   CLINICAL DATA:  Shortness of breath. Sepsis. History of asthma and COPD.  EXAM: PORTABLE CHEST - 1 VIEW  COMPARISON:  03/29/2015 and chest CT 03/29/2015  FINDINGS: Lungs are adequately inflated with mild hazy airspace opacification over the right upper lobe which may be due to developing pneumonia. No evidence of effusion. Mild stable cardiomegaly. Calcified plaque is present over the aortic arch. Remainder of the exam is unchanged.  IMPRESSION: Mild hazy airspace opacification over the right upper lobe which may represent developing infection.  Mild cardiomegaly.   Electronically Signed   By: Marin Olp M.D.   On: 03/29/2015 08:23   Dg Chest Portable 1 View  03/29/2015   CLINICAL DATA:  Cough and congestion.  Shortness of breath  EXAM: PORTABLE CHEST - 1 VIEW  COMPARISON:  03/25/2015  FINDINGS: Stable cardiomegaly and aortic tortuosity.  There is a background of hyperinflation and interstitial coarsening with streaky opacity. No definitive change when accounting for differences in technique. No edema, effusion, or pneumothorax. No acute osseous findings.  IMPRESSION: No change from 03/25/2015 to suggest acute disease. Note that the patient's chronic lung disease could obscure early infection.   Electronically Signed   By: Monte Fantasia M.D.   On: 03/29/2015 05:07   Dg Chest Portable 1 View  03/08/2015   CLINICAL DATA:  Code sepsis.Per ED note: Arrives from [facility] via EMS; sent for altered mental status starting yesterday. Usually alert and talks; resting with eyes closed and groans to stimulation. [facility] suspected UTI yesterday and gave Rocephin yesterdayH/o COPD, CHF, HTN, a-fib, asthma,  dementia.  EXAM: PORTABLE CHEST - 1 VIEW  COMPARISON:  02/07/2015  FINDINGS: There is increased opacity at the right lung base when compared the prior study with decreased opacity in  the right upper lobe. There persistent thickened interstitial markings bilaterally. Cardiac silhouette remains mildly enlarged. No pneumothorax.  IMPRESSION: 1. Increased facets right lung base when compared the prior study. This may be due to atelectasis, pneumonia or a combination. 2. Improved opacity previously noted in the right upper lobe. 3. Persistent mild thickening of the interstitial markings without overt pulmonary edema. 4. Mild persistent cardiomegaly.   Electronically Signed   By: Lajean Manes M.D.   On: 03/08/2015 09:53   Dg Swallowing Func-speech Pathology  03/10/2015    Objective Swallowing Evaluation:    Patient Details  Name: GIACOMO VALONE MRN: [id number] Date of Birth: [birth year]  Today's Date: 03/10/2015 Time: SLP Start Time (ACUTE ONLY): 0845-SLP Stop Time (ACUTE ONLY): 0910 SLP Time Calculation (min) (ACUTE ONLY): 25 min  Past Medical History:  Past Medical History  Diagnosis Date  . Asthma   . Hypertension   . A-fib   . Hyperlipidemia   . Shortness of breath   . CHF (congestive heart failure) 10/28/2011  . Forgetfulness   . PVC's (premature ventricular contractions)   . Mild aortic insufficiency   . Dementia     [name] 02/05/2015  . Failure to thrive in adult     /notes 02/05/2015  . COPD (chronic obstructive pulmonary disease)   . Hypothyroidism     [name] 02/05/2015  . Fall 02/04/2015    with altered mental status off of his baseline after he suffered a  mechanical fall /notes 02/04/2015  . CAP (community acquired pneumonia)     [name] 02/05/2015  . Colon cancer    Past Surgical History:  Past Surgical History  Procedure Laterality Date  . Colon surgery      Partial hemecolectomy   . Central line insertion  09/08/2011        HPI:  Other Pertinent Information: This is a 81 year old Caucasian male who was  recently  hospitalized and treated for community-acquired pneumonia. He was  discharged to a skilled nursing facility. Comes in with altered mental  status, low-grade fever and perhaps a cough. Chest X-ray reveals possible  infiltrate in the right lower lung. Other possibilities for his altered  mental status include hypercapnia. Meningitis appears to be unlikely based  on examination and other data. Dehydration could also be contributing..   Patient has had previous swallow work up in 2012 with rx for nectar  liquids due to silent aspiration of thins.  The patient reports that he  just stopped using thickened liquids.  No Data Recorded  Assessment / Plan / Recommendation CHL IP CLINICAL IMPRESSIONS 03/10/2015  Therapy Diagnosis (None)  Clinical Impression Pt presents with mild motor and sensory deficits  impacting safety with liquid textures. Pts oral phase is mildly impaired,  but functional. Primary deficits include delayed swallow with silent  penetration of thin and nectar thick liquids before and during the  swallow, impacted largely by bolus size and rate of intake. When taking  small single sips with head in neutral pt able to tolerate thin and nectar  sips without signficant aspiration. A cued throat clear and second swallow  would decrease risk of penetration and residue. Recommend pt consume thin  liquids and dys 3 (mechanical soft) diet with supervision for safe intake.  Again texture of liquids is not as important as bolus size/aspiration  precautions for this pt; supervision needed.       CHL IP TREATMENT RECOMMENDATION 03/10/2015  Treatment Recommendations Therapy as outlined in treatment plan below  CHL IP DIET RECOMMENDATION 03/10/2015  SLP Diet Recommendations Dysphagia 3 (Mech soft);Thin  Liquid Administration via (None)  Medication Administration Whole meds with puree  Compensations Clear throat intermittently;Slow rate;Small  sips/bites;Multiple dry swallows after each bite/sip  Postural Changes and/or  Swallow Maneuvers (None)     CHL IP OTHER RECOMMENDATIONS 03/10/2015  Recommended Consults (None)  Oral Care Recommendations Oral care BID  Other Recommendations (None)     No flowsheet data found.   CHL IP FREQUENCY AND DURATION 03/10/2015  Speech Therapy Frequency (ACUTE ONLY) (None)  Treatment Duration 2 weeks     Pertinent Vitals/Pain NA    SLP Swallow Goals CHL IP SWALLOW STUDY GOALS 09/11/2011  Patient will consume recommended diet without observed clinical signs of  aspiration with Minimal assistance  Swallow Study Goal #1 - Progress (None)  Patient will utilize recommended strategies during swallow to increase  swallowing safety with Minimal assistance  Swallow Study Goal #2 - Progress (None)  Goal #3 (None)  Swallow Study Goal #3 - Progress (None)  Goal #4 (None)  Swallow Study Goal #4 - Progress (None)    No flowsheet data found.    CHL IP REASON FOR REFERRAL 03/10/2015  Reason for Referral Objectively evaluate swallowing function     CHL IP ORAL PHASE 03/10/2015  Lips (None)  Tongue (None)  Mucous membranes (None)  Nutritional status (None)  Other (None)  Oxygen therapy (None)  Oral Phase Impaired  Oral - Pudding Teaspoon (None)  Oral - Pudding Cup (None)  Oral - Honey Teaspoon (None)  Oral - Honey Cup (None)  Oral - Honey Syringe (None)  Oral - Nectar Teaspoon (None)  Oral - Nectar Cup (None)  Oral - Nectar Straw (None)  Oral - Nectar Syringe (None)  Oral - Ice Chips (None)  Oral - Thin Teaspoon (None)  Oral - Thin Cup (None)  Oral - Thin Straw (None)  Oral - Thin Syringe (None)  Oral - Puree (None)  Oral - Mechanical Soft (None)  Oral - Regular (None)  Oral - Multi-consistency (None)  Oral - Pill (None)  Oral Phase - Comment (None)      CHL IP PHARYNGEAL PHASE 03/10/2015  Pharyngeal Phase Impaired  Pharyngeal - Pudding Teaspoon (None)  Penetration/Aspiration details (pudding teaspoon) (None)  Pharyngeal - Pudding Cup (None)  Penetration/Aspiration details (pudding cup) (None)  Pharyngeal - Honey Teaspoon (None)   Penetration/Aspiration details (honey teaspoon) (None)  Pharyngeal - Honey Cup (None)  Penetration/Aspiration details (honey cup) (None)  Pharyngeal - Honey Syringe (None)  Penetration/Aspiration details (honey syringe) (None)  Pharyngeal - Nectar Teaspoon (None)  Penetration/Aspiration details (nectar teaspoon) (None)  Pharyngeal - Nectar Cup (None)  Penetration/Aspiration details (nectar cup) (None)  Pharyngeal - Nectar Straw (None)  Penetration/Aspiration details (nectar straw) (None)  Pharyngeal - Nectar Syringe (None)  Penetration/Aspiration details (nectar syringe) (None)  Pharyngeal - Ice Chips (None)  Penetration/Aspiration details (ice chips) (None)  Pharyngeal - Thin Teaspoon (None)  Penetration/Aspiration details (thin teaspoon) (None)  Pharyngeal - Thin Cup (None)  Penetration/Aspiration details (thin cup) (None)  Pharyngeal - Thin Straw (None)  Penetration/Aspiration details (thin straw) (None)  Pharyngeal - Thin Syringe (None)  Penetration/Aspiration details (thin syringe') (None)  Pharyngeal - Puree (None)  Penetration/Aspiration details (puree) (None)  Pharyngeal - Mechanical Soft (None)  Penetration/Aspiration details (mechanical soft) (None)  Pharyngeal - Regular (None)  Penetration/Aspiration details (regular) (None)  Pharyngeal - Multi-consistency (None)  Penetration/Aspiration details (multi-consistency) (None)  Pharyngeal - Pill (None)  Penetration/Aspiration details (pill) (None)  Pharyngeal  Comment (None)      CHL IP CERVICAL ESOPHAGEAL PHASE 09/13/2011  Cervical Esophageal Phase (None)  Pudding Teaspoon (None)  Pudding Cup (None)  Honey Teaspoon (None)  Honey Cup (None)  Honey Straw (None)  Nectar Teaspoon (None)  Nectar Cup (None)  Nectar Straw (None)  Nectar Sippy Cup (None)  Thin Teaspoon (None)  Thin Cup (None)  Thin Straw (None)  Thin Sippy Cup (None)  Cervical Esophageal Comment Severe residue on UES post-swallow of  puree/solids    No flowsheet data found.  Herbie [place], [place] CCC-SLP  [phone]        Lynann Beaver 03/10/2015, 10:39 AM     Microbiology: Recent Results (from the past 240 hour(s))  Blood culture (routine x 2)     Status: None   Collection Time: 03/29/15  4:15 AM  Result Value Ref Range Status   Specimen Description BLOOD LEFT ARM  Final   Special Requests BOTTLES DRAWN AEROBIC AND ANAEROBIC 10CC  Final   Culture   Final    STAPHYLOCOCCUS SPECIES (COAGULASE NEGATIVE) Note: THE SIGNIFICANCE OF ISOLATING THIS ORGANISM FROM A SINGLE SET OF BLOOD CULTURES WHEN MULTIPLE SETS ARE DRAWN IS UNCERTAIN. PLEASE NOTIFY THE MICROBIOLOGY DEPARTMENT WITHIN ONE WEEK IF SPECIATION AND SENSITIVITIES ARE REQUIRED. Note: Gram Stain Report Called to,Read Back By and Verified With: Judie Bonus RN on 03/30/15 at 02:35 by Rise Mu Performed at [facility]    Report Status 03/31/2015 FINAL  Final  Blood culture (routine x 2)     Status: None   Collection Time: 03/29/15  4:20 AM  Result Value Ref Range Status   Specimen Description BLOOD RIGHT ARM  Final   Special Requests BOTTLES DRAWN AEROBIC ONLY 10CC  Final   Culture   Final    MICROCOCCUS SPECIES Note: Standardized susceptibility testing for this organism is not available. Note: Gram Stain Report Called to,Read Back By and Verified With:  Dodge ESTE @ [id number] ON 04/02/15 ROHAL Performed at [facility]    Report Status 04/04/2015 FINAL  Final  MRSA PCR Screening     Status: None   Collection Time: 03/29/15  9:15 AM  Result Value Ref Range Status   MRSA by PCR NEGATIVE NEGATIVE Final    Comment:        The GeneXpert MRSA Assay (FDA approved for NASAL specimens only), is one component of a comprehensive MRSA colonization surveillance program. It is not intended to diagnose MRSA infection nor to guide or monitor treatment for MRSA infections.   Culture, blood (routine x 2)     Status: None (Preliminary result)   Collection Time: 04/03/15  5:43 AM  Result Value Ref Range Status   Specimen  Description BLOOD RIGHT ANTECUBITAL  Final   Special Requests BOTTLES DRAWN AEROBIC AND ANAEROBIC 10CC EA  Final   Culture   Final           BLOOD CULTURE RECEIVED NO GROWTH TO DATE CULTURE WILL BE HELD FOR 5 DAYS BEFORE ISSUING A FINAL NEGATIVE REPORT Note: Culture results may be compromised due to an excessive volume of blood received in culture bottles. Performed at [facility]    Report Status PENDING  Incomplete  Culture, blood (routine x 2)     Status: None (Preliminary result)   Collection Time: 04/03/15  8:00 AM  Result Value Ref Range Status   Specimen Description BLOOD RIGHT ANTECUBITAL  Final   Special Requests BOTTLES DRAWN AEROBIC ONLY [place]  Final  Culture   Final           BLOOD CULTURE RECEIVED NO GROWTH TO DATE CULTURE WILL BE HELD FOR 5 DAYS BEFORE ISSUING A FINAL NEGATIVE REPORT Performed at [facility]    Report Status PENDING  Incomplete     Labs: Basic Metabolic Panel:  Recent Labs Lab 03/30/15 0350 03/31/15 0305 04/01/15 0351  NA 142 142 143  K 3.7 5.1 3.8  CL 109 110 105  CO2 24 23 25   GLUCOSE 218* 144* 121*  BUN 16 31* 32*  CREATININE 0.98 1.09 0.90  CALCIUM 9.6 9.6 9.4  MG  --  2.1  --    Liver Function Tests:  Recent Labs Lab 03/30/15 0350 03/31/15 0305  AST 18 35  ALT 11* 12*  ALKPHOS 98 89  BILITOT 0.5 1.6*  PROT 7.0 6.5  ALBUMIN 3.2* 3.1*   CBC:  Recent Labs Lab 03/30/15 0350 03/31/15 0305 04/01/15 0351  WBC 3.3* 19.3* 11.6*  HGB 11.3* 10.6* 10.6*  HCT 34.5* 32.5* 33.1*  MCV 89.6 90.0 90.7  PLT 383 394 333   BNP: BNP (last 3 results)  Recent Labs  02/05/15 0105 03/29/15 0422  BNP 144.1* 327.5*    ProBNP (last 3 results)  Recent Labs  09/06/14 0033  PROBNP 1947.0*   Signed:  Marzetta Board  [facility] 04/05/2015, 9:12 AM

## 2015-04-07 ENCOUNTER — Non-Acute Institutional Stay (SKILLED_NURSING_FACILITY): Payer: Medicare Other | Admitting: Internal Medicine

## 2015-04-07 ENCOUNTER — Encounter: Payer: Self-pay | Admitting: Internal Medicine

## 2015-04-07 DIAGNOSIS — E43 Unspecified severe protein-calorie malnutrition: Secondary | ICD-10-CM | POA: Diagnosis not present

## 2015-04-07 DIAGNOSIS — E034 Atrophy of thyroid (acquired): Secondary | ICD-10-CM | POA: Diagnosis not present

## 2015-04-07 DIAGNOSIS — J189 Pneumonia, unspecified organism: Secondary | ICD-10-CM

## 2015-04-07 DIAGNOSIS — I482 Chronic atrial fibrillation, unspecified: Secondary | ICD-10-CM

## 2015-04-07 DIAGNOSIS — M4854XD Collapsed vertebra, not elsewhere classified, thoracic region, subsequent encounter for fracture with routine healing: Secondary | ICD-10-CM

## 2015-04-07 DIAGNOSIS — I5032 Chronic diastolic (congestive) heart failure: Secondary | ICD-10-CM

## 2015-04-07 DIAGNOSIS — D509 Iron deficiency anemia, unspecified: Secondary | ICD-10-CM

## 2015-04-07 DIAGNOSIS — S22080D Wedge compression fracture of T11-T12 vertebra, subsequent encounter for fracture with routine healing: Secondary | ICD-10-CM

## 2015-04-07 DIAGNOSIS — J441 Chronic obstructive pulmonary disease with (acute) exacerbation: Secondary | ICD-10-CM | POA: Diagnosis not present

## 2015-04-07 DIAGNOSIS — E038 Other specified hypothyroidism: Secondary | ICD-10-CM

## 2015-04-07 DIAGNOSIS — R1310 Dysphagia, unspecified: Secondary | ICD-10-CM

## 2015-04-07 DIAGNOSIS — A419 Sepsis, unspecified organism: Secondary | ICD-10-CM | POA: Diagnosis not present

## 2015-04-07 DIAGNOSIS — J9601 Acute respiratory failure with hypoxia: Secondary | ICD-10-CM | POA: Diagnosis not present

## 2015-04-07 DIAGNOSIS — S22080A Wedge compression fracture of T11-T12 vertebra, initial encounter for closed fracture: Secondary | ICD-10-CM | POA: Insufficient documentation

## 2015-04-07 NOTE — Assessment & Plan Note (Signed)
2/2 to PNA, either HCAP or aspiration ;CT chest neg ftr PEW

## 2015-04-07 NOTE — Progress Notes (Addendum)
MRN: [id number] Name: Todd [name]  Sex: male Age: 81 y.o. DOB: December 23, 1923  [place] #: Helene Kelp Facility/Room:223 Level Of Care: SNF Provider: Inocencio Homes D Emergency Contacts: Extended Emergency Contact Information Primary Emergency Contact: Seymour Bars [place] Phone: [phone] Relation: Daughter Secondary Emergency Contact: Agapito Games [place] of [place] Mobile Phone: [phone] Relation: Grandson  Code Status:   Allergies: Review of patient's allergies indicates no known allergies.  Chief Complaint  Patient presents with  . New Admit To SNF    HPI: Patient is 81 y.o. male who is admitted to SNF for moderately severe,generalized weakness 2/2 to illness and hospitalization after hosp from 5/28 -6/4  for PNA, HCAP vs aspiration and COPD exacerbation. She will be followed in SNF for HTN, treated with losartan, hyperlipidemia txed with pravachol  and atrial fib txed with xarelto.  Past Medical History  Diagnosis Date  . Asthma   . Hypertension   . A-fib   . Hyperlipidemia   . Shortness of breath   . CHF (congestive heart failure) 10/28/2011  . Forgetfulness   . PVC's (premature ventricular contractions)   . Mild aortic insufficiency   . Dementia     [name] 02/05/2015  . Failure to thrive in adult     /notes 02/05/2015  . COPD (chronic obstructive pulmonary disease)   . Hypothyroidism     [name] 02/05/2015  . Fall 02/04/2015    with altered mental status off of his baseline after he suffered a mechanical fall /notes 02/04/2015  . CAP (community acquired pneumonia)     [name] 02/05/2015  . Colon cancer     Past Surgical History  Procedure Laterality Date  . Colon surgery      Partial hemecolectomy   . Central line insertion  09/08/2011           Medication List       This list is accurate as of: 04/07/15 11:59 PM.  Always use your most recent med list.               ADVAIR DISKUS 500-50 MCG/DOSE Aepb  Generic drug:   Fluticasone-Salmeterol  INHALE 1 PUFF TWICE A DAY     feeding supplement (ENSURE ENLIVE) Liqd  Take 237 mLs by mouth 2 (two) times daily between meals.     ferrous sulfate 325 (65 FE) MG tablet  TAKE 1 TABLET BY MOUTH EVERY DAY     fluticasone 50 MCG/ACT nasal spray  Commonly known as:  FLONASE  Place 2 sprays into both nostrils daily.     guaiFENesin 200 MG tablet  Take 200 mg by mouth every 4 (four) hours as needed for cough or to loosen phlegm.     ipratropium 0.02 % nebulizer solution  Commonly known as:  ATROVENT  Take 2.5 mLs (0.5 mg total) by nebulization every 4 (four) hours as needed for wheezing or shortness of breath.     ipratropium-albuterol 0.5-2.5 (3) MG/3ML Soln  Commonly known as:  DUONEB  Take 3 mLs by nebulization every 6 (six) hours as needed (SOB/Wheezing).     levalbuterol 0.63 MG/3ML nebulizer solution  Commonly known as:  XOPENEX  Take 3 mLs (0.63 mg total) by nebulization every 4 (four) hours as needed for wheezing or shortness of breath.     levofloxacin 500 MG tablet  Commonly known as:  LEVAQUIN  Take 1 tablet (500 mg total) by mouth daily. For 3 more days     levothyroxine 25 MCG tablet  Commonly known as:  SYNTHROID, LEVOTHROID  TAKE 1 TABLET BY MOUTH EVERY DAY     losartan 50 MG tablet  Commonly known as:  COZAAR  Take 50 mg by mouth daily.     omeprazole 40 MG capsule  Commonly known as:  PRILOSEC  Take 40 mg by mouth daily.     pravastatin 20 MG tablet  Commonly known as:  PRAVACHOL  Take 20 mg by mouth daily.     predniSONE 10 MG tablet  Commonly known as:  DELTASONE  Take 1 tablet (10 mg total) by mouth daily with breakfast. For 3 more days     rivaroxaban 20 MG Tabs tablet  Commonly known as:  XARELTO  Take 1 tablet (20 mg total) by mouth daily with supper.     tamsulosin 0.4 MG Caps capsule  Commonly known as:  FLOMAX  Take 1 capsule (0.4 mg total) by mouth daily.     venlafaxine XR 37.5 MG 24 hr capsule  Commonly known  as:  EFFEXOR XR  Take 2 capsules (75 mg total) by mouth daily with breakfast. 2 pills in AM        No orders of the defined types were placed in this encounter.    Immunization History  Administered Date(s) Administered  . Influenza Whole 07/02/2009, 09/10/2011  . Influenza,inj,Quad PF,36+ Mos 08/28/2013, 09/05/2014  . Pneumococcal Conjugate-13 09/05/2014  . Pneumococcal Polysaccharide-23 11/01/2006  . Zoster 08/26/2006    History  Substance Use Topics  . 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 Smokeless tobacco: Not on file     Comment: quit 30+ yrs ago  . Alcohol Use: No    Family history is + HTN, heart dx    Review of Systems  DATA OBTAINED: from patient, nurse GENERAL:  no fevers, fatigue, appetite changes SKIN: No itching, rash or wounds EYES: No eye pain, redness, discharge EARS: No earache, tinnitus, change in hearing NOSE: No congestion, drainage or bleeding  MOUTH/THROAT: No mouth or tooth pain, No sore throat RESPIRATORY: No cough, wheezing, SOB CARDIAC: No chest pain, palpitations, lower extremity edema  GI: No abdominal pain, No N/V/D or constipation, No heartburn or reflux  GU: No dysuria, frequency or urgency, or incontinence  MUSCULOSKELETAL: No unrelieved bone/joint pain NEUROLOGIC: No headache, dizziness or focal weakness PSYCHIATRIC: No overt anxiety or sadness, No behavior issue.   Filed Vitals:   04/08/15 1901  BP: 120/82  Pulse: 82  Temp: 97.9 F (36.6 C)  Resp: 18    Physical Exam  GENERAL APPEARANCE: Alert, No acute distress.  SKIN: No diaphoresis rash HEAD: Normocephalic, atraumatic  EYES: Conjunctiva/lids clear. Pupils round, reactive. EOMs intact.  EARS: External exam WNL, canals clear. Hearing grossly normal.  NOSE: No deformity or discharge.  MOUTH/THROAT: Lips w/o lesions  RESPIRATORY: Breathing is even, unlabored. Lung sounds are decreased, no rales  CARDIOVASCULAR: Heart RRR no murmurs, rubs or gallops. No peripheral edema.    GASTROINTESTINAL: Abdomen is soft, non-tender, not distended w/ normal bowel sounds. GENITOURINARY: Bladder non tender, not distended  MUSCULOSKELETAL: No abnormal joints or musculature NEUROLOGIC:  Cranial nerves 2-12 grossly intact. PSYCHIATRIC: Mood and affect appropriate to situation, no behavioral issues  Patient Active Problem List   Diagnosis Date Noted  . Blistering rash 05/11/2015  . Conjunctivitis 05/11/2015  . Compression fracture of T12 vertebra 04/07/2015  . Palliative care encounter 04/02/2015  . Dyspnea 04/02/2015  . Dysphagia 04/02/2015  . DNR (do not resuscitate) 04/02/2015  . COPD (chronic obstructive pulmonary disease)  03/29/2015  . NSVT (nonsustained ventricular tachycardia) 03/29/2015  . COPD exacerbation 03/13/2015  . Anemia, iron deficiency 03/13/2015  . HCAP (healthcare-associated pneumonia) 03/08/2015  . Acute encephalopathy 03/08/2015  . Protein-calorie malnutrition, severe 02/06/2015  . Weakness 02/05/2015  . CAP (community acquired pneumonia) 02/05/2015  . Failure to thrive in adult 02/05/2015  . Fall 02/05/2015  . Pancreatitis, gallstone   . Cholecystitis, acute   . Elevated LFTs   . Preoperative cardiovascular examination   . Acute pancreatitis   . Pancreatitis 09/05/2014  . Hypothyroidism 09/05/2014  . Gallstone pancreatitis 11/23/2013  . Chronic diastolic CHF (congestive heart failure) 10/28/2011  . Pneumonia 09/06/2011  . Sepsis 09/06/2011  . Chest pain 09/06/2011  . Chronic atrial fibrillation 09/06/2011  . Acute respiratory failure with hypoxia 09/06/2011  . PNEUMONIA, ORGANISM UNSPECIFIED 03/16/2010  . EDEMA 06/17/2008  . HLD (hyperlipidemia) 01/24/2008  . ALLERGIC RHINITIS 01/24/2008  . Asthma 01/24/2008  . BRONCHIECTASIS 01/24/2008  . Nonspecific (abnormal) findings on radiological and other examination of body structure 01/24/2008  . COLON CANCER 01/24/2008  . CHEST XRAY, ABNORMAL 01/24/2008   Ct Head Wo Contrast  04/14/2015    CLINICAL DATA:  Fall today. Confusion. Altered mental status. Involuntary movements.  EXAM: CT HEAD WITHOUT CONTRAST  TECHNIQUE: Contiguous axial images were obtained from the base of the skull through the vertex without intravenous contrast.  COMPARISON:  03/08/2015  FINDINGS: Despite efforts by the technologist and patient, motion artifact is present on today's exam and could not be eliminated. This reduces exam sensitivity and specificity.  Left frontoparietal encephalomalacia, as before. Left occipital lobe encephalomalacia, stable. Otherwise, the brainstem, cerebellum, cerebral peduncles, thalamus, basal ganglia, basilar cisterns, and ventricular system appear within normal limits. No intracranial hemorrhage, mass lesion, or acute CVA.  An air-fluid level with frothy material is noted in the right maxillary sinus the with similar findings in the left sphenoid sinus. Frothy material is present in the right sphenoid sinus.  IMPRESSION: 1. Acute sphenoid and right maxillary sinusitis. 2. Old left frontoparietal vertex encephalomalacia, along with left occipital lobe encephalomalacia. No acute intracranial findings.   Electronically Signed   By: Van Clines M.D.   On: 04/14/2015 21:13   Dg Chest Portable 1 View  04/14/2015   CLINICAL DATA:  Altered mental status.  EXAM: PORTABLE CHEST - 1 VIEW  COMPARISON:  Mar 31, 2015.  FINDINGS: The heart size and mediastinal contours are within normal limits. Both lungs are clear. No pneumothorax or pleural effusion is noted. The visualized skeletal structures are unremarkable.  IMPRESSION: No acute cardiopulmonary abnormality seen.   Electronically Signed   By: Marijo Conception, M.D.   On: 04/14/2015 21:01   CBC    Component Value Date/Time   WBC 7.3 04/14/2015 2000   RBC 3.58* 04/14/2015 2000   RBC 3.42* 09/14/2011 0955   HGB 10.7* 04/14/2015 2000   HCT 32.5* 04/14/2015 2000   PLT 277 04/14/2015 2000   MCV 90.8 04/14/2015 2000   LYMPHSABS 1.2 04/14/2015  2000   MONOABS 0.9 04/14/2015 2000   EOSABS 0.3 04/14/2015 2000   BASOSABS 0.0 04/14/2015 2000    CMP     Component Value Date/Time   NA 137 04/14/2015 2000   K 3.7 04/14/2015 2000   CL 101 04/14/2015 2000   CO2 25 04/14/2015 2000   GLUCOSE 133* 04/14/2015 2000   BUN 16 04/14/2015 2000   CREATININE 1.48* 04/14/2015 2000   CREATININE 1.12 01/13/2015 1507   CALCIUM 9.0 04/14/2015 2000  PROT 6.5 03/31/2015 0305   ALBUMIN 3.1* 03/31/2015 0305   AST 35 03/31/2015 0305   ALT 12* 03/31/2015 0305   ALKPHOS 89 03/31/2015 0305   BILITOT 1.6* 03/31/2015 0305   GFRNONAA 40* 04/14/2015 2000   GFRNONAA 58* 01/13/2015 1507   GFRAA 47* 04/14/2015 2000   GFRAA 67 01/13/2015 1507    Assessment and Plan  Sepsis Sepsis likely due to HCAP vs aspiration pneumonia - recurrent, patient evaluated by SLP and recommended dysphagia 3 (mechanical soft and nectar thick liquid). Patient seems to be doing well with this diet under supervision. He was on broad spectrum antibiotics for HCAP coverage with vancomycin and zosyn from 5/28 >> 6/3, his antibiotics were narrow to Levaquin and will need 3 additional days on discharge; BC grew out 2 contaminanrs  HCAP (healthcare-associated pneumonia) Sepsis likely due to HCAP vs aspiration pneumonia - recurrent, patient evaluated by SLP and recommended dysphagia 3 (mechanical soft and nectar thick liquid). Patient seems to be doing well with this diet under supervision. He was on broad spectrum antibiotics for HCAP coverage with vancomycin and zosyn from 5/28 >> 6/3, his antibiotics were narrow to Levaquin and will need 3 additional days on discharge  Acute respiratory failure with hypoxia 2/2 to PNA, either HCAP or aspiration ;CT chest neg ftr PEW  COPD exacerbation wheezing on admission, he was treated with IV steroids, transitioned to prednisone, tapered now to 10 mg daily, will need 3 additional days.   Chronic diastolic CHF (congestive heart  failure) Euvolemic during admission  Chronic atrial fibrillation Stable during hospital stay;cont xarelto  Dysphagia SLP rec dysphagia 3 diet, mech soft and nectar thick  Hypothyroidism TSH 3.7 5/31 - continue synthroid 25 mcg  Protein-calorie malnutrition, severe Noted, support with diet  Compression fracture of T12 vertebra New and acute, incidentally seen on CT - pt asymptomatic  Anemia, iron deficiency Hb at 10.7 is stable, continue iron tablet daily    Hennie Duos, MD

## 2015-04-07 NOTE — Assessment & Plan Note (Addendum)
New and acute, incidentally seen on CT - pt asymptomatic

## 2015-04-07 NOTE — Assessment & Plan Note (Signed)
Noted, support with diet

## 2015-04-07 NOTE — Assessment & Plan Note (Signed)
Sepsis likely due to HCAP vs aspiration pneumonia - recurrent, patient evaluated by SLP and recommended dysphagia 3 (mechanical soft and nectar thick liquid). Patient seems to be doing well with this diet under supervision. He was on broad spectrum antibiotics for HCAP coverage with vancomycin and zosyn from 5/28 >> 6/3, his antibiotics were narrow to Levaquin and will need 3 additional days on discharge

## 2015-04-07 NOTE — Assessment & Plan Note (Signed)
wheezing on admission, he was treated with IV steroids, transitioned to prednisone, tapered now to 10 mg daily, will need 3 additional days.

## 2015-04-07 NOTE — Assessment & Plan Note (Addendum)
Stable during hospital stay;cont xarelto

## 2015-04-07 NOTE — Assessment & Plan Note (Signed)
Euvolemic during admission

## 2015-04-07 NOTE — Assessment & Plan Note (Signed)
SLP rec dysphagia 3 diet, mech soft and nectar thick

## 2015-04-07 NOTE — Assessment & Plan Note (Signed)
TSH 3.7 5/31 - continue synthroid 25 mcg

## 2015-04-07 NOTE — Assessment & Plan Note (Signed)
Sepsis likely due to HCAP vs aspiration pneumonia - recurrent, patient evaluated by SLP and recommended dysphagia 3 (mechanical soft and nectar thick liquid). Patient seems to be doing well with this diet under supervision. He was on broad spectrum antibiotics for HCAP coverage with vancomycin and zosyn from 5/28 >> 6/3, his antibiotics were narrow to Levaquin and will need 3 additional days on discharge; BC grew out 2 contaminanrs

## 2015-04-08 ENCOUNTER — Encounter: Payer: Self-pay | Admitting: Internal Medicine

## 2015-04-08 LAB — TSH: TSH: 4.8 u[IU]/mL (ref <=5.90)

## 2015-04-09 LAB — CULTURE, BLOOD (ROUTINE X 2)
CULTURE: NO GROWTH
Culture: NO GROWTH

## 2015-04-10 LAB — LIPID PANEL
CHOLESTEROL: 138 mg/dL (ref 0–200)
HDL: 38 mg/dL (ref 35–70)
LDL Cholesterol: 81 mg/dL
Triglycerides: 97 mg/dL (ref 40–160)

## 2015-04-14 ENCOUNTER — Encounter (HOSPITAL_COMMUNITY): Payer: Self-pay

## 2015-04-14 ENCOUNTER — Emergency Department (HOSPITAL_COMMUNITY): Payer: Medicare Other

## 2015-04-14 ENCOUNTER — Emergency Department (HOSPITAL_COMMUNITY)
Admission: EM | Admit: 2015-04-14 | Discharge: 2015-04-15 | Disposition: A | Discharge status: Home / Self Care | Payer: Medicare Other | Attending: Emergency Medicine | Admitting: Emergency Medicine

## 2015-04-14 DIAGNOSIS — I10 Essential (primary) hypertension: Secondary | ICD-10-CM | POA: Diagnosis not present

## 2015-04-14 DIAGNOSIS — Y998 Other external cause status: Secondary | ICD-10-CM | POA: Diagnosis not present

## 2015-04-14 DIAGNOSIS — Z79899 Other long term (current) drug therapy: Secondary | ICD-10-CM | POA: Diagnosis not present

## 2015-04-14 DIAGNOSIS — Z85038 Personal history of other malignant neoplasm of large intestine: Secondary | ICD-10-CM | POA: Insufficient documentation

## 2015-04-14 DIAGNOSIS — Z8701 Personal history of pneumonia (recurrent): Secondary | ICD-10-CM | POA: Diagnosis not present

## 2015-04-14 DIAGNOSIS — Z87891 Personal history of nicotine dependence: Secondary | ICD-10-CM | POA: Diagnosis not present

## 2015-04-14 DIAGNOSIS — E785 Hyperlipidemia, unspecified: Secondary | ICD-10-CM | POA: Diagnosis not present

## 2015-04-14 DIAGNOSIS — Y9389 Activity, other specified: Secondary | ICD-10-CM | POA: Insufficient documentation

## 2015-04-14 DIAGNOSIS — I4891 Unspecified atrial fibrillation: Secondary | ICD-10-CM | POA: Insufficient documentation

## 2015-04-14 DIAGNOSIS — F0390 Unspecified dementia without behavioral disturbance: Secondary | ICD-10-CM | POA: Insufficient documentation

## 2015-04-14 DIAGNOSIS — Z792 Long term (current) use of antibiotics: Secondary | ICD-10-CM | POA: Insufficient documentation

## 2015-04-14 DIAGNOSIS — I509 Heart failure, unspecified: Secondary | ICD-10-CM | POA: Diagnosis not present

## 2015-04-14 DIAGNOSIS — Z043 Encounter for examination and observation following other accident: Secondary | ICD-10-CM | POA: Diagnosis present

## 2015-04-14 DIAGNOSIS — Y92128 Other place in nursing home as the place of occurrence of the external cause: Secondary | ICD-10-CM | POA: Insufficient documentation

## 2015-04-14 DIAGNOSIS — E039 Hypothyroidism, unspecified: Secondary | ICD-10-CM | POA: Insufficient documentation

## 2015-04-14 DIAGNOSIS — Z7901 Long term (current) use of anticoagulants: Secondary | ICD-10-CM | POA: Insufficient documentation

## 2015-04-14 DIAGNOSIS — J449 Chronic obstructive pulmonary disease, unspecified: Secondary | ICD-10-CM | POA: Insufficient documentation

## 2015-04-14 DIAGNOSIS — J0140 Acute pansinusitis, unspecified: Secondary | ICD-10-CM | POA: Insufficient documentation

## 2015-04-14 DIAGNOSIS — W1839XA Other fall on same level, initial encounter: Secondary | ICD-10-CM | POA: Insufficient documentation

## 2015-04-14 DIAGNOSIS — R451 Restlessness and agitation: Secondary | ICD-10-CM

## 2015-04-14 LAB — CBC WITH DIFFERENTIAL/PLATELET
Basophils Absolute: 0 10*3/uL (ref 0.0–0.1)
Basophils Relative: 0 % (ref 0–1)
Eosinophils Absolute: 0.3 10*3/uL (ref 0.0–0.7)
Eosinophils Relative: 4 % (ref 0–5)
HCT: 32.5 % — ABNORMAL LOW (ref 39.0–52.0)
Hemoglobin: 10.7 g/dL — ABNORMAL LOW (ref 13.0–17.0)
Lymphocytes Relative: 16 % (ref 12–46)
Lymphs Abs: 1.2 10*3/uL (ref 0.7–4.0)
MCH: 29.9 pg (ref 26.0–34.0)
MCHC: 32.9 g/dL (ref 30.0–36.0)
MCV: 90.8 fL (ref 78.0–100.0)
Monocytes Absolute: 0.9 10*3/uL (ref 0.1–1.0)
Monocytes Relative: 12 % (ref 3–12)
Neutro Abs: 5 10*3/uL (ref 1.7–7.7)
Neutrophils Relative %: 68 % (ref 43–77)
Platelets: 277 10*3/uL (ref 150–400)
RBC: 3.58 MIL/uL — ABNORMAL LOW (ref 4.22–5.81)
RDW: 15.5 % (ref 11.5–15.5)
WBC: 7.3 10*3/uL (ref 4.0–10.5)

## 2015-04-14 LAB — URINALYSIS, ROUTINE W REFLEX MICROSCOPIC
Bilirubin Urine: NEGATIVE
Glucose, UA: NEGATIVE mg/dL
Hgb urine dipstick: NEGATIVE
Ketones, ur: NEGATIVE mg/dL
Leukocytes, UA: NEGATIVE
Nitrite: NEGATIVE
Protein, ur: NEGATIVE mg/dL
Specific Gravity, Urine: 1.016 (ref 1.005–1.030)
Urobilinogen, UA: 0.2 mg/dL (ref 0.0–1.0)
pH: 6 (ref 5.0–8.0)

## 2015-04-14 LAB — BASIC METABOLIC PANEL
Anion gap: 11 (ref 5–15)
BUN: 16 mg/dL (ref 6–20)
CO2: 25 mmol/L (ref 22–32)
Calcium: 9 mg/dL (ref 8.9–10.3)
Chloride: 101 mmol/L (ref 101–111)
Creatinine, Ser: 1.48 mg/dL — ABNORMAL HIGH (ref 0.61–1.24)
GFR calc Af Amer: 47 mL/min — ABNORMAL LOW (ref >60)
GFR calc non Af Amer: 40 mL/min — ABNORMAL LOW (ref >60)
Glucose, Bld: 133 mg/dL — ABNORMAL HIGH (ref 65–99)
Potassium: 3.7 mmol/L (ref 3.5–5.1)
Sodium: 137 mmol/L (ref 135–145)

## 2015-04-14 MED ORDER — LORAZEPAM 2 MG/ML IJ SOLN
1.0000 mg | Freq: Once | INTRAMUSCULAR | Status: AC
  Administered 2015-04-14: 1 mg via INTRAVENOUS
  Filled 2015-04-14: qty 1

## 2015-04-14 MED ORDER — AMOXICILLIN 500 MG PO CAPS: 500.0000 mg | ORAL_CAPSULE | Freq: Three times a day (TID) | ORAL | Status: DC

## 2015-04-14 MED ORDER — SODIUM CHLORIDE 0.9 % IV BOLUS (SEPSIS)
1000.0000 mL | Freq: Once | INTRAVENOUS | Status: AC
  Administered 2015-04-14: 1000 mL via INTRAVENOUS

## 2015-04-14 MED ORDER — FENTANYL CITRATE (PF) 100 MCG/2ML IJ SOLN
50.0000 ug | Freq: Once | INTRAMUSCULAR | Status: AC
  Administered 2015-04-14: 50 ug via INTRAVENOUS
  Filled 2015-04-14: qty 2

## 2015-04-14 MED ORDER — CEFTRIAXONE SODIUM 1 G IJ SOLR
1.0000 g | Freq: Once | INTRAMUSCULAR | Status: AC
  Administered 2015-04-14: 1 g via INTRAVENOUS
  Filled 2015-04-14: qty 10

## 2015-04-14 NOTE — ED Notes (Signed)
MD at bedside. 

## 2015-04-14 NOTE — ED Notes (Signed)
Patient transported to CT 

## 2015-04-14 NOTE — ED Notes (Signed)
Per EMS pt had a fall this morning; Daughter reported to staff at nursing facility that pt was acting confused on yesterday; Pt was acting confused after fall today; unknown if confusion from fall or prior to fall today; Pt is on blood thinners; unknown history of dementia but pt goes in and out of confusion; baseline unknown. Pt ambulates with walker and/or wheelchair; Pt has hx dysphagia.

## 2015-04-14 NOTE — Discharge Instructions (Signed)

## 2015-04-14 NOTE — ED Notes (Signed)
PTAR Called 

## 2015-04-22 NOTE — ED Provider Notes (Signed)
CSN: [id number]     Arrival date & time 04/14/15  1901 History   First MD Initiated Contact with Patient 04/14/15 1903     Chief Complaint  Patient presents with  . Fall  . Altered Mental Status     (Consider location/radiation/quality/duration/timing/severity/associated sxs/prior Treatment) HPI   89yM sent for evaluation of agitation. Noticed yesterday and nursing facility. HX of dementia and sometimes but will act similarly but usually not for this long. Fall this morning down onto his butt. Unclear circumstances. Normally ambulates with walker or gets around in wheelchair. No reported med changes. Pt currently not able to add additional history.   Past Medical History  Diagnosis Date  . Asthma   . Hypertension   . A-fib   . Hyperlipidemia   . Shortness of breath   . CHF (congestive heart failure) 10/28/2011  . Forgetfulness   . PVC's (premature ventricular contractions)   . Mild aortic insufficiency   . Dementia     [name] 02/05/2015  . Failure to thrive in adult     /notes 02/05/2015  . COPD (chronic obstructive pulmonary disease)   . Hypothyroidism     [name] 02/05/2015  . Fall 02/04/2015    with altered mental status off of his baseline after he suffered a mechanical fall /notes 02/04/2015  . CAP (community acquired pneumonia)     [name] 02/05/2015  . Colon cancer    Past Surgical History  Procedure Laterality Date  . Colon surgery      Partial hemecolectomy   . Central line insertion  09/08/2011        Family History  Problem Relation Age of Onset  . Hyperlipidemia Father   . Hypertension Father   . Heart disease Father   . Asthma Father   . Stroke Father   . Heart failure Father   . Heart disease Mother   . Heart failure Mother   . Heart attack Sister    History  Substance Use Topics  . 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 Smokeless tobacco: Not on file     Comment: quit 30+ yrs ago  . Alcohol Use: No    Review of Systems  Level 5 caveat because of dementia.    Allergies  Review of patient's allergies indicates no known allergies.  Home Medications   Prior to Admission medications   Medication Sig Start Date End Date Taking? Authorizing Provider  ADVAIR DISKUS 500-50 MCG/DOSE AEPB INHALE 1 PUFF TWICE A DAY 12/23/14   Susy Frizzle, MD  amoxicillin (AMOXIL) 500 MG capsule Take 1 capsule (500 mg total) by mouth 3 (three) times daily. 04/14/15   Virgel Manifold, MD  feeding supplement, ENSURE ENLIVE, (ENSURE ENLIVE) LIQD Take 237 mLs by mouth 2 (two) times daily between meals. 03/11/15   Geradine Girt, DO  ferrous sulfate 325 (65 FE) MG tablet TAKE 1 TABLET BY MOUTH EVERY DAY 12/23/14   Susy Frizzle, MD  fluticasone [facility]) 50 MCG/ACT nasal spray Place 2 sprays into both nostrils daily.    Historical Provider, MD  guaiFENesin 200 MG tablet Take 200 mg by mouth every 4 (four) hours as needed for cough or to loosen phlegm.    Historical Provider, MD  ipratropium (ATROVENT) 0.02 % nebulizer solution Take 2.5 mLs (0.5 mg total) by nebulization every 4 (four) hours as needed for wheezing or shortness of breath. Patient not taking: Reported on 03/29/2015 03/11/15   Geradine Girt, DO  ipratropium-albuterol (DUONEB) 0.5-2.5 (3) MG/3ML  SOLN Take 3 mLs by nebulization every 6 (six) hours as needed (SOB/Wheezing).    Historical Provider, MD  levalbuterol Penne Lash) 0.63 MG/3ML nebulizer solution Take 3 mLs (0.63 mg total) by nebulization every 4 (four) hours as needed for wheezing or shortness of breath. Patient not taking: Reported on 03/29/2015 03/11/15   Geradine Girt, DO  levofloxacin (LEVAQUIN) 500 MG tablet Take 1 tablet (500 mg total) by mouth daily. For 3 more days 04/05/15   Caren Griffins, MD  levothyroxine (SYNTHROID, LEVOTHROID) 25 MCG tablet TAKE 1 TABLET BY MOUTH EVERY DAY 03/27/15   Susy Frizzle, MD  losartan (COZAAR) 50 MG tablet Take 50 mg by mouth daily. 01/23/15   Historical Provider, MD  omeprazole (PRILOSEC) 40 MG capsule Take 40 mg by  mouth daily.    Historical Provider, MD  pravastatin (PRAVACHOL) 20 MG tablet Take 20 mg by mouth daily.    Historical Provider, MD  predniSONE (DELTASONE) 10 MG tablet Take 1 tablet (10 mg total) by mouth daily with breakfast. For 3 more days 04/05/15   Caren Griffins, MD  rivaroxaban (XARELTO) 20 MG TABS tablet Take 1 tablet (20 mg total) by mouth daily with supper. 03/11/15   Geradine Girt, DO  tamsulosin (FLOMAX) 0.4 MG CAPS capsule Take 1 capsule (0.4 mg total) by mouth daily. Patient not taking: Reported on 03/29/2015 03/11/15   Geradine Girt, DO  venlafaxine XR (EFFEXOR XR) 37.5 MG 24 hr capsule Take 2 capsules (75 mg total) by mouth daily with breakfast. 2 pills in AM 03/11/15   Jessica U Vann, DO   BP 95/40 mmHg  Pulse 60  Temp(Src) 98.3 F (36.8 C) (Axillary)  Resp 15  SpO2 99% Physical Exam  Constitutional: He appears well-developed and well-nourished. No distress.  HENT:  Head: Normocephalic and atraumatic.  Eyes: Conjunctivae are normal. Right eye exhibits no discharge. Left eye exhibits no discharge.  Neck: Neck supple.  Cardiovascular: Normal rate, regular rhythm and normal heart sounds.  Exam reveals no gallop and no friction rub.   No murmur heard. Pulmonary/Chest: Effort normal and breath sounds normal. No respiratory distress.  Abdominal: Soft. He exhibits no distension. There is no tenderness.  Musculoskeletal: He exhibits no edema or tenderness.  Neurological:  Awake. Agitated. Moans. Speech most unintelligible. Moves all extremities with good strength.   Skin: Skin is warm and dry.  Nursing note and vitals reviewed.   ED Course  Procedures (including critical care time) Labs Review Labs Reviewed  URINALYSIS, ROUTINE W REFLEX MICROSCOPIC (NOT AT [facility]) - Abnormal; Notable for the following:    Color, Urine AMBER (*)    All other components within normal limits  CBC WITH DIFFERENTIAL/PLATELET - Abnormal; Notable for the following:    RBC 3.58 (*)    Hemoglobin  10.7 (*)    HCT 32.5 (*)    All other components within normal limits  BASIC METABOLIC PANEL - Abnormal; Notable for the following:    Glucose, Bld 133 (*)    Creatinine, Ser 1.48 (*)    GFR calc non Af Amer 40 (*)    GFR calc Af Amer 47 (*)    All other components within normal limits    Imaging Review No results found.   EKG Interpretation None      MDM   Final diagnoses:  Agitation  Acute pansinusitis, recurrence not specified   89yM with agitation. Unclear etiology. CT wit sinusitis. With advanced dementia, not sure how symptomatic he may  be from this if at all. Will treat. W/u otherwise fairly unrevealing. HD stable. It has been determined that no acute conditions requiring further emergency intervention are present at this time. The patient has been advised of the diagnosis and plan. I reviewed any labs and imaging including any potential incidental findings. We have discussed signs and symptoms that warrant return to the ED and they are listed in the discharge instructions.      Virgel Manifold, MD 04/24/15 [phone]

## 2015-05-04 LAB — BASIC METABOLIC PANEL
BUN: 7 mg/dL (ref 4–21)
Creatinine: 0.7 mg/dL (ref <=1.3)
Glucose: 90 mg/dL
Potassium: 3.3 mmol/L — AB (ref 3.4–5.3)
Sodium: 144 mmol/L (ref 137–147)

## 2015-05-04 LAB — CBC AND DIFFERENTIAL
HCT: 29 % — AB (ref 41–53)
Hemoglobin: 10 g/dL — AB (ref 13.5–17.5)
Platelets: 384 10*3/uL (ref 150–399)
WBC: 7 10*3/mL

## 2015-05-09 ENCOUNTER — Non-Acute Institutional Stay (SKILLED_NURSING_FACILITY): Payer: Medicare Other | Admitting: Internal Medicine

## 2015-05-09 ENCOUNTER — Encounter: Payer: Self-pay | Admitting: Internal Medicine

## 2015-05-09 DIAGNOSIS — R21 Rash and other nonspecific skin eruption: Secondary | ICD-10-CM | POA: Diagnosis not present

## 2015-05-09 DIAGNOSIS — H109 Unspecified conjunctivitis: Secondary | ICD-10-CM

## 2015-05-09 NOTE — Progress Notes (Signed)
MRN: [id number] Name: Todd [name]  Sex: male Age: 81 y.o. DOB: December 25, 1923  [facility] Facility/Room:223A Level Of Care: SNF Provider: Inocencio Homes D Emergency Contacts: Extended Emergency Contact Information Primary Emergency Contact: Seymour Bars [place] Phone: [phone] Relation: Daughter Secondary Emergency Contact: Agapito Games [place] of [place] Mobile Phone: [phone] Relation: Grandson  Code Status:   Allergies: Review of patient's allergies indicates no known allergies.  Chief Complaint  Patient presents with  . Acute Visit    HPI: Patient is 81 y.o. male who is being seen for new onset rash on back and for redness and drainage L eye. History is from nurse and pt's daughter in attendance. Rash onset 3 days ago. Moderately painful and itchy. Described as large single blisters.On R side of back only. No fever or constitutional sx. Pt may have had the shingles vaccine. Has improved with Valtrex 1000 mg q 8 hrs for shingles. Redness L eye noted also 3 days ago. Drainage matted the eye; Some itching and pain. Not contiguous with back rash. Eye has improved with garamycin ointment. R eye is unaffected. No constitutional sx.  Past Medical History  Diagnosis Date  . Asthma   . Hypertension   . A-fib   . Hyperlipidemia   . Shortness of breath   . CHF (congestive heart failure) 10/28/2011  . Forgetfulness   . PVC's (premature ventricular contractions)   . Mild aortic insufficiency   . Dementia     [name] 02/05/2015  . Failure to thrive in adult     /notes 02/05/2015  . COPD (chronic obstructive pulmonary disease)   . Hypothyroidism     [name] 02/05/2015  . Fall 02/04/2015    with altered mental status off of his baseline after he suffered a mechanical fall /notes 02/04/2015  . CAP (community acquired pneumonia)     [name] 02/05/2015  . Colon cancer     Past Surgical History  Procedure Laterality Date  . Colon surgery     Partial hemecolectomy   . Central line insertion  09/08/2011           Medication List       This list is accurate as of: 05/09/15 11:59 PM.  Always use your most recent med list.               ADVAIR DISKUS 500-50 MCG/DOSE Aepb  Generic drug:  Fluticasone-Salmeterol  INHALE 1 PUFF TWICE A DAY     amoxicillin 500 MG capsule  Commonly known as:  AMOXIL  Take 1 capsule (500 mg total) by mouth 3 (three) times daily.     feeding supplement (ENSURE ENLIVE) Liqd  Take 237 mLs by mouth 2 (two) times daily between meals.     ferrous sulfate 325 (65 FE) MG tablet  TAKE 1 TABLET BY MOUTH EVERY DAY     fluticasone 50 MCG/ACT nasal spray  Commonly known as:  FLONASE  Place 2 sprays into both nostrils daily.     guaiFENesin 200 MG tablet  Take 200 mg by mouth every 4 (four) hours as needed for cough or to loosen phlegm.     ipratropium 0.02 % nebulizer solution  Commonly known as:  ATROVENT  Take 2.5 mLs (0.5 mg total) by nebulization every 4 (four) hours as needed for wheezing or shortness of breath.     ipratropium-albuterol 0.5-2.5 (3) MG/3ML Soln  Commonly known as:  DUONEB  Take 3 mLs by nebulization every 6 (six) hours  as needed (SOB/Wheezing).     levalbuterol 0.63 MG/3ML nebulizer solution  Commonly known as:  XOPENEX  Take 3 mLs (0.63 mg total) by nebulization every 4 (four) hours as needed for wheezing or shortness of breath.     levofloxacin 500 MG tablet  Commonly known as:  LEVAQUIN  Take 1 tablet (500 mg total) by mouth daily. For 3 more days     levothyroxine 25 MCG tablet  Commonly known as:  SYNTHROID, LEVOTHROID  TAKE 1 TABLET BY MOUTH EVERY DAY     losartan 50 MG tablet  Commonly known as:  COZAAR  Take 50 mg by mouth daily.     omeprazole 40 MG capsule  Commonly known as:  PRILOSEC  Take 40 mg by mouth daily.     pravastatin 20 MG tablet  Commonly known as:  PRAVACHOL  Take 20 mg by mouth daily.     predniSONE 10 MG tablet  Commonly known as:   DELTASONE  Take 1 tablet (10 mg total) by mouth daily with breakfast. For 3 more days     rivaroxaban 20 MG Tabs tablet  Commonly known as:  XARELTO  Take 1 tablet (20 mg total) by mouth daily with supper.     tamsulosin 0.4 MG Caps capsule  Commonly known as:  FLOMAX  Take 1 capsule (0.4 mg total) by mouth daily.     venlafaxine XR 37.5 MG 24 hr capsule  Commonly known as:  EFFEXOR XR  Take 2 capsules (75 mg total) by mouth daily with breakfast. 2 pills in AM        No orders of the defined types were placed in this encounter.    Immunization History  Administered Date(s) Administered  . Influenza Whole 07/02/2009, 09/10/2011  . Influenza,inj,Quad PF,36+ Mos 08/28/2013, 09/05/2014  . Pneumococcal Conjugate-13 09/05/2014  . Pneumococcal Polysaccharide-23 11/01/2006  . Zoster 08/26/2006    History  Substance Use Topics  . 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 Smokeless tobacco: Not on file     Comment: quit 30+ yrs ago  . Alcohol Use: No    Review of Systems  DATA OBTAINED: from patient, nurse,  family member GENERAL:  no fevers, fatigue, appetite changes SKIN: breakout on R back with some pain and itching-see HPI Eye with onset redness and drainage-see HPI HEENT: L  Eye with onset redness and drainage-see HPI RESPIRATORY: No cough, wheezing, SOB CARDIAC: No chest pain, palpitations, lower extremity edema  GI: No abdominal pain, No N/V/D or constipation, No heartburn or reflux  GU: No dysuria, frequency or urgency, or incontinence  MUSCULOSKELETAL: No unrelieved bone/joint pain NEUROLOGIC: No headache, dizziness  PSYCHIATRIC: No overt anxiety or sadness  Filed Vitals:   05/09/15 1319  BP: 129/85  Pulse: 62  Temp: 98.6 F (37 C)  Resp: 19    Physical Exam  GENERAL APPEARANCE: Alert,min conversant, No acute distress  SKIN: R thoracic back with a number of approx 1 cm blisters that are now unroofed in process of healing, all on a raise erythematous base, a few  light pink roundish lesions on L near midline HEENT: L eye with very injected conjunctiva and diffuse swelling lower lid RESPIRATORY: Breathing is even, unlabored. Lung sounds are clear   CARDIOVASCULAR: Heart RRR no murmurs, rubs or gallops. No peripheral edema  GASTROINTESTINAL: Abdomen is soft, non-tender, not distended w/ normal bowel sounds.  GENITOURINARY: Bladder non tender, not distended  MUSCULOSKELETAL: No abnormal joints or musculature NEUROLOGIC: Cranial nerves 2-12 grossly intact.  With dementia PSYCHIATRIC: Mood and affect appropriate to situation, no behavioral issues  Patient Active Problem List   Diagnosis Date Noted  . Blistering rash 05/11/2015  . Conjunctivitis 05/11/2015  . Compression fracture of T12 vertebra 04/07/2015  . Palliative care encounter 04/02/2015  . Dyspnea 04/02/2015  . Dysphagia 04/02/2015  . DNR (do not resuscitate) 04/02/2015  . COPD (chronic obstructive pulmonary disease) 03/29/2015  . NSVT (nonsustained ventricular tachycardia) 03/29/2015  . COPD exacerbation 03/13/2015  . Anemia, iron deficiency 03/13/2015  . HCAP (healthcare-associated pneumonia) 03/08/2015  . Acute encephalopathy 03/08/2015  . Protein-calorie malnutrition, severe 02/06/2015  . Weakness 02/05/2015  . CAP (community acquired pneumonia) 02/05/2015  . Failure to thrive in adult 02/05/2015  . Fall 02/05/2015  . Pancreatitis, gallstone   . Cholecystitis, acute   . Elevated LFTs   . Preoperative cardiovascular examination   . Acute pancreatitis   . Pancreatitis 09/05/2014  . Hypothyroidism 09/05/2014  . Gallstone pancreatitis 11/23/2013  . Chronic diastolic CHF (congestive heart failure) 10/28/2011  . Pneumonia 09/06/2011  . Sepsis 09/06/2011  . Chest pain 09/06/2011  . Chronic atrial fibrillation 09/06/2011  . Acute respiratory failure with hypoxia 09/06/2011  . PNEUMONIA, ORGANISM UNSPECIFIED 03/16/2010  . EDEMA 06/17/2008  . HLD (hyperlipidemia) 01/24/2008  .  ALLERGIC RHINITIS 01/24/2008  . Asthma 01/24/2008  . BRONCHIECTASIS 01/24/2008  . Nonspecific (abnormal) findings on radiological and other examination of body structure 01/24/2008  . COLON CANCER 01/24/2008  . CHEST XRAY, ABNORMAL 01/24/2008    CBC    Component Value Date/Time   WBC 7.3 04/14/2015 2000   RBC 3.58* 04/14/2015 2000   RBC 3.42* 09/14/2011 0955   HGB 10.7* 04/14/2015 2000   HCT 32.5* 04/14/2015 2000   PLT 277 04/14/2015 2000   MCV 90.8 04/14/2015 2000   LYMPHSABS 1.2 04/14/2015 2000   MONOABS 0.9 04/14/2015 2000   EOSABS 0.3 04/14/2015 2000   BASOSABS 0.0 04/14/2015 2000    CMP     Component Value Date/Time   NA 137 04/14/2015 2000   K 3.7 04/14/2015 2000   CL 101 04/14/2015 2000   CO2 25 04/14/2015 2000   GLUCOSE 133* 04/14/2015 2000   BUN 16 04/14/2015 2000   CREATININE 1.48* 04/14/2015 2000   CREATININE 1.12 01/13/2015 1507   CALCIUM 9.0 04/14/2015 2000   PROT 6.5 03/31/2015 0305   ALBUMIN 3.1* 03/31/2015 0305   AST 35 03/31/2015 0305   ALT 12* 03/31/2015 0305   ALKPHOS 89 03/31/2015 0305   BILITOT 1.6* 03/31/2015 0305   GFRNONAA 40* 04/14/2015 2000   GFRNONAA 58* 01/13/2015 1507   GFRAA 47* 04/14/2015 2000   GFRAA 67 01/13/2015 1507    Assessment and Plan  Blistering rash Shingles vs other. All is not typical of shingles but if pt has had vaccine it may not look typical. On the other hand it could be something like bullous pemphigoid which can be serious. Finish course of valtrex, will observe for a week longer  then plan to send to wound clinic for a biopsy that can be sent to path   Conjunctivitis Severe, reported improved with garamycin QID, but still quite red. Small drainage. Will continue ointment out longer, 7 days will not be long enough.    Hennie Duos, MD

## 2015-05-11 ENCOUNTER — Encounter: Payer: Self-pay | Admitting: Internal Medicine

## 2015-05-11 DIAGNOSIS — R21 Rash and other nonspecific skin eruption: Secondary | ICD-10-CM | POA: Insufficient documentation

## 2015-05-11 DIAGNOSIS — H109 Unspecified conjunctivitis: Secondary | ICD-10-CM | POA: Insufficient documentation

## 2015-05-11 NOTE — Assessment & Plan Note (Signed)
Shingles vs other. All is not typical of shingles but if pt has had vaccine it may not look typical. On the other hand it could be something like bullous pemphigoid which can be serious. Finish course of valtrex, will observe for a week longer  then plan to send to wound clinic for a biopsy that can be sent to path

## 2015-05-11 NOTE — Assessment & Plan Note (Signed)
Severe, reported improved with garamycin QID, but still quite red. Small drainage. Will continue ointment out longer, 7 days will not be long enough.

## 2015-05-13 NOTE — Addendum Note (Signed)
Addended by: Inocencio Homes D on: 05/13/2015 09:15 PM   Modules accepted: Level of Service

## 2015-05-13 NOTE — Assessment & Plan Note (Signed)
Hb at 10.7 is stable, continue iron tablet daily

## 2015-05-14 NOTE — Addendum Note (Signed)
Addended by: Inocencio Homes D on: 05/14/2015 11:20 AM   Modules accepted: Level of Service

## 2015-05-16 ENCOUNTER — Non-Acute Institutional Stay (SKILLED_NURSING_FACILITY): Payer: Medicare Other | Admitting: Nurse Practitioner

## 2015-05-16 ENCOUNTER — Encounter: Payer: Self-pay | Admitting: Nurse Practitioner

## 2015-05-16 DIAGNOSIS — H109 Unspecified conjunctivitis: Secondary | ICD-10-CM

## 2015-05-16 DIAGNOSIS — N400 Enlarged prostate without lower urinary tract symptoms: Secondary | ICD-10-CM

## 2015-05-16 DIAGNOSIS — R627 Adult failure to thrive: Secondary | ICD-10-CM | POA: Diagnosis not present

## 2015-05-16 DIAGNOSIS — I482 Chronic atrial fibrillation, unspecified: Secondary | ICD-10-CM

## 2015-05-16 DIAGNOSIS — E876 Hypokalemia: Secondary | ICD-10-CM | POA: Diagnosis not present

## 2015-05-16 DIAGNOSIS — D509 Iron deficiency anemia, unspecified: Secondary | ICD-10-CM

## 2015-05-16 DIAGNOSIS — R5381 Other malaise: Secondary | ICD-10-CM | POA: Diagnosis not present

## 2015-05-16 DIAGNOSIS — J449 Chronic obstructive pulmonary disease, unspecified: Secondary | ICD-10-CM | POA: Diagnosis not present

## 2015-05-16 DIAGNOSIS — I5032 Chronic diastolic (congestive) heart failure: Secondary | ICD-10-CM | POA: Diagnosis not present

## 2015-05-16 NOTE — Progress Notes (Signed)
Patient ID: Todd [name], male   DOB: 02-15-1924, 81 y.o.   MRN: [id number]    Nursing Home Location:  [facility]   Place of Service: SNF (52)  PCP: Odette Fraction, MD  No Known Allergies  Chief Complaint  Patient presents with  . Medical Management of Chronic Issues    Routine Visit     HPI:  Patient is a 81 y.o. male seen today at [facility] for routine follow up on chronic conditions. Pt with a pmh of COPD, HTN, a fib, CHF, hypothyroidism, hyperlipemia. Pt has been doing well per nursing. Eating good except for today did not want to eat lunch. Working with therapy. Pt very HOH therefore HPI and ROS limited from pt. Pt denies shortness of breath or chest pains. Denies constipation or problems with urination. No pain noted.   Review of Systems: from staff and pt Review of Systems  Constitutional: Negative for activity change, appetite change, fatigue and unexpected weight change.  HENT: Positive for hearing loss (very HOH). Negative for congestion.   Eyes: Negative.   Respiratory: Negative for cough and shortness of breath.   Cardiovascular: Negative for chest pain, palpitations and leg swelling.  Gastrointestinal: Negative for abdominal pain, diarrhea and constipation.  Genitourinary: Negative for dysuria and difficulty urinating.  Musculoskeletal: Negative for myalgias and arthralgias.  Skin: Negative for color change and wound.  Neurological: Positive for weakness. Negative for dizziness.  Psychiatric/Behavioral: Negative for behavioral problems, confusion and agitation.    Past Medical History  Diagnosis Date  . Asthma   . Hypertension   . A-fib   . Hyperlipidemia   . Shortness of breath   . CHF (congestive heart failure) 10/28/2011  . Forgetfulness   . PVC's (premature ventricular contractions)   . Mild aortic insufficiency   . Dementia     [name] 02/05/2015  . Failure to thrive in adult     /notes 02/05/2015  . COPD (chronic  obstructive pulmonary disease)   . Hypothyroidism     [name] 02/05/2015  . Fall 02/04/2015    with altered mental status off of his baseline after he suffered a mechanical fall /notes 02/04/2015  . CAP (community acquired pneumonia)     [name] 02/05/2015  . Colon cancer    Past Surgical History  Procedure Laterality Date  . Colon surgery      Partial hemecolectomy   . Central line insertion  09/08/2011        Social History:   reports that he has quit smoking. He does not have any smokeless tobacco history on file. He reports that he does not drink alcohol or use illicit drugs.  Family History  Problem Relation Age of Onset  . Hyperlipidemia Father   . Hypertension Father   . Heart disease Father   . Asthma Father   . Stroke Father   . Heart failure Father   . Heart disease Mother   . Heart failure Mother   . Heart attack Sister     Medications: Patient's Medications  New Prescriptions   No medications on file  Previous Medications   ACETAMINOPHEN (TYLENOL) 325 MG TABLET    Take 650 mg by mouth every 6 (six) hours as needed for mild pain.   ADVAIR DISKUS 500-50 MCG/DOSE AEPB    INHALE 1 PUFF TWICE A DAY   ARTIFICIAL TEAR SOLUTION OP    Apply to eye. Instill 2 drops into both eyes four times daily as needed  FEEDING SUPPLEMENT, ENSURE ENLIVE, (ENSURE ENLIVE) LIQD    Take 237 mLs by mouth 2 (two) times daily between meals.   FERROUS SULFATE 325 (65 FE) MG TABLET    TAKE 1 TABLET BY MOUTH EVERY DAY   FLUTICASONE (FLONASE) 50 MCG/ACT NASAL SPRAY    Place 2 sprays into both nostrils daily.   GUAIFENESIN 200 MG TABLET    Take 200 mg by mouth every 4 (four) hours as needed for cough or to loosen phlegm.   IPRATROPIUM-ALBUTEROL (DUONEB) 0.5-2.5 (3) MG/3ML SOLN    Take 3 mLs by nebulization. Every 4-6 hours as needed for SOB/wheezing   LEVALBUTEROL (XOPENEX) 0.63 MG/3ML NEBULIZER SOLUTION    Take 3 mLs (0.63 mg total) by nebulization every 4 (four) hours as needed for wheezing or  shortness of breath.   LEVOTHYROXINE (SYNTHROID, LEVOTHROID) 25 MCG TABLET    TAKE 1 TABLET BY MOUTH EVERY DAY   LOSARTAN (COZAAR) 50 MG TABLET    Take 50 mg by mouth daily.   MICONAZOLE (MICOTIN) 2 % CREAM    Apply 1 application topically 2 (two) times daily. Apply to affected areas on back until resolved   OMEPRAZOLE (PRILOSEC) 40 MG CAPSULE    Take 40 mg by mouth daily.   PRAVASTATIN (PRAVACHOL) 20 MG TABLET    Take 20 mg by mouth daily.   RIVAROXABAN (XARELTO) 20 MG TABS TABLET    Take 1 tablet (20 mg total) by mouth daily with supper.   TAMSULOSIN (FLOMAX) 0.4 MG CAPS CAPSULE    Take 1 capsule (0.4 mg total) by mouth daily.   VENLAFAXINE XR (EFFEXOR XR) 37.5 MG 24 HR CAPSULE    Take 2 capsules (75 mg total) by mouth daily with breakfast. 2 pills in AM  Modified Medications   No medications on file  Discontinued Medications   AMOXICILLIN (AMOXIL) 500 MG CAPSULE    Take 1 capsule (500 mg total) by mouth 3 (three) times daily.   IPRATROPIUM (ATROVENT) 0.02 % NEBULIZER SOLUTION    Take 2.5 mLs (0.5 mg total) by nebulization every 4 (four) hours as needed for wheezing or shortness of breath.   LEVOFLOXACIN (LEVAQUIN) 500 MG TABLET    Take 1 tablet (500 mg total) by mouth daily. For 3 more days   PREDNISONE (DELTASONE) 10 MG TABLET    Take 1 tablet (10 mg total) by mouth daily with breakfast. For 3 more days     Physical Exam: Filed Vitals:   05/16/15 1401  BP: 126/89  Pulse: 70  Temp: 98.8 F (37.1 C)  TempSrc: Oral  Resp: 20  Height: 5\' 7"  (1.702 m)  Weight: 142 lb 9.6 oz (64.683 kg)    Physical Exam  Constitutional: No distress.  Frail male in NAD  HENT:  Head: Normocephalic and atraumatic.  Mouth/Throat: Oropharynx is clear and moist. No oropharyngeal exudate.  Eyes: Conjunctivae and EOM are normal. Pupils are equal, round, and reactive to light.  Neck: Normal range of motion. Neck supple.  Cardiovascular: Normal rate, regular rhythm and normal heart sounds.     Pulmonary/Chest: Effort normal and breath sounds normal.  Abdominal: Soft. Bowel sounds are normal.  Musculoskeletal: He exhibits no edema or tenderness.  Neurological: He is alert.  Skin: Skin is warm and dry. He is not diaphoretic.    Labs reviewed: Basic Metabolic Panel:  Recent Labs  03/08/15 1515  03/29/15 0737  03/31/15 0305 04/01/15 0351 04/14/15 2000 05/04/15  NA  --   < > 138  < >  142 143 137 144  K  --   < > 3.6  < > 5.1 3.8 3.7 3.3*  CL  --   < > 101  < > 110 105 101  --   CO2  --   < > 21*  < > 23 25 25   --   GLUCOSE  --   < > 135*  < > 144* 121* 133*  --   BUN  --   < > 12  < > 31* 32* 16 7  CREATININE  --   < > 0.92  < > 1.09 0.90 1.48* 0.7  CALCIUM  --   < > 8.8*  < > 9.6 9.4 9.0  --   MG 1.7  --  1.9  --  2.1  --   --   --   < > = values in this interval not displayed. Liver Function Tests:  Recent Labs  03/29/15 0737 03/30/15 0350 03/31/15 0305  AST 18 18 35  ALT 10* 11* 12*  ALKPHOS 106 98 89  BILITOT 0.4 0.5 1.6*  PROT 6.7 7.0 6.5  ALBUMIN 3.3* 3.2* 3.1*    Recent Labs  09/08/14 0444 09/09/14 0345 09/10/14 0414  LIPASE 80* 65* 78*    Recent Labs  02/05/15 0120  AMMONIA 10*   CBC:  Recent Labs  03/25/15 1825  03/29/15 0737  03/31/15 0305 04/01/15 0351 04/14/15 2000 05/04/15  WBC 6.1  --  9.9  < > 19.3* 11.6* 7.3 7.0  NEUTROABS 2.0  --  4.4  --   --   --  5.0  --   HGB 10.9*  < > 11.2*  < > 10.6* 10.6* 10.7* 10.0*  HCT 33.7*  < > 34.2*  < > 32.5* 33.1* 32.5* 29*  MCV 90.6  --  90.0  < > 90.0 90.7 90.8  --   PLT 415*  --  365  < > 394 333 277 384  < > = values in this interval not displayed. TSH:  Recent Labs  01/13/15 1507 04/01/15 0351 04/08/15  TSH 3.834 3.736 4.80   A1C: Lab Results  Component Value Date   HGBA1C 6.1* 09/06/2014   Lipid Panel:  Recent Labs  09/06/14 0033 10/04/14 1017 04/10/15  CHOL 135 179 138  HDL 42 50 38  LDLCALC 78 103* 81  TRIG 77 128 97  CHOLHDL 3.2 3.6  --       Assessment/Plan 1. Chronic atrial fibrillation Rate controlled, cont on xarelto for anticoagulation   2. Chronic diastolic CHF (congestive heart failure) Euvolemic, not currently on diuretic  3. Chronic obstructive pulmonary disease, unspecified COPD, unspecified chronic bronchitis type Recent hospitalization for acute respiratory failure, pts breathing has improved and using O2 at night, conts on advair and neb treatments   4. Anemia, iron deficiency Will follow up cbc, conts on iron daily  5. Conjunctivitis of left eye resolved  6. Hypokalemia Has been supplemented with potassium, will follow up bmp  7. Failure to thrive in adult -pt with good appetite and intake per nursing except for today, to have staff provide close monitoring at this time -cont supplements and support  8. BPH (benign prostatic hyperplasia) -stable, conts on flomax  9. Acute respiratory failure Improved Follow up xray with no acute disease  10. Debility Staff noted pt has been doing very well. Today with increased weakness and debility with decreased appetite for today only. Vital signs have been stable, no fever or chill, no  worsening in shortness of breath. O2 sats maintaining.  Will follow up blood work and have staff monitor for ongoing changes.     Carlos American. Harle Battiest  [facility] [phone] 8 am - 5 pm) [phone] (after hours)

## 2015-06-14 ENCOUNTER — Non-Acute Institutional Stay (SKILLED_NURSING_FACILITY): Payer: Medicare Other | Admitting: Internal Medicine

## 2015-06-14 ENCOUNTER — Encounter: Payer: Self-pay | Admitting: Internal Medicine

## 2015-06-14 DIAGNOSIS — M1990 Unspecified osteoarthritis, unspecified site: Secondary | ICD-10-CM

## 2015-06-14 DIAGNOSIS — M064 Inflammatory polyarthropathy: Secondary | ICD-10-CM

## 2015-06-14 NOTE — Progress Notes (Signed)
MRN: [id number] Name: Todd [name]  Sex: male Age: 81 y.o. DOB: [birth year]  [facility] Facility/Room:223 Level Of Care: SNF Provider: Inocencio Homes D Emergency Contacts: Extended Emergency Contact Information Primary Emergency Contact: Seymour Bars [place] Phone: [phone] Relation: Daughter Secondary Emergency Contact: Agapito Games [place] of [place] Mobile Phone: [phone] Relation: Grandson  Code Status:   Allergies: Review of patient's allergies indicates no known allergies.  Chief Complaint  Patient presents with  . Acute Visit    HPI: Patient is 81 y.o. male who nursing asked me to see for swelling, redness and pain in R hand. Onset yesterday, worse today. No fever,no SOB or other constitutional sx. No hx of gout. No trauma to area. No other joints affected.  Past Medical History  Diagnosis Date  . Asthma   . Hypertension   . A-fib   . Hyperlipidemia   . Shortness of breath   . CHF (congestive heart failure) 10/28/2011  . Forgetfulness   . PVC's (premature ventricular contractions)   . Mild aortic insufficiency   . Dementia     [name] 02/05/2015  . Failure to thrive in adult     /notes 02/05/2015  . COPD (chronic obstructive pulmonary disease)   . Hypothyroidism     [name] 02/05/2015  . Fall 02/04/2015    with altered mental status off of his baseline after he suffered a mechanical fall /notes 02/04/2015  . CAP (community acquired pneumonia)     [name] 02/05/2015  . Colon cancer     Past Surgical History  Procedure Laterality Date  . Colon surgery      Partial hemecolectomy   . Central line insertion  09/08/2011           Medication List       This list is accurate as of: 06/14/15  5:38 PM.  Always use your most recent med list.               acetaminophen 325 MG tablet  Commonly known as:  TYLENOL  Take 650 mg by mouth every 6 (six) hours as needed for mild pain.     ADVAIR DISKUS 500-50 MCG/DOSE  Aepb  Generic drug:  Fluticasone-Salmeterol  INHALE 1 PUFF TWICE A DAY     ARTIFICIAL TEAR SOLUTION OP  Apply to eye. Instill 2 drops into both eyes four times daily as needed     feeding supplement (ENSURE ENLIVE) Liqd  Take 237 mLs by mouth 2 (two) times daily between meals.     ferrous sulfate 325 (65 FE) MG tablet  TAKE 1 TABLET BY MOUTH EVERY DAY     fluticasone 50 MCG/ACT nasal spray  Commonly known as:  FLONASE  Place 2 sprays into both nostrils daily.     guaiFENesin 200 MG tablet  Take 200 mg by mouth every 4 (four) hours as needed for cough or to loosen phlegm.     ipratropium-albuterol 0.5-2.5 (3) MG/3ML Soln  Commonly known as:  DUONEB  Take 3 mLs by nebulization. Every 4-6 hours as needed for SOB/wheezing     levalbuterol 0.63 MG/3ML nebulizer solution  Commonly known as:  XOPENEX  Take 3 mLs (0.63 mg total) by nebulization every 4 (four) hours as needed for wheezing or shortness of breath.     levothyroxine 25 MCG tablet  Commonly known as:  SYNTHROID, LEVOTHROID  TAKE 1 TABLET BY MOUTH EVERY DAY     losartan 50 MG tablet  Commonly known  as:  COZAAR  Take 50 mg by mouth daily.     miconazole 2 % cream  Commonly known as:  MICOTIN  Apply 1 application topically 2 (two) times daily. Apply to affected areas on back until resolved     omeprazole 40 MG capsule  Commonly known as:  PRILOSEC  Take 40 mg by mouth daily.     pravastatin 20 MG tablet  Commonly known as:  PRAVACHOL  Take 20 mg by mouth daily.     rivaroxaban 20 MG Tabs tablet  Commonly known as:  XARELTO  Take 1 tablet (20 mg total) by mouth daily with supper.     tamsulosin 0.4 MG Caps capsule  Commonly known as:  FLOMAX  Take 1 capsule (0.4 mg total) by mouth daily.     venlafaxine XR 37.5 MG 24 hr capsule  Commonly known as:  EFFEXOR XR  Take 2 capsules (75 mg total) by mouth daily with breakfast. 2 pills in AM        No orders of the defined types were placed in this encounter.     Immunization History  Administered Date(s) Administered  . Influenza Whole 07/02/2009, 09/10/2011  . Influenza,inj,Quad PF,36+ Mos 08/28/2013, 09/05/2014  . Pneumococcal Conjugate-13 09/05/2014  . Pneumococcal Polysaccharide-23 11/01/2006  . Zoster 08/26/2006    Social History  Substance Use Topics  . 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 Smokeless tobacco: Not on file     Comment: quit 30+ yrs ago  . Alcohol Use: No    Review of Systems  DATA OBTAINED: from patient, nurse- mostly nurse, pt with dementia GENERAL:  no fevers, fatigue, appetite changes SKIN: redness , swelling R hand HEENT: No complaint RESPIRATORY: No cough, wheezing, SOB CARDIAC: No chest pain, palpitations, lower extremity edema   GI: No abdominal pain, No N/V/D or constipation, No heartburnr reflux  GU: No dysuria, frequency or urgency, or incontinence  MUSCULOSKELETAL: No unrelieved bone/joint pain NEUROLOGIC: No headache, dizziness  PSYCHIATRIC: No overt anxiety or sadness  Filed Vitals:   06/14/15 1727  BP: 119/65  Pulse: 60  Temp: 97.5 F (36.4 C)  Resp: 20    Physical Exam  GENERAL APPEARANCE: Alert, conversant, No acute distress  SKIN: R hand -mod swelling and redness to thumb MCP joint and extends from there partially across hand  HEENT: Unremarkable RESPIRATORY: Breathing is even, unlabored. Lung sounds are clear   CARDIOVASCULAR: Heart RRR no murmurs, rubs or gallops. No peripheral edema  GASTROINTESTINAL: Abdomen is soft, non-tender, not distended w/ normal bowel sounds.  GENITOURINARY: Bladder non tender, not distended  MUSCULOSKELETAL: swelling R hand as above NEUROLOGIC: Cranial nerves 2-12 grossly intact. Moves all extremities PSYCHIATRIC: Mood and affect appropriate to situation, no behavioral issues  Patient Active Problem List   Diagnosis Date Noted  . Inflammatory arthritis 06/14/2015  . Blistering rash 05/11/2015  . Conjunctivitis 05/11/2015  . Compression fracture of T12  vertebra 04/07/2015  . Palliative care encounter 04/02/2015  . Dyspnea 04/02/2015  . Dysphagia 04/02/2015  . DNR (do not resuscitate) 04/02/2015  . COPD (chronic obstructive pulmonary disease) 03/29/2015  . NSVT (nonsustained ventricular tachycardia) 03/29/2015  . COPD exacerbation 03/13/2015  . Anemia, iron deficiency 03/13/2015  . HCAP (healthcare-associated pneumonia) 03/08/2015  . Acute encephalopathy 03/08/2015  . Protein-calorie malnutrition, severe 02/06/2015  . Weakness 02/05/2015  . CAP (community acquired pneumonia) 02/05/2015  . Failure to thrive in adult 02/05/2015  . Fall 02/05/2015  . Pancreatitis, gallstone   . Cholecystitis, acute   .  Elevated LFTs   . Preoperative cardiovascular examination   . Acute pancreatitis   . Pancreatitis 09/05/2014  . Hypothyroidism 09/05/2014  . Gallstone pancreatitis 11/23/2013  . Chronic diastolic CHF (congestive heart failure) 10/28/2011  . Pneumonia 09/06/2011  . Sepsis 09/06/2011  . Chest pain 09/06/2011  . Chronic atrial fibrillation 09/06/2011  . Acute respiratory failure with hypoxia 09/06/2011  . PNEUMONIA, ORGANISM UNSPECIFIED 03/16/2010  . EDEMA 06/17/2008  . HLD (hyperlipidemia) 01/24/2008  . ALLERGIC RHINITIS 01/24/2008  . Asthma 01/24/2008  . BRONCHIECTASIS 01/24/2008  . Nonspecific (abnormal) findings on radiological and other examination of body structure 01/24/2008  . COLON CANCER 01/24/2008  . CHEST XRAY, ABNORMAL 01/24/2008    CBC    Component Value Date/Time   WBC 7.0 05/04/2015   WBC 7.3 04/14/2015 2000   RBC 3.58* 04/14/2015 2000   RBC 3.42* 09/14/2011 0955   HGB 10.0* 05/04/2015   HCT 29* 05/04/2015   PLT 384 05/04/2015   MCV 90.8 04/14/2015 2000   LYMPHSABS 1.2 04/14/2015 2000   MONOABS 0.9 04/14/2015 2000   EOSABS 0.3 04/14/2015 2000   BASOSABS 0.0 04/14/2015 2000    CMP     Component Value Date/Time   NA 144 05/04/2015   NA 137 04/14/2015 2000   K 3.3* 05/04/2015   CL 101 04/14/2015  2000   CO2 25 04/14/2015 2000   GLUCOSE 133* 04/14/2015 2000   BUN 7 05/04/2015   BUN 16 04/14/2015 2000   CREATININE 0.7 05/04/2015   CREATININE 1.48* 04/14/2015 2000   CREATININE 1.12 01/13/2015 1507   CALCIUM 9.0 04/14/2015 2000   PROT 6.5 03/31/2015 0305   ALBUMIN 3.1* 03/31/2015 0305   AST 35 03/31/2015 0305   ALT 12* 03/31/2015 0305   ALKPHOS 89 03/31/2015 0305   BILITOT 1.6* 03/31/2015 0305   GFRNONAA 40* 04/14/2015 2000   GFRNONAA 58* 01/13/2015 1507   GFRAA 11* 04/14/2015 2000   GFRAA 67 01/13/2015 1507    Assessment and Plan  Inflammatory arthritis R THUMB MCP - etiology unknown; tx with 60/40/40/40/40 mg prednisone daily; monitor for improvement    Hennie Duos, MD

## 2015-06-14 NOTE — Assessment & Plan Note (Addendum)
R THUMB MCP - etiology unknown; tx with 60/40/40/40/40 mg prednisone daily; monitor for improvement and complications

## 2015-06-25 ENCOUNTER — Non-Acute Institutional Stay (SKILLED_NURSING_FACILITY): Payer: Medicare Other | Admitting: Nurse Practitioner

## 2015-06-25 DIAGNOSIS — I482 Chronic atrial fibrillation, unspecified: Secondary | ICD-10-CM

## 2015-06-25 DIAGNOSIS — N400 Enlarged prostate without lower urinary tract symptoms: Secondary | ICD-10-CM | POA: Diagnosis not present

## 2015-06-25 DIAGNOSIS — D509 Iron deficiency anemia, unspecified: Secondary | ICD-10-CM | POA: Diagnosis not present

## 2015-06-25 DIAGNOSIS — M064 Inflammatory polyarthropathy: Secondary | ICD-10-CM

## 2015-06-25 DIAGNOSIS — I5032 Chronic diastolic (congestive) heart failure: Secondary | ICD-10-CM | POA: Diagnosis not present

## 2015-06-25 DIAGNOSIS — J449 Chronic obstructive pulmonary disease, unspecified: Secondary | ICD-10-CM | POA: Diagnosis not present

## 2015-06-25 DIAGNOSIS — M1990 Unspecified osteoarthritis, unspecified site: Secondary | ICD-10-CM

## 2015-06-25 DIAGNOSIS — K219 Gastro-esophageal reflux disease without esophagitis: Secondary | ICD-10-CM | POA: Diagnosis not present

## 2015-06-25 NOTE — Progress Notes (Signed)
Patient ID: Todd [name], male   DOB: 12/07/1923, 81 y.o.   MRN: [id number]    Nursing Home Location:  [facility]   Place of Service: SNF (51)  PCP: Odette Fraction, MD  No Known Allergies  Chief Complaint  Patient presents with  . Medical Management of Chronic Issues    HPI:  Patient is a 81 y.o. male seen today at [facility] for routine follow up on chronic conditions. Pt with a pmh of COPD, HTN, a fib, CHF, hypothyroidism, hyperlipemia. Pt has been doing well per nursing. Pt has no complaints. Denies pain.  No trouble swallowing, GERD, N/V or constipation Reports no problem with urination. No increase in shortness of breath, cough or congestion.  Nursing without concerns.   Review of Systems: from staff and pt Review of Systems  Constitutional: Negative for activity change, appetite change, fatigue and unexpected weight change.  HENT: Positive for hearing loss (very HOH). Negative for congestion.   Eyes: Negative.   Respiratory: Negative for cough and shortness of breath.   Cardiovascular: Negative for chest pain, palpitations and leg swelling.  Gastrointestinal: Negative for abdominal pain, diarrhea and constipation.  Genitourinary: Negative for dysuria and difficulty urinating.  Musculoskeletal: Negative for myalgias and arthralgias.  Skin: Negative for color change and wound.  Neurological: Negative for dizziness.  Psychiatric/Behavioral: Negative for behavioral problems, confusion and agitation.    Past Medical History  Diagnosis Date  . Asthma   . Hypertension   . A-fib   . Hyperlipidemia   . Shortness of breath   . CHF (congestive heart failure) 10/28/2011  . Forgetfulness   . PVC's (premature ventricular contractions)   . Mild aortic insufficiency   . Dementia     [name] 02/05/2015  . Failure to thrive in adult     /notes 02/05/2015  . COPD (chronic obstructive pulmonary disease)   . Hypothyroidism     [name] 02/05/2015    . Fall 02/04/2015    with altered mental status off of his baseline after he suffered a mechanical fall /notes 02/04/2015  . CAP (community acquired pneumonia)     [name] 02/05/2015  . Colon cancer    Past Surgical History  Procedure Laterality Date  . Colon surgery      Partial hemecolectomy   . Central line insertion  09/08/2011        Social History:   reports that he has quit smoking. He does not have any smokeless tobacco history on file. He reports that he does not drink alcohol or use illicit drugs.  Family History  Problem Relation Age of Onset  . Hyperlipidemia Father   . Hypertension Father   . Heart disease Father   . Asthma Father   . Stroke Father   . Heart failure Father   . Heart disease Mother   . Heart failure Mother   . Heart attack Sister     Medications: Patient's Medications  New Prescriptions   No medications on file  Previous Medications   ACETAMINOPHEN (TYLENOL) 325 MG TABLET    Take 650 mg by mouth every 6 (six) hours as needed for mild pain.   ADVAIR DISKUS 500-50 MCG/DOSE AEPB    INHALE 1 PUFF TWICE A DAY   ARTIFICIAL TEAR SOLUTION OP    Apply to eye. Instill 2 drops into both eyes four times daily as needed   FEEDING SUPPLEMENT, ENSURE ENLIVE, (ENSURE ENLIVE) LIQD    Take 237 mLs by mouth  2 (two) times daily between meals.   FERROUS SULFATE 325 (65 FE) MG TABLET    TAKE 1 TABLET BY MOUTH EVERY DAY   FLUTICASONE (FLONASE) 50 MCG/ACT NASAL SPRAY    Place 2 sprays into both nostrils daily.   GUAIFENESIN 200 MG TABLET    Take 200 mg by mouth every 4 (four) hours as needed for cough or to loosen phlegm.   IPRATROPIUM-ALBUTEROL (DUONEB) 0.5-2.5 (3) MG/3ML SOLN    Take 3 mLs by nebulization. Every 4-6 hours as needed for SOB/wheezing   LEVALBUTEROL (XOPENEX) 0.63 MG/3ML NEBULIZER SOLUTION    Take 3 mLs (0.63 mg total) by nebulization every 4 (four) hours as needed for wheezing or shortness of breath.   LEVOTHYROXINE (SYNTHROID, LEVOTHROID) 25 MCG TABLET     TAKE 1 TABLET BY MOUTH EVERY DAY   LOSARTAN (COZAAR) 50 MG TABLET    Take 50 mg by mouth daily.   MICONAZOLE (MICOTIN) 2 % CREAM    Apply 1 application topically 2 (two) times daily. Apply to affected areas on back until resolved   OMEPRAZOLE (PRILOSEC) 40 MG CAPSULE    Take 40 mg by mouth daily.   PRAVASTATIN (PRAVACHOL) 20 MG TABLET    Take 20 mg by mouth daily.   RIVAROXABAN (XARELTO) 20 MG TABS TABLET    Take 1 tablet (20 mg total) by mouth daily with supper.   TAMSULOSIN (FLOMAX) 0.4 MG CAPS CAPSULE    Take 1 capsule (0.4 mg total) by mouth daily.   VENLAFAXINE XR (EFFEXOR XR) 37.5 MG 24 HR CAPSULE    Take 2 capsules (75 mg total) by mouth daily with breakfast. 2 pills in AM  Modified Medications   No medications on file  Discontinued Medications   No medications on file     Physical Exam: Filed Vitals:   06/25/15 1841  BP: 100/54  Pulse: 60  Temp: 97.7 F (36.5 C)  Resp: 18  Weight: 137 lb (62.143 kg)    Physical Exam  Constitutional: No distress.  Frail male in NAD  HENT:  Head: Normocephalic and atraumatic.  Mouth/Throat: Oropharynx is clear and moist. No oropharyngeal exudate.  Eyes: Conjunctivae and EOM are normal. Pupils are equal, round, and reactive to light.  Neck: Normal range of motion. Neck supple.  Cardiovascular: Normal rate, regular rhythm and normal heart sounds.   Pulmonary/Chest: Effort normal and breath sounds normal.  Abdominal: Soft. Bowel sounds are normal.  Musculoskeletal: He exhibits no edema or tenderness.  Neurological: He is alert.  Skin: Skin is warm and dry. He is not diaphoretic.    Labs reviewed: Basic Metabolic Panel:  Recent Labs  03/08/15 1515  03/29/15 0737  03/31/15 0305 04/01/15 0351 04/14/15 2000 05/04/15  NA  --   < > 138  < > 142 143 137 144  K  --   < > 3.6  < > 5.1 3.8 3.7 3.3*  CL  --   < > 101  < > 110 105 101  --   CO2  --   < > 21*  < > _0 --   GLUCOSE  --   < > 135*  < > 144* 121* 133*  --   BUN  --    < > 12  < > 31* 32* 16 7  CREATININE  --   < > 0.92  < > 1.09 0.90 1.48* 0.7  CALCIUM  --   < > 8.8*  < > 9.6 9.4  9.0  --   MG 1.7  --  1.9  --  2.1  --   --   --   < > = values in this interval not displayed. Liver Function Tests:  Recent Labs  03/29/15 0737 03/30/15 0350 03/31/15 0305  AST 18 18 35  ALT 10* 11* 12*  ALKPHOS 106 98 89  BILITOT 0.4 0.5 1.6*  PROT 6.7 7.0 6.5  ALBUMIN 3.3* 3.2* 3.1*    Recent Labs  09/08/14 0444 09/09/14 0345 09/10/14 0414  LIPASE 80* 65* 78*    Recent Labs  02/05/15 0120  AMMONIA 10*   CBC:  Recent Labs  03/25/15 1825  03/29/15 0737  03/31/15 0305 04/01/15 0351 04/14/15 2000 05/04/15  WBC 6.1  --  9.9  < > 19.3* 11.6* 7.3 7.0  NEUTROABS 2.0  --  4.4  --   --   --  5.0  --   HGB 10.9*  < > 11.2*  < > 10.6* 10.6* 10.7* 10.0*  HCT 33.7*  < > 34.2*  < > 32.5* 33.1* 32.5* 29*  MCV 90.6  --  90.0  < > 90.0 90.7 90.8  --   PLT 415*  --  365  < > 394 333 277 384  < > = values in this interval not displayed. TSH:  Recent Labs  01/13/15 1507 04/01/15 0351 04/08/15  TSH 3.834 3.736 4.80   A1C: Lab Results  Component Value Date   HGBA1C 6.1* 09/06/2014   Lipid Panel:  Recent Labs  09/06/14 0033 10/04/14 1017 04/10/15  CHOL 135 179 138  HDL 42 50 38  LDLCALC 78 103* 81  TRIG 77 128 97  CHOLHDL 3.2 3.6  --    CBC with Diff    Result: 05/18/2015 12:38 AM   ( Status: F )     C WBC 7.7     4.0-10.5 K/uL SLN   RBC 3.39   L 4.22-5.81 MIL/uL SLN   Hemoglobin 10.1   L 13.0-17.0 g/dL SLN   Hematocrit 29.7   L 39.0-52.0 % SLN   MCV 87.6     78.0-100.0 fL SLN   MCH 29.8     26.0-34.0 pg SLN   MCHC 34.0     30.0-36.0 g/dL SLN   RDW 16.3   H 11.5-15.5 % SLN   Platelet Count 390     150-400 K/uL SLN   MPV 9.2     8.6-12.4 fL SLN   Granulocyte % 38   L 43-77 % SLN   Absolute Gran 2.9     1.7-7.7 K/uL SLN   Lymph % 26     12-46 % SLN   Absolute Lymph 2.0     0.7-4.0 K/uL SLN   Mono % 16   H 3-12 % SLN   Absolute Mono 1.2     H 0.1-1.0 K/uL SLN   Eos % 19   H 0-5 % SLN   Absolute Eos 1.5   H 0.0-0.7 K/uL SLN   Baso % 1     0-1 % SLN   Absolute Baso 0.1     0.0-0.1 K/uL SLN   Smear Review Criteria for review not met  SLN   Basic Metabolic Panel    Result: 05/18/2015 1:30 AM   ( Status: F )       Sodium 143     135-145 mEq/L SLN   Potassium 4.0     3.5-5.3 mEq/L SLN   Chloride 107  96-112 mEq/L SLN   CO2 25     19-32 mEq/L SLN   Glucose 84     70-99 mg/dL SLN   BUN 21     6-23 mg/dL SLN   Creatinine 0.99     0.50-1.35 mg/dL SLN   Calcium 9.0    Assessment/Plan 1. Chronic atrial fibrillation Rate controlled, cont on xarelto 20 mg daily for anticoagulation   2. Chronic obstructive pulmonary disease, unspecified COPD, unspecified chronic bronchitis type -stable, without worsening shortness of breath, cough or congestion.  -conts on advair and chronic 02   3. Chronic diastolic CHF (congestive heart failure) -euvolemic at this time, conts on losartan.   4. Anemia, iron deficiency hgb stable, conts on iron daily  5. BPH (benign prostatic hyperplasia) Stable, conts on flomax daily  6. Gastroesophageal reflux disease, esophagitis presence not specified -stable, without symptoms of GERD on omeprazole  7. Inflammatory arthritis Recently with inflammatory arthritis flare, treated with prednisone with good results.    Carlos American. Harle Battiest  [facility] [phone] 8 am - 5 pm) [phone] (after hours)

## 2015-08-08 ENCOUNTER — Non-Acute Institutional Stay (SKILLED_NURSING_FACILITY): Payer: Medicare Other | Admitting: Nurse Practitioner

## 2015-08-08 DIAGNOSIS — E034 Atrophy of thyroid (acquired): Secondary | ICD-10-CM

## 2015-08-08 DIAGNOSIS — J449 Chronic obstructive pulmonary disease, unspecified: Secondary | ICD-10-CM | POA: Diagnosis not present

## 2015-08-08 DIAGNOSIS — I5032 Chronic diastolic (congestive) heart failure: Secondary | ICD-10-CM

## 2015-08-08 DIAGNOSIS — D509 Iron deficiency anemia, unspecified: Secondary | ICD-10-CM

## 2015-08-08 DIAGNOSIS — N400 Enlarged prostate without lower urinary tract symptoms: Secondary | ICD-10-CM | POA: Diagnosis not present

## 2015-08-08 DIAGNOSIS — E038 Other specified hypothyroidism: Secondary | ICD-10-CM

## 2015-08-08 DIAGNOSIS — I10 Essential (primary) hypertension: Secondary | ICD-10-CM

## 2015-08-08 NOTE — Progress Notes (Signed)
Patient ID: Todd [name], male   DOB: [birth year], 81 y.o.   MRN: [id number]    Nursing Home Location:  [facility]   Place of Service: SNF (84)  PCP: Odette Fraction, MD  No Known Allergies  Chief Complaint  Patient presents with  . Medical Management of Chronic Issues    HPI:  Patient is a 81 y.o. male seen today at [facility] for routine follow up on chronic conditions. Pt with a pmh of COPD, HTN, a fib, CHF, hypothyroidism, hyperlipemia. Pt has been doing well in the last month. Remains on long term O2 but denies shortness of breath, worsening cough or congestion. No chest pains noted. Weight has been stable in the last month. No complaints of constipation.   Review of Systems: from staff and pt Review of Systems  Constitutional: Negative for activity change, appetite change, fatigue and unexpected weight change.  HENT: Positive for hearing loss (very HOH). Negative for congestion.   Eyes: Negative.   Respiratory: Negative for cough and shortness of breath.   Cardiovascular: Negative for chest pain, palpitations and leg swelling.  Gastrointestinal: Negative for abdominal pain, diarrhea and constipation.  Genitourinary: Negative for dysuria and difficulty urinating.  Musculoskeletal: Negative for myalgias and arthralgias.  Skin: Negative for color change and wound.  Neurological: Negative for dizziness.  Psychiatric/Behavioral: Negative for behavioral problems, confusion and agitation.       Memory loss    Past Medical History  Diagnosis Date  . Asthma   . Hypertension   . A-fib   . Hyperlipidemia   . Shortness of breath   . CHF (congestive heart failure) 10/28/2011  . Forgetfulness   . PVC's (premature ventricular contractions)   . Mild aortic insufficiency   . Dementia     [name] 02/05/2015  . Failure to thrive in adult     /notes 02/05/2015  . COPD (chronic obstructive pulmonary disease)   . Hypothyroidism     [name] 02/05/2015    . Fall 02/04/2015    with altered mental status off of his baseline after he suffered a mechanical fall /notes 02/04/2015  . CAP (community acquired pneumonia)     [name] 02/05/2015  . Colon cancer    Past Surgical History  Procedure Laterality Date  . Colon surgery      Partial hemecolectomy   . Central line insertion  09/08/2011        Social History:   reports that he has quit smoking. He does not have any smokeless tobacco history on file. He reports that he does not drink alcohol or use illicit drugs.  Family History  Problem Relation Age of Onset  . Hyperlipidemia Father   . Hypertension Father   . Heart disease Father   . Asthma Father   . Stroke Father   . Heart failure Father   . Heart disease Mother   . Heart failure Mother   . Heart attack Sister     Medications: Patient's Medications  New Prescriptions   No medications on file  Previous Medications   ACETAMINOPHEN (TYLENOL) 325 MG TABLET    Take 650 mg by mouth every 6 (six) hours as needed for mild pain.   ADVAIR DISKUS 500-50 MCG/DOSE AEPB    INHALE 1 PUFF TWICE A DAY   ARTIFICIAL TEAR SOLUTION OP    Apply to eye. Instill 2 drops into both eyes four times daily as needed   FEEDING SUPPLEMENT, ENSURE ENLIVE, (ENSURE ENLIVE) LIQD  Take 237 mLs by mouth 2 (two) times daily between meals.   FERROUS SULFATE 325 (65 FE) MG TABLET    TAKE 1 TABLET BY MOUTH EVERY DAY   FLUTICASONE (FLONASE) 50 MCG/ACT NASAL SPRAY    Place 2 sprays into both nostrils daily.   GUAIFENESIN 200 MG TABLET    Take 200 mg by mouth every 4 (four) hours as needed for cough or to loosen phlegm.   IPRATROPIUM-ALBUTEROL (DUONEB) 0.5-2.5 (3) MG/3ML SOLN    Take 3 mLs by nebulization. Every 4-6 hours as needed for SOB/wheezing   LEVALBUTEROL (XOPENEX) 0.63 MG/3ML NEBULIZER SOLUTION    Take 3 mLs (0.63 mg total) by nebulization every 4 (four) hours as needed for wheezing or shortness of breath.   LEVOTHYROXINE (SYNTHROID, LEVOTHROID) 25 MCG TABLET     TAKE 1 TABLET BY MOUTH EVERY DAY   LOSARTAN (COZAAR) 50 MG TABLET    Take 50 mg by mouth daily.   MICONAZOLE (MICOTIN) 2 % CREAM    Apply 1 application topically 2 (two) times daily. Apply to affected areas on back until resolved   OMEPRAZOLE (PRILOSEC) 40 MG CAPSULE    Take 40 mg by mouth daily.   PRAVASTATIN (PRAVACHOL) 20 MG TABLET    Take 20 mg by mouth daily.   RIVAROXABAN (XARELTO) 20 MG TABS TABLET    Take 1 tablet (20 mg total) by mouth daily with supper.   TAMSULOSIN (FLOMAX) 0.4 MG CAPS CAPSULE    Take 1 capsule (0.4 mg total) by mouth daily.   VENLAFAXINE XR (EFFEXOR XR) 37.5 MG 24 HR CAPSULE    Take 2 capsules (75 mg total) by mouth daily with breakfast. 2 pills in AM  Modified Medications   No medications on file  Discontinued Medications   No medications on file     Physical Exam: Filed Vitals:   08/08/15 1339  BP: 109/69  Pulse: 69  Temp: 98 F (36.7 C)  Resp: 20  Weight: 139 lb (63.05 kg)    Physical Exam  Constitutional: No distress.  Frail male in NAD  HENT:  Head: Normocephalic and atraumatic.  Mouth/Throat: Oropharynx is clear and moist. No oropharyngeal exudate.  Eyes: Conjunctivae and EOM are normal. Pupils are equal, round, and reactive to light.  Neck: Normal range of motion. Neck supple.  Cardiovascular: Normal rate, regular rhythm and normal heart sounds.   Pulmonary/Chest: Effort normal and breath sounds normal.  Chronic O2  Abdominal: Soft. Bowel sounds are normal.  Musculoskeletal: He exhibits no edema or tenderness.  Neurological: He is alert.  Skin: Skin is warm and dry. He is not diaphoretic.    Labs reviewed: Basic Metabolic Panel:  Recent Labs  03/08/15 1515  03/29/15 0737  03/31/15 0305 04/01/15 0351 04/14/15 2000 05/04/15  NA  --   < > 138  < > 142 143 137 144  K  --   < > 3.6  < > 5.1 3.8 3.7 3.3*  CL  --   < > 101  < > 110 105 101  --   CO2  --   < > 21*  < > _0 --   GLUCOSE  --   < > 135*  < > 144* 121* 133*  --     BUN  --   < > 12  < > 31* 32* 16 7  CREATININE  --   < > 0.92  < > 1.09 0.90 1.48* 0.7  CALCIUM  --   < >  8.8*  < > 9.6 9.4 9.0  --   MG 1.7  --  1.9  --  2.1  --   --   --   < > = values in this interval not displayed. Liver Function Tests:  Recent Labs  03/29/15 0737 03/30/15 0350 03/31/15 0305  AST 18 18 35  ALT 10* 11* 12*  ALKPHOS 106 98 89  BILITOT 0.4 0.5 1.6*  PROT 6.7 7.0 6.5  ALBUMIN 3.3* 3.2* 3.1*    Recent Labs  09/08/14 0444 09/09/14 0345 09/10/14 0414  LIPASE 80* 65* 78*    Recent Labs  02/05/15 0120  AMMONIA 10*   CBC:  Recent Labs  03/25/15 1825  03/29/15 0737  03/31/15 0305 04/01/15 0351 04/14/15 2000 05/04/15  WBC 6.1  --  9.9  < > 19.3* 11.6* 7.3 7.0  NEUTROABS 2.0  --  4.4  --   --   --  5.0  --   HGB 10.9*  < > 11.2*  < > 10.6* 10.6* 10.7* 10.0*  HCT 33.7*  < > 34.2*  < > 32.5* 33.1* 32.5* 29*  MCV 90.6  --  90.0  < > 90.0 90.7 90.8  --   PLT 415*  --  365  < > 394 333 277 384  < > = values in this interval not displayed. TSH:  Recent Labs  01/13/15 1507 04/01/15 0351 04/08/15  TSH 3.834 3.736 4.80   A1C: Lab Results  Component Value Date   HGBA1C 6.1* 09/06/2014   Lipid Panel:  Recent Labs  09/06/14 0033 10/04/14 1017 04/10/15  CHOL 135 179 138  HDL 42 50 38  LDLCALC 78 103* 81  TRIG 77 128 97  CHOLHDL 3.2 3.6  --    CBC with Diff    Result: 05/18/2015 12:38 AM   ( Status: F )     C WBC 7.7     4.0-10.5 K/uL SLN   RBC 3.39   L 4.22-5.81 MIL/uL SLN   Hemoglobin 10.1   L 13.0-17.0 g/dL SLN   Hematocrit 29.7   L 39.0-52.0 % SLN   MCV 87.6     78.0-100.0 fL SLN   MCH 29.8     26.0-34.0 pg SLN   MCHC 34.0     30.0-36.0 g/dL SLN   RDW 16.3   H 11.5-15.5 % SLN   Platelet Count 390     150-400 K/uL SLN   MPV 9.2     8.6-12.4 fL SLN   Granulocyte % 38   L 43-77 % SLN   Absolute Gran 2.9     1.7-7.7 K/uL SLN   Lymph % 26     12-46 % SLN   Absolute Lymph 2.0     0.7-4.0 K/uL SLN   Mono % 16   H 3-12 % SLN    Absolute Mono 1.2   H 0.1-1.0 K/uL SLN   Eos % 19   H 0-5 % SLN   Absolute Eos 1.5   H 0.0-0.7 K/uL SLN   Baso % 1     0-1 % SLN   Absolute Baso 0.1     0.0-0.1 K/uL SLN   Smear Review Criteria for review not met  SLN   Basic Metabolic Panel    Result: 05/18/2015 1:30 AM   ( Status: F )       Sodium 143     135-145 mEq/L SLN   Potassium 4.0     3.5-5.3 mEq/L  SLN   Chloride 107     96-112 mEq/L SLN   CO2 25     19-32 mEq/L SLN   Glucose 84     70-99 mg/dL SLN   BUN 21     6-23 mg/dL SLN   Creatinine 0.99     0.50-1.35 mg/dL SLN   Calcium 9.0    Assessment/Plan  1. Hypertension -stable, maintained on losartan   2. Chronic diastolic CHF (congestive heart failure) (HCC) -euvolemic, no recent exacerbations, maintained on chronic o2  3. Chronic obstructive pulmonary disease, unspecified COPD type (HCC) -COPD stable, no increase in shortness of breath, cough or congestion, cont on advair and neb treatments  4. Anemia, iron deficiency -conts on iron daily   5. BPH (benign prostatic hyperplasia) Stable, maintained on Flomax daily  6. Hypothyroidism due to acquired atrophy of thyroid -stable on synthroid 25 mcg, TSH 4.8 in June   Fielding Mault K. Harle Battiest  [facility] [phone] 8 am - 5 pm) [phone] (after hours)

## 2015-08-18 LAB — CBC AND DIFFERENTIAL
Hemoglobin: 10.8 g/dL — AB (ref 13.5–17.5)
Platelets: 259 10*3/uL (ref 150–399)
WBC: 7.6 10^3/mL

## 2015-08-18 LAB — BASIC METABOLIC PANEL
BUN: 20 mg/dL (ref 4–21)
CREATININE: 1 mg/dL (ref 0.6–1.3)
Glucose: 82 mg/dL
Potassium: 4.3 mmol/L (ref 3.4–5.3)
SODIUM: 140 mmol/L (ref 137–147)

## 2015-09-12 ENCOUNTER — Non-Acute Institutional Stay (SKILLED_NURSING_FACILITY): Payer: Medicare Other | Admitting: Nurse Practitioner

## 2015-09-12 DIAGNOSIS — N400 Enlarged prostate without lower urinary tract symptoms: Secondary | ICD-10-CM

## 2015-09-12 DIAGNOSIS — G253 Myoclonus: Secondary | ICD-10-CM | POA: Diagnosis not present

## 2015-09-12 DIAGNOSIS — I482 Chronic atrial fibrillation, unspecified: Secondary | ICD-10-CM

## 2015-09-12 DIAGNOSIS — E034 Atrophy of thyroid (acquired): Secondary | ICD-10-CM

## 2015-09-12 DIAGNOSIS — D509 Iron deficiency anemia, unspecified: Secondary | ICD-10-CM | POA: Diagnosis not present

## 2015-09-12 DIAGNOSIS — J449 Chronic obstructive pulmonary disease, unspecified: Secondary | ICD-10-CM | POA: Diagnosis not present

## 2015-09-12 DIAGNOSIS — E038 Other specified hypothyroidism: Secondary | ICD-10-CM

## 2015-09-12 DIAGNOSIS — I10 Essential (primary) hypertension: Secondary | ICD-10-CM | POA: Diagnosis not present

## 2015-09-12 NOTE — Progress Notes (Signed)
Patient ID: Todd [name], male   DOB: [birth year], 81 y.o.   MRN: [id number]    Nursing Home Location:  [facility]   Place of Service: SNF (38)  PCP: Odette Fraction, MD  No Known Allergies  Chief Complaint  Patient presents with  . Medical Management of Chronic Issues    HPI:  Patient is a 81 y.o. male seen today at [facility] and [facility] for routine follow up on chronic conditions. Pt with a pmh of COPD, HTN, a fib, CHF, hypothyroidism, hyperlipemia. Pt has been doing well in the last month without acute illness or issues. Staff notes today pt is having twitching motion when asleep. Other stuff report this is chronic. Movement only when sleeping.pt not bothered by movement. Pt denies pain. No worsening shortness of breath or chest pains. Denies constipation.  No numbess or tingling.   Review of Systems: from staff and pt Review of Systems  Constitutional: Negative for activity change, appetite change, fatigue and unexpected weight change.  HENT: Positive for hearing loss (very HOH). Negative for congestion.   Eyes: Negative.   Respiratory: Negative for cough and shortness of breath.   Cardiovascular: Negative for chest pain, palpitations and leg swelling.  Gastrointestinal: Negative for abdominal pain, diarrhea and constipation.  Genitourinary: Negative for dysuria and difficulty urinating.  Musculoskeletal: Negative for myalgias and arthralgias.  Skin: Negative for color change and wound.  Neurological: Negative for dizziness.  Psychiatric/Behavioral: Negative for behavioral problems, confusion and agitation.       Memory loss    Past Medical History  Diagnosis Date  . Asthma   . Hypertension   . A-fib   . Hyperlipidemia   . Shortness of breath   . CHF (congestive heart failure) 10/28/2011  . Forgetfulness   . PVC's (premature ventricular contractions)   . Mild aortic insufficiency   . Dementia     [name] 02/05/2015  . Failure to thrive in  adult     /notes 02/05/2015  . COPD (chronic obstructive pulmonary disease)   . Hypothyroidism     [name] 02/05/2015  . Fall 02/04/2015    with altered mental status off of his baseline after he suffered a mechanical fall /notes 02/04/2015  . CAP (community acquired pneumonia)     [name] 02/05/2015  . Colon cancer    Past Surgical History  Procedure Laterality Date  . Colon surgery      Partial hemecolectomy   . Central line insertion  09/08/2011        Social History:   reports that he has quit smoking. He does not have any smokeless tobacco history on file. He reports that he does not drink alcohol or use illicit drugs.  Family History  Problem Relation Age of Onset  . Hyperlipidemia Father   . Hypertension Father   . Heart disease Father   . Asthma Father   . Stroke Father   . Heart failure Father   . Heart disease Mother   . Heart failure Mother   . Heart attack Sister     Medications: Patient's Medications  New Prescriptions   No medications on file  Previous Medications   ACETAMINOPHEN (TYLENOL) 325 MG TABLET    Take 650 mg by mouth every 6 (six) hours as needed for mild pain.   ADVAIR DISKUS 500-50 MCG/DOSE AEPB    INHALE 1 PUFF TWICE A DAY   ARTIFICIAL TEAR SOLUTION OP    Apply to eye. Instill 2 drops into  both eyes four times daily as needed   FEEDING SUPPLEMENT, ENSURE ENLIVE, (ENSURE ENLIVE) LIQD    Take 237 mLs by mouth 2 (two) times daily between meals.   FERROUS SULFATE 325 (65 FE) MG TABLET    TAKE 1 TABLET BY MOUTH EVERY DAY   FLUTICASONE (FLONASE) 50 MCG/ACT NASAL SPRAY    Place 2 sprays into both nostrils daily.   GUAIFENESIN 200 MG TABLET    Take 200 mg by mouth every 4 (four) hours as needed for cough or to loosen phlegm.   IPRATROPIUM-ALBUTEROL (DUONEB) 0.5-2.5 (3) MG/3ML SOLN    Take 3 mLs by nebulization. Every 4-6 hours as needed for SOB/wheezing   LEVALBUTEROL (XOPENEX) 0.63 MG/3ML NEBULIZER SOLUTION    Take 3 mLs (0.63 mg total) by nebulization every 4  (four) hours as needed for wheezing or shortness of breath.   LEVOTHYROXINE (SYNTHROID, LEVOTHROID) 25 MCG TABLET    TAKE 1 TABLET BY MOUTH EVERY DAY   LOSARTAN (COZAAR) 50 MG TABLET    Take 50 mg by mouth daily.   MICONAZOLE (MICOTIN) 2 % CREAM    Apply 1 application topically 2 (two) times daily. Apply to affected areas on back until resolved   OMEPRAZOLE (PRILOSEC) 40 MG CAPSULE    Take 40 mg by mouth daily.   PRAVASTATIN (PRAVACHOL) 20 MG TABLET    Take 20 mg by mouth daily.   RIVAROXABAN (XARELTO) 20 MG TABS TABLET    Take 1 tablet (20 mg total) by mouth daily with supper.   TAMSULOSIN (FLOMAX) 0.4 MG CAPS CAPSULE    Take 1 capsule (0.4 mg total) by mouth daily.   VENLAFAXINE XR (EFFEXOR XR) 37.5 MG 24 HR CAPSULE    Take 2 capsules (75 mg total) by mouth daily with breakfast. 2 pills in AM  Modified Medications   No medications on file  Discontinued Medications   No medications on file     Physical Exam: Filed Vitals:   09/12/15 1405  BP: 128/70  Pulse: 70  Temp: 98.2 F (36.8 C)  Resp: 20  Weight: 138 lb (62.596 kg)    Physical Exam  Constitutional: No distress.  Frail male in NAD  HENT:  Head: Normocephalic and atraumatic.  Mouth/Throat: Oropharynx is clear and moist. No oropharyngeal exudate.  Eyes: Conjunctivae and EOM are normal. Pupils are equal, round, and reactive to light.  Neck: Normal range of motion. Neck supple.  Cardiovascular: Normal rate, regular rhythm and normal heart sounds.   Pulmonary/Chest: Effort normal and breath sounds normal.  Chronic O2  Abdominal: Soft. Bowel sounds are normal.  Musculoskeletal: He exhibits no edema or tenderness.  Neurological: He is alert.  Slight jerking movements noted while asleep, pt wakes up and movement stops  Skin: Skin is warm and dry. He is not diaphoretic.    Labs reviewed: Basic Metabolic Panel:  Recent Labs  03/08/15 1515  03/29/15 0737  03/31/15 0305 04/01/15 0351 04/14/15 2000 05/04/15 08/18/15    NA  --   < > 138  < > 142 143 137 144 140  K  --   < > 3.6  < > 5.1 3.8 3.7 3.3* 4.3  CL  --   < > 101  < > 110 105 101  --   --   CO2  --   < > 21*  < > 23 25 25   --   --   GLUCOSE  --   < > 135*  < > 144* 121*  133*  --   --   BUN  --   < > 12  < > 31* 32* 16 7 20   CREATININE  --   < > 0.92  < > 1.09 0.90 1.48* 0.7 1.0  CALCIUM  --   < > 8.8*  < > 9.6 9.4 9.0  --   --   MG 1.7  --  1.9  --  2.1  --   --   --   --   < > = values in this interval not displayed. Liver Function Tests:  Recent Labs  03/29/15 0737 03/30/15 0350 03/31/15 0305  AST 18 18 35  ALT 10* 11* 12*  ALKPHOS 106 98 89  BILITOT 0.4 0.5 1.6*  PROT 6.7 7.0 6.5  ALBUMIN 3.3* 3.2* 3.1*   No results for input(s): LIPASE, AMYLASE in the last 8760 hours.  Recent Labs  02/05/15 0120  AMMONIA 10*   CBC:  Recent Labs  03/25/15 1825  03/29/15 0737  03/31/15 0305 04/01/15 0351 04/14/15 2000 05/04/15 08/18/15  WBC 6.1  --  9.9  < > 19.3* 11.6* 7.3 7.0 7.6  NEUTROABS 2.0  --  4.4  --   --   --  5.0  --   --   HGB 10.9*  < > 11.2*  < > 10.6* 10.6* 10.7* 10.0* 10.8*  HCT 33.7*  < > 34.2*  < > 32.5* 33.1* 32.5* 29*  --   MCV 90.6  --  90.0  < > 90.0 90.7 90.8  --   --   PLT 415*  --  365  < > 394 333 277 384 259  < > = values in this interval not displayed. TSH:  Recent Labs  01/13/15 1507 04/01/15 0351 04/08/15  TSH 3.834 3.736 4.80   A1C: Lab Results  Component Value Date   HGBA1C 6.1* 09/06/2014   Lipid Panel:  Recent Labs  10/04/14 1017 04/10/15  CHOL 179 138  HDL 50 38  LDLCALC 103* 81  TRIG 128 97  CHOLHDL 3.6  --     Assessment/Plan 1. Chronic atrial fibrillation (HCC) -rate controlled, conts on xarelto 20 mg daily  2. Chronic obstructive pulmonary disease, unspecified COPD type ([place]) Without recent exacerbation, conts on 02, adviar and PRN nebs  3. Hypothyroidism due to acquired atrophy of thyroid -TSH stable in June, will follow up at this time. conts on synthroid 25 mcg  4.  Myoclonus -staff reports involuntary movement when sleeping but stops when pt is awake -will follow up BMP, mg and TSH level at this time   5. Anemia, iron deficiency hgb stable on recent labs, cont on iron daily  6. BPH (benign prostatic hyperplasia) Stable on flomax  7. Essential hypertension -blood pressure at goal, conts on cozaar  Avelina Mcclurkin K. Harle Battiest  [facility] [phone] 8 am - 5 pm) [phone] (after hours)

## 2015-09-15 LAB — TSH: TSH: 5 u[IU]/mL (ref <=5.90)

## 2015-10-17 ENCOUNTER — Non-Acute Institutional Stay (SKILLED_NURSING_FACILITY): Payer: Medicare Other | Admitting: Nurse Practitioner

## 2015-10-17 DIAGNOSIS — F32A Depression, unspecified: Secondary | ICD-10-CM

## 2015-10-17 DIAGNOSIS — F329 Major depressive disorder, single episode, unspecified: Secondary | ICD-10-CM | POA: Diagnosis not present

## 2015-10-17 DIAGNOSIS — I482 Chronic atrial fibrillation, unspecified: Secondary | ICD-10-CM

## 2015-10-17 DIAGNOSIS — E034 Atrophy of thyroid (acquired): Secondary | ICD-10-CM

## 2015-10-17 DIAGNOSIS — R634 Abnormal weight loss: Secondary | ICD-10-CM | POA: Diagnosis not present

## 2015-10-17 DIAGNOSIS — J449 Chronic obstructive pulmonary disease, unspecified: Secondary | ICD-10-CM

## 2015-10-17 DIAGNOSIS — E038 Other specified hypothyroidism: Secondary | ICD-10-CM | POA: Diagnosis not present

## 2015-10-17 DIAGNOSIS — R1310 Dysphagia, unspecified: Secondary | ICD-10-CM

## 2015-10-17 NOTE — Progress Notes (Signed)
Patient ID: Todd [name], male   DOB: [birth year], 81 y.o.   MRN: [id number]    Nursing Home Location:  [facility]   Place of Service: SNF (60)  PCP: Odette Fraction, MD  No Known Allergies  Chief Complaint  Patient presents with  . Medical Management of Chronic Issues    HPI:  Patient is a 82 y.o. male seen today at [facility] for routine follow up on chronic conditions. Pt with a pmh of COPD, HTN, a fib, CHF, hypothyroidism, hyperlipemia. Pt has been doing well in the last month without acute illness or issues.pt reports he does not feel well today but without any specific complaints. Staff reports pt up at night and sleeping during the day. Eating and drinking well. With weight loss noted by RD. Double portions added as well as supplements. No acute concerns by nursing at this time.  Review of Systems: from staff and pt Review of Systems  Constitutional: Negative for activity change, appetite change, fatigue and unexpected weight change.  HENT: Positive for hearing loss (very HOH). Negative for congestion.   Eyes: Negative.   Respiratory: Negative for cough and shortness of breath.   Cardiovascular: Negative for chest pain, palpitations and leg swelling.  Gastrointestinal: Negative for abdominal pain, diarrhea and constipation.  Genitourinary: Negative for dysuria and difficulty urinating.  Musculoskeletal: Negative for myalgias and arthralgias.  Skin: Negative for color change and wound.  Neurological: Negative for dizziness.  Psychiatric/Behavioral: Negative for behavioral problems, confusion and agitation.       Memory loss    Past Medical History  Diagnosis Date  . Asthma   . Hypertension   . A-fib   . Hyperlipidemia   . Shortness of breath   . CHF (congestive heart failure) 10/28/2011  . Forgetfulness   . PVC's (premature ventricular contractions)   . Mild aortic insufficiency   . Dementia     Todd [name] 02/05/2015  . Failure to  thrive in adult     /notes 02/05/2015  . COPD (chronic obstructive pulmonary disease)   . Hypothyroidism     Todd [name] 02/05/2015  . Fall 02/04/2015    with altered mental status off of his baseline after he suffered a mechanical fall /notes 02/04/2015  . CAP (community acquired pneumonia)     Todd [name] 02/05/2015  . Colon cancer    Past Surgical History  Procedure Laterality Date  . Colon surgery      Partial hemecolectomy   . Central line insertion  09/08/2011        Social History:   reports that he has quit smoking. He does not have any smokeless tobacco history on file. He reports that he does not drink alcohol or use illicit drugs.  Family History  Problem Relation Age of Onset  . Hyperlipidemia Father   . Hypertension Father   . Heart disease Father   . Asthma Father   . Stroke Father   . Heart failure Father   . Heart disease Mother   . Heart failure Mother   . Heart attack Sister     Medications: Patient's Medications  New Prescriptions   No medications on file  Previous Medications   ACETAMINOPHEN (TYLENOL) 325 MG TABLET    Take 650 mg by mouth every 6 (six) hours as needed for mild pain.   ADVAIR DISKUS 500-50 MCG/DOSE AEPB    INHALE 1 PUFF TWICE A DAY   ARTIFICIAL TEAR SOLUTION OP    Apply to  eye. Instill 2 drops into both eyes four times daily as needed   FEEDING SUPPLEMENT, ENSURE ENLIVE, (ENSURE ENLIVE) LIQD    Take 237 mLs by mouth 2 (two) times daily between meals.   FERROUS SULFATE 325 (65 FE) MG TABLET    TAKE 1 TABLET BY MOUTH EVERY DAY   FLUTICASONE (FLONASE) 50 MCG/ACT NASAL SPRAY    Place 2 sprays into both nostrils daily.   GUAIFENESIN 200 MG TABLET    Take 200 mg by mouth every 4 (four) hours as needed for cough or to loosen phlegm.   IPRATROPIUM-ALBUTEROL (DUONEB) 0.5-2.5 (3) MG/3ML SOLN    Take 3 mLs by nebulization. Every 4-6 hours as needed for SOB/wheezing   LEVALBUTEROL (XOPENEX) 0.63 MG/3ML NEBULIZER SOLUTION    Take 3 mLs (0.63 mg total) by nebulization  every 4 (four) hours as needed for wheezing or shortness of breath.   LEVOTHYROXINE (SYNTHROID, LEVOTHROID) 25 MCG TABLET    TAKE 1 TABLET BY MOUTH EVERY DAY   LOSARTAN (COZAAR) 50 MG TABLET    Take 50 mg by mouth daily.   OMEPRAZOLE (PRILOSEC) 40 MG CAPSULE    Take 40 mg by mouth daily.   PRAVASTATIN (PRAVACHOL) 20 MG TABLET    Take 20 mg by mouth daily.   RIVAROXABAN (XARELTO) 20 MG TABS TABLET    Take 1 tablet (20 mg total) by mouth daily with supper.   TAMSULOSIN (FLOMAX) 0.4 MG CAPS CAPSULE    Take 1 capsule (0.4 mg total) by mouth daily.   VENLAFAXINE XR (EFFEXOR XR) 37.5 MG 24 HR CAPSULE    Take 2 capsules (75 mg total) by mouth daily with breakfast. 2 pills in AM  Modified Medications   No medications on file  Discontinued Medications   MICONAZOLE (MICOTIN) 2 % CREAM    Apply 1 application topically 2 (two) times daily. Apply to affected areas on back until resolved     Physical Exam: Filed Vitals:   10/17/15 1641  BP: 114/68  Pulse: 88  Temp: 98 F (36.7 C)  Resp: 20    Physical Exam  Constitutional: No distress.  Frail male in NAD  HENT:  Head: Normocephalic and atraumatic.  Mouth/Throat: Oropharynx is clear and moist. No oropharyngeal exudate.  Eyes: Conjunctivae and EOM are normal. Pupils are equal, round, and reactive to light.  Neck: Normal range of motion. Neck supple.  Cardiovascular: Normal rate, regular rhythm and normal heart sounds.   Pulmonary/Chest: Effort normal and breath sounds normal.  Chronic O2  Abdominal: Soft. Bowel sounds are normal.  Musculoskeletal: He exhibits no edema or tenderness.  Neurological: He is alert.  Skin: Skin is warm and dry. He is not diaphoretic.    Labs reviewed: Basic Metabolic Panel:  Recent Labs  03/08/15 1515  03/29/15 0737  03/31/15 0305 04/01/15 0351 04/14/15 2000 05/04/15 08/18/15  NA  --   < > 138  < > 142 143 137 144 140  K  --   < > 3.6  < > 5.1 3.8 3.7 3.3* 4.3  CL  --   < > 101  < > 110 105 101  --    --   CO2  --   < > 21*  < > 23 25 25   --   --   GLUCOSE  --   < > 135*  < > 144* 121* 133*  --   --   BUN  --   < > 12  < > 31* 32*  16 7 20   CREATININE  --   < > 0.92  < > 1.09 0.90 1.48* 0.7 1.0  CALCIUM  --   < > 8.8*  < > 9.6 9.4 9.0  --   --   MG 1.7  --  1.9  --  2.1  --   --   --   --   < > = values in this interval not displayed. Liver Function Tests:  Recent Labs  03/29/15 0737 03/30/15 0350 03/31/15 0305  AST 18 18 35  ALT 10* 11* 12*  ALKPHOS 106 98 89  BILITOT 0.4 0.5 1.6*  PROT 6.7 7.0 6.5  ALBUMIN 3.3* 3.2* 3.1*   No results for input(s): LIPASE, AMYLASE in the last 8760 hours.  Recent Labs  02/05/15 0120  AMMONIA 10*   CBC:  Recent Labs  03/25/15 1825  03/29/15 0737  03/31/15 0305 04/01/15 0351 04/14/15 2000 05/04/15 08/18/15  WBC 6.1  --  9.9  < > 19.3* 11.6* 7.3 7.0 7.6  NEUTROABS 2.0  --  4.4  --   --   --  5.0  --   --   HGB 10.9*  < > 11.2*  < > 10.6* 10.6* 10.7* 10.0* 10.8*  HCT 33.7*  < > 34.2*  < > 32.5* 33.1* 32.5* 29*  --   MCV 90.6  --  90.0  < > 90.0 90.7 90.8  --   --   PLT 415*  --  365  < > 394 333 277 384 259  < > = values in this interval not displayed. TSH:  Recent Labs  04/01/15 0351 04/08/15 09/15/15  TSH 3.736 4.80 5.00   A1C: Lab Results  Component Value Date   HGBA1C 6.1* 09/06/2014   Lipid Panel:  Recent Labs  04/10/15  CHOL 138  HDL 38  LDLCALC 81  TRIG 97    Assessment/Plan 1. Chronic atrial fibrillation (HCC) -rate controlled on current regimen, conts on xarelto for anticoagulation.   2. COPD Remains stable, without increase in shortness of breath, conts on advair BID, chronic O2 and PRN nebs  3. Dysphagia -stable, cont precautions, no signs of aspiration  4. Hypothyroidism due to acquired atrophy of thyroid TSH remains stable at 5 in November, will cont current synthroid dose  5. Loss of weight Weight of 134 lbs which is down overall. Due to progression of dementia. Pt has been added double  portion and eat well per staff. Followed by RD. conts on supplements.  6. Depression Remains stable on effexor, will cont current dose.    Carlos American. Harle Battiest  [facility] [phone] 8 am - 5 pm) [phone] (after hours)

## 2015-10-31 ENCOUNTER — Inpatient Hospital Stay (HOSPITAL_COMMUNITY)
Admission: EM | Admit: 2015-10-31 | Discharge: 2015-11-04 | DRG: 871 | Disposition: A | Discharge status: Skilled Nursing Facility | Payer: Medicare Other | Attending: Internal Medicine | Admitting: Internal Medicine

## 2015-10-31 ENCOUNTER — Non-Acute Institutional Stay (SKILLED_NURSING_FACILITY): Payer: Medicare Other | Admitting: Nurse Practitioner

## 2015-10-31 ENCOUNTER — Emergency Department (HOSPITAL_COMMUNITY): Payer: Medicare Other

## 2015-10-31 ENCOUNTER — Encounter (HOSPITAL_COMMUNITY): Payer: Self-pay | Admitting: Emergency Medicine

## 2015-10-31 DIAGNOSIS — J9601 Acute respiratory failure with hypoxia: Secondary | ICD-10-CM | POA: Diagnosis not present

## 2015-10-31 DIAGNOSIS — R4182 Altered mental status, unspecified: Secondary | ICD-10-CM

## 2015-10-31 DIAGNOSIS — J690 Pneumonitis due to inhalation of food and vomit: Secondary | ICD-10-CM | POA: Diagnosis present

## 2015-10-31 DIAGNOSIS — R1310 Dysphagia, unspecified: Secondary | ICD-10-CM | POA: Diagnosis present

## 2015-10-31 DIAGNOSIS — J45909 Unspecified asthma, uncomplicated: Secondary | ICD-10-CM | POA: Diagnosis present

## 2015-10-31 DIAGNOSIS — Z87891 Personal history of nicotine dependence: Secondary | ICD-10-CM | POA: Diagnosis not present

## 2015-10-31 DIAGNOSIS — I482 Chronic atrial fibrillation, unspecified: Secondary | ICD-10-CM | POA: Diagnosis present

## 2015-10-31 DIAGNOSIS — Z66 Do not resuscitate: Secondary | ICD-10-CM | POA: Diagnosis present

## 2015-10-31 DIAGNOSIS — A419 Sepsis, unspecified organism: Principal | ICD-10-CM | POA: Diagnosis present

## 2015-10-31 DIAGNOSIS — R509 Fever, unspecified: Secondary | ICD-10-CM | POA: Diagnosis not present

## 2015-10-31 DIAGNOSIS — Z85038 Personal history of other malignant neoplasm of large intestine: Secondary | ICD-10-CM | POA: Diagnosis not present

## 2015-10-31 DIAGNOSIS — E039 Hypothyroidism, unspecified: Secondary | ICD-10-CM | POA: Diagnosis present

## 2015-10-31 DIAGNOSIS — Y95 Nosocomial condition: Secondary | ICD-10-CM | POA: Diagnosis present

## 2015-10-31 DIAGNOSIS — G9340 Encephalopathy, unspecified: Secondary | ICD-10-CM | POA: Diagnosis present

## 2015-10-31 DIAGNOSIS — J449 Chronic obstructive pulmonary disease, unspecified: Secondary | ICD-10-CM | POA: Diagnosis not present

## 2015-10-31 DIAGNOSIS — I10 Essential (primary) hypertension: Secondary | ICD-10-CM | POA: Diagnosis present

## 2015-10-31 DIAGNOSIS — E038 Other specified hypothyroidism: Secondary | ICD-10-CM

## 2015-10-31 DIAGNOSIS — I351 Nonrheumatic aortic (valve) insufficiency: Secondary | ICD-10-CM | POA: Diagnosis present

## 2015-10-31 DIAGNOSIS — Z7901 Long term (current) use of anticoagulants: Secondary | ICD-10-CM

## 2015-10-31 DIAGNOSIS — E785 Hyperlipidemia, unspecified: Secondary | ICD-10-CM | POA: Diagnosis present

## 2015-10-31 DIAGNOSIS — I5032 Chronic diastolic (congestive) heart failure: Secondary | ICD-10-CM | POA: Diagnosis present

## 2015-10-31 DIAGNOSIS — E034 Atrophy of thyroid (acquired): Secondary | ICD-10-CM

## 2015-10-31 DIAGNOSIS — F0390 Unspecified dementia without behavioral disturbance: Secondary | ICD-10-CM | POA: Diagnosis present

## 2015-10-31 DIAGNOSIS — J189 Pneumonia, unspecified organism: Secondary | ICD-10-CM

## 2015-10-31 DIAGNOSIS — R627 Adult failure to thrive: Secondary | ICD-10-CM | POA: Diagnosis present

## 2015-10-31 DIAGNOSIS — E43 Unspecified severe protein-calorie malnutrition: Secondary | ICD-10-CM | POA: Diagnosis present

## 2015-10-31 DIAGNOSIS — E440 Moderate protein-calorie malnutrition: Secondary | ICD-10-CM | POA: Insufficient documentation

## 2015-10-31 LAB — URINALYSIS, ROUTINE W REFLEX MICROSCOPIC
Bilirubin Urine: NEGATIVE
GLUCOSE, UA: NEGATIVE mg/dL
Hgb urine dipstick: NEGATIVE
KETONES UR: NEGATIVE mg/dL
LEUKOCYTES UA: NEGATIVE
NITRITE: NEGATIVE
PROTEIN: NEGATIVE mg/dL
Specific Gravity, Urine: 1.014 (ref 1.005–1.030)
pH: 6.5 (ref 5.0–8.0)

## 2015-10-31 LAB — CBC WITH DIFFERENTIAL/PLATELET
BASOS ABS: 0 10*3/uL (ref 0.0–0.1)
BASOS PCT: 1 %
Eosinophils Absolute: 0.1 10*3/uL (ref 0.0–0.7)
Eosinophils Relative: 2 %
HEMATOCRIT: 26.3 % — AB (ref 39.0–52.0)
Hemoglobin: 8.8 g/dL — ABNORMAL LOW (ref 13.0–17.0)
Lymphocytes Relative: 19 %
Lymphs Abs: 1.2 10*3/uL (ref 0.7–4.0)
MCH: 30.7 pg (ref 26.0–34.0)
MCHC: 33.5 g/dL (ref 30.0–36.0)
MCV: 91.6 fL (ref 78.0–100.0)
Monocytes Absolute: 1.2 10*3/uL — ABNORMAL HIGH (ref 0.1–1.0)
Monocytes Relative: 18 %
NEUTROS ABS: 3.9 10*3/uL (ref 1.7–7.7)
Neutrophils Relative %: 60 %
Platelets: 213 10*3/uL (ref 150–400)
RBC: 2.87 MIL/uL — AB (ref 4.22–5.81)
RDW: 14.9 % (ref 11.5–15.5)
WBC: 6.4 10*3/uL (ref 4.0–10.5)

## 2015-10-31 LAB — COMPREHENSIVE METABOLIC PANEL
ALT: 11 U/L — ABNORMAL LOW (ref 17–63)
AST: 15 U/L (ref 15–41)
Albumin: 2.5 g/dL — ABNORMAL LOW (ref 3.5–5.0)
Alkaline Phosphatase: 72 U/L (ref 38–126)
Anion gap: 8 (ref 5–15)
BILIRUBIN TOTAL: 0.5 mg/dL (ref 0.3–1.2)
BUN: 12 mg/dL (ref 6–20)
CALCIUM: 7.3 mg/dL — AB (ref 8.9–10.3)
CO2: 21 mmol/L — ABNORMAL LOW (ref 22–32)
Chloride: 112 mmol/L — ABNORMAL HIGH (ref 101–111)
Creatinine, Ser: 0.72 mg/dL (ref 0.61–1.24)
GFR calc Af Amer: >60 mL/min (ref >60)
Glucose, Bld: 99 mg/dL (ref 65–99)
Potassium: 3.7 mmol/L (ref 3.5–5.1)
Sodium: 141 mmol/L (ref 135–145)
TOTAL PROTEIN: 5.1 g/dL — AB (ref 6.5–8.1)

## 2015-10-31 LAB — BRAIN NATRIURETIC PEPTIDE: B NATRIURETIC PEPTIDE 5: 382.1 pg/mL — AB (ref 0.0–100.0)

## 2015-10-31 LAB — MRSA PCR SCREENING: MRSA by PCR: POSITIVE — AB

## 2015-10-31 LAB — I-STAT CG4 LACTIC ACID, ED: Lactic Acid, Venous: 1.38 mmol/L (ref 0.5–2.0)

## 2015-10-31 MED ORDER — SODIUM CHLORIDE 0.9 % IV BOLUS (SEPSIS)
500.0000 mL | Freq: Once | INTRAVENOUS | Status: AC
  Administered 2015-10-31: 500 mL via INTRAVENOUS

## 2015-10-31 MED ORDER — LEVOTHYROXINE SODIUM 100 MCG IV SOLR
12.5000 ug | Freq: Every day | INTRAVENOUS | Status: DC
  Administered 2015-11-01: 12.5 ug via INTRAVENOUS
  Filled 2015-10-31: qty 5

## 2015-10-31 MED ORDER — METHYLPREDNISOLONE SODIUM SUCC 125 MG IJ SOLR
60.0000 mg | Freq: Two times a day (BID) | INTRAMUSCULAR | Status: DC
  Administered 2015-10-31 – 2015-11-02 (×4): 60 mg via INTRAVENOUS
  Filled 2015-10-31 (×4): qty 2

## 2015-10-31 MED ORDER — ONDANSETRON HCL 4 MG/2ML IJ SOLN: 4.0000 mg | Freq: Three times a day (TID) | INTRAMUSCULAR | Status: AC | PRN

## 2015-10-31 MED ORDER — VANCOMYCIN HCL 500 MG IV SOLR
500.0000 mg | Freq: Two times a day (BID) | INTRAVENOUS | Status: DC
  Administered 2015-11-01 – 2015-11-03 (×5): 500 mg via INTRAVENOUS
  Filled 2015-10-31 (×8): qty 500

## 2015-10-31 MED ORDER — SODIUM CHLORIDE 0.9 % IV SOLN
INTRAVENOUS | Status: AC
  Administered 2015-10-31: 18:00:00 via INTRAVENOUS

## 2015-10-31 MED ORDER — LEVALBUTEROL HCL 0.63 MG/3ML IN NEBU: 0.6300 mg | INHALATION_SOLUTION | RESPIRATORY_TRACT | Status: DC | PRN

## 2015-10-31 MED ORDER — PIPERACILLIN-TAZOBACTAM 3.375 G IVPB 30 MIN
3.3750 g | Freq: Once | INTRAVENOUS | Status: AC
  Administered 2015-10-31: 3.375 g via INTRAVENOUS
  Filled 2015-10-31: qty 50

## 2015-10-31 MED ORDER — PIPERACILLIN-TAZOBACTAM 3.375 G IVPB
3.3750 g | Freq: Three times a day (TID) | INTRAVENOUS | Status: DC
  Administered 2015-10-31 – 2015-11-04 (×10): 3.375 g via INTRAVENOUS
  Filled 2015-10-31 (×13): qty 50

## 2015-10-31 MED ORDER — CHLORHEXIDINE GLUCONATE CLOTH 2 % EX PADS
6.0000 | MEDICATED_PAD | Freq: Every day | CUTANEOUS | Status: DC
  Administered 2015-11-01 – 2015-11-04 (×4): 6 via TOPICAL

## 2015-10-31 MED ORDER — VANCOMYCIN HCL IN DEXTROSE 750-5 MG/150ML-% IV SOLN
750.0000 mg | Freq: Once | INTRAVENOUS | Status: AC
  Administered 2015-10-31: 750 mg via INTRAVENOUS
  Filled 2015-10-31 (×3): qty 150

## 2015-10-31 MED ORDER — ENOXAPARIN SODIUM 40 MG/0.4ML ~~LOC~~ SOLN
40.0000 mg | SUBCUTANEOUS | Status: DC
  Administered 2015-10-31: 40 mg via SUBCUTANEOUS
  Filled 2015-10-31: qty 0.4

## 2015-10-31 MED ORDER — SODIUM CHLORIDE 0.9 % IV SOLN
Freq: Once | INTRAVENOUS | Status: AC
  Administered 2015-10-31: 13:00:00 via INTRAVENOUS

## 2015-10-31 MED ORDER — MUPIROCIN 2 % EX OINT
1.0000 "application " | TOPICAL_OINTMENT | Freq: Two times a day (BID) | CUTANEOUS | Status: DC
  Administered 2015-10-31 – 2015-11-04 (×8): 1 via NASAL
  Filled 2015-10-31: qty 22

## 2015-10-31 NOTE — ED Notes (Signed)
Pt from [place]. Staff called for altered mental status. Temp 102 per staff. Pt responsive to painful stimuli, recently treated for PNA. Afib 82 with hx of this. CBG 124, BP 146/90. Unsure of pt's mental baseline

## 2015-10-31 NOTE — Progress Notes (Signed)
Patient ID: Todd [name], male   DOB: 1924/08/18, 81 y.o.   MRN: [id number]    Nursing Home Location:  [facility]   Place of Service: SNF (9)  PCP: Odette Fraction, MD  No Known Allergies  Chief Complaint  Patient presents with  . Acute Visit    HPI:  Patient is a 81 y.o. male seen today at [facility] due to decline in status. Pt with a pmh of COPD, HTN, a fib, CHF, hypothyroidism, hyperlipemia, dementia and FTT. Nursing noted episode of vomiting brown emesis last night. Pt was alert at that time but confused. Now with decrease LOC and not following commands. Nursing had hard time getting pt to take medication this morning and pt with decrease PO intake. Pt with low grade temp last night and has increased to 100.4 at this time.  Review of Systems:  Review of Systems  Unable to perform ROS: Acuity of condition    Past Medical History  Diagnosis Date  . Asthma   . Hypertension   . A-fib   . Hyperlipidemia   . Shortness of breath   . CHF (congestive heart failure) 10/28/2011  . Forgetfulness   . PVC's (premature ventricular contractions)   . Mild aortic insufficiency   . Dementia     [name] 02/05/2015  . Failure to thrive in adult     /notes 02/05/2015  . COPD (chronic obstructive pulmonary disease)   . Hypothyroidism     [name] 02/05/2015  . Fall 02/04/2015    with altered mental status off of his baseline after he suffered a mechanical fall /notes 02/04/2015  . CAP (community acquired pneumonia)     [name] 02/05/2015  . Colon cancer    Past Surgical History  Procedure Laterality Date  . Colon surgery      Partial hemecolectomy   . Central line insertion  09/08/2011        Social History:   reports that he has quit smoking. He does not have any smokeless tobacco history on file. He reports that he does not drink alcohol or use illicit drugs.  Family History  Problem Relation Age of Onset  . Hyperlipidemia Father   . Hypertension  Father   . Heart disease Father   . Asthma Father   . Stroke Father   . Heart failure Father   . Heart disease Mother   . Heart failure Mother   . Heart attack Sister     Medications: Patient's Medications  New Prescriptions   No medications on file  Previous Medications   ACETAMINOPHEN (TYLENOL) 325 MG TABLET    Take 650 mg by mouth every 6 (six) hours as needed for mild pain.   ADVAIR DISKUS 500-50 MCG/DOSE AEPB    INHALE 1 PUFF TWICE A DAY   ARTIFICIAL TEAR SOLUTION OP    Apply to eye. Instill 2 drops into both eyes four times daily as needed   FEEDING SUPPLEMENT, ENSURE ENLIVE, (ENSURE ENLIVE) LIQD    Take 237 mLs by mouth 2 (two) times daily between meals.   FERROUS SULFATE 325 (65 FE) MG TABLET    TAKE 1 TABLET BY MOUTH EVERY DAY   FLUTICASONE (FLONASE) 50 MCG/ACT NASAL SPRAY    Place 2 sprays into both nostrils daily.   GUAIFENESIN 200 MG TABLET    Take 200 mg by mouth every 4 (four) hours as needed for cough or to loosen phlegm.   IPRATROPIUM-ALBUTEROL (DUONEB) 0.5-2.5 (3) MG/3ML  SOLN    Take 3 mLs by nebulization. Every 4-6 hours as needed for SOB/wheezing   LEVALBUTEROL (XOPENEX) 0.63 MG/3ML NEBULIZER SOLUTION    Take 3 mLs (0.63 mg total) by nebulization every 4 (four) hours as needed for wheezing or shortness of breath.   LEVOTHYROXINE (SYNTHROID, LEVOTHROID) 25 MCG TABLET    TAKE 1 TABLET BY MOUTH EVERY DAY   LOSARTAN (COZAAR) 50 MG TABLET    Take 50 mg by mouth daily.   OMEPRAZOLE (PRILOSEC) 40 MG CAPSULE    Take 40 mg by mouth daily.   PRAVASTATIN (PRAVACHOL) 20 MG TABLET    Take 20 mg by mouth daily.   RIVAROXABAN (XARELTO) 20 MG TABS TABLET    Take 1 tablet (20 mg total) by mouth daily with supper.   TAMSULOSIN (FLOMAX) 0.4 MG CAPS CAPSULE    Take 1 capsule (0.4 mg total) by mouth daily.   VENLAFAXINE XR (EFFEXOR XR) 37.5 MG 24 HR CAPSULE    Take 2 capsules (75 mg total) by mouth daily with breakfast. 2 pills in AM  Modified Medications   No medications on file    Discontinued Medications   No medications on file     Physical Exam: Filed Vitals:   10/31/15 1153  BP: 125/88  Pulse: 110  Temp: 100.4 F (38 C)  Resp: 22  SpO2: 96%    Physical Exam  Constitutional: He appears lethargic. No distress.  Frail male in NAD  HENT:  Mouth/Throat: Oropharynx is clear and moist. No oropharyngeal exudate.  Eyes: Conjunctivae and EOM are normal. Pupils are equal, round, and reactive to light.  Cardiovascular: Regular rhythm and normal heart sounds.  Tachycardia present.   Pulmonary/Chest: Effort normal.  Chronic O2, diminished breath sounds throughout  Abdominal: Soft. Bowel sounds are normal.  Musculoskeletal: He exhibits no edema or tenderness.  Neurological: He appears lethargic.  Does not follow commands   Skin: Skin is warm and dry. He is not diaphoretic.    Labs reviewed: Basic Metabolic Panel:  Recent Labs  03/08/15 1515  03/29/15 0737  03/31/15 0305 04/01/15 0351 04/14/15 2000 05/04/15 08/18/15  NA  --   < > 138  < > 142 143 137 144 140  K  --   < > 3.6  < > 5.1 3.8 3.7 3.3* 4.3  CL  --   < > 101  < > 110 105 101  --   --   CO2  --   < > 21*  < > 23 25 25   --   --   GLUCOSE  --   < > 135*  < > 144* 121* 133*  --   --   BUN  --   < > 12  < > 31* 32* 16 7 20   CREATININE  --   < > 0.92  < > 1.09 0.90 1.48* 0.7 1.0  CALCIUM  --   < > 8.8*  < > 9.6 9.4 9.0  --   --   MG 1.7  --  1.9  --  2.1  --   --   --   --   < > = values in this interval not displayed. Liver Function Tests:  Recent Labs  03/29/15 0737 03/30/15 0350 03/31/15 0305  AST 18 18 35  ALT 10* 11* 12*  ALKPHOS 106 98 89  BILITOT 0.4 0.5 1.6*  PROT 6.7 7.0 6.5  ALBUMIN 3.3* 3.2* 3.1*   No results for input(s): LIPASE, AMYLASE  in the last 8760 hours.  Recent Labs  02/05/15 0120  AMMONIA 10*   CBC:  Recent Labs  03/25/15 1825  03/29/15 0737  03/31/15 0305 04/01/15 0351 04/14/15 2000 05/04/15 08/18/15  WBC 6.1  --  9.9  < > 19.3* 11.6* 7.3 7.0 7.6   NEUTROABS 2.0  --  4.4  --   --   --  5.0  --   --   HGB 10.9*  < > 11.2*  < > 10.6* 10.6* 10.7* 10.0* 10.8*  HCT 33.7*  < > 34.2*  < > 32.5* 33.1* 32.5* 29*  --   MCV 90.6  --  90.0  < > 90.0 90.7 90.8  --   --   PLT 415*  --  365  < > 394 333 277 384 259  < > = values in this interval not displayed. TSH:  Recent Labs  04/01/15 0351 04/08/15 09/15/15  TSH 3.736 4.80 5.00   A1C: Lab Results  Component Value Date   HGBA1C 6.1* 09/06/2014   Lipid Panel:  Recent Labs  04/10/15  CHOL 138  HDL 38  LDLCALC 81  TRIG 97    Radiological Exams: 10/21/15 -interval development of mild right perihilar infiltrates   Assessment/Plan 1. Dysphagia On nectar thick liquids, had been swallowing with out difficulty. Last night had episodes of vomiting and decreased lung sounds. CXR obtained with blood work. Blood work not available   2. Aspiration pneumonia of right lung due to vomit, unspecified part of lung ([place]) -pt with increase RR, tachycardia and decrease lungs sounds. Now with increase temp and decrease LOC.  3. Altered mental status  Daughter called to discussed goals of care since pt is 81 years old with dementia and she would like aggressive treatment and pt to be sent to emergency room for further evaluation and treatment.   Pt sent to hospital via EMS     [place]. Harle Battiest  [facility] [phone] 8 am - 5 pm) [phone] (after hours)

## 2015-10-31 NOTE — Progress Notes (Addendum)
ANTIBIOTIC CONSULT NOTE - INITIAL  Pharmacy Consult for Zosyn and Vancomycin Indication: rule out sepsis  No Known Allergies  Patient Measurements: Weight: 134 lb (60.782 kg)  Vital Signs: Temp: 102.3 F (39.1 C) (12/30 1307) Temp Source: Rectal (12/30 1307) BP: 109/71 mmHg (12/30 1300) Pulse Rate: 86 (12/30 1300) Intake/Output from previous day:   Intake/Output from this shift:    Labs: No results for input(s): WBC, HGB, PLT, LABCREA, CREATININE in the last 72 hours. CrCl cannot be calculated (Patient has no serum creatinine result on file.). No results for input(s): VANCOTROUGH, VANCOPEAK, VANCORANDOM, GENTTROUGH, GENTPEAK, GENTRANDOM, TOBRATROUGH, TOBRAPEAK, TOBRARND, AMIKACINPEAK, AMIKACINTROU, AMIKACIN in the last 72 hours.   Microbiology: No results found for this or any previous visit (from the past 720 hour(s)).  Medical History: Past Medical History  Diagnosis Date  . Asthma   . Hypertension   . A-fib ([place])   . Hyperlipidemia   . Shortness of breath   . CHF (congestive heart failure) ([place]) 10/28/2011  . Forgetfulness   . PVC's (premature ventricular contractions)   . Mild aortic insufficiency   . Dementia     [name] 02/05/2015  . Failure to thrive in adult     /notes 02/05/2015  . COPD (chronic obstructive pulmonary disease) ([place])   . Hypothyroidism     [name] 02/05/2015  . Fall 02/04/2015    with altered mental status off of his baseline after he suffered a mechanical fall /notes 02/04/2015  . CAP (community acquired pneumonia)     [name] 02/05/2015  . Colon cancer [facility])    Assessment: 81 yo M presents to ED from [facility] with c/o of vomiting and altered mental status. Tmax in ED 102.3, labs pending. UA negative and cultures are pending.  Goal of Therapy:  Eradication of infection  Plan:  - Zosyn 3.375g IV (30 min infusion) x1, followed by Zosyn 3.375g IV q8h - Monitor C&S, vitals and clinical progression.  Dimitri Ped, PharmD. PGY-1 Pharmacy  Resident Pager: [phone]  10/31/2015,1:40 PM  Pharmacy Code Sepsis Protocol  Time of code sepsis page: 1308 Time of antibiotic delivery: 1345  Were antibiotics ordered at the time of the code sepsis page? No Was it required to contact the physician? []  Physician not contacted [x]  Physician contacted to order antibiotics for code sepsis []  Physician contacted to recommend changing antibiotics  Pharmacy consulted for: Zosyn  Anti-infectives    Start     Dose/Rate Route Frequency Ordered Stop   10/31/15 1930  piperacillin-tazobactam (ZOSYN) IVPB 3.375 g     3.375 g 12.5 mL/hr over 240 Minutes Intravenous 3 times per day 10/31/15 1340     10/31/15 1345  piperacillin-tazobactam (ZOSYN) IVPB 3.375 g     3.375 g 100 mL/hr over 30 Minutes Intravenous  Once 10/31/15 [place] Stone, PharmD 10/31/2015, 1:45 PM   Addendum: Pharmacy consulted to add vancomycin.    Plan: 1) Vancomycin 750mg  IV x 1 then 500mg  IV q12   Nena Jordan, PharmD, BCPS 10/31/2015, 4:00PM

## 2015-10-31 NOTE — ED Notes (Signed)
Daughter Neoma Laming called, reported staff at [facility] told her this am at 0700 pt was vomiting and that chest xray possibly indicated pneumonia.  Daughter asked to be kept informed.    [phone]

## 2015-10-31 NOTE — H&P (Signed)
History and Physical    Todd [name] [id number] DOB: February 14, 1924 DOA: 10/31/2015  Referring physician: Dr. Audie Pinto PCP: Odette Fraction, MD  Specialists: None   Chief Complaint: acute encephalopathy  HPI: Todd [name] is a 81 y.o. male has a past medical history significant for COPD, Asthma, chronic A fib, HLD, diastolic CHF, is being brought from his SNF due to AMS and fever. Limited history as patient altered and is per EDP and notes and in discussing with patient's daughter. Apparently he has been having few episodes of vomiting, then became lethargic and febrile and was brought to the ED. In the ED he appears septic with fever, tachycardia, tachypnea. He is not hypotensive. Blood work with mild anemia, no leukocytosis, renal function is normal. CXR without acute findings, UA unremarkable. TRH asked for admission for presumed sepsis from aspiration pneumonia.   Review of Systems: unable to obtain ROS due to AMS  Past Medical History  Diagnosis Date  . Asthma   . Hypertension   . A-fib ([place])   . Hyperlipidemia   . Shortness of breath   . CHF (congestive heart failure) ([place]) 10/28/2011  . Forgetfulness   . PVC's (premature ventricular contractions)   . Mild aortic insufficiency   . Dementia     [name] 02/05/2015  . Failure to thrive in adult     /notes 02/05/2015  . COPD (chronic obstructive pulmonary disease) ([place])   . Hypothyroidism     [name] 02/05/2015  . Fall 02/04/2015    with altered mental status off of his baseline after he suffered a mechanical fall /notes 02/04/2015  . CAP (community acquired pneumonia)     [name] 02/05/2015  . Colon cancer [facility])    Past Surgical History  Procedure Laterality Date  . Colon surgery      Partial hemecolectomy   . Central line insertion  09/08/2011        Social History:  reports that he has quit smoking. He does not have any smokeless tobacco history on file. He reports that he does not drink alcohol or use illicit  drugs.  No Known Allergies  Family History  Problem Relation Age of Onset  . Hyperlipidemia Father   . Hypertension Father   . Heart disease Father   . Asthma Father   . Stroke Father   . Heart failure Father   . Heart disease Mother   . Heart failure Mother   . Heart attack Sister     Prior to Admission medications   Medication Sig Start Date End Date Taking? Authorizing Provider  acetaminophen (TYLENOL) 325 MG tablet Take 650 mg by mouth every 6 (six) hours as needed for mild pain.   Yes Historical Provider, MD  ADVAIR DISKUS 500-50 MCG/DOSE AEPB INHALE 1 PUFF TWICE A DAY 12/23/14  Yes Susy Frizzle, MD  ARTIFICIAL TEAR SOLUTION OP Apply to eye. Instill 2 drops into both eyes four times daily as needed   Yes Historical Provider, MD  feeding supplement, ENSURE ENLIVE, (ENSURE ENLIVE) LIQD Take 237 mLs by mouth 2 (two) times daily between meals. Patient taking differently: Take 237 mLs by mouth 3 (three) times daily between meals.  03/11/15  Yes Geradine Girt, DO  ferrous sulfate 325 (65 FE) MG tablet TAKE 1 TABLET BY MOUTH EVERY DAY 12/23/14  Yes Susy Frizzle, MD  fluticasone [facility]) 50 MCG/ACT nasal spray Place 2 sprays into both nostrils daily.   Yes Historical Provider, MD  guaiFENesin 200 MG  tablet Take 200 mg by mouth every 4 (four) hours as needed for cough or to loosen phlegm.   Yes Historical Provider, MD  ipratropium-albuterol (DUONEB) 0.5-2.5 (3) MG/3ML SOLN Take 3 mLs by nebulization. Every 4-6 hours as needed for SOB/wheezing   Yes Historical Provider, MD  levalbuterol (XOPENEX) 0.63 MG/3ML nebulizer solution Take 3 mLs (0.63 mg total) by nebulization every 4 (four) hours as needed for wheezing or shortness of breath. 03/11/15  Yes Geradine Girt, DO  levothyroxine (SYNTHROID, LEVOTHROID) 25 MCG tablet TAKE 1 TABLET BY MOUTH EVERY DAY 03/27/15  Yes Susy Frizzle, MD  losartan (COZAAR) 50 MG tablet Take 50 mg by mouth daily. 01/23/15  Yes Historical Provider, MD   omeprazole (PRILOSEC) 40 MG capsule Take 40 mg by mouth daily.   Yes Historical Provider, MD  potassium chloride SA (K-DUR,KLOR-CON) 20 MEQ tablet Take 40 mEq by mouth daily. 10/30/15  Yes Historical Provider, MD  pravastatin (PRAVACHOL) 20 MG tablet Take 20 mg by mouth daily.   Yes Historical Provider, MD  rivaroxaban (XARELTO) 20 MG TABS tablet Take 1 tablet (20 mg total) by mouth daily with supper. 03/11/15  Yes Geradine Girt, DO  tamsulosin (FLOMAX) 0.4 MG CAPS capsule Take 1 capsule (0.4 mg total) by mouth daily. 03/11/15  Yes Geradine Girt, DO  venlafaxine XR (EFFEXOR XR) 37.5 MG 24 hr capsule Take 2 capsules (75 mg total) by mouth daily with breakfast. 2 pills in AM 03/11/15  Yes Geradine Girt, DO   Physical Exam: Filed Vitals:   10/31/15 1307 10/31/15 1330 10/31/15 1345 10/31/15 1400  BP:  108/46 103/63 102/49  Pulse:  76 70 70  Temp: 102.3 F (39.1 C)     TempSrc: Rectal     Resp:  30 25 19   Weight:      SpO2:  94% 96% 95%     GENERAL: altered, opens eyes to voice but does not respond.  HEENT: head NCAT, no scleral icterus.   NECK: Supple. No carotid bruits. No lymphadenopathy or thyromegaly.  LUNGS: coarse breath sounds throughout, gurgling sounds. Scant wheezes   HEART: Regular rate and rhythm without murmur. 2+ pulses, no JVD, no peripheral edema  ABDOMEN: Soft, nondistended.  EXTREMITIES: Without any cyanosis, clubbing, rash, lesions  NEUROLOGIC: Alert and oriented x0. Does not follow commands    Labs on Admission:  Basic Metabolic Panel:  Recent Labs Lab 10/31/15 1350  NA 141  K 3.7  CL 112*  CO2 21*  GLUCOSE 99  BUN 12  CREATININE 0.72  CALCIUM 7.3*   Liver Function Tests:  Recent Labs Lab 10/31/15 1350  AST 15  ALT 11*  ALKPHOS 72  BILITOT 0.5  PROT 5.1*  ALBUMIN 2.5*   CBC:  Recent Labs Lab 10/31/15 1350  WBC 6.4  NEUTROABS 3.9  HGB 8.8*  HCT 26.3*  MCV 91.6  PLT 213   BNP (last 3 results)  Recent Labs  02/05/15 0105  03/29/15 0422 10/31/15 1350  BNP 144.1* 327.5* 382.1*   Radiological Exams on Admission: Dg Chest Port 1 View  10/31/2015  CLINICAL DATA:  Sepsis EXAM: PORTABLE CHEST - 1 VIEW COMPARISON:  04/14/2015 FINDINGS: Cardiac shadow is mildly prominent but stable. Aortic calcifications are again seen. Mild apical scarring is noted and stable. Mild chronic interstitial changes are seen. No focal infiltrate or sizable effusion is noted. IMPRESSION: Chronic changes without acute abnormality. Electronically Signed   By: Inez Catalina M.D.   On: 10/31/2015 13:29  EKG: Independently reviewed.  Assessment/Plan Principal Problem:   Sepsis ([place]) Active Problems:   HLD (hyperlipidemia)   Chronic atrial fibrillation (HCC)   Acute respiratory failure with hypoxia (HCC)   Chronic diastolic CHF (congestive heart failure) (HCC)   Hypothyroidism   Failure to thrive in adult   HCAP (healthcare-associated pneumonia)   Acute encephalopathy   COPD (chronic obstructive pulmonary disease) (HCC)   DNR (do not resuscitate)   Sepsis likely due to HCAP vs aspiration pneumonia  - will admit patient to stepdown, start empiric antibiotics with Vancomycin and Zosyn - NPO for now - known dysphagia, cannot rule out aspiration as the etiology, CXR is clear currently, will repeat in am  Acute respiratory failure with hypoxia due to aspiration - oxygen as needed - patient has a durable DNR  COPD - wheezing on admission,  - IV steroids, antibiotics, nebulizers  Chronic diastolic heart failure  - Seems to be euvolemic currently - gentle hydration overnight   Chronic atrial fibrillation - hold Xarelto as he is NPO, unknown last dose  Hypothyroidism  - change synthroid to IV as he is NPO  Severe protein caloric malnutrition  Goals of care - I discussed with patient's daughter Todd [name] [phone]) that Mr. Willmann is critically ill with respiratory failure, FTT, and overall very weak with poor reserves. He is  DNR, however she wishes medical treatment with fluids, antibiotics, supplemental oxygen. She understands that he might not make it but hopes for the best. Will place patient in SDU and avoid further aggressive measures, daughter would like to be contacted if his condition is deteriorating. She currently wishes not to visit because she has an URI. If he continues to get worse may need to focus on his comfort and dignity and making sure that he is not suffering and she understands.    Diet: NPO Fluids: NS DVT Prophylaxis: SCD  Code Status: DNR  Family Communication: d/w daughter over the phone   Disposition Plan: admit to SDU    Costin M. Cruzita Lederer, MD [facility] Pager [phone]  If 7PM-7AM, please contact night-coverage [email or web address] Password [email or web address] 10/31/2015, 3:47 PM

## 2015-10-31 NOTE — Progress Notes (Signed)
Patient experiencing multiple apneic periods updated MD received no new orders at this time.

## 2015-10-31 NOTE — ED Notes (Signed)
Activated code sepsis  

## 2015-10-31 NOTE — Progress Notes (Addendum)
Positive MRSA PCR called up for patient, MRSA order set initiated per protocol, Bactroban administered and maintained, patient placed on contact precautions.

## 2015-10-31 NOTE — ED Notes (Signed)
This RN made Dr. Audie Pinto aware of BP.

## 2015-10-31 NOTE — ED Provider Notes (Signed)
CSN: [id number]     Arrival date & time 10/31/15  1244 History   First MD Initiated Contact with Patient 10/31/15 1245     Chief Complaint  Patient presents with  . Altered Mental Status      HPI Pt from [facility]. Staff called for altered mental status. Temp 102 per staff. Pt responsive to painful stimuli, recently treated for PNA. Afib 82 with hx of this. CBG 124, BP 146/90. Unsure of pt's mental baseline Past Medical History  Diagnosis Date  . Asthma   . Hypertension   . A-fib ([place])   . Hyperlipidemia   . Shortness of breath   . CHF (congestive heart failure) ([place]) 10/28/2011  . Forgetfulness   . PVC's (premature ventricular contractions)   . Mild aortic insufficiency   . Dementia     [name] 02/05/2015  . Failure to thrive in adult     /notes 02/05/2015  . COPD (chronic obstructive pulmonary disease) ([place])   . Hypothyroidism     [name] 02/05/2015  . Fall 02/04/2015    with altered mental status off of his baseline after he suffered a mechanical fall /notes 02/04/2015  . CAP (community acquired pneumonia)     [name] 02/05/2015  . Colon cancer [facility])    Past Surgical History  Procedure Laterality Date  . Colon surgery      Partial hemecolectomy   . Central line insertion  09/08/2011        Family History  Problem Relation Age of Onset  . Hyperlipidemia Father   . Hypertension Father   . Heart disease Father   . Asthma Father   . Stroke Father   . Heart failure Father   . Heart disease Mother   . Heart failure Mother   . Heart attack Sister    Social History  Substance Use Topics  . 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 Smokeless tobacco: None     Comment: quit 30+ yrs ago  . Alcohol Use: No    Review of Systems  Unable to perform ROS: Mental status change      Allergies  Review of patient's allergies indicates no known allergies.  Home Medications   Prior to Admission medications   Medication Sig Start Date End Date Taking? Authorizing Provider  acetaminophen  (TYLENOL) 325 MG tablet Take 650 mg by mouth every 6 (six) hours as needed for mild pain.   Yes Historical Provider, MD  ADVAIR DISKUS 500-50 MCG/DOSE AEPB INHALE 1 PUFF TWICE A DAY 12/23/14  Yes Susy Frizzle, MD  ARTIFICIAL TEAR SOLUTION OP Apply to eye. Instill 2 drops into both eyes four times daily as needed   Yes Historical Provider, MD  feeding supplement, ENSURE ENLIVE, (ENSURE ENLIVE) LIQD Take 237 mLs by mouth 2 (two) times daily between meals. Patient taking differently: Take 237 mLs by mouth 3 (three) times daily between meals.  03/11/15  Yes Geradine Girt, DO  ferrous sulfate 325 (65 FE) MG tablet TAKE 1 TABLET BY MOUTH EVERY DAY 12/23/14  Yes Susy Frizzle, MD  fluticasone [facility]) 50 MCG/ACT nasal spray Place 2 sprays into both nostrils daily.   Yes Historical Provider, MD  guaiFENesin 200 MG tablet Take 200 mg by mouth every 4 (four) hours as needed for cough or to loosen phlegm.   Yes Historical Provider, MD  ipratropium-albuterol (DUONEB) 0.5-2.5 (3) MG/3ML SOLN Take 3 mLs by nebulization. Every 4-6 hours as needed for SOB/wheezing   Yes Historical Provider, MD  levalbuterol (XOPENEX) 0.63 MG/3ML nebulizer solution Take 3 mLs (0.63 mg total) by nebulization every 4 (four) hours as needed for wheezing or shortness of breath. 03/11/15  Yes Geradine Girt, DO  levothyroxine (SYNTHROID, LEVOTHROID) 25 MCG tablet TAKE 1 TABLET BY MOUTH EVERY DAY 03/27/15  Yes Susy Frizzle, MD  losartan (COZAAR) 50 MG tablet Take 50 mg by mouth daily. 01/23/15  Yes Historical Provider, MD  omeprazole (PRILOSEC) 40 MG capsule Take 40 mg by mouth daily.   Yes Historical Provider, MD  potassium chloride SA (K-DUR,KLOR-CON) 20 MEQ tablet Take 40 mEq by mouth daily. 10/30/15  Yes Historical Provider, MD  pravastatin (PRAVACHOL) 20 MG tablet Take 20 mg by mouth daily.   Yes Historical Provider, MD  rivaroxaban (XARELTO) 20 MG TABS tablet Take 1 tablet (20 mg total) by mouth daily with supper. 03/11/15  Yes  Geradine Girt, DO  tamsulosin (FLOMAX) 0.4 MG CAPS capsule Take 1 capsule (0.4 mg total) by mouth daily. 03/11/15  Yes Geradine Girt, DO  venlafaxine XR (EFFEXOR XR) 37.5 MG 24 hr capsule Take 2 capsules (75 mg total) by mouth daily with breakfast. 2 pills in AM 03/11/15  Yes Jessica U Vann, DO   BP 102/49 mmHg  Pulse 70  Temp(Src) 102.3 F (39.1 C) (Rectal)  Resp 19  Wt 134 lb (60.782 kg)  SpO2 95% Physical Exam  Constitutional: He appears well-developed and well-nourished. He appears ill.  HENT:  Head: Normocephalic and atraumatic.  Eyes: Pupils are equal, round, and reactive to light.  Neck: Normal range of motion.  Cardiovascular: Normal rate and intact distal pulses.   Pulmonary/Chest: No respiratory distress.  Abdominal: Soft. Normal appearance. He exhibits no distension. There is no tenderness.  Musculoskeletal: Normal range of motion.  Neurological: He is unresponsive. No cranial nerve deficit.  Skin: Skin is warm and dry. No rash noted.  Nursing note and vitals reviewed.   ED Course  Procedures (including critical care time) Medications  piperacillin-tazobactam (ZOSYN) IVPB 3.375 g (not administered)  0.9 %  sodium chloride infusion ( Intravenous New Bag/Given 10/31/15 1302)  piperacillin-tazobactam (ZOSYN) IVPB 3.375 g (3.375 g Intravenous New Bag/Given 10/31/15 1411)    CRITICAL CARE Performed by: Leonard Schwartz L Total critical care time: 30 minutes Critical care time was exclusive of separately billable procedures and treating other patients. Critical care was necessary to treat or prevent imminent or life-threatening deterioration. Critical care was time spent personally by me on the following activities: development of treatment plan with patient and/or surrogate as well as nursing, discussions with consultants, evaluation of patient's response to treatment, examination of patient, obtaining history from patient or surrogate, ordering and performing treatments and  interventions, ordering and review of laboratory studies, ordering and review of radiographic studies, pulse oximetry and re-evaluation of patient's condition.   Labs Review Labs Reviewed  COMPREHENSIVE METABOLIC PANEL - Abnormal; Notable for the following:    Chloride 112 (*)    CO2 21 (*)    Calcium 7.3 (*)    Total Protein 5.1 (*)    Albumin 2.5 (*)    ALT 11 (*)    All other components within normal limits  CBC WITH DIFFERENTIAL/PLATELET - Abnormal; Notable for the following:    RBC 2.87 (*)    Hemoglobin 8.8 (*)    HCT 26.3 (*)    Monocytes Absolute 1.2 (*)    All other components within normal limits  BRAIN NATRIURETIC PEPTIDE - Abnormal; Notable for the following:  B Natriuretic Peptide 382.1 (*)    All other components within normal limits  CULTURE, BLOOD (ROUTINE X 2)  CULTURE, BLOOD (ROUTINE X 2)  URINE CULTURE  URINALYSIS, ROUTINE W REFLEX MICROSCOPIC (NOT AT [facility])  I-STAT CG4 LACTIC ACID, ED  I-STAT CG4 LACTIC ACID, ED    Imaging Review Dg Chest Port 1 View  10/31/2015  CLINICAL DATA:  Sepsis EXAM: PORTABLE CHEST - 1 VIEW COMPARISON:  04/14/2015 FINDINGS: Cardiac shadow is mildly prominent but stable. Aortic calcifications are again seen. Mild apical scarring is noted and stable. Mild chronic interstitial changes are seen. No focal infiltrate or sizable effusion is noted. IMPRESSION: Chronic changes without acute abnormality. Electronically Signed   By: Inez Catalina M.D.   On: 10/31/2015 13:29   I have personally reviewed and evaluated these images and lab results as part of my medical decision-making.   EKG Interpretation   Date/Time:  Friday October 31 2015 12:52:02 EST Ventricular Rate:  111 PR Interval:  160 QRS Duration: 97 QT Interval:  289 QTC Calculation: 393 R Axis:   -56 Text Interpretation:  A-V dual-paced complexes w/ some inhibition No  further analysis attempted due to paced rhythm No significant change since  last tracing Confirmed by  Naveyah Iacovelli  MD, Teona Vargus ([id number]) on 10/31/2015 2:46:02  PM     Code sepsis call.  Patient DO NOT RESUSCITATE as per accompanying documentation.  Patient admitted to hospitalist service.  Antibiotics given in the emergency department. MDM   Final diagnoses:  Fever, unspecified fever cause        Leonard Schwartz, MD 10/31/15 [phone]

## 2015-11-01 LAB — URINE CULTURE: Culture: NO GROWTH

## 2015-11-01 LAB — CBC
HCT: 33.1 % — ABNORMAL LOW (ref 39.0–52.0)
Hemoglobin: 10.7 g/dL — ABNORMAL LOW (ref 13.0–17.0)
MCH: 29.2 pg (ref 26.0–34.0)
MCHC: 32.3 g/dL (ref 30.0–36.0)
MCV: 90.4 fL (ref 78.0–100.0)
Platelets: 264 K/uL (ref 150–400)
RBC: 3.66 MIL/uL — ABNORMAL LOW (ref 4.22–5.81)
RDW: 14.8 % (ref 11.5–15.5)
WBC: 4.6 K/uL (ref 4.0–10.5)

## 2015-11-01 LAB — BASIC METABOLIC PANEL
Anion gap: 11 (ref 5–15)
BUN: 18 mg/dL (ref 6–20)
CHLORIDE: 109 mmol/L (ref 101–111)
CO2: 22 mmol/L (ref 22–32)
CREATININE: 0.91 mg/dL (ref 0.61–1.24)
Calcium: 8.9 mg/dL (ref 8.9–10.3)
GFR calc Af Amer: >60 mL/min (ref >60)
GFR calc non Af Amer: >60 mL/min (ref >60)
GLUCOSE: 152 mg/dL — AB (ref 65–99)
POTASSIUM: 4.4 mmol/L (ref 3.5–5.1)
Sodium: 142 mmol/L (ref 135–145)

## 2015-11-01 MED ORDER — LEVOTHYROXINE SODIUM 25 MCG PO TABS
25.0000 ug | ORAL_TABLET | Freq: Every day | ORAL | Status: DC
  Administered 2015-11-02 – 2015-11-04 (×3): 25 ug via ORAL
  Filled 2015-11-01 (×3): qty 1

## 2015-11-01 MED ORDER — RIVAROXABAN 20 MG PO TABS
20.0000 mg | ORAL_TABLET | Freq: Every day | ORAL | Status: DC
  Administered 2015-11-01 – 2015-11-03 (×3): 20 mg via ORAL
  Filled 2015-11-01 (×5): qty 1

## 2015-11-01 MED ORDER — ENSURE ENLIVE PO LIQD
237.0000 mL | Freq: Three times a day (TID) | ORAL | Status: DC
  Administered 2015-11-01 – 2015-11-04 (×4): 237 mL via ORAL

## 2015-11-01 MED ORDER — FLUTICASONE PROPIONATE 50 MCG/ACT NA SUSP
2.0000 | Freq: Every day | NASAL | Status: DC
  Administered 2015-11-01 – 2015-11-04 (×4): 2 via NASAL
  Filled 2015-11-01: qty 16

## 2015-11-01 MED ORDER — GUAIFENESIN 200 MG PO TABS
200.0000 mg | ORAL_TABLET | ORAL | Status: DC | PRN
  Filled 2015-11-01: qty 1

## 2015-11-01 MED ORDER — FERROUS SULFATE 325 (65 FE) MG PO TABS
325.0000 mg | ORAL_TABLET | Freq: Every day | ORAL | Status: DC
  Administered 2015-11-01 – 2015-11-04 (×4): 325 mg via ORAL
  Filled 2015-11-01 (×4): qty 1

## 2015-11-01 MED ORDER — TAMSULOSIN HCL 0.4 MG PO CAPS
0.4000 mg | ORAL_CAPSULE | Freq: Every day | ORAL | Status: DC
  Administered 2015-11-01 – 2015-11-04 (×4): 0.4 mg via ORAL
  Filled 2015-11-01 (×4): qty 1

## 2015-11-01 MED ORDER — MOMETASONE FURO-FORMOTEROL FUM 200-5 MCG/ACT IN AERO
2.0000 | INHALATION_SPRAY | Freq: Two times a day (BID) | RESPIRATORY_TRACT | Status: DC
  Administered 2015-11-01 – 2015-11-04 (×6): 2 via RESPIRATORY_TRACT
  Filled 2015-11-01 (×2): qty 8.8

## 2015-11-01 MED ORDER — VENLAFAXINE HCL ER 75 MG PO CP24
75.0000 mg | ORAL_CAPSULE | Freq: Every day | ORAL | Status: DC
  Administered 2015-11-02 – 2015-11-04 (×3): 75 mg via ORAL
  Filled 2015-11-01 (×6): qty 1

## 2015-11-01 MED ORDER — PRAVASTATIN SODIUM 20 MG PO TABS
20.0000 mg | ORAL_TABLET | Freq: Every day | ORAL | Status: DC
  Administered 2015-11-01 – 2015-11-03 (×3): 20 mg via ORAL
  Filled 2015-11-01 (×4): qty 1

## 2015-11-01 MED ORDER — ENSURE ENLIVE PO LIQD
237.0000 mL | Freq: Two times a day (BID) | ORAL | Status: DC
  Administered 2015-11-01: 237 mL via ORAL

## 2015-11-01 MED ORDER — ACETAMINOPHEN 325 MG PO TABS: 650.0000 mg | ORAL_TABLET | Freq: Four times a day (QID) | ORAL | Status: DC | PRN

## 2015-11-01 MED ORDER — SODIUM CHLORIDE 0.9 % IV SOLN
INTRAVENOUS | Status: AC
  Administered 2015-11-01: 14:00:00 via INTRAVENOUS

## 2015-11-01 MED ORDER — PANTOPRAZOLE SODIUM 40 MG PO TBEC
40.0000 mg | DELAYED_RELEASE_TABLET | Freq: Every day | ORAL | Status: DC
  Administered 2015-11-02 – 2015-11-04 (×3): 40 mg via ORAL
  Filled 2015-11-01 (×3): qty 1

## 2015-11-01 NOTE — Progress Notes (Signed)
Patient has voided on time since this morning current bladder scan showing 146 physician aware and has increased patients fluids.  Will continue to monitor.

## 2015-11-01 NOTE — Evaluation (Signed)
Clinical/Bedside Swallow Evaluation Patient Details  Name: Todd [name] MRN: [id number] Date of Birth: 03/09/1924  Today's Date: 11/01/2015 Time: SLP Start Time (ACUTE ONLY): 0956 SLP Stop Time (ACUTE ONLY): 1021 SLP Time Calculation (min) (ACUTE ONLY): 25 min  Past Medical History:  Past Medical History  Diagnosis Date  . Asthma   . Hypertension   . A-fib ([place])   . Hyperlipidemia   . Shortness of breath   . CHF (congestive heart failure) ([place]) 10/28/2011  . Forgetfulness   . PVC's (premature ventricular contractions)   . Mild aortic insufficiency   . Dementia     [name] 02/05/2015  . Failure to thrive in adult     /notes 02/05/2015  . COPD (chronic obstructive pulmonary disease) ([place])   . Hypothyroidism     [name] 02/05/2015  . Fall 02/04/2015    with altered mental status off of his baseline after he suffered a mechanical fall /notes 02/04/2015  . CAP (community acquired pneumonia)     [name] 02/05/2015  . Colon cancer [facility])    Past Surgical History:  Past Surgical History  Procedure Laterality Date  . Colon surgery      Partial hemecolectomy   . Central line insertion  09/08/2011        HPI:  Todd [name] is a 81 y.o. male has a past medical history significant for COPD, dementia, PNA (01/2015), Asthma, chronic A fib, HLD, diastolic CHF, brought from his SNF due to AMS and fever. Admitted for presumed sepsis due to aspiration PNA. Per notes, patient with episodes of vomiting, then became lethargic and febrile. Patient with h/o dysphagia requiring thickened liquids. Most recent MBS 03/10/2015 recommended dysphagia 3, thin liquids with emphasis on limiting bolus size for prevention of aspiration.    Assessment / Plan / Recommendation Clinical Impression  Swallow evaluation complete. Results similar to most recent MBS in which patient with mild oral phase deficits characterized by suspected decreased bolus containment resulting in a what appears to be a delay in swallow  initiation. Subtle cough noted post intake with thin liquids only during today's study. Noted that during MBS, results consistent between thin and nectar thick liquids with silent penetration, prevented with small single sips. Per daughter, patient consuming both thick and thin liquids at SNF without known overt difficulty. Given history and likely loss of functional reserve with acute illness, recommend initiation of conservative diet with potential repeat instrumental study in 2-3 days to establish baseline prior to returning to SNF. SLP will f/u.     Aspiration Risk  Moderate aspiration risk    Diet Recommendation Dysphagia 3 (Mech soft);Honey-thick liquid   Liquid Administration via: Cup;No straw Medication Administration: Crushed with puree Supervision: Patient able to self feed;Full supervision/cueing for compensatory strategies Compensations: Slow rate;Small sips/bites Postural Changes: Seated upright at 90 degrees    Other  Recommendations Oral Care Recommendations: Oral care BID Other Recommendations: Order thickener from pharmacy;Prohibited food (jello, ice cream, thin soups);Remove water pitcher   Follow up Recommendations  Skilled Nursing facility    Frequency and Duration min 2x/week  2 weeks       Prognosis Prognosis for Safe Diet Advancement: Fair Barriers to Reach Goals: Time post onset      Swallow Study   General HPI: Todd [name] is a 81 y.o. male has a past medical history significant for COPD, dementia, PNA (01/2015), Asthma, chronic A fib, HLD, diastolic CHF, brought from his SNF due to AMS and fever. Admitted for presumed  sepsis due to aspiration PNA. Per notes, patient with episodes of vomiting, then became lethargic and febrile. Patient with h/o dysphagia requiring thickened liquids. Most recent MBS 03/10/2015 recommended dysphagia 3, thin liquids with emphasis on limiting bolus size for prevention of aspiration.  Type of Study: Bedside Swallow  Evaluation Previous Swallow Assessment: see HPI Diet Prior to this Study: NPO Temperature Spikes Noted: Yes Respiratory Status: Room air History of Recent Intubation: No Behavior/Cognition: Alert;Cooperative;Pleasant mood Oral Cavity Assessment: Dried secretions (along uvula) Oral Care Completed by SLP: Recent completion by staff Oral Cavity - Dentition: Adequate natural dentition Vision: Functional for self-feeding Self-Feeding Abilities: Able to feed self Patient Positioning: Upright in bed Baseline Vocal Quality: Hoarse Volitional Cough: Strong Volitional Swallow: Able to elicit    Oral/Motor/Sensory Function Overall Oral Motor/Sensory Function: Within functional limits   Ice Chips Ice chips: Impaired Presentation: Spoon Pharyngeal Phase Impairments: Cough - Immediate   Thin Liquid Thin Liquid: Impaired Presentation: Cup;Self Fed Pharyngeal  Phase Impairments: Multiple swallows;Cough - Immediate;Cough - Delayed    Nectar Thick Nectar Thick Liquid: Not tested   Honey Thick Honey Thick Liquid: Within functional limits Presentation: Cup;Self fed;Spoon   Puree Puree: Not tested   Solid   GO    Solid: Within functional limits Presentation: [place], CCC-SLP [phone]  Eldonna Neuenfeldt Meryl 11/01/2015,10:27 AM

## 2015-11-01 NOTE — Progress Notes (Addendum)
Initial Nutrition Assessment  DOCUMENTATION CODES:  Non-severe (moderate) malnutrition in context of chronic illness  INTERVENTION:  Ensure Enlive po TID, thickened to HT, each supplement provides 350 kcal and 20 grams of protein  NUTRITION DIAGNOSIS:  Malnutrition related to chronic illness as evidenced by severe depletion of muscle mass, moderate depletion of body fat  GOAL:  Patient will meet greater than or equal to 90% of their needs  MONITOR:  PO intake, Supplement acceptance, Labs, Weight trends, I & O's, Diet advancement  REASON FOR ASSESSMENT:  Malnutrition Screening Tool  ASSESSMENT:  81 y/o male PMHx COPD, asmtha, a fib, CHF presents from SNF due to lethargy, episodes of vomiting, AMS and fever. In the ED he appears septic with fever, tachycardia, tachypnea. TRH asked for admission for presumed sepsis from aspiration pneumonia. MRSA found.   RD spoke with daughter on phone.  She reports that pt was recently, actually eating very well at the nursing facility. She reports that he would "clean his plate" at the meal she was present at and sometimes receives second portions. The facility had him on a diet consisting of softer foods with chopped up meatt. He took an off brand of ensure a couple times a day. Daughter reports speaking with facility dietitian a couple weeks ago and they decided then to double up on the Ensure due to wt loss.   S/p bedside swallow evaluation today.  SLP recommending Dys 3, honey-thick liquid diet. Daughter reports that pt has been on thickeners before, even outside the acute setting. She reports he loves Ensures. Will order TID.   She reports that recently his UBW has been 130. This would indicate the pt has gained wt recently.   Given conducted nfpe, good appetite and reported wt gain, would not meet criteria for severe mal.   NFPE: Moderate Upper body fat wasting and severe muscle wasting  Diet Order:  DIET DYS 3 Room service appropriate?: Yes;  Fluid consistency:: Honey Thick  Skin:  Scabs draining fluid, pale dry  Last BM:  12/30  Height:  Ht Readings from Last 1 Encounters:  10/31/15 5\' 7"  (1.702 m)   Weight:  Wt Readings from Last 1 Encounters:  10/31/15 138 lb 0.1 oz (62.6 kg)   Wt Readings from Last 10 Encounters:  10/31/15 138 lb 0.1 oz (62.6 kg)  09/12/15 138 lb (62.596 kg)  08/08/15 139 lb (63.05 kg)  06/25/15 137 lb (62.143 kg)  05/16/15 142 lb 9.6 oz (64.683 kg)  04/05/15 141 lb 8.6 oz (64.2 kg)  03/08/15 153 lb 14.1 oz (69.8 kg)  02/08/15 157 lb 3 oz (71.3 kg)  01/13/15 155 lb (70.308 kg)  09/10/14 166 lb 8 oz (75.524 kg)   Ideal Body Weight:  67.3 kg  BMI:  Body mass index is 21.61 kg/(m^2).  Estimated Nutritional Needs:  Kcal:  1500-1700 (24-26 kcal/kg bw) Protein:  75-94 g (1.3-1.5 g/kg bw) Fluid:  >/= 1.7 liters  EDUCATION NEEDS:  No education needs identified at this time  Burtis Junes RD, LDN Nutrition Pager: [phone] 11/01/2015 3:00 PM

## 2015-11-01 NOTE — Progress Notes (Signed)
PROGRESS NOTE  [name] [id number] DOB: [birth year] DOA: 10/31/2015 PCP: Odette Fraction, MD  HPI: Todd [name] is a 81 y.o. male has a past medical history significant for COPD, Asthma, chronic A fib, HLD, diastolic CHF, is being brought from his SNF due to AMS and fever  Subjective / 24 H Interval events - Appears improved this morning, he appears to weak to talk however he is smiling and follows commands  Assessment/Plan: Principal Problem:   Sepsis ([place]) Active Problems:   HLD (hyperlipidemia)   Chronic atrial fibrillation (HCC)   Acute respiratory failure with hypoxia (HCC)   Chronic diastolic CHF (congestive heart failure) (HCC)   Hypothyroidism   Failure to thrive in adult   HCAP (healthcare-associated pneumonia)   Acute encephalopathy   COPD (chronic obstructive pulmonary disease) ([place])   DNR (do not resuscitate)    Sepsis likely due to HCAP vs aspiration pneumonia  - Patient is improving this morning, continue vancomycin and Zosyn - As per speech evaluation, patient is more alert today and will start dysphagia 3 diet  Acute respiratory failure with hypoxia due to aspiration - respiratory status improving this morning, he is on room air   COPD  - wheezing on admission,  - continue IV steroids, antibiotics, nebulizers  Chronic diastolic heart failure  - patient appears euvolemic this morning  - Discontinue fluids   Chronic atrial fibrillation - patient's CHA2DS2-VASc Score for Stroke Risk is 3 - we'll resume his Xarelto  Hypothyroidism  - change Synthroid back to by mouth   Severe protein caloric malnutrition   Diet: DIET DYS 3 Room service appropriate?: Yes; Fluid consistency:: Honey Thick Fluids: none DVT Prophylaxis: Xarelto  Code Status: DNR Family Communication: no family bedside Disposition Plan: remain in SDU today  Barriers to discharge: IV therapies  Consultants:  None   Procedures:  None     Antibiotics Vancomycin 12/30 >> Zosyn 12/30 >>    Studies  Dg Chest Port 1 View  10/31/2015  CLINICAL DATA:  Sepsis EXAM: PORTABLE CHEST - 1 VIEW COMPARISON:  04/14/2015 FINDINGS: Cardiac shadow is mildly prominent but stable. Aortic calcifications are again seen. Mild apical scarring is noted and stable. Mild chronic interstitial changes are seen. No focal infiltrate or sizable effusion is noted. IMPRESSION: Chronic changes without acute abnormality. Electronically Signed   By: Inez Catalina M.D.   On: 10/31/2015 13:29    Objective  Filed Vitals:   11/01/15 0324 11/01/15 0600 11/01/15 0700 11/01/15 1116  BP: 136/74 122/69  123/63  Pulse: 60   59  Temp: 97.9 F (36.6 C)  98 F (36.7 C) 96 F (35.6 C)  TempSrc: Axillary  Oral Axillary  Resp: 16   19  Height:      Weight:      SpO2: 98%   97%    Intake/Output Summary (Last 24 hours) at 11/01/15 1257 Last data filed at 11/01/15 [id number]  Gross per 24 hour  Intake    150 ml  Output    500 ml  Net   -350 ml   Filed Weights   10/31/15 1304 10/31/15 1727  Weight: 60.782 kg (134 lb) 62.6 kg (138 lb 0.1 oz)    Exam:  GENERAL: NAD, pleasant, smiling   HEENT: no scleral icterus, PERRL  NECK: supple, no LAD  LUNGS: no wheezing  HEART: RRR without MRG  ABDOMEN: soft, non tender  EXTREMITIES: no clubbing / cyanosis  NEUROLOGIC: non focal   Data Reviewed: Basic Metabolic  Panel:  Recent Labs Lab 10/31/15 1350 11/01/15 0626  NA 141 142  K 3.7 4.4  CL 112* 109  CO2 21* 22  GLUCOSE 99 152*  BUN 12 18  CREATININE 0.72 0.91  CALCIUM 7.3* 8.9   Liver Function Tests:  Recent Labs Lab 10/31/15 1350  AST 15  ALT 11*  ALKPHOS 72  BILITOT 0.5  PROT 5.1*  ALBUMIN 2.5*   CBC:  Recent Labs Lab 10/31/15 1350 11/01/15 0626  WBC 6.4 4.6  NEUTROABS 3.9  --   HGB 8.8* 10.7*  HCT 26.3* 33.1*  MCV 91.6 90.4  PLT 213 264   BNP (last 3 results)  Recent Labs  02/05/15 0105 03/29/15 0422  10/31/15 1350  BNP 144.1* 327.5* 382.1*   CBG: No results for input(s): GLUCAP in the last 168 hours.  Recent Results (from the past 240 hour(s))  Urine culture     Status: None   Collection Time: 10/31/15  1:08 PM  Result Value Ref Range Status   Specimen Description URINE, CATHETERIZED  Final   Special Requests NONE  Final   Culture NO GROWTH 1 DAY  Final   Report Status 11/01/2015 FINAL  Final  Blood Culture (routine x 2)     Status: None (Preliminary result)   Collection Time: 10/31/15  1:50 PM  Result Value Ref Range Status   Specimen Description BLOOD LEFT FOREARM  Final   Special Requests BOTTLES DRAWN AEROBIC AND ANAEROBIC 5CC  Final   Culture NO GROWTH < 24 HOURS  Final   Report Status PENDING  Incomplete  Blood Culture (routine x 2)     Status: None (Preliminary result)   Collection Time: 10/31/15  2:05 PM  Result Value Ref Range Status   Specimen Description BLOOD RIGHT FOREARM  Final   Special Requests BOTTLES DRAWN AEROBIC AND ANAEROBIC 5CC  Final   Culture NO GROWTH < 24 HOURS  Final   Report Status PENDING  Incomplete  MRSA PCR Screening     Status: Abnormal   Collection Time: 10/31/15  5:42 PM  Result Value Ref Range Status   MRSA by PCR POSITIVE (A) NEGATIVE Final    Comment:        The GeneXpert MRSA Assay (FDA approved for NASAL specimens only), is one component of a comprehensive MRSA colonization surveillance program. It is not intended to diagnose MRSA infection nor to guide or monitor treatment for MRSA infections. RESULT CALLED TO, READ BACK BY AND VERIFIED WITH: ANTELLONE,J.,RN AT 2007 BY L.PITT 10/31/15      Scheduled Meds: . Chlorhexidine Gluconate Cloth  6 each Topical Q0600  . levothyroxine  12.5 mcg Intravenous Daily  . methylPREDNISolone (SOLU-MEDROL) injection  60 mg Intravenous Q12H  . mupirocin ointment  1 application Nasal BID  . piperacillin-tazobactam (ZOSYN)  IV  3.375 g Intravenous 3 times per day  . vancomycin  500 mg  Intravenous Q12H   Continuous Infusions:    Marzetta Board, MD [facility] Pager [phone]. If 7 PM - 7 AM, please contact night-coverage at [email or web address], password [facility] 11/01/2015, 12:57 PM  LOS: 1 day

## 2015-11-02 DIAGNOSIS — E440 Moderate protein-calorie malnutrition: Secondary | ICD-10-CM | POA: Insufficient documentation

## 2015-11-02 LAB — CBC
HEMATOCRIT: 30.3 % — AB (ref 39.0–52.0)
HEMOGLOBIN: 10.1 g/dL — AB (ref 13.0–17.0)
MCH: 30.2 pg (ref 26.0–34.0)
MCHC: 33.3 g/dL (ref 30.0–36.0)
MCV: 90.7 fL (ref 78.0–100.0)
Platelets: 241 10*3/uL (ref 150–400)
RBC: 3.34 MIL/uL — ABNORMAL LOW (ref 4.22–5.81)
RDW: 14.8 % (ref 11.5–15.5)
WBC: 7.8 10*3/uL (ref 4.0–10.5)

## 2015-11-02 LAB — BASIC METABOLIC PANEL
Anion gap: 9 (ref 5–15)
BUN: 28 mg/dL — ABNORMAL HIGH (ref 6–20)
CHLORIDE: 111 mmol/L (ref 101–111)
CO2: 23 mmol/L (ref 22–32)
CREATININE: 1.08 mg/dL (ref 0.61–1.24)
Calcium: 8.8 mg/dL — ABNORMAL LOW (ref 8.9–10.3)
GFR calc non Af Amer: 59 mL/min — ABNORMAL LOW (ref >60)
GLUCOSE: 157 mg/dL — AB (ref 65–99)
Potassium: 3.8 mmol/L (ref 3.5–5.1)
Sodium: 143 mmol/L (ref 135–145)

## 2015-11-02 MED ORDER — SODIUM CHLORIDE 0.9 % IV SOLN
INTRAVENOUS | Status: DC
  Administered 2015-11-03: 02:00:00 via INTRAVENOUS

## 2015-11-02 MED ORDER — PREDNISONE 20 MG PO TABS
20.0000 mg | ORAL_TABLET | Freq: Every day | ORAL | Status: DC
  Administered 2015-11-02 – 2015-11-04 (×3): 20 mg via ORAL
  Filled 2015-11-02 (×4): qty 1

## 2015-11-02 NOTE — Progress Notes (Signed)
Pt to transfer to 3E20 via bed with belongings. Will notify daugther [name] of new room number.

## 2015-11-02 NOTE — Progress Notes (Signed)
PROGRESS NOTE  Todd [name] [id number] DOB: 1924/10/22 DOA: 10/31/2015 PCP: Odette Fraction, MD  HPI: Todd [name] is a 82 y.o. male has a past medical history significant for COPD, Asthma, chronic A fib, HLD, diastolic CHF, is being brought from his SNF due to AMS and fever  Subjective / 24 H Interval events - alert, HOH, denies complaints, denies shortness of breath   Assessment/Plan: Principal Problem:   Sepsis ([place]) Active Problems:   HLD (hyperlipidemia)   Chronic atrial fibrillation (HCC)   Acute respiratory failure with hypoxia (HCC)   Chronic diastolic CHF (congestive heart failure) (HCC)   Hypothyroidism   Failure to thrive in adult   HCAP (healthcare-associated pneumonia)   Acute encephalopathy   COPD (chronic obstructive pulmonary disease) ([place])   DNR (do not resuscitate)   Malnutrition of moderate degree   Sepsis likely due to HCAP vs aspiration pneumonia  - Patient is improving this morning, continue Vancomycin and Zosyn - continues to improve, transfer to floor today  - PT to evaluate   Acute respiratory failure with hypoxia due to aspiration - respiratory status improved, he is on room air   COPD  - wheezing on admission,  - continue IV steroids, antibiotics, nebulizers  Chronic diastolic heart failure  - patient appears euvolemic this morning  - continue IVF for today   Chronic atrial fibrillation - patient's CHA2DS2-VASc Score for Stroke Risk is 3 - on Xarelto  Hypothyroidism  - changed Synthroid back to by mouth   Severe protein caloric malnutrition   Diet: DIET DYS 3 Room service appropriate?: Yes; Fluid consistency:: Honey Thick Fluids: none DVT Prophylaxis: Xarelto  Code Status: DNR Family Communication: no family bedside Disposition Plan: floor transfer  Barriers to discharge: IV therapies, PT evaluation pending   Consultants:  None   Procedures:  None    Antibiotics Vancomycin 12/30 >> Zosyn 12/30  >>    Studies  Dg Chest Port 1 View  10/31/2015  CLINICAL DATA:  Sepsis EXAM: PORTABLE CHEST - 1 VIEW COMPARISON:  04/14/2015 FINDINGS: Cardiac shadow is mildly prominent but stable. Aortic calcifications are again seen. Mild apical scarring is noted and stable. Mild chronic interstitial changes are seen. No focal infiltrate or sizable effusion is noted. IMPRESSION: Chronic changes without acute abnormality. Electronically Signed   By: Inez Catalina M.D.   On: 10/31/2015 13:29    Objective  Filed Vitals:   11/01/15 1520 11/01/15 1933 11/01/15 2334 11/02/15 0335  BP: 123/57 142/68 146/65 132/69  Pulse: 48 91 77 59  Temp: 97.4 F (36.3 C) 97.2 F (36.2 C) 98.1 F (36.7 C) 98 F (36.7 C)  TempSrc: Oral Oral Oral Oral  Resp: 19 28 28 25   Height:      Weight:      SpO2: 94% 96% 97% 98%    Intake/Output Summary (Last 24 hours) at 11/02/15 [id number] Last data filed at 11/02/15 0335  Gross per 24 hour  Intake 1153.75 ml  Output    500 ml  Net 653.75 ml   Filed Weights   10/31/15 1304 10/31/15 1727  Weight: 60.782 kg (134 lb) 62.6 kg (138 lb 0.1 oz)    Exam:  GENERAL: NAD, pleasant, smiling   HEENT: no scleral icterus, PERRL  NECK: supple, no LAD  LUNGS: no wheezing  HEART: RRR without MRG  ABDOMEN: soft, non tender  EXTREMITIES: no clubbing / cyanosis  NEUROLOGIC: non focal   Data Reviewed: Basic Metabolic Panel:  Recent Labs Lab 10/31/15 1350  11/01/15 0626 11/02/15 0438  NA 141 142 143  K 3.7 4.4 3.8  CL 112* 109 111  CO2 21* 22 23  GLUCOSE 99 152* 157*  BUN 12 18 28*  CREATININE 0.72 0.91 1.08  CALCIUM 7.3* 8.9 8.8*   Liver Function Tests:  Recent Labs Lab 10/31/15 1350  AST 15  ALT 11*  ALKPHOS 72  BILITOT 0.5  PROT 5.1*  ALBUMIN 2.5*   CBC:  Recent Labs Lab 10/31/15 1350 11/01/15 0626 11/02/15 0438  WBC 6.4 4.6 7.8  NEUTROABS 3.9  --   --   HGB 8.8* 10.7* 10.1*  HCT 26.3* 33.1* 30.3*  MCV 91.6 90.4 90.7  PLT 213 264 241    BNP (last 3 results)  Recent Labs  02/05/15 0105 03/29/15 0422 10/31/15 1350  BNP 144.1* 327.5* 382.1*   CBG: No results for input(s): GLUCAP in the last 168 hours.  Recent Results (from the past 240 hour(s))  Urine culture     Status: None   Collection Time: 10/31/15  1:08 PM  Result Value Ref Range Status   Specimen Description URINE, CATHETERIZED  Final   Special Requests NONE  Final   Culture NO GROWTH 1 DAY  Final   Report Status 11/01/2015 FINAL  Final  Blood Culture (routine x 2)     Status: None (Preliminary result)   Collection Time: 10/31/15  1:50 PM  Result Value Ref Range Status   Specimen Description BLOOD LEFT FOREARM  Final   Special Requests BOTTLES DRAWN AEROBIC AND ANAEROBIC 5CC  Final   Culture NO GROWTH < 24 HOURS  Final   Report Status PENDING  Incomplete  Blood Culture (routine x 2)     Status: None (Preliminary result)   Collection Time: 10/31/15  2:05 PM  Result Value Ref Range Status   Specimen Description BLOOD RIGHT FOREARM  Final   Special Requests BOTTLES DRAWN AEROBIC AND ANAEROBIC 5CC  Final   Culture NO GROWTH < 24 HOURS  Final   Report Status PENDING  Incomplete  MRSA PCR Screening     Status: Abnormal   Collection Time: 10/31/15  5:42 PM  Result Value Ref Range Status   MRSA by PCR POSITIVE (A) NEGATIVE Final    Comment:        The GeneXpert MRSA Assay (FDA approved for NASAL specimens only), is one component of a comprehensive MRSA colonization surveillance program. It is not intended to diagnose MRSA infection nor to guide or monitor treatment for MRSA infections. RESULT CALLED TO, READ BACK BY AND VERIFIED WITH: ANTELLONE,J.,RN AT 2007 BY L.PITT 10/31/15      Scheduled Meds: . Chlorhexidine Gluconate Cloth  6 each Topical Q0600  . feeding supplement (ENSURE ENLIVE)  237 mL Oral TID BM  . ferrous sulfate  325 mg Oral Daily  . fluticasone  2 spray Each Nare Daily  . levothyroxine  25 mcg Oral QAC breakfast  .  methylPREDNISolone (SOLU-MEDROL) injection  60 mg Intravenous Q12H  . mometasone-formoterol  2 puff Inhalation BID  . mupirocin ointment  1 application Nasal BID  . pantoprazole  40 mg Oral Daily  . piperacillin-tazobactam (ZOSYN)  IV  3.375 g Intravenous 3 times per day  . pravastatin  20 mg Oral q1800  . rivaroxaban  20 mg Oral Q supper  . tamsulosin  0.4 mg Oral Daily  . vancomycin  500 mg Intravenous Q12H  . venlafaxine XR  75 mg Oral Q breakfast   Continuous Infusions: . sodium  chloride       Marzetta Board, MD [facility] Pager [phone]. If 7 PM - 7 AM, please contact night-coverage at [email or web address], password [facility] 11/02/2015, 7:14 AM  LOS: 2 days

## 2015-11-02 NOTE — Progress Notes (Signed)
Attempted to call report, nurse unavailable at this time. Will call back later

## 2015-11-03 ENCOUNTER — Inpatient Hospital Stay (HOSPITAL_COMMUNITY): Payer: Medicare Other

## 2015-11-03 LAB — BASIC METABOLIC PANEL
ANION GAP: 10 (ref 5–15)
BUN: 27 mg/dL — ABNORMAL HIGH (ref 6–20)
CHLORIDE: 108 mmol/L (ref 101–111)
CO2: 26 mmol/L (ref 22–32)
CREATININE: 0.95 mg/dL (ref 0.61–1.24)
Calcium: 8.7 mg/dL — ABNORMAL LOW (ref 8.9–10.3)
GFR calc non Af Amer: >60 mL/min (ref >60)
Glucose, Bld: 118 mg/dL — ABNORMAL HIGH (ref 65–99)
POTASSIUM: 3.3 mmol/L — AB (ref 3.5–5.1)
SODIUM: 144 mmol/L (ref 135–145)

## 2015-11-03 LAB — CBC
HEMATOCRIT: 30.5 % — AB (ref 39.0–52.0)
HEMOGLOBIN: 10.1 g/dL — AB (ref 13.0–17.0)
MCH: 29.6 pg (ref 26.0–34.0)
MCHC: 33.1 g/dL (ref 30.0–36.0)
MCV: 89.4 fL (ref 78.0–100.0)
Platelets: 268 10*3/uL (ref 150–400)
RBC: 3.41 MIL/uL — AB (ref 4.22–5.81)
RDW: 14.9 % (ref 11.5–15.5)
WBC: 8.5 10*3/uL (ref 4.0–10.5)

## 2015-11-03 MED ORDER — POTASSIUM CHLORIDE CRYS ER 20 MEQ PO TBCR
40.0000 meq | EXTENDED_RELEASE_TABLET | Freq: Once | ORAL | Status: AC
  Administered 2015-11-03: 40 meq via ORAL
  Filled 2015-11-03: qty 2

## 2015-11-03 MED ORDER — RESOURCE THICKENUP CLEAR PO POWD
ORAL | Status: DC | PRN
  Filled 2015-11-03: qty 125

## 2015-11-03 NOTE — Evaluation (Signed)
Physical Therapy Evaluation Patient Details Name: Todd [name] MRN: [id number] DOB: 24-Jun-1924 Today's Date: 11/03/2015   History of Present Illness  82 yo male with onset of sepsis from aspiration PNA, with AMS, fever and was SNF resident prior to admission  Clinical Impression  Pt was able to get up to bedside with mod assist but needs 2 person assist to safely maneuver as he is confused and resisting.  Pt is not feeling well enough to try to stand and will attempt this later.  Will anticipate a return to SNF as extra help for his care is needed, not sure if he is a permanent resident.    Follow Up Recommendations SNF    Equipment Recommendations  None recommended by PT    Recommendations for Other Services Rehab consult     Precautions / Restrictions Precautions Precautions: Fall (telemetry) Restrictions Weight Bearing Restrictions: No      Mobility  Bed Mobility Overal bed mobility: Needs Assistance;+2 for physical assistance;+ 2 for safety/equipment Bed Mobility: Supine to Sit;Sit to Supine     Supine to sit: Mod assist;+2 for physical assistance;+2 for safety/equipment;HOB elevated Sit to supine: Mod assist;+2 for physical assistance;+2 for safety/equipment   General bed mobility comments: Pt actively resists being up to bedside and reports he doesn't feel well sitting there  Transfers Overall transfer level: Needs assistance Equipment used:  (Pt actively resisted and is dependent based on effort)             General transfer comment: Pt is unsafe to stand as he is pulling back and away from PT to attempt this, asks to get back tob ed  Ambulation/Gait             General Gait Details: unable to attempt  Stairs            Wheelchair Mobility    Modified Rankin (Stroke Patients Only)       Balance Overall balance assessment: Needs assistance Sitting-balance support: Feet supported (mod assist to cga to maintain sitting) Sitting  balance-Leahy Scale: Poor                                       Pertinent Vitals/Pain Pain Assessment: No/denies pain    Home Living Family/patient expects to be discharged to:: Skilled nursing facility                 Additional Comments: no family present; recently discharged to SNF from hospital    Prior Function Level of Independence: Needs assistance   Gait / Transfers Assistance Needed: pt cannot give PLOF  ADL's / Homemaking Assistance Needed: Was staying at SNF        Hand Dominance   Dominant Hand: Right    Extremity/Trunk Assessment   Upper Extremity Assessment: Overall WFL for tasks assessed           Lower Extremity Assessment: Generalized weakness (Pt is following instructions poorly )      Cervical / Trunk Assessment: Kyphotic  Communication   Communication: HOH  Cognition Arousal/Alertness: Awake/alert (impulsive) Behavior During Therapy: Impulsive;Restless Overall Cognitive Status: No family/caregiver present to determine baseline cognitive functioning       Memory: Decreased recall of precautions;Decreased short-term memory              General Comments General comments (skin integrity, edema, etc.): Pt is confused and is fearful apparently of trying to  get up to chair.  He will need to be seen with 2 person assist and is definitely still appropriate for SNF stay    Exercises        Assessment/Plan    PT Assessment Patient needs continued PT services  PT Diagnosis Difficulty walking;Generalized weakness   PT Problem List Decreased strength;Decreased range of motion;Decreased activity tolerance;Decreased balance;Decreased mobility;Decreased coordination;Decreased cognition;Decreased knowledge of use of DME;Decreased safety awareness;Decreased knowledge of precautions;Cardiopulmonary status limiting activity;Decreased skin integrity  PT Treatment Interventions DME instruction;Gait training;Functional mobility  training;Therapeutic activities;Therapeutic exercise;Balance training;Neuromuscular re-education;Patient/family education   PT Goals (Current goals can be found in the Care Plan section) Acute Rehab PT Goals Patient Stated Goal: none stated PT Goal Formulation: Patient unable to participate in goal setting Time For Goal Achievement: 11/17/15 Potential to Achieve Goals: Fair    Frequency Min 2X/week   Barriers to discharge Other (comment) (Will return to SNF)      Co-evaluation               End of Session Equipment Utilized During Treatment: Other (comment) (has catheter) Activity Tolerance: Patient tolerated treatment well;Patient limited by lethargy;Patient limited by fatigue Patient left: in bed;with call bell/phone within reach;with bed alarm set (at his request) Nurse Communication: Mobility status;Other (comment) (Pt had removed his IV when PT arrived)         Time: [id number] PT Time Calculation (min) (ACUTE ONLY): 29 min   Charges:   PT Evaluation $Initial PT Evaluation Tier I: 1 Procedure PT Treatments $Therapeutic Activity: 8-22 mins   PT G Codes:        Ramond Dial 11-05-2015, 12:25 PM   Mee Hives, PT MS Acute Rehab Dept. Number: [phone] and [place] [phone]

## 2015-11-03 NOTE — Progress Notes (Signed)
Discussed results of MBS via phone with patient's daughter. Daughter verbalized understanding and is in agreement to continue current diet with known risk of aspiration given that patient is improving clinically overall. SLP will continue to f/u for reinforcement of compensatory strategies and education prior to d/c. Recommend SLP f/u at SNF.   Avinger, [place] [phone]

## 2015-11-03 NOTE — Progress Notes (Signed)
MBSS complete. Full report located under chart review in imaging section.  Maitlyn Penza MA, CCC-SLP [phone]   

## 2015-11-03 NOTE — Progress Notes (Signed)
PROGRESS NOTE  AOUS MODEL [id number] DOB: 1924/06/25 DOA: 10/31/2015 PCP: Odette Fraction, MD  HPI: Todd [name] is a 82 y.o. male has a past medical history significant for COPD, Asthma, chronic A fib, HLD, diastolic CHF, is being brought from his SNF due to AMS and fever  Subjective / 24 H Interval events - alert, HOH, denies complaints, denies shortness of breath   Assessment/Plan: Principal Problem:   Sepsis ([place]) Active Problems:   HLD (hyperlipidemia)   Chronic atrial fibrillation (HCC)   Acute respiratory failure with hypoxia (HCC)   Chronic diastolic CHF (congestive heart failure) (HCC)   Hypothyroidism   Failure to thrive in adult   HCAP (healthcare-associated pneumonia)   Acute encephalopathy   COPD (chronic obstructive pulmonary disease) ([place])   DNR (do not resuscitate)   Malnutrition of moderate degree   Sepsis likely due to HCAP vs aspiration pneumonia  - Patient is improving this morning, started initially on Vancomycin and Zosyn, d/c vanc, continue Zosyn alone, narrow to Augmentin on d/c - improving - PT to evaluate, SNF possibly tomorrow  Acute respiratory failure with hypoxia due to aspiration - respiratory status improved, he is on room air   COPD  - wheezing on admission,  - prednisone, antibiotics, nebulizers  Chronic diastolic heart failure  - patient appears euvolemic this morning  - no further fluids  Chronic atrial fibrillation - patient's CHA2DS2-VASc Score for Stroke Risk is 3 - on Xarelto  Hypothyroidism  - changed Synthroid back to by mouth   Severe protein caloric malnutrition   Diet: DIET DYS 3 Room service appropriate?: Yes; Fluid consistency:: Honey Thick Fluids: none DVT Prophylaxis: Xarelto  Code Status: DNR Family Communication: no family bedside Disposition Plan: SNF 1-2 days Barriers to discharge: IV therapies, PT evaluation pending   Consultants:  None   Procedures:  None     Antibiotics Vancomycin 12/30 >> Zosyn 12/30 >>    Studies  Dg Swallowing Func-speech Pathology  11/03/2015  Objective Swallowing Evaluation:   MBS Patient Details Name: Todd [name] MRN: [id number] Date of Birth: 1924/03/12 Today's Date: 11/03/2015 Time: SLP Start Time (ACUTE ONLY): 0930-SLP Stop Time (ACUTE ONLY): 0942 SLP Time Calculation (min) (ACUTE ONLY): 12 min Past Medical History: @PMH @ Past Surgical History: Past Surgical History Procedure Laterality Date . Colon surgery     Partial hemecolectomy  . Central line insertion  09/08/2011     HPI: Todd [name] is a 82 y.o. male has a past medical history significant for COPD, dementia, PNA (01/2015), Asthma, chronic A fib, HLD, diastolic CHF, brought from his SNF due to AMS and fever. Admitted for presumed sepsis due to aspiration PNA. Per notes, patient with episodes of vomiting, then became lethargic and febrile. Patient with h/o dysphagia requiring thickened liquids. Most recent MBS 03/10/2015 recommended dysphagia 3, thin liquids with emphasis on limiting bolus size for prevention of aspiration.  No Data Recorded Assessment / Plan / Recommendation CHL IP CLINICAL IMPRESSIONS 11/03/2015 Therapy Diagnosis Severe pharyngeal phase dysphagia;Moderate pharyngeal phase dysphagia Clinical Impression Patient presents with a moderate-severe dysphagia, primarily pharyngeal in nature with both sensory and motor impairments. Patient with base of tongue, laryngeal, and pharyngeal weakness resulting with moderate-severe residuals with thicker consistencies and decreased airway protection both during and after the swallow. Unfortunately, thinner consistencies, which result in less residue, cause a delay in swallow initiation and penetration/aspiration before and during the swallow. No consistencies will fully prevent aspiration at this time. Cognitive status does not allow for  effective carryout of compensatory strategies to either prevent or clear  penetrates/aspirates. Will continue current diet at this time and discuss with POA.  Impact on safety and function Moderate aspiration risk;Severe aspiration risk   CHL IP TREATMENT RECOMMENDATION 11/03/2015 Treatment Recommendations Therapy as outlined in treatment plan below   Prognosis 11/03/2015 Prognosis for Safe Diet Advancement Guarded Barriers to Reach Goals Time post onset;Severity of deficits Barriers/Prognosis Comment -- CHL IP DIET RECOMMENDATION 11/03/2015 SLP Diet Recommendations Dysphagia 3 (Mech soft) solids;Honey thick liquids Liquid Administration via Spoon Medication Administration Crushed with puree Compensations Slow rate;Small sips/bites;Multiple dry swallows after each bite/sip Postural Changes Remain semi-upright after after feeds/meals (Comment)   CHL IP OTHER RECOMMENDATIONS 11/03/2015 Recommended Consults -- Oral Care Recommendations Oral care BID Other Recommendations Order thickener from pharmacy;Prohibited food (jello, ice cream, thin soups);Remove water pitcher   CHL IP FOLLOW UP RECOMMENDATIONS 11/03/2015 Follow up Recommendations Skilled Nursing facility   [facility] IP FREQUENCY AND DURATION 11/03/2015 Speech Therapy Frequency (ACUTE ONLY) min 2x/week Treatment Duration 2 weeks      CHL IP ORAL PHASE 11/03/2015 Oral Phase WFL Oral - Pudding Teaspoon -- Oral - Pudding Cup -- Oral - Honey Teaspoon -- Oral - Honey Cup -- Oral - Nectar Teaspoon -- Oral - Nectar Cup -- Oral - Nectar Straw -- Oral - Thin Teaspoon -- Oral - Thin Cup -- Oral - Thin Straw -- Oral - Puree -- Oral - Mech Soft -- Oral - Regular -- Oral - Multi-Consistency -- Oral - Pill -- Oral Phase - Comment --  CHL IP PHARYNGEAL PHASE 11/03/2015 Pharyngeal Phase Impaired Pharyngeal- Pudding Teaspoon -- Pharyngeal -- Pharyngeal- Pudding Cup -- Pharyngeal -- Pharyngeal- Honey Teaspoon Delayed swallow initiation-pyriform sinuses;Penetration/Aspiration during swallow;Pharyngeal residue - valleculae;Pharyngeal residue - pyriform;Reduced  airway/laryngeal closure;Reduced anterior laryngeal mobility;Reduced laryngeal elevation;Reduced tongue base retraction;Lateral channel residue Pharyngeal Material enters airway, CONTACTS cords and not ejected out Pharyngeal- Honey Cup -- Pharyngeal -- Pharyngeal- Nectar Teaspoon Delayed swallow initiation-pyriform sinuses;Penetration/Aspiration during swallow;Pharyngeal residue - valleculae;Pharyngeal residue - pyriform;Reduced airway/laryngeal closure;Reduced anterior laryngeal mobility;Reduced laryngeal elevation;Reduced tongue base retraction;Lateral channel residue Pharyngeal Material enters airway, CONTACTS cords and not ejected out Pharyngeal- Nectar Cup Delayed swallow initiation-pyriform sinuses;Penetration/Aspiration during swallow;Pharyngeal residue - valleculae;Pharyngeal residue - pyriform;Reduced airway/laryngeal closure;Reduced anterior laryngeal mobility;Reduced laryngeal elevation;Reduced tongue base retraction;Lateral channel residue Pharyngeal Material enters airway, CONTACTS cords and not ejected out;Material enters airway, passes BELOW cords without attempt by patient to eject out (silent aspiration) Pharyngeal- Nectar Straw -- Pharyngeal -- Pharyngeal- Thin Teaspoon Delayed swallow initiation-pyriform sinuses;Penetration/Aspiration during swallow;Pharyngeal residue - valleculae;Pharyngeal residue - pyriform;Reduced airway/laryngeal closure;Reduced anterior laryngeal mobility;Reduced laryngeal elevation;Reduced tongue base retraction;Lateral channel residue Pharyngeal Material enters airway, CONTACTS cords and not ejected out;Material enters airway, passes BELOW cords without attempt by patient to eject out (silent aspiration) Pharyngeal- Thin Cup -- Pharyngeal -- Pharyngeal- Thin Straw Delayed swallow initiation-pyriform sinuses;Penetration/Aspiration during swallow;Pharyngeal residue - valleculae;Pharyngeal residue - pyriform;Reduced airway/laryngeal closure;Reduced anterior laryngeal  mobility;Reduced laryngeal elevation;Reduced tongue base retraction;Lateral channel residue Pharyngeal Material enters airway, passes BELOW cords without attempt by patient to eject out (silent aspiration);Material enters airway, CONTACTS cords and not ejected out Pharyngeal- Puree Pharyngeal residue - valleculae;Pharyngeal residue - pyriform;Reduced airway/laryngeal closure;Reduced anterior laryngeal mobility;Reduced laryngeal elevation;Reduced tongue base retraction;Delayed swallow initiation-vallecula Pharyngeal Material does not enter airway Pharyngeal- Mechanical Soft Pharyngeal residue - valleculae;Pharyngeal residue - pyriform;Reduced airway/laryngeal closure;Reduced anterior laryngeal mobility;Reduced laryngeal elevation;Reduced tongue base retraction;Lateral channel residue;Delayed swallow initiation-vallecula Pharyngeal Material does not enter airway Pharyngeal- Regular -- Pharyngeal -- Pharyngeal- Multi-consistency -- Pharyngeal --  Pharyngeal- Pill -- Pharyngeal -- Pharyngeal Comment --  CHL IP CERVICAL ESOPHAGEAL PHASE 11/03/2015 Cervical Esophageal Phase WFL Pudding Teaspoon -- Pudding Cup -- Honey Teaspoon -- Honey Cup -- Nectar Teaspoon -- Nectar Cup -- Nectar Straw -- Thin Teaspoon -- Thin Cup -- Thin Straw -- Puree -- Mechanical Soft -- Regular -- Multi-consistency -- Pill -- Cervical Esophageal Comment -- Gabriel Rainwater MA, CCC-SLP [phone] [place] Leah Meryl 11/03/2015, 10:29 AM               Objective  Filed Vitals:   11/02/15 1942 11/02/15 2048 11/03/15 0006 11/03/15 0353  BP:  132/57 135/58 157/56  Pulse:  73 67 56  Temp:  98.2 F (36.8 C) 98.2 F (36.8 C) 98.4 F (36.9 C)  TempSrc:  Oral Oral Oral  Resp:  24 22 20   Height:      Weight:    63.8 kg (140 lb 10.5 oz)  SpO2: 96% 98% 97% 97%    Intake/Output Summary (Last 24 hours) at 11/03/15 1134 Last data filed at 11/03/15 0356  Gross per 24 hour  Intake    220 ml  Output    800 ml  Net   -580 ml   Filed Weights   10/31/15  1727 11/02/15 1014 11/03/15 0353  Weight: 62.6 kg (138 lb 0.1 oz) 63.8 kg (140 lb 10.5 oz) 63.8 kg (140 lb 10.5 oz)    Exam:  GENERAL: NAD, pleasant, smiling   HEENT: no scleral icterus, PERRL  NECK: supple, no LAD  LUNGS: no wheezing  HEART: RRR without MRG  ABDOMEN: soft, non tender  EXTREMITIES: no clubbing / cyanosis  NEUROLOGIC: non focal   Data Reviewed: Basic Metabolic Panel:  Recent Labs Lab 10/31/15 1350 11/01/15 0626 11/02/15 0438 11/03/15 0345  NA 141 142 143 144  K 3.7 4.4 3.8 3.3*  CL 112* 109 111 108  CO2 21* 22 23 26   GLUCOSE 99 152* 157* 118*  BUN 12 18 28* 27*  CREATININE 0.72 0.91 1.08 0.95  CALCIUM 7.3* 8.9 8.8* 8.7*   Liver Function Tests:  Recent Labs Lab 10/31/15 1350  AST 15  ALT 11*  ALKPHOS 72  BILITOT 0.5  PROT 5.1*  ALBUMIN 2.5*   CBC:  Recent Labs Lab 10/31/15 1350 11/01/15 0626 11/02/15 0438 11/03/15 0345  WBC 6.4 4.6 7.8 8.5  NEUTROABS 3.9  --   --   --   HGB 8.8* 10.7* 10.1* 10.1*  HCT 26.3* 33.1* 30.3* 30.5*  MCV 91.6 90.4 90.7 89.4  PLT 213 264 241 268   BNP (last 3 results)  Recent Labs  02/05/15 0105 03/29/15 0422 10/31/15 1350  BNP 144.1* 327.5* 382.1*   CBG: No results for input(s): GLUCAP in the last 168 hours.  Recent Results (from the past 240 hour(s))  Urine culture     Status: None   Collection Time: 10/31/15  1:08 PM  Result Value Ref Range Status   Specimen Description URINE, CATHETERIZED  Final   Special Requests NONE  Final   Culture NO GROWTH 1 DAY  Final   Report Status 11/01/2015 FINAL  Final  Blood Culture (routine x 2)     Status: None (Preliminary result)   Collection Time: 10/31/15  1:50 PM  Result Value Ref Range Status   Specimen Description BLOOD LEFT FOREARM  Final   Special Requests BOTTLES DRAWN AEROBIC AND ANAEROBIC 5CC  Final   Culture NO GROWTH 3 DAYS  Final   Report Status PENDING  Incomplete  Blood Culture (routine x 2)     Status: None (Preliminary result)    Collection Time: 10/31/15  2:05 PM  Result Value Ref Range Status   Specimen Description BLOOD RIGHT FOREARM  Final   Special Requests BOTTLES DRAWN AEROBIC AND ANAEROBIC 5CC  Final   Culture NO GROWTH 3 DAYS  Final   Report Status PENDING  Incomplete  MRSA PCR Screening     Status: Abnormal   Collection Time: 10/31/15  5:42 PM  Result Value Ref Range Status   MRSA by PCR POSITIVE (A) NEGATIVE Final    Comment:        The GeneXpert MRSA Assay (FDA approved for NASAL specimens only), is one component of a comprehensive MRSA colonization surveillance program. It is not intended to diagnose MRSA infection nor to guide or monitor treatment for MRSA infections. RESULT CALLED TO, READ BACK BY AND VERIFIED WITH: ANTELLONE,J.,RN AT 2007 BY L.PITT 10/31/15      Scheduled Meds: . Chlorhexidine Gluconate Cloth  6 each Topical Q0600  . feeding supplement (ENSURE ENLIVE)  237 mL Oral TID BM  . ferrous sulfate  325 mg Oral Daily  . fluticasone  2 spray Each Nare Daily  . levothyroxine  25 mcg Oral QAC breakfast  . mometasone-formoterol  2 puff Inhalation BID  . mupirocin ointment  1 application Nasal BID  . pantoprazole  40 mg Oral Daily  . piperacillin-tazobactam (ZOSYN)  IV  3.375 g Intravenous 3 times per day  . pravastatin  20 mg Oral q1800  . predniSONE  20 mg Oral Q breakfast  . rivaroxaban  20 mg Oral Q supper  . tamsulosin  0.4 mg Oral Daily  . vancomycin  500 mg Intravenous Q12H  . venlafaxine XR  75 mg Oral Q breakfast   Continuous Infusions:     Marzetta Board, MD [facility] Pager [phone]. If 7 PM - 7 AM, please contact night-coverage at [email or web address], password [facility] 11/03/2015, 11:34 AM  LOS: 3 days

## 2015-11-03 NOTE — NC FL2 (Signed)
[place] LEVEL OF CARE SCREENING TOOL     IDENTIFICATION  Patient Name: Todd [name] Birthdate: 1924/04/30 Sex: male Admission Date (Current Location): 10/31/2015  [facility] and [place] Number:  Herbalist and Address:  [facility], [place] [address], [place], [place] [address]      Provider Number: [id number]  Attending Physician Name and Address:  Caren Griffins, MD  Relative Name and Phone Number:       Current Level of Care: Hospital Recommended Level of Care: [place] Prior Approval Number:    Date Approved/Denied:   PASRR Number: [id number]  Discharge Plan: SNF    Current Diagnoses: Patient Active Problem List   Diagnosis Date Noted  . Malnutrition of moderate degree 11/02/2015  . Febrile illness, acute 10/31/2015  . Inflammatory arthritis ([place]) 06/14/2015  . Blistering rash 05/11/2015  . Conjunctivitis 05/11/2015  . Compression fracture of T12 vertebra ([place]) 04/07/2015  . Palliative care encounter 04/02/2015  . Dyspnea 04/02/2015  . Dysphagia 04/02/2015  . DNR (do not resuscitate) 04/02/2015  . COPD (chronic obstructive pulmonary disease) ([place]) 03/29/2015  . NSVT (nonsustained ventricular tachycardia) ([place]) 03/29/2015  . COPD exacerbation ([place]) 03/13/2015  . Anemia, iron deficiency 03/13/2015  . HCAP (healthcare-associated pneumonia) 03/08/2015  . Acute encephalopathy 03/08/2015  . Protein-calorie malnutrition, severe ([place]) 02/06/2015  . Weakness 02/05/2015  . CAP (community acquired pneumonia) 02/05/2015  . Failure to thrive in adult 02/05/2015  . Fall 02/05/2015  . Pancreatitis, gallstone   . Cholecystitis, acute   . Elevated LFTs   . Preoperative cardiovascular examination   . Acute pancreatitis   . Pancreatitis 09/05/2014  . Hypothyroidism 09/05/2014  . Gallstone pancreatitis 11/23/2013  . Chronic diastolic CHF (congestive heart failure) ([place]) 10/28/2011  . Pneumonia 09/06/2011   . Sepsis ([place]) 09/06/2011  . Chest pain 09/06/2011  . Chronic atrial fibrillation ([place]) 09/06/2011  . Acute respiratory failure with hypoxia ([place]) 09/06/2011  . PNEUMONIA, ORGANISM UNSPECIFIED 03/16/2010  . EDEMA 06/17/2008  . HLD (hyperlipidemia) 01/24/2008  . ALLERGIC RHINITIS 01/24/2008  . Asthma 01/24/2008  . BRONCHIECTASIS 01/24/2008  . Nonspecific (abnormal) findings on radiological and other examination of body structure 01/24/2008  . COLON CANCER 01/24/2008  . CHEST XRAY, ABNORMAL 01/24/2008    Orientation RESPIRATION BLADDER Height & Weight    Self  Normal Incontinent 6' (182.9 cm) 140 lbs.  BEHAVIORAL SYMPTOMS/MOOD NEUROLOGICAL BOWEL NUTRITION STATUS  Other (Comment) (n/a)  (n/a) Continent Diet (Please see discharge summary.)  AMBULATORY STATUS COMMUNICATION OF NEEDS Skin    (Did not ambulate with PT.) Verbally Skin abrasions (Elbow and toe)                       Personal Care Assistance Level of Assistance  Bathing, Feeding, Dressing Bathing Assistance: Maximum assistance Feeding assistance: Maximum assistance Dressing Assistance: Maximum assistance     Functional Limitations Info   (n/a)          SPECIAL CARE FACTORS FREQUENCY  PT (By licensed PT), OT (By licensed OT)     PT Frequency: 5 OT Frequency: 5            Contractures      Additional Factors Info  Code Status, Allergies, Isolation Precautions Code Status Info: DNR Allergies Info: No known allergies     Isolation Precautions Info: MRSA     Current Medications (11/03/2015):  This is the current hospital active medication list Current Facility-Administered Medications  Medication Dose Route Frequency Provider Last Rate Last Dose  . acetaminophen (TYLENOL) tablet 650 mg  650 mg Oral Q6H PRN Costin Karlyne Greenspan, MD      . Chlorhexidine Gluconate Cloth 2 % PADS 6 each  6 each Topical Q0600 Caren Griffins, MD   6 each at 11/03/15 0600  . feeding supplement (ENSURE ENLIVE) (ENSURE  ENLIVE) liquid 237 mL  237 mL Oral TID BM Oswaldo Milian, RD   237 mL at 11/02/15 2147  . ferrous sulfate tablet 325 mg  325 mg Oral Daily Caren Griffins, MD   325 mg at 11/03/15 1031  . fluticasone (FLONASE) 50 MCG/ACT nasal spray 2 spray  2 spray Each Nare Daily Costin Karlyne Greenspan, MD   2 spray at 11/03/15 1030  . guaiFENesin tablet 200 mg  200 mg Oral Q4H PRN Costin Karlyne Greenspan, MD      . levalbuterol Penne Lash) nebulizer solution 0.63 mg  0.63 mg Nebulization Q4H PRN Costin Karlyne Greenspan, MD      . levothyroxine (SYNTHROID, LEVOTHROID) tablet 25 mcg  25 mcg Oral QAC breakfast Caren Griffins, MD   25 mcg at 11/03/15 [phone]  . mometasone-formoterol (DULERA) 200-5 MCG/ACT inhaler 2 puff  2 puff Inhalation BID Caren Griffins, MD   2 puff at 11/03/15 1143  . mupirocin ointment (BACTROBAN) 2 % 1 application  1 application Nasal BID Caren Griffins, MD   1 application at [id number]  . pantoprazole (PROTONIX) EC tablet 40 mg  40 mg Oral Daily Costin Karlyne Greenspan, MD   40 mg at 11/03/15 1031  . piperacillin-tazobactam (ZOSYN) IVPB 3.375 g  3.375 g Intravenous 3 times per day Ricka Burdock, RPH   3.375 g at 11/03/15 1316  . pravastatin (PRAVACHOL) tablet 20 mg  20 mg Oral q1800 Caren Griffins, MD   20 mg at 11/02/15 1745  . predniSONE (DELTASONE) tablet 20 mg  20 mg Oral Q breakfast Caren Griffins, MD   20 mg at 11/03/15 0814  . rivaroxaban (XARELTO) tablet 20 mg  20 mg Oral Q supper Caren Griffins, MD   20 mg at 11/02/15 1745  . tamsulosin (FLOMAX) capsule 0.4 mg  0.4 mg Oral Daily Costin Karlyne Greenspan, MD   0.4 mg at 11/03/15 1031  . venlafaxine XR (EFFEXOR-XR) 24 hr capsule 75 mg  75 mg Oral Q breakfast Caren Griffins, MD   75 mg at 11/03/15 [id number]     Discharge Medications: Please see discharge summary for a list of discharge medications.  Relevant Imaging Results:  Relevant Lab Results:   Additional Information SSN: [id number]  Luna Kitchens [phone]

## 2015-11-03 NOTE — Progress Notes (Signed)
Fl2 completed and placed on chart for MD's signature.  82 year old male - resident of [place].  Full SW assessment to be completed as soon as patient and family can be interviewed.  Lorie Phenix. Pauline Good, [place]

## 2015-11-03 NOTE — Progress Notes (Signed)
Pharmacy Antibiotic Follow-up Note  Todd [name] is a 82 y.o. year-old male admitted on 10/31/2015.  The patient is currently on day 4 of Vancomycin and Zosyn  for rule out sepsis.  Assessment/Plan: Continue Zosyn 3.375 grams iv Q 8 hours Continue Vancomycin at 500 mg iv Q 12 hours -- consider stopping?    Temp (24hrs), Avg:98.1 F (36.7 C), Min:97.8 F (36.6 C), Max:98.4 F (36.9 C)   Recent Labs Lab 10/31/15 1350 11/01/15 0626 11/02/15 0438 11/03/15 0345  WBC 6.4 4.6 7.8 8.5    Recent Labs Lab 10/31/15 1350 11/01/15 0626 11/02/15 0438 11/03/15 0345  CREATININE 0.72 0.91 1.08 0.95   Estimated Creatinine Clearance: 47.6 mL/min (by C-G formula based on Cr of 0.95).    No Known Allergies  Zosyn 12/30>> Vanc 12/30>>  12/30 BCx: NGTD 12/30 UCx: NGF MRSA screen positive   Thank you. Anette Guarneri, PharmD [phone]   11/03/2015 9:52 AM

## 2015-11-04 MED ORDER — AMOXICILLIN-POT CLAVULANATE 875-125 MG PO TABS: 1.0000 | ORAL_TABLET | Freq: Two times a day (BID) | ORAL | Status: DC

## 2015-11-04 NOTE — Progress Notes (Signed)
Patient will discharge to [facility] Anticipated discharge date:11/04/15 Family notified: pt dtr Transportation by [facility]AR- called at 12pm  CSW signing off.  Domenica Reamer, [place] Social Worker [phone]

## 2015-11-04 NOTE — Progress Notes (Signed)
Pt has orders to be discharged back to [facility]. Telemetry box removed. IV removed and site in good condition. Pt stable and waiting for transportation.  Report attempted to [facility] RN at 1240. RN unavailable to take report, number given to call back.    Maurene Capes RN

## 2015-11-04 NOTE — Discharge Summary (Addendum)
Physician Discharge Summary  GOULD SNIDE [id number] DOB: May 14, 1924 DOA: 10/31/2015  PCP: Odette Fraction, MD  Admit date: 10/31/2015 Discharge date: 11/04/2015  Time spent: > 30 minutes  Recommendations for Outpatient Follow-up:  1. Follow up with PCP in 1-2 weeks   2. Continue Augmentin for 5 additional days 3. Ongoing supervision for swallowing precautions to prevent aspiration, dysphagia 3 diet, honey thick consistency.   Discharge Diagnoses:  Principal Problem:   Sepsis ([place]) Active Problems:   HLD (hyperlipidemia)   Chronic atrial fibrillation (HCC)   Acute respiratory failure with hypoxia (HCC)   Chronic diastolic CHF (congestive heart failure) (HCC)   Hypothyroidism   Failure to thrive in adult   HCAP (healthcare-associated pneumonia)   Acute encephalopathy   COPD (chronic obstructive pulmonary disease) ([place])   DNR (do not resuscitate)   Malnutrition of moderate degree  Discharge Condition: stable  Diet recommendation: regular  Filed Weights   11/02/15 1014 11/03/15 0353 11/04/15 0431  Weight: 63.8 kg (140 lb 10.5 oz) 63.8 kg (140 lb 10.5 oz) 63.1 kg (139 lb 1.8 oz)    History of present illness:  See H&P, Labs, Consult and Test reports for all details in brief, patient is a Todd [name] is a 82 y.o. male has a past medical history significant for COPD, Asthma, chronic A fib, HLD, diastolic CHF, is being brought from his SNF due to AMS and fever found to have aspiration PNA.  Hospital Course:  Sepsis likely due to HCAP vs aspiration pneumonia - Patient is improving this morning, started initially on Vancomycin and Zosyn, d/c vanc, continue Zosyn alone, narrowed to Augmentin on d/c and continue for 5 additional days.  Acute respiratory failure with hypoxia due to aspiration - respiratory status improved, he is on room air  COPD - wheezing on admission, short course of prednisone, antibiotics Chronic diastolic heart failure - patient appears  euvolemic this morning Chronic atrial fibrillation - patient's CHA2DS2-VASc Score for Stroke Risk is 3, on Xarelto Hypothyroidism - continue Synthroid Severe protein caloric malnutrition  Procedures:  None    Consultations:  None   Discharge Exam: Filed Vitals:   11/03/15 1317 11/03/15 2049 11/03/15 2146 11/04/15 0431  BP: 167/85  142/74 122/77  Pulse: 93  77 77  Temp: 97.3 F (36.3 C)  98.4 F (36.9 C) 98.2 F (36.8 C)  TempSrc: Oral  Oral Oral  Resp:   18 20  Height:      Weight:    63.1 kg (139 lb 1.8 oz)  SpO2: 98% 98% 98% 97%    General: NAD Cardiovascular: RRR Respiratory: CTA biL  Discharge Instructions Activity:  As tolerated   Get Medicines reviewed and adjusted: Please take all your medications with you for your next visit with your Primary MD  Please request your Primary MD to go over all hospital tests and procedure/radiological results at the follow up, please ask your Primary MD to get all Hospital records sent to his/her office.  If you experience worsening of your admission symptoms, develop shortness of breath, life threatening emergency, suicidal or homicidal thoughts you must seek medical attention immediately by calling 911 or calling your MD immediately if symptoms less severe.  You must read complete instructions/literature along with all the possible adverse reactions/side effects for all the Medicines you take and that have been prescribed to you. Take any new Medicines after you have completely understood and accpet all the possible adverse reactions/side effects.   Do not drive when taking  Pain medications.   Do not take more than prescribed Pain, Sleep and Anxiety Medications  Special Instructions: If you have smoked or chewed Tobacco in the last 2 yrs please stop smoking, stop any regular Alcohol and or any Recreational drug use.  Wear Seat belts while driving.  Please note  You were cared for by a hospitalist during your  hospital stay. Once you are discharged, your primary care physician will handle any further medical issues. Please note that NO REFILLS for any discharge medications will be authorized once you are discharged, as it is imperative that you return to your primary care physician (or establish a relationship with a primary care physician if you do not have one) for your aftercare needs so that they can reassess your need for medications and monitor your lab values.    Medication List    TAKE these medications        acetaminophen 325 MG tablet  Commonly known as:  TYLENOL  Take 650 mg by mouth every 6 (six) hours as needed for mild pain.     ADVAIR DISKUS 500-50 MCG/DOSE Aepb  Generic drug:  Fluticasone-Salmeterol  INHALE 1 PUFF TWICE A DAY     amoxicillin-clavulanate 875-125 MG tablet  Commonly known as:  AUGMENTIN  Take 1 tablet by mouth 2 (two) times daily.     ARTIFICIAL TEAR SOLUTION OP  Apply to eye. Instill 2 drops into both eyes four times daily as needed     feeding supplement (ENSURE ENLIVE) Liqd  Take 237 mLs by mouth 2 (two) times daily between meals.     ferrous sulfate 325 (65 FE) MG tablet  TAKE 1 TABLET BY MOUTH EVERY DAY     fluticasone 50 MCG/ACT nasal spray  Commonly known as:  FLONASE  Place 2 sprays into both nostrils daily.     guaiFENesin 200 MG tablet  Take 200 mg by mouth every 4 (four) hours as needed for cough or to loosen phlegm.     ipratropium-albuterol 0.5-2.5 (3) MG/3ML Soln  Commonly known as:  DUONEB  Take 3 mLs by nebulization. Every 4-6 hours as needed for SOB/wheezing     levalbuterol 0.63 MG/3ML nebulizer solution  Commonly known as:  XOPENEX  Take 3 mLs (0.63 mg total) by nebulization every 4 (four) hours as needed for wheezing or shortness of breath.     levothyroxine 25 MCG tablet  Commonly known as:  SYNTHROID, LEVOTHROID  TAKE 1 TABLET BY MOUTH EVERY DAY     losartan 50 MG tablet  Commonly known as:  COZAAR  Take 50 mg by mouth  daily.     omeprazole 40 MG capsule  Commonly known as:  PRILOSEC  Take 40 mg by mouth daily.     potassium chloride SA 20 MEQ tablet  Commonly known as:  K-DUR,KLOR-CON  Take 40 mEq by mouth daily.     pravastatin 20 MG tablet  Commonly known as:  PRAVACHOL  Take 20 mg by mouth daily.     rivaroxaban 20 MG Tabs tablet  Commonly known as:  XARELTO  Take 1 tablet (20 mg total) by mouth daily with supper.     tamsulosin 0.4 MG Caps capsule  Commonly known as:  FLOMAX  Take 1 capsule (0.4 mg total) by mouth daily.     venlafaxine XR 37.5 MG 24 hr capsule  Commonly known as:  EFFEXOR XR  Take 2 capsules (75 mg total) by mouth daily with breakfast. 2 pills in AM  The results of significant diagnostics from this hospitalization (including imaging, microbiology, ancillary and laboratory) are listed below for reference.    Significant Diagnostic Studies: Dg Chest Port 1 View  10/31/2015  CLINICAL DATA:  Sepsis EXAM: PORTABLE CHEST - 1 VIEW COMPARISON:  04/14/2015 FINDINGS: Cardiac shadow is mildly prominent but stable. Aortic calcifications are again seen. Mild apical scarring is noted and stable. Mild chronic interstitial changes are seen. No focal infiltrate or sizable effusion is noted. IMPRESSION: Chronic changes without acute abnormality. Electronically Signed   By: Inez Catalina M.D.   On: 10/31/2015 13:29   Dg Swallowing Func-speech Pathology  11/03/2015  Objective Swallowing Evaluation:   MBS Patient Details Name: Todd [name] MRN: [id number] Date of Birth: 1924/05/11 Today's Date: 11/03/2015 Time: SLP Start Time (ACUTE ONLY): 0930-SLP Stop Time (ACUTE ONLY): 0942 SLP Time Calculation (min) (ACUTE ONLY): 12 min Past Medical History: @PMH @ Past Surgical History: Past Surgical History Procedure Laterality Date . Colon surgery     Partial hemecolectomy  . Central line insertion  09/08/2011     HPI: Todd [name] is a 82 y.o. male has a past medical history significant for  COPD, dementia, PNA (01/2015), Asthma, chronic A fib, HLD, diastolic CHF, brought from his SNF due to AMS and fever. Admitted for presumed sepsis due to aspiration PNA. Per notes, patient with episodes of vomiting, then became lethargic and febrile. Patient with h/o dysphagia requiring thickened liquids. Most recent MBS 03/10/2015 recommended dysphagia 3, thin liquids with emphasis on limiting bolus size for prevention of aspiration.  No Data Recorded Assessment / Plan / Recommendation CHL IP CLINICAL IMPRESSIONS 11/03/2015 Therapy Diagnosis Severe pharyngeal phase dysphagia;Moderate pharyngeal phase dysphagia Clinical Impression Patient presents with a moderate-severe dysphagia, primarily pharyngeal in nature with both sensory and motor impairments. Patient with base of tongue, laryngeal, and pharyngeal weakness resulting with moderate-severe residuals with thicker consistencies and decreased airway protection both during and after the swallow. Unfortunately, thinner consistencies, which result in less residue, cause a delay in swallow initiation and penetration/aspiration before and during the swallow. No consistencies will fully prevent aspiration at this time. Cognitive status does not allow for effective carryout of compensatory strategies to either prevent or clear penetrates/aspirates. Will continue current diet at this time and discuss with POA.  Impact on safety and function Moderate aspiration risk;Severe aspiration risk   CHL IP TREATMENT RECOMMENDATION 11/03/2015 Treatment Recommendations Therapy as outlined in treatment plan below   Prognosis 11/03/2015 Prognosis for Safe Diet Advancement Guarded Barriers to Reach Goals Time post onset;Severity of deficits Barriers/Prognosis Comment -- CHL IP DIET RECOMMENDATION 11/03/2015 SLP Diet Recommendations Dysphagia 3 (Mech soft) solids;Honey thick liquids Liquid Administration via Spoon Medication Administration Crushed with puree Compensations Slow rate;Small  sips/bites;Multiple dry swallows after each bite/sip Postural Changes Remain semi-upright after after feeds/meals (Comment)   CHL IP OTHER RECOMMENDATIONS 11/03/2015 Recommended Consults -- Oral Care Recommendations Oral care BID Other Recommendations Order thickener from pharmacy;Prohibited food (jello, ice cream, thin soups);Remove water pitcher   CHL IP FOLLOW UP RECOMMENDATIONS 11/03/2015 Follow up Recommendations Skilled Nursing facility   [facility] IP FREQUENCY AND DURATION 11/03/2015 Speech Therapy Frequency (ACUTE ONLY) min 2x/week Treatment Duration 2 weeks      CHL IP ORAL PHASE 11/03/2015 Oral Phase WFL Oral - Pudding Teaspoon -- Oral - Pudding Cup -- Oral - Honey Teaspoon -- Oral - Honey Cup -- Oral - Nectar Teaspoon -- Oral - Nectar Cup -- Oral - Nectar Straw -- Oral - Thin Teaspoon -- Oral -  Thin Cup -- Oral - Thin Straw -- Oral - Puree -- Oral - Mech Soft -- Oral - Regular -- Oral - Multi-Consistency -- Oral - Pill -- Oral Phase - Comment --  CHL IP PHARYNGEAL PHASE 11/03/2015 Pharyngeal Phase Impaired Pharyngeal- Pudding Teaspoon -- Pharyngeal -- Pharyngeal- Pudding Cup -- Pharyngeal -- Pharyngeal- Honey Teaspoon Delayed swallow initiation-pyriform sinuses;Penetration/Aspiration during swallow;Pharyngeal residue - valleculae;Pharyngeal residue - pyriform;Reduced airway/laryngeal closure;Reduced anterior laryngeal mobility;Reduced laryngeal elevation;Reduced tongue base retraction;Lateral channel residue Pharyngeal Material enters airway, CONTACTS cords and not ejected out Pharyngeal- Honey Cup -- Pharyngeal -- Pharyngeal- Nectar Teaspoon Delayed swallow initiation-pyriform sinuses;Penetration/Aspiration during swallow;Pharyngeal residue - valleculae;Pharyngeal residue - pyriform;Reduced airway/laryngeal closure;Reduced anterior laryngeal mobility;Reduced laryngeal elevation;Reduced tongue base retraction;Lateral channel residue Pharyngeal Material enters airway, CONTACTS cords and not ejected out Pharyngeal- Nectar  Cup Delayed swallow initiation-pyriform sinuses;Penetration/Aspiration during swallow;Pharyngeal residue - valleculae;Pharyngeal residue - pyriform;Reduced airway/laryngeal closure;Reduced anterior laryngeal mobility;Reduced laryngeal elevation;Reduced tongue base retraction;Lateral channel residue Pharyngeal Material enters airway, CONTACTS cords and not ejected out;Material enters airway, passes BELOW cords without attempt by patient to eject out (silent aspiration) Pharyngeal- Nectar Straw -- Pharyngeal -- Pharyngeal- Thin Teaspoon Delayed swallow initiation-pyriform sinuses;Penetration/Aspiration during swallow;Pharyngeal residue - valleculae;Pharyngeal residue - pyriform;Reduced airway/laryngeal closure;Reduced anterior laryngeal mobility;Reduced laryngeal elevation;Reduced tongue base retraction;Lateral channel residue Pharyngeal Material enters airway, CONTACTS cords and not ejected out;Material enters airway, passes BELOW cords without attempt by patient to eject out (silent aspiration) Pharyngeal- Thin Cup -- Pharyngeal -- Pharyngeal- Thin Straw Delayed swallow initiation-pyriform sinuses;Penetration/Aspiration during swallow;Pharyngeal residue - valleculae;Pharyngeal residue - pyriform;Reduced airway/laryngeal closure;Reduced anterior laryngeal mobility;Reduced laryngeal elevation;Reduced tongue base retraction;Lateral channel residue Pharyngeal Material enters airway, passes BELOW cords without attempt by patient to eject out (silent aspiration);Material enters airway, CONTACTS cords and not ejected out Pharyngeal- Puree Pharyngeal residue - valleculae;Pharyngeal residue - pyriform;Reduced airway/laryngeal closure;Reduced anterior laryngeal mobility;Reduced laryngeal elevation;Reduced tongue base retraction;Delayed swallow initiation-vallecula Pharyngeal Material does not enter airway Pharyngeal- Mechanical Soft Pharyngeal residue - valleculae;Pharyngeal residue - pyriform;Reduced airway/laryngeal  closure;Reduced anterior laryngeal mobility;Reduced laryngeal elevation;Reduced tongue base retraction;Lateral channel residue;Delayed swallow initiation-vallecula Pharyngeal Material does not enter airway Pharyngeal- Regular -- Pharyngeal -- Pharyngeal- Multi-consistency -- Pharyngeal -- Pharyngeal- Pill -- Pharyngeal -- Pharyngeal Comment --  CHL IP CERVICAL ESOPHAGEAL PHASE 11/03/2015 Cervical Esophageal Phase WFL Pudding Teaspoon -- Pudding Cup -- Honey Teaspoon -- Honey Cup -- Nectar Teaspoon -- Nectar Cup -- Nectar Straw -- Thin Teaspoon -- Thin Cup -- Thin Straw -- Puree -- Mechanical Soft -- Regular -- Multi-consistency -- Pill -- Cervical Esophageal Comment -- Gabriel Rainwater MA, CCC-SLP [phone] McCoy Leah Meryl 11/03/2015, 10:29 AM               Microbiology: Recent Results (from the past 240 hour(s))  Urine culture     Status: None   Collection Time: 10/31/15  1:08 PM  Result Value Ref Range Status   Specimen Description URINE, CATHETERIZED  Final   Special Requests NONE  Final   Culture NO GROWTH 1 DAY  Final   Report Status 11/01/2015 FINAL  Final  Blood Culture (routine x 2)     Status: None (Preliminary result)   Collection Time: 10/31/15  1:50 PM  Result Value Ref Range Status   Specimen Description BLOOD LEFT FOREARM  Final   Special Requests BOTTLES DRAWN AEROBIC AND ANAEROBIC 5CC  Final   Culture NO GROWTH 3 DAYS  Final   Report Status PENDING  Incomplete  Blood Culture (routine x 2)     Status: None (Preliminary result)  Collection Time: 10/31/15  2:05 PM  Result Value Ref Range Status   Specimen Description BLOOD RIGHT FOREARM  Final   Special Requests BOTTLES DRAWN AEROBIC AND ANAEROBIC 5CC  Final   Culture NO GROWTH 3 DAYS  Final   Report Status PENDING  Incomplete  MRSA PCR Screening     Status: Abnormal   Collection Time: 10/31/15  5:42 PM  Result Value Ref Range Status   MRSA by PCR POSITIVE (A) NEGATIVE Final    Comment:        The GeneXpert MRSA Assay  (FDA approved for NASAL specimens only), is one component of a comprehensive MRSA colonization surveillance program. It is not intended to diagnose MRSA infection nor to guide or monitor treatment for MRSA infections. RESULT CALLED TO, READ BACK BY AND VERIFIED WITH: ANTELLONE,J.,RN AT 2007 BY L.PITT 10/31/15      Labs: Basic Metabolic Panel:  Recent Labs Lab 10/31/15 1350 11/01/15 0626 11/02/15 0438 11/03/15 0345  NA 141 142 143 144  K 3.7 4.4 3.8 3.3*  CL 112* 109 111 108  CO2 21* 22 23 26   GLUCOSE 99 152* 157* 118*  BUN 12 18 28* 27*  CREATININE 0.72 0.91 1.08 0.95  CALCIUM 7.3* 8.9 8.8* 8.7*   Liver Function Tests:  Recent Labs Lab 10/31/15 1350  AST 15  ALT 11*  ALKPHOS 72  BILITOT 0.5  PROT 5.1*  ALBUMIN 2.5*   CBC:  Recent Labs Lab 10/31/15 1350 11/01/15 0626 11/02/15 0438 11/03/15 0345  WBC 6.4 4.6 7.8 8.5  NEUTROABS 3.9  --   --   --   HGB 8.8* 10.7* 10.1* 10.1*  HCT 26.3* 33.1* 30.3* 30.5*  MCV 91.6 90.4 90.7 89.4  PLT 213 264 241 268   BNP: BNP (last 3 results)  Recent Labs  02/05/15 0105 03/29/15 0422 10/31/15 1350  BNP 144.1* 327.5* 382.1*    Signed:  GHERGHE, COSTIN  [facility] 11/04/2015, 9:48 AM

## 2015-11-05 LAB — CULTURE, BLOOD (ROUTINE X 2)
CULTURE: NO GROWTH
Culture: NO GROWTH

## 2015-11-06 ENCOUNTER — Non-Acute Institutional Stay (SKILLED_NURSING_FACILITY): Payer: Medicare Other | Admitting: Internal Medicine

## 2015-11-06 ENCOUNTER — Encounter: Payer: Self-pay | Admitting: Internal Medicine

## 2015-11-06 DIAGNOSIS — I5032 Chronic diastolic (congestive) heart failure: Secondary | ICD-10-CM

## 2015-11-06 DIAGNOSIS — R627 Adult failure to thrive: Secondary | ICD-10-CM

## 2015-11-06 DIAGNOSIS — I482 Chronic atrial fibrillation, unspecified: Secondary | ICD-10-CM

## 2015-11-06 DIAGNOSIS — E034 Atrophy of thyroid (acquired): Secondary | ICD-10-CM

## 2015-11-06 DIAGNOSIS — J9611 Chronic respiratory failure with hypoxia: Secondary | ICD-10-CM

## 2015-11-06 DIAGNOSIS — J449 Chronic obstructive pulmonary disease, unspecified: Secondary | ICD-10-CM

## 2015-11-06 DIAGNOSIS — N400 Enlarged prostate without lower urinary tract symptoms: Secondary | ICD-10-CM

## 2015-11-06 DIAGNOSIS — I10 Essential (primary) hypertension: Secondary | ICD-10-CM | POA: Diagnosis not present

## 2015-11-06 DIAGNOSIS — R1310 Dysphagia, unspecified: Secondary | ICD-10-CM

## 2015-11-06 DIAGNOSIS — E43 Unspecified severe protein-calorie malnutrition: Secondary | ICD-10-CM

## 2015-11-06 DIAGNOSIS — D509 Iron deficiency anemia, unspecified: Secondary | ICD-10-CM | POA: Diagnosis not present

## 2015-11-06 DIAGNOSIS — E038 Other specified hypothyroidism: Secondary | ICD-10-CM

## 2015-11-06 NOTE — Progress Notes (Signed)
Patient ID: JAHLIL ZILLER, male   DOB: 1924/09/06, 82 y.o.   MRN: [id number]    HISTORY AND PHYSICAL   DATE: 11/06/15  Location:  [facility]    Place of Service: SNF (31)   Extended Emergency Contact Information Primary Emergency Contact: Reymundo Poll [place] of [place] Home Phone: [phone] Relation: Daughter Secondary Emergency Contact: Dara Lords [place] of [place] Mobile Phone: [phone] Relation: Grandson  Advanced Directive information  DNR  Chief Complaint  Patient presents with  . Readmit To SNF    HPI:  82 yo male long term resident seen today as a readmission into SNF following hospital stay for sepsi due to HCAP vs aspiration pneumonia, COPD, acute encephalopathy, afib on xeralto, acute respiratory failure with hypoxia, FTT, chronic diastolic HF, hypothyroid and hyperlipidemia. Sepsis tx with IV vanco/zosyn--> narrowed to augmentin at d/c x 5 days. MRSA screen (+). Albumin 2.5. Hgb 8.8-->10.1 at d/c. BNP 382. He presents to SNF for long term care.  He has no c/o today. (+) cough. Daughter at bedside. He is a poor historian due to dementia. Hx obtained from chart.  Dementia/depression - takes no meds for cognition. Mood stable on effexor  afib - rate controlled without medication; takes xeralto for anticogaulation  COPD/allergic rhinitis - stable on advair, flonase and prn xopenex nebs and duonebs. He uses [place] O2 ATC  HTN/dCHF- BP stable on cozaar. HR controlled without rate-limiting med. Takes kdur daily  Fe deficiency anemia - stable on ferrous sulfate  Hypothyroidism - stable on levothyroxine  Hyperlipidemia - stable on pravastatin. LDL  GERD/dysphagia - takes omeprazole. Diet is mechanical soft with honey thick liqs  BPH - sx's stable on flomax. He has chronic foley cath due to urinary retention  Past Medical History  Diagnosis Date  . Asthma   . Hypertension   . A-fib (HCC)   . Hyperlipidemia   . Shortness  of breath   . CHF (congestive heart failure) (HCC) 10/28/2011  . Forgetfulness   . PVC's (premature ventricular contractions)   . Mild aortic insufficiency   . Dementia     Hattie Perch 02/05/2015  . Failure to thrive in adult     /notes 02/05/2015  . COPD (chronic obstructive pulmonary disease) (HCC)   . Hypothyroidism     Hattie Perch 02/05/2015  . Fall 02/04/2015    with altered mental status off of his baseline after he suffered a mechanical fall /notes 02/04/2015  . CAP (community acquired pneumonia)     Hattie Perch 02/05/2015  . Colon cancer [facility])     Past Surgical History  Procedure Laterality Date  . Colon surgery      Partial hemecolectomy   . Central line insertion  09/08/2011         Patient Care Team: Donita Brooks, MD as PCP - General (Family Medicine)  Social History   Social History  . Marital Status: Widowed    Spouse Name: N/A  . Number of Children: 2  . Years of Education: 12   Occupational History  . 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 Smoking status: Former Games developer  . Smokeless tobacco: Not on file     Comment: quit 30+ yrs ago  . Alcohol Use: No  . Drug Use: No  . Sexual Activity: No   Other Topics Concern  . Not on file   Social History Narrative   [place] man. 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 Widowed Jan 27, 2014. Had a son who died at 61. Has a daughter, 2 grandchildren, 2 great-grandchildren. Reports that he was living with his daughter     reports that he has quit smoking. He does not have any smokeless tobacco history on file. He reports that he does not drink alcohol or use illicit drugs.  Family History  Problem Relation Age of Onset  . Hyperlipidemia Father   . Hypertension Father   . Heart disease Father   . Asthma Father   . Stroke Father   . Heart failure Father   . Heart disease Mother   . Heart failure Mother   . Heart attack  Sister    Family Status  Relation Status Death Age  . Father Deceased 69    heart failure  . Mother Deceased 57    heart failure  . Sister Deceased 29    MI    Immunization History  Administered Date(s) Administered  . Influenza Whole 07/02/2009, 09/10/2011  . Influenza,inj,Quad PF,36+ Mos 08/28/2013, 09/05/2014  . Pneumococcal Conjugate-13 09/05/2014  . Pneumococcal Polysaccharide-23 11/01/2006  . Zoster 08/26/2006    No Known Allergies  Medications: Patient's Medications  New Prescriptions   No medications on file  Previous Medications   ACETAMINOPHEN (TYLENOL) 325 MG TABLET    Take 650 mg by mouth every 6 (six) hours as needed for mild pain.   ADVAIR DISKUS 500-50 MCG/DOSE AEPB    INHALE 1 PUFF TWICE A DAY   AMOXICILLIN-CLAVULANATE (AUGMENTIN) 875-125 MG TABLET    Take 1 tablet by mouth 2 (two) times daily.   ARTIFICIAL TEAR SOLUTION OP    Apply to eye. Instill 2 drops into both eyes four times daily as needed   FEEDING SUPPLEMENT, ENSURE ENLIVE, (ENSURE ENLIVE) LIQD    Take 237 mLs by mouth 2 (two) times daily between meals.   FERROUS SULFATE 325 (65 FE) MG TABLET    TAKE 1 TABLET BY MOUTH EVERY DAY   FLUTICASONE (FLONASE) 50 MCG/ACT NASAL SPRAY    Place 2 sprays into both nostrils daily.   GUAIFENESIN 200 MG TABLET    Take 200 mg by mouth every 4 (four) hours as needed for cough or to loosen phlegm.   IPRATROPIUM-ALBUTEROL (DUONEB) 0.5-2.5 (3) MG/3ML SOLN    Take 3 mLs by nebulization. Every 4-6 hours as needed for SOB/wheezing   LEVALBUTEROL (XOPENEX) 0.63 MG/3ML NEBULIZER SOLUTION    Take 3 mLs (0.63 mg total) by nebulization every 4 (four) hours as needed for wheezing or shortness of breath.   LEVOTHYROXINE (SYNTHROID, LEVOTHROID) 25 MCG TABLET    TAKE 1 TABLET BY MOUTH EVERY DAY   LOSARTAN (COZAAR) 50 MG TABLET    Take 50 mg by mouth daily.   OMEPRAZOLE (PRILOSEC) 40 MG CAPSULE    Take 40 mg by mouth daily.   POTASSIUM CHLORIDE SA (K-DUR,KLOR-CON) 20 MEQ TABLET     Take 40 mEq by mouth daily.   PRAVASTATIN (PRAVACHOL) 20 MG TABLET    Take 20 mg by mouth daily.   RIVAROXABAN (XARELTO) 20 MG TABS TABLET    Take 1 tablet (20 mg total) by mouth daily with supper.   TAMSULOSIN (FLOMAX) 0.4 MG CAPS CAPSULE    Take 1 capsule (0.4 mg total) by mouth daily.   VENLAFAXINE XR (EFFEXOR XR) 37.5 MG 24 HR CAPSULE    Take 2 capsules (75 mg total) by  mouth daily with breakfast. 2 pills in AM  Modified Medications   No medications on file  Discontinued Medications   No medications on file    Review of Systems  Unable to perform ROS: Dementia    Filed Vitals:   11/06/15 1638  BP: 118/66  Pulse: 62  Temp: 98.6 F (37 C)  Weight: 134 lb (60.782 kg)  SpO2: 98%   Body mass index is 18.17 kg/(m^2).  Physical Exam  Constitutional: He appears well-developed.  Lying in bed in NAD, [place] O2 intact, frail appearing  HENT:  Mouth/Throat: Oropharynx is clear and moist.  Eyes: Pupils are equal, round, and reactive to light. No scleral icterus.  Neck: Neck supple. Carotid bruit is not present. No thyromegaly present.  Cardiovascular: Normal rate, regular rhythm and intact distal pulses.  Exam reveals no gallop and no friction rub.   Murmur (1/6 SEM) heard. no distal LE swelling. No calf TTP  Pulmonary/Chest: Effort normal. No respiratory distress. He has no wheezes. He has no rhonchi. He has no rales. He exhibits no tenderness.  Prolonged expiratory phase  Abdominal: Soft. Bowel sounds are normal. He exhibits no distension, no abdominal bruit, no pulsatile midline mass and no mass. There is no tenderness. There is no rebound and no guarding.  Genitourinary:  Foley cath DTG clear yellow urine  Musculoskeletal: He exhibits edema.  Lymphadenopathy:    He has no cervical adenopathy.  Neurological: He is alert.  Skin: Skin is warm and dry. No rash noted.  Psychiatric: He has a normal mood and affect. His behavior is normal.     Labs reviewed: Admission on  10/31/2015, Discharged on 11/04/2015  Component Date Value Ref Range Status  . Lactic Acid, Venous 10/31/2015 1.38  0.5 - 2.0 mmol/L Final  . Sodium 10/31/2015 141  135 - 145 mmol/L Final  . Potassium 10/31/2015 3.7  3.5 - 5.1 mmol/L Final  . Chloride 10/31/2015 112* 101 - 111 mmol/L Final  . CO2 10/31/2015 21* 22 - 32 mmol/L Final  . Glucose, Bld 10/31/2015 99  65 - 99 mg/dL Final  . BUN 10/31/2015 12  6 - 20 mg/dL Final  . Creatinine, Ser 10/31/2015 0.72  0.61 - 1.24 mg/dL Final  . Calcium 10/31/2015 7.3* 8.9 - 10.3 mg/dL Final  . Total Protein 10/31/2015 5.1* 6.5 - 8.1 g/dL Final  . Albumin 10/31/2015 2.5* 3.5 - 5.0 g/dL Final  . AST 10/31/2015 15  15 - 41 U/L Final  . ALT 10/31/2015 11* 17 - 63 U/L Final  . Alkaline Phosphatase 10/31/2015 72  38 - 126 U/L Final  . Total Bilirubin 10/31/2015 0.5  0.3 - 1.2 mg/dL Final  . GFR calc non Af Amer 10/31/2015 >60  >60 mL/min Final  . GFR calc Af Amer 10/31/2015 >60  >60 mL/min Final   Comment: (NOTE) The eGFR has been calculated using the CKD EPI equation. This calculation has not been validated in all clinical situations. eGFR's persistently <60 mL/min signify possible Chronic Kidney Disease.   . Anion gap 10/31/2015 8  5 - 15 Final  . WBC 10/31/2015 6.4  4.0 - 10.5 K/uL Final  . RBC 10/31/2015 2.87* 4.22 - 5.81 MIL/uL Final  . Hemoglobin 10/31/2015 8.8* 13.0 - 17.0 g/dL Final  . HCT 10/31/2015 26.3* 39.0 - 52.0 % Final  . MCV 10/31/2015 91.6  78.0 - 100.0 fL Final  . MCH 10/31/2015 30.7  26.0 - 34.0 pg Final  . MCHC 10/31/2015 33.5  30.0 - 36.0  g/dL Final  . RDW 10/31/2015 14.9  11.5 - 15.5 % Final  . Platelets 10/31/2015 213  150 - 400 K/uL Final  . Neutrophils Relative % 10/31/2015 60   Final  . Neutro Abs 10/31/2015 3.9  1.7 - 7.7 K/uL Final  . Lymphocytes Relative 10/31/2015 19   Final  . Lymphs Abs 10/31/2015 1.2  0.7 - 4.0 K/uL Final  . Monocytes Relative 10/31/2015 18   Final  . Monocytes Absolute 10/31/2015 1.2* 0.1 -  1.0 K/uL Final  . Eosinophils Relative 10/31/2015 2   Final  . Eosinophils Absolute 10/31/2015 0.1  0.0 - 0.7 K/uL Final  . Basophils Relative 10/31/2015 1   Final  . Basophils Absolute 10/31/2015 0.0  0.0 - 0.1 K/uL Final  . Specimen Description 10/31/2015 BLOOD RIGHT FOREARM   Final  . Special Requests 10/31/2015 BOTTLES DRAWN AEROBIC AND ANAEROBIC 5CC   Final  . Culture 10/31/2015 NO GROWTH 5 DAYS   Final  . Report Status 10/31/2015 11/05/2015 FINAL   Final  . Specimen Description 10/31/2015 BLOOD LEFT FOREARM   Final  . Special Requests 10/31/2015 BOTTLES DRAWN AEROBIC AND ANAEROBIC 5CC   Final  . Culture 10/31/2015 NO GROWTH 5 DAYS   Final  . Report Status 10/31/2015 11/05/2015 FINAL   Final  . Color, Urine 10/31/2015 YELLOW  YELLOW Final  . APPearance 10/31/2015 CLEAR  CLEAR Final  . Specific Gravity, Urine 10/31/2015 1.014  1.005 - 1.030 Final  . pH 10/31/2015 6.5  5.0 - 8.0 Final  . Glucose, UA 10/31/2015 NEGATIVE  NEGATIVE mg/dL Final  . Hgb urine dipstick 10/31/2015 NEGATIVE  NEGATIVE Final  . Bilirubin Urine 10/31/2015 NEGATIVE  NEGATIVE Final  . Ketones, ur 10/31/2015 NEGATIVE  NEGATIVE mg/dL Final  . Protein, ur 10/31/2015 NEGATIVE  NEGATIVE mg/dL Final  . Nitrite 10/31/2015 NEGATIVE  NEGATIVE Final  . Leukocytes, UA 10/31/2015 NEGATIVE  NEGATIVE Final   MICROSCOPIC NOT DONE ON URINES WITH NEGATIVE PROTEIN, BLOOD, LEUKOCYTES, NITRITE, OR GLUCOSE <1000 mg/dL.  Marland Kitchen Specimen Description 10/31/2015 URINE, CATHETERIZED   Final  . Special Requests 10/31/2015 NONE   Final  . Culture 10/31/2015 NO GROWTH 1 DAY   Final  . Report Status 10/31/2015 11/01/2015 FINAL   Final  . B Natriuretic Peptide 10/31/2015 382.1* 0.0 - 100.0 pg/mL Final  . Sodium 11/01/2015 142  135 - 145 mmol/L Final  . Potassium 11/01/2015 4.4  3.5 - 5.1 mmol/L Final  . Chloride 11/01/2015 109  101 - 111 mmol/L Final  . CO2 11/01/2015 22  22 - 32 mmol/L Final  . Glucose, Bld 11/01/2015 152* 65 - 99 mg/dL Final    . BUN 11/01/2015 18  6 - 20 mg/dL Final  . Creatinine, Ser 11/01/2015 0.91  0.61 - 1.24 mg/dL Final  . Calcium 11/01/2015 8.9  8.9 - 10.3 mg/dL Final  . GFR calc non Af Amer 11/01/2015 >60  >60 mL/min Final  . GFR calc Af Amer 11/01/2015 >60  >60 mL/min Final   Comment: (NOTE) The eGFR has been calculated using the CKD EPI equation. This calculation has not been validated in all clinical situations. eGFR's persistently <60 mL/min signify possible Chronic Kidney Disease.   . Anion gap 11/01/2015 11  5 - 15 Final  . WBC 11/01/2015 4.6  4.0 - 10.5 K/uL Final  . RBC 11/01/2015 3.66* 4.22 - 5.81 MIL/uL Final  . Hemoglobin 11/01/2015 10.7* 13.0 - 17.0 g/dL Final  . HCT 11/01/2015 33.1* 39.0 - 52.0 % Final  .  MCV 11/01/2015 90.4  78.0 - 100.0 fL Final  . MCH 11/01/2015 29.2  26.0 - 34.0 pg Final  . MCHC 11/01/2015 32.3  30.0 - 36.0 g/dL Final  . RDW 11/01/2015 14.8  11.5 - 15.5 % Final  . Platelets 11/01/2015 264  150 - 400 K/uL Final  . MRSA by PCR 10/31/2015 POSITIVE* NEGATIVE Final   Comment:        The GeneXpert MRSA Assay (FDA approved for NASAL specimens only), is one component of a comprehensive MRSA colonization surveillance program. It is not intended to diagnose MRSA infection nor to guide or monitor treatment for MRSA infections. RESULT CALLED TO, READ BACK BY AND VERIFIED WITH: ANTELLONE,J.,RN AT 2007 BY L.PITT 10/31/15   . WBC 11/02/2015 7.8  4.0 - 10.5 K/uL Final  . RBC 11/02/2015 3.34* 4.22 - 5.81 MIL/uL Final  . Hemoglobin 11/02/2015 10.1* 13.0 - 17.0 g/dL Final  . HCT 11/02/2015 30.3* 39.0 - 52.0 % Final  . MCV 11/02/2015 90.7  78.0 - 100.0 fL Final  . MCH 11/02/2015 30.2  26.0 - 34.0 pg Final  . MCHC 11/02/2015 33.3  30.0 - 36.0 g/dL Final  . RDW 11/02/2015 14.8  11.5 - 15.5 % Final  . Platelets 11/02/2015 241  150 - 400 K/uL Final  . Sodium 11/02/2015 143  135 - 145 mmol/L Final  . Potassium 11/02/2015 3.8  3.5 - 5.1 mmol/L Final  . Chloride 11/02/2015 111   101 - 111 mmol/L Final  . CO2 11/02/2015 23  22 - 32 mmol/L Final  . Glucose, Bld 11/02/2015 157* 65 - 99 mg/dL Final  . BUN 11/02/2015 28* 6 - 20 mg/dL Final  . Creatinine, Ser 11/02/2015 1.08  0.61 - 1.24 mg/dL Final  . Calcium 11/02/2015 8.8* 8.9 - 10.3 mg/dL Final  . GFR calc non Af Amer 11/02/2015 59* >60 mL/min Final  . GFR calc Af Amer 11/02/2015 >60  >60 mL/min Final   Comment: (NOTE) The eGFR has been calculated using the CKD EPI equation. This calculation has not been validated in all clinical situations. eGFR's persistently <60 mL/min signify possible Chronic Kidney Disease.   . Anion gap 11/02/2015 9  5 - 15 Final  . WBC 11/03/2015 8.5  4.0 - 10.5 K/uL Final  . RBC 11/03/2015 3.41* 4.22 - 5.81 MIL/uL Final  . Hemoglobin 11/03/2015 10.1* 13.0 - 17.0 g/dL Final  . HCT 11/03/2015 30.5* 39.0 - 52.0 % Final  . MCV 11/03/2015 89.4  78.0 - 100.0 fL Final  . MCH 11/03/2015 29.6  26.0 - 34.0 pg Final  . MCHC 11/03/2015 33.1  30.0 - 36.0 g/dL Final  . RDW 11/03/2015 14.9  11.5 - 15.5 % Final  . Platelets 11/03/2015 268  150 - 400 K/uL Final  . Sodium 11/03/2015 144  135 - 145 mmol/L Final  . Potassium 11/03/2015 3.3* 3.5 - 5.1 mmol/L Final  . Chloride 11/03/2015 108  101 - 111 mmol/L Final  . CO2 11/03/2015 26  22 - 32 mmol/L Final  . Glucose, Bld 11/03/2015 118* 65 - 99 mg/dL Final  . BUN 11/03/2015 27* 6 - 20 mg/dL Final  . Creatinine, Ser 11/03/2015 0.95  0.61 - 1.24 mg/dL Final  . Calcium 11/03/2015 8.7* 8.9 - 10.3 mg/dL Final  . GFR calc non Af Amer 11/03/2015 >60  >60 mL/min Final  . GFR calc Af Amer 11/03/2015 >60  >60 mL/min Final   Comment: (NOTE) The eGFR has been calculated using the CKD EPI equation. This  calculation has not been validated in all clinical situations. eGFR's persistently <60 mL/min signify possible Chronic Kidney Disease.   . Anion gap 11/03/2015 10  5 - 15 Final  Nursing Home on 10/17/2015  Component Date Value Ref Range Status  . TSH  09/15/2015 5.00  .41 - 5.90 uIU/mL Final  Nursing Home on 09/12/2015  Component Date Value Ref Range Status  . Hemoglobin 08/18/2015 10.8* 13.5 - 17.5 g/dL Final  . Platelets 08/18/2015 259  150 - 399 K/L Final  . WBC 08/18/2015 7.6   Final  . Glucose 08/18/2015 82   Final  . BUN 08/18/2015 20  4 - 21 mg/dL Final  . Creatinine 08/18/2015 1.0  0.6 - 1.3 mg/dL Final  . Potassium 08/18/2015 4.3  3.4 - 5.3 mmol/L Final  . Sodium 08/18/2015 140  137 - 147 mmol/L Final    Dg Chest Port 1 View  10/31/2015  CLINICAL DATA:  Sepsis EXAM: PORTABLE CHEST - 1 VIEW COMPARISON:  04/14/2015 FINDINGS: Cardiac shadow is mildly prominent but stable. Aortic calcifications are again seen. Mild apical scarring is noted and stable. Mild chronic interstitial changes are seen. No focal infiltrate or sizable effusion is noted. IMPRESSION: Chronic changes without acute abnormality. Electronically Signed   By: Inez Catalina M.D.   On: 10/31/2015 13:29   Dg Swallowing Func-speech Pathology  11/03/2015  Objective Swallowing Evaluation:   MBS Patient Details Name: HOBART MARTE MRN: [id number] Date of Birth: [birth year] Today's Date: 11/03/2015 Time: SLP Start Time (ACUTE ONLY): 0930-SLP Stop Time (ACUTE ONLY): 0942 SLP Time Calculation (min) (ACUTE ONLY): 12 min Past Medical History: '@PMH'$ @ Past Surgical History: Past Surgical History Procedure Laterality Date . Colon surgery     Partial hemecolectomy  . Central line insertion  09/08/2011     HPI: LUIE LANEVE is a 82 y.o. male has a past medical history significant for COPD, dementia, PNA (01/2015), Asthma, chronic A fib, HLD, diastolic CHF, brought from his SNF due to AMS and fever. Admitted for presumed sepsis due to aspiration PNA. Per notes, patient with episodes of vomiting, then became lethargic and febrile. Patient with h/o dysphagia requiring thickened liquids. Most recent MBS 03/10/2015 recommended dysphagia 3, thin liquids with emphasis on limiting bolus size for  prevention of aspiration.  No Data Recorded Assessment / Plan / Recommendation CHL IP CLINICAL IMPRESSIONS 11/03/2015 Therapy Diagnosis Severe pharyngeal phase dysphagia;Moderate pharyngeal phase dysphagia Clinical Impression Patient presents with a moderate-severe dysphagia, primarily pharyngeal in nature with both sensory and motor impairments. Patient with base of tongue, laryngeal, and pharyngeal weakness resulting with moderate-severe residuals with thicker consistencies and decreased airway protection both during and after the swallow. Unfortunately, thinner consistencies, which result in less residue, cause a delay in swallow initiation and penetration/aspiration before and during the swallow. No consistencies will fully prevent aspiration at this time. Cognitive status does not allow for effective carryout of compensatory strategies to either prevent or clear penetrates/aspirates. Will continue current diet at this time and discuss with POA.  Impact on safety and function Moderate aspiration risk;Severe aspiration risk   CHL IP TREATMENT RECOMMENDATION 11/03/2015 Treatment Recommendations Therapy as outlined in treatment plan below   Prognosis 11/03/2015 Prognosis for Safe Diet Advancement Guarded Barriers to Reach Goals Time post onset;Severity of deficits Barriers/Prognosis Comment -- CHL IP DIET RECOMMENDATION 11/03/2015 SLP Diet Recommendations Dysphagia 3 (M[place] soft) solids;Honey thick liquids Liquid Administration via Spoon Medication Administration Crushed with puree Compensations Slow rate;Small sips/bites;Multiple dry swallows after each bite/sip Postural Changes  Remain semi-upright after after feeds/meals (Comment)   CHL IP OTHER RECOMMENDATIONS 11/03/2015 Recommended Consults -- Oral Care Recommendations Oral care BID Other Recommendations Order thickener from pharmacy;Prohibited food (jello, ice cream, thin soups);Remove water pitcher   CHL IP FOLLOW UP RECOMMENDATIONS 11/03/2015 Follow up Recommendations  Skilled Nursing facility   [facility] IP FREQUENCY AND DURATION 11/03/2015 Speech Therapy Frequency (ACUTE ONLY) min 2x/week Treatment Duration 2 weeks      CHL IP ORAL PHASE 11/03/2015 Oral Phase WFL Oral - Pudding Teaspoon -- Oral - Pudding Cup -- Oral - Honey Teaspoon -- Oral - Honey Cup -- Oral - Nectar Teaspoon -- Oral - Nectar Cup -- Oral - Nectar Straw -- Oral - Thin Teaspoon -- Oral - Thin Cup -- Oral - Thin Straw -- Oral - Puree -- Oral - Mech Soft -- Oral - Regular -- Oral - Multi-Consistency -- Oral - Pill -- Oral Phase - Comment --  CHL IP PHARYNGEAL PHASE 11/03/2015 Pharyngeal Phase Impaired Pharyngeal- Pudding Teaspoon -- Pharyngeal -- Pharyngeal- Pudding Cup -- Pharyngeal -- Pharyngeal- Honey Teaspoon Delayed swallow initiation-pyriform sinuses;Penetration/Aspiration during swallow;Pharyngeal residue - valleculae;Pharyngeal residue - pyriform;Reduced airway/laryngeal closure;Reduced anterior laryngeal mobility;Reduced laryngeal elevation;Reduced tongue base retraction;Lateral channel residue Pharyngeal Material enters airway, CONTACTS cords and not ejected out Pharyngeal- Honey Cup -- Pharyngeal -- Pharyngeal- Nectar Teaspoon Delayed swallow initiation-pyriform sinuses;Penetration/Aspiration during swallow;Pharyngeal residue - valleculae;Pharyngeal residue - pyriform;Reduced airway/laryngeal closure;Reduced anterior laryngeal mobility;Reduced laryngeal elevation;Reduced tongue base retraction;Lateral channel residue Pharyngeal Material enters airway, CONTACTS cords and not ejected out Pharyngeal- Nectar Cup Delayed swallow initiation-pyriform sinuses;Penetration/Aspiration during swallow;Pharyngeal residue - valleculae;Pharyngeal residue - pyriform;Reduced airway/laryngeal closure;Reduced anterior laryngeal mobility;Reduced laryngeal elevation;Reduced tongue base retraction;Lateral channel residue Pharyngeal Material enters airway, CONTACTS cords and not ejected out;Material enters airway, passes BELOW cords  without attempt by patient to eject out (silent aspiration) Pharyngeal- Nectar Straw -- Pharyngeal -- Pharyngeal- Thin Teaspoon Delayed swallow initiation-pyriform sinuses;Penetration/Aspiration during swallow;Pharyngeal residue - valleculae;Pharyngeal residue - pyriform;Reduced airway/laryngeal closure;Reduced anterior laryngeal mobility;Reduced laryngeal elevation;Reduced tongue base retraction;Lateral channel residue Pharyngeal Material enters airway, CONTACTS cords and not ejected out;Material enters airway, passes BELOW cords without attempt by patient to eject out (silent aspiration) Pharyngeal- Thin Cup -- Pharyngeal -- Pharyngeal- Thin Straw Delayed swallow initiation-pyriform sinuses;Penetration/Aspiration during swallow;Pharyngeal residue - valleculae;Pharyngeal residue - pyriform;Reduced airway/laryngeal closure;Reduced anterior laryngeal mobility;Reduced laryngeal elevation;Reduced tongue base retraction;Lateral channel residue Pharyngeal Material enters airway, passes BELOW cords without attempt by patient to eject out (silent aspiration);Material enters airway, CONTACTS cords and not ejected out Pharyngeal- Puree Pharyngeal residue - valleculae;Pharyngeal residue - pyriform;Reduced airway/laryngeal closure;Reduced anterior laryngeal mobility;Reduced laryngeal elevation;Reduced tongue base retraction;Delayed swallow initiation-vallecula Pharyngeal Material does not enter airway Pharyngeal- Mechanical Soft Pharyngeal residue - valleculae;Pharyngeal residue - pyriform;Reduced airway/laryngeal closure;Reduced anterior laryngeal mobility;Reduced laryngeal elevation;Reduced tongue base retraction;Lateral channel residue;Delayed swallow initiation-vallecula Pharyngeal Material does not enter airway Pharyngeal- Regular -- Pharyngeal -- Pharyngeal- Multi-consistency -- Pharyngeal -- Pharyngeal- Pill -- Pharyngeal -- Pharyngeal Comment --  CHL IP CERVICAL ESOPHAGEAL PHASE 11/03/2015 Cervical Esophageal Phase WFL  Pudding Teaspoon -- Pudding Cup -- Honey Teaspoon -- Honey Cup -- Nectar Teaspoon -- Nectar Cup -- Nectar Straw -- Thin Teaspoon -- Thin Cup -- Thin Straw -- Puree -- Mechanical Soft -- Regular -- Multi-consistency -- Pill -- Cervical Esophageal Comment -- Gabriel Rainwater MA, CCC-SLP [phone] McCoy Leah Meryl 11/03/2015, 10:29 AM                Assessment/Plan   ICD-9-CM ICD-10-CM   1. Chronic obstructive pulmonary disease, unspecified COPD type ([place])  496 J44.9   2. Chronic diastolic CHF (congestive heart failure) (HCC) 428.32 I50.32    428.0    3. Protein-calorie malnutrition, severe ([place]) 262 E43   4. Failure to thrive in adult 783.7 R62.7   5. Anemia, iron deficiency 280.9 D50.9   6. Chronic atrial fibrillation (HCC) 427.31 I48.2   7. Hypothyroidism due to acquired atrophy of thyroid 244.8 E03.8    246.8 E03.4   8. Essential hypertension 401.9 I10   9. BPH (benign prostatic hyperplasia) 600.00 N40.0    with chronic indwelling foley cath  10. Dysphagia 787.20 R13.10   11. Chronic respiratory failure with hypoxia (HCC) 518.83 J96.11    799.02     on [facility] O2 ATC    ST as ordered for dysphagia  PT/OT as indicated  cont current meds as ordered. Finish abx  Aspiration precautions  Foley cath care as indicated  Nutritional supplements as indicated  F/u with specialists as indicated  GOAL: long term care. S/w daughter at bedside. Communicated with pt and nursing.  Will follow  Marcelus Dubberly S. Perlie Gold  [facility] [address] [place], [place] [address] [phone] Cell (Monday-Friday 8 AM - 5 PM) [phone] After 5 PM and follow prompts

## 2015-11-27 ENCOUNTER — Non-Acute Institutional Stay (SKILLED_NURSING_FACILITY): Payer: Medicare Other | Admitting: Internal Medicine

## 2015-11-27 ENCOUNTER — Emergency Department (HOSPITAL_COMMUNITY): Payer: Medicare Other

## 2015-11-27 ENCOUNTER — Encounter: Payer: Self-pay | Admitting: Internal Medicine

## 2015-11-27 ENCOUNTER — Encounter (HOSPITAL_COMMUNITY): Payer: Self-pay | Admitting: Emergency Medicine

## 2015-11-27 ENCOUNTER — Inpatient Hospital Stay (HOSPITAL_COMMUNITY)
Admission: EM | Admit: 2015-11-27 | Discharge: 2015-12-04 | DRG: 871 | Disposition: A | Discharge status: Skilled Nursing Facility | Payer: Medicare Other | Attending: Internal Medicine | Admitting: Internal Medicine

## 2015-11-27 DIAGNOSIS — F0390 Unspecified dementia without behavioral disturbance: Secondary | ICD-10-CM | POA: Diagnosis present

## 2015-11-27 DIAGNOSIS — Z8701 Personal history of pneumonia (recurrent): Secondary | ICD-10-CM | POA: Diagnosis not present

## 2015-11-27 DIAGNOSIS — Z79899 Other long term (current) drug therapy: Secondary | ICD-10-CM

## 2015-11-27 DIAGNOSIS — R0600 Dyspnea, unspecified: Secondary | ICD-10-CM

## 2015-11-27 DIAGNOSIS — J45909 Unspecified asthma, uncomplicated: Secondary | ICD-10-CM | POA: Diagnosis present

## 2015-11-27 DIAGNOSIS — J440 Chronic obstructive pulmonary disease with acute lower respiratory infection: Secondary | ICD-10-CM | POA: Diagnosis present

## 2015-11-27 DIAGNOSIS — J189 Pneumonia, unspecified organism: Secondary | ICD-10-CM

## 2015-11-27 DIAGNOSIS — D509 Iron deficiency anemia, unspecified: Secondary | ICD-10-CM | POA: Diagnosis present

## 2015-11-27 DIAGNOSIS — R1310 Dysphagia, unspecified: Secondary | ICD-10-CM

## 2015-11-27 DIAGNOSIS — Z7951 Long term (current) use of inhaled steroids: Secondary | ICD-10-CM | POA: Diagnosis not present

## 2015-11-27 DIAGNOSIS — Y95 Nosocomial condition: Secondary | ICD-10-CM | POA: Diagnosis present

## 2015-11-27 DIAGNOSIS — R739 Hyperglycemia, unspecified: Secondary | ICD-10-CM | POA: Diagnosis present

## 2015-11-27 DIAGNOSIS — I482 Chronic atrial fibrillation, unspecified: Secondary | ICD-10-CM

## 2015-11-27 DIAGNOSIS — J449 Chronic obstructive pulmonary disease, unspecified: Secondary | ICD-10-CM

## 2015-11-27 DIAGNOSIS — I5032 Chronic diastolic (congestive) heart failure: Secondary | ICD-10-CM | POA: Diagnosis present

## 2015-11-27 DIAGNOSIS — Z681 Body mass index (BMI) 19 or less, adult: Secondary | ICD-10-CM | POA: Diagnosis not present

## 2015-11-27 DIAGNOSIS — Z66 Do not resuscitate: Secondary | ICD-10-CM | POA: Diagnosis present

## 2015-11-27 DIAGNOSIS — E785 Hyperlipidemia, unspecified: Secondary | ICD-10-CM | POA: Diagnosis present

## 2015-11-27 DIAGNOSIS — J9621 Acute and chronic respiratory failure with hypoxia: Secondary | ICD-10-CM

## 2015-11-27 DIAGNOSIS — A419 Sepsis, unspecified organism: Principal | ICD-10-CM | POA: Diagnosis present

## 2015-11-27 DIAGNOSIS — E039 Hypothyroidism, unspecified: Secondary | ICD-10-CM | POA: Diagnosis present

## 2015-11-27 DIAGNOSIS — I110 Hypertensive heart disease with heart failure: Secondary | ICD-10-CM | POA: Diagnosis present

## 2015-11-27 DIAGNOSIS — Z515 Encounter for palliative care: Secondary | ICD-10-CM | POA: Diagnosis not present

## 2015-11-27 DIAGNOSIS — J441 Chronic obstructive pulmonary disease with (acute) exacerbation: Secondary | ICD-10-CM | POA: Diagnosis present

## 2015-11-27 DIAGNOSIS — J690 Pneumonitis due to inhalation of food and vomit: Secondary | ICD-10-CM | POA: Diagnosis present

## 2015-11-27 DIAGNOSIS — J9622 Acute and chronic respiratory failure with hypercapnia: Secondary | ICD-10-CM | POA: Diagnosis present

## 2015-11-27 DIAGNOSIS — H9190 Unspecified hearing loss, unspecified ear: Secondary | ICD-10-CM | POA: Diagnosis present

## 2015-11-27 DIAGNOSIS — E43 Unspecified severe protein-calorie malnutrition: Secondary | ICD-10-CM | POA: Diagnosis present

## 2015-11-27 DIAGNOSIS — E872 Acidosis: Secondary | ICD-10-CM | POA: Diagnosis present

## 2015-11-27 DIAGNOSIS — T380X5A Adverse effect of glucocorticoids and synthetic analogues, initial encounter: Secondary | ICD-10-CM | POA: Diagnosis present

## 2015-11-27 DIAGNOSIS — E038 Other specified hypothyroidism: Secondary | ICD-10-CM

## 2015-11-27 DIAGNOSIS — R64 Cachexia: Secondary | ICD-10-CM | POA: Diagnosis present

## 2015-11-27 DIAGNOSIS — R0603 Acute respiratory distress: Secondary | ICD-10-CM

## 2015-11-27 DIAGNOSIS — E034 Atrophy of thyroid (acquired): Secondary | ICD-10-CM

## 2015-11-27 DIAGNOSIS — E875 Hyperkalemia: Secondary | ICD-10-CM | POA: Diagnosis present

## 2015-11-27 DIAGNOSIS — J9601 Acute respiratory failure with hypoxia: Secondary | ICD-10-CM | POA: Diagnosis present

## 2015-11-27 DIAGNOSIS — R0602 Shortness of breath: Secondary | ICD-10-CM | POA: Diagnosis present

## 2015-11-27 DIAGNOSIS — Z87891 Personal history of nicotine dependence: Secondary | ICD-10-CM

## 2015-11-27 LAB — CBC WITH DIFFERENTIAL/PLATELET
BASOS ABS: 0 10*3/uL (ref 0.0–0.1)
BASOS PCT: 0 %
Eosinophils Absolute: 0.3 10*3/uL (ref 0.0–0.7)
Eosinophils Relative: 3 %
HEMATOCRIT: 37.6 % — AB (ref 39.0–52.0)
Hemoglobin: 12.1 g/dL — ABNORMAL LOW (ref 13.0–17.0)
LYMPHS PCT: 10 %
Lymphs Abs: 1.1 10*3/uL (ref 0.7–4.0)
MCH: 30.6 pg (ref 26.0–34.0)
MCHC: 32.2 g/dL (ref 30.0–36.0)
MCV: 95.2 fL (ref 78.0–100.0)
Monocytes Absolute: 2.2 10*3/uL — ABNORMAL HIGH (ref 0.1–1.0)
Monocytes Relative: 20 %
NEUTROS ABS: 7.4 10*3/uL (ref 1.7–7.7)
Neutrophils Relative %: 67 %
PLATELETS: 382 10*3/uL (ref 150–400)
RBC: 3.95 MIL/uL — AB (ref 4.22–5.81)
RDW: 15.1 % (ref 11.5–15.5)
WBC: 11.1 10*3/uL — AB (ref 4.0–10.5)

## 2015-11-27 LAB — I-STAT CG4 LACTIC ACID, ED: LACTIC ACID, VENOUS: 5.37 mmol/L — AB (ref 0.5–2.0)

## 2015-11-27 LAB — BRAIN NATRIURETIC PEPTIDE: B Natriuretic Peptide: 479.5 pg/mL — ABNORMAL HIGH (ref 0.0–100.0)

## 2015-11-27 LAB — I-STAT ARTERIAL BLOOD GAS, ED
ACID-BASE DEFICIT: 6 mmol/L — AB (ref 0.0–2.0)
BICARBONATE: 25.8 meq/L — AB (ref 20.0–24.0)
O2 Saturation: 100 %
PH ART: 7.122 — AB (ref 7.350–7.450)
PO2 ART: 322 mmHg — AB (ref 80.0–100.0)
TCO2: 28 mmol/L (ref 0–100)
pCO2 arterial: 79.2 mmHg (ref 35.0–45.0)

## 2015-11-27 LAB — URINE MICROSCOPIC-ADD ON

## 2015-11-27 LAB — COMPREHENSIVE METABOLIC PANEL
ALBUMIN: 3.5 g/dL (ref 3.5–5.0)
ALK PHOS: 94 U/L (ref 38–126)
ALT: 13 U/L — ABNORMAL LOW (ref 17–63)
ANION GAP: 13 (ref 5–15)
AST: 20 U/L (ref 15–41)
BILIRUBIN TOTAL: 0.1 mg/dL — AB (ref 0.3–1.2)
BUN: 16 mg/dL (ref 6–20)
CALCIUM: 9.6 mg/dL (ref 8.9–10.3)
CO2: 22 mmol/L (ref 22–32)
Chloride: 107 mmol/L (ref 101–111)
Creatinine, Ser: 0.91 mg/dL (ref 0.61–1.24)
Glucose, Bld: 161 mg/dL — ABNORMAL HIGH (ref 65–99)
POTASSIUM: 5.2 mmol/L — AB (ref 3.5–5.1)
Sodium: 142 mmol/L (ref 135–145)
TOTAL PROTEIN: 7.3 g/dL (ref 6.5–8.1)

## 2015-11-27 LAB — I-STAT CHEM 8, ED
BUN: 20 mg/dL (ref 6–20)
CALCIUM ION: 1.28 mmol/L (ref 1.13–1.30)
CHLORIDE: 106 mmol/L (ref 101–111)
Creatinine, Ser: 0.8 mg/dL (ref 0.61–1.24)
GLUCOSE: 157 mg/dL — AB (ref 65–99)
HCT: 41 % (ref 39.0–52.0)
HEMOGLOBIN: 13.9 g/dL (ref 13.0–17.0)
Potassium: 4.9 mmol/L (ref 3.5–5.1)
SODIUM: 142 mmol/L (ref 135–145)
TCO2: 25 mmol/L (ref 0–100)

## 2015-11-27 LAB — URINALYSIS, ROUTINE W REFLEX MICROSCOPIC
BILIRUBIN URINE: NEGATIVE
GLUCOSE, UA: NEGATIVE mg/dL
KETONES UR: NEGATIVE mg/dL
Nitrite: NEGATIVE
PROTEIN: 30 mg/dL — AB
Specific Gravity, Urine: 1.024 (ref 1.005–1.030)
pH: 5 (ref 5.0–8.0)

## 2015-11-27 LAB — I-STAT TROPONIN, ED: TROPONIN I, POC: 0.07 ng/mL (ref 0.00–0.08)

## 2015-11-27 LAB — LACTIC ACID, PLASMA: Lactic Acid, Venous: 4.8 mmol/L (ref 0.5–2.0)

## 2015-11-27 LAB — TROPONIN I: TROPONIN I: 0.04 ng/mL — AB (ref <0.031)

## 2015-11-27 LAB — MRSA PCR SCREENING: MRSA BY PCR: POSITIVE — AB

## 2015-11-27 MED ORDER — DEXTROSE 5 % IV SOLN
2.0000 g | Freq: Once | INTRAVENOUS | Status: AC
  Administered 2015-11-27: 2 g via INTRAVENOUS
  Filled 2015-11-27: qty 2

## 2015-11-27 MED ORDER — ENOXAPARIN SODIUM 60 MG/0.6ML ~~LOC~~ SOLN
60.0000 mg | Freq: Two times a day (BID) | SUBCUTANEOUS | Status: DC
  Administered 2015-11-28 – 2015-12-03 (×11): 60 mg via SUBCUTANEOUS
  Filled 2015-11-27 (×13): qty 0.6

## 2015-11-27 MED ORDER — IPRATROPIUM-ALBUTEROL 0.5-2.5 (3) MG/3ML IN SOLN
3.0000 mL | Freq: Once | RESPIRATORY_TRACT | Status: AC
  Administered 2015-11-27: 3 mL via RESPIRATORY_TRACT
  Filled 2015-11-27: qty 3

## 2015-11-27 MED ORDER — VANCOMYCIN HCL 500 MG IV SOLR
500.0000 mg | Freq: Two times a day (BID) | INTRAVENOUS | Status: DC
  Administered 2015-11-28 – 2015-12-01 (×7): 500 mg via INTRAVENOUS
  Filled 2015-11-27 (×8): qty 500

## 2015-11-27 MED ORDER — VANCOMYCIN HCL IN DEXTROSE 1-5 GM/200ML-% IV SOLN
1000.0000 mg | Freq: Once | INTRAVENOUS | Status: DC
  Filled 2015-11-27: qty 200

## 2015-11-27 MED ORDER — ALBUTEROL SULFATE (2.5 MG/3ML) 0.083% IN NEBU
5.0000 mg | INHALATION_SOLUTION | Freq: Once | RESPIRATORY_TRACT | Status: AC
  Administered 2015-11-27: 5 mg via RESPIRATORY_TRACT
  Filled 2015-11-27: qty 6

## 2015-11-27 MED ORDER — ARFORMOTEROL TARTRATE 15 MCG/2ML IN NEBU
15.0000 ug | INHALATION_SOLUTION | Freq: Two times a day (BID) | RESPIRATORY_TRACT | Status: DC
  Administered 2015-11-27 – 2015-12-04 (×13): 15 ug via RESPIRATORY_TRACT
  Filled 2015-11-27 (×16): qty 2

## 2015-11-27 MED ORDER — METHYLPREDNISOLONE SODIUM SUCC 125 MG IJ SOLR: 125.0000 mg | Freq: Once | INTRAMUSCULAR | Status: DC

## 2015-11-27 MED ORDER — METHYLPREDNISOLONE SODIUM SUCC 125 MG IJ SOLR
60.0000 mg | Freq: Every day | INTRAMUSCULAR | Status: DC
  Administered 2015-11-28 – 2015-11-30 (×3): 60 mg via INTRAVENOUS
  Filled 2015-11-27 (×4): qty 2

## 2015-11-27 MED ORDER — DEXTROSE-NACL 5-0.45 % IV SOLN
INTRAVENOUS | Status: DC
  Administered 2015-11-27: 50 mL/h via INTRAVENOUS
  Administered 2015-11-28 (×2): via INTRAVENOUS

## 2015-11-27 MED ORDER — SODIUM CHLORIDE 0.9 % IV BOLUS (SEPSIS)
1000.0000 mL | INTRAVENOUS | Status: DC
  Administered 2015-11-27: 1000 mL via INTRAVENOUS

## 2015-11-27 MED ORDER — DEXTROSE 5 % IV SOLN
2.0000 g | Freq: Two times a day (BID) | INTRAVENOUS | Status: DC
  Administered 2015-11-28 – 2015-12-01 (×8): 2 g via INTRAVENOUS
  Filled 2015-11-27 (×8): qty 2

## 2015-11-27 MED ORDER — BUDESONIDE 0.5 MG/2ML IN SUSP
0.5000 mg | Freq: Two times a day (BID) | RESPIRATORY_TRACT | Status: DC
  Administered 2015-11-27 – 2015-12-04 (×14): 0.5 mg via RESPIRATORY_TRACT
  Filled 2015-11-27 (×15): qty 2

## 2015-11-27 MED ORDER — IPRATROPIUM-ALBUTEROL 0.5-2.5 (3) MG/3ML IN SOLN
3.0000 mL | Freq: Four times a day (QID) | RESPIRATORY_TRACT | Status: DC
  Administered 2015-11-27 – 2015-11-30 (×11): 3 mL via RESPIRATORY_TRACT
  Filled 2015-11-27 (×11): qty 3

## 2015-11-27 MED ORDER — LEVOTHYROXINE SODIUM 100 MCG IV SOLR
12.5000 ug | Freq: Every day | INTRAVENOUS | Status: DC
  Administered 2015-11-28 – 2015-11-30 (×3): 12.5 ug via INTRAVENOUS
  Filled 2015-11-27 (×3): qty 5

## 2015-11-27 MED ORDER — VANCOMYCIN HCL 10 G IV SOLR
1250.0000 mg | Freq: Once | INTRAVENOUS | Status: AC
  Administered 2015-11-27: 1250 mg via INTRAVENOUS
  Filled 2015-11-27: qty 1250

## 2015-11-27 MED ORDER — METRONIDAZOLE IN NACL 5-0.79 MG/ML-% IV SOLN
500.0000 mg | Freq: Three times a day (TID) | INTRAVENOUS | Status: DC
  Administered 2015-11-27 – 2015-12-01 (×12): 500 mg via INTRAVENOUS
  Filled 2015-11-27 (×11): qty 100

## 2015-11-27 MED ORDER — SODIUM CHLORIDE 0.9 % IV BOLUS (SEPSIS): 1000.0000 mL | Freq: Once | INTRAVENOUS | Status: DC

## 2015-11-27 NOTE — ED Notes (Signed)
To ED via [facility] from [facility]. Was diagnosed with pneumonia from aspiration yesterday-- started on antibiotics today.

## 2015-11-27 NOTE — ED Notes (Signed)
Daughter at bedside.

## 2015-11-27 NOTE — Progress Notes (Signed)
Patient ID: Todd [name], male   DOB: 25-Aug-1924, 82 y.o.   MRN: [id number]    DATE:11/27/15  Location:  [facility]    Place of Service: SNF (31)   Extended Emergency Contact Information Primary Emergency Contact: [place] Phone: [phone] Relation: Daughter Secondary Emergency Contact: [place] Mobile Phone: [phone] Relation: Grandson  Advanced Directive information  FULL CODE  Chief Complaint  Patient presents with  . Medical Management of Chronic Issues    difficulty breathing    HPI:  82 yo male seen today for f/u daughter initally wanted to meet to discuss pt going home with 24 hr supervision. He has a hx COPD and code status recently changed from DNR to Full by family. However, pt now in acute respiratory distress and she would like him sent out to hospital for further eval. She does not want mechanical ventilation but is open to BiPAP. He received neb tx about 1 hr ago and continues to struggle to breathe. Occasional cough. Low grade temp with Tm 99.2. He had a CXR yesterday that revealed persistent right parahilar opacity that was unchanged . No acute process. He was started on augmentin po along mucinex and nebs. He has not taken abx today as he cannot swllow due to respiratory distress. Palliative care has been following pt in SNF  Dysphagia - followed by ST  COPD/asthma - on advair, nebs  Afib/CHF - usually rate controlled without rate controlling med. He takes xeralto for anticoagulation. He takes losartan.   Dementia/depression - stable on effexor. He has nutritional supplements  Past Medical History  Diagnosis Date  . Asthma   . Hypertension   . A-fib ([place])   . Hyperlipidemia   . Shortness of breath   . CHF (congestive heart failure) ([place]) 10/28/2011  . Forgetfulness   . PVC's (premature ventricular contractions)   . Mild aortic insufficiency   . Dementia    [name] 02/05/2015  . Failure to thrive in adult     /notes 02/05/2015  . COPD (chronic obstructive pulmonary disease) ([place])   . Hypothyroidism     [name] 02/05/2015  . Fall 02/04/2015    with altered mental status off of his baseline after he suffered a mechanical fall /notes 02/04/2015  . CAP (community acquired pneumonia)     [name] 02/05/2015  . Colon cancer [facility])     Past Surgical History  Procedure Laterality Date  . Colon surgery      Partial hemecolectomy   . Central line insertion  09/08/2011         Patient Care Team: Susy Frizzle, MD as PCP - General (Family Medicine)  Social History   Social History  . Marital Status: Widowed    Spouse Name: N/A  . Number of Children: 2  . Years of Education: 12   Occupational History  . 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 Smokeless tobacco: Not on file     Comment: quit 30+ yrs ago  . Alcohol Use: No  . Drug Use: No  . Sexual Activity: No   Other Topics Concern  . Not on file   Social History Narrative   [place] man. 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 Widowed 2015. Had a son who  died at 42. Has a daughter, 2 grandchildren, 2 great-grandchildren. Reports that he was living with his daughter     reports that he has quit smoking. He does not have any smokeless tobacco history on file. He reports that he does not drink alcohol or use illicit drugs.  Immunization History  Administered Date(s) Administered  . Influenza Whole 07/02/2009, 09/10/2011  . Influenza,inj,Quad PF,36+ Mos 08/28/2013, 09/05/2014  . Pneumococcal Conjugate-13 09/05/2014  . Pneumococcal Polysaccharide-23 11/01/2006  . Zoster 08/26/2006    No Known Allergies  Medications: Patient's Medications  New Prescriptions   No medications on file  Previous Medications   ACETAMINOPHEN (TYLENOL) 325 MG TABLET     Take 650 mg by mouth every 6 (six) hours as needed for mild pain.   ADVAIR DISKUS 500-50 MCG/DOSE AEPB    INHALE 1 PUFF TWICE A DAY   AMOXICILLIN-CLAVULANATE (AUGMENTIN) 875-125 MG TABLET    Take 1 tablet by mouth 2 (two) times daily.   ARTIFICIAL TEAR SOLUTION OP    Apply to eye. Instill 2 drops into both eyes four times daily as needed   FEEDING SUPPLEMENT, ENSURE ENLIVE, (ENSURE ENLIVE) LIQD    Take 237 mLs by mouth 2 (two) times daily between meals.   FERROUS SULFATE 325 (65 FE) MG TABLET    TAKE 1 TABLET BY MOUTH EVERY DAY   FLUTICASONE (FLONASE) 50 MCG/ACT NASAL SPRAY    Place 2 sprays into both nostrils daily.   GUAIFENESIN 200 MG TABLET    Take 200 mg by mouth every 4 (four) hours as needed for cough or to loosen phlegm.   IPRATROPIUM-ALBUTEROL (DUONEB) 0.5-2.5 (3) MG/3ML SOLN    Take 3 mLs by nebulization. Every 4-6 hours as needed for SOB/wheezing   LEVALBUTEROL (XOPENEX) 0.63 MG/3ML NEBULIZER SOLUTION    Take 3 mLs (0.63 mg total) by nebulization every 4 (four) hours as needed for wheezing or shortness of breath.   LEVOTHYROXINE (SYNTHROID, LEVOTHROID) 25 MCG TABLET    TAKE 1 TABLET BY MOUTH EVERY DAY   LOSARTAN (COZAAR) 50 MG TABLET    Take 50 mg by mouth daily.   OMEPRAZOLE (PRILOSEC) 40 MG CAPSULE    Take 40 mg by mouth daily.   POTASSIUM CHLORIDE SA (K-DUR,KLOR-CON) 20 MEQ TABLET    Take 40 mEq by mouth daily.   PRAVASTATIN (PRAVACHOL) 20 MG TABLET    Take 20 mg by mouth daily.   RIVAROXABAN (XARELTO) 20 MG TABS TABLET    Take 1 tablet (20 mg total) by mouth daily with supper.   TAMSULOSIN (FLOMAX) 0.4 MG CAPS CAPSULE    Take 1 capsule (0.4 mg total) by mouth daily.   VENLAFAXINE XR (EFFEXOR XR) 37.5 MG 24 HR CAPSULE    Take 2 capsules (75 mg total) by mouth daily with breakfast. 2 pills in AM  Modified Medications   No medications on file  Discontinued Medications   No medications on file    Review of Systems  Unable to perform ROS: Severe respiratory distress    Filed  Vitals:   11/27/15 1124  BP: 127/52  Pulse: 67  Temp: 98.9 F (37.2 C)  Weight: 133 lb 6.4 oz (60.51 kg)  SpO2: 95%   Body mass index is 18.09 kg/(m^2).  Physical Exam  Constitutional: He appears well-developed. He appears cachectic. He appears toxic. He appears distressed. Nasal cannula in place.  Frail appearing with marked respiratory distress. [place] O2 intact  HENT:  MM dry  Eyes: Pupils are equal, round, and  reactive to light. No scleral icterus.  Neck: Trachea normal. Neck supple. No thyromegaly present.  Accessory muscle use  Cardiovascular: Intact distal pulses.  An irregular rhythm present. Tachycardia present.  Exam reveals no gallop and no friction rub.   Murmur (1/6 SEM) heard. No LE edema b/l. No calf TTP  Pulmonary/Chest: He is in respiratory distress. He has wheezes. He has rales. He exhibits no tenderness.  Abdominal: Soft. Bowel sounds are normal. He exhibits no distension, no abdominal bruit, no pulsatile midline mass and no mass. There is no tenderness. There is no rebound and no guarding.  Lymphadenopathy:    He has no cervical adenopathy.  Neurological: He is alert.  Skin: Skin is warm and dry. No rash noted.  Psychiatric: He has a normal mood and affect. His behavior is normal. His speech is rapid and/or pressured. He is noncommunicative.     Labs reviewed: Admission on 10/31/2015, Discharged on 11/04/2015  Component Date Value Ref Range Status  . Lactic Acid, Venous 10/31/2015 1.38  0.5 - 2.0 mmol/L Final  . Sodium 10/31/2015 141  135 - 145 mmol/L Final  . Potassium 10/31/2015 3.7  3.5 - 5.1 mmol/L Final  . Chloride 10/31/2015 112* 101 - 111 mmol/L Final  . CO2 10/31/2015 21* 22 - 32 mmol/L Final  . Glucose, Bld 10/31/2015 99  65 - 99 mg/dL Final  . BUN 19/18/8755 12  6 - 20 mg/dL Final  . Creatinine, Ser 10/31/2015 0.72  0.61 - 1.24 mg/dL Final  . Calcium 67/61/5918 7.3* 8.9 - 10.3 mg/dL Final  . Total Protein 10/31/2015 5.1* 6.5 - 8.1 g/dL Final  .  Albumin 47/49/9041 2.5* 3.5 - 5.0 g/dL Final  . AST 36/58/3841 15  15 - 41 U/L Final  . ALT 10/31/2015 11* 17 - 63 U/L Final  . Alkaline Phosphatase 10/31/2015 72  38 - 126 U/L Final  . Total Bilirubin 10/31/2015 0.5  0.3 - 1.2 mg/dL Final  . GFR calc non Af Amer 10/31/2015 >60  >60 mL/min Final  . GFR calc Af Amer 10/31/2015 >60  >60 mL/min Final   Comment: (NOTE) The eGFR has been calculated using the CKD EPI equation. This calculation has not been validated in all clinical situations. eGFR's persistently <60 mL/min signify possible Chronic Kidney Disease.   . Anion gap 10/31/2015 8  5 - 15 Final  . WBC 10/31/2015 6.4  4.0 - 10.5 K/uL Final  . RBC 10/31/2015 2.87* 4.22 - 5.81 MIL/uL Final  . Hemoglobin 10/31/2015 8.8* 13.0 - 17.0 g/dL Final  . HCT 23/07/1418 26.3* 39.0 - 52.0 % Final  . MCV 10/31/2015 91.6  78.0 - 100.0 fL Final  . MCH 10/31/2015 30.7  26.0 - 34.0 pg Final  . MCHC 10/31/2015 33.5  30.0 - 36.0 g/dL Final  . RDW 83/36/3718 14.9  11.5 - 15.5 % Final  . Platelets 10/31/2015 213  150 - 400 K/uL Final  . Neutrophils Relative % 10/31/2015 60   Final  . Neutro Abs 10/31/2015 3.9  1.7 - 7.7 K/uL Final  . Lymphocytes Relative 10/31/2015 19   Final  . Lymphs Abs 10/31/2015 1.2  0.7 - 4.0 K/uL Final  . Monocytes Relative 10/31/2015 18   Final  . Monocytes Absolute 10/31/2015 1.2* 0.1 - 1.0 K/uL Final  . Eosinophils Relative 10/31/2015 2   Final  . Eosinophils Absolute 10/31/2015 0.1  0.0 - 0.7 K/uL Final  . Basophils Relative 10/31/2015 1   Final  . Basophils Absolute 10/31/2015 0.0  0.0 - 0.1 K/uL Final  . Specimen Description 10/31/2015 BLOOD RIGHT FOREARM   Final  . Special Requests 10/31/2015 BOTTLES DRAWN AEROBIC AND ANAEROBIC 5CC   Final  . Culture 10/31/2015 NO GROWTH 5 DAYS   Final  . Report Status 10/31/2015 11/05/2015 FINAL   Final  . Specimen Description 10/31/2015 BLOOD LEFT FOREARM   Final  . Special Requests 10/31/2015 BOTTLES DRAWN AEROBIC AND ANAEROBIC 5CC    Final  . Culture 10/31/2015 NO GROWTH 5 DAYS   Final  . Report Status 10/31/2015 11/05/2015 FINAL   Final  . Color, Urine 10/31/2015 YELLOW  YELLOW Final  . APPearance 10/31/2015 CLEAR  CLEAR Final  . Specific Gravity, Urine 10/31/2015 1.014  1.005 - 1.030 Final  . pH 10/31/2015 6.5  5.0 - 8.0 Final  . Glucose, UA 10/31/2015 NEGATIVE  NEGATIVE mg/dL Final  . Hgb urine dipstick 10/31/2015 NEGATIVE  NEGATIVE Final  . Bilirubin Urine 10/31/2015 NEGATIVE  NEGATIVE Final  . Ketones, ur 10/31/2015 NEGATIVE  NEGATIVE mg/dL Final  . Protein, ur 10/31/2015 NEGATIVE  NEGATIVE mg/dL Final  . Nitrite 10/31/2015 NEGATIVE  NEGATIVE Final  . Leukocytes, UA 10/31/2015 NEGATIVE  NEGATIVE Final   MICROSCOPIC NOT DONE ON URINES WITH NEGATIVE PROTEIN, BLOOD, LEUKOCYTES, NITRITE, OR GLUCOSE <1000 mg/dL.  Mar[place] Kitchen Specimen Description 10/31/2015 URINE, CATHETERIZED   Final  . Special Requests 10/31/2015 NONE   Final  . Culture 10/31/2015 NO GROWTH 1 DAY   Final  . Report Status 10/31/2015 11/01/2015 FINAL   Final  . B Natriuretic Peptide 10/31/2015 382.1* 0.0 - 100.0 pg/mL Final  . Sodium 11/01/2015 142  135 - 145 mmol/L Final  . Potassium 11/01/2015 4.4  3.5 - 5.1 mmol/L Final  . Chloride 11/01/2015 109  101 - 111 mmol/L Final  . CO2 11/01/2015 22  22 - 32 mmol/L Final  . Glucose, Bld 11/01/2015 152* 65 - 99 mg/dL Final  . BUN 11/01/2015 18  6 - 20 mg/dL Final  . Creatinine, Ser 11/01/2015 0.91  0.61 - 1.24 mg/dL Final  . Calcium 11/01/2015 8.9  8.9 - 10.3 mg/dL Final  . GFR calc non Af Amer 11/01/2015 >60  >60 mL/min Final  . GFR calc Af Amer 11/01/2015 >60  >60 mL/min Final   Comment: (NOTE) The eGFR has been calculated using the CKD EPI equation. This calculation has not been validated in all clinical situations. eGFR's persistently <60 mL/min signify possible Chronic Kidney Disease.   . Anion gap 11/01/2015 11  5 - 15 Final  . WBC 11/01/2015 4.6  4.0 - 10.5 K/uL Final  . RBC 11/01/2015 3.66* 4.22 -  5.81 MIL/uL Final  . Hemoglobin 11/01/2015 10.7* 13.0 - 17.0 g/dL Final  . HCT 11/01/2015 33.1* 39.0 - 52.0 % Final  . MCV 11/01/2015 90.4  78.0 - 100.0 fL Final  . MCH 11/01/2015 29.2  26.0 - 34.0 pg Final  . MCHC 11/01/2015 32.3  30.0 - 36.0 g/dL Final  . RDW 11/01/2015 14.8  11.5 - 15.5 % Final  . Platelets 11/01/2015 264  150 - 400 K/uL Final  . MRSA by PCR 10/31/2015 POSITIVE* NEGATIVE Final   Comment:        The GeneXpert MRSA Assay (FDA approved for NASAL specimens only), is one component of a comprehensive MRSA colonization surveillance program. It is not intended to diagnose MRSA infection nor to guide or monitor treatment for MRSA infections. RESULT CALLED TO, READ BACK BY AND VERIFIED WITH: ANTELLONE,J.,RN AT 2007 BY L.PITT 10/31/15   .  WBC 11/02/2015 7.8  4.0 - 10.5 K/uL Final  . RBC 11/02/2015 3.34* 4.22 - 5.81 MIL/uL Final  . Hemoglobin 11/02/2015 10.1* 13.0 - 17.0 g/dL Final  . HCT 11/02/2015 30.3* 39.0 - 52.0 % Final  . MCV 11/02/2015 90.7  78.0 - 100.0 fL Final  . MCH 11/02/2015 30.2  26.0 - 34.0 pg Final  . MCHC 11/02/2015 33.3  30.0 - 36.0 g/dL Final  . RDW 11/02/2015 14.8  11.5 - 15.5 % Final  . Platelets 11/02/2015 241  150 - 400 K/uL Final  . Sodium 11/02/2015 143  135 - 145 mmol/L Final  . Potassium 11/02/2015 3.8  3.5 - 5.1 mmol/L Final  . Chloride 11/02/2015 111  101 - 111 mmol/L Final  . CO2 11/02/2015 23  22 - 32 mmol/L Final  . Glucose, Bld 11/02/2015 157* 65 - 99 mg/dL Final  . BUN 11/02/2015 28* 6 - 20 mg/dL Final  . Creatinine, Ser 11/02/2015 1.08  0.61 - 1.24 mg/dL Final  . Calcium 11/02/2015 8.8* 8.9 - 10.3 mg/dL Final  . GFR calc non Af Amer 11/02/2015 59* >60 mL/min Final  . GFR calc Af Amer 11/02/2015 >60  >60 mL/min Final   Comment: (NOTE) The eGFR has been calculated using the CKD EPI equation. This calculation has not been validated in all clinical situations. eGFR's persistently <60 mL/min signify possible Chronic Kidney Disease.     . Anion gap 11/02/2015 9  5 - 15 Final  . WBC 11/03/2015 8.5  4.0 - 10.5 K/uL Final  . RBC 11/03/2015 3.41* 4.22 - 5.81 MIL/uL Final  . Hemoglobin 11/03/2015 10.1* 13.0 - 17.0 g/dL Final  . HCT 11/03/2015 30.5* 39.0 - 52.0 % Final  . MCV 11/03/2015 89.4  78.0 - 100.0 fL Final  . MCH 11/03/2015 29.6  26.0 - 34.0 pg Final  . MCHC 11/03/2015 33.1  30.0 - 36.0 g/dL Final  . RDW 11/03/2015 14.9  11.5 - 15.5 % Final  . Platelets 11/03/2015 268  150 - 400 K/uL Final  . Sodium 11/03/2015 144  135 - 145 mmol/L Final  . Potassium 11/03/2015 3.3* 3.5 - 5.1 mmol/L Final  . Chloride 11/03/2015 108  101 - 111 mmol/L Final  . CO2 11/03/2015 26  22 - 32 mmol/L Final  . Glucose, Bld 11/03/2015 118* 65 - 99 mg/dL Final  . BUN 11/03/2015 27* 6 - 20 mg/dL Final  . Creatinine, Ser 11/03/2015 0.95  0.61 - 1.24 mg/dL Final  . Calcium 11/03/2015 8.7* 8.9 - 10.3 mg/dL Final  . GFR calc non Af Amer 11/03/2015 >60  >60 mL/min Final  . GFR calc Af Amer 11/03/2015 >60  >60 mL/min Final   Comment: (NOTE) The eGFR has been calculated using the CKD EPI equation. This calculation has not been validated in all clinical situations. eGFR's persistently <60 mL/min signify possible Chronic Kidney Disease.   . Anion gap 11/03/2015 10  5 - 15 Final  Nursing Home on 10/17/2015  Component Date Value Ref Range Status  . TSH 09/15/2015 5.00  .41 - 5.90 uIU/mL Final  Nursing Home on 09/12/2015  Component Date Value Ref Range Status  . Hemoglobin 08/18/2015 10.8* 13.5 - 17.5 g/dL Final  . Platelets 08/18/2015 259  150 - 399 K/L Final  . WBC 08/18/2015 7.6   Final  . Glucose 08/18/2015 82   Final  . BUN 08/18/2015 20  4 - 21 mg/dL Final  . Creatinine 08/18/2015 1.0  0.6 - 1.3 mg/dL Final  .  Potassium 08/18/2015 4.3  3.4 - 5.3 mmol/L Final  . Sodium 08/18/2015 140  137 - 147 mmol/L Final    Dg Chest Port 1 View  10/31/2015  CLINICAL DATA:  Sepsis EXAM: PORTABLE CHEST - 1 VIEW COMPARISON:  04/14/2015 FINDINGS: Cardiac  shadow is mildly prominent but stable. Aortic calcifications are again seen. Mild apical scarring is noted and stable. Mild chronic interstitial changes are seen. No focal infiltrate or sizable effusion is noted. IMPRESSION: Chronic changes without acute abnormality. Electronically Signed   By: Inez Catalina M.D.   On: 10/31/2015 13:29   Dg Swallowing Func-speech Pathology  11/03/2015  Objective Swallowing Evaluation:   MBS Patient Details Name: Todd [name] MRN: [id number] Date of Birth: [birth year] Today's Date: 11/03/2015 Time: SLP Start Time (ACUTE ONLY): 0930-SLP Stop Time (ACUTE ONLY): 0942 SLP Time Calculation (min) (ACUTE ONLY): 12 min Past Medical History: '@PMH'$ @ Past Surgical History: Past Surgical History Procedure Laterality Date . Colon surgery     Partial hemecolectomy  . Central line insertion  09/08/2011     HPI: ALVAN CULPEPPER is a 82 y.o. male has a past medical history significant for COPD, dementia, PNA (01/2015), Asthma, chronic A fib, HLD, diastolic CHF, brought from his SNF due to AMS and fever. Admitted for presumed sepsis due to aspiration PNA. Per notes, patient with episodes of vomiting, then became lethargic and febrile. Patient with h/o dysphagia requiring thickened liquids. Most recent MBS 03/10/2015 recommended dysphagia 3, thin liquids with emphasis on limiting bolus size for prevention of aspiration.  No Data Recorded Assessment / Plan / Recommendation CHL IP CLINICAL IMPRESSIONS 11/03/2015 Therapy Diagnosis Severe pharyngeal phase dysphagia;Moderate pharyngeal phase dysphagia Clinical Impression Patient presents with a moderate-severe dysphagia, primarily pharyngeal in nature with both sensory and motor impairments. Patient with base of tongue, laryngeal, and pharyngeal weakness resulting with moderate-severe residuals with thicker consistencies and decreased airway protection both during and after the swallow. Unfortunately, thinner consistencies, which result in less residue, cause a  delay in swallow initiation and penetration/aspiration before and during the swallow. No consistencies will fully prevent aspiration at this time. Cognitive status does not allow for effective carryout of compensatory strategies to either prevent or clear penetrates/aspirates. Will continue current diet at this time and discuss with POA.  Impact on safety and function Moderate aspiration risk;Severe aspiration risk   CHL IP TREATMENT RECOMMENDATION 11/03/2015 Treatment Recommendations Therapy as outlined in treatment plan below   Prognosis 11/03/2015 Prognosis for Safe Diet Advancement Guarded Barriers to Reach Goals Time post onset;Severity of deficits Barriers/Prognosis Comment -- CHL IP DIET RECOMMENDATION 11/03/2015 SLP Diet Recommendations Dysphagia 3 (Mech soft) solids;Honey thick liquids Liquid Administration via Spoon Medication Administration Crushed with puree Compensations Slow rate;Small sips/bites;Multiple dry swallows after each bite/sip Postural Changes Remain semi-upright after after feeds/meals (Comment)   CHL IP OTHER RECOMMENDATIONS 11/03/2015 Recommended Consults -- Oral Care Recommendations Oral care BID Other Recommendations Order thickener from pharmacy;Prohibited food (jello, ice cream, thin soups);Remove water pitcher   CHL IP FOLLOW UP RECOMMENDATIONS 11/03/2015 Follow up Recommendations Skilled Nursing facility   [facility] IP FREQUENCY AND DURATION 11/03/2015 Speech Therapy Frequency (ACUTE ONLY) min 2x/week Treatment Duration 2 weeks      CHL IP ORAL PHASE 11/03/2015 Oral Phase WFL Oral - Pudding Teaspoon -- Oral - Pudding Cup -- Oral - Honey Teaspoon -- Oral - Honey Cup -- Oral - Nectar Teaspoon -- Oral - Nectar Cup -- Oral - Nectar Straw -- Oral - Thin Teaspoon -- Oral - Thin Cup --  Oral - Thin Straw -- Oral - Puree -- Oral - Mech Soft -- Oral - Regular -- Oral - Multi-Consistency -- Oral - Pill -- Oral Phase - Comment --  CHL IP PHARYNGEAL PHASE 11/03/2015 Pharyngeal Phase Impaired Pharyngeal- Pudding  Teaspoon -- Pharyngeal -- Pharyngeal- Pudding Cup -- Pharyngeal -- Pharyngeal- Honey Teaspoon Delayed swallow initiation-pyriform sinuses;Penetration/Aspiration during swallow;Pharyngeal residue - valleculae;Pharyngeal residue - pyriform;Reduced airway/laryngeal closure;Reduced anterior laryngeal mobility;Reduced laryngeal elevation;Reduced tongue base retraction;Lateral channel residue Pharyngeal Material enters airway, CONTACTS cords and not ejected out Pharyngeal- Honey Cup -- Pharyngeal -- Pharyngeal- Nectar Teaspoon Delayed swallow initiation-pyriform sinuses;Penetration/Aspiration during swallow;Pharyngeal residue - valleculae;Pharyngeal residue - pyriform;Reduced airway/laryngeal closure;Reduced anterior laryngeal mobility;Reduced laryngeal elevation;Reduced tongue base retraction;Lateral channel residue Pharyngeal Material enters airway, CONTACTS cords and not ejected out Pharyngeal- Nectar Cup Delayed swallow initiation-pyriform sinuses;Penetration/Aspiration during swallow;Pharyngeal residue - valleculae;Pharyngeal residue - pyriform;Reduced airway/laryngeal closure;Reduced anterior laryngeal mobility;Reduced laryngeal elevation;Reduced tongue base retraction;Lateral channel residue Pharyngeal Material enters airway, CONTACTS cords and not ejected out;Material enters airway, passes BELOW cords without attempt by patient to eject out (silent aspiration) Pharyngeal- Nectar Straw -- Pharyngeal -- Pharyngeal- Thin Teaspoon Delayed swallow initiation-pyriform sinuses;Penetration/Aspiration during swallow;Pharyngeal residue - valleculae;Pharyngeal residue - pyriform;Reduced airway/laryngeal closure;Reduced anterior laryngeal mobility;Reduced laryngeal elevation;Reduced tongue base retraction;Lateral channel residue Pharyngeal Material enters airway, CONTACTS cords and not ejected out;Material enters airway, passes BELOW cords without attempt by patient to eject out (silent aspiration) Pharyngeal- Thin Cup --  Pharyngeal -- Pharyngeal- Thin Straw Delayed swallow initiation-pyriform sinuses;Penetration/Aspiration during swallow;Pharyngeal residue - valleculae;Pharyngeal residue - pyriform;Reduced airway/laryngeal closure;Reduced anterior laryngeal mobility;Reduced laryngeal elevation;Reduced tongue base retraction;Lateral channel residue Pharyngeal Material enters airway, passes BELOW cords without attempt by patient to eject out (silent aspiration);Material enters airway, CONTACTS cords and not ejected out Pharyngeal- Puree Pharyngeal residue - valleculae;Pharyngeal residue - pyriform;Reduced airway/laryngeal closure;Reduced anterior laryngeal mobility;Reduced laryngeal elevation;Reduced tongue base retraction;Delayed swallow initiation-vallecula Pharyngeal Material does not enter airway Pharyngeal- Mechanical Soft Pharyngeal residue - valleculae;Pharyngeal residue - pyriform;Reduced airway/laryngeal closure;Reduced anterior laryngeal mobility;Reduced laryngeal elevation;Reduced tongue base retraction;Lateral channel residue;Delayed swallow initiation-vallecula Pharyngeal Material does not enter airway Pharyngeal- Regular -- Pharyngeal -- Pharyngeal- Multi-consistency -- Pharyngeal -- Pharyngeal- Pill -- Pharyngeal -- Pharyngeal Comment --  CHL IP CERVICAL ESOPHAGEAL PHASE 11/03/2015 Cervical Esophageal Phase WFL Pudding Teaspoon -- Pudding Cup -- Honey Teaspoon -- Honey Cup -- Nectar Teaspoon -- Nectar Cup -- Nectar Straw -- Thin Teaspoon -- Thin Cup -- Thin Straw -- Puree -- Mechanical Soft -- Regular -- Multi-consistency -- Pill -- Cervical Esophageal Comment -- Gabriel Rainwater MA, CCC-SLP [phone] [place] Leah Meryl 11/03/2015, 10:29 AM                Assessment/Plan   ICD-9-CM ICD-10-CM   1. Acute respiratory distress (HCC) - possible aspiration vs HCAP 518.82 R06.00   2. Chronic obstructive pulmonary disease, unspecified COPD type ([place]) 496 J44.9   3. Chronic atrial fibrillation (HCC) 427.31 I48.2   4. Chronic  diastolic CHF (congestive heart failure) (HCC) 428.32 I50.32    428.0    5. Dysphagia 787.20 R13.10     Sent to ED via EMS for respiratory distress. Daughter wishes to keep him FULL CODE at this time but does not want intubation  Delana Manganello S. Perlie Gold  [facility] [address] [place], [place] [address] [phone] Cell (Monday-Friday 8 AM - 5 PM) [phone] After 5 PM and follow prompts

## 2015-11-27 NOTE — ED Notes (Signed)
Assisted Santiago Glad, RN with performing a rectal temperature on patient

## 2015-11-27 NOTE — H&P (Signed)
[facility] History and Physical  [name] [id number] DOB: 02-11-1924 DOA: 11/27/2015  Referring physician:  Sherwood [name] PCP:  Todd Fraction, MD   Chief Complaint:  Lethargy, SOB  HPI:  The patient is a 82 y.o. year-old male with history of chronic diastolic heart failure, chronic atrial fibrillation, COPD, moderate malnutrition, hypothyroidism who was recently admitted with sepsis secondary to suspected aspiration pneumonia from 10/31/2015 to 11/04/2015 who presents with shortness of breath.  He was discharged to SNF on Augmentin and had been feeling somewhat better.  Yesterday, he had a cough and was diagnosed with pneumonia and was restarted on Augmentin.  Today, he was sleep and seemed to have difficulty breathing.  He was transferred to the emergency department for further evaluation and treatment.    In the emergency department, vital signs notable for mild hypothermia to 96.8 Fahrenheit, pulse highly variable between 38 and 90, respirations 28-30 with respiratory distress, blood pressure stable, initial O2 sat 81% with blood gas of pH 7.122/PCO2 79/PO2 322. Labs were notable for white blood cell count of 11.1 and mild anemia, lactic acid of 5.37. Chest x-ray was concerning for right upper lobe pneumonia.  In the emergency department, he was started on sepsis protocol. He received Solu-Medrol 125 g IV once, 2 L of IV fluids, vancomycin, cefepime, DuoNeb and albuterol. He was started on BiPAP and had a slight improvement in his mentation. He is DO NOT RESUSCITATE/DO NOT INTUBATE in the emergency department physician spoke with the daughter he wanted supportive care with BiPAP and antibiotics for now but no escalation of care.  Review of Systems:  General:  Denies fevers, chills, weight loss or gain HEENT:  Denies changes to hearing and vision, rhinorrhea, sinus congestion, sore throat CV:  Denies chest pain and palpitations, lower extremity edema.  PULM:  Denies SOB,  wheezing, cough.   GI:  Denies nausea, vomiting, constipation, diarrhea.   GU:  Denies dysuria, frequency, urgency [name]:  Denies polyuria, polydipsia.   HEME:  Denies hematemesis, blood in stools, melena, abnormal bruising or bleeding.  LYMPH:  Denies lymphadenopathy.   MSK:  Denies arthralgias, myalgias.   DERM:  Denies skin rash or ulcer.   NEURO:  Denies focal numbness, weakness, slurred speech, confusion, facial droop.  PSYCH:  Denies anxiety and depression.    Past Medical History  Diagnosis Date  . Asthma   . Hypertension   . A-fib ([place])   . Hyperlipidemia   . Shortness of breath   . CHF (congestive heart failure) ([place]) 10/28/2011  . Forgetfulness   . PVC's (premature ventricular contractions)   . Mild aortic insufficiency   . Dementia     Todd [name] 02/05/2015  . Failure to thrive in adult     /notes 02/05/2015  . COPD (chronic obstructive pulmonary disease) ([place])   . Hypothyroidism     Todd [name] 02/05/2015  . Fall 02/04/2015    with altered mental status off of his baseline after he suffered a mechanical fall /notes 02/04/2015  . CAP (community acquired pneumonia)     Todd [name] 02/05/2015  . Colon cancer [facility])    Past Surgical History  Procedure Laterality Date  . Colon surgery      Partial hemecolectomy   . Central line insertion  09/08/2011        Social History:  reports that he has quit smoking. He does not have any smokeless tobacco history on file. He reports that he does not drink alcohol or use illicit drugs.  No Known Allergies  Family History  Problem Relation Age of Onset  . Hyperlipidemia Father   . Hypertension Father   . Heart disease Father   . Asthma Father   . Stroke Father   . Heart failure Father   . Heart disease Mother   . Heart failure Mother   . Heart attack Sister      Prior to Admission medications   Medication Sig Start Date End Date Taking? Authorizing Provider  acetaminophen (TYLENOL) 325 MG tablet Take 650 mg by mouth every 6 (six) hours as  needed for mild pain.   Yes Historical Provider, MD  ADVAIR DISKUS 500-50 MCG/DOSE AEPB INHALE 1 PUFF TWICE A DAY 12/23/14  Yes Todd Frizzle, MD  amoxicillin-clavulanate (AUGMENTIN) 875-125 MG tablet Take 1 tablet by mouth 2 (two) times daily. 11/04/15  Yes Todd Karlyne Greenspan, MD  ARTIFICIAL TEAR SOLUTION OP Apply to eye. Instill 2 drops into both eyes four times daily as needed   Yes Historical Provider, MD  dextromethorphan-guaiFENesin (MUCINEX DM) 30-600 MG 12hr tablet Take 1 tablet by mouth 2 (two) times daily.   Yes Historical Provider, MD  feeding supplement, ENSURE ENLIVE, (ENSURE ENLIVE) LIQD Take 237 mLs by mouth 2 (two) times daily between meals. Patient taking differently: Take 237 mLs by mouth 3 (three) times daily between meals.  03/11/15  Yes Todd Girt, DO  ferrous sulfate 325 (65 FE) MG tablet TAKE 1 TABLET BY MOUTH EVERY DAY 12/23/14  Yes Todd Frizzle, MD  fluticasone [facility]) 50 MCG/ACT nasal spray Place 2 sprays into both nostrils daily.   Yes Historical Provider, MD  guaiFENesin 200 MG tablet Take 200 mg by mouth every 4 (four) hours as needed for cough or to loosen phlegm.   Yes Historical Provider, MD  ipratropium-albuterol (DUONEB) 0.5-2.5 (3) MG/3ML SOLN Take 3 mLs by nebulization. Every 4-6 hours as needed for SOB/wheezing   Yes Historical Provider, MD  levalbuterol (XOPENEX) 0.63 MG/3ML nebulizer solution Take 3 mLs (0.63 mg total) by nebulization every 4 (four) hours as needed for wheezing or shortness of breath. 03/11/15  Yes Todd Girt, DO  levothyroxine (SYNTHROID, LEVOTHROID) 25 MCG tablet TAKE 1 TABLET BY MOUTH EVERY DAY 03/27/15  Yes Todd Frizzle, MD  losartan (COZAAR) 50 MG tablet Take 50 mg by mouth daily. 01/23/15  Yes Historical Provider, MD  omeprazole (PRILOSEC) 40 MG capsule Take 40 mg by mouth daily.   Yes Historical Provider, MD  potassium chloride SA (K-DUR,KLOR-CON) 20 MEQ tablet Take 40 mEq by mouth daily. 10/30/15  Yes Historical Provider, MD   pravastatin (PRAVACHOL) 20 MG tablet Take 20 mg by mouth daily.   Yes Historical Provider, MD  rivaroxaban (XARELTO) 20 MG TABS tablet Take 1 tablet (20 mg total) by mouth daily with supper. 03/11/15  Yes Todd Girt, DO  tamsulosin (FLOMAX) 0.4 MG CAPS capsule Take 1 capsule (0.4 mg total) by mouth daily. 03/11/15  Yes Todd Girt, DO  venlafaxine XR (EFFEXOR XR) 37.5 MG 24 hr capsule Take 2 capsules (75 mg total) by mouth daily with breakfast. 2 pills in AM Patient taking differently: Take 75 mg by mouth daily with breakfast.  03/11/15  Yes Todd Girt, DO   Physical Exam: Filed Vitals:   11/27/15 1700 11/27/15 1735 11/27/15 1750 11/27/15 1755  BP: 141/82 146/52 153/78 128/82  Pulse: 87 84 93 83  Temp:  97.6 F (36.4 C)    TempSrc:  Oral    Resp:  34 40 33  SpO2: 98% 93% 96% 98%     General:  Adult male, SCM retractions and tachypnea on bipap mask  Eyes:  PERRL, anicteric, non-injected.  ENT:  Bipap mask in place  Neck:  Supple without TM or JVD.    Lymph:  No cervical, supraclavicular, or submandibular LAD.  Cardiovascular:  IRRR, normal S1, S2, without m/r/g.  2+ pulses, warm extremities  Respiratory:  Wheezing throughout, diminished at bilateral bases, no rhonchi or obvious focal rales  Abdomen:  NABS.  Soft, ND/NT.    Skin:  No rashes or focal lesions.  Musculoskeletal:  Normal bulk and tone.  No LE edema.  Psychiatric:  A & O x 4.  Appropriate affect.  Neurologic:  CN 3-12 intact. 4/5 strength.  Sensation intact.  Labs on Admission:  Basic Metabolic Panel:  Recent Labs Lab 11/27/15 1212 11/27/15 1227  NA 142 142  K 5.2* 4.9  CL 107 106  CO2 22  --   GLUCOSE 161* 157*  BUN 16 20  CREATININE 0.91 0.80  CALCIUM 9.6  --    Liver Function Tests:  Recent Labs Lab 11/27/15 1212  AST 20  ALT 13*  ALKPHOS 94  BILITOT 0.1*  PROT 7.3  ALBUMIN 3.5   No results for input(s): LIPASE, AMYLASE in the last 168 hours. No results for input(s):  AMMONIA in the last 168 hours. CBC:  Recent Labs Lab 11/27/15 1212 11/27/15 1227  WBC 11.1*  --   NEUTROABS 7.4  --   HGB 12.1* 13.9  HCT 37.6* 41.0  MCV 95.2  --   PLT 382  --    Cardiac Enzymes: No results for input(s): CKTOTAL, CKMB, CKMBINDEX, TROPONINI in the last 168 hours.  BNP (last 3 results)  Recent Labs  03/29/15 0422 10/31/15 1350 11/27/15 1212  BNP 327.5* 382.1* 479.5*    ProBNP (last 3 results) No results for input(s): PROBNP in the last 8760 hours.  CBG: No results for input(s): GLUCAP in the last 168 hours.  Radiological Exams on Admission: Dg Chest Port 1 View  11/27/2015  CLINICAL DATA:  Hypoxia.  Fever. EXAM: PORTABLE CHEST 1 VIEW COMPARISON:  10/31/2015. FINDINGS: Stable enlarged cardiac silhouette and tortuous, partially calcified thoracic aorta. Interval patchy opacity in the right upper lobe. Unremarkable bones. IMPRESSION: Right upper lobe pneumonia. Electronically Signed   By: Claudie Revering M.D.   On: 11/27/2015 12:38    EKG: Independently reviewed. A-fib, no ischemic changes, rate controlled, occasional PVC  Assessment/Plan Principal Problem:   HCAP (healthcare-associated pneumonia) Active Problems:   Acute respiratory failure with hypoxia (HCC)   Chronic diastolic CHF (congestive heart failure) (HCC)   Hypothyroidism   Protein-calorie malnutrition, severe (HCC)   COPD exacerbation (HCC)   COPD (chronic obstructive pulmonary disease) ([place])   Dysphagia   DNR (do not resuscitate)  ---  Acute hypoxic respiratory failure and sepsis with lactic acidosis due to HCAP versus aspiration pneumonia -  Sputum culture if able -  F/u blood cultures -  Vancomycin and cefepime plus Flagyl -  Continue BiPAP prn -  Flu PCR  -  NPO pending speech therapy assessment -  Continue to cycle troponins and lactic acid (if patient allows)  COPD with acute exacerbation,  -  start solumedrol daily  -  Start duonebs -  Antibiotics as above  Chronic  diastolic heart failure - patient appears euvolemic, no LEE -  Daily weights and strict I/O  Chronic atrial fibrillation - patient's CHA2DS2-VASc Score  for Stroke Risk is 3, hold Xarelto and all oral medications  Hypothyroidism - continue Synthroid IV for now  Severe protein caloric malnutrition, NPO pending improvement in breathing  Iron deficiency anemia, hold oral iron for now  Mild hyperglycemia, likely due to steroids and stress -  Consider starting SSI if persistent  Hyperkalemia resolved with IVF  Leukocytosis is only mild and may be related to dehydration or sepsis  Diet:  NPO pending SLP and improvement in respiratory status Access:  PIV IVF:  yes Proph:  lovenox  Code Status: DNR/DNI Family Communication: patient and daughter Disposition Plan: Admit to stepdown, prognosis is poor  Time spent: 60 min Janece Canterbury [facility] Pager [phone]  If 7PM-7AM, please contact night-coverage [email or web address] Password [email or web address] 11/27/2015, 6:10 PM

## 2015-11-27 NOTE — Progress Notes (Addendum)
ANTIBIOTIC CONSULT NOTE - INITIAL  Pharmacy Consult for vancomycin, cefepime Indication: rule out sepsis  No Known Allergies  Patient Measurements:   Adjusted Body Weight:   Vital Signs: Temp: 97.3 F (36.3 C) (01/26 1225) Temp Source: Rectal (01/26 1225) BP: 124/61 mmHg (01/26 1231) Pulse Rate: 69 (01/26 1231) Intake/Output from previous day:   Intake/Output from this shift:    Labs:  Recent Labs  11/27/15 1212 11/27/15 1227  WBC 11.1*  --   HGB 12.1* 13.9  PLT 382  --   CREATININE  --  0.80   Estimated Creatinine Clearance: 53.6 mL/min (by C-G formula based on Cr of 0.8). No results for input(s): VANCOTROUGH, VANCOPEAK, VANCORANDOM, GENTTROUGH, GENTPEAK, GENTRANDOM, TOBRATROUGH, TOBRAPEAK, TOBRARND, AMIKACINPEAK, AMIKACINTROU, AMIKACIN in the last 72 hours.   Medical History: Past Medical History  Diagnosis Date  . Asthma   . Hypertension   . A-fib ([place])   . Hyperlipidemia   . Shortness of breath   . CHF (congestive heart failure) ([place]) 10/28/2011  . Forgetfulness   . PVC's (premature ventricular contractions)   . Mild aortic insufficiency   . Dementia     [name] 02/05/2015  . Failure to thrive in adult     /notes 02/05/2015  . COPD (chronic obstructive pulmonary disease) ([place])   . Hypothyroidism     [name] 02/05/2015  . Fall 02/04/2015    with altered mental status off of his baseline after he suffered a mechanical fall /notes 02/04/2015  . CAP (community acquired pneumonia)     [name] 02/05/2015  . Colon cancer ([place])     Medications:   (Not in a hospital admission)   Assessment: 82 yo male presents from nursing home with pneumonia, pt was started on Augmentin yesterday. Renal fx is stable, afebrile, VSS, LA 5.3.  Goal of Therapy:  Vancomycin trough level 15-20 mcg/ml  Plan:  -Vancomcyin 1250 mg IV x1 then 500 mg IV q12h -Cefepime 2 g IV q12h -Monitor renal fx, cultures, VT as needed, duration of therapy   Harvel Quale 11/27/2015,12:46  PM    Pharmacy Code Sepsis Protocol  Time of code sepsis page: 1222 [x]  Antibiotics delivered at 1230 []  Antibiotics administered prior to code at (if checked, omit next 2 questions)  Were antibiotics ordered at the time of the code sepsis page? No Was it required to contact the physician? [x]  Physician not contacted []  Physician contacted to order antibiotics for code sepsis []  Physician contacted to recommend changing antibiotics  Pharmacy consulted for: vanc / zosyn  Anti-infectives    Start     Dose/Rate Route Frequency Ordered Stop   11/28/15 0200  vancomycin (VANCOCIN) 500 mg in sodium chloride 0.9 % 100 mL IVPB     500 mg 100 mL/hr over 60 Minutes Intravenous Every 12 hours 11/27/15 1305     11/28/15 0030  ceFEPIme (MAXIPIME) 2 g in dextrose 5 % 50 mL IVPB     2 g 100 mL/hr over 30 Minutes Intravenous Every 12 hours 11/27/15 1305     11/27/15 1245  vancomycin (VANCOCIN) 1,250 mg in sodium chloride 0.9 % 250 mL IVPB     1,250 mg 166.7 mL/hr over 90 Minutes Intravenous  Once 11/27/15 1229     11/27/15 1215  ceFEPIme (MAXIPIME) 2 g in dextrose 5 % 50 mL IVPB     2 g 100 mL/hr over 30 Minutes Intravenous  Once 11/27/15 1212 11/27/15 1302   11/27/15 1215  vancomycin (VANCOCIN) IVPB 1000 mg/200 mL  premix  Status:  Discontinued     1,000 mg 200 mL/hr over 60 Minutes Intravenous  Once 11/27/15 1212 11/27/15 1229        Nurse education provided: []  Minutes left to administer antibiotics to achieve 1 hour goal [x]  Correct order of antibiotic administration []  Antibiotic Y-site compatibilities     Raidyn Breiner, Jake Church, PharmD 11/27/2015, 1:07 PM

## 2015-11-27 NOTE — ED Provider Notes (Signed)
CSN: [id number]     Arrival date & time 11/27/15  1159 History   First MD Initiated Contact with Patient 11/27/15 1202     Chief Complaint  Patient presents with  . Code Sepsis  . Shortness of Breath     (Consider location/radiation/quality/duration/timing/severity/associated sxs/prior Treatment) HPI  82 year old male presents from his nursing facility with acute shortness of breath and lethargy. Had cough and congestion yesterday. Patient has a history of COPD, CHF, and atrial fibrillation. Per the daughter, patient is normally awake and alert and can converse normally. Has been diagnosed with mild dementia. Today, patient is much more lethargic and seems to be in more respiratory distress. Oxygen saturation to 81% for EMS, placed on a nonrebreather. Started on Augmentin for pneumonia yesterday. History of aspirations in the past.  Past Medical History  Diagnosis Date  . Asthma   . Hypertension   . A-fib ([place])   . Hyperlipidemia   . Shortness of breath   . CHF (congestive heart failure) ([place]) 10/28/2011  . Forgetfulness   . PVC's (premature ventricular contractions)   . Mild aortic insufficiency   . Dementia     [name] 02/05/2015  . Failure to thrive in adult     /notes 02/05/2015  . COPD (chronic obstructive pulmonary disease) ([place])   . Hypothyroidism     [name] 02/05/2015  . Fall 02/04/2015    with altered mental status off of his baseline after he suffered a mechanical fall /notes 02/04/2015  . CAP (community acquired pneumonia)     [name] 02/05/2015  . Colon cancer [facility])    Past Surgical History  Procedure Laterality Date  . Colon surgery      Partial hemecolectomy   . Central line insertion  09/08/2011        Family History  Problem Relation Age of Onset  . Hyperlipidemia Father   . Hypertension Father   . Heart disease Father   . Asthma Father   . Stroke Father   . Heart failure Father   . Heart disease Mother   . Heart failure Mother   . Heart attack Sister     Social History  Substance Use Topics  . 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 Smokeless tobacco: None     Comment: quit 30+ yrs ago  . Alcohol Use: No    Review of Systems  Unable to perform ROS: Mental status change      Allergies  Review of patient's allergies indicates no known allergies.  Home Medications   Prior to Admission medications   Medication Sig Start Date End Date Taking? Authorizing Provider  acetaminophen (TYLENOL) 325 MG tablet Take 650 mg by mouth every 6 (six) hours as needed for mild pain.    Historical Provider, MD  ADVAIR DISKUS 500-50 MCG/DOSE AEPB INHALE 1 PUFF TWICE A DAY 12/23/14   Susy Frizzle, MD  amoxicillin-clavulanate (AUGMENTIN) 875-125 MG tablet Take 1 tablet by mouth 2 (two) times daily. 11/04/15   Costin Karlyne Greenspan, MD  ARTIFICIAL TEAR SOLUTION OP Apply to eye. Instill 2 drops into both eyes four times daily as needed    Historical Provider, MD  feeding supplement, ENSURE ENLIVE, (ENSURE ENLIVE) LIQD Take 237 mLs by mouth 2 (two) times daily between meals. Patient taking differently: Take 237 mLs by mouth 3 (three) times daily between meals.  03/11/15   Geradine Girt, DO  ferrous sulfate 325 (65 FE) MG tablet TAKE 1 TABLET BY MOUTH EVERY DAY 12/23/14  Susy Frizzle, MD  fluticasone [facility]) 50 MCG/ACT nasal spray Place 2 sprays into both nostrils daily.    Historical Provider, MD  guaiFENesin 200 MG tablet Take 200 mg by mouth every 4 (four) hours as needed for cough or to loosen phlegm.    Historical Provider, MD  ipratropium-albuterol (DUONEB) 0.5-2.5 (3) MG/3ML SOLN Take 3 mLs by nebulization. Every 4-6 hours as needed for SOB/wheezing    Historical Provider, MD  levalbuterol (XOPENEX) 0.63 MG/3ML nebulizer solution Take 3 mLs (0.63 mg total) by nebulization every 4 (four) hours as needed for wheezing or shortness of breath. 03/11/15   Geradine Girt, DO  levothyroxine (SYNTHROID, LEVOTHROID) 25 MCG tablet TAKE 1 TABLET BY MOUTH EVERY DAY  03/27/15   Susy Frizzle, MD  losartan (COZAAR) 50 MG tablet Take 50 mg by mouth daily. 01/23/15   Historical Provider, MD  omeprazole (PRILOSEC) 40 MG capsule Take 40 mg by mouth daily.    Historical Provider, MD  potassium chloride SA (K-DUR,KLOR-CON) 20 MEQ tablet Take 40 mEq by mouth daily. 10/30/15   Historical Provider, MD  pravastatin (PRAVACHOL) 20 MG tablet Take 20 mg by mouth daily.    Historical Provider, MD  rivaroxaban (XARELTO) 20 MG TABS tablet Take 1 tablet (20 mg total) by mouth daily with supper. 03/11/15   Geradine Girt, DO  tamsulosin (FLOMAX) 0.4 MG CAPS capsule Take 1 capsule (0.4 mg total) by mouth daily. 03/11/15   Geradine Girt, DO  venlafaxine XR (EFFEXOR XR) 37.5 MG 24 hr capsule Take 2 capsules (75 mg total) by mouth daily with breakfast. 2 pills in AM 03/11/15   Jessica U Vann, DO   SpO2 81% Physical Exam  Constitutional: He appears well-developed and well-nourished. He appears lethargic.  HENT:  Head: Normocephalic and atraumatic.  Right Ear: External ear normal.  Left Ear: External ear normal.  Nose: Nose normal.  Eyes: Right eye exhibits no discharge. Left eye exhibits no discharge.  Neck: Neck supple.  Cardiovascular: Normal rate, regular rhythm, normal heart sounds and intact distal pulses.   Pulmonary/Chest: Effort normal. He has wheezes. He has rales.  Basilar rales with expiratory wheezes Poor inspiratory effort  Abdominal: Soft. There is no tenderness.  Musculoskeletal: He exhibits no edema.  Neurological: He appears lethargic.  Skin: Skin is warm and dry.  Nursing note and vitals reviewed.   ED Course  Procedures (including critical care time) Labs Review Labs Reviewed  COMPREHENSIVE METABOLIC PANEL - Abnormal; Notable for the following:    Potassium 5.2 (*)    Glucose, Bld 161 (*)    ALT 13 (*)    Total Bilirubin 0.1 (*)    All other components within normal limits  CBC WITH DIFFERENTIAL/PLATELET - Abnormal; Notable for the following:     WBC 11.1 (*)    RBC 3.95 (*)    Hemoglobin 12.1 (*)    HCT 37.6 (*)    Monocytes Absolute 2.2 (*)    All other components within normal limits  BRAIN NATRIURETIC PEPTIDE - Abnormal; Notable for the following:    B Natriuretic Peptide 479.5 (*)    All other components within normal limits  I-STAT ARTERIAL BLOOD GAS, ED - Abnormal; Notable for the following:    pH, Arterial 7.122 (*)    pCO2 arterial 79.2 (*)    pO2, Arterial 322.0 (*)    Bicarbonate 25.8 (*)    Acid-base deficit 6.0 (*)    All other components within normal limits  I-STAT  CG4 LACTIC ACID, ED - Abnormal; Notable for the following:    Lactic Acid, Venous 5.37 (*)    All other components within normal limits  I-STAT CHEM 8, ED - Abnormal; Notable for the following:    Glucose, Bld 157 (*)    All other components within normal limits  CULTURE, BLOOD (ROUTINE X 2)  CULTURE, BLOOD (ROUTINE X 2)  URINE CULTURE  URINALYSIS, ROUTINE W REFLEX MICROSCOPIC (NOT AT [facility])  Randolm Idol, ED  I-STAT CG4 LACTIC ACID, ED    Imaging Review Dg Chest Port 1 View  11/27/2015  CLINICAL DATA:  Hypoxia.  Fever. EXAM: PORTABLE CHEST 1 VIEW COMPARISON:  10/31/2015. FINDINGS: Stable enlarged cardiac silhouette and tortuous, partially calcified thoracic aorta. Interval patchy opacity in the right upper lobe. Unremarkable bones. IMPRESSION: Right upper lobe pneumonia. Electronically Signed   By: Claudie Revering M.D.   On: 11/27/2015 12:38   I have personally reviewed and evaluated these images and lab results as part of my medical decision-making.   EKG Interpretation   Date/Time:  Thursday November 27 2015 12:08:47 EST Ventricular Rate:  77 PR Interval:    QRS Duration: 94 QT Interval:  392 QTC Calculation: 444 R Axis:   -46 Text Interpretation:  Atrial fibrillation LAD, consider left anterior  fascicular block Borderline low voltage, extremity leads Confirmed by  Jancarlo Biermann  MD, Elvin Banker ([id number]) on 11/27/2015 12:54:47 PM       CRITICAL CARE Performed by: Sherwood Gambler T   Total critical care time: 35 minutes  Critical care time was exclusive of separately billable procedures and treating other patients.  Critical care was necessary to treat or prevent imminent or life-threatening deterioration.  Critical care was time spent personally by me on the following activities: development of treatment plan with patient and/or surrogate as well as nursing, discussions with consultants, evaluation of patient's response to treatment, examination of patient, obtaining history from patient or surrogate, ordering and performing treatments and interventions, ordering and review of laboratory studies, ordering and review of radiographic studies, pulse oximetry and re-evaluation of patient's condition.  MDM   Final diagnoses:  Acute on chronic respiratory failure with hypoxia and hypercapnia (HCC)  HCAP (healthcare-associated pneumonia)    82 year old male presents with acute wrist were distress. He is currently lethargic that seems to be from CO2 narcosis. I had a long discussion with his daughter who is his power of attorney. After discussion, she notes that he would not want to have CPR or intubation. He was thus made DO NOT RESUSCITATE. She would like to try BiPAP. He seems to be having mental status improvement with BiPAP. His oxygenation is much better with supportive oxygen. He was given albuterol as well as broad antibiotics for pneumonia. Initial lactate is quite elevated at 5, this could be sepsis but also likely related to his ongoing hypoxia prior to arrival. Patient will be admitted to the stepdown unit with the hospitalist.    Sherwood Gambler, MD 11/27/15 1525

## 2015-11-27 NOTE — Progress Notes (Signed)
Pt arrived to [place], pt currently on 3L [place] with rr in 40's. Bipap ordered as needed, respiratory therapist notified of pt's arrival and need of bipap. HR afib rate controlled, and BP stable. MD notified of pt's arrival. Pt a&ox1. Mouth care given, bipap placed on patient. Daughter at bedside. Will continue to monitor.

## 2015-11-27 NOTE — Progress Notes (Signed)
RT NTS the patient and was only able to remove a small amount of white thick mucous. RT will continue to monitor.

## 2015-11-27 NOTE — Progress Notes (Signed)
ANTICOAGULATION CONSULT NOTE - Initial Consult  Pharmacy Consult for Lovenox Indication: atrial fibrillation  No Known Allergies  Patient Measurements: Weight = 60.5kg  Vital Signs: Temp: 97.3 F (36.3 C) (01/26 1225) Temp Source: Rectal (01/26 1225) BP: 136/72 mmHg (01/26 1530) Pulse Rate: 80 (01/26 1530)  Labs:  Recent Labs  11/27/15 1212 11/27/15 1227  HGB 12.1* 13.9  HCT 37.6* 41.0  PLT 382  --   CREATININE 0.91 0.80    Estimated Creatinine Clearance: 53.6 mL/min (by C-G formula based on Cr of 0.8).   Medical History: Past Medical History  Diagnosis Date  . Asthma   . Hypertension   . A-fib ([place])   . Hyperlipidemia   . Shortness of breath   . CHF (congestive heart failure) ([place]) 10/28/2011  . Forgetfulness   . PVC's (premature ventricular contractions)   . Mild aortic insufficiency   . Dementia     [name] 02/05/2015  . Failure to thrive in adult     /notes 02/05/2015  . COPD (chronic obstructive pulmonary disease) ([place])   . Hypothyroidism     [name] 02/05/2015  . Fall 02/04/2015    with altered mental status off of his baseline after he suffered a mechanical fall /notes 02/04/2015  . CAP (community acquired pneumonia)     [name] 02/05/2015  . Colon cancer [facility])    Assessment: 89yom on xarelto pta for afib (CHADSVASC = 3) being admitted with acute respiratory failure. He was made NPO, xarelto held, and will bridge with full dose lovenox. Last xarelto dose 1/25 at 1700, however, Dr. Sheran Fava would like to start lovenox tomorrow morning. Renal function stable with CrCl ~ 42ml/min.  Goal of Therapy:  Anti-Xa level 0.6-1 units/ml 4hrs after LMWH dose given Monitor platelets by anticoagulation protocol: Yes   Plan:  1) Lovenox 60mg  sq q12 - first dose 1/27 at 0800 2) CBC q72  Deboraha Sprang 11/27/2015,4:50 PM

## 2015-11-27 NOTE — ED Notes (Signed)
Daughter [name]-- [phone]   POA

## 2015-11-27 NOTE — ED Notes (Signed)
Patient placed on monitor, continuous pulse oximetry, blood pressure cuff and oxygen NRB (15L)

## 2015-11-28 DIAGNOSIS — Z66 Do not resuscitate: Secondary | ICD-10-CM

## 2015-11-28 DIAGNOSIS — L8990 Pressure ulcer of unspecified site, unspecified stage: Secondary | ICD-10-CM | POA: Insufficient documentation

## 2015-11-28 DIAGNOSIS — J189 Pneumonia, unspecified organism: Secondary | ICD-10-CM

## 2015-11-28 LAB — URINALYSIS, ROUTINE W REFLEX MICROSCOPIC
BILIRUBIN URINE: NEGATIVE
GLUCOSE, UA: NEGATIVE mg/dL
Ketones, ur: NEGATIVE mg/dL
NITRITE: NEGATIVE
PH: 5 (ref 5.0–8.0)
Protein, ur: NEGATIVE mg/dL
SPECIFIC GRAVITY, URINE: 1.019 (ref 1.005–1.030)

## 2015-11-28 LAB — BASIC METABOLIC PANEL
Anion gap: 12 (ref 5–15)
BUN: 18 mg/dL (ref 6–20)
CALCIUM: 9.4 mg/dL (ref 8.9–10.3)
CO2: 25 mmol/L (ref 22–32)
CREATININE: 0.92 mg/dL (ref 0.61–1.24)
Chloride: 106 mmol/L (ref 101–111)
GFR calc non Af Amer: >60 mL/min (ref >60)
Glucose, Bld: 127 mg/dL — ABNORMAL HIGH (ref 65–99)
Potassium: 4.6 mmol/L (ref 3.5–5.1)
SODIUM: 143 mmol/L (ref 135–145)

## 2015-11-28 LAB — URINE MICROSCOPIC-ADD ON

## 2015-11-28 LAB — CBC
HCT: 30.8 % — ABNORMAL LOW (ref 39.0–52.0)
Hemoglobin: 10.1 g/dL — ABNORMAL LOW (ref 13.0–17.0)
MCH: 30.1 pg (ref 26.0–34.0)
MCHC: 32.8 g/dL (ref 30.0–36.0)
MCV: 91.7 fL (ref 78.0–100.0)
PLATELETS: 308 10*3/uL (ref 150–400)
RBC: 3.36 MIL/uL — AB (ref 4.22–5.81)
RDW: 15.1 % (ref 11.5–15.5)
WBC: 9.4 10*3/uL (ref 4.0–10.5)

## 2015-11-28 LAB — TROPONIN I
TROPONIN I: 0.09 ng/mL — AB (ref <0.031)
Troponin I: 0.07 ng/mL — ABNORMAL HIGH (ref <0.031)

## 2015-11-28 LAB — INFLUENZA PANEL BY PCR (TYPE A & B)
H1N1 flu by pcr: NOT DETECTED
INFLAPCR: NEGATIVE
Influenza B By PCR: NEGATIVE

## 2015-11-28 MED ORDER — METOPROLOL TARTRATE 1 MG/ML IV SOLN
5.0000 mg | Freq: Three times a day (TID) | INTRAVENOUS | Status: DC
  Administered 2015-11-28: 3 mg via INTRAVENOUS
  Filled 2015-11-28: qty 5

## 2015-11-28 MED ORDER — IPRATROPIUM-ALBUTEROL 0.5-2.5 (3) MG/3ML IN SOLN
3.0000 mL | RESPIRATORY_TRACT | Status: DC | PRN
  Administered 2015-12-03: 3 mL via RESPIRATORY_TRACT
  Filled 2015-11-28: qty 3

## 2015-11-28 MED ORDER — CETYLPYRIDINIUM CHLORIDE 0.05 % MT LIQD
7.0000 mL | Freq: Two times a day (BID) | OROMUCOSAL | Status: DC
  Administered 2015-11-29 – 2015-12-04 (×11): 7 mL via OROMUCOSAL

## 2015-11-28 MED ORDER — CHLORHEXIDINE GLUCONATE CLOTH 2 % EX PADS
6.0000 | MEDICATED_PAD | Freq: Every day | CUTANEOUS | Status: AC
Start: 2015-11-28 — End: 2015-12-02
  Administered 2015-11-28 – 2015-12-02 (×5): 6 via TOPICAL

## 2015-11-28 MED ORDER — METOPROLOL TARTRATE 1 MG/ML IV SOLN
2.5000 mg | Freq: Three times a day (TID) | INTRAVENOUS | Status: DC
  Administered 2015-11-28: 2.5 mg via INTRAVENOUS
  Filled 2015-11-28 (×2): qty 5

## 2015-11-28 MED ORDER — MUPIROCIN 2 % EX OINT
1.0000 "application " | TOPICAL_OINTMENT | Freq: Two times a day (BID) | CUTANEOUS | Status: AC
  Administered 2015-11-28 – 2015-12-02 (×9): 1 via NASAL
  Filled 2015-11-28 (×2): qty 22

## 2015-11-28 NOTE — Care Management Note (Signed)
Case Management Note  Patient Details  Name: Todd [name] MRN: [id number] Date of Birth: [birth year]  Subjective/Objective:     Patient is from [facility] [place] aware.  Also palliative consulted.               Action/Plan:   Expected Discharge Date:                  Expected Discharge Plan:  Skilled Nursing Facility  In-House Referral:  Clinical Social Work  Discharge planning Services  CM Consult  Post Acute Care Choice:    Choice offered to:     DME Arranged:    DME Agency:     HH Arranged:    [place] Agency:     Status of Service:  In process, will continue to follow  Medicare Important Message Given:    Date Medicare IM Given:    Medicare IM give by:    Date Additional Medicare IM Given:    Additional Medicare Important Message give by:     If discussed at [place] of Stay Meetings, dates discussed:    Additional Comments:  Zenon Mayo, RN 11/28/2015, 11:02 AM

## 2015-11-28 NOTE — Progress Notes (Signed)
SLP Cancellation Note  Patient Details Name: Todd [name] MRN: [id number] DOB: 05-16-1924   Cancelled treatment:       Reason Eval/Treat Not Completed: Patient not medically ready   Defer swallow eval at this time as pt has a history of moderate-severe dysphagia even when not critically ill as he is now. Pt had MBS on 11/03/15 with the following findings:  "Patient presents with a moderate-severe dysphagia, primarily pharyngeal in nature with both sensory and motor impairments. Patient with base of tongue, laryngeal, and pharyngeal weakness resulting with moderate-severe residuals with thicker consistencies and decreased airway protection both during and after the swallow. Unfortunately, thinner consistencies, which result in less residue, cause a delay in swallow initiation and penetration/aspiration before and during the swallow. No consistencies will fully prevent aspiration at this time. Cognitive status does not allow for effective carryout of compensatory strategies to either prevent or clear penetrates/aspirates. Will continue current diet at this time and discuss with POA."  During prior admit SLP discussed severity of impairment and family decided to allow pt to consume diet with known risk. See SLP note Gabriel Rainwater, SLP ) 11/13/15.   Do not anticipate any functional improvement at this time. Pt/family will need to accept risk of aspiration to initiate diet. [facility] team will be able to facilitate these decisions. Will follow chart for discussion regarding further SLP needs.   Todd [name], [place] CCC-SLP [phone]  Todd [name] 11/28/2015, 10:25 AM

## 2015-11-28 NOTE — Progress Notes (Signed)
PROGRESS NOTE  Todd [name] [id number] DOB: 25-Dec-1923 DOA: 11/27/2015 PCP: Odette Fraction, MD  Assessment/Plan: Acute hypoxic respiratory failure and sepsis with lactic acidosis due to HCAP versus aspiration pneumonia - Sputum culture if able - F/u blood cultures - Vancomycin and cefepime plus Flagyl - not able to tolerate Bipap - Flu PCR  - NPO- spoke with daughter about comfort feeds- open to palliative care consult   COPD with acute exacerbation,  - start solumedrol daily  - Start duonebs - Antibiotics as above  Chronic diastolic heart failure - patient appears euvolemic, no LEE - Daily weights and strict I/O  Chronic atrial fibrillation - patient's CHA2DS2-VASc Score for Stroke Risk is 3, hold Xarelto and all oral medications  Hypothyroidism - continue Synthroid IV for now  Severe protein caloric malnutrition, NPO pending improvement in breathing/swallowing  Iron deficiency anemia, hold oral iron for now  Mild hyperglycemia, likely due to steroids and stress - Consider starting SSI if persistent  Hyperkalemia resolved with IVF  Leukocytosis is only mild and may be related to dehydration or sepsis  Code Status:DNR Family Communication: spoke with daughter Disposition Plan: await palliative care consult   Consultants:  Palliative care  Procedures:      HPI/Subjective: Will answer simple questions Where are you?: "here"  Objective: Filed Vitals:   11/28/15 0800 11/28/15 1031  BP: 161/133 137/75  Pulse: 103 63  Temp:    Resp: 25 19    Intake/Output Summary (Last 24 hours) at 11/28/15 1122 Last data filed at 11/28/15 0800  Gross per 24 hour  Intake    465 ml  Output      0 ml  Net    465 ml   Filed Weights   11/27/15 1900 11/28/15 0500  Weight: 60.51 kg (133 lb 6.4 oz) 62.8 kg (138 lb 7.2 oz)    Exam:   General:  Awake, very mild resp distress  Cardiovascular: mildly tachy low 100s  Respiratory: ronchi  b/l  Abdomen: +Bs, soft  Musculoskeletal: no edema   Data Reviewed: Basic Metabolic Panel:  Recent Labs Lab 11/27/15 1212 11/27/15 1227 11/28/15 0029  NA 142 142 143  K 5.2* 4.9 4.6  CL 107 106 106  CO2 22  --  25  GLUCOSE 161* 157* 127*  BUN 16 20 18   CREATININE 0.91 0.80 0.92  CALCIUM 9.6  --  9.4   Liver Function Tests:  Recent Labs Lab 11/27/15 1212  AST 20  ALT 13*  ALKPHOS 94  BILITOT 0.1*  PROT 7.3  ALBUMIN 3.5   No results for input(s): LIPASE, AMYLASE in the last 168 hours. No results for input(s): AMMONIA in the last 168 hours. CBC:  Recent Labs Lab 11/27/15 1212 11/27/15 1227 11/28/15 0029  WBC 11.1*  --  9.4  NEUTROABS 7.4  --   --   HGB 12.1* 13.9 10.1*  HCT 37.6* 41.0 30.8*  MCV 95.2  --  91.7  PLT 382  --  308   Cardiac Enzymes:  Recent Labs Lab 11/27/15 1850 11/28/15 0029 11/28/15 0710  TROPONINI 0.04* 0.09* 0.07*   BNP (last 3 results)  Recent Labs  03/29/15 0422 10/31/15 1350 11/27/15 1212  BNP 327.5* 382.1* 479.5*    ProBNP (last 3 results) No results for input(s): PROBNP in the last 8760 hours.  CBG: No results for input(s): GLUCAP in the last 168 hours.  Recent Results (from the past 240 hour(s))  MRSA PCR Screening     Status: Abnormal  Collection Time: 11/27/15  5:44 PM  Result Value Ref Range Status   MRSA by PCR POSITIVE (A) NEGATIVE Final    Comment:        The GeneXpert MRSA Assay (FDA approved for NASAL specimens only), is one component of a comprehensive MRSA colonization surveillance program. It is not intended to diagnose MRSA infection nor to guide or monitor treatment for MRSA infections. RESULT CALLED TO, READ BACK BY AND VERIFIED WITH: L.SHORT,RN AT 2055 BY L.PITT 11/27/15      Studies: Dg Chest Port 1 View  11/27/2015  CLINICAL DATA:  Hypoxia.  Fever. EXAM: PORTABLE CHEST 1 VIEW COMPARISON:  10/31/2015. FINDINGS: Stable enlarged cardiac silhouette and tortuous, partially calcified  thoracic aorta. Interval patchy opacity in the right upper lobe. Unremarkable bones. IMPRESSION: Right upper lobe pneumonia. Electronically Signed   By: Claudie Revering M.D.   On: 11/27/2015 12:38    Scheduled Meds: . arformoterol  15 mcg Nebulization BID  . budesonide (PULMICORT) nebulizer solution  0.5 mg Nebulization BID  . ceFEPime (MAXIPIME) IV  2 g Intravenous Q12H  . enoxaparin (LOVENOX) injection  60 mg Subcutaneous Q12H  . ipratropium-albuterol  3 mL Nebulization QID  . levothyroxine  12.5 mcg Intravenous Daily  . methylPREDNISolone (SOLU-MEDROL) injection  60 mg Intravenous Daily  . metoprolol  2.5 mg Intravenous 3 times per day  . metronidazole  500 mg Intravenous Q8H  . vancomycin  500 mg Intravenous Q12H   Continuous Infusions: . dextrose 5 % and 0.45% NaCl 50 mL/hr at 11/28/15 0636   Antibiotics Given (last 72 hours)    Date/Time Action Medication Dose Rate   11/27/15 1941 Given   metroNIDAZOLE (FLAGYL) IVPB 500 mg 500 mg 100 mL/hr   11/28/15 0037 Given   ceFEPIme (MAXIPIME) 2 g in dextrose 5 % 50 mL IVPB 2 g 100 mL/hr   11/28/15 0300 Given   vancomycin (VANCOCIN) 500 mg in sodium chloride 0.9 % 100 mL IVPB 500 mg 100 mL/hr   11/28/15 0300 Given   metroNIDAZOLE (FLAGYL) IVPB 500 mg 500 mg 100 mL/hr   11/28/15 1016 Given   metroNIDAZOLE (FLAGYL) IVPB 500 mg 500 mg 100 mL/hr      Principal Problem:   HCAP (healthcare-associated pneumonia) Active Problems:   Acute respiratory failure with hypoxia ([facility])   Chronic diastolic CHF (congestive heart failure) ([place])   Hypothyroidism   Protein-calorie malnutrition, severe ([place])   COPD exacerbation ([place])   COPD (chronic obstructive pulmonary disease) ([place])   Dysphagia   DNR (do not resuscitate)   Pressure ulcer    Time spent: 35 min    [facility] Pager [phone]. If 7PM-7AM, please contact night-coverage at [email or web address], password [facility] 11/28/2015, 11:22 AM  LOS: 1 day

## 2015-11-28 NOTE — Progress Notes (Addendum)
Physician notified: Eliseo Squires At: 70  Regarding: FYI pt only able to tolerate 3mg  IV Lopressor due to HR dropping to 55 after 5 mins. will continue to monitor HR and BP.  Order modified.

## 2015-11-28 NOTE — Progress Notes (Signed)
Pt HR dropping into the 40s.  Pt asymptomatic.  Dr. Rogue Bussing notified.  EKG performed.  No other new orders at this time.  Will continue to monitor.  Metoprolol held.

## 2015-11-29 DIAGNOSIS — Z515 Encounter for palliative care: Secondary | ICD-10-CM

## 2015-11-29 LAB — CBC
HCT: 29.9 % — ABNORMAL LOW (ref 39.0–52.0)
Hemoglobin: 9.8 g/dL — ABNORMAL LOW (ref 13.0–17.0)
MCH: 29.8 pg (ref 26.0–34.0)
MCHC: 32.8 g/dL (ref 30.0–36.0)
MCV: 90.9 fL (ref 78.0–100.0)
PLATELETS: 317 10*3/uL (ref 150–400)
RBC: 3.29 MIL/uL — AB (ref 4.22–5.81)
RDW: 15.2 % (ref 11.5–15.5)
WBC: 5.9 10*3/uL (ref 4.0–10.5)

## 2015-11-29 LAB — BASIC METABOLIC PANEL
Anion gap: 9 (ref 5–15)
BUN: 21 mg/dL — AB (ref 6–20)
CHLORIDE: 108 mmol/L (ref 101–111)
CO2: 25 mmol/L (ref 22–32)
CREATININE: 0.87 mg/dL (ref 0.61–1.24)
Calcium: 9.2 mg/dL (ref 8.9–10.3)
Glucose, Bld: 144 mg/dL — ABNORMAL HIGH (ref 65–99)
POTASSIUM: 3.8 mmol/L (ref 3.5–5.1)
SODIUM: 142 mmol/L (ref 135–145)

## 2015-11-29 LAB — URINE CULTURE: CULTURE: NO GROWTH

## 2015-11-29 LAB — LACTIC ACID, PLASMA: Lactic Acid, Venous: 1.2 mmol/L (ref 0.5–2.0)

## 2015-11-29 MED ORDER — RESOURCE THICKENUP CLEAR PO POWD
ORAL | Status: DC | PRN
Start: 2015-11-29 — End: 2015-12-04
  Filled 2015-11-29: qty 125

## 2015-11-29 MED ORDER — HYDRALAZINE HCL 20 MG/ML IJ SOLN: 10.0000 mg | Freq: Four times a day (QID) | INTRAMUSCULAR | Status: DC | PRN

## 2015-11-29 NOTE — Consult Note (Signed)
Consultation Note Date: 11/29/2015   Patient Name: Todd [name]  DOB: 04-22-1924  MRN: [id number]  Age / Sex: 82 y.o., male  PCP: Susy Frizzle, MD Referring Physician: Geradine Girt, DO  Reason for Consultation: Establishing goals of care    Clinical Assessment/Narrative: Pt is a 82 yo man with recurrent asp pna, uti's, dementia a-fib readmitted with pna and uti. Pt has been seen by PMT in February 06, 2011 as well as 6/16. Pt has been living in a SNF. He is now bed bound at baseline. Daughter describes him as "capable 5 days out of 7"; then he will have a day where he is confused, and another day where he sleeps all day. Since admission pt has only been minimally awake, despite abx and IVF. He is not eating, NPO due to somnolence. He did have a MBS approx 1 month ago which shows dysphagia, high risk for aspiration. Pt has been on comfort feeds in the SNF. Daughter reports appetite has been good up until he became sick this time. Daughter is struggling with dementia presentation and disease progression in her father. Her mother died from AD. She saw a much more definitive decline with her mother in that she was clearly having more bad days than good.   Contacts/Participants in Discussion: Primary Decision Maker: Debra Chrismon   Relationship to Patient daughter HCPOA: yes  Pt is a widower since 20. Loss of his wife has been very difficult to pt and per daughter is still actively grieving. His son also died in 02/05/2013  SUMMARY OF RECOMMENDATIONS Pt has been on comfort feeds in SNF Asked daughter to clarify that this is still our goal vs. artificial feeding. She does reiterate that she WANTS to resume comfort feeds as soon as he is more alert. She understands that he may aspirate. She does NOT want a feeding tube . She views him as still having quality of life in the SNF Wants abx and IVF continued for now. Treat the treatable  which has been her goal in the past. She has viewed previous hospitalizations as more beneficial than a hardship  Code Status/Advance Care Planning: DNR    Code Status Orders        Start     Ordered   11/27/15 1740  Do not attempt resuscitation (DNR)   Continuous    Question Answer Comment  In the event of cardiac or respiratory ARREST Do not call a "code blue"   In the event of cardiac or respiratory ARREST Do not perform Intubation, CPR, defibrillation or ACLS   In the event of cardiac or respiratory ARREST Use medication by any route, position, wound care, and other measures to relive pain and suffering. May use oxygen, suction and manual treatment of airway obstruction as needed for comfort.      11/27/15 1739    Code Status History    Date Active Date Inactive Code Status Order ID Comments User Context   11/27/2015 12:38 PM 11/27/2015  5:39 PM DNR [id number]  Sherwood Gambler, MD ED   10/31/2015  3:45 PM 11/04/2015  4:24 PM DNR [id number]  Caren Griffins, MD ED   03/29/2015 11:22 AM 04/05/2015  9:01 PM DNR [id number]  Samella Parr, NP Inpatient   03/08/2015  3:28 PM 03/11/2015  7:25 PM DNR [id number]  Bonnielee Haff, MD Inpatient   02/16/2015  8:28 PM 03/08/2015  3:28 PM DNR [id number]  Hennie Duos, MD Outpatient   02/05/2015  2:07 AM 02/08/2015  11:27 PM DNR [id number]  Deneise Lever, MD ED   02/05/2015  1:43 AM 02/05/2015  2:07 AM Full Code [id number]  Deneise Lever, MD ED   09/06/2014 12:21 AM 09/10/2014  9:52 PM DNR [id number]  Ivor Costa, MD Inpatient   11/23/2013 10:09 PM 11/28/2013  3:37 PM DNR [id number]  Etta Quill, DO ED   09/08/2011  3:32 PM 09/20/2011  7:31 PM DNR [id number]  Raylene Miyamoto, MD Inpatient      Other Directives:None  Symptom Management:   Pain: No current s/s of pain. Continue rx per attending for now  Palliative Prophylaxis:   Aspiration, Bowel Regimen, Frequent Pain Assessment and Turn Reposition   Psycho-social/Spiritual:  Support System:  Adequate Desire for further Chaplaincy support:no Additional Recommendations: Grief/Bereavement Support  Prognosis: Feel that pt does meet hospice criteria given moderate dementia with co-morbidities of a-fib, recurrent asp pna and uti's, debility, bed bound status and protein calorie malnutrition  Discharge Planning: TBD but anticipate back to SNF unless he has a clear clinical decline signaling EOL   Chief Complaint/ Primary Diagnoses: Present on Admission:  . HCAP (healthcare-associated pneumonia) . Acute respiratory failure with hypoxia ([place]) . COPD (chronic obstructive pulmonary disease) ([place]) . COPD exacerbation ([place]) . DNR (do not resuscitate) . Hypothyroidism . Protein-calorie malnutrition, severe ([place]) . Chronic diastolic CHF (congestive heart failure) ([place])  I have reviewed the medical record, interviewed the patient and family, and examined the patient. The following aspects are pertinent.  Past Medical History  Diagnosis Date  . Asthma   . Hypertension   . A-fib ([place])   . Hyperlipidemia   . Shortness of breath   . CHF (congestive heart failure) ([place]) 10/28/2011  . Forgetfulness   . PVC's (premature ventricular contractions)   . Mild aortic insufficiency   . Dementia     [name] 02/05/2015  . Failure to thrive in adult     /notes 02/05/2015  . COPD (chronic obstructive pulmonary disease) ([place])   . Hypothyroidism     [name] 02/05/2015  . Fall 02/04/2015    with altered mental status off of his baseline after he suffered a mechanical fall /notes 02/04/2015  . CAP (community acquired pneumonia)     [name] 02/05/2015  . Colon cancer [facility])    Social History   Social History  . Marital Status: Widowed    Spouse Name: N/A  . Number of Children: 2  . Years of Education: 12   Occupational History  . 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 Smokeless tobacco: None     Comment: quit 30+ yrs ago  .  Alcohol Use: No  . Drug Use: No  . Sexual Activity: No   Other Topics Concern  . None   Social History Narrative   [place] man. 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 Widowed 08-Feb-2014. Had a son who died at 27. Has a daughter, 2 grandchildren, 2 great-grandchildren. Reports that he was living with his daughter   Family History  Problem Relation Age of Onset  . Hyperlipidemia Father   . Hypertension Father   . Heart disease Father   . Asthma Father   . Stroke Father   . Heart failure Father   . Heart disease Mother   . Heart failure Mother   . Heart attack  Sister    Scheduled Meds: . antiseptic oral rinse  7 mL Mouth Rinse BID  . arformoterol  15 mcg Nebulization BID  . budesonide (PULMICORT) nebulizer solution  0.5 mg Nebulization BID  . ceFEPime (MAXIPIME) IV  2 g Intravenous Q12H  . Chlorhexidine Gluconate Cloth  6 each Topical Q0600  . enoxaparin (LOVENOX) injection  60 mg Subcutaneous Q12H  . ipratropium-albuterol  3 mL Nebulization QID  . levothyroxine  12.5 mcg Intravenous Daily  . methylPREDNISolone (SOLU-MEDROL) injection  60 mg Intravenous Daily  . metronidazole  500 mg Intravenous Q8H  . mupirocin ointment  1 application Nasal BID  . vancomycin  500 mg Intravenous Q12H   Continuous Infusions: . dextrose 5 % and 0.45% NaCl 50 mL/hr at 11/29/15 0600   PRN Meds:.hydrALAZINE, ipratropium-albuterol Medications Prior to Admission:  Prior to Admission medications   Medication Sig Start Date End Date Taking? Authorizing Provider  acetaminophen (TYLENOL) 325 MG tablet Take 650 mg by mouth every 6 (six) hours as needed for mild pain.   Yes Historical Provider, MD  ADVAIR DISKUS 500-50 MCG/DOSE AEPB INHALE 1 PUFF TWICE A DAY 12/23/14  Yes Susy Frizzle, MD  amoxicillin-clavulanate (AUGMENTIN) 875-125 MG tablet Take 1 tablet by mouth 2 (two) times daily. 11/04/15  Yes Costin Karlyne Greenspan, MD  ARTIFICIAL  TEAR SOLUTION OP Apply to eye. Instill 2 drops into both eyes four times daily as needed   Yes Historical Provider, MD  dextromethorphan-guaiFENesin (MUCINEX DM) 30-600 MG 12hr tablet Take 1 tablet by mouth 2 (two) times daily.   Yes Historical Provider, MD  feeding supplement, ENSURE ENLIVE, (ENSURE ENLIVE) LIQD Take 237 mLs by mouth 2 (two) times daily between meals. Patient taking differently: Take 237 mLs by mouth 3 (three) times daily between meals.  03/11/15  Yes Geradine Girt, DO  ferrous sulfate 325 (65 FE) MG tablet TAKE 1 TABLET BY MOUTH EVERY DAY 12/23/14  Yes Susy Frizzle, MD  fluticasone [facility]) 50 MCG/ACT nasal spray Place 2 sprays into both nostrils daily.   Yes Historical Provider, MD  guaiFENesin 200 MG tablet Take 200 mg by mouth every 4 (four) hours as needed for cough or to loosen phlegm.   Yes Historical Provider, MD  ipratropium-albuterol (DUONEB) 0.5-2.5 (3) MG/3ML SOLN Take 3 mLs by nebulization. Every 4-6 hours as needed for SOB/wheezing   Yes Historical Provider, MD  levalbuterol (XOPENEX) 0.63 MG/3ML nebulizer solution Take 3 mLs (0.63 mg total) by nebulization every 4 (four) hours as needed for wheezing or shortness of breath. 03/11/15  Yes Geradine Girt, DO  levothyroxine (SYNTHROID, LEVOTHROID) 25 MCG tablet TAKE 1 TABLET BY MOUTH EVERY DAY 03/27/15  Yes Susy Frizzle, MD  losartan (COZAAR) 50 MG tablet Take 50 mg by mouth daily. 01/23/15  Yes Historical Provider, MD  omeprazole (PRILOSEC) 40 MG capsule Take 40 mg by mouth daily.   Yes Historical Provider, MD  potassium chloride SA (K-DUR,KLOR-CON) 20 MEQ tablet Take 40 mEq by mouth daily. 10/30/15  Yes Historical Provider, MD  pravastatin (PRAVACHOL) 20 MG tablet Take 20 mg by mouth daily.   Yes Historical Provider, MD  rivaroxaban (XARELTO) 20 MG TABS tablet Take 1 tablet (20 mg total) by mouth daily with supper. 03/11/15  Yes Geradine Girt, DO  tamsulosin (FLOMAX) 0.4 MG CAPS capsule Take 1 capsule (0.4 mg total)  by mouth daily. 03/11/15  Yes Geradine Girt, DO  venlafaxine XR (EFFEXOR XR) 37.5 MG 24 hr capsule Take 2 capsules (  75 mg total) by mouth daily with breakfast. 2 pills in AM Patient taking differently: Take 75 mg by mouth daily with breakfast.  03/11/15  Yes Geradine Girt, DO   No Known Allergies  Review of Systems  Unable to perform ROS: Mental status change    Physical Exam  Constitutional:  Cachetic elderly man minimally responsive  HENT:  Head: Normocephalic.  Respiratory: Effort normal.  GI: Soft.  Neurological:  Awakens briefly to voice then falls back to sleep. Not following commands  Skin: Skin is warm and dry.    Vital Signs: BP 130/56 mmHg  Pulse 60  Temp(Src) 98.1 F (36.7 C) (Axillary)  Resp 27  Ht 6' (1.829 m)  Wt 62 kg (136 lb 11 oz)  BMI 18.53 kg/m2  SpO2 100%  SpO2: SpO2: 100 % O2 Device:SpO2: 100 % O2 Flow Rate: .O2 Flow Rate (L/min): 6 L/min (turn back to 5L)  IO: Intake/output summary:  Intake/Output Summary (Last 24 hours) at 11/29/15 1110 Last data filed at 11/29/15 0700  Gross per 24 hour  Intake   1705 ml  Output    550 ml  Net   1155 ml    LBM: Last BM Date: 11/27/15 Baseline Weight: Weight: 60.51 kg (133 lb 6.4 oz) Most recent weight: Weight: 62 kg (136 lb 11 oz)      Palliative Assessment/Data:  Flowsheet Rows        Most Recent Value   Intake Tab    Referral Department  Hospitalist   Unit at Time of Referral  Intermediate Care Unit   Palliative Care Primary Diagnosis  Sepsis/Infectious Disease   Date Notified  11/28/15   Palliative Care Type  Return patient Palliative Care   Reason for referral  Clarify Goals of Care   Date of Admission  11/27/15   Date first seen by Palliative Care  11/29/15   # of days Palliative referral response time  1 Day(s)   # of days IP prior to Palliative referral  1   Clinical Assessment    Palliative Performance Scale Score  20%   Pain Max last 24 hours  Not able to report   Pain Min Last 24  hours  Not able to report   Dyspnea Max Last 24 Hours  Not able to report   Dyspnea Min Last 24 hours  Not able to report   Nausea Max Last 24 Hours  Not able to report   Nausea Min Last 24 Hours  Not able to report   Anxiety Max Last 24 Hours  Not able to report   Anxiety Min Last 24 Hours  Not able to report   Other Max Last 24 Hours  Not able to report   Psychosocial & Spiritual Assessment    Palliative Care Outcomes    Patient/Family meeting held?  Yes   Who was at the meeting?  via telephone with daughter Tomma Lightning   Patient/Family wishes: Interventions discontinued/not started   Mechanical Ventilation, Trach   Palliative Care follow-up planned  Yes, Facility      Additional Data Reviewed:  CBC:    Component Value Date/Time   WBC 5.9 11/29/2015 0245   WBC 7.6 08/18/2015   HGB 9.8* 11/29/2015 0245   HCT 29.9* 11/29/2015 0245   PLT 317 11/29/2015 0245   MCV 90.9 11/29/2015 0245   NEUTROABS 7.4 11/27/2015 1212   LYMPHSABS 1.1 11/27/2015 1212   MONOABS 2.2* 11/27/2015 1212   EOSABS 0.3 11/27/2015 1212  BASOSABS 0.0 11/27/2015 1212   Comprehensive Metabolic Panel:    Component Value Date/Time   NA 142 11/29/2015 0245   NA 140 08/18/2015   K 3.8 11/29/2015 0245   CL 108 11/29/2015 0245   CO2 25 11/29/2015 0245   BUN 21* 11/29/2015 0245   BUN 20 08/18/2015   CREATININE 0.87 11/29/2015 0245   CREATININE 1.0 08/18/2015   CREATININE 1.12 01/13/2015 1507   GLUCOSE 144* 11/29/2015 0245   CALCIUM 9.2 11/29/2015 0245   AST 20 11/27/2015 1212   ALT 13* 11/27/2015 1212   ALKPHOS 94 11/27/2015 1212   BILITOT 0.1* 11/27/2015 1212   PROT 7.3 11/27/2015 1212   ALBUMIN 3.5 11/27/2015 1212     Time In: 0800 Time Out: 0915 Time Total: 75 min Greater than 50%  of this time was spent counseling and coordinating care related to the above assessment and plan. Staffed with Dr. Eliseo Squires  Signed by: Dory Horn, NP  Dory Horn, NP  11/29/2015, 11:10 AM   Please contact Palliative Medicine Team phone at [phone] for questions and concerns.

## 2015-11-29 NOTE — Progress Notes (Signed)
PROGRESS NOTE  Todd [name] [id number] DOB: May 02, 1924 DOA: 11/27/2015 PCP: Odette Fraction, MD  Assessment/Plan: Acute hypoxic respiratory failure and sepsis with lactic acidosis due to HCAP versus aspiration pneumonia - Sputum culture if able - F/u blood cultures - Vancomycin and cefepime plus Flagyl - not able to tolerate Bipap - Flu PCR negative - NPO- SLP saw: During prior admit SLP discussed severity of impairment and family decided to allow pt to consume diet with known risk-- will start comfort feeds   COPD with acute exacerbation,  - start solumedrol daily  - Start duonebs - Antibiotics as above  Chronic diastolic heart failure - patient appears euvolemic, no LEE - Daily weights and strict I/O  Chronic atrial fibrillation - patient's CHA2DS2-VASc Score for Stroke Risk is 3, hold Xarelto and all oral medications  Hypothyroidism - continue Synthroid IV for now  Severe protein caloric malnutrition, NPO pending improvement in breathing/swallowing  Iron deficiency anemia, hold oral iron for now  Mild hyperglycemia, likely due to steroids and stress - Consider starting SSI if persistent  Hyperkalemia resolved with IVF  Leukocytosis is only mild and may be related to dehydration or sepsis  Poor overall prognosis- daughter does not appear to "get it"  Code Status:DNR Family Communication: spoke with daughter Disposition Plan: await palliative care consult   Consultants:  Palliative care  Procedures:      HPI/Subjective: Speaking fewer words-- will awake-- when asked if cold says "yes"  Objective: Filed Vitals:   11/29/15 0700 11/29/15 0710  BP:  130/56  Pulse:    Temp: 98.1 F (36.7 C)   Resp:  27    Intake/Output Summary (Last 24 hours) at 11/29/15 1202 Last data filed at 11/29/15 0700  Gross per 24 hour  Intake   1705 ml  Output    550 ml  Net   1155 ml   Filed Weights   11/27/15 1900 11/28/15 0500 11/29/15 0500   Weight: 60.51 kg (133 lb 6.4 oz) 62.8 kg (138 lb 7.2 oz) 62 kg (136 lb 11 oz)    Exam:   General:  Will awaken, hard of hearing  Cardiovascular: rrr, no longer tachy  Respiratory: ronchi b/l  Abdomen: +Bs, soft  Musculoskeletal: no edema   Data Reviewed: Basic Metabolic Panel:  Recent Labs Lab 11/27/15 1212 11/27/15 1227 11/28/15 0029 11/29/15 0245  NA 142 142 143 142  K 5.2* 4.9 4.6 3.8  CL 107 106 106 108  CO2 22  --  25 25  GLUCOSE 161* 157* 127* 144*  BUN 16 20 18  21*  CREATININE 0.91 0.80 0.92 0.87  CALCIUM 9.6  --  9.4 9.2   Liver Function Tests:  Recent Labs Lab 11/27/15 1212  AST 20  ALT 13*  ALKPHOS 94  BILITOT 0.1*  PROT 7.3  ALBUMIN 3.5   No results for input(s): LIPASE, AMYLASE in the last 168 hours. No results for input(s): AMMONIA in the last 168 hours. CBC:  Recent Labs Lab 11/27/15 1212 11/27/15 1227 11/28/15 0029 11/29/15 0245  WBC 11.1*  --  9.4 5.9  NEUTROABS 7.4  --   --   --   HGB 12.1* 13.9 10.1* 9.8*  HCT 37.6* 41.0 30.8* 29.9*  MCV 95.2  --  91.7 90.9  PLT 382  --  308 317   Cardiac Enzymes:  Recent Labs Lab 11/27/15 1850 11/28/15 0029 11/28/15 0710  TROPONINI 0.04* 0.09* 0.07*   BNP (last 3 results)  Recent Labs  03/29/15 0422 10/31/15  1350 11/27/15 1212  BNP 327.5* 382.1* 479.5*    ProBNP (last 3 results) No results for input(s): PROBNP in the last 8760 hours.  CBG: No results for input(s): GLUCAP in the last 168 hours.  Recent Results (from the past 240 hour(s))  Blood Culture (routine x 2)     Status: None (Preliminary result)   Collection Time: 11/27/15 12:12 PM  Result Value Ref Range Status   Specimen Description BLOOD LEFT FOREARM  Final   Special Requests BOTTLES DRAWN AEROBIC AND ANAEROBIC 5CCS  Final   Culture NO GROWTH 2 DAYS  Final   Report Status PENDING  Incomplete  Blood Culture (routine x 2)     Status: None (Preliminary result)   Collection Time: 11/27/15 12:22 PM  Result  Value Ref Range Status   Specimen Description BLOOD RIGHT HAND  Final   Special Requests BOTTLES DRAWN AEROBIC ONLY 3MLS  Final   Culture NO GROWTH 2 DAYS  Final   Report Status PENDING  Incomplete  MRSA PCR Screening     Status: Abnormal   Collection Time: 11/27/15  5:44 PM  Result Value Ref Range Status   MRSA by PCR POSITIVE (A) NEGATIVE Final    Comment:        The GeneXpert MRSA Assay (FDA approved for NASAL specimens only), is one component of a comprehensive MRSA colonization surveillance program. It is not intended to diagnose MRSA infection nor to guide or monitor treatment for MRSA infections. RESULT CALLED TO, READ BACK BY AND VERIFIED WITH: L.SHORT,RN AT 2055 BY L.PITT 11/27/15   Urine culture     Status: None   Collection Time: 11/28/15 12:37 PM  Result Value Ref Range Status   Specimen Description URINE, CLEAN CATCH  Final   Special Requests NONE  Final   Culture NO GROWTH 1 DAY  Final   Report Status 11/29/2015 FINAL  Final     Studies: Dg Chest Port 1 View  11/27/2015  CLINICAL DATA:  Hypoxia.  Fever. EXAM: PORTABLE CHEST 1 VIEW COMPARISON:  10/31/2015. FINDINGS: Stable enlarged cardiac silhouette and tortuous, partially calcified thoracic aorta. Interval patchy opacity in the right upper lobe. Unremarkable bones. IMPRESSION: Right upper lobe pneumonia. Electronically Signed   By: Claudie Revering M.D.   On: 11/27/2015 12:38    Scheduled Meds: . antiseptic oral rinse  7 mL Mouth Rinse BID  . arformoterol  15 mcg Nebulization BID  . budesonide (PULMICORT) nebulizer solution  0.5 mg Nebulization BID  . ceFEPime (MAXIPIME) IV  2 g Intravenous Q12H  . Chlorhexidine Gluconate Cloth  6 each Topical Q0600  . enoxaparin (LOVENOX) injection  60 mg Subcutaneous Q12H  . ipratropium-albuterol  3 mL Nebulization QID  . levothyroxine  12.5 mcg Intravenous Daily  . methylPREDNISolone (SOLU-MEDROL) injection  60 mg Intravenous Daily  . metronidazole  500 mg Intravenous Q8H   . mupirocin ointment  1 application Nasal BID  . vancomycin  500 mg Intravenous Q12H   Continuous Infusions: . dextrose 5 % and 0.45% NaCl 50 mL/hr at 11/29/15 0600   Antibiotics Given (last 72 hours)    Date/Time Action Medication Dose Rate   11/27/15 1941 Given   metroNIDAZOLE (FLAGYL) IVPB 500 mg 500 mg 100 mL/hr   11/28/15 0037 Given   ceFEPIme (MAXIPIME) 2 g in dextrose 5 % 50 mL IVPB 2 g 100 mL/hr   11/28/15 0300 Given   vancomycin (VANCOCIN) 500 mg in sodium chloride 0.9 % 100 mL IVPB 500 mg 100 mL/hr  11/28/15 0300 Given   metroNIDAZOLE (FLAGYL) IVPB 500 mg 500 mg 100 mL/hr   11/28/15 1016 Given   metroNIDAZOLE (FLAGYL) IVPB 500 mg 500 mg 100 mL/hr   11/28/15 1331 Given   ceFEPIme (MAXIPIME) 2 g in dextrose 5 % 50 mL IVPB 2 g 100 mL/hr   11/28/15 1441 Given   vancomycin (VANCOCIN) 500 mg in sodium chloride 0.9 % 100 mL IVPB 500 mg 100 mL/hr   11/28/15 1654 Given   metroNIDAZOLE (FLAGYL) IVPB 500 mg 500 mg 100 mL/hr   11/29/15 0018 Given   ceFEPIme (MAXIPIME) 2 g in dextrose 5 % 50 mL IVPB 2 g 100 mL/hr   11/29/15 0108 Given   vancomycin (VANCOCIN) 500 mg in sodium chloride 0.9 % 100 mL IVPB 500 mg 100 mL/hr   11/29/15 0150 Given   metroNIDAZOLE (FLAGYL) IVPB 500 mg 500 mg 100 mL/hr   11/29/15 1002 Given   metroNIDAZOLE (FLAGYL) IVPB 500 mg 500 mg 100 mL/hr      Principal Problem:   HCAP (healthcare-associated pneumonia) Active Problems:   Acute respiratory failure with hypoxia (H[facility])   Chronic diastolic CHF (congestive heart failure) (H[facility])   Hypothyroidism   Protein-calorie malnutrition, severe (H[facility])   COPD exacerbation ([place])   COPD (chronic obstructive pulmonary disease) ([place])   Dysphagia   DNR (do not resuscitate)   Pressure ulcer    Time spent: 35 min    [place] [facility] Pager [phone]. If 7PM-7AM, please contact night-coverage at [email or web address], password [facility] 11/29/2015, 12:02 PM  LOS: 2 days

## 2015-11-29 NOTE — Progress Notes (Signed)
Pt's lunch tray arrived, pt repositioned in the bed upright, aspiration precautions maintained. No coughing noted from patient. Will continue to monitor.

## 2015-11-29 NOTE — Progress Notes (Signed)
Spoke with pt's daughter Neoma Laming. Neoma Laming explained she would still like to allow her father to eat and understands it is a high risk of aspiration. MD Eliseo Squires and NP [place] notified and aware. Diet Dys 3 with honey thick liquids ordered. Pt's lunch tray ordered. Will continue to monitor.

## 2015-11-30 DIAGNOSIS — J441 Chronic obstructive pulmonary disease with (acute) exacerbation: Secondary | ICD-10-CM

## 2015-11-30 LAB — BASIC METABOLIC PANEL
ANION GAP: 7 (ref 5–15)
BUN: 24 mg/dL — ABNORMAL HIGH (ref 6–20)
CHLORIDE: 106 mmol/L (ref 101–111)
CO2: 26 mmol/L (ref 22–32)
Calcium: 9 mg/dL (ref 8.9–10.3)
Creatinine, Ser: 0.92 mg/dL (ref 0.61–1.24)
GFR calc non Af Amer: >60 mL/min (ref >60)
GLUCOSE: 145 mg/dL — AB (ref 65–99)
POTASSIUM: 3.5 mmol/L (ref 3.5–5.1)
Sodium: 139 mmol/L (ref 135–145)

## 2015-11-30 LAB — CBC
HEMATOCRIT: 27.8 % — AB (ref 39.0–52.0)
HEMOGLOBIN: 9.1 g/dL — AB (ref 13.0–17.0)
MCH: 29.6 pg (ref 26.0–34.0)
MCHC: 32.7 g/dL (ref 30.0–36.0)
MCV: 90.6 fL (ref 78.0–100.0)
Platelets: 305 10*3/uL (ref 150–400)
RBC: 3.07 MIL/uL — AB (ref 4.22–5.81)
RDW: 15.3 % (ref 11.5–15.5)
WBC: 7.8 10*3/uL (ref 4.0–10.5)

## 2015-11-30 LAB — VANCOMYCIN, TROUGH: VANCOMYCIN TR: 14 ug/mL (ref 10.0–20.0)

## 2015-11-30 LAB — EXPECTORATED SPUTUM ASSESSMENT W REFEX TO RESP CULTURE

## 2015-11-30 LAB — EXPECTORATED SPUTUM ASSESSMENT W GRAM STAIN, RFLX TO RESP C

## 2015-11-30 MED ORDER — IPRATROPIUM-ALBUTEROL 0.5-2.5 (3) MG/3ML IN SOLN
3.0000 mL | Freq: Three times a day (TID) | RESPIRATORY_TRACT | Status: DC
  Administered 2015-11-30 – 2015-12-02 (×6): 3 mL via RESPIRATORY_TRACT
  Filled 2015-11-30 (×7): qty 3

## 2015-11-30 MED ORDER — METHYLPREDNISOLONE SODIUM SUCC 40 MG IJ SOLR
40.0000 mg | Freq: Every day | INTRAMUSCULAR | Status: DC
  Administered 2015-12-01 – 2015-12-02 (×2): 40 mg via INTRAVENOUS
  Filled 2015-11-30 (×2): qty 1

## 2015-11-30 MED ORDER — LEVOTHYROXINE SODIUM 25 MCG PO TABS
25.0000 ug | ORAL_TABLET | Freq: Every day | ORAL | Status: DC
  Administered 2015-12-01 – 2015-12-03 (×3): 25 ug via ORAL
  Filled 2015-11-30 (×4): qty 1

## 2015-11-30 NOTE — Progress Notes (Addendum)
ANTICOAGULATION CONSULT NOTE - Initial Consult  Pharmacy Consult for Lovenox/vancomycin and cefepime Indication: atrial fibrillation/HCAP  No Known Allergies  Patient Measurements: Weight = 60.5kg  Vital Signs: Temp: 98.7 F (37.1 C) (01/29 0400) Temp Source: Oral (01/29 0400) BP: 174/79 mmHg (01/29 0600) Pulse Rate: 51 (01/29 0600)  Labs:  Recent Labs  11/27/15 1850 11/28/15 0029 11/28/15 0710 11/29/15 0245 11/30/15 0343  HGB  --  10.1*  --  9.8* 9.1*  HCT  --  30.8*  --  29.9* 27.8*  PLT  --  308  --  317 305  CREATININE  --  0.92  --  0.87 0.92  TROPONINI 0.04* 0.09* 0.07*  --   --     Estimated Creatinine Clearance: 50.4 mL/min (by C-G formula based on Cr of 0.92).   Medical History: Past Medical History  Diagnosis Date  . Asthma   . Hypertension   . A-fib ([place])   . Hyperlipidemia   . Shortness of breath   . CHF (congestive heart failure) ([place]) 10/28/2011  . Forgetfulness   . PVC's (premature ventricular contractions)   . Mild aortic insufficiency   . Dementia     [name] 02/05/2015  . Failure to thrive in adult     /notes 02/05/2015  . COPD (chronic obstructive pulmonary disease) ([place])   . Hypothyroidism     [name] 02/05/2015  . Fall 02/04/2015    with altered mental status off of his baseline after he suffered a mechanical fall /notes 02/04/2015  . CAP (community acquired pneumonia)     [name] 02/05/2015  . Colon cancer [facility])    Assessment:  Anticoagulation: 89yom on xarelto pta for afib (CHADSVASC = 3) being admitted with acute respiratory failure. He is NPO due to aspiration, on full dose lovenox while holding xarelto. CBC, scr stable. No bleeding noted per chart.  ID: Abx day #4 for sepsis, LA 5.3 >>1.2, afebrile, WBC 11.1 >> 7.8, was started on Augmentin 1/25 (outpt)  Vanc 1/26 >> Cefepime 1/26 >> Flagyl 1/26 >>  1/26 MRSA PCR + 1/26 blood x 2 - 1/26 Flu - neg   Goal of Therapy:  Vancomycin trough 15-20 mcg/ml Anti-Xa level 0.6-1 units/ml  4hrs after LMWH dose given Monitor platelets by anticoagulation protocol: Yes   Plan:  -Vancomcyin 1250 mg IV x1 then 500 mg IV q12h -Cefepime 2 g IV q12h  -Monitor renal fx, cultures -Lovenox 60mg  sq q12 -VT today [place], PharmD, BCPS  Clinical Pharmacist  Pager: [phone]   11/30/2015,7:47 AM  Addendum: Vancomycin trough 14, acceptable. Will continue current dose.   Maryanna Shape, PharmD, BCPS  Clinical Pharmacist  Pager: [phone]

## 2015-11-30 NOTE — Progress Notes (Signed)
PROGRESS NOTE  Todd [name] [id number] DOB: 1924/10/05 DOA: 11/27/2015 PCP: Odette Fraction, MD  Assessment/Plan: Acute hypoxic respiratory failure and sepsis with lactic acidosis due to HCAP versus aspiration pneumonia - Sputum culture if able - F/u blood cultures - Vancomycin and cefepime plus Flagyl - not able to tolerate Bipap - Flu PCR negative - NPO- SLP saw: During prior admit SLP discussed severity of impairment and family decided to allow pt to consume diet with known risk-- will start comfort feeds   COPD with acute exacerbation,  - start solumedrol daily  - Start duonebs - Antibiotics as above  Chronic diastolic heart failure - patient appears euvolemic, no LEE - Daily weights and strict I/O  Chronic atrial fibrillation - patient's CHA2DS2-VASc Score for Stroke Risk is 3, hold Xarelto and all oral medications  Hypothyroidism - continue Synthroid IV for now  Severe protein caloric malnutrition, NPO pending improvement in breathing/swallowing  Iron deficiency anemia, hold oral iron for now  Mild hyperglycemia, likely due to steroids and stress - Consider starting SSI if persistent  Hyperkalemia resolved with IVF  Leukocytosis is only mild and may be related to dehydration or sepsis  Poor overall prognosis  Code Status:DNR Family Communication: spoke with daughter on phone Disposition Plan: SNF- suspect patient will continue to hospital and continue to aspirate   Consultants:  Palliative care  Procedures:      HPI/Subjective: Hard of hearing but will answer questions  Objective: Filed Vitals:   11/30/15 0600 11/30/15 0800  BP: 174/79 157/83  Pulse: 51 102  Temp:  98.4 F (36.9 C)  Resp: 28 23    Intake/Output Summary (Last 24 hours) at 11/30/15 1111 Last data filed at 11/30/15 0800  Gross per 24 hour  Intake    860 ml  Output    850 ml  Net     10 ml   Filed Weights   11/28/15 0500 11/29/15 0500 11/30/15 0555    Weight: 62.8 kg (138 lb 7.2 oz) 62 kg (136 lb 11 oz) 65.4 kg (144 lb 2.9 oz)    Exam:   General:  Will awaken, hard of hearing  Cardiovascular: rrr, no longer tachy  Respiratory: clearer lung sounds today  Abdomen: +Bs, soft  Musculoskeletal: no edema   Data Reviewed: Basic Metabolic Panel:  Recent Labs Lab 11/27/15 1212 11/27/15 1227 11/28/15 0029 11/29/15 0245 11/30/15 0343  NA 142 142 143 142 139  K 5.2* 4.9 4.6 3.8 3.5  CL 107 106 106 108 106  CO2 22  --  25 25 26   GLUCOSE 161* 157* 127* 144* 145*  BUN 16 20 18  21* 24*  CREATININE 0.91 0.80 0.92 0.87 0.92  CALCIUM 9.6  --  9.4 9.2 9.0   Liver Function Tests:  Recent Labs Lab 11/27/15 1212  AST 20  ALT 13*  ALKPHOS 94  BILITOT 0.1*  PROT 7.3  ALBUMIN 3.5   No results for input(s): LIPASE, AMYLASE in the last 168 hours. No results for input(s): AMMONIA in the last 168 hours. CBC:  Recent Labs Lab 11/27/15 1212 11/27/15 1227 11/28/15 0029 11/29/15 0245 11/30/15 0343  WBC 11.1*  --  9.4 5.9 7.8  NEUTROABS 7.4  --   --   --   --   HGB 12.1* 13.9 10.1* 9.8* 9.1*  HCT 37.6* 41.0 30.8* 29.9* 27.8*  MCV 95.2  --  91.7 90.9 90.6  PLT 382  --  308 317 305   Cardiac Enzymes:  Recent Labs Lab  11/27/15 1850 11/28/15 0029 11/28/15 0710  TROPONINI 0.04* 0.09* 0.07*   BNP (last 3 results)  Recent Labs  03/29/15 0422 10/31/15 1350 11/27/15 1212  BNP 327.5* 382.1* 479.5*    ProBNP (last 3 results) No results for input(s): PROBNP in the last 8760 hours.  CBG: No results for input(s): GLUCAP in the last 168 hours.  Recent Results (from the past 240 hour(s))  Blood Culture (routine x 2)     Status: None (Preliminary result)   Collection Time: 11/27/15 12:12 PM  Result Value Ref Range Status   Specimen Description BLOOD LEFT FOREARM  Final   Special Requests BOTTLES DRAWN AEROBIC AND ANAEROBIC 5CCS  Final   Culture NO GROWTH 2 DAYS  Final   Report Status PENDING  Incomplete  Blood  Culture (routine x 2)     Status: None (Preliminary result)   Collection Time: 11/27/15 12:22 PM  Result Value Ref Range Status   Specimen Description BLOOD RIGHT HAND  Final   Special Requests BOTTLES DRAWN AEROBIC ONLY 3MLS  Final   Culture NO GROWTH 2 DAYS  Final   Report Status PENDING  Incomplete  MRSA PCR Screening     Status: Abnormal   Collection Time: 11/27/15  5:44 PM  Result Value Ref Range Status   MRSA by PCR POSITIVE (A) NEGATIVE Final    Comment:        The GeneXpert MRSA Assay (FDA approved for NASAL specimens only), is one component of a comprehensive MRSA colonization surveillance program. It is not intended to diagnose MRSA infection nor to guide or monitor treatment for MRSA infections. RESULT CALLED TO, READ BACK BY AND VERIFIED WITH: L.SHORT,RN AT 2055 BY L.PITT 11/27/15   Urine culture     Status: None   Collection Time: 11/28/15 12:37 PM  Result Value Ref Range Status   Specimen Description URINE, CLEAN CATCH  Final   Special Requests NONE  Final   Culture NO GROWTH 1 DAY  Final   Report Status 11/29/2015 FINAL  Final     Studies: No results found.  Scheduled Meds: . antiseptic oral rinse  7 mL Mouth Rinse BID  . arformoterol  15 mcg Nebulization BID  . budesonide (PULMICORT) nebulizer solution  0.5 mg Nebulization BID  . ceFEPime (MAXIPIME) IV  2 g Intravenous Q12H  . Chlorhexidine Gluconate Cloth  6 each Topical Q0600  . enoxaparin (LOVENOX) injection  60 mg Subcutaneous Q12H  . ipratropium-albuterol  3 mL Nebulization QID  . [START ON 12/01/2015] levothyroxine  25 mcg Oral QAC breakfast  . [START ON 12/01/2015] methylPREDNISolone (SOLU-MEDROL) injection  40 mg Intravenous Daily  . metronidazole  500 mg Intravenous Q8H  . mupirocin ointment  1 application Nasal BID  . vancomycin  500 mg Intravenous Q12H   Continuous Infusions:   Antibiotics Given (last 72 hours)    Date/Time Action Medication Dose Rate   11/27/15 1941 Given   metroNIDAZOLE  (FLAGYL) IVPB 500 mg 500 mg 100 mL/hr   11/28/15 0037 Given   ceFEPIme (MAXIPIME) 2 g in dextrose 5 % 50 mL IVPB 2 g 100 mL/hr   11/28/15 0300 Given   vancomycin (VANCOCIN) 500 mg in sodium chloride 0.9 % 100 mL IVPB 500 mg 100 mL/hr   11/28/15 0300 Given   metroNIDAZOLE (FLAGYL) IVPB 500 mg 500 mg 100 mL/hr   11/28/15 1016 Given   metroNIDAZOLE (FLAGYL) IVPB 500 mg 500 mg 100 mL/hr   11/28/15 1331 Given   ceFEPIme (MAXIPIME) 2  g in dextrose 5 % 50 mL IVPB 2 g 100 mL/hr   11/28/15 1441 Given   vancomycin (VANCOCIN) 500 mg in sodium chloride 0.9 % 100 mL IVPB 500 mg 100 mL/hr   11/28/15 1654 Given   metroNIDAZOLE (FLAGYL) IVPB 500 mg 500 mg 100 mL/hr   11/29/15 0018 Given   ceFEPIme (MAXIPIME) 2 g in dextrose 5 % 50 mL IVPB 2 g 100 mL/hr   11/29/15 0108 Given   vancomycin (VANCOCIN) 500 mg in sodium chloride 0.9 % 100 mL IVPB 500 mg 100 mL/hr   11/29/15 0150 Given   metroNIDAZOLE (FLAGYL) IVPB 500 mg 500 mg 100 mL/hr   11/29/15 1002 Given   metroNIDAZOLE (FLAGYL) IVPB 500 mg 500 mg 100 mL/hr   11/29/15 1412 Given   ceFEPIme (MAXIPIME) 2 g in dextrose 5 % 50 mL IVPB 2 g 100 mL/hr   11/29/15 1412 Given   vancomycin (VANCOCIN) 500 mg in sodium chloride 0.9 % 100 mL IVPB 500 mg 100 mL/hr   11/29/15 1709 Given   metroNIDAZOLE (FLAGYL) IVPB 500 mg 500 mg 100 mL/hr   11/30/15 0210 Given   ceFEPIme (MAXIPIME) 2 g in dextrose 5 % 50 mL IVPB 2 g 100 mL/hr   11/30/15 0301 Given   vancomycin (VANCOCIN) 500 mg in sodium chloride 0.9 % 100 mL IVPB 500 mg 100 mL/hr   11/30/15 0301 Given   metroNIDAZOLE (FLAGYL) IVPB 500 mg 500 mg 100 mL/hr   11/30/15 0946 Given   metroNIDAZOLE (FLAGYL) IVPB 500 mg 500 mg 100 mL/hr      Principal Problem:   HCAP (healthcare-associated pneumonia) Active Problems:   Acute respiratory failure with hypoxia (HCC)   Chronic diastolic CHF (congestive heart failure) (HCC)   Hypothyroidism   Protein-calorie malnutrition, severe ([place])   COPD exacerbation ([place])    COPD (chronic obstructive pulmonary disease) ([place])   Dysphagia   DNR (do not resuscitate)   Pressure ulcer    Time spent: 35 min    [facility] Pager [phone]. If 7PM-7AM, please contact night-coverage at [email or web address], password [facility] 11/30/2015, 11:11 AM  LOS: 3 days

## 2015-11-30 NOTE — NC FL2 (Signed)
[place] LEVEL OF CARE SCREENING TOOL     IDENTIFICATION  Patient Name: Todd [name] Birthdate: [birth year] Sex: male Admission Date (Current Location): 11/27/2015  [facility] and [place] Number:  Herbalist and Address:  [facility], [place] [address], [place], [place] [address]      Provider Number: [id number]  Attending Physician Name and Address:  Geradine Girt, DO  Relative Name and Phone Number:       Current Level of Care: Hospital Recommended Level of Care: [place] Prior Approval Number:    Date Approved/Denied:   PASRR Number:    Discharge Plan: Home    Current Diagnoses: Patient Active Problem List   Diagnosis Date Noted  . Pressure ulcer 11/28/2015  . Malnutrition of moderate degree 11/02/2015  . Febrile illness, acute 10/31/2015  . Inflammatory arthritis ([place]) 06/14/2015  . Blistering rash 05/11/2015  . Conjunctivitis 05/11/2015  . Compression fracture of T12 vertebra ([place]) 04/07/2015  . Palliative care encounter 04/02/2015  . Dyspnea 04/02/2015  . Dysphagia 04/02/2015  . DNR (do not resuscitate) 04/02/2015  . COPD (chronic obstructive pulmonary disease) ([place]) 03/29/2015  . NSVT (nonsustained ventricular tachycardia) ([place]) 03/29/2015  . COPD exacerbation ([place]) 03/13/2015  . Anemia, iron deficiency 03/13/2015  . HCAP (healthcare-associated pneumonia) 03/08/2015  . Acute encephalopathy 03/08/2015  . Protein-calorie malnutrition, severe ([place]) 02/06/2015  . Weakness 02/05/2015  . CAP (community acquired pneumonia) 02/05/2015  . Failure to thrive in adult 02/05/2015  . Fall 02/05/2015  . Pancreatitis, gallstone   . Cholecystitis, acute   . Elevated LFTs   . Preoperative cardiovascular examination   . Acute pancreatitis   . Pancreatitis 09/05/2014  . Hypothyroidism 09/05/2014  . Gallstone pancreatitis 11/23/2013  . Chronic diastolic CHF (congestive heart failure) ([place]) 10/28/2011  .  Pneumonia 09/06/2011  . Sepsis ([place]) 09/06/2011  . Chest pain 09/06/2011  . Chronic atrial fibrillation ([place]) 09/06/2011  . Acute respiratory failure with hypoxia ([place]) 09/06/2011  . PNEUMONIA, ORGANISM UNSPECIFIED 03/16/2010  . EDEMA 06/17/2008  . HLD (hyperlipidemia) 01/24/2008  . ALLERGIC RHINITIS 01/24/2008  . Asthma 01/24/2008  . BRONCHIECTASIS 01/24/2008  . Nonspecific (abnormal) findings on radiological and other examination of body structure 01/24/2008  . COLON CANCER 01/24/2008  . CHEST XRAY, ABNORMAL 01/24/2008    Orientation RESPIRATION BLADDER Height & Weight    Self  O2 (4L) Incontinent (condom cath)   144 lbs.  BEHAVIORAL SYMPTOMS/MOOD NEUROLOGICAL BOWEL NUTRITION STATUS      Incontinent  (dysphagia 3, honey-thick liquids)  AMBULATORY STATUS COMMUNICATION OF NEEDS Skin   Extensive Assist Verbally PU Stage and Appropriate Care PU Stage 1 Dressing: Daily                     Personal Care Assistance Level of Assistance  Bathing, Dressing Bathing Assistance: Maximum assistance   Dressing Assistance: Maximum assistance     Functional Limitations Info             SPECIAL CARE FACTORS FREQUENCY                       Contractures Contractures Info: Not present    Additional Factors Info  Code Status Code Status Info: DNR             Current Medications (11/30/2015):  This is the current hospital active medication list Current Facility-Administered Medications  Medication Dose Route Frequency Provider Last Rate Last Dose  .  antiseptic oral rinse (CPC / CETYLPYRIDINIUM CHLORIDE 0.05%) solution 7 mL  7 mL Mouth Rinse BID Jessica U Vann, DO   7 mL at 11/30/15 1000  . arformoterol (BROVANA) nebulizer solution 15 mcg  15 mcg Nebulization BID Janece Canterbury, MD   15 mcg at 11/30/15 1025  . budesonide (PULMICORT) nebulizer solution 0.5 mg  0.5 mg Nebulization BID Janece Canterbury, MD   0.5 mg at 11/30/15 1019  . ceFEPIme (MAXIPIME) 2 g in dextrose  5 % 50 mL IVPB  2 g Intravenous Q12H Jake Church Masters, RPH   2 g at 11/30/15 1227  . Chlorhexidine Gluconate Cloth 2 % PADS 6 each  6 each Topical Q0600 Geradine Girt, DO   6 each at 11/30/15 0600  . enoxaparin (LOVENOX) injection 60 mg  60 mg Subcutaneous Q12H Otilio Miu, RPH   60 mg at 11/30/15 [id number]  . hydrALAZINE (APRESOLINE) injection 10 mg  10 mg Intravenous Q6H PRN Geradine Girt, DO      . ipratropium-albuterol (DUONEB) 0.5-2.5 (3) MG/3ML nebulizer solution 3 mL  3 mL Nebulization Q4H PRN Janece Canterbury, MD      . ipratropium-albuterol (DUONEB) 0.5-2.5 (3) MG/3ML nebulizer solution 3 mL  3 mL Nebulization TID Geradine Girt, DO      . [START ON 12/01/2015] levothyroxine (SYNTHROID, LEVOTHROID) tablet 25 mcg  25 mcg Oral QAC breakfast Geradine Girt, DO      . [START ON 12/01/2015] methylPREDNISolone sodium succinate (SOLU-MEDROL) 40 mg/mL injection 40 mg  40 mg Intravenous Daily Jessica U Vann, DO      . metroNIDAZOLE (FLAGYL) IVPB 500 mg  500 mg Intravenous Q8H Janece Canterbury, MD   500 mg at 11/30/15 0946  . mupirocin ointment (BACTROBAN) 2 % 1 application  1 application Nasal BID Geradine Girt, DO   1 application at [id number]  . RESOURCE THICKENUP CLEAR   Oral PRN Geradine Girt, DO      . vancomycin (VANCOCIN) 500 mg in sodium chloride 0.9 % 100 mL IVPB  500 mg Intravenous Q12H Jake Church Masters, RPH   500 mg at 11/30/15 0301     Discharge Medications: Please see discharge summary for a list of discharge medications.  Relevant Imaging Results:  Relevant Lab Results:   Additional Information    Moshe Cipro Berneice Heinrich, LCSW

## 2015-11-30 NOTE — Progress Notes (Signed)
SLP Cancellation Note  Patient Details Name: Todd [name] MRN: [id number] DOB: 02/24/1924   Cancelled treatment:       Reason Eval/Treat Not Completed: Other (comment) Per chart review, palliative care note indicates that family wishes are to continue with comfort feeding at this time. SLP to s/o at this time as there does not appear to be any acute needs. Please re-order if we can be of further assistance.   Germain Osgood, M.A. CCC-SLP [phone]  Germain Osgood 11/30/2015, 8:32 AM

## 2015-12-01 LAB — BASIC METABOLIC PANEL
Anion gap: 8 (ref 5–15)
BUN: 23 mg/dL — AB (ref 6–20)
CHLORIDE: 108 mmol/L (ref 101–111)
CO2: 28 mmol/L (ref 22–32)
Calcium: 9 mg/dL (ref 8.9–10.3)
Creatinine, Ser: 0.91 mg/dL (ref 0.61–1.24)
GFR calc Af Amer: >60 mL/min (ref >60)
GFR calc non Af Amer: >60 mL/min (ref >60)
GLUCOSE: 125 mg/dL — AB (ref 65–99)
POTASSIUM: 3.4 mmol/L — AB (ref 3.5–5.1)
Sodium: 144 mmol/L (ref 135–145)

## 2015-12-01 LAB — CBC
HEMATOCRIT: 29.7 % — AB (ref 39.0–52.0)
HEMOGLOBIN: 9.8 g/dL — AB (ref 13.0–17.0)
MCH: 29.7 pg (ref 26.0–34.0)
MCHC: 33 g/dL (ref 30.0–36.0)
MCV: 90 fL (ref 78.0–100.0)
Platelets: 322 10*3/uL (ref 150–400)
RBC: 3.3 MIL/uL — ABNORMAL LOW (ref 4.22–5.81)
RDW: 15.2 % (ref 11.5–15.5)
WBC: 7.8 10*3/uL (ref 4.0–10.5)

## 2015-12-01 LAB — MAGNESIUM: Magnesium: 1.8 mg/dL (ref 1.7–2.4)

## 2015-12-01 MED ORDER — POTASSIUM CHLORIDE 10 MEQ/100ML IV SOLN
10.0000 meq | INTRAVENOUS | Status: AC
  Administered 2015-12-01 (×2): 10 meq via INTRAVENOUS
  Filled 2015-12-01 (×2): qty 100

## 2015-12-01 MED ORDER — AMOXICILLIN-POT CLAVULANATE 875-125 MG PO TABS
1.0000 | ORAL_TABLET | Freq: Two times a day (BID) | ORAL | Status: DC
  Administered 2015-12-01 – 2015-12-04 (×7): 1 via ORAL
  Filled 2015-12-01 (×8): qty 1

## 2015-12-01 NOTE — Progress Notes (Signed)
Nutrition Brief Note  Patient identified on the Malnutrition Screening Tool (MST) Report. Chart reviewed. Spoke with RN. Patient with a hx of continued severe malnutrition. Palliative Care Team following for goals of care. Plans to continue to treat the treatable and continue comfort feedings. He is currently receiving a dysphagia 3 diet and honey thick liquids. Daughter does not want patient to have a PEG or artificial feedings. No nutrition interventions warranted at this time. If nutrition issues arise, please consult RD.   Molli Barrows, RD, LDN, [place] Pager [phone] After Hours Pager [phone]

## 2015-12-01 NOTE — Progress Notes (Addendum)
Patient trasfered from 3S to 5W07 via bed; alert and oriented to person; no complaints of pain; IV saline locked in LFA and  right wrist; skin intact (redness on right elbow, blanchable; foam dressing on both heels and sacrum for protection). Tele monitor box 20 applied and CCMD notified.Orient patient to room and unit;instructed how to use the call bell and  fall risk precautions. Will continue to monitor the patient.

## 2015-12-01 NOTE — Care Management Note (Signed)
Case Management Note  Patient Details  Name: Todd [name] MRN: [id number] Date of Birth: 1924/01/06  Subjective/Objective:     Patient is from [facility],  Patient with aspiration pna, failed swallow,   Per Palliative consult daughter wants to continue with comfort feeds at snf, she does not want a peg tube for patient. Patient to  receive 2 runs of k today.    CSW aware of snf for dispo.            Action/Plan:   Expected Discharge Date:                  Expected Discharge Plan:  Skilled Nursing Facility  In-House Referral:  Clinical Social Work  Discharge planning Services  CM Consult  Post Acute Care Choice:    Choice offered to:     DME Arranged:    DME Agency:     HH Arranged:    [place] Agency:     Status of Service:  Completed, signed off  Medicare Important Message Given:  Yes Date Medicare IM Given:    Medicare IM give by:    Date Additional Medicare IM Given:    Additional Medicare Important Message give by:     If discussed at [place] of Stay Meetings, dates discussed:    Additional Comments:  Zenon Mayo, RN 12/01/2015, 11:22 AM

## 2015-12-01 NOTE — Care Management Important Message (Signed)
Important Message  Patient Details  Name: JEARLD ROMBERG MRN: [id number] Date of Birth: 08-23-1924   Medicare Important Message Given:  Yes    Zenon Mayo, RN 12/01/2015, 11:21 AMImportant Message  Patient Details  Name: MAXIE KLEMP MRN: [id number] Date of Birth: 12-19-1923   Medicare Important Message Given:  Yes    Zenon Mayo, RN 12/01/2015, 11:21 AM

## 2015-12-01 NOTE — Progress Notes (Addendum)
PROGRESS NOTE  Todd [name] [id number] DOB: May 10, 1924 DOA: 11/27/2015 PCP: Odette Fraction, MD  Assessment/Plan: Acute hypoxic respiratory failure and sepsis with lactic acidosis due to HCAP versus aspiration pneumonia - Sputum culture if able - F/u blood cultures - Vancomycin and cefepime plus Flagyl- change to augmentin - not able to tolerate Bipap - Flu PCR negative - NPO- SLP saw: During prior admit SLP discussed severity of impairment and family decided to allow pt to consume diet with known risk-- will start comfort feeds   COPD with acute exacerbation,  - wean solumedrol - Start duonebs - Antibiotics as above  Chronic diastolic heart failure - patient appears euvolemic, no LEE - Daily weights and strict I/O  Chronic atrial fibrillation - patient's CHA2DS2-VASc Score for Stroke Risk is 3, hold Xarelto and all oral medications  Hypothyroidism - continue Synthroid IV for now  Severe protein caloric malnutrition, NPO pending improvement in breathing/swallowing  Iron deficiency anemia, hold oral iron for now  Mild hyperglycemia, likely due to steroids and stress - Consider starting SSI if persistent  Hyperkalemia resolved with IVF  Leukocytosis is only mild and may be related to dehydration or sepsis  Poor overall prognosis  Code Status:DNR Family Communication: spoke with daughter on phone Disposition Plan: SNF- suspect patient will continue to hospital and continue to aspirate   Consultants:  Palliative care  Procedures:      HPI/Subjective: Strong cough Eating well when fed  Objective: Filed Vitals:   12/01/15 0811 12/01/15 1054  BP:  150/71  Pulse: 51 67  Temp: 97.5 F (36.4 C)   Resp: 16 29    Intake/Output Summary (Last 24 hours) at 12/01/15 1335 Last data filed at 12/01/15 0800  Gross per 24 hour  Intake    120 ml  Output   1300 ml  Net  -1180 ml   Filed Weights   11/29/15 0500 11/30/15 0555 12/01/15 0442    Weight: 62 kg (136 lb 11 oz) 65.4 kg (144 lb 2.9 oz) 65.772 kg (145 lb)    Exam:   General:  Will awaken, hard of hearing  Cardiovascular: rrr, no longer tachy  Respiratory: clearer lung sounds today  Abdomen: +Bs, soft  Musculoskeletal: no edema   Data Reviewed: Basic Metabolic Panel:  Recent Labs Lab 11/27/15 1212 11/27/15 1227 11/28/15 0029 11/29/15 0245 11/30/15 0343 12/01/15 0334  NA 142 142 143 142 139 144  K 5.2* 4.9 4.6 3.8 3.5 3.4*  CL 107 106 106 108 106 108  CO2 22  --  25 25 26 28   GLUCOSE 161* 157* 127* 144* 145* 125*  BUN 16 20 18  21* 24* 23*  CREATININE 0.91 0.80 0.92 0.87 0.92 0.91  CALCIUM 9.6  --  9.4 9.2 9.0 9.0  MG  --   --   --   --   --  1.8   Liver Function Tests:  Recent Labs Lab 11/27/15 1212  AST 20  ALT 13*  ALKPHOS 94  BILITOT 0.1*  PROT 7.3  ALBUMIN 3.5   No results for input(s): LIPASE, AMYLASE in the last 168 hours. No results for input(s): AMMONIA in the last 168 hours. CBC:  Recent Labs Lab 11/27/15 1212 11/27/15 1227 11/28/15 0029 11/29/15 0245 11/30/15 0343 12/01/15 0334  WBC 11.1*  --  9.4 5.9 7.8 7.8  NEUTROABS 7.4  --   --   --   --   --   HGB 12.1* 13.9 10.1* 9.8* 9.1* 9.8*  HCT 37.6* 41.0  30.8* 29.9* 27.8* 29.7*  MCV 95.2  --  91.7 90.9 90.6 90.0  PLT 382  --  308 317 305 322   Cardiac Enzymes:  Recent Labs Lab 11/27/15 1850 11/28/15 0029 11/28/15 0710  TROPONINI 0.04* 0.09* 0.07*   BNP (last 3 results)  Recent Labs  03/29/15 0422 10/31/15 1350 11/27/15 1212  BNP 327.5* 382.1* 479.5*    ProBNP (last 3 results) No results for input(s): PROBNP in the last 8760 hours.  CBG: No results for input(s): GLUCAP in the last 168 hours.  Recent Results (from the past 240 hour(s))  Blood Culture (routine x 2)     Status: None (Preliminary result)   Collection Time: 11/27/15 12:12 PM  Result Value Ref Range Status   Specimen Description BLOOD LEFT FOREARM  Final   Special Requests BOTTLES  DRAWN AEROBIC AND ANAEROBIC 5CCS  Final   Culture NO GROWTH 3 DAYS  Final   Report Status PENDING  Incomplete  Blood Culture (routine x 2)     Status: None (Preliminary result)   Collection Time: 11/27/15 12:22 PM  Result Value Ref Range Status   Specimen Description BLOOD RIGHT HAND  Final   Special Requests BOTTLES DRAWN AEROBIC ONLY 3MLS  Final   Culture NO GROWTH 3 DAYS  Final   Report Status PENDING  Incomplete  MRSA PCR Screening     Status: Abnormal   Collection Time: 11/27/15  5:44 PM  Result Value Ref Range Status   MRSA by PCR POSITIVE (A) NEGATIVE Final    Comment:        The GeneXpert MRSA Assay (FDA approved for NASAL specimens only), is one component of a comprehensive MRSA colonization surveillance program. It is not intended to diagnose MRSA infection nor to guide or monitor treatment for MRSA infections. RESULT CALLED TO, READ BACK BY AND VERIFIED WITH: L.SHORT,RN AT 2055 BY L.PITT 11/27/15   Urine culture     Status: None   Collection Time: 11/28/15 12:37 PM  Result Value Ref Range Status   Specimen Description URINE, CLEAN CATCH  Final   Special Requests NONE  Final   Culture NO GROWTH 1 DAY  Final   Report Status 11/29/2015 FINAL  Final  Culture, expectorated sputum-assessment     Status: None   Collection Time: 11/30/15  1:34 PM  Result Value Ref Range Status   Specimen Description SPUTUM  Final   Special Requests NONE  Final   Sputum evaluation   Final    THIS SPECIMEN IS ACCEPTABLE. RESPIRATORY CULTURE REPORT TO FOLLOW.   Report Status 11/30/2015 FINAL  Final     Studies: No results found.  Scheduled Meds: . amoxicillin-clavulanate  1 tablet Oral Q12H  . antiseptic oral rinse  7 mL Mouth Rinse BID  . arformoterol  15 mcg Nebulization BID  . budesonide (PULMICORT) nebulizer solution  0.5 mg Nebulization BID  . Chlorhexidine Gluconate Cloth  6 each Topical Q0600  . enoxaparin (LOVENOX) injection  60 mg Subcutaneous Q12H  .  ipratropium-albuterol  3 mL Nebulization TID  . levothyroxine  25 mcg Oral QAC breakfast  . methylPREDNISolone (SOLU-MEDROL) injection  40 mg Intravenous Daily  . mupirocin ointment  1 application Nasal BID   Continuous Infusions:   Antibiotics Given (last 72 hours)    Date/Time Action Medication Dose Rate   11/28/15 1441 Given   vancomycin (VANCOCIN) 500 mg in sodium chloride 0.9 % 100 mL IVPB 500 mg 100 mL/hr   11/28/15 1654 Given  metroNIDAZOLE (FLAGYL) IVPB 500 mg 500 mg 100 mL/hr   11/29/15 0018 Given   ceFEPIme (MAXIPIME) 2 g in dextrose 5 % 50 mL IVPB 2 g 100 mL/hr   11/29/15 0108 Given   vancomycin (VANCOCIN) 500 mg in sodium chloride 0.9 % 100 mL IVPB 500 mg 100 mL/hr   11/29/15 0150 Given   metroNIDAZOLE (FLAGYL) IVPB 500 mg 500 mg 100 mL/hr   11/29/15 1002 Given   metroNIDAZOLE (FLAGYL) IVPB 500 mg 500 mg 100 mL/hr   11/29/15 1412 Given   ceFEPIme (MAXIPIME) 2 g in dextrose 5 % 50 mL IVPB 2 g 100 mL/hr   11/29/15 1412 Given   vancomycin (VANCOCIN) 500 mg in sodium chloride 0.9 % 100 mL IVPB 500 mg 100 mL/hr   11/29/15 1709 Given   metroNIDAZOLE (FLAGYL) IVPB 500 mg 500 mg 100 mL/hr   11/30/15 0210 Given   ceFEPIme (MAXIPIME) 2 g in dextrose 5 % 50 mL IVPB 2 g 100 mL/hr   11/30/15 0301 Given   vancomycin (VANCOCIN) 500 mg in sodium chloride 0.9 % 100 mL IVPB 500 mg 100 mL/hr   11/30/15 0301 Given   metroNIDAZOLE (FLAGYL) IVPB 500 mg 500 mg 100 mL/hr   11/30/15 0946 Given   metroNIDAZOLE (FLAGYL) IVPB 500 mg 500 mg 100 mL/hr   11/30/15 1227 Given   ceFEPIme (MAXIPIME) 2 g in dextrose 5 % 50 mL IVPB 2 g 100 mL/hr   11/30/15 1611 Given   vancomycin (VANCOCIN) 500 mg in sodium chloride 0.9 % 100 mL IVPB 500 mg 100 mL/hr   11/30/15 1801 Given   metroNIDAZOLE (FLAGYL) IVPB 500 mg 500 mg 100 mL/hr   12/01/15 0030 Given   ceFEPIme (MAXIPIME) 2 g in dextrose 5 % 50 mL IVPB 2 g 100 mL/hr   12/01/15 0200 Given   vancomycin (VANCOCIN) 500 mg in sodium chloride 0.9 % 100 mL  IVPB 500 mg 100 mL/hr   12/01/15 0200 Given   metroNIDAZOLE (FLAGYL) IVPB 500 mg 500 mg 100 mL/hr   12/01/15 1149 Given   metroNIDAZOLE (FLAGYL) IVPB 500 mg 500 mg 100 mL/hr   12/01/15 1314 Given   ceFEPIme (MAXIPIME) 2 g in dextrose 5 % 50 mL IVPB 2 g 100 mL/hr      Principal Problem:   HCAP (healthcare-associated pneumonia) Active Problems:   Acute respiratory failure with hypoxia (HCC)   Chronic diastolic CHF (congestive heart failure) (HCC)   Hypothyroidism   Protein-calorie malnutrition, severe (HCC)   COPD exacerbation ([place])   COPD (chronic obstructive pulmonary disease) ([place])   Dysphagia   DNR (do not resuscitate)   Pressure ulcer    Time spent: 25 min    [facility] Pager [phone]. If 7PM-7AM, please contact night-coverage at [email or web address], password [facility] 12/01/2015, 1:35 PM  LOS: 4 days

## 2015-12-02 LAB — CULTURE, BLOOD (ROUTINE X 2)
CULTURE: NO GROWTH
CULTURE: NO GROWTH

## 2015-12-02 LAB — BASIC METABOLIC PANEL
Anion gap: 10 (ref 5–15)
BUN: 20 mg/dL (ref 6–20)
CO2: 28 mmol/L (ref 22–32)
CREATININE: 0.83 mg/dL (ref 0.61–1.24)
Calcium: 9.2 mg/dL (ref 8.9–10.3)
Chloride: 106 mmol/L (ref 101–111)
GFR calc non Af Amer: >60 mL/min (ref >60)
Glucose, Bld: 86 mg/dL (ref 65–99)
Potassium: 3.4 mmol/L — ABNORMAL LOW (ref 3.5–5.1)
Sodium: 144 mmol/L (ref 135–145)

## 2015-12-02 LAB — CBC
HCT: 32 % — ABNORMAL LOW (ref 39.0–52.0)
HEMOGLOBIN: 10.6 g/dL — AB (ref 13.0–17.0)
MCH: 29.6 pg (ref 26.0–34.0)
MCHC: 33.1 g/dL (ref 30.0–36.0)
MCV: 89.4 fL (ref 78.0–100.0)
Platelets: 330 10*3/uL (ref 150–400)
RBC: 3.58 MIL/uL — AB (ref 4.22–5.81)
RDW: 15.2 % (ref 11.5–15.5)
WBC: 7.6 10*3/uL (ref 4.0–10.5)

## 2015-12-02 MED ORDER — PREDNISONE 20 MG PO TABS
40.0000 mg | ORAL_TABLET | Freq: Every day | ORAL | Status: DC
  Administered 2015-12-03 – 2015-12-04 (×2): 40 mg via ORAL
  Filled 2015-12-02 (×2): qty 2

## 2015-12-02 NOTE — Progress Notes (Signed)
PROGRESS NOTE  Todd [name] [id number] DOB: 1924-03-16 DOA: 11/27/2015 PCP: Odette Fraction, MD  82 y.o. year-old male with history of chronic diastolic heart failure, chronic atrial fibrillation, COPD, moderate malnutrition, hypothyroidism who was recently admitted with sepsis secondary to suspected aspiration pneumonia from 10/31/2015 to 11/04/2015 who presents with shortness of breath. He was discharged to SNF on Augmentin and had been feeling somewhat better. Yesterday, he had a cough and was diagnosed with pneumonia and was restarted on Augmentin. Today, he was sleep and seemed to have difficulty breathing. He was transferred to the emergency department for further evaluation and treatment.  Palliative was following at SNF.  Family continues to want re-hospitalizations.   Assessment/Plan: Acute hypoxic respiratory failure and sepsis with lactic acidosis due to HCAP versus aspiration pneumonia - Sputum culture if able - F/u blood cultures - Vancomycin and cefepime plus Flagyl- change to augmentin - not able to tolerate Bipap - Flu PCR negative -SLP saw: During prior admit SLP discussed severity of impairment and family decided to allow pt to consume diet with known risk-- will start comfort feeds  COPD with acute exacerbation,  - prednisone - nebs - Antibiotics as above  Chronic diastolic heart failure - patient appears euvolemic, no LEE - Daily weights and strict I/O  Chronic atrial fibrillation - patient's CHA2DS2-VASc Score for Stroke Risk is 3, hold Xarelto and all oral medications  Hypothyroidism - synthroid  Severe protein caloric malnutrition  Iron deficiency anemia  Mild hyperglycemia, likely due to steroids and stress - Consider starting SSI if persistent  Hyperkalemia resolved with IVF  Leukocytosis is only mild and may be related to dehydration or sepsis  Poor overall prognosis but family still wants treatment- resume palliative at  SNF  Code Status:DNR Family Communication: spoke with daughter on phone 1/30 Disposition Plan: SNF 2/1- suspect patient will continue to hospital and continue to aspirate   Consultants:  Palliative care  Procedures:      HPI/Subjective: Still with cough  Objective: Filed Vitals:   12/01/15 2247 12/02/15 0518  BP: 138/61 149/56  Pulse: 47 81  Temp: 98 F (36.7 C) 98.6 F (37 C)  Resp: 18 18    Intake/Output Summary (Last 24 hours) at 12/02/15 1311 Last data filed at 12/02/15 0518  Gross per 24 hour  Intake      0 ml  Output   1050 ml  Net  -1050 ml   Filed Weights   12/01/15 0442 12/01/15 1613 12/02/15 0518  Weight: 65.772 kg (145 lb) 80.287 kg (177 lb) 62.596 kg (138 lb)    Exam:   General:  Will awaken, hard of hearing  Cardiovascular: rrr, no longer tachy  Respiratory: clearer lung sounds today  Abdomen: +Bs, soft  Musculoskeletal: no edema   Data Reviewed: Basic Metabolic Panel:  Recent Labs Lab 11/28/15 0029 11/29/15 0245 11/30/15 0343 12/01/15 0334 12/02/15 0808  NA 143 142 139 144 144  K 4.6 3.8 3.5 3.4* 3.4*  CL 106 108 106 108 106  CO2 25 25 26 28 28   GLUCOSE 127* 144* 145* 125* 86  BUN 18 21* 24* 23* 20  CREATININE 0.92 0.87 0.92 0.91 0.83  CALCIUM 9.4 9.2 9.0 9.0 9.2  MG  --   --   --  1.8  --    Liver Function Tests:  Recent Labs Lab 11/27/15 1212  AST 20  ALT 13*  ALKPHOS 94  BILITOT 0.1*  PROT 7.3  ALBUMIN 3.5   No results for  input(s): LIPASE, AMYLASE in the last 168 hours. No results for input(s): AMMONIA in the last 168 hours. CBC:  Recent Labs Lab 11/27/15 1212  11/28/15 0029 11/29/15 0245 11/30/15 0343 12/01/15 0334 12/02/15 0808  WBC 11.1*  --  9.4 5.9 7.8 7.8 7.6  NEUTROABS 7.4  --   --   --   --   --   --   HGB 12.1*  < > 10.1* 9.8* 9.1* 9.8* 10.6*  HCT 37.6*  < > 30.8* 29.9* 27.8* 29.7* 32.0*  MCV 95.2  --  91.7 90.9 90.6 90.0 89.4  PLT 382  --  308 317 305 322 330  < > = values in this  interval not displayed. Cardiac Enzymes:  Recent Labs Lab 11/27/15 1850 11/28/15 0029 11/28/15 0710  TROPONINI 0.04* 0.09* 0.07*   BNP (last 3 results)  Recent Labs  03/29/15 0422 10/31/15 1350 11/27/15 1212  BNP 327.5* 382.1* 479.5*    ProBNP (last 3 results) No results for input(s): PROBNP in the last 8760 hours.  CBG: No results for input(s): GLUCAP in the last 168 hours.  Recent Results (from the past 240 hour(s))  Blood Culture (routine x 2)     Status: None   Collection Time: 11/27/15 12:12 PM  Result Value Ref Range Status   Specimen Description BLOOD LEFT FOREARM  Final   Special Requests BOTTLES DRAWN AEROBIC AND ANAEROBIC 5CCS  Final   Culture NO GROWTH 5 DAYS  Final   Report Status 12/02/2015 FINAL  Final  Blood Culture (routine x 2)     Status: None   Collection Time: 11/27/15 12:22 PM  Result Value Ref Range Status   Specimen Description BLOOD RIGHT HAND  Final   Special Requests BOTTLES DRAWN AEROBIC ONLY 3MLS  Final   Culture NO GROWTH 5 DAYS  Final   Report Status 12/02/2015 FINAL  Final  MRSA PCR Screening     Status: Abnormal   Collection Time: 11/27/15  5:44 PM  Result Value Ref Range Status   MRSA by PCR POSITIVE (A) NEGATIVE Final    Comment:        The GeneXpert MRSA Assay (FDA approved for NASAL specimens only), is one component of a comprehensive MRSA colonization surveillance program. It is not intended to diagnose MRSA infection nor to guide or monitor treatment for MRSA infections. RESULT CALLED TO, READ BACK BY AND VERIFIED WITH: L.SHORT,RN AT 2055 BY L.PITT 11/27/15   Urine culture     Status: None   Collection Time: 11/28/15 12:37 PM  Result Value Ref Range Status   Specimen Description URINE, CLEAN CATCH  Final   Special Requests NONE  Final   Culture NO GROWTH 1 DAY  Final   Report Status 11/29/2015 FINAL  Final  Culture, expectorated sputum-assessment     Status: None   Collection Time: 11/30/15  1:34 PM  Result Value  Ref Range Status   Specimen Description SPUTUM  Final   Special Requests NONE  Final   Sputum evaluation   Final    THIS SPECIMEN IS ACCEPTABLE. RESPIRATORY CULTURE REPORT TO FOLLOW.   Report Status 11/30/2015 FINAL  Final  Culture, respiratory (NON-Expectorated)     Status: None (Preliminary result)   Collection Time: 11/30/15  1:34 PM  Result Value Ref Range Status   Specimen Description SPUTUM  Final   Special Requests NONE  Final   Gram Stain   Final    ABUNDANT WBC PRESENT,BOTH PMN AND MONONUCLEAR RARE SQUAMOUS EPITHELIAL  CELLS PRESENT NO ORGANISMS SEEN Performed at [facility]    Culture   Final    NORMAL OROPHARYNGEAL FLORA Performed at [facility]    Report Status PENDING  Incomplete     Studies: No results found.  Scheduled Meds: . amoxicillin-clavulanate  1 tablet Oral Q12H  . antiseptic oral rinse  7 mL Mouth Rinse BID  . arformoterol  15 mcg Nebulization BID  . budesonide (PULMICORT) nebulizer solution  0.5 mg Nebulization BID  . enoxaparin (LOVENOX) injection  60 mg Subcutaneous Q12H  . ipratropium-albuterol  3 mL Nebulization TID  . levothyroxine  25 mcg Oral QAC breakfast  . mupirocin ointment  1 application Nasal BID  . [START ON 12/03/2015] predniSONE  40 mg Oral Q breakfast   Continuous Infusions:   Antibiotics Given (last 72 hours)    Date/Time Action Medication Dose Rate   11/29/15 1412 Given   ceFEPIme (MAXIPIME) 2 g in dextrose 5 % 50 mL IVPB 2 g 100 mL/hr   11/29/15 1412 Given   vancomycin (VANCOCIN) 500 mg in sodium chloride 0.9 % 100 mL IVPB 500 mg 100 mL/hr   11/29/15 1709 Given   metroNIDAZOLE (FLAGYL) IVPB 500 mg 500 mg 100 mL/hr   11/30/15 0210 Given   ceFEPIme (MAXIPIME) 2 g in dextrose 5 % 50 mL IVPB 2 g 100 mL/hr   11/30/15 0301 Given   vancomycin (VANCOCIN) 500 mg in sodium chloride 0.9 % 100 mL IVPB 500 mg 100 mL/hr   11/30/15 0301 Given   metroNIDAZOLE (FLAGYL) IVPB 500 mg 500 mg 100 mL/hr   11/30/15 0946 Given    metroNIDAZOLE (FLAGYL) IVPB 500 mg 500 mg 100 mL/hr   11/30/15 1227 Given   ceFEPIme (MAXIPIME) 2 g in dextrose 5 % 50 mL IVPB 2 g 100 mL/hr   11/30/15 1611 Given   vancomycin (VANCOCIN) 500 mg in sodium chloride 0.9 % 100 mL IVPB 500 mg 100 mL/hr   11/30/15 1801 Given   metroNIDAZOLE (FLAGYL) IVPB 500 mg 500 mg 100 mL/hr   12/01/15 0030 Given   ceFEPIme (MAXIPIME) 2 g in dextrose 5 % 50 mL IVPB 2 g 100 mL/hr   12/01/15 0200 Given   vancomycin (VANCOCIN) 500 mg in sodium chloride 0.9 % 100 mL IVPB 500 mg 100 mL/hr   12/01/15 0200 Given   metroNIDAZOLE (FLAGYL) IVPB 500 mg 500 mg 100 mL/hr   12/01/15 1149 Given   metroNIDAZOLE (FLAGYL) IVPB 500 mg 500 mg 100 mL/hr   12/01/15 1314 Given   ceFEPIme (MAXIPIME) 2 g in dextrose 5 % 50 mL IVPB 2 g 100 mL/hr   12/01/15 1443 Given   amoxicillin-clavulanate (AUGMENTIN) 875-125 MG per tablet 1 tablet 1 tablet    12/01/15 2201 Given   amoxicillin-clavulanate (AUGMENTIN) 875-125 MG per tablet 1 tablet 1 tablet    12/02/15 0914 Given   amoxicillin-clavulanate (AUGMENTIN) 875-125 MG per tablet 1 tablet 1 tablet       Principal Problem:   HCAP (healthcare-associated pneumonia) Active Problems:   Acute respiratory failure with hypoxia (HCC)   Chronic diastolic CHF (congestive heart failure) (HCC)   Hypothyroidism   Protein-calorie malnutrition, severe (HCC)   COPD exacerbation ([place])   COPD (chronic obstructive pulmonary disease) ([place])   Dysphagia   DNR (do not resuscitate)   Pressure ulcer    Time spent: 25 min    [place] Pager [phone]. If 7PM-7AM, please contact night-coverage at [email or web address], password [facility] 12/02/2015, 1:11 PM  LOS: 5 days

## 2015-12-02 NOTE — Clinical Social Work Note (Signed)
Clinical Social Work Assessment  Patient Details  Name: Todd [name] MRN: [id number] Date of Birth: 08-13-1924  Date of referral:  12/01/15               Reason for consult:  Facility Placement                Permission sought to share information with:  Other (Patient oriented to person only) Permission granted to share information::  No  Name::     Asencion Partridge  Agency::     Relationship::  Daughter  Contact Information:  [phone]  Housing/Transportation Living arrangements for the past 2 months:  [place] ([place] [facility]) Source of Information:  Adult Children (Ms. Janae Bridgeman) Patient Interpreter Needed:  None Criminal Activity/Legal Involvement Pertinent to Current Situation/Hospitalization:    Significant Relationships:  Adult Children Lives with:  Facility Resident [facility]) Do you feel safe going back to the place where you live?  Yes (Patient came to hospital from [facility]) Need for family participation in patient care:  Yes (Comment)  Care giving concerns:  No concerns expressed by daughter.   Social Worker assessment / plan:  On 12/01/15, CSW talked with daughter by phone regarding discharge plans for patient.  Ms. Janae Bridgeman indicated that patient will return to [facility] when medically stable.   Employment status:  Retired Forensic scientist:  Programmer, applications ([employer]) PT Recommendations:  Not assessed at this time Information / Referral to community resources:  Other (Comment Required) (Patient from a facility. No other information needed or requested at this time)  Patient/Family's Response to care:  No concerns expressed by daughter regarding patient's care during hospitalization.  Patient/Family's Understanding of and Emotional Response to Diagnosis, Current Treatment, and Prognosis:  Not discussed.  Emotional Assessment Appearance:  Other (Comment Required (Did not visit  with patient) Attitude/Demeanor/Rapport:  Unable to Assess Affect (typically observed):  Unable to Assess Orientation:  Oriented to Self Alcohol / Substance use:  Tobacco Use (Patient reported that he quit smoking and does not drink or use illicit drugs) Psych involvement (Current and /or in the community):  No (Comment)  Discharge Needs  Concerns to be addressed:  Discharge Planning Concerns Readmission within the last 30 days:  No Current discharge risk:  None Barriers to Discharge:  No Barriers Identified   Sable Feil, LCSW 12/02/2015, 12:52 PM

## 2015-12-03 DIAGNOSIS — J690 Pneumonitis due to inhalation of food and vomit: Secondary | ICD-10-CM

## 2015-12-03 DIAGNOSIS — A419 Sepsis, unspecified organism: Principal | ICD-10-CM

## 2015-12-03 DIAGNOSIS — E43 Unspecified severe protein-calorie malnutrition: Secondary | ICD-10-CM

## 2015-12-03 DIAGNOSIS — R1310 Dysphagia, unspecified: Secondary | ICD-10-CM

## 2015-12-03 DIAGNOSIS — J449 Chronic obstructive pulmonary disease, unspecified: Secondary | ICD-10-CM

## 2015-12-03 DIAGNOSIS — J9601 Acute respiratory failure with hypoxia: Secondary | ICD-10-CM

## 2015-12-03 LAB — CULTURE, RESPIRATORY: CULTURE: NORMAL

## 2015-12-03 LAB — BASIC METABOLIC PANEL
ANION GAP: 6 (ref 5–15)
BUN: 21 mg/dL — AB (ref 6–20)
CHLORIDE: 103 mmol/L (ref 101–111)
CO2: 32 mmol/L (ref 22–32)
Calcium: 9.1 mg/dL (ref 8.9–10.3)
Creatinine, Ser: 0.82 mg/dL (ref 0.61–1.24)
GFR calc Af Amer: >60 mL/min (ref >60)
GLUCOSE: 95 mg/dL (ref 65–99)
POTASSIUM: 3.2 mmol/L — AB (ref 3.5–5.1)
Sodium: 141 mmol/L (ref 135–145)

## 2015-12-03 LAB — CULTURE, RESPIRATORY W GRAM STAIN

## 2015-12-03 LAB — CBC
HEMATOCRIT: 32.7 % — AB (ref 39.0–52.0)
HEMOGLOBIN: 11.1 g/dL — AB (ref 13.0–17.0)
MCH: 30.6 pg (ref 26.0–34.0)
MCHC: 33.9 g/dL (ref 30.0–36.0)
MCV: 90.1 fL (ref 78.0–100.0)
Platelets: 307 10*3/uL (ref 150–400)
RBC: 3.63 MIL/uL — ABNORMAL LOW (ref 4.22–5.81)
RDW: 15.3 % (ref 11.5–15.5)
WBC: 8.6 10*3/uL (ref 4.0–10.5)

## 2015-12-03 MED ORDER — POTASSIUM CHLORIDE 20 MEQ/15ML (10%) PO SOLN
20.0000 meq | Freq: Two times a day (BID) | ORAL | Status: DC
  Administered 2015-12-03 – 2015-12-04 (×3): 20 meq via ORAL
  Filled 2015-12-03 (×3): qty 15

## 2015-12-03 MED ORDER — VENLAFAXINE HCL ER 75 MG PO CP24
75.0000 mg | ORAL_CAPSULE | Freq: Every day | ORAL | Status: DC
  Administered 2015-12-04: 75 mg via ORAL
  Filled 2015-12-03: qty 1

## 2015-12-03 MED ORDER — RIVAROXABAN 20 MG PO TABS
20.0000 mg | ORAL_TABLET | Freq: Every day | ORAL | Status: DC
  Administered 2015-12-03: 20 mg via ORAL
  Filled 2015-12-03: qty 1

## 2015-12-03 MED ORDER — LEVOTHYROXINE SODIUM 25 MCG PO TABS
25.0000 ug | ORAL_TABLET | Freq: Every day | ORAL | Status: DC
  Administered 2015-12-04: 25 ug via ORAL
  Filled 2015-12-03: qty 1

## 2015-12-03 MED ORDER — STARCH (THICKENING) PO POWD
ORAL | Status: DC | PRN
  Filled 2015-12-03: qty 227

## 2015-12-03 MED ORDER — PANTOPRAZOLE SODIUM 40 MG PO TBEC
40.0000 mg | DELAYED_RELEASE_TABLET | Freq: Every day | ORAL | Status: DC
  Administered 2015-12-03 – 2015-12-04 (×2): 40 mg via ORAL
  Filled 2015-12-03 (×2): qty 1

## 2015-12-03 MED ORDER — POTASSIUM CHLORIDE CRYS ER 20 MEQ PO TBCR: 40.0000 meq | EXTENDED_RELEASE_TABLET | Freq: Every day | ORAL | Status: DC

## 2015-12-03 MED ORDER — MOMETASONE FURO-FORMOTEROL FUM 200-5 MCG/ACT IN AERO
2.0000 | INHALATION_SPRAY | Freq: Two times a day (BID) | RESPIRATORY_TRACT | Status: DC
  Administered 2015-12-03 – 2015-12-04 (×2): 2 via RESPIRATORY_TRACT
  Filled 2015-12-03: qty 8.8

## 2015-12-03 NOTE — NC FL2 (Signed)
[place] LEVEL OF CARE SCREENING TOOL     IDENTIFICATION  Patient Name: Todd [name] Birthdate: 01-May-1924 Sex: male Admission Date (Current Location): 11/27/2015  [facility] and [place] Number:  Herbalist and Address:  [facility], [place] [address] [place], [place] [address]      Provider Number: [id number]  Attending Physician Name and Address:  Charlynne Cousins, MD  Relative Name and Phone Number:       Current Level of Care: Hospital Recommended Level of Care: [place] Prior Approval Number:    Date Approved/Denied:   PASRR Number:    Discharge Plan: SNF    Current Diagnoses: Patient Active Problem List   Diagnosis Date Noted  . Pressure ulcer 11/28/2015  . Malnutrition of moderate degree 11/02/2015  . Febrile illness, acute 10/31/2015  . Inflammatory arthritis ([place]) 06/14/2015  . Blistering rash 05/11/2015  . Conjunctivitis 05/11/2015  . Compression fracture of T12 vertebra ([place]) 04/07/2015  . Palliative care encounter 04/02/2015  . Dyspnea 04/02/2015  . Dysphagia 04/02/2015  . DNR (do not resuscitate) 04/02/2015  . COPD (chronic obstructive pulmonary disease) ([place]) 03/29/2015  . NSVT (nonsustained ventricular tachycardia) ([place]) 03/29/2015  . COPD exacerbation ([place]) 03/13/2015  . Anemia, iron deficiency 03/13/2015  . HCAP (healthcare-associated pneumonia) 03/08/2015  . Acute encephalopathy 03/08/2015  . Protein-calorie malnutrition, severe ([place]) 02/06/2015  . Weakness 02/05/2015  . CAP (community acquired pneumonia) 02/05/2015  . Failure to thrive in adult 02/05/2015  . Fall 02/05/2015  . Pancreatitis, gallstone   . Cholecystitis, acute   . Elevated LFTs   . Preoperative cardiovascular examination   . Acute pancreatitis   . Pancreatitis 09/05/2014  . Hypothyroidism 09/05/2014  . Gallstone pancreatitis 11/23/2013  . Chronic diastolic CHF (congestive heart failure) ([place]) 10/28/2011  .  Pneumonia 09/06/2011  . Sepsis ([place]) 09/06/2011  . Chest pain 09/06/2011  . Chronic atrial fibrillation ([place]) 09/06/2011  . Acute respiratory failure with hypoxia ([place]) 09/06/2011  . PNEUMONIA, ORGANISM UNSPECIFIED 03/16/2010  . EDEMA 06/17/2008  . HLD (hyperlipidemia) 01/24/2008  . ALLERGIC RHINITIS 01/24/2008  . Asthma 01/24/2008  . BRONCHIECTASIS 01/24/2008  . Nonspecific (abnormal) findings on radiological and other examination of body structure 01/24/2008  . COLON CANCER 01/24/2008  . CHEST XRAY, ABNORMAL 01/24/2008    Orientation RESPIRATION BLADDER Height & Weight     Self  Normal Incontinent, External catheter (condom catheter) Weight: 137 lb 5.6 oz (62.3 kg) Height:  5\' 6"  (167.6 cm)  BEHAVIORAL SYMPTOMS/MOOD NEUROLOGICAL BOWEL NUTRITION STATUS   (N/A)   Incontinent  (Please see DC summary.)  AMBULATORY STATUS COMMUNICATION OF NEEDS Skin   Extensive Assist Verbally PU Stage and Appropriate Care (Stage 1 on sacrum) PU Stage 1 Dressing: Daily                     Personal Care Assistance Level of Assistance  Bathing, Dressing Bathing Assistance: Maximum assistance   Dressing Assistance: Maximum assistance     Functional Limitations Info  Hearing   Hearing Info: Impaired      SPECIAL CARE FACTORS FREQUENCY                       Contractures Contractures Info: Not present    Additional Factors Info  Code Status, Allergies Code Status Info: DNR Allergies Info: NKA           Current Medications (12/03/2015):  This is the current hospital active  medication list Current Facility-Administered Medications  Medication Dose Route Frequency Provider Last Rate Last Dose  . amoxicillin-clavulanate (AUGMENTIN) 875-125 MG per tablet 1 tablet  1 tablet Oral Q12H Geradine Girt, DO   1 tablet at 12/02/15 2122  . antiseptic oral rinse (CPC / CETYLPYRIDINIUM CHLORIDE 0.05%) solution 7 mL  7 mL Mouth Rinse BID Geradine Girt, DO   7 mL at 12/02/15 2128  .  arformoterol (BROVANA) nebulizer solution 15 mcg  15 mcg Nebulization BID Janece Canterbury, MD   15 mcg at 12/03/15 [phone]  . budesonide (PULMICORT) nebulizer solution 0.5 mg  0.5 mg Nebulization BID Janece Canterbury, MD   0.5 mg at 12/03/15 0839  . enoxaparin (LOVENOX) injection 60 mg  60 mg Subcutaneous Q12H Otilio Miu, RPH   60 mg at 12/03/15 [id number]  . hydrALAZINE (APRESOLINE) injection 10 mg  10 mg Intravenous Q6H PRN Geradine Girt, DO      . ipratropium-albuterol (DUONEB) 0.5-2.5 (3) MG/3ML nebulizer solution 3 mL  3 mL Nebulization Q4H PRN Janece Canterbury, MD      . levothyroxine (SYNTHROID, LEVOTHROID) tablet 25 mcg  25 mcg Oral QAC breakfast Geradine Girt, DO   25 mcg at 12/03/15 0757  . mupirocin ointment (BACTROBAN) 2 % 1 application  1 application Nasal BID Geradine Girt, DO   1 application at [id number]  . predniSONE (DELTASONE) tablet 40 mg  40 mg Oral Q breakfast Geradine Girt, DO   40 mg at 12/03/15 0757  . [place]   Oral PRN Geradine Girt, DO         Discharge Medications: Please see discharge summary for a list of discharge medications.  Relevant Imaging Results:  Relevant Lab Results:   Additional Information SSN: [id number]  Benard Halsted, LCSWA

## 2015-12-03 NOTE — Progress Notes (Signed)
[facility] PROGRESS NOTE    Progress Note   Todd [name] [id number] DOB: 1924-09-22 DOA: 11/27/2015 PCP: Odette Fraction, MD   Brief Narrative:   Todd [name] is an 82 y.o. male past medical history of chronic diastolic heart failure, chronic A. fib recently admitted for sepsis due to aspiration pneumonia on December 2016 present again with aspiration pneumonia.  Assessment/Plan:   Acute respiratory failure with hypoxia due to Aspiration pneumonia (HCC)/sepsis: Sepsis resolved with fluid resuscitation. Lactic acid has cleared Blood cultures remain negative till date. She was transitioned to oral Augmentin has remained with no leukocytosis Influenza PCR was negative. Swallowing evaluation performed in severity of impairment was discussed with the family, family decided to move towards comfort feeds. This seems to be recurrent matter as when he is discharged to the facility the family wants to proceed with aggressive care. Resume all home medications and monitor for 24 hours for signs of aspiration.  Chronic diastolic CHF (congestive heart failure) ([place]): Appears to be euvolemic no JVD no lower extremity edema. Continue current home regimen.  COPD (chronic obstructive pulmonary disease) (HCC) Continue prednisone and inhalers.  Chronic atrial fibrillation - patient's CHA2DS2-VASc Score for Stroke Risk is 3, can resume Xarelto  Hypokaleima: Replete orally resume potassium daily.  Hypothyroidism  Protein-calorie malnutrition, severe (HCC)    Pressure ulcer    DVT Prophylaxis - Lovenox ordered.  Family Communication: Daughter Disposition Plan: Home in am Code Status:     Code Status Orders        Start     Ordered   11/27/15 1740  Do not attempt resuscitation (DNR)   Continuous    Question Answer Comment  In the event of cardiac or respiratory ARREST Do not call a "code blue"   In the event of cardiac or respiratory ARREST Do not perform  Intubation, CPR, defibrillation or ACLS   In the event of cardiac or respiratory ARREST Use medication by any route, position, wound care, and other measures to relive pain and suffering. May use oxygen, suction and manual treatment of airway obstruction as needed for comfort.      11/27/15 1739    Code Status History    Date Active Date Inactive Code Status Order ID Comments User Context   11/27/2015 12:38 PM 11/27/2015  5:39 PM DNR [id number]  Sherwood Gambler, MD ED   10/31/2015  3:45 PM 11/04/2015  4:24 PM DNR [id number]  Caren Griffins, MD ED   03/29/2015 11:22 AM 04/05/2015  9:01 PM DNR [id number]  Samella Parr, NP Inpatient   03/08/2015  3:28 PM 03/11/2015  7:25 PM DNR [id number]  Bonnielee Haff, MD Inpatient   02/16/2015  8:28 PM 03/08/2015  3:28 PM DNR [id number]  Hennie Duos, MD Outpatient   02/05/2015  2:07 AM 02/08/2015 11:27 PM DNR [id number]  Deneise Lever, MD ED   02/05/2015  1:43 AM 02/05/2015  2:07 AM Full Code [id number]  Deneise Lever, MD ED   09/06/2014 12:21 AM 09/10/2014  9:52 PM DNR [id number]  Ivor Costa, MD Inpatient   11/23/2013 10:09 PM 11/28/2013  3:37 PM DNR [id number]  Etta Quill, DO ED   09/08/2011  3:32 PM 09/20/2011  7:31 PM DNR [id number]  Raylene Miyamoto, MD Inpatient        IV Access:    Peripheral IV   Procedures and diagnostic studies:   No results found.   Medical Consultants:    None.  Anti-Infectives:   Anti-infectives  Start     Dose/Rate Route Frequency Ordered Stop   12/01/15 1400  amoxicillin-clavulanate (AUGMENTIN) 875-125 MG per tablet 1 tablet     1 tablet Oral Every 12 hours 12/01/15 1333     11/28/15 0200  vancomycin (VANCOCIN) 500 mg in sodium chloride 0.9 % 100 mL IVPB  Status:  Discontinued     500 mg 100 mL/hr over 60 Minutes Intravenous Every 12 hours 11/27/15 1305 12/01/15 1333   11/28/15 0030  ceFEPIme (MAXIPIME) 2 g in dextrose 5 % 50 mL IVPB  Status:  Discontinued     2 g 100 mL/hr over 30 Minutes Intravenous Every 12 hours  11/27/15 1305 12/01/15 1333   11/27/15 1645  metroNIDAZOLE (FLAGYL) IVPB 500 mg  Status:  Discontinued     500 mg 100 mL/hr over 60 Minutes Intravenous Every 8 hours 11/27/15 1642 12/01/15 1333   11/27/15 1245  vancomycin (VANCOCIN) 1,250 mg in sodium chloride 0.9 % 250 mL IVPB     1,250 mg 166.7 mL/hr over 90 Minutes Intravenous  Once 11/27/15 1229 11/27/15 1456   11/27/15 1215  ceFEPIme (MAXIPIME) 2 g in dextrose 5 % 50 mL IVPB     2 g 100 mL/hr over 30 Minutes Intravenous  Once 11/27/15 1212 11/27/15 1438   11/27/15 1215  vancomycin (VANCOCIN) IVPB 1000 mg/200 mL premix  Status:  Discontinued     1,000 mg 200 mL/hr over 60 Minutes Intravenous  Once 11/27/15 1212 11/27/15 1229      Subjective:    Todd [name] feels better still coughing.  Objective:    Filed Vitals:   12/02/15 2101 12/02/15 2235 12/03/15 0538 12/03/15 0839  BP:  169/81 151/59   Pulse:  48 46   Temp:  98.6 F (37 C) 97.7 F (36.5 C)   TempSrc:  Oral Oral   Resp:  18 18   Height:      Weight:   62.3 kg (137 lb 5.6 oz)   SpO2: 96% 97% 94% 98%    Intake/Output Summary (Last 24 hours) at 12/03/15 0952 Last data filed at 12/03/15 0936  Gross per 24 hour  Intake    240 ml  Output    951 ml  Net   -711 ml   Filed Weights   12/01/15 1613 12/02/15 0518 12/03/15 0538  Weight: 80.287 kg (177 lb) 62.596 kg (138 lb) 62.3 kg (137 lb 5.6 oz)    Exam: Gen:  NAD Cardiovascular:  RRR. Chest and lungs:   Good air movement and clear to auscultation. Abdomen:  Abdomen soft, NT/ND, + BS Extremities:  No edema   Data Reviewed:    Labs: Basic Metabolic Panel:  Recent Labs Lab 11/29/15 0245 11/30/15 0343 12/01/15 0334 12/02/15 0808 12/03/15 0707  NA 142 139 144 144 141  K 3.8 3.5 3.4* 3.4* 3.2*  CL 108 106 108 106 103  CO2 25 26 28 28  32  GLUCOSE 144* 145* 125* 86 95  BUN 21* 24* 23* 20 21*  CREATININE 0.87 0.92 0.91 0.83 0.82  CALCIUM 9.2 9.0 9.0 9.2 9.1  MG  --   --  1.8  --   --     GFR Estimated Creatinine Clearance: 53.8 mL/min (by C-G formula based on Cr of 0.82). Liver Function Tests:  Recent Labs Lab 11/27/15 1212  AST 20  ALT 13*  ALKPHOS 94  BILITOT 0.1*  PROT 7.3  ALBUMIN 3.5   No results for input(s): LIPASE, AMYLASE in the last 168  hours. No results for input(s): AMMONIA in the last 168 hours. Coagulation profile No results for input(s): INR, PROTIME in the last 168 hours.  CBC:  Recent Labs Lab 11/27/15 1212  11/29/15 0245 11/30/15 0343 12/01/15 0334 12/02/15 0808 12/03/15 0707  WBC 11.1*  < > 5.9 7.8 7.8 7.6 8.6  NEUTROABS 7.4  --   --   --   --   --   --   HGB 12.1*  < > 9.8* 9.1* 9.8* 10.6* 11.1*  HCT 37.6*  < > 29.9* 27.8* 29.7* 32.0* 32.7*  MCV 95.2  < > 90.9 90.6 90.0 89.4 90.1  PLT 382  < > 317 305 322 330 307  < > = values in this interval not displayed. Cardiac Enzymes:  Recent Labs Lab 11/27/15 1850 11/28/15 0029 11/28/15 0710  TROPONINI 0.04* 0.09* 0.07*   BNP (last 3 results) No results for input(s): PROBNP in the last 8760 hours. CBG: No results for input(s): GLUCAP in the last 168 hours. D-Dimer: No results for input(s): DDIMER in the last 72 hours. Hgb A1c: No results for input(s): HGBA1C in the last 72 hours. Lipid Profile: No results for input(s): CHOL, HDL, LDLCALC, TRIG, CHOLHDL, LDLDIRECT in the last 72 hours. Thyroid function studies: No results for input(s): TSH, T4TOTAL, T3FREE, THYROIDAB in the last 72 hours.  Invalid input(s): FREET3 Anemia work up: No results for input(s): VITAMINB12, FOLATE, FERRITIN, TIBC, IRON, RETICCTPCT in the last 72 hours. Sepsis Labs:  Recent Labs Lab 11/27/15 1227 11/27/15 1850  11/29/15 0245 11/30/15 0343 12/01/15 0334 12/02/15 0808 12/03/15 0707  WBC  --   --   < > 5.9 7.8 7.8 7.6 8.6  LATICACIDVEN 5.37* 4.8*  --  1.2  --   --   --   --   < > = values in this interval not displayed. Microbiology Recent Results (from the past 240 hour(s))  Blood  Culture (routine x 2)     Status: None   Collection Time: 11/27/15 12:12 PM  Result Value Ref Range Status   Specimen Description BLOOD LEFT FOREARM  Final   Special Requests BOTTLES DRAWN AEROBIC AND ANAEROBIC 5CCS  Final   Culture NO GROWTH 5 DAYS  Final   Report Status 12/02/2015 FINAL  Final  Blood Culture (routine x 2)     Status: None   Collection Time: 11/27/15 12:22 PM  Result Value Ref Range Status   Specimen Description BLOOD RIGHT HAND  Final   Special Requests BOTTLES DRAWN AEROBIC ONLY 3MLS  Final   Culture NO GROWTH 5 DAYS  Final   Report Status 12/02/2015 FINAL  Final  MRSA PCR Screening     Status: Abnormal   Collection Time: 11/27/15  5:44 PM  Result Value Ref Range Status   MRSA by PCR POSITIVE (A) NEGATIVE Final    Comment:        The GeneXpert MRSA Assay (FDA approved for NASAL specimens only), is one component of a comprehensive MRSA colonization surveillance program. It is not intended to diagnose MRSA infection nor to guide or monitor treatment for MRSA infections. RESULT CALLED TO, READ BACK BY AND VERIFIED WITH: L.SHORT,RN AT 2055 BY L.PITT 11/27/15   Urine culture     Status: None   Collection Time: 11/28/15 12:37 PM  Result Value Ref Range Status   Specimen Description URINE, CLEAN CATCH  Final   Special Requests NONE  Final   Culture NO GROWTH 1 DAY  Final   Report Status  11/29/2015 FINAL  Final  Culture, expectorated sputum-assessment     Status: None   Collection Time: 11/30/15  1:34 PM  Result Value Ref Range Status   Specimen Description SPUTUM  Final   Special Requests NONE  Final   Sputum evaluation   Final    THIS SPECIMEN IS ACCEPTABLE. RESPIRATORY CULTURE REPORT TO FOLLOW.   Report Status 11/30/2015 FINAL  Final  Culture, respiratory (NON-Expectorated)     Status: None   Collection Time: 11/30/15  1:34 PM  Result Value Ref Range Status   Specimen Description SPUTUM  Final   Special Requests NONE  Final   Gram Stain   Final     ABUNDANT WBC PRESENT,BOTH PMN AND MONONUCLEAR RARE SQUAMOUS EPITHELIAL CELLS PRESENT NO ORGANISMS SEEN Performed at [facility]    Culture   Final    NORMAL OROPHARYNGEAL FLORA Performed at [facility]    Report Status 12/03/2015 FINAL  Final     Medications:   . amoxicillin-clavulanate  1 tablet Oral Q12H  . antiseptic oral rinse  7 mL Mouth Rinse BID  . arformoterol  15 mcg Nebulization BID  . budesonide (PULMICORT) nebulizer solution  0.5 mg Nebulization BID  . enoxaparin (LOVENOX) injection  60 mg Subcutaneous Q12H  . levothyroxine  25 mcg Oral QAC breakfast  . mupirocin ointment  1 application Nasal BID  . potassium chloride  20 mEq Oral BID  . predniSONE  40 mg Oral Q breakfast   Continuous Infusions:   Time spent: 15 min   LOS: 6 days   Charlynne Cousins  [facility] Pager [phone]  *Please refer to [email or web address], password [email or web address] to get updated schedule on who will round on this patient, as hospitalists switch teams weekly. If 7PM-7AM, please contact night-coverage at [email or web address], password [email or web address] for any overnight needs.  12/03/2015, 9:52 AM

## 2015-12-04 DIAGNOSIS — I5032 Chronic diastolic (congestive) heart failure: Secondary | ICD-10-CM

## 2015-12-04 LAB — CBC
HEMATOCRIT: 33.3 % — AB (ref 39.0–52.0)
HEMOGLOBIN: 11 g/dL — AB (ref 13.0–17.0)
MCH: 30 pg (ref 26.0–34.0)
MCHC: 33 g/dL (ref 30.0–36.0)
MCV: 90.7 fL (ref 78.0–100.0)
Platelets: 302 10*3/uL (ref 150–400)
RBC: 3.67 MIL/uL — AB (ref 4.22–5.81)
RDW: 15.7 % — ABNORMAL HIGH (ref 11.5–15.5)
WBC: 9.8 10*3/uL (ref 4.0–10.5)

## 2015-12-04 LAB — BASIC METABOLIC PANEL
ANION GAP: 8 (ref 5–15)
BUN: 25 mg/dL — ABNORMAL HIGH (ref 6–20)
CHLORIDE: 104 mmol/L (ref 101–111)
CO2: 30 mmol/L (ref 22–32)
Calcium: 8.9 mg/dL (ref 8.9–10.3)
Creatinine, Ser: 0.88 mg/dL (ref 0.61–1.24)
GFR calc non Af Amer: >60 mL/min (ref >60)
Glucose, Bld: 96 mg/dL (ref 65–99)
POTASSIUM: 3.5 mmol/L (ref 3.5–5.1)
Sodium: 142 mmol/L (ref 135–145)

## 2015-12-04 MED ORDER — PREDNISONE 10 MG PO TABS: ORAL_TABLET | ORAL | Status: DC

## 2015-12-04 MED ORDER — AMOXICILLIN-POT CLAVULANATE 875-125 MG PO TABS: 1.0000 | ORAL_TABLET | Freq: Two times a day (BID) | ORAL | Status: AC

## 2015-12-04 NOTE — Care Management Important Message (Signed)
Important Message  Patient Details  Name: Todd [name] MRN: [id number] Date of Birth: [birth year]   Medicare Important Message Given:  Yes    Carles Collet, RN 12/04/2015, 8:59 AMImportant Message  Patient Details  Name: Todd [name] MRN: [id number] Date of Birth: [birth year]   Medicare Important Message Given:  Yes    Carles Collet, RN 12/04/2015, 8:59 AM

## 2015-12-04 NOTE — Progress Notes (Signed)
Patient will DC to: [facility] Anticipated DC date: 12/04/15 Family notified: Daughter Transport by: [facility]  CSW signing off.  Cedric Fishman, [place] Social Worker [phone]

## 2015-12-04 NOTE — Discharge Summary (Addendum)
Physician Discharge Summary  Todd [name] JSE:[id number] DOB: 08-Aug-1924 DOA: 11/27/2015  PCP: Odette Fraction, MD  Admit date: 11/27/2015 Discharge date: 12/05/2015  Time spent: 35 minutes  Recommendations for Outpatient Follow-up:  1. Skilled nursing facility with palliative care follow-up. 2. Continue Augmentin for 4 more days.   Discharge Diagnoses:  Principal Problem:   Acute respiratory failure with hypoxia (HCC) Active Problems:   Chronic diastolic CHF (congestive heart failure) (HCC)   COPD (chronic obstructive pulmonary disease) (HCC)   Sepsis (HCC)   Hypothyroidism   Protein-calorie malnutrition, severe ([place])   Aspiration pneumonia ([place])   Dysphagia   DNR (do not resuscitate)   Discharge Condition: guarded  Diet recommendation: dys 3 diet  Filed Weights   12/02/15 0518 12/03/15 0538 12/04/15 0500  Weight: 62.596 kg (138 lb) 62.3 kg (137 lb 5.6 oz) 62.596 kg (138 lb)    History of present illness:  History 82-year-old with past medical history of chronic diastolic heart failure, chronic atrial fibrillation on scrotal and multiple other comorbidities recently discharged on the hospital on 11/04/2015 comes in with cough and shortness of breath.  Hospital Course:  Acute respiratory failure with hypoxia due to aspiration pneumonia/sepsis: He was started on aggressive IV fluid and empiric antibiotic coverage is lactic acidosis and blood pressure improved. Blood cultures remain negative. A swallowing evaluation was done that showed high risk for aspiration. His antibiotics were transitioned to Augmentin which she will continue for a total of 10 days. Influenza PCR was negative. Palliative  Care met with the family and they would like not to proceed to comfort care.   will go to skilled nursing facility with palliative care follow-up.  Chronic diastolic heart failure: Seems to be euvolemic.  COPD exacerbation: Probably contributing to ask his acute respiratory  failure with hypoxia he was started on steroids we she will continue tapered at the facility.  Chronic atrial fibrillation: CHA2DS2-VASc Score for Stroke Risk is 3, cont.  Xarelto.  Severe protein current malnutrition   Consultations:  none  Discharge Exam: Filed Vitals:   12/03/15 2122 12/04/15 0557  BP: 147/81 112/57  Pulse: 89 53  Temp: 98.3 F (36.8 C) 98.4 F (36.9 C)  Resp: 16 18    General: A&O x3 Cardiovascular: RRR Respiratory: good air movement CTA B/L  Discharge Instructions   Discharge Instructions    Diet - low sodium heart healthy    Complete by:  As directed      Increase activity slowly    Complete by:  As directed           Discharge Medication List as of 12/04/2015 12:11 PM    START taking these medications   Details  predniSONE (DELTASONE) 10 MG tablet Takes 6 tablets for 1 days, then 5 tablets for 1 days, then 4 tablets for 1 days, then 3 tablets for 1 days, then 2 tabs for 1 days, then 1 tab for 1 days, and then stop., Print      CONTINUE these medications which have CHANGED   Details  amoxicillin-clavulanate (AUGMENTIN) 875-125 MG tablet Take 1 tablet by mouth 2 (two) times daily., Starting 12/04/2015, Until Mon 12/08/15, Print      CONTINUE these medications which have NOT CHANGED   Details  acetaminophen (TYLENOL) 325 MG tablet Take 650 mg by mouth every 6 (six) hours as needed for mild pain., Until Discontinued, Historical Med    ADVAIR DISKUS 500-50 MCG/DOSE AEPB INHALE 1 PUFF TWICE A DAY, Normal  ARTIFICIAL TEAR SOLUTION OP Apply to eye. Instill 2 drops into both eyes four times daily as needed, Until Discontinued, Historical Med    dextromethorphan-guaiFENesin (MUCINEX DM) 30-600 MG 12hr tablet Take 1 tablet by mouth 2 (two) times daily., Until Discontinued, Historical Med    feeding supplement, ENSURE ENLIVE, (ENSURE ENLIVE) LIQD Take 237 mLs by mouth 2 (two) times daily between meals., Starting 03/11/2015, Until Discontinued, No  Print    ferrous sulfate 325 (65 FE) MG tablet TAKE 1 TABLET BY MOUTH EVERY DAY, Normal    fluticasone (FLONASE) 50 MCG/ACT nasal spray Place 2 sprays into both nostrils daily., Until Discontinued, Historical Med    guaiFENesin 200 MG tablet Take 200 mg by mouth every 4 (four) hours as needed for cough or to loosen phlegm., Until Discontinued, Historical Med    ipratropium-albuterol (DUONEB) 0.5-2.5 (3) MG/3ML SOLN Take 3 mLs by nebulization. Every 4-6 hours as needed for SOB/wheezing, Until Discontinued, Historical Med    levalbuterol (XOPENEX) 0.63 MG/3ML nebulizer solution Take 3 mLs (0.63 mg total) by nebulization every 4 (four) hours as needed for wheezing or shortness of breath., Starting 03/11/2015, Until Discontinued, No Print    levothyroxine (SYNTHROID, LEVOTHROID) 25 MCG tablet TAKE 1 TABLET BY MOUTH EVERY DAY, Normal    losartan (COZAAR) 50 MG tablet Take 50 mg by mouth daily., Starting 01/23/2015, Until Discontinued, Historical Med    omeprazole (PRILOSEC) 40 MG capsule Take 40 mg by mouth daily., Until Discontinued, Historical Med    potassium chloride SA (K-DUR,KLOR-CON) 20 MEQ tablet Take 40 mEq by mouth daily., Starting 10/30/2015, Until Discontinued, Historical Med    pravastatin (PRAVACHOL) 20 MG tablet Take 20 mg by mouth daily., Until Discontinued, Historical Med    rivaroxaban (XARELTO) 20 MG TABS tablet Take 1 tablet (20 mg total) by mouth daily with supper., Starting 03/11/2015, Until Discontinued, No Print    tamsulosin (FLOMAX) 0.4 MG CAPS capsule Take 1 capsule (0.4 mg total) by mouth daily., Starting 03/11/2015, Until Discontinued, No Print    venlafaxine XR (EFFEXOR XR) 37.5 MG 24 hr capsule Take 2 capsules (75 mg total) by mouth daily with breakfast. 2 pills in AM, Starting 03/11/2015, Until Discontinued, No Print       No Known Allergies Follow-up Information    Follow up with [facility] TOM, MD In 2 weeks.   Specialty:  Family Medicine   Why:  hospital  follow up   Contact information:   [address] [place] [address] [phone]        The results of significant diagnostics from this hospitalization (including imaging, microbiology, ancillary and laboratory) are listed below for reference.    Significant Diagnostic Studies: Dg Chest Port 1 View  11/27/2015  CLINICAL DATA:  Hypoxia.  Fever. EXAM: PORTABLE CHEST 1 VIEW COMPARISON:  10/31/2015. FINDINGS: Stable enlarged cardiac silhouette and tortuous, partially calcified thoracic aorta. Interval patchy opacity in the right upper lobe. Unremarkable bones. IMPRESSION: Right upper lobe pneumonia. Electronically Signed   By: Claudie Revering M.D.   On: 11/27/2015 12:38    Microbiology: Recent Results (from the past 240 hour(s))  Blood Culture (routine x 2)     Status: None   Collection Time: 11/27/15 12:12 PM  Result Value Ref Range Status   Specimen Description BLOOD LEFT FOREARM  Final   Special Requests BOTTLES DRAWN AEROBIC AND ANAEROBIC 5CCS  Final   Culture NO GROWTH 5 DAYS  Final   Report Status 12/02/2015 FINAL  Final  Blood  Culture (routine x 2)     Status: None   Collection Time: 11/27/15 12:22 PM  Result Value Ref Range Status   Specimen Description BLOOD RIGHT HAND  Final   Special Requests BOTTLES DRAWN AEROBIC ONLY 3MLS  Final   Culture NO GROWTH 5 DAYS  Final   Report Status 12/02/2015 FINAL  Final  MRSA PCR Screening     Status: Abnormal   Collection Time: 11/27/15  5:44 PM  Result Value Ref Range Status   MRSA by PCR POSITIVE (A) NEGATIVE Final    Comment:        The GeneXpert MRSA Assay (FDA approved for NASAL specimens only), is one component of a comprehensive MRSA colonization surveillance program. It is not intended to diagnose MRSA infection nor to guide or monitor treatment for MRSA infections. RESULT CALLED TO, READ BACK BY AND VERIFIED WITH: L.SHORT,RN AT 2055 BY L.PITT 11/27/15   Urine culture     Status: None   Collection Time:  11/28/15 12:37 PM  Result Value Ref Range Status   Specimen Description URINE, CLEAN CATCH  Final   Special Requests NONE  Final   Culture NO GROWTH 1 DAY  Final   Report Status 11/29/2015 FINAL  Final  Culture, expectorated sputum-assessment     Status: None   Collection Time: 11/30/15  1:34 PM  Result Value Ref Range Status   Specimen Description SPUTUM  Final   Special Requests NONE  Final   Sputum evaluation   Final    THIS SPECIMEN IS ACCEPTABLE. RESPIRATORY CULTURE REPORT TO FOLLOW.   Report Status 11/30/2015 FINAL  Final  Culture, respiratory (NON-Expectorated)     Status: None   Collection Time: 11/30/15  1:34 PM  Result Value Ref Range Status   Specimen Description SPUTUM  Final   Special Requests NONE  Final   Gram Stain   Final    ABUNDANT WBC PRESENT,BOTH PMN AND MONONUCLEAR RARE SQUAMOUS EPITHELIAL CELLS PRESENT NO ORGANISMS SEEN Performed at [facility]    Culture   Final    NORMAL OROPHARYNGEAL FLORA Performed at [facility]    Report Status 12/03/2015 FINAL  Final     Labs: Basic Metabolic Panel:  Recent Labs Lab 11/30/15 0343 12/01/15 0334 12/02/15 0808 12/03/15 0707 12/04/15 0537  NA 139 144 144 141 142  K 3.5 3.4* 3.4* 3.2* 3.5  CL 106 108 106 103 104  CO2 '26 28 28 '$ 32 30  GLUCOSE 145* 125* 86 95 96  BUN 24* 23* 20 21* 25*  CREATININE 0.92 0.91 0.83 0.82 0.88  CALCIUM 9.0 9.0 9.2 9.1 8.9  MG  --  1.8  --   --   --    Liver Function Tests: No results for input(s): AST, ALT, ALKPHOS, BILITOT, PROT, ALBUMIN in the last 168 hours. No results for input(s): LIPASE, AMYLASE in the last 168 hours. No results for input(s): AMMONIA in the last 168 hours. CBC:  Recent Labs Lab 11/30/15 0343 12/01/15 0334 12/02/15 0808 12/03/15 0707 12/04/15 0537  WBC 7.8 7.8 7.6 8.6 9.8  HGB 9.1* 9.8* 10.6* 11.1* 11.0*  HCT 27.8* 29.7* 32.0* 32.7* 33.3*  MCV 90.6 90.0 89.4 90.1 90.7  PLT 305 322 330 307 302   Cardiac Enzymes: No results  for input(s): CKTOTAL, CKMB, CKMBINDEX, TROPONINI in the last 168 hours. BNP: BNP (last 3 results)  Recent Labs  03/29/15 0422 10/31/15 1350 11/27/15 1212  BNP 327.5* 382.1* 479.5*    ProBNP (last 3  results) No results for input(s): PROBNP in the last 8760 hours.  CBG: No results for input(s): GLUCAP in the last 168 hours.     Signed:  Charlynne Cousins MD.  [facility] 12/05/2015, 2:28 PM

## 2015-12-18 ENCOUNTER — Encounter: Payer: Self-pay | Admitting: Internal Medicine

## 2015-12-18 ENCOUNTER — Non-Acute Institutional Stay (SKILLED_NURSING_FACILITY): Payer: Medicare Other | Admitting: Internal Medicine

## 2015-12-18 DIAGNOSIS — E038 Other specified hypothyroidism: Secondary | ICD-10-CM

## 2015-12-18 DIAGNOSIS — I5032 Chronic diastolic (congestive) heart failure: Secondary | ICD-10-CM | POA: Diagnosis not present

## 2015-12-18 DIAGNOSIS — E43 Unspecified severe protein-calorie malnutrition: Secondary | ICD-10-CM | POA: Diagnosis not present

## 2015-12-18 DIAGNOSIS — F329 Major depressive disorder, single episode, unspecified: Secondary | ICD-10-CM

## 2015-12-18 DIAGNOSIS — I482 Chronic atrial fibrillation, unspecified: Secondary | ICD-10-CM

## 2015-12-18 DIAGNOSIS — R627 Adult failure to thrive: Secondary | ICD-10-CM

## 2015-12-18 DIAGNOSIS — R1310 Dysphagia, unspecified: Secondary | ICD-10-CM | POA: Diagnosis not present

## 2015-12-18 DIAGNOSIS — R413 Other amnesia: Secondary | ICD-10-CM | POA: Diagnosis not present

## 2015-12-18 DIAGNOSIS — J449 Chronic obstructive pulmonary disease, unspecified: Secondary | ICD-10-CM

## 2015-12-18 DIAGNOSIS — E034 Atrophy of thyroid (acquired): Secondary | ICD-10-CM

## 2015-12-18 NOTE — Progress Notes (Signed)
Patient ID: Todd [name], male   DOB: 04-04-1924, 82 y.o.   MRN: [id number]    DATE:12/18/15  Location:  [facility]    Place of Service: SNF (31)   Extended Emergency Contact Information Primary Emergency Contact: [place] Phone: [phone] Relation: Daughter Secondary Emergency Contact: [place] Mobile Phone: [phone] Relation: Grandson  Advanced Directive information Does patient have an advance directive?: Yes, Type of Advance Directive: Living will, Does patient want to make changes to advanced directive?: No - Patient declined  Chief Complaint  Patient presents with  . Readmit To SNF    HPI:  82 yo male seen today for readmission into SNF following hospital stay for acute respiratory failure with hypoxia, aspiration pneumonia, sepsis, dysphagia, COPD, chronic diastolic HF, severe protein calorie malnutrition, hypothyroidism. He was tx empirically for aspiration pneumonia with AUgmentin. Respiratory failure mx without intubation. Given IVF. Influenza PCR neg. Palliative care consulted and family decided not to proceed with comfort care  Today he as no c/o. He is a poor historian due to hearing loss. Hx obtained from chart. Nursing c/a low grade temp 99.0. BP and HR stable. O2 sats nml. Appetite poor. No falls. Sleeping well. He has completed augmentin tx  CHF/afib - stable. Rate controlled . He takes xeralto. BP stable on losartan  COPD - stable on advair, mucinex dm, nebs, flonase  FTT/severe protein cal malnutrition - takes nutritional supplements per facility protocol. He also takes vitamins and minerals  Mood d/o/dementia - stable on effexor xr  Hyperlipidemia - stable on pravastatin.  Dysphagia/GERD - stable on omeprazole Hypothyroid - stable on levothyroxine  BPH - stable on flomax  Arthritis - pain stable on tylenol prn  Hx colon ca - no known recurrence   Past  Medical History  Diagnosis Date  . Asthma   . Hypertension   . A-fib ([place])   . Hyperlipidemia   . Shortness of breath   . CHF (congestive heart failure) ([place]) 10/28/2011  . Forgetfulness   . PVC's (premature ventricular contractions)   . Mild aortic insufficiency   . Dementia     [name] 02/05/2015  . Failure to thrive in adult     /notes 02/05/2015  . COPD (chronic obstructive pulmonary disease) ([place])   . Hypothyroidism     [name] 02/05/2015  . Fall 02/04/2015    with altered mental status off of his baseline after he suffered a mechanical fall /notes 02/04/2015  . CAP (community acquired pneumonia)     [name] 02/05/2015  . Colon cancer [facility])     Past Surgical History  Procedure Laterality Date  . Colon surgery      Partial hemecolectomy   . Central line insertion  09/08/2011         Patient Care Team: Susy Frizzle, MD as PCP - General (Family Medicine)  Social History   Social History  . Marital Status: Widowed    Spouse Name: N/A  . Number of Children: 2  . Years of Education: 12   Occupational History  . 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 Smokeless tobacco: Not on file     Comment: quit 30+ yrs ago  . Alcohol Use: No  . Drug Use: No  . Sexual Activity: No   Other Topics Concern  . Not on file   Social History  Narrative   [place] man. 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 Widowed 03/15/14. Had a son who died at 56. Has a daughter, 2 grandchildren, 2 great-grandchildren. Reports that he was living with his daughter     reports that he has quit smoking. He does not have any smokeless tobacco history on file. He reports that he does not drink alcohol or use illicit drugs.  Immunization History  Administered Date(s) Administered  . Influenza Whole 07/02/2009, 09/10/2011  . Influenza,inj,Quad PF,36+ Mos 08/28/2013,  09/05/2014  . Influenza-Unspecified 08/12/2015  . Pneumococcal Conjugate-13 09/05/2014  . Pneumococcal Polysaccharide-23 11/01/2006  . Zoster 08/26/2006    No Known Allergies  Medications: Patient's Medications  New Prescriptions   No medications on file  Previous Medications   ACETAMINOPHEN (TYLENOL) 325 MG TABLET    Take 650 mg by mouth every 6 (six) hours as needed for mild pain.   ADVAIR DISKUS 500-50 MCG/DOSE AEPB    INHALE 1 PUFF TWICE A DAY   ARTIFICIAL TEAR SOLUTION OP    Apply to eye. Instill 2 drops into both eyes four times daily as needed   DEXTROMETHORPHAN-GUAIFENESIN (MUCINEX DM) 30-600 MG 12HR TABLET    Take 1 tablet by mouth 2 (two) times daily.   FEEDING SUPPLEMENT, ENSURE ENLIVE, (ENSURE ENLIVE) LIQD    Take 237 mLs by mouth 2 (two) times daily between meals.   FERROUS SULFATE 325 (65 FE) MG TABLET    TAKE 1 TABLET BY MOUTH EVERY DAY   FLUTICASONE (FLONASE) 50 MCG/ACT NASAL SPRAY    Place 2 sprays into both nostrils daily.   GUAIFENESIN 200 MG TABLET    Take 200 mg by mouth every 4 (four) hours as needed for cough or to loosen phlegm.   IPRATROPIUM-ALBUTEROL (DUONEB) 0.5-2.5 (3) MG/3ML SOLN    Take 3 mLs by nebulization. Every 4-6 hours as needed for SOB/wheezing   LEVALBUTEROL (XOPENEX) 0.63 MG/3ML NEBULIZER SOLUTION    Take 3 mLs (0.63 mg total) by nebulization every 4 (four) hours as needed for wheezing or shortness of breath.   LEVOTHYROXINE (SYNTHROID, LEVOTHROID) 25 MCG TABLET    TAKE 1 TABLET BY MOUTH EVERY DAY   LOSARTAN (COZAAR) 50 MG TABLET    Take 50 mg by mouth daily.   OMEPRAZOLE (PRILOSEC) 40 MG CAPSULE    Take 40 mg by mouth daily.   POTASSIUM CHLORIDE SA (K-DUR,KLOR-CON) 20 MEQ TABLET    Take 40 mEq by mouth daily.   PRAVASTATIN (PRAVACHOL) 20 MG TABLET    Take 20 mg by mouth daily.   PREDNISONE (DELTASONE) 10 MG TABLET    Takes 6 tablets for 1 days, then 5 tablets for 1 days, then 4 tablets for 1 days, then 3 tablets for 1 days, then 2 tabs for 1 days,  then 1 tab for 1 days, and then stop.   RIVAROXABAN (XARELTO) 20 MG TABS TABLET    Take 1 tablet (20 mg total) by mouth daily with supper.   TAMSULOSIN (FLOMAX) 0.4 MG CAPS CAPSULE    Take 1 capsule (0.4 mg total) by mouth daily.   VENLAFAXINE XR (EFFEXOR XR) 37.5 MG 24 HR CAPSULE    Take 2 capsules (75 mg total) by mouth daily with breakfast. 2 pills in AM  Modified Medications   No medications on file  Discontinued Medications   No medications on file    Review of Systems  Unable to  perform ROS: Other    Filed Vitals:   12/18/15 1207  BP: 124/60  Pulse: 88  Temp: 96.5 F (35.8 C)  TempSrc: Oral  Resp: 20  Weight: 136 lb (61.689 kg)  SpO2: 96%   Body mass index is 21.96 kg/(m^2).  Physical Exam  Constitutional: He appears well-developed.  Frail appearing in NAD. [place] O2 intact. lying in bed resting but easily awakened  HENT:  Mouth/Throat: Oropharynx is clear and moist.  HOH  Eyes: Pupils are equal, round, and reactive to light. No scleral icterus.  Neck: Neck supple. Carotid bruit is not present. No thyromegaly present.  Cardiovascular: Normal rate and intact distal pulses.  An irregularly irregular rhythm present. Exam reveals no gallop and no friction rub.   Murmur heard.  Systolic murmur is present with a grade of 1/6  +1 pitting LE edema b/l. No calf TTP  Pulmonary/Chest: Effort normal. No accessory muscle usage. No respiratory distress. He has decreased breath sounds (b/l at base). He has no wheezes. He has no rhonchi. He has no rales. He exhibits no tenderness.  Abdominal: Soft. Bowel sounds are normal. He exhibits no distension, no abdominal bruit, no pulsatile midline mass and no mass. There is no tenderness. There is no rebound and no guarding.  Musculoskeletal: He exhibits edema.  Small and large joint deformities  Lymphadenopathy:    He has no cervical adenopathy.  Neurological: He is alert.  Skin: Skin is warm and dry. No rash noted.  Psychiatric: He has a  normal mood and affect. His behavior is normal.     Labs reviewed: Admission on 11/27/2015, Discharged on 12/04/2015  No results displayed because visit has over 200 results.    Admission on 10/31/2015, Discharged on 11/04/2015  Component Date Value Ref Range Status  . Lactic Acid, Venous 10/31/2015 1.38  0.5 - 2.0 mmol/L Final  . Sodium 10/31/2015 141  135 - 145 mmol/L Final  . Potassium 10/31/2015 3.7  3.5 - 5.1 mmol/L Final  . Chloride 10/31/2015 112* 101 - 111 mmol/L Final  . CO2 10/31/2015 21* 22 - 32 mmol/L Final  . Glucose, Bld 10/31/2015 99  65 - 99 mg/dL Final  . BUN 10/31/2015 12  6 - 20 mg/dL Final  . Creatinine, Ser 10/31/2015 0.72  0.61 - 1.24 mg/dL Final  . Calcium 10/31/2015 7.3* 8.9 - 10.3 mg/dL Final  . Total Protein 10/31/2015 5.1* 6.5 - 8.1 g/dL Final  . Albumin 10/31/2015 2.5* 3.5 - 5.0 g/dL Final  . AST 10/31/2015 15  15 - 41 U/L Final  . ALT 10/31/2015 11* 17 - 63 U/L Final  . Alkaline Phosphatase 10/31/2015 72  38 - 126 U/L Final  . Total Bilirubin 10/31/2015 0.5  0.3 - 1.2 mg/dL Final  . GFR calc non Af Amer 10/31/2015 >60  >60 mL/min Final  . GFR calc Af Amer 10/31/2015 >60  >60 mL/min Final   Comment: (NOTE) The eGFR has been calculated using the CKD EPI equation. This calculation has not been validated in all clinical situations. eGFR's persistently <60 mL/min signify possible Chronic Kidney Disease.   . Anion gap 10/31/2015 8  5 - 15 Final  . WBC 10/31/2015 6.4  4.0 - 10.5 K/uL Final  . RBC 10/31/2015 2.87* 4.22 - 5.81 MIL/uL Final  . Hemoglobin 10/31/2015 8.8* 13.0 - 17.0 g/dL Final  . HCT 10/31/2015 26.3* 39.0 - 52.0 % Final  . MCV 10/31/2015 91.6  78.0 - 100.0 fL Final  . MCH 10/31/2015 30.7  26.0 - 34.0 pg Final  . MCHC 10/31/2015 33.5  30.0 - 36.0 g/dL Final  . RDW 10/31/2015 14.9  11.5 - 15.5 % Final  . Platelets 10/31/2015 213  150 - 400 K/uL Final  . Neutrophils Relative % 10/31/2015 60   Final  . Neutro Abs 10/31/2015 3.9  1.7 - 7.7  K/uL Final  . Lymphocytes Relative 10/31/2015 19   Final  . Lymphs Abs 10/31/2015 1.2  0.7 - 4.0 K/uL Final  . Monocytes Relative 10/31/2015 18   Final  . Monocytes Absolute 10/31/2015 1.2* 0.1 - 1.0 K/uL Final  . Eosinophils Relative 10/31/2015 2   Final  . Eosinophils Absolute 10/31/2015 0.1  0.0 - 0.7 K/uL Final  . Basophils Relative 10/31/2015 1   Final  . Basophils Absolute 10/31/2015 0.0  0.0 - 0.1 K/uL Final  . Specimen Description 10/31/2015 BLOOD RIGHT FOREARM   Final  . Special Requests 10/31/2015 BOTTLES DRAWN AEROBIC AND ANAEROBIC 5CC   Final  . Culture 10/31/2015 NO GROWTH 5 DAYS   Final  . Report Status 10/31/2015 11/05/2015 FINAL   Final  . Specimen Description 10/31/2015 BLOOD LEFT FOREARM   Final  . Special Requests 10/31/2015 BOTTLES DRAWN AEROBIC AND ANAEROBIC 5CC   Final  . Culture 10/31/2015 NO GROWTH 5 DAYS   Final  . Report Status 10/31/2015 11/05/2015 FINAL   Final  . Color, Urine 10/31/2015 YELLOW  YELLOW Final  . APPearance 10/31/2015 CLEAR  CLEAR Final  . Specific Gravity, Urine 10/31/2015 1.014  1.005 - 1.030 Final  . pH 10/31/2015 6.5  5.0 - 8.0 Final  . Glucose, UA 10/31/2015 NEGATIVE  NEGATIVE mg/dL Final  . Hgb urine dipstick 10/31/2015 NEGATIVE  NEGATIVE Final  . Bilirubin Urine 10/31/2015 NEGATIVE  NEGATIVE Final  . Ketones, ur 10/31/2015 NEGATIVE  NEGATIVE mg/dL Final  . Protein, ur 10/31/2015 NEGATIVE  NEGATIVE mg/dL Final  . Nitrite 10/31/2015 NEGATIVE  NEGATIVE Final  . Leukocytes, UA 10/31/2015 NEGATIVE  NEGATIVE Final   MICROSCOPIC NOT DONE ON URINES WITH NEGATIVE PROTEIN, BLOOD, LEUKOCYTES, NITRITE, OR GLUCOSE <1000 mg/dL.  [place] Kitchen Specimen Description 10/31/2015 URINE, CATHETERIZED   Final  . Special Requests 10/31/2015 NONE   Final  . Culture 10/31/2015 NO GROWTH 1 DAY   Final  . Report Status 10/31/2015 11/01/2015 FINAL   Final  . B Natriuretic Peptide 10/31/2015 382.1* 0.0 - 100.0 pg/mL Final  . Sodium 11/01/2015 142  135 - 145 mmol/L Final  .  Potassium 11/01/2015 4.4  3.5 - 5.1 mmol/L Final  . Chloride 11/01/2015 109  101 - 111 mmol/L Final  . CO2 11/01/2015 22  22 - 32 mmol/L Final  . Glucose, Bld 11/01/2015 152* 65 - 99 mg/dL Final  . BUN 11/01/2015 18  6 - 20 mg/dL Final  . Creatinine, Ser 11/01/2015 0.91  0.61 - 1.24 mg/dL Final  . Calcium 11/01/2015 8.9  8.9 - 10.3 mg/dL Final  . GFR calc non Af Amer 11/01/2015 >60  >60 mL/min Final  . GFR calc Af Amer 11/01/2015 >60  >60 mL/min Final   Comment: (NOTE) The eGFR has been calculated using the CKD EPI equation. This calculation has not been validated in all clinical situations. eGFR's persistently <60 mL/min signify possible Chronic Kidney Disease.   . Anion gap 11/01/2015 11  5 - 15 Final  . WBC 11/01/2015 4.6  4.0 - 10.5 K/uL Final  . RBC 11/01/2015 3.66* 4.22 - 5.81 MIL/uL Final  . Hemoglobin 11/01/2015 10.7* 13.0 - 17.0 g/dL  Final  . HCT 11/01/2015 33.1* 39.0 - 52.0 % Final  . MCV 11/01/2015 90.4  78.0 - 100.0 fL Final  . MCH 11/01/2015 29.2  26.0 - 34.0 pg Final  . MCHC 11/01/2015 32.3  30.0 - 36.0 g/dL Final  . RDW 11/01/2015 14.8  11.5 - 15.5 % Final  . Platelets 11/01/2015 264  150 - 400 K/uL Final  . MRSA by PCR 10/31/2015 POSITIVE* NEGATIVE Final   Comment:        The GeneXpert MRSA Assay (FDA approved for NASAL specimens only), is one component of a comprehensive MRSA colonization surveillance program. It is not intended to diagnose MRSA infection nor to guide or monitor treatment for MRSA infections. RESULT CALLED TO, READ BACK BY AND VERIFIED WITH: ANTELLONE,J.,RN AT 2007 BY L.PITT 10/31/15   . WBC 11/02/2015 7.8  4.0 - 10.5 K/uL Final  . RBC 11/02/2015 3.34* 4.22 - 5.81 MIL/uL Final  . Hemoglobin 11/02/2015 10.1* 13.0 - 17.0 g/dL Final  . HCT 11/02/2015 30.3* 39.0 - 52.0 % Final  . MCV 11/02/2015 90.7  78.0 - 100.0 fL Final  . MCH 11/02/2015 30.2  26.0 - 34.0 pg Final  . MCHC 11/02/2015 33.3  30.0 - 36.0 g/dL Final  . RDW 11/02/2015 14.8  11.5  - 15.5 % Final  . Platelets 11/02/2015 241  150 - 400 K/uL Final  . Sodium 11/02/2015 143  135 - 145 mmol/L Final  . Potassium 11/02/2015 3.8  3.5 - 5.1 mmol/L Final  . Chloride 11/02/2015 111  101 - 111 mmol/L Final  . CO2 11/02/2015 23  22 - 32 mmol/L Final  . Glucose, Bld 11/02/2015 157* 65 - 99 mg/dL Final  . BUN 11/02/2015 28* 6 - 20 mg/dL Final  . Creatinine, Ser 11/02/2015 1.08  0.61 - 1.24 mg/dL Final  . Calcium 11/02/2015 8.8* 8.9 - 10.3 mg/dL Final  . GFR calc non Af Amer 11/02/2015 59* >60 mL/min Final  . GFR calc Af Amer 11/02/2015 >60  >60 mL/min Final   Comment: (NOTE) The eGFR has been calculated using the CKD EPI equation. This calculation has not been validated in all clinical situations. eGFR's persistently <60 mL/min signify possible Chronic Kidney Disease.   . Anion gap 11/02/2015 9  5 - 15 Final  . WBC 11/03/2015 8.5  4.0 - 10.5 K/uL Final  . RBC 11/03/2015 3.41* 4.22 - 5.81 MIL/uL Final  . Hemoglobin 11/03/2015 10.1* 13.0 - 17.0 g/dL Final  . HCT 11/03/2015 30.5* 39.0 - 52.0 % Final  . MCV 11/03/2015 89.4  78.0 - 100.0 fL Final  . MCH 11/03/2015 29.6  26.0 - 34.0 pg Final  . MCHC 11/03/2015 33.1  30.0 - 36.0 g/dL Final  . RDW 11/03/2015 14.9  11.5 - 15.5 % Final  . Platelets 11/03/2015 268  150 - 400 K/uL Final  . Sodium 11/03/2015 144  135 - 145 mmol/L Final  . Potassium 11/03/2015 3.3* 3.5 - 5.1 mmol/L Final  . Chloride 11/03/2015 108  101 - 111 mmol/L Final  . CO2 11/03/2015 26  22 - 32 mmol/L Final  . Glucose, Bld 11/03/2015 118* 65 - 99 mg/dL Final  . BUN 11/03/2015 27* 6 - 20 mg/dL Final  . Creatinine, Ser 11/03/2015 0.95  0.61 - 1.24 mg/dL Final  . Calcium 11/03/2015 8.7* 8.9 - 10.3 mg/dL Final  . GFR calc non Af Amer 11/03/2015 >60  >60 mL/min Final  . GFR calc Af Amer 11/03/2015 >60  >60 mL/min Final  Comment: (NOTE) The eGFR has been calculated using the CKD EPI equation. This calculation has not been validated in all clinical  situations. eGFR's persistently <60 mL/min signify possible Chronic Kidney Disease.   . Anion gap 11/03/2015 10  5 - 15 Final  Nursing Home on 10/17/2015  Component Date Value Ref Range Status  . TSH 09/15/2015 5.00  .41 - 5.90 uIU/mL Final    Dg Chest Port 1 View  11/27/2015  CLINICAL DATA:  Hypoxia.  Fever. EXAM: PORTABLE CHEST 1 VIEW COMPARISON:  10/31/2015. FINDINGS: Stable enlarged cardiac silhouette and tortuous, partially calcified thoracic aorta. Interval patchy opacity in the right upper lobe. Unremarkable bones. IMPRESSION: Right upper lobe pneumonia. Electronically Signed   By: Claudie Revering M.D.   On: 11/27/2015 12:38     Assessment/Plan   ICD-9-CM ICD-10-CM   1. Failure to thrive in adult 783.7 R62.7   2. Dysphagia 787.20 R13.10   3. Chronic obstructive pulmonary disease, unspecified COPD type ([place]) 496 J44.9   4. Chronic diastolic CHF (congestive heart failure) ([facility]) 428.32 I50.32    428.0    5. Chronic atrial fibrillation ([facility]) 427.31 I48.2   6. Protein-calorie malnutrition, severe ([place]) 262 E43   7. Hypothyroidism due to acquired atrophy of thyroid 244.8 E03.8    246.8 E03.4   8. Major depressive disorder with single episode, remission status unspecified ([place]) 296.20 F32.9   9.       Memory loss  Cont current meds as ordered. Taper steroids  Nutritional supplements as ordered  [place] O2 ATC  VS qshift  Palliative care  to follow  PT/OT/ST as ordered  GOAL: short term rehab with potential for long term care Communicated with pt and nursing.  Will follow  Khayden Herzberg S. Perlie Gold  [facility] [address] [place],  [address] [phone] Cell (Monday-Friday 8 AM - 5 PM) [phone] After 5 PM and follow prompts

## 2015-12-26 LAB — CBC AND DIFFERENTIAL
HEMATOCRIT: 27 % — AB (ref 41–53)
HEMOGLOBIN: 9.1 g/dL — AB (ref 13.5–17.5)
PLATELETS: 448 10*3/uL — AB (ref 150–399)
WBC: 5.1 10^3/mL

## 2015-12-29 ENCOUNTER — Telehealth: Payer: Self-pay | Admitting: Internal Medicine

## 2015-12-29 NOTE — Telephone Encounter (Signed)
S/w pt's daughter at SNF regarding pt's low grade temps and slow decline. No source has been found for temps. We discussed code status - FULL vs DNR. Recommended she completes MOST form. She will talk with facility regarding form and let me know her decision

## 2016-01-14 LAB — BASIC METABOLIC PANEL
BUN: 12 mg/dL (ref 4–21)
Creatinine: 0.8 mg/dL (ref 0.6–1.3)
Glucose: 85 mg/dL
POTASSIUM: 4.1 mmol/L (ref 3.4–5.3)
Sodium: 138 mmol/L (ref 137–147)

## 2016-01-14 LAB — CBC AND DIFFERENTIAL
HCT: 28 % — AB (ref 41–53)
HEMOGLOBIN: 9.3 g/dL — AB (ref 13.5–17.5)
PLATELETS: 420 10*3/uL — AB (ref 150–399)
WBC: 7.7 10*3/mL

## 2016-01-28 ENCOUNTER — Encounter: Payer: Self-pay | Admitting: Adult Health

## 2016-01-28 ENCOUNTER — Non-Acute Institutional Stay (SKILLED_NURSING_FACILITY): Payer: Medicare Other | Admitting: Adult Health

## 2016-01-28 DIAGNOSIS — J449 Chronic obstructive pulmonary disease, unspecified: Secondary | ICD-10-CM

## 2016-01-28 DIAGNOSIS — F0390 Unspecified dementia without behavioral disturbance: Secondary | ICD-10-CM

## 2016-01-28 DIAGNOSIS — I482 Chronic atrial fibrillation, unspecified: Secondary | ICD-10-CM

## 2016-01-28 DIAGNOSIS — R1314 Dysphagia, pharyngoesophageal phase: Secondary | ICD-10-CM

## 2016-01-28 DIAGNOSIS — R634 Abnormal weight loss: Secondary | ICD-10-CM | POA: Diagnosis not present

## 2016-01-28 NOTE — Progress Notes (Signed)
Patient ID: Todd [name], male   DOB: 1924-08-14, 82 y.o.   MRN: [id number]  Location:  [facility] Room Number: [id number] Place of Service:  SNF [phone]) Provider:   Cindi Carbon, ANP [facility] [phone]    Extended Emergency Contact Information Primary Emergency Contact: Seymour [place] Phone: [phone] Relation: Daughter Secondary Emergency Contact: [place] Mobile Phone: [phone] Relation: Grandson  Code Status:  Full code Goals of care: Advanced Directive information Advanced Directives 01/28/2016  Does patient have an advance directive? No  Type of Advance Directive -  Does patient want to make changes to advanced directive? No - Patient declined  Copy of advanced directive(s) in chart? -     Chief Complaint  Patient presents with  . Medical Management of Chronic Issues    Routine Visit    HPI:  Pt is a 82 y.o. male seen today for medical management of chronic diseases.    1. Loss of weight -lost 11 lbs since Feb -started on Remeron on 3/21 for agitation and sleep, but also had been losing weight  2. Chronic atrial fibrillation (HCC) -rate controlled without meds -on xarelto, no reports of bleeding -H/H stable  3. Chronic obstructive pulmonary disease, unspecified COPD type ([place]) -on advair and duonebs prn -stable with oxygen use at 2L -no PFTs for review -treated for exacerbation in Feb with prednisone, no issue after taper  4. Dementia, without behavioral disturbance -forgetful of the details of his care, needs assistant for all ADLs, non ambulatory -not on meds  5. Dysphagia -noted on MBBS with recommendation for D3 diet and HTL, changed to mech soft at the facility with no reported concerns   Past Medical History  Diagnosis Date  . Asthma   . Hypertension   . A-fib ([place])   . Hyperlipidemia   . Shortness of breath   . CHF (congestive  heart failure) ([place]) 10/28/2011  . Forgetfulness   . PVC's (premature ventricular contractions)   . Mild aortic insufficiency   . Dementia     [name] 02/05/2015  . Failure to thrive in adult     /notes 02/05/2015  . COPD (chronic obstructive pulmonary disease) ([place])   . Hypothyroidism     [name] 02/05/2015  . Fall 02/04/2015    with altered mental status off of his baseline after he suffered a mechanical fall /notes 02/04/2015  . CAP (community acquired pneumonia)     [name] 02/05/2015  . Colon cancer [facility])    Past Surgical History  Procedure Laterality Date  . Colon surgery      Partial hemecolectomy   . Central line insertion  09/08/2011         No Known Allergies    Medication List       This list is accurate as of: 01/28/16 12:42 PM.  Always use your most recent med list.               acetaminophen 325 MG tablet  Commonly known as:  TYLENOL  Take 650 mg by mouth every 6 (six) hours as needed for mild pain.     ADVAIR DISKUS 500-50 MCG/DOSE Aepb  Generic drug:  Fluticasone-Salmeterol  INHALE 1 PUFF TWICE A DAY     ARTIFICIAL TEAR SOLUTION OP  Apply to eye. Instill 2 drops into both eyes four times daily as needed     ferrous sulfate 325 (65 FE) MG tablet  TAKE 1 TABLET BY MOUTH EVERY DAY     fluticasone 50 MCG/ACT nasal spray  Commonly known as:  FLONASE  Place 2 sprays into both nostrils daily.     guaiFENesin 200 MG tablet  Take 200 mg by mouth every 4 (four) hours as needed for cough or to loosen phlegm.     ipratropium-albuterol 0.5-2.5 (3) MG/3ML Soln  Commonly known as:  DUONEB  Take 3 mLs by nebulization every 6 (six) hours as needed (SOB/Wheezing).     levalbuterol 0.63 MG/3ML nebulizer solution  Commonly known as:  XOPENEX  Take 3 mLs (0.63 mg total) by nebulization every 4 (four) hours as needed for wheezing or shortness of breath.     levothyroxine 25 MCG tablet  Commonly known as:  SYNTHROID, LEVOTHROID  TAKE 1 TABLET BY MOUTH EVERY DAY      losartan 50 MG tablet  Commonly known as:  COZAAR  Take 50 mg by mouth daily.     mirtazapine 7.5 MG tablet  Commonly known as:  REMERON  Take 7.5 mg by mouth at bedtime.     omeprazole 40 MG capsule  Commonly known as:  PRILOSEC  Take 40 mg by mouth daily.     potassium chloride SA 20 MEQ tablet  Commonly known as:  K-DUR,KLOR-CON  Take 40 mEq by mouth daily.     pravastatin 20 MG tablet  Commonly known as:  PRAVACHOL  Take 20 mg by mouth daily.     rivaroxaban 20 MG Tabs tablet  Commonly known as:  XARELTO  Take 1 tablet (20 mg total) by mouth daily with supper.     tamsulosin 0.4 MG Caps capsule  Commonly known as:  FLOMAX  Take 1 capsule (0.4 mg total) by mouth daily.     venlafaxine XR 37.5 MG 24 hr capsule  Commonly known as:  EFFEXOR-XR  Take 75 mg by mouth daily with breakfast.        Review of Systems  Constitutional: Positive for appetite change and unexpected weight change. Negative for fever, diaphoresis, activity change and fatigue.  HENT: Negative for congestion.   Respiratory: Negative for cough and shortness of breath.   Cardiovascular: Negative for chest pain, palpitations and leg swelling.  Gastrointestinal: Negative for diarrhea, constipation and abdominal distention.  Musculoskeletal: Positive for gait problem.  Psychiatric/Behavioral: Positive for confusion and agitation. Negative for behavioral problems.    Immunization History  Administered Date(s) Administered  . Influenza Whole 07/02/2009, 09/10/2011  . Influenza,inj,Quad PF,36+ Mos 08/28/2013, 09/05/2014  . Influenza-Unspecified 08/12/2015  . PPD Test 12/08/2015  . Pneumococcal Conjugate-13 09/05/2014  . Pneumococcal Polysaccharide-23 11/01/2006  . Zoster 08/26/2006   Pertinent  Health Maintenance Due  Topic Date Due  . INFLUENZA VACCINE  06/01/2016  . PNA vac Low Risk Adult  Completed   No flowsheet data found. Functional Status Survey:    Filed Vitals:   01/28/16 1028  BP:  114/69  Pulse: 60  Temp: 97.4 F (36.3 C)  TempSrc: Oral  Resp: 20  Height: 5\' 6"  (1.676 m)  Weight: 127 lb 12.8 oz (57.97 kg)   Body mass index is 20.64 kg/(m^2).  Wt Readings from Last 3 Encounters:  01/28/16 127 lb 12.8 oz (57.97 kg)  12/18/15 136 lb (61.689 kg)  12/04/15 138 lb (62.596 kg)    Physical Exam  Constitutional: No distress.  HENT:  Head: Normocephalic and atraumatic.  Neck: Normal range of motion. Neck supple.  Cardiovascular:  Irregular, no edema  Pulmonary/Chest: Effort  normal and breath sounds normal.  Decreased bases with barrel chest  Abdominal: Soft. Bowel sounds are normal.  Neurological: He is alert.  Oriented to self and situation  Skin: Skin is warm and dry. He is not diaphoretic.  Psychiatric: He has a normal mood and affect.    Labs reviewed:  Recent Labs  03/29/15 0737  03/31/15 0305  12/01/15 0334 12/02/15 0808 12/03/15 0707 12/04/15 0537 01/14/16  NA 138  < > 142  < > 144 144 141 142 138  K 3.6  < > 5.1  < > 3.4* 3.4* 3.2* 3.5 4.1  CL 101  < > 110  < > 108 106 103 104  --   CO2 21*  < > 23  < > 28 28 32 30  --   GLUCOSE 135*  < > 144*  < > 125* 86 95 96  --   BUN 12  < > 31*  < > 23* 20 21* 25* 12  CREATININE 0.92  < > 1.09  < > 0.91 0.83 0.82 0.88 0.8  CALCIUM 8.8*  < > 9.6  < > 9.0 9.2 9.1 8.9  --   MG 1.9  --  2.1  --  1.8  --   --   --   --   < > = values in this interval not displayed.  Recent Labs  03/31/15 0305 10/31/15 1350 11/27/15 1212  AST 35 15 20  ALT 12* 11* 13*  ALKPHOS 89 72 94  BILITOT 1.6* 0.5 0.1*  PROT 6.5 5.1* 7.3  ALBUMIN 3.1* 2.5* 3.5    Recent Labs  04/14/15 2000  10/31/15 1350  11/27/15 1212  12/02/15 0808 12/03/15 0707 12/04/15 0537 12/26/15 01/14/16  WBC 7.3  < > 6.4  < > 11.1*  < > 7.6 8.6 9.8 5.1 7.7  NEUTROABS 5.0  --  3.9  --  7.4  --   --   --   --   --   --   HGB 10.7*  < > 8.8*  < > 12.1*  < > 10.6* 11.1* 11.0* 9.1* 9.3*  HCT 32.5*  < > 26.3*  < > 37.6*  < > 32.0* 32.7* 33.3*  27* 28*  MCV 90.8  --  91.6  < > 95.2  < > 89.4 90.1 90.7  --   --   PLT 277  < > 213  < > 382  < > 330 307 302 448* 420*  < > = values in this interval not displayed. Lab Results  Component Value Date   TSH 5.00 09/15/2015   Lab Results  Component Value Date   HGBA1C 6.1* 09/06/2014   Lab Results  Component Value Date   CHOL 138 04/10/2015   HDL 38 04/10/2015   LDLCALC 81 04/10/2015   TRIG 97 04/10/2015   CHOLHDL 3.6 10/04/2014    Significant Diagnostic Results in last 30 days:  No results found.  Assessment/Plan 1. Loss of weight -noted, may be due to diet change as well as functional/cognitive decline, and COPD -continue Remeron at 7.5 mg and increase to 15 mg in 2 weeks -check TSH  2. Chronic atrial fibrillation (HCC) -rate controlled  -on xarelto for CVA risk reduction  3. Chronic obstructive pulmonary disease, unspecified COPD type ([place]) -stable, on 02 at 2L chronically -currently on advair 1 puff BID, would consider changing to nebs if symptoms occur as I am not sure he is able to use the device consistently  4. Dementia, without behavioral disturbance -noted with some agitation at night, improved with remeron -could consider namenda but given his overall poor health it may not be beneficial unless his behavior becomes an issue  5. Dysphagia, pharyngoesophageal phase -continue mech soft diet with NTL -aspiration prec    Labs/tests ordered:  TSH  Cindi Carbon, ANP [facility] [phone]

## 2016-01-30 LAB — TSH: TSH: 5.1 u[IU]/mL (ref 0.41–5.90)

## 2016-02-21 LAB — BASIC METABOLIC PANEL
BUN: 15 mg/dL (ref 4–21)
Creatinine: 1 mg/dL (ref 0.6–1.3)
GLUCOSE: 82 mg/dL
POTASSIUM: 4.4 mmol/L (ref 3.4–5.3)
SODIUM: 142 mmol/L (ref 137–147)

## 2016-02-21 LAB — CBC AND DIFFERENTIAL
HEMATOCRIT: 28 % — AB (ref 41–53)
Hemoglobin: 9 g/dL — AB (ref 13.5–17.5)
Platelets: 331 10*3/uL (ref 150–399)
WBC: 5.7 10^3/mL

## 2016-02-23 ENCOUNTER — Encounter: Payer: Self-pay | Admitting: Internal Medicine

## 2016-02-23 ENCOUNTER — Non-Acute Institutional Stay (SKILLED_NURSING_FACILITY): Payer: Medicare Other | Admitting: Internal Medicine

## 2016-02-23 DIAGNOSIS — J9611 Chronic respiratory failure with hypoxia: Secondary | ICD-10-CM | POA: Diagnosis not present

## 2016-02-23 DIAGNOSIS — L989 Disorder of the skin and subcutaneous tissue, unspecified: Secondary | ICD-10-CM

## 2016-02-23 DIAGNOSIS — E43 Unspecified severe protein-calorie malnutrition: Secondary | ICD-10-CM | POA: Diagnosis not present

## 2016-02-23 DIAGNOSIS — R1310 Dysphagia, unspecified: Secondary | ICD-10-CM

## 2016-02-23 DIAGNOSIS — I482 Chronic atrial fibrillation, unspecified: Secondary | ICD-10-CM

## 2016-02-23 DIAGNOSIS — I10 Essential (primary) hypertension: Secondary | ICD-10-CM

## 2016-02-23 DIAGNOSIS — E785 Hyperlipidemia, unspecified: Secondary | ICD-10-CM | POA: Diagnosis not present

## 2016-02-23 DIAGNOSIS — E034 Atrophy of thyroid (acquired): Secondary | ICD-10-CM

## 2016-02-23 DIAGNOSIS — I5032 Chronic diastolic (congestive) heart failure: Secondary | ICD-10-CM

## 2016-02-23 DIAGNOSIS — E038 Other specified hypothyroidism: Secondary | ICD-10-CM

## 2016-02-23 DIAGNOSIS — R627 Adult failure to thrive: Secondary | ICD-10-CM | POA: Diagnosis not present

## 2016-02-23 DIAGNOSIS — J449 Chronic obstructive pulmonary disease, unspecified: Secondary | ICD-10-CM

## 2016-02-23 DIAGNOSIS — F0390 Unspecified dementia without behavioral disturbance: Secondary | ICD-10-CM

## 2016-02-23 NOTE — Progress Notes (Addendum)
Patient ID: DAGEM LAZER, male   DOB: 1924/01/06, 82 y.o.   MRN: [id number]    DATE: 02/23/16  Location:  [facility] Room Number: [id number] Place of Service: SNF (31)   Extended Emergency Contact Information Primary Emergency Contact: [place] of [place] Phone: [phone] Relation: Daughter Secondary Emergency Contact: Chrismon,Andy  [place] Mobile Phone: [phone] Relation: Grandson  Advanced Directive information Does patient have an advance directive?: No, Would patient like information on creating an advanced directive?: No - patient declined information, Does patient want to make changes to advanced directive?: No - Patient declined [place]  Chief Complaint  Patient presents with  . Medical Management of Chronic Issues    Routine Visit    HPI:  82 yo male short term resident seen today for f/u. POA present. C/a waxing and waning memory/cognition. He gets confused adn lasting > few days now. Last night, pt fell OOB "while getting my glasses" off bedside stand per nursing. He sustained left elbow skin tear. Fall unwitnessed. Occasional loud outbursts. He has skin lesion on left shoulder x unknown duration that does not hurt. No bleeding. Hx skin cancer. Has seen derm Dr Nevada Crane in the past. Pt is a poor historian due to dementia. Hx obtained from chart and POA  CHF/afib - stable. Rate controlled . He takes xeralto. BP stable on losartan  COPD/chronic respiratory failure - stable on advair, mucinex dm, nebs, flonase. O2 dependent  FTT/severe protein cal malnutrition - takes nutritional supplements per facility protocol. He also takes vitamins and minerals. Appetite remains poor despite taking remeron x few weeks. Weight down approx 12 lbs since Feb 2017  Mood d/o/dementia - mood stable on effexor xr. Dementia  - he does not take any meds  Hyperlipidemia - stable on pravastatin.  Dysphagia/GERD - stable  on omeprazole  Hypothyroid - stable on levothyroxine  BPH - stable on flomax  Arthritis - pain stable on tylenol prn  Hx colon ca - no known recurrence  Anemia - stable with Hgb 9  HOH is significant  Past Medical History  Diagnosis Date  . Asthma   . Hypertension   . A-fib ([place])   . Hyperlipidemia   . Shortness of breath   . CHF (congestive heart failure) ([place]) 10/28/2011  . Forgetfulness   . PVC's (premature ventricular contractions)   . Mild aortic insufficiency   . Dementia     [name] 02/05/2015  . Failure to thrive in adult     /notes 02/05/2015  . COPD (chronic obstructive pulmonary disease) ([place])   . Hypothyroidism     [name] 02/05/2015  . Fall 02/04/2015    with altered mental status off of his baseline after he suffered a mechanical fall /notes 02/04/2015  . CAP (community acquired pneumonia)     [name] 02/05/2015  . Colon cancer [facility])     Past Surgical History  Procedure Laterality Date  . Colon surgery      Partial hemecolectomy   . Central line insertion  09/08/2011         Patient Care Team: Susy Frizzle, MD as PCP - General (Family Medicine)  Social History   Social History  . Marital Status: Widowed    Spouse Name: N/A  . Number of Children: 2  . Years of Education: 12   Occupational History  . 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 Smokeless tobacco: Not on file     Comment: quit 30+ yrs ago  . Alcohol Use: No  . Drug Use: No  . Sexual Activity: No   Other Topics Concern  . Not on file   Social History Narrative   [place] man. 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 Widowed February 11, 2014. Had a son who died at 47. Has a daughter, 2 grandchildren, 2 great-grandchildren. Reports that he was living with his daughter     reports that he has quit smoking. He does not have any smokeless tobacco history on  file. He reports that he does not drink alcohol or use illicit drugs.  Immunization History  Administered Date(s) Administered  . Influenza Whole 07/02/2009, 09/10/2011  . Influenza,inj,Quad PF,36+ Mos 08/28/2013, 09/05/2014  . Influenza-Unspecified 08/12/2015  . PPD Test 12/08/2015  . Pneumococcal Conjugate-13 09/05/2014  . Pneumococcal Polysaccharide-23 11/01/2006  . Zoster 08/26/2006    No Known Allergies  Medications: Patient's Medications  New Prescriptions   No medications on file  Previous Medications   ACETAMINOPHEN (TYLENOL) 325 MG TABLET    Take 650 mg by mouth every 6 (six) hours as needed for mild pain.   ADVAIR DISKUS 500-50 MCG/DOSE AEPB    INHALE 1 PUFF TWICE A DAY   AMBULATORY NON FORMULARY MEDICATION    Magic cup three times daily.   ARTIFICIAL TEAR SOLUTION OP    Apply to eye. Instill 2 drops into both eyes four times daily as needed   FERROUS SULFATE 325 (65 FE) MG TABLET    TAKE 1 TABLET BY MOUTH EVERY DAY   FLUTICASONE (FLONASE) 50 MCG/ACT NASAL SPRAY    Place 2 sprays into both nostrils daily.   GUAIFENESIN 200 MG TABLET    Take 200 mg by mouth every 4 (four) hours as needed for cough or to loosen phlegm.   IPRATROPIUM-ALBUTEROL (DUONEB) 0.5-2.5 (3) MG/3ML SOLN    Take 3 mLs by nebulization every 6 (six) hours as needed (SOB/Wheezing).    LEVALBUTEROL (XOPENEX) 0.63 MG/3ML NEBULIZER SOLUTION    Take 3 mLs (0.63 mg total) by nebulization every 4 (four) hours as needed for wheezing or shortness of breath.   LEVOTHYROXINE (SYNTHROID, LEVOTHROID) 25 MCG TABLET    TAKE 1 TABLET BY MOUTH EVERY DAY   LOSARTAN (COZAAR) 50 MG TABLET    Take 50 mg by mouth daily.   MIRTAZAPINE (REMERON) 15 MG TABLET    Take 15 mg by mouth at bedtime.   NUTRITIONAL SUPPLEMENTS (ENSURE CLEAR PO)    Take 1 Can by mouth daily.   OMEPRAZOLE (PRILOSEC) 40 MG CAPSULE    Take 40 mg by mouth daily.   POTASSIUM CHLORIDE SA (K-DUR,KLOR-CON) 20 MEQ TABLET    Take 40 mEq by mouth daily.    PRAVASTATIN (PRAVACHOL) 20 MG TABLET    Take 20 mg by mouth daily.   RIVAROXABAN (XARELTO) 20 MG TABS TABLET    Take 1 tablet (20 mg total) by mouth daily with supper.   TAMSULOSIN (FLOMAX) 0.4 MG CAPS CAPSULE    Take 1 capsule (0.4 mg total) by mouth daily.   VENLAFAXINE XR (EFFEXOR-XR) 37.5 MG 24 HR CAPSULE    Take 75 mg by mouth daily with breakfast.  Modified Medications   No medications on file  Discontinued Medications   MIRTAZAPINE (REMERON) 7.5 MG TABLET    Take 7.5 mg by  mouth at bedtime.    Review of Systems  Unable to perform ROS: Dementia    Filed Vitals:   02/23/16 1406  BP: 147/79  Pulse: 69  Temp: 97.8 F (36.6 C)  TempSrc: Oral  Resp: 16  Height: 5\' 6"  (1.676 m)  Weight: 126 lb 9.6 oz (57.425 kg)   Body mass index is 20.44 kg/(m^2).  Physical Exam  Constitutional: He appears well-developed.  Frail appearing in NAD. [place] O2 intact. lying in bed. POA present  HENT:  Mouth/Throat: Oropharynx is clear and moist.  HOH L>R  Eyes: Pupils are equal, round, and reactive to light. No scleral icterus.  Neck: Neck supple. Carotid bruit is not present. No thyromegaly present.  Cardiovascular: Normal rate and intact distal pulses.  An irregularly irregular rhythm present. Exam reveals no gallop and no friction rub.   Murmur heard.  Systolic murmur is present with a grade of 1/6  +1 pitting LE edema b/l. No calf TTP  Pulmonary/Chest: Effort normal. No accessory muscle usage. No respiratory distress. He has decreased breath sounds (b/l at base). He has no wheezes. He has no rhonchi. He has no rales. He exhibits no tenderness.  Abdominal: Soft. Bowel sounds are normal. He exhibits no distension, no abdominal bruit, no pulsatile midline mass and no mass. There is no tenderness. There is no rebound and no guarding.  Musculoskeletal: He exhibits edema.  Small and large joint deformities  Lymphadenopathy:    He has no cervical adenopathy.  Neurological: He is alert.  Skin: Skin  is warm and dry. No rash noted.  Psychiatric: He has a normal mood and affect. His behavior is normal.     Labs reviewed: Nursing Home on 02/23/2016  Component Date Value Ref Range Status  . Hemoglobin 02/21/2016 9.0* 13.5 - 17.5 g/dL Final  . HCT 02/21/2016 28* 41 - 53 % Final  . Platelets 02/21/2016 331  150 - 399 K/L Final  . WBC 02/21/2016 5.7   Final  . Glucose 02/21/2016 82   Final  . BUN 02/21/2016 15  4 - 21 mg/dL Final  . Creatinine 02/21/2016 1.0  0.6 - 1.3 mg/dL Final  . Potassium 02/21/2016 4.4  3.4 - 5.3 mmol/L Final  . Sodium 02/21/2016 142  137 - 147 mmol/L Final  . TSH 01/30/2016 5.10  0.41 - 5.90 uIU/mL Final  Nursing Home on 01/28/2016  Component Date Value Ref Range Status  . Hemoglobin 01/14/2016 9.3* 13.5 - 17.5 g/dL Final  . HCT 01/14/2016 28* 41 - 53 % Final  . Platelets 01/14/2016 420* 150 - 399 K/L Final  . WBC 01/14/2016 7.7   Final  . Glucose 01/14/2016 85   Final  . BUN 01/14/2016 12  4 - 21 mg/dL Final  . Creatinine 01/14/2016 0.8  0.6 - 1.3 mg/dL Final  . Potassium 01/14/2016 4.1  3.4 - 5.3 mmol/L Final  . Sodium 01/14/2016 138  137 - 147 mmol/L Final  . Hemoglobin 12/26/2015 9.1* 13.5 - 17.5 g/dL Final  . HCT 12/26/2015 27* 41 - 53 % Final  . Platelets 12/26/2015 448* 150 - 399 K/L Final  . WBC 12/26/2015 5.1   Final  Admission on 11/27/2015, Discharged on 12/04/2015  No results displayed because visit has over 200 results.      No results found.   Assessment/Plan   ICD-9-CM ICD-10-CM   1. Dementia, without behavioral disturbance 294.20 F03.90   2. Dysphagia 787.20 R13.10   3. Chronic obstructive pulmonary disease, unspecified COPD type (  [place]) 496 J44.9   4. Chronic atrial fibrillation (HCC) 427.31 I48.2   5. Chronic respiratory failure with hypoxia (HCC) 518.83 J96.11    799.02    6. Chronic diastolic CHF (congestive heart failure) (HCC) 428.32 I50.32    428.0    7. Failure to thrive in adult 783.7 R62.7   8. Hypothyroidism due to  acquired atrophy of thyroid 244.8 E03.8    246.8 E03.4   9. HLD (hyperlipidemia) 272.4 E78.5   10. Protein-calorie malnutrition, severe ([place]) 262 E43   11. Essential hypertension 401.9 I10   12. Skin lesions 709.9 L98.9    AK, cutaneous horn left shoulder   S/w POA, will consider addition of aricept vs namzeric to improve cognition/memory. BIMS score 4 out 15, s/o severe dementia.  Discussed code status with POA. She has not completed MOSt form yet but plans to do so soon. He remains FULL CODE. She is thinking about arranging to bring him home with [facility] also but is still researching various care options. Explained that he is total care and will likely require private duty nurse also.  Fall precautions  Cont current meds as ordered  POA will set up dermatology appt to further eval skin lesions  Nutritional supplements as ordered  PT/OT/ST as indicated  Will follow  Amaan Meyer S. Perlie Gold  [facility] [address] [place], [place] [address] [phone] Cell (Monday-Friday 8 AM - 5 PM) [phone] After 5 PM and follow prompts

## 2016-03-22 ENCOUNTER — Encounter: Payer: Self-pay | Admitting: Internal Medicine

## 2016-03-22 ENCOUNTER — Non-Acute Institutional Stay (SKILLED_NURSING_FACILITY): Payer: Medicare Other | Admitting: Internal Medicine

## 2016-03-22 DIAGNOSIS — R1310 Dysphagia, unspecified: Secondary | ICD-10-CM

## 2016-03-22 DIAGNOSIS — J9611 Chronic respiratory failure with hypoxia: Secondary | ICD-10-CM

## 2016-03-22 DIAGNOSIS — E034 Atrophy of thyroid (acquired): Secondary | ICD-10-CM | POA: Diagnosis not present

## 2016-03-22 DIAGNOSIS — F329 Major depressive disorder, single episode, unspecified: Secondary | ICD-10-CM | POA: Diagnosis not present

## 2016-03-22 DIAGNOSIS — I482 Chronic atrial fibrillation, unspecified: Secondary | ICD-10-CM

## 2016-03-22 DIAGNOSIS — I10 Essential (primary) hypertension: Secondary | ICD-10-CM

## 2016-03-22 DIAGNOSIS — F0390 Unspecified dementia without behavioral disturbance: Secondary | ICD-10-CM

## 2016-03-22 DIAGNOSIS — R627 Adult failure to thrive: Secondary | ICD-10-CM

## 2016-03-22 DIAGNOSIS — J449 Chronic obstructive pulmonary disease, unspecified: Secondary | ICD-10-CM | POA: Diagnosis not present

## 2016-03-22 DIAGNOSIS — I5032 Chronic diastolic (congestive) heart failure: Secondary | ICD-10-CM | POA: Diagnosis not present

## 2016-03-22 DIAGNOSIS — E038 Other specified hypothyroidism: Secondary | ICD-10-CM

## 2016-03-22 NOTE — Progress Notes (Signed)
DATE: 03/22/16  Location:  [facility] Room Number: [id number] Place of Service: SNF (31)   Extended Emergency Contact Information Primary Emergency Contact: Seymour Bars [place] Phone: [phone] Relation: Daughter Secondary Emergency Contact: Chrismon,Andy  [place] Mobile Phone: [phone] Relation: Grandson  Advanced Directive information Does patient have an advance directive?: No FULL CODE Chief Complaint  Patient presents with  . Medical Management of Chronic Issues    Routine Visit    HPI:  82 yo male long term resident seen today for f/u. He has no c/o today. He is HOH. He reports appetite ok although he has lost 1 lb since last month per chart. He was treated for right sided pneumonia dx by CXR on 03/10/16. He completed 10 days of Augmentin/floraster tx. He is a poor historian due to dementia. Hx obtained from chart and nursing  CHF/afib - stable. Rate controlled . He takes xeralto. BP stable on losartan  COPD/chronic respiratory failure - currently stable on advair, mucinex dm, nebs, flonase. O2 dependent  FTT/severe protein cal malnutrition - takes nutritional supplements per facility protocol. He also takes vitamins and minerals. Appetite remains poor despite taking remeron x few weeks. Weight down 28 lbs since May 2016. POA has been resistant to change Code status  Mood d/o/dementia - mood stable on effexor xr. Dementia  - he does not take any meds  Hyperlipidemia - stable on pravastatin. LDL 81 in June 2016  Dysphagia/GERD - stable on omeprazole  Hypothyroid - stable on levothyroxine. TSH 5.1  BPH - stable on flomax  Arthritis - pain stable on tylenol prn  Hx colon ca - no known recurrence  Anemia - stable with Hgb 9  HOH is significant  Past Medical History  Diagnosis Date  . Asthma   . Hypertension   . A-fib ([place])   . Hyperlipidemia   . Shortness of breath   . CHF  (congestive heart failure) ([place]) 10/28/2011  . Forgetfulness   . PVC's (premature ventricular contractions)   . Mild aortic insufficiency   . Dementia     [name] 02/05/2015  . Failure to thrive in adult     /notes 02/05/2015  . COPD (chronic obstructive pulmonary disease) ([place])   . Hypothyroidism     [name] 02/05/2015  . Fall 02/04/2015    with altered mental status off of his baseline after he suffered a mechanical fall /notes 02/04/2015  . CAP (community acquired pneumonia)     [name] 02/05/2015  . Colon cancer [facility])     Past Surgical History  Procedure Laterality Date  . Colon surgery      Partial hemecolectomy   . Central line insertion  09/08/2011         Patient Care Team: Susy Frizzle, MD as PCP - General (Family Medicine)  Social History   Social History  . Marital Status: Widowed    Spouse Name: N/A  . Number of Children: 2  . Years of Education: 12   Occupational History  . 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 Smokeless tobacco: Not on file     Comment: quit 30+ yrs ago  . Alcohol Use: No  . Drug Use: No  . Sexual Activity: No   Other Topics Concern  . Not on file   Social History Narrative   Ederly man. 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 Widowed 02-19-2014. Had a son who died at 41. Has a daughter, 2 grandchildren, 2 great-grandchildren. Reports that he was living with his daughter     reports that he has quit smoking. He does not have any smokeless tobacco history on file. He reports that he does not drink alcohol or use illicit drugs.  Immunization History  Administered Date(s) Administered  . Influenza Whole 07/02/2009, 09/10/2011  . Influenza,inj,Quad PF,36+ Mos 08/28/2013, 09/05/2014  . Influenza-Unspecified 08/12/2015  . PPD Test 12/08/2015  . Pneumococcal Conjugate-13 09/05/2014  . Pneumococcal  Polysaccharide-23 11/01/2006  . Zoster 08/26/2006    No Known Allergies  Medications: Patient's Medications  New Prescriptions   No medications on file  Previous Medications   ACETAMINOPHEN (TYLENOL) 325 MG TABLET    Take 650 mg by mouth every 6 (six) hours as needed for mild pain or fever.    ADVAIR DISKUS 500-50 MCG/DOSE AEPB    INHALE 1 PUFF TWICE A DAY   AMBULATORY NON FORMULARY MEDICATION    Magic cup three times daily.   ARTIFICIAL TEAR SOLUTION OP    Apply to eye. Instill 2 drops into both eyes four times daily as needed   FERROUS SULFATE 325 (65 FE) MG TABLET    TAKE 1 TABLET BY MOUTH EVERY DAY   FLUTICASONE (FLONASE) 50 MCG/ACT NASAL SPRAY    Place 2 sprays into both nostrils daily.   GUAIFENESIN 200 MG TABLET    Take 200 mg by mouth every 4 (four) hours as needed for cough or to loosen phlegm.   IPRATROPIUM-ALBUTEROL (DUONEB) 0.5-2.5 (3) MG/3ML SOLN    Take 3 mLs by nebulization every 6 (six) hours as needed (SOB/Wheezing).    LEVALBUTEROL (XOPENEX) 0.63 MG/3ML NEBULIZER SOLUTION    Take 3 mLs (0.63 mg total) by nebulization every 4 (four) hours as needed for wheezing or shortness of breath.   LEVOTHYROXINE (SYNTHROID, LEVOTHROID) 25 MCG TABLET    TAKE 1 TABLET BY MOUTH EVERY DAY   LOSARTAN (COZAAR) 50 MG TABLET    Take 50 mg by mouth daily.   MIRTAZAPINE (REMERON) 15 MG TABLET    Take 15 mg by mouth at bedtime.   OMEPRAZOLE (PRILOSEC) 40 MG CAPSULE    Take 40 mg by mouth daily.   POTASSIUM CHLORIDE SA (K-DUR,KLOR-CON) 20 MEQ TABLET    Take 40 mEq by mouth daily.   PRAVASTATIN (PRAVACHOL) 20 MG TABLET    Take 20 mg by mouth daily.   RIVAROXABAN (XARELTO) 20 MG TABS TABLET    Take 1 tablet (20 mg total) by mouth daily with supper.   TAMSULOSIN (FLOMAX) 0.4 MG CAPS CAPSULE    Take 1 capsule (0.4 mg total) by mouth daily.   VENLAFAXINE XR (EFFEXOR-XR) 37.5 MG 24 HR CAPSULE    Take 75 mg by mouth daily with breakfast.  Modified Medications   No medications on file  Discontinued  Medications   NUTRITIONAL SUPPLEMENTS (ENSURE CLEAR PO)    Take 1 Can by mouth daily. Reported on 03/22/2016    Review of Systems  Unable to perform ROS: Dementia  and HOH  Filed Vitals:   03/22/16 0942  BP: 140/74  Pulse: 70  Temp: 98 F (36.7 C)  TempSrc: Oral  Resp: 20  Height: 5\' 6"  (1.676 m)  Weight: 125 lb 9.6 oz (56.972 kg)   Body mass index is 20.28 kg/(m^2).  Physical Exam  Constitutional: He appears well-developed.  Frail appearing in NAD. [place] O2 intact. lying in bed.  HENT:  Mouth/Throat: Oropharynx is clear and moist.  HOH L>R  Eyes: Pupils are equal, round, and reactive to light. No scleral icterus.  Neck: Neck supple. Carotid bruit is not present. No thyromegaly present.  Cardiovascular: Normal rate and intact distal pulses.  An irregularly irregular rhythm present. Exam reveals no gallop and no friction rub.   Murmur heard.  Systolic murmur is present with a grade of 1/6  +1 pitting LE edema b/l. No calf TTP  Pulmonary/Chest: Effort normal. No accessory muscle usage. No respiratory distress. He has decreased breath sounds (b/l at base). He has wheezes (end expiratory). He has no rhonchi. He has no rales. He exhibits no tenderness.  Abdominal: Soft. Bowel sounds are normal. He exhibits no distension, no abdominal bruit, no pulsatile midline mass and no mass. There is no tenderness. There is no rebound and no guarding.  Musculoskeletal: He exhibits edema.  Small and large joint deformities  Lymphadenopathy:    He has no cervical adenopathy.  Neurological: He is alert.  Skin: Skin is warm and dry. No rash noted.  Psychiatric: He has a normal mood and affect. His behavior is normal.     Labs reviewed: Nursing Home on 02/23/2016  Component Date Value Ref Range Status  . Hemoglobin 02/21/2016 9.0* 13.5 - 17.5 g/dL Final  . HCT 02/21/2016 28* 41 - 53 % Final  . Platelets 02/21/2016 331  150 - 399 K/L Final  . WBC 02/21/2016 5.7   Final  . Glucose 02/21/2016  82   Final  . BUN 02/21/2016 15  4 - 21 mg/dL Final  . Creatinine 02/21/2016 1.0  0.6 - 1.3 mg/dL Final  . Potassium 02/21/2016 4.4  3.4 - 5.3 mmol/L Final  . Sodium 02/21/2016 142  137 - 147 mmol/L Final  . TSH 01/30/2016 5.10  0.41 - 5.90 uIU/mL Final  Nursing Home on 01/28/2016  Component Date Value Ref Range Status  . Hemoglobin 01/14/2016 9.3* 13.5 - 17.5 g/dL Final  . HCT 01/14/2016 28* 41 - 53 % Final  . Platelets 01/14/2016 420* 150 - 399 K/L Final  . WBC 01/14/2016 7.7   Final  . Glucose 01/14/2016 85   Final  . BUN 01/14/2016 12  4 - 21 mg/dL Final  . Creatinine 01/14/2016 0.8  0.6 - 1.3 mg/dL Final  . Potassium 01/14/2016 4.1  3.4 - 5.3 mmol/L Final  . Sodium 01/14/2016 138  137 - 147 mmol/L Final  . Hemoglobin 12/26/2015 9.1* 13.5 - 17.5 g/dL Final  . HCT 12/26/2015 27* 41 - 53 % Final  . Platelets 12/26/2015 448* 150 - 399 K/L Final  . WBC 12/26/2015 5.1   Final    No results found.   Assessment/Plan   ICD-9-CM ICD-10-CM   1. Chronic obstructive pulmonary disease, unspecified COPD type ([place]) 496 J44.9   2. Chronic respiratory failure with hypoxia (HCC) 518.83 J96.11    799.02    3. Failure to thrive in adult 783.7 R62.7   4. Chronic diastolic CHF (congestive heart failure) (HCC) 428.32 I50.32    428.0    5. Dementia, without behavioral disturbance 294.20 F03.90   6. Dysphagia 787.20 R13.10   7. Chronic atrial fibrillation (HCC) 427.31 I48.2   8. Essential hypertension 401.9 I10   9. Hypothyroidism due to acquired atrophy of thyroid 244.8 E03.8    246.8 E03.4   10. Major depressive disorder  with single episode, remission status unspecified ([place]) 296.20 F32.9    Check lipid panel  Cont current meds as as ordered  [place] O2 as ordered  Aspiration precautions  PT/OT/ST as indicated  Will follow  Brynley Cuddeback S. Perlie Gold  [facility] and [facility] [address] [place], [place] [address] [phone] Cell (Monday-Friday 8  AM - 5 PM) [phone] After 5 PM and follow prompts

## 2016-03-23 LAB — LIPID PANEL
CHOLESTEROL: 121 mg/dL (ref 0–200)
HDL: 38 mg/dL (ref 35–70)
LDL Cholesterol: 58 mg/dL
Triglycerides: 123 mg/dL (ref 40–160)

## 2016-04-03 LAB — TSH: TSH: 6.1 u[IU]/mL — AB (ref 0.41–5.90)

## 2016-04-15 ENCOUNTER — Encounter: Payer: Self-pay | Admitting: Internal Medicine

## 2016-04-15 ENCOUNTER — Non-Acute Institutional Stay (SKILLED_NURSING_FACILITY): Payer: Medicare Other | Admitting: Internal Medicine

## 2016-04-15 DIAGNOSIS — R21 Rash and other nonspecific skin eruption: Secondary | ICD-10-CM

## 2016-04-15 DIAGNOSIS — H109 Unspecified conjunctivitis: Secondary | ICD-10-CM

## 2016-04-15 DIAGNOSIS — F0390 Unspecified dementia without behavioral disturbance: Secondary | ICD-10-CM | POA: Diagnosis not present

## 2016-04-15 NOTE — Progress Notes (Signed)
Patient ID: Todd [name], male   DOB: 1924-07-08, 82 y.o.   MRN: [id number]    DATE: 04/15/16  Location:  [facility]    Place of Service: SNF (31)   Extended Emergency Contact Information Primary Emergency Contact: [place] Phone: [phone] Relation: Daughter Secondary Emergency Contact: [place] Mobile Phone: [phone] Relation: Grandson  Advanced Directive information  FULL CODE  Chief Complaint  Patient presents with  . Acute Visit    eye d/c; ear rash    HPI:  82 yo male long term resident seen today for eye d/c and rash on ear per nursing. He has no concerns. He is very HOH. No f/c. No itching.  He is a poor historian due to dementia. Hx obtained from chart  FTT/severe protein cal malnutrition - takes nutritional supplements per facility protocol. He also takes vitamins and minerals. Appetite remains poor despite taking remeron x few weeks. Weight down 28 lbs since May 2016. POA has been resistant to change Code status  Mood d/o/dementia - mood stable on effexor xr. Dementia  - he does not take any meds   Past Medical History  Diagnosis Date  . Asthma   . Hypertension   . A-fib ([place])   . Hyperlipidemia   . Shortness of breath   . CHF (congestive heart failure) ([place]) 10/28/2011  . Forgetfulness   . PVC's (premature ventricular contractions)   . Mild aortic insufficiency   . Dementia     [name] 02/05/2015  . Failure to thrive in adult     /notes 02/05/2015  . COPD (chronic obstructive pulmonary disease) ([place])   . Hypothyroidism     [name] 02/05/2015  . Fall 02/04/2015    with altered mental status off of his baseline after he suffered a mechanical fall /notes 02/04/2015  . CAP (community acquired pneumonia)     [name] 02/05/2015  . Colon cancer [facility])     Past Surgical History  Procedure Laterality Date  . Colon surgery      Partial hemecolectomy   . Central line  insertion  09/08/2011         Patient Care Team: Susy Frizzle, MD as PCP - General (Family Medicine)  Social History   Social History  . Marital Status: Widowed    Spouse Name: N/A  . Number of Children: 2  . Years of Education: 12   Occupational History  . 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 Smokeless tobacco: Not on file     Comment: quit 30+ yrs ago  . Alcohol Use: No  . Drug Use: No  . Sexual Activity: No   Other Topics Concern  . Not on file   Social History Narrative   [place] man. 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 Widowed 02/18/14. Had a son who died at 37. Has a daughter, 2 grandchildren, 2 great-grandchildren. Reports that he was living with his daughter     reports that he has quit smoking. He does not have any smokeless tobacco history on file. He reports that he does not drink alcohol or use illicit drugs.  Family History  Problem Relation Age of Onset  . Hyperlipidemia Father   . Hypertension Father   . Heart disease Father   .  Asthma Father   . Stroke Father   . Heart failure Father   . Heart disease Mother   . Heart failure Mother   . Heart attack Sister    Family Status  Relation Status Death Age  . Father Deceased 54    heart failure  . Mother Deceased 85    heart failure  . Sister Deceased 13    MI    Immunization History  Administered Date(s) Administered  . Influenza Whole 07/02/2009, 09/10/2011  . Influenza,inj,Quad PF,36+ Mos 08/28/2013, 09/05/2014  . Influenza-Unspecified 08/12/2015  . PPD Test 12/08/2015  . Pneumococcal Conjugate-13 09/05/2014  . Pneumococcal Polysaccharide-23 11/01/2006  . Zoster 08/26/2006    No Known Allergies  Medications: Patient's Medications  New Prescriptions   No medications on file  Previous Medications   ACETAMINOPHEN (TYLENOL) 325 MG TABLET     Take 650 mg by mouth every 6 (six) hours as needed for mild pain or fever.    ADVAIR DISKUS 500-50 MCG/DOSE AEPB    INHALE 1 PUFF TWICE A DAY   AMBULATORY NON FORMULARY MEDICATION    Magic cup three times daily.   ARTIFICIAL TEAR SOLUTION OP    Apply to eye. Instill 2 drops into both eyes four times daily as needed   FERROUS SULFATE 325 (65 FE) MG TABLET    TAKE 1 TABLET BY MOUTH EVERY DAY   FLUTICASONE (FLONASE) 50 MCG/ACT NASAL SPRAY    Place 2 sprays into both nostrils daily.   GUAIFENESIN 200 MG TABLET    Take 200 mg by mouth every 4 (four) hours as needed for cough or to loosen phlegm.   IPRATROPIUM-ALBUTEROL (DUONEB) 0.5-2.5 (3) MG/3ML SOLN    Take 3 mLs by nebulization every 6 (six) hours as needed (SOB/Wheezing).    LEVALBUTEROL (XOPENEX) 0.63 MG/3ML NEBULIZER SOLUTION    Take 3 mLs (0.63 mg total) by nebulization every 4 (four) hours as needed for wheezing or shortness of breath.   LEVOTHYROXINE (SYNTHROID, LEVOTHROID) 25 MCG TABLET    TAKE 1 TABLET BY MOUTH EVERY DAY   LOSARTAN (COZAAR) 50 MG TABLET    Take 50 mg by mouth daily.   MIRTAZAPINE (REMERON) 15 MG TABLET    Take 15 mg by mouth at bedtime.   OMEPRAZOLE (PRILOSEC) 40 MG CAPSULE    Take 40 mg by mouth daily.   POTASSIUM CHLORIDE SA (K-DUR,KLOR-CON) 20 MEQ TABLET    Take 40 mEq by mouth daily.   PRAVASTATIN (PRAVACHOL) 20 MG TABLET    Take 20 mg by mouth daily.   RIVAROXABAN (XARELTO) 20 MG TABS TABLET    Take 1 tablet (20 mg total) by mouth daily with supper.   TAMSULOSIN (FLOMAX) 0.4 MG CAPS CAPSULE    Take 1 capsule (0.4 mg total) by mouth daily.   VENLAFAXINE XR (EFFEXOR-XR) 37.5 MG 24 HR CAPSULE    Take 75 mg by mouth daily with breakfast.  Modified Medications   No medications on file  Discontinued Medications   No medications on file    Review of Systems  Unable to perform ROS: Dementia    Filed Vitals:   04/15/16 1340  BP: 146/54  Pulse: 70  Temp: 97.2 F (36.2 C)   There is no weight on file to calculate  BMI.  Physical Exam  Constitutional: He appears well-developed.  Frail appearing lying in bed in NAD  HENT:  Head:    Right Ear: No lacerations. There is swelling and tenderness. No drainage. No mastoid tenderness.  Decreased hearing is noted.  Left Ear: Decreased hearing is noted.  Eyes: Pupils are equal, round, and reactive to light. Right eye exhibits no chemosis, no discharge, no exudate and no hordeolum. No foreign body present in the right eye. Left eye exhibits discharge (green). Left eye exhibits no chemosis, no exudate and no hordeolum. No foreign body present in the left eye. Left conjunctiva is injected. Left conjunctiva has no hemorrhage. No scleral icterus.    Musculoskeletal: He exhibits edema.  Neurological: He is alert.  Skin: Skin is warm and dry. Rash noted.  Psychiatric: He has a normal mood and affect. His behavior is normal.     Labs reviewed: Nursing Home on 02/23/2016  Component Date Value Ref Range Status  . Hemoglobin 02/21/2016 9.0* 13.5 - 17.5 g/dL Final  . HCT 02/21/2016 28* 41 - 53 % Final  . Platelets 02/21/2016 331  150 - 399 K/L Final  . WBC 02/21/2016 5.7   Final  . Glucose 02/21/2016 82   Final  . BUN 02/21/2016 15  4 - 21 mg/dL Final  . Creatinine 02/21/2016 1.0  0.6 - 1.3 mg/dL Final  . Potassium 02/21/2016 4.4  3.4 - 5.3 mmol/L Final  . Sodium 02/21/2016 142  137 - 147 mmol/L Final  . TSH 01/30/2016 5.10  0.41 - 5.90 uIU/mL Final  Nursing Home on 01/28/2016  Component Date Value Ref Range Status  . Hemoglobin 01/14/2016 9.3* 13.5 - 17.5 g/dL Final  . HCT 01/14/2016 28* 41 - 53 % Final  . Platelets 01/14/2016 420* 150 - 399 K/L Final  . WBC 01/14/2016 7.7   Final  . Glucose 01/14/2016 85   Final  . BUN 01/14/2016 12  4 - 21 mg/dL Final  . Creatinine 01/14/2016 0.8  0.6 - 1.3 mg/dL Final  . Potassium 01/14/2016 4.1  3.4 - 5.3 mmol/L Final  . Sodium 01/14/2016 138  137 - 147 mmol/L Final  . Hemoglobin 12/26/2015 9.1* 13.5 - 17.5 g/dL  Final  . HCT 12/26/2015 27* 41 - 53 % Final  . Platelets 12/26/2015 448* 150 - 399 K/L Final  . WBC 12/26/2015 5.1   Final    No results found.   Assessment/Plan   ICD-9-CM ICD-10-CM   1. Conjunctivitis of left eye 372.30 H10.9   2. Rash and nonspecific skin eruption 782.1 R21    right ear  3. Dementia, without behavioral disturbance 294.20 F03.90     Rx cipro eye gtts 0.3% instill 1 gtt into OS q2hrs WA x 2 days then q4hr WA x 5 days and stop  rx dermatop 0.1% crm BID x 2 weeks to right ear  Cont other meds as ordered  Will follow  Raney Antwine S. Perlie Gold  [facility] and Adult Medicine [address] [place], [place] [address] [phone] Cell (Monday-Friday 8 AM - 5 PM) [phone] After 5 PM and follow prompts

## 2016-04-22 ENCOUNTER — Encounter: Payer: Self-pay | Admitting: Internal Medicine

## 2016-04-22 NOTE — Progress Notes (Signed)
This encounter was created in error - please disregard.

## 2016-04-23 ENCOUNTER — Non-Acute Institutional Stay (SKILLED_NURSING_FACILITY): Payer: Medicare Other | Admitting: Nurse Practitioner

## 2016-04-23 DIAGNOSIS — I5032 Chronic diastolic (congestive) heart failure: Secondary | ICD-10-CM

## 2016-04-23 DIAGNOSIS — E034 Atrophy of thyroid (acquired): Secondary | ICD-10-CM

## 2016-04-23 DIAGNOSIS — I482 Chronic atrial fibrillation, unspecified: Secondary | ICD-10-CM

## 2016-04-23 DIAGNOSIS — F0390 Unspecified dementia without behavioral disturbance: Secondary | ICD-10-CM | POA: Diagnosis not present

## 2016-04-23 DIAGNOSIS — E785 Hyperlipidemia, unspecified: Secondary | ICD-10-CM

## 2016-04-23 DIAGNOSIS — J449 Chronic obstructive pulmonary disease, unspecified: Secondary | ICD-10-CM

## 2016-04-23 DIAGNOSIS — E038 Other specified hypothyroidism: Secondary | ICD-10-CM

## 2016-04-23 NOTE — Progress Notes (Signed)
Patient ID: Todd [name], male   DOB: 27-Aug-1924, 82 y.o.   MRN: [id number]    Nursing Home Location:  [place] of Service: SNF (31)  PCP: Gildardo Cranker, DO  No Known Allergies  Chief Complaint  Patient presents with  . Medical Management of Chronic Issues    Routine Visit    HPI:  Patient is a 82 y.o. male seen today at [facility] for routine follow up. Pt with a pmh of COPD, HTN, a fib, CHF, hypothyroidism, hyperlipemia, dementia and FTT. Pt was seen in the last month due to rash and conjunctivitis which has improved with treatment. Staff has no current concerns at this time. Pt appears to be in good spirits today as he is very talkative but a poor historian due to his dementia.   Review of Systems:  Review of Systems  Unable to perform ROS: Dementia    Past Medical History  Diagnosis Date  . Asthma   . Hypertension   . A-fib ([place])   . Hyperlipidemia   . Shortness of breath   . CHF (congestive heart failure) ([place]) 10/28/2011  . Forgetfulness   . PVC's (premature ventricular contractions)   . Mild aortic insufficiency   . Dementia     [name] 02/05/2015  . Failure to thrive in adult     /notes 02/05/2015  . COPD (chronic obstructive pulmonary disease) ([place])   . Hypothyroidism     [name] 02/05/2015  . Fall 02/04/2015    with altered mental status off of his baseline after he suffered a mechanical fall /notes 02/04/2015  . CAP (community acquired pneumonia)     [name] 02/05/2015  . Colon cancer [facility])    Past Surgical History  Procedure Laterality Date  . Colon surgery      Partial hemecolectomy   . Central line insertion  09/08/2011        Social History:   reports that he has quit smoking. He does not have any smokeless tobacco history on file. He reports that he does not drink alcohol or use illicit drugs.  Family History  Problem Relation Age of Onset  . Hyperlipidemia Father   . Hypertension Father   . Heart disease Father   . Asthma  Father   . Stroke Father   . Heart failure Father   . Heart disease Mother   . Heart failure Mother   . Heart attack Sister     Medications: Patient's Medications  New Prescriptions   No medications on file  Previous Medications   ACETAMINOPHEN (TYLENOL) 325 MG TABLET    Take 650 mg by mouth every 6 (six) hours as needed for mild pain or fever.    ADVAIR DISKUS 500-50 MCG/DOSE AEPB    INHALE 1 PUFF TWICE A DAY   AMBULATORY NON FORMULARY MEDICATION    Magic cup three times daily.   ARTIFICIAL TEAR SOLUTION OP    Apply to eye. Instill 2 drops into both eyes four times daily as needed   CIPROFLOXACIN (CILOXAN) 0.3 % OPHTHALMIC SOLUTION    Administer 1 drop, every 2 hours, while awake, for 2 days. Then 1 drop, every 4 hours, while awake, for the next 5 days.   FERROUS SULFATE 325 (65 FE) MG TABLET    TAKE 1 TABLET BY MOUTH EVERY DAY   FLUTICASONE (FLONASE) 50 MCG/ACT NASAL SPRAY    Place 2 sprays into both nostrils daily.   GUAIFENESIN 200 MG TABLET  Take 200 mg by mouth every 4 (four) hours as needed for cough or to loosen phlegm.   IPRATROPIUM-ALBUTEROL (DUONEB) 0.5-2.5 (3) MG/3ML SOLN    Take 3 mLs by nebulization every 6 (six) hours as needed (SOB/Wheezing).    LEVALBUTEROL (XOPENEX) 0.63 MG/3ML NEBULIZER SOLUTION    Take 3 mLs (0.63 mg total) by nebulization every 4 (four) hours as needed for wheezing or shortness of breath.   LEVOTHYROXINE (SYNTHROID, LEVOTHROID) 25 MCG TABLET    TAKE 1 TABLET BY MOUTH EVERY DAY   LOSARTAN (COZAAR) 50 MG TABLET    Take 50 mg by mouth daily.   MIRTAZAPINE (REMERON) 15 MG TABLET    Take 15 mg by mouth at bedtime.   OMEPRAZOLE (PRILOSEC) 40 MG CAPSULE    Take 40 mg by mouth daily.   POTASSIUM CHLORIDE SA (K-DUR,KLOR-CON) 20 MEQ TABLET    Take 40 mEq by mouth daily.   PRAVASTATIN (PRAVACHOL) 20 MG TABLET    Take 20 mg by mouth daily.   PREDNICARBATE (DERMATOP) 0.1 % CREA    Apply to rash on right ear twice daily for 2 weeks. STOP DATE: 04/29/16    RIVAROXABAN (XARELTO) 20 MG TABS TABLET    Take 1 tablet (20 mg total) by mouth daily with supper.   TAMSULOSIN (FLOMAX) 0.4 MG CAPS CAPSULE    Take 1 capsule (0.4 mg total) by mouth daily.   VENLAFAXINE XR (EFFEXOR-XR) 37.5 MG 24 HR CAPSULE    Take 75 mg by mouth daily with breakfast.  Modified Medications   No medications on file  Discontinued Medications   No medications on file     Physical Exam: Filed Vitals:   04/23/16 1124  BP: 146/54  Pulse: 88  Temp: 98.7 F (37.1 C)  TempSrc: Oral  Resp: 18  Height: 5\' 6"  (1.676 m)  Weight: 124 lb 6.4 oz (56.427 kg)    Physical Exam  Constitutional: He appears well-developed. No distress.  Frail male in NAD  HENT:  Mouth/Throat: Oropharynx is clear and moist. No oropharyngeal exudate.  HOH  Eyes: Conjunctivae and EOM are normal. Pupils are equal, round, and reactive to light. No scleral icterus.  Neck: Neck supple. Carotid bruit is not present. No thyromegaly present.  Cardiovascular: Normal rate, regular rhythm and normal heart sounds.  Exam reveals no gallop and no friction rub.   Pulmonary/Chest: Effort normal. No accessory muscle usage. No respiratory distress. He has decreased breath sounds (b/l at base). He has no wheezes. He has no rhonchi.  Chronic O2, diminished breath sounds throughout  Abdominal: Soft. Bowel sounds are normal. He exhibits no distension, no abdominal bruit and no pulsatile midline mass. There is no tenderness.  Musculoskeletal: He exhibits edema. He exhibits no tenderness.  Small and large joint deformities  Lymphadenopathy:    He has no cervical adenopathy.  Neurological: He is alert.  Skin: Skin is warm and dry. No rash noted. He is not diaphoretic.  Psychiatric: He has a normal mood and affect. His behavior is normal.    Labs reviewed: Basic Metabolic Panel:  Recent Labs  12/01/15 0334 12/02/15 0808 12/03/15 0707 12/04/15 0537 01/14/16 02/21/16  NA 144 144 141 142 138 142  K 3.4* 3.4* 3.2*  3.5 4.1 4.4  CL 108 106 103 104  --   --   CO2 28 28 32 30  --   --   GLUCOSE 125* 86 95 96  --   --   BUN 23* 20 21* 25* 12  15  CREATININE 0.91 0.83 0.82 0.88 0.8 1.0  CALCIUM 9.0 9.2 9.1 8.9  --   --   MG 1.8  --   --   --   --   --    Liver Function Tests:  Recent Labs  10/31/15 1350 11/27/15 1212  AST 15 20  ALT 11* 13*  ALKPHOS 72 94  BILITOT 0.5 0.1*  PROT 5.1* 7.3  ALBUMIN 2.5* 3.5   No results for input(s): LIPASE, AMYLASE in the last 8760 hours. No results for input(s): AMMONIA in the last 8760 hours. CBC:  Recent Labs  10/31/15 1350  11/27/15 1212  12/02/15 0808 12/03/15 0707 12/04/15 0537 12/26/15 01/14/16 02/21/16  WBC 6.4  < > 11.1*  < > 7.6 8.6 9.8 5.1 7.7 5.7  NEUTROABS 3.9  --  7.4  --   --   --   --   --   --   --   HGB 8.8*  < > 12.1*  < > 10.6* 11.1* 11.0* 9.1* 9.3* 9.0*  HCT 26.3*  < > 37.6*  < > 32.0* 32.7* 33.3* 27* 28* 28*  MCV 91.6  < > 95.2  < > 89.4 90.1 90.7  --   --   --   PLT 213  < > 382  < > 330 307 302 448* 420* 331  < > = values in this interval not displayed. TSH:  Recent Labs  09/15/15 01/30/16 04/03/16  TSH 5.00 5.10 6.10*   A1C: Lab Results  Component Value Date   HGBA1C 6.1* 09/06/2014   Lipid Panel:  Recent Labs  03/23/16  CHOL 121  HDL 38  LDLCALC 58  TRIG 123    Radiological Exams: 10/21/15 -interval development of mild right perihilar infiltrates   Assessment/Plan 1. Hypothyroidism due to acquired atrophy of thyroid TSH slightly elevated, on synthroid 25 mcg, cont to monitor for now.   2. Chronic obstructive pulmonary disease, unspecified COPD type ([place]) COPD remains stable on advair, mucinex, nebs and O2  3. Chronic diastolic CHF (congestive heart failure) (HCC) Stable without recent exacerbation.   4. Chronic atrial fibrillation (HCC) -rate controlled, conts on xarelto   5. Dementia Advanced disease, pt with weight loss which is expected with disease progression. Ongoing support provided by  staff   6. Hyperlipidemia  -LDL at 58 and total cholesterol low at 121. Will DC Pravachol and monitor at this time   Carlos American. Harle Battiest  [facility] & [facility] Medicine [phone] 8 am - 5 pm) [phone] (after hours)

## 2016-04-25 DIAGNOSIS — J9621 Acute and chronic respiratory failure with hypoxia: Secondary | ICD-10-CM | POA: Insufficient documentation

## 2016-05-04 LAB — BASIC METABOLIC PANEL
BUN: 27 mg/dL — AB (ref 4–21)
CREATININE: 0.9 mg/dL (ref 0.6–1.3)
Glucose: 84 mg/dL
POTASSIUM: 4.7 mmol/L (ref 3.4–5.3)
Sodium: 141 mmol/L (ref 137–147)

## 2016-05-04 LAB — HEPATIC FUNCTION PANEL
ALK PHOS: 81 U/L (ref 25–125)
ALT: 8 U/L — AB (ref 10–40)
AST: 12 U/L — AB (ref 14–40)
Bilirubin, Total: 0.2 mg/dL

## 2016-05-04 LAB — CBC AND DIFFERENTIAL
HCT: 30 % — AB (ref 41–53)
Hemoglobin: 9.8 g/dL — AB (ref 13.5–17.5)
PLATELETS: 267 10*3/uL (ref 150–399)
WBC: 5.5 10^3/mL

## 2016-05-09 LAB — BASIC METABOLIC PANEL
BUN: 26 mg/dL — AB (ref 4–21)
CREATININE: 0.9 mg/dL (ref 0.6–1.3)
GLUCOSE: 88 mg/dL
POTASSIUM: 4.5 mmol/L (ref 3.4–5.3)
SODIUM: 139 mmol/L (ref 137–147)

## 2016-05-09 LAB — CBC AND DIFFERENTIAL
HCT: 29 % — AB (ref 41–53)
Hemoglobin: 9.2 g/dL — AB (ref 13.5–17.5)
Platelets: 288 10*3/uL (ref 150–399)
WBC: 6 10*3/mL

## 2016-05-09 LAB — HEPATIC FUNCTION PANEL
ALT: 7 U/L — AB (ref 10–40)
AST: 12 U/L — AB (ref 14–40)
Alkaline Phosphatase: 79 U/L (ref 25–125)
Bilirubin, Total: 0.2 mg/dL

## 2016-05-26 ENCOUNTER — Non-Acute Institutional Stay (SKILLED_NURSING_FACILITY): Payer: Medicare Other | Admitting: Nurse Practitioner

## 2016-05-26 DIAGNOSIS — L8990 Pressure ulcer of unspecified site, unspecified stage: Secondary | ICD-10-CM

## 2016-05-26 DIAGNOSIS — J449 Chronic obstructive pulmonary disease, unspecified: Secondary | ICD-10-CM | POA: Diagnosis not present

## 2016-05-26 DIAGNOSIS — R946 Abnormal results of thyroid function studies: Secondary | ICD-10-CM | POA: Diagnosis not present

## 2016-05-26 DIAGNOSIS — R7989 Other specified abnormal findings of blood chemistry: Secondary | ICD-10-CM

## 2016-05-26 DIAGNOSIS — I482 Chronic atrial fibrillation, unspecified: Secondary | ICD-10-CM

## 2016-05-26 DIAGNOSIS — N390 Urinary tract infection, site not specified: Secondary | ICD-10-CM | POA: Diagnosis not present

## 2016-05-26 DIAGNOSIS — F0390 Unspecified dementia without behavioral disturbance: Secondary | ICD-10-CM

## 2016-05-26 NOTE — Progress Notes (Signed)
Patient ID: Todd [name], male   DOB: 1924-04-03, 82 y.o.   MRN: [id number]    Nursing Home Location:  [place] of Service: SNF (31)  PCP: Todd Cranker, DO  No Known Allergies  Chief Complaint  Patient presents with  . Medical Management of Chronic Issues    Routine Visit    HPI:  Patient is a 82 y.o. male seen today at [facility] for routine follow up. Pt with a pmh of COPD, HTN, a fib, CHF, hypothyroidism, hyperlipemia, dementia and FTT.  In the last month pt has had increase in synthroid due to elevated TSH, follow up TSH scheduled. Increase in confusion and urine was collected for UA C&S which came back positive for UTI. Pt currently on bactrim for 7 days.   Review of Systems:  Review of Systems  Unable to perform ROS: Dementia    Past Medical History:  Diagnosis Date  . A-fib ([place])   . Asthma   . CAP (community acquired pneumonia)    Todd [name] 02/05/2015  . CHF (congestive heart failure) ([place]) 10/28/2011  . Colon cancer ([place])   . COPD (chronic obstructive pulmonary disease) ([place])   . Dementia    Todd [name] 02/05/2015  . Failure to thrive in adult    /notes 02/05/2015  . Fall 02/04/2015   with altered mental status off of his baseline after he suffered a mechanical fall /notes 02/04/2015  . Forgetfulness   . Hyperlipidemia   . Hypertension   . Hypothyroidism    Todd [name] 02/05/2015  . Mild aortic insufficiency   . PVC's (premature ventricular contractions)   . Shortness of breath    Past Surgical History:  Procedure Laterality Date  . CENTRAL LINE INSERTION  09/08/2011      . COLON SURGERY     Partial hemecolectomy    Social History:   reports that he has quit smoking. He has never used smokeless tobacco. He reports that he does not drink alcohol or use drugs.  Family History  Problem Relation Age of Onset  . Hyperlipidemia Father   . Hypertension Father   . Heart disease Father   . Asthma Father   . Stroke Father   . Heart failure Father     . Heart disease Mother   . Heart failure Mother   . Heart attack Sister     Medications: Patient's Medications  New Prescriptions   No medications on file  Previous Medications   ACETAMINOPHEN (TYLENOL) 325 MG TABLET    Take 650 mg by mouth every 6 (six) hours as needed for mild pain or fever.    ADVAIR DISKUS 500-50 MCG/DOSE AEPB    INHALE 1 PUFF TWICE A DAY   AMBULATORY NON FORMULARY MEDICATION    Magic cup three times daily.   ARTIFICIAL TEAR SOLUTION OP    Apply to eye. Instill 2 drops into both eyes four times daily as needed   FERROUS SULFATE 325 (65 FE) MG TABLET    TAKE 1 TABLET BY MOUTH EVERY DAY   FLUTICASONE (FLONASE) 50 MCG/ACT NASAL SPRAY    Place 2 sprays into both nostrils daily.   GUAIFENESIN 200 MG TABLET    Take 200 mg by mouth every 4 (four) hours as needed for cough or to loosen phlegm.   IPRATROPIUM-ALBUTEROL (DUONEB) 0.5-2.5 (3) MG/3ML SOLN    Take 3 mLs by nebulization every 6 (six) hours as needed (SOB/Wheezing).    LEVALBUTEROL (XOPENEX) 0.63 MG/3ML NEBULIZER SOLUTION  Take 3 mLs (0.63 mg total) by nebulization every 4 (four) hours as needed for wheezing or shortness of breath.   LEVOTHYROXINE (SYNTHROID, LEVOTHROID) 50 MCG TABLET    Take 50 mcg by mouth daily before breakfast.   LOSARTAN (COZAAR) 50 MG TABLET    Take 50 mg by mouth daily.   MIRTAZAPINE (REMERON) 15 MG TABLET    Take 15 mg by mouth at bedtime.   OMEPRAZOLE (PRILOSEC) 40 MG CAPSULE    Take 40 mg by mouth daily.   POTASSIUM CHLORIDE SA (K-DUR,KLOR-CON) 20 MEQ TABLET    Take 40 mEq by mouth daily.   RIVAROXABAN (XARELTO) 20 MG TABS TABLET    Take 1 tablet (20 mg total) by mouth daily with supper.   TAMSULOSIN (FLOMAX) 0.4 MG CAPS CAPSULE    Take 1 capsule (0.4 mg total) by mouth daily.   VENLAFAXINE XR (EFFEXOR-XR) 37.5 MG 24 HR CAPSULE    Take 75 mg by mouth daily with breakfast.  Modified Medications   No medications on file  Discontinued Medications   CIPROFLOXACIN (CILOXAN) 0.3 % OPHTHALMIC  SOLUTION    Administer 1 drop, every 2 hours, while awake, for 2 days. Then 1 drop, every 4 hours, while awake, for the next 5 days.   LEVOTHYROXINE (SYNTHROID, LEVOTHROID) 25 MCG TABLET    TAKE 1 TABLET BY MOUTH EVERY DAY   POTASSIUM CHLORIDE SA (K-DUR,KLOR-CON) 20 MEQ TABLET    Take 40 mEq by mouth daily.   PRAVASTATIN (PRAVACHOL) 20 MG TABLET    Take 20 mg by mouth daily.   PREDNICARBATE (DERMATOP) 0.1 % CREA    Apply to rash on right ear twice daily for 2 weeks. STOP DATE: 04/29/16     Physical Exam: Vitals:   05/26/16 1025  BP: (!) 115/50  Pulse: 92  Resp: 18  Temp: 97.1 F (36.2 C)  TempSrc: Oral  SpO2: 90%  Weight: 124 lb (56.2 kg)  Height: 5\' 6"  (1.676 m)    Physical Exam  Constitutional: He appears well-developed. No distress.  Frail male in NAD  HENT:  Mouth/Throat: Oropharynx is clear and moist. No oropharyngeal exudate.  HOH  Eyes: Conjunctivae and EOM are normal. Pupils are equal, round, and reactive to light. No scleral icterus.  Neck: Neck supple. Carotid bruit is not present. No thyromegaly present.  Cardiovascular: Normal rate, regular rhythm and normal heart sounds.  Exam reveals no gallop and no friction rub.   Pulmonary/Chest: Effort normal. No accessory muscle usage. No respiratory distress. He has decreased breath sounds (b/l at base). He has no wheezes. He has no rhonchi.  Chronic O2, diminished breath sounds throughout  Abdominal: Soft. Bowel sounds are normal. He exhibits no distension, no abdominal bruit and no pulsatile midline mass. There is no tenderness.  Musculoskeletal: He exhibits no edema or tenderness.  Small and large joint deformities  Lymphadenopathy:    He has no cervical adenopathy.  Neurological: He is alert.  Oriented to self only  Skin: Skin is warm and dry. No rash noted. He is not diaphoretic.  Right ear lobe with redness, tenderness and edema noted  Psychiatric: He has a normal mood and affect. His behavior is normal.    Labs  reviewed: Basic Metabolic Panel:  Recent Labs  12/01/15 0334 12/02/15 0808 12/03/15 0707 12/04/15 0537 01/14/16 02/21/16 05/04/16  NA 144 144 141 142 138 142 141  K 3.4* 3.4* 3.2* 3.5 4.1 4.4 4.7  CL 108 106 103 104  --   --   --  CO2 28 28 32 30  --   --   --   GLUCOSE 125* 86 95 96  --   --   --   BUN 23* 20 21* 25* 12 15 27*  CREATININE 0.91 0.83 0.82 0.88 0.8 1.0 0.9  CALCIUM 9.0 9.2 9.1 8.9  --   --   --   MG 1.8  --   --   --   --   --   --    Liver Function Tests:  Recent Labs  10/31/15 1350 11/27/15 1212 05/04/16  AST 15 20 12*  ALT 11* 13* 8*  ALKPHOS 72 94 81  BILITOT 0.5 0.1*  --   PROT 5.1* 7.3  --   ALBUMIN 2.5* 3.5  --    No results for input(s): LIPASE, AMYLASE in the last 8760 hours. No results for input(s): AMMONIA in the last 8760 hours. CBC:  Recent Labs  10/31/15 1350  11/27/15 1212  12/02/15 0808 12/03/15 0707 12/04/15 0537  01/14/16 02/21/16 05/04/16  WBC 6.4  < > 11.1*  < > 7.6 8.6 9.8  < > 7.7 5.7 5.5  NEUTROABS 3.9  --  7.4  --   --   --   --   --   --   --   --   HGB 8.8*  < > 12.1*  < > 10.6* 11.1* 11.0*  < > 9.3* 9.0* 9.8*  HCT 26.3*  < > 37.6*  < > 32.0* 32.7* 33.3*  < > 28* 28* 30*  MCV 91.6  < > 95.2  < > 89.4 90.1 90.7  --   --   --   --   PLT 213  < > 382  < > 330 307 302  < > 420* 331 267  < > = values in this interval not displayed. TSH:  Recent Labs  09/15/15 01/30/16 04/03/16  TSH 5.00 5.10 6.10*   A1C: Lab Results  Component Value Date   HGBA1C 6.1 (H) 09/06/2014   Lipid Panel:  Recent Labs  03/23/16  CHOL 121  HDL 38  LDLCALC 58  TRIG 123    Radiological Exams: 10/21/15 -interval development of mild right perihilar infiltrates   Assessment/Plan 1. Chronic atrial fibrillation (HCC) Rate controlled, conts on xarelto for anticoagulation   2. Chronic obstructive pulmonary disease, unspecified COPD type ([place]) COPD remains stable, conts on long term O2, advair and duoneb as needed  3. Dementia, without  behavioral disturbance Increase confusion due to UTI, staff providing supportive care  4. Pressure ulcer To right ear lobe as he has preference to that side when laying in bed. Now red, swollen and tender, pt currently on Bactrim DS for UTI -will add Bactroban ointment TID at this time   5. UTI (lower urinary tract infection) Cont bactrim and staff to encourage hydration   6. Elevated TSH -TSH elevated at 6.1 on 04/03/16, synthroid was increased to 50 mcg and TSH scheduled  Davis Ambrosini K. Harle Battiest  [facility] & Adult Medicine [phone] 8 am - 5 pm) [phone] (after hours)

## 2016-06-16 ENCOUNTER — Non-Acute Institutional Stay (SKILLED_NURSING_FACILITY): Payer: Medicare Other | Admitting: Nurse Practitioner

## 2016-06-16 DIAGNOSIS — F0390 Unspecified dementia without behavioral disturbance: Secondary | ICD-10-CM | POA: Diagnosis not present

## 2016-06-16 DIAGNOSIS — E038 Other specified hypothyroidism: Secondary | ICD-10-CM | POA: Diagnosis not present

## 2016-06-16 DIAGNOSIS — I482 Chronic atrial fibrillation, unspecified: Secondary | ICD-10-CM

## 2016-06-16 DIAGNOSIS — E034 Atrophy of thyroid (acquired): Secondary | ICD-10-CM

## 2016-06-16 DIAGNOSIS — J449 Chronic obstructive pulmonary disease, unspecified: Secondary | ICD-10-CM | POA: Diagnosis not present

## 2016-06-16 DIAGNOSIS — I5032 Chronic diastolic (congestive) heart failure: Secondary | ICD-10-CM

## 2016-06-16 NOTE — Progress Notes (Signed)
Patient ID: Todd [name], male   DOB: 03/20/1924, 82 y.o.   MRN: [id number]    Nursing Home Location:  [facility]  Place of Service: SNF (31)  PCP: Unice Cobble, MD  No Known Allergies  Chief Complaint  Patient presents with  . Medical Management of Chronic Issues    Routine Visit    HPI:  Patient is a 82 y.o. male seen today at [facility] for routine follow up. Pt with a pmh of COPD, HTN, a fib, CHF, hypothyroidism, hyperlipemia, dementia and FTT.   Pt has been doing well in the last month. Completed treatment for UTI.  Right ear redness improved with Bactroban.  Pt with dementia and conts to have periods on increased confusion. Currently without any worsening behaviors.  No increase in shortness of breath  Review of Systems:  Review of Systems  Unable to perform ROS: Dementia    Past Medical History:  Diagnosis Date  . A-fib ([place])   . Asthma   . CAP (community acquired pneumonia)    [name] 02/05/2015  . CHF (congestive heart failure) ([place]) 10/28/2011  . Colon cancer ([place])   . COPD (chronic obstructive pulmonary disease) ([place])   . Dementia    [name] 02/05/2015  . Failure to thrive in adult    /notes 02/05/2015  . Fall 02/04/2015   with altered mental status off of his baseline after he suffered a mechanical fall /notes 02/04/2015  . Forgetfulness   . Hyperlipidemia   . Hypertension   . Hypothyroidism    [name] 02/05/2015  . Mild aortic insufficiency   . PVC's (premature ventricular contractions)   . Shortness of breath    Past Surgical History:  Procedure Laterality Date  . CENTRAL LINE INSERTION  09/08/2011      . COLON SURGERY     Partial hemecolectomy    Social History:   reports that he has quit smoking. He has never used smokeless tobacco. He reports that he does not drink alcohol or use drugs.  Family History  Problem Relation Age of Onset  . Hyperlipidemia Father   . Hypertension Father   . Heart disease Father   . Asthma Father   .  Stroke Father   . Heart failure Father   . Heart disease Mother   . Heart failure Mother   . Heart attack Sister     Medications: Patient's Medications  New Prescriptions   No medications on file  Previous Medications   ACETAMINOPHEN (TYLENOL) 325 MG TABLET    Take 650 mg by mouth every 6 (six) hours as needed for mild pain or fever.    ACETAMINOPHEN (TYLENOL) 325 MG TABLET    Take 650 mg by mouth every 4 (four) hours as needed for mild pain or moderate pain.   ADVAIR DISKUS 500-50 MCG/DOSE AEPB    INHALE 1 PUFF TWICE A DAY   AMBULATORY NON FORMULARY MEDICATION    Magic cup three times daily.   ARTIFICIAL TEAR SOLUTION OP    Apply to eye. Instill 2 drops into both eyes four times daily as needed   FERROUS SULFATE 325 (65 FE) MG TABLET    TAKE 1 TABLET BY MOUTH EVERY DAY   FLUTICASONE (FLONASE) 50 MCG/ACT NASAL SPRAY    Place 2 sprays into both nostrils daily.   GUAIFENESIN 200 MG TABLET    Take 200 mg by mouth every 4 (four) hours as needed for cough or to loosen phlegm.   IPRATROPIUM-ALBUTEROL (DUONEB)  0.5-2.5 (3) MG/3ML SOLN    Take 3 mLs by nebulization every 6 (six) hours as needed (SOB/Wheezing).    LEVALBUTEROL (XOPENEX) 0.63 MG/3ML NEBULIZER SOLUTION    Take 3 mLs (0.63 mg total) by nebulization every 4 (four) hours as needed for wheezing or shortness of breath.   LEVOTHYROXINE (SYNTHROID, LEVOTHROID) 50 MCG TABLET    Take 50 mcg by mouth daily before breakfast.   LOSARTAN (COZAAR) 50 MG TABLET    Take 50 mg by mouth daily.   MIRTAZAPINE (REMERON) 15 MG TABLET    Take 15 mg by mouth at bedtime.   OMEPRAZOLE (PRILOSEC) 40 MG CAPSULE    Take 40 mg by mouth daily.   POTASSIUM CHLORIDE SA (K-DUR,KLOR-CON) 20 MEQ TABLET    Take 40 mEq by mouth daily.   RIVAROXABAN (XARELTO) 20 MG TABS TABLET    Take 1 tablet (20 mg total) by mouth daily with supper.   TAMSULOSIN (FLOMAX) 0.4 MG CAPS CAPSULE    Take 1 capsule (0.4 mg total) by mouth daily.   VENLAFAXINE XR (EFFEXOR-XR) 37.5 MG 24 HR  CAPSULE    Take 75 mg by mouth daily with breakfast.  Modified Medications   No medications on file  Discontinued Medications   No medications on file     Physical Exam: Vitals:   06/16/16 1154  BP: 113/81  Pulse: (!) 58  Resp: (!) 24  Temp: 97.8 F (36.6 C)  SpO2: 100%  Weight: 126 lb (57.2 kg)  Height: 5\' 6"  (1.676 m)    Physical Exam  Constitutional: He appears well-developed. No distress.  Frail male in NAD  HENT:  Mouth/Throat: Oropharynx is clear and moist. No oropharyngeal exudate.  HOH  Eyes: Conjunctivae and EOM are normal. Pupils are equal, round, and reactive to light. No scleral icterus.  Neck: Neck supple. Carotid bruit is not present. No thyromegaly present.  Cardiovascular: Normal rate, regular rhythm and normal heart sounds.  Exam reveals no gallop and no friction rub.   Pulmonary/Chest: Effort normal. No accessory muscle usage. No respiratory distress. He has decreased breath sounds. He has no wheezes. He has no rhonchi.  Chronic O2, diminished breath sounds throughout  Abdominal: Soft. Bowel sounds are normal. He exhibits no distension, no abdominal bruit and no pulsatile midline mass. There is no tenderness.  Musculoskeletal: He exhibits no edema or tenderness.  Lymphadenopathy:    He has no cervical adenopathy.  Neurological: He is alert.  Oriented to self only  Skin: Skin is warm and dry. No rash noted. He is not diaphoretic.  Psychiatric: He has a normal mood and affect. His behavior is normal.    Labs reviewed: Basic Metabolic Panel:  Recent Labs  12/01/15 0334 12/02/15 0808 12/03/15 0707 12/04/15 0537  02/21/16 05/04/16 05/09/16  NA 144 144 141 142  < > 142 141 139  K 3.4* 3.4* 3.2* 3.5  < > 4.4 4.7 4.5  CL 108 106 103 104  --   --   --   --   CO2 28 28 32 30  --   --   --   --   GLUCOSE 125* 86 95 96  --   --   --   --   BUN 23* 20 21* 25*  < > 15 27* 26*  CREATININE 0.91 0.83 0.82 0.88  < > 1.0 0.9 0.9  CALCIUM 9.0 9.2 9.1 8.9  --    --   --   --   MG 1.8  --   --   --   --   --   --   --   < > =  values in this interval not displayed. Liver Function Tests:  Recent Labs  10/31/15 1350 11/27/15 1212 05/04/16 05/09/16  AST 15 20 12* 12*  ALT 11* 13* 8* 7*  ALKPHOS 72 94 81 79  BILITOT 0.5 0.1*  --   --   PROT 5.1* 7.3  --   --   ALBUMIN 2.5* 3.5  --   --    No results for input(s): LIPASE, AMYLASE in the last 8760 hours. No results for input(s): AMMONIA in the last 8760 hours. CBC:  Recent Labs  10/31/15 1350  11/27/15 1212  12/02/15 0808 12/03/15 0707 12/04/15 0537  02/21/16 05/04/16 05/09/16  WBC 6.4  < > 11.1*  < > 7.6 8.6 9.8  < > 5.7 5.5 6.0  NEUTROABS 3.9  --  7.4  --   --   --   --   --   --   --   --   HGB 8.8*  < > 12.1*  < > 10.6* 11.1* 11.0*  < > 9.0* 9.8* 9.2*  HCT 26.3*  < > 37.6*  < > 32.0* 32.7* 33.3*  < > 28* 30* 29*  MCV 91.6  < > 95.2  < > 89.4 90.1 90.7  --   --   --   --   PLT 213  < > 382  < > 330 307 302  < > 331 267 288  < > = values in this interval not displayed. TSH:  Recent Labs  09/15/15 01/30/16 04/03/16  TSH 5.00 5.10 6.10*   A1C: Lab Results  Component Value Date   HGBA1C 6.1 (H) 09/06/2014   Lipid Panel:  Recent Labs  03/23/16  CHOL 121  HDL 38  LDLCALC 58  TRIG 123    Radiological Exams: 10/21/15 -interval development of mild right perihilar infiltrates   Assessment/Plan 1. Chronic obstructive pulmonary disease, unspecified COPD type ([place]) COPD is stable, cont on advair and xopenox PRN  2. Chronic diastolic CHF (congestive heart failure) (HCC) Stable.   3. Chronic atrial fibrillation (HCC) -rate controlled, conts on xarelto for anticoagulation  4. Dementia, without behavioral disturbance Stable, no acute changes in cognitive or functional status Remains on remeron for appetite, weight has remained stable.   5. Hypothyroidism due to acquired atrophy of thyroid -recent increase in synthroid, will follow TSH  Henrine Hayter K. Harle Battiest  [facility] [phone] 8 am - 5 pm) [phone] (after hours)

## 2016-07-13 ENCOUNTER — Encounter: Payer: Self-pay | Admitting: Internal Medicine

## 2016-07-13 ENCOUNTER — Other Ambulatory Visit: Payer: Self-pay | Admitting: Internal Medicine

## 2016-07-13 ENCOUNTER — Non-Acute Institutional Stay (SKILLED_NURSING_FACILITY): Payer: Medicare Other | Admitting: Internal Medicine

## 2016-07-13 DIAGNOSIS — I10 Essential (primary) hypertension: Secondary | ICD-10-CM | POA: Diagnosis not present

## 2016-07-13 DIAGNOSIS — E034 Atrophy of thyroid (acquired): Secondary | ICD-10-CM | POA: Diagnosis not present

## 2016-07-13 DIAGNOSIS — I482 Chronic atrial fibrillation, unspecified: Secondary | ICD-10-CM

## 2016-07-13 DIAGNOSIS — E038 Other specified hypothyroidism: Secondary | ICD-10-CM | POA: Diagnosis not present

## 2016-07-13 DIAGNOSIS — F0390 Unspecified dementia without behavioral disturbance: Secondary | ICD-10-CM | POA: Diagnosis not present

## 2016-07-13 NOTE — Assessment & Plan Note (Signed)
Follow-up TSH indicated as  last value was 6.10 on 50 g of L-thyroxine and he has chronic atrial fibrillation

## 2016-07-13 NOTE — Patient Instructions (Signed)
TSH 

## 2016-07-13 NOTE — Assessment & Plan Note (Addendum)
  Rate controlled, Continue Xarelto .Check CBC if any signs of active bleeding  Recheck TSH

## 2016-07-13 NOTE — Assessment & Plan Note (Signed)
Blood pressure controlled Renal function No change in therapy

## 2016-07-13 NOTE — Progress Notes (Signed)
   This is a nursing facility follow up of chronic medical diagnoses  Interim medical record and care since last [place] visit was updated with review of diagnostic studies and change in clinical status since last visit were documented.  HPI: This patient has medical diagnoses of hypothyroidism, hypertension, GERD, COPD, dementia and unspecified anemia.  Last labs on record were 05/09/16. He had mild prerenal azotemia with a BUN of 26. Anemia was relatively stable with hemoglobin 9.2  & hematocrit 29. He is on chronic iron supplementation therapy. On 04/03/16 TSH was mildly elevated at 6.10 on L-thyroxine 50 g. Follow-up was ordered 8/16, but apparently not completed. Serially the TSH has shown progressive elevation.  Review of systems was attempted in detail. He denied any active symptoms ,but dementia invalidated responses. He was unable to give me the date, name of the president, or present location. 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  Constitutional: No fever, fatigue  Eyes: No redness, discharge, pain, vision change Cardiovascular: No chest pain, palpitations,paroxysmal nocturnal dyspnea, claudication, edema  Respiratory: No cough, sputum production,hemoptysis, DOE , significant snoring,apnea   Gastrointestinal: No heartburn,dysphagia,abdominal pain, nausea / vomiting,rectal bleeding, melena,change in bowels Genitourinary: No dysuria,hematuria, pyuria,  incontinence, nocturia Musculoskeletal: No joint stiffness, joint swelling, weakness,pain Dermatologic: No rash, pruritus, change in appearance of skin Psychiatric: No significant anxiety , depression, insomnia, anorexia  Hematologic/lymphatic: No significant bruising, lymphadenopathy,abnormal bleeding   Physical exam:  Pertinent or positive findings: He is very frail and wasted with atrophy of the limbs and interosseous wasting of the hands. Oxygen is  in place. He has extremely poor dentition with caries to and below the gumline Heart sounds are somewhat distant and rhythm irregular. Breath sounds also decreased. He has mixed arthritic changes in the hands mainly of the PIP joints. Pedal pulses are variable in intensity.  General appearance: no acute distress , increased work of breathing is present.   Lymphatic: No lymphadenopathy about the head, neck, axilla . Eyes: No conjunctival inflammation or lid edema is present. There is no scleral icterus. Ears:  External ear exam shows no significant lesions or deformities.   Nose:  External nasal examination shows no deformity or inflammation. Nasal mucosa are pink and moist without lesions ,exudates Oral exam: lips and gums are healthy appearing. Neck:  No thyromegaly, masses, tenderness noted.    Heart:  No gallop, murmur, click, rub .  Lungs:Chest clear to auscultation without wheezes, rhonchi,rales , rubs. Abdomen:Bowel sounds are normal. Abdomen is soft and nontender with no organomegaly, hernias,masses. GU: deferred  Extremities:  No cyanosis, clubbing,edema  Neurologic exam : Strength markedly decreased  in upper & lower extremities Balance,Rhomberg,finger to nose testing could not be completed due to clinical state Skin: Warm & dry w/o tenting. No significant lesions or rash.    See summary under each active problem in the Problem List with associated updated therapeutic plan

## 2016-07-13 NOTE — Assessment & Plan Note (Signed)
Not candidate for Namenda or Aricept due to advanced age

## 2016-07-19 ENCOUNTER — Inpatient Hospital Stay (HOSPITAL_COMMUNITY)
Admission: EM | Admit: 2016-07-19 | Discharge: 2016-08-01 | DRG: 871 | Disposition: E | Payer: Medicare Other | Attending: Pulmonary Disease | Admitting: Pulmonary Disease

## 2016-07-19 ENCOUNTER — Encounter (HOSPITAL_COMMUNITY): Payer: Self-pay | Admitting: Emergency Medicine

## 2016-07-19 ENCOUNTER — Emergency Department (HOSPITAL_COMMUNITY): Payer: Medicare Other

## 2016-07-19 DIAGNOSIS — Z87891 Personal history of nicotine dependence: Secondary | ICD-10-CM | POA: Diagnosis not present

## 2016-07-19 DIAGNOSIS — J690 Pneumonitis due to inhalation of food and vomit: Secondary | ICD-10-CM | POA: Diagnosis present

## 2016-07-19 DIAGNOSIS — Z7951 Long term (current) use of inhaled steroids: Secondary | ICD-10-CM | POA: Diagnosis not present

## 2016-07-19 DIAGNOSIS — R6521 Severe sepsis with septic shock: Secondary | ICD-10-CM | POA: Diagnosis not present

## 2016-07-19 DIAGNOSIS — F411 Generalized anxiety disorder: Secondary | ICD-10-CM

## 2016-07-19 DIAGNOSIS — J449 Chronic obstructive pulmonary disease, unspecified: Secondary | ICD-10-CM | POA: Diagnosis present

## 2016-07-19 DIAGNOSIS — I4891 Unspecified atrial fibrillation: Secondary | ICD-10-CM | POA: Diagnosis present

## 2016-07-19 DIAGNOSIS — A401 Sepsis due to streptococcus, group B: Secondary | ICD-10-CM | POA: Diagnosis present

## 2016-07-19 DIAGNOSIS — I509 Heart failure, unspecified: Secondary | ICD-10-CM | POA: Diagnosis present

## 2016-07-19 DIAGNOSIS — R7881 Bacteremia: Secondary | ICD-10-CM | POA: Diagnosis not present

## 2016-07-19 DIAGNOSIS — Z7401 Bed confinement status: Secondary | ICD-10-CM | POA: Diagnosis not present

## 2016-07-19 DIAGNOSIS — I959 Hypotension, unspecified: Secondary | ICD-10-CM | POA: Diagnosis present

## 2016-07-19 DIAGNOSIS — J189 Pneumonia, unspecified organism: Secondary | ICD-10-CM | POA: Diagnosis not present

## 2016-07-19 DIAGNOSIS — Z189 Retained foreign body fragments, unspecified material: Secondary | ICD-10-CM | POA: Diagnosis not present

## 2016-07-19 DIAGNOSIS — Z515 Encounter for palliative care: Secondary | ICD-10-CM | POA: Diagnosis not present

## 2016-07-19 DIAGNOSIS — Z79899 Other long term (current) drug therapy: Secondary | ICD-10-CM | POA: Diagnosis not present

## 2016-07-19 DIAGNOSIS — G9341 Metabolic encephalopathy: Secondary | ICD-10-CM | POA: Diagnosis present

## 2016-07-19 DIAGNOSIS — E039 Hypothyroidism, unspecified: Secondary | ICD-10-CM | POA: Diagnosis present

## 2016-07-19 DIAGNOSIS — R627 Adult failure to thrive: Secondary | ICD-10-CM | POA: Diagnosis present

## 2016-07-19 DIAGNOSIS — Z85038 Personal history of other malignant neoplasm of large intestine: Secondary | ICD-10-CM | POA: Diagnosis not present

## 2016-07-19 DIAGNOSIS — Z66 Do not resuscitate: Secondary | ICD-10-CM | POA: Diagnosis present

## 2016-07-19 DIAGNOSIS — R1310 Dysphagia, unspecified: Secondary | ICD-10-CM | POA: Diagnosis present

## 2016-07-19 DIAGNOSIS — I110 Hypertensive heart disease with heart failure: Secondary | ICD-10-CM | POA: Diagnosis present

## 2016-07-19 DIAGNOSIS — Z7901 Long term (current) use of anticoagulants: Secondary | ICD-10-CM

## 2016-07-19 DIAGNOSIS — E872 Acidosis: Secondary | ICD-10-CM | POA: Diagnosis present

## 2016-07-19 DIAGNOSIS — F0390 Unspecified dementia without behavioral disturbance: Secondary | ICD-10-CM | POA: Diagnosis present

## 2016-07-19 DIAGNOSIS — F0391 Unspecified dementia with behavioral disturbance: Secondary | ICD-10-CM | POA: Diagnosis not present

## 2016-07-19 DIAGNOSIS — J9601 Acute respiratory failure with hypoxia: Secondary | ICD-10-CM

## 2016-07-19 DIAGNOSIS — A419 Sepsis, unspecified organism: Secondary | ICD-10-CM | POA: Diagnosis present

## 2016-07-19 DIAGNOSIS — G9340 Encephalopathy, unspecified: Secondary | ICD-10-CM

## 2016-07-19 DIAGNOSIS — N179 Acute kidney failure, unspecified: Secondary | ICD-10-CM | POA: Insufficient documentation

## 2016-07-19 DIAGNOSIS — E785 Hyperlipidemia, unspecified: Secondary | ICD-10-CM | POA: Diagnosis present

## 2016-07-19 DIAGNOSIS — Z7189 Other specified counseling: Secondary | ICD-10-CM | POA: Diagnosis not present

## 2016-07-19 DIAGNOSIS — B951 Streptococcus, group B, as the cause of diseases classified elsewhere: Secondary | ICD-10-CM | POA: Diagnosis not present

## 2016-07-19 LAB — CBC WITH DIFFERENTIAL/PLATELET
BASOS ABS: 0 10*3/uL (ref 0.0–0.1)
Basophils Relative: 0 %
EOS ABS: 0 10*3/uL (ref 0.0–0.7)
Eosinophils Relative: 0 %
HEMATOCRIT: 29.8 % — AB (ref 39.0–52.0)
Hemoglobin: 9.5 g/dL — ABNORMAL LOW (ref 13.0–17.0)
LYMPHS ABS: 0.5 10*3/uL — AB (ref 0.7–4.0)
Lymphocytes Relative: 4 %
MCH: 30.5 pg (ref 26.0–34.0)
MCHC: 31.9 g/dL (ref 30.0–36.0)
MCV: 95.8 fL (ref 78.0–100.0)
MONO ABS: 1.5 10*3/uL — AB (ref 0.1–1.0)
Monocytes Relative: 13 %
NEUTROS PCT: 83 %
Neutro Abs: 9.9 10*3/uL — ABNORMAL HIGH (ref 1.7–7.7)
PLATELETS: 228 10*3/uL (ref 150–400)
RBC: 3.11 MIL/uL — AB (ref 4.22–5.81)
RDW: 15.9 % — AB (ref 11.5–15.5)
WBC: 11.9 10*3/uL — AB (ref 4.0–10.5)

## 2016-07-19 LAB — URINALYSIS, ROUTINE W REFLEX MICROSCOPIC
Glucose, UA: NEGATIVE mg/dL
KETONES UR: NEGATIVE mg/dL
NITRITE: NEGATIVE
PH: 5.5 (ref 5.0–8.0)
PROTEIN: 30 mg/dL — AB
Specific Gravity, Urine: 1.021 (ref 1.005–1.030)

## 2016-07-19 LAB — COMPREHENSIVE METABOLIC PANEL
ALT: 18 U/L (ref 17–63)
AST: 38 U/L (ref 15–41)
Albumin: 3.2 g/dL — ABNORMAL LOW (ref 3.5–5.0)
Alkaline Phosphatase: 99 U/L (ref 38–126)
Anion gap: 13 (ref 5–15)
BILIRUBIN TOTAL: 1.3 mg/dL — AB (ref 0.3–1.2)
BUN: 33 mg/dL — AB (ref 6–20)
CALCIUM: 9 mg/dL (ref 8.9–10.3)
CO2: 23 mmol/L (ref 22–32)
Chloride: 102 mmol/L (ref 101–111)
Creatinine, Ser: 1.55 mg/dL — ABNORMAL HIGH (ref 0.61–1.24)
GFR, EST AFRICAN AMERICAN: 44 mL/min — AB (ref >60)
GFR, EST NON AFRICAN AMERICAN: 38 mL/min — AB (ref >60)
Glucose, Bld: 167 mg/dL — ABNORMAL HIGH (ref 65–99)
Potassium: 4.4 mmol/L (ref 3.5–5.1)
Sodium: 138 mmol/L (ref 135–145)
TOTAL PROTEIN: 6.7 g/dL (ref 6.5–8.1)

## 2016-07-19 LAB — BRAIN NATRIURETIC PEPTIDE: B Natriuretic Peptide: 266.4 pg/mL — ABNORMAL HIGH (ref 0.0–100.0)

## 2016-07-19 LAB — I-STAT ARTERIAL BLOOD GAS, ED
Acid-base deficit: 4 mmol/L — ABNORMAL HIGH (ref 0.0–2.0)
Bicarbonate: 21.9 mmol/L (ref 20.0–28.0)
O2 SAT: 100 %
PH ART: 7.319 — AB (ref 7.350–7.450)
PO2 ART: 264 mmHg — AB (ref 83.0–108.0)
Patient temperature: 99.9
TCO2: 23 mmol/L (ref 0–100)
pCO2 arterial: 42.8 mmHg (ref 32.0–48.0)

## 2016-07-19 LAB — URINE MICROSCOPIC-ADD ON

## 2016-07-19 MED ORDER — PIPERACILLIN-TAZOBACTAM 3.375 G IVPB
3.3750 g | Freq: Three times a day (TID) | INTRAVENOUS | Status: DC
  Administered 2016-07-20: 3.375 g via INTRAVENOUS
  Filled 2016-07-19 (×3): qty 50

## 2016-07-19 MED ORDER — SODIUM CHLORIDE 0.9 % IV BOLUS (SEPSIS)
1000.0000 mL | Freq: Once | INTRAVENOUS | Status: AC
Start: 2016-07-19 — End: 2016-07-19
  Administered 2016-07-19: 1000 mL via INTRAVENOUS

## 2016-07-19 MED ORDER — LEVALBUTEROL HCL 0.63 MG/3ML IN NEBU: 0.6300 mg | INHALATION_SOLUTION | RESPIRATORY_TRACT | Status: DC | PRN

## 2016-07-19 MED ORDER — HYDROCORTISONE NA SUCCINATE PF 100 MG IJ SOLR
100.0000 mg | Freq: Three times a day (TID) | INTRAMUSCULAR | Status: DC
  Administered 2016-07-20 – 2016-07-21 (×5): 100 mg via INTRAVENOUS
  Filled 2016-07-19 (×6): qty 2

## 2016-07-19 MED ORDER — BUDESONIDE 0.5 MG/2ML IN SUSP
0.5000 mg | Freq: Two times a day (BID) | RESPIRATORY_TRACT | Status: DC
  Administered 2016-07-20 – 2016-07-24 (×8): 0.5 mg via RESPIRATORY_TRACT
  Filled 2016-07-19 (×10): qty 2

## 2016-07-19 MED ORDER — IPRATROPIUM-ALBUTEROL 0.5-2.5 (3) MG/3ML IN SOLN
3.0000 mL | Freq: Four times a day (QID) | RESPIRATORY_TRACT | Status: DC
  Administered 2016-07-20 – 2016-07-23 (×13): 3 mL via RESPIRATORY_TRACT
  Filled 2016-07-19 (×15): qty 3

## 2016-07-19 MED ORDER — PIPERACILLIN-TAZOBACTAM 3.375 G IVPB 30 MIN
3.3750 g | Freq: Once | INTRAVENOUS | Status: AC
  Administered 2016-07-19: 3.375 g via INTRAVENOUS
  Filled 2016-07-19: qty 50

## 2016-07-19 MED ORDER — SODIUM CHLORIDE 0.9 % IV BOLUS (SEPSIS)
1000.0000 mL | Freq: Once | INTRAVENOUS | Status: AC
  Administered 2016-07-19: 1000 mL via INTRAVENOUS

## 2016-07-19 MED ORDER — DEXTROSE 5 % IV SOLN
30.0000 ug/min | INTRAVENOUS | Status: DC
  Administered 2016-07-20: 30 ug/min via INTRAVENOUS
  Filled 2016-07-19 (×3): qty 1

## 2016-07-19 MED ORDER — SODIUM CHLORIDE 0.9 % IV SOLN
INTRAVENOUS | Status: DC
  Administered 2016-07-20 – 2016-07-21 (×2): via INTRAVENOUS

## 2016-07-19 MED ORDER — LEVOTHYROXINE SODIUM 100 MCG IV SOLR
25.0000 ug | Freq: Every day | INTRAVENOUS | Status: DC
  Administered 2016-07-20: 25 ug via INTRAVENOUS
  Filled 2016-07-19 (×2): qty 5

## 2016-07-19 MED ORDER — VANCOMYCIN HCL IN DEXTROSE 1-5 GM/200ML-% IV SOLN
1000.0000 mg | Freq: Once | INTRAVENOUS | Status: AC
  Administered 2016-07-19: 1000 mg via INTRAVENOUS
  Filled 2016-07-19: qty 200

## 2016-07-19 MED ORDER — SODIUM CHLORIDE 0.9 % IV SOLN: 250.0000 mL | INTRAVENOUS | Status: DC | PRN

## 2016-07-19 MED ORDER — NOREPINEPHRINE BITARTRATE 1 MG/ML IV SOLN
0.0000 ug/min | Freq: Once | INTRAVENOUS | Status: AC
  Administered 2016-07-19: 10 ug/min via INTRAVENOUS
  Filled 2016-07-19: qty 4

## 2016-07-19 MED ORDER — FAMOTIDINE IN NACL 20-0.9 MG/50ML-% IV SOLN
20.0000 mg | Freq: Two times a day (BID) | INTRAVENOUS | Status: DC
Start: 2016-07-19 — End: 2016-07-20
  Filled 2016-07-19: qty 50

## 2016-07-19 MED ORDER — VANCOMYCIN HCL 500 MG IV SOLR
500.0000 mg | INTRAVENOUS | Status: DC
  Filled 2016-07-19: qty 500

## 2016-07-19 NOTE — ED Notes (Signed)
CCM at bedside 

## 2016-07-19 NOTE — Progress Notes (Signed)
Pharmacy Antibiotic Note  Todd [name] is a 82 y.o. male admitted on 07/03/2016 with respiratory distress.  Pharmacy has been consulted for vancomycin and zosyn dosing. Temp 99.9, WBC 11.9, and blood pressure soft at 80s/40s.   Plan: Vancomycin 1g IV x1 in ED  Vancomycin 500 mg IV every 24 hours.  Goal trough 15-20 mcg/mL. Zosyn 3.375g IV q8h (4 hour infusion).  Monitor renal function, culture results, clinical picture, and vancomycin trough as needed.   Height: 5\' 6"  (167.6 cm) Weight: 120 lb (54.4 kg) IBW/kg (Calculated) : 63.8  Temp (24hrs), Avg:99.9 F (37.7 C), Min:99.9 F (37.7 C), Max:99.9 F (37.7 C)   Recent Labs Lab 07/04/2016 2030  WBC 11.9*    CrCl cannot be calculated (Patient's most recent lab result is older than the maximum 21 days allowed.).    No Known Allergies  Antimicrobials this admission: 9/18 Vanc >>  9/18 zosyn >>   Dose adjustments this admission: N/A  Microbiology results: pending   Thank you for allowing pharmacy to be a part of this patient's care.  Argie Ramming, PharmD Pharmacy Resident  Pager [phone] 07/17/2016 9:37 PM

## 2016-07-19 NOTE — Progress Notes (Signed)
ANTICOAGULATION CONSULT NOTE - Initial Consult  Pharmacy Consult for Heparin Indication: atrial fibrillation  No Known Allergies  Patient Measurements: Height: 5\' 6"  (167.6 cm) Weight: 120 lb (54.4 kg) IBW/kg (Calculated) : 63.8  Vital Signs: Temp: 99.9 F (37.7 C) (09/18 2055) Temp Source: Rectal (09/18 2055) BP: 106/48 (09/18 2310) Pulse Rate: 75 (09/18 2310)  Labs:  Recent Labs  07/21/2016 2030  HGB 9.5*  HCT 29.8*  PLT 228  CREATININE 1.55*    Estimated Creatinine Clearance: 24.4 mL/min (by C-G formula based on SCr of 1.55 mg/dL (H)).   Medical History: Past Medical History:  Diagnosis Date  . A-fib ([place])   . Asthma   . CAP (community acquired pneumonia)    [name] 02/05/2015  . CHF (congestive heart failure) ([place]) 10/28/2011  . Colon cancer ([place])   . COPD (chronic obstructive pulmonary disease) ([place])   . Dementia    [name] 02/05/2015  . Failure to thrive in adult    /notes 02/05/2015  . Fall 02/04/2015   with altered mental status off of his baseline after he suffered a mechanical fall /notes 02/04/2015  . Forgetfulness   . Hyperlipidemia   . Hypertension   . Hypothyroidism    [name] 02/05/2015  . Mild aortic insufficiency   . PVC's (premature ventricular contractions)   . Shortness of breath     Medications:  No current facility-administered medications on file prior to encounter.    Current Outpatient Prescriptions on File Prior to Encounter  Medication Sig Dispense Refill  . acetaminophen (TYLENOL) 325 MG tablet Take 650 mg by mouth every 6 (six) hours as needed (temp over 99 degrees/pain).     . ADVAIR DISKUS 500-50 MCG/DOSE AEPB INHALE 1 PUFF TWICE A DAY 180 each 1  . ferrous sulfate 325 (65 FE) MG tablet TAKE 1 TABLET BY MOUTH EVERY DAY 30 tablet 11  . fluticasone (FLONASE) 50 MCG/ACT nasal spray Place 2 sprays into both nostrils daily.    Marland Kitchen guaiFENesin 200 MG tablet Take 200 mg by mouth every 4 (four) hours as needed for cough.     Marland Kitchen  ipratropium-albuterol (DUONEB) 0.5-2.5 (3) MG/3ML SOLN Take 3 mLs by nebulization every 6 (six) hours as needed (shortness of breath/ wheezing).     Marland Kitchen levalbuterol (XOPENEX) 0.63 MG/3ML nebulizer solution Take 3 mLs (0.63 mg total) by nebulization every 4 (four) hours as needed for wheezing or shortness of breath. 3 mL 12  . levothyroxine (SYNTHROID, LEVOTHROID) 50 MCG tablet Take 50 mcg by mouth daily before breakfast.    . losartan (COZAAR) 50 MG tablet Take 50 mg by mouth daily.  11  . mirtazapine (REMERON) 15 MG tablet Take 15 mg by mouth at bedtime.    Marland Kitchen omeprazole (PRILOSEC) 40 MG capsule Take 40 mg by mouth every evening.     . potassium chloride SA (K-DUR,KLOR-CON) 20 MEQ tablet Take 40 mEq by mouth daily.    . rivaroxaban (XARELTO) 20 MG TABS tablet Take 1 tablet (20 mg total) by mouth daily with supper. (Patient taking differently: Take 20 mg by mouth every evening. ) 30 tablet   . tamsulosin (FLOMAX) 0.4 MG CAPS capsule Take 1 capsule (0.4 mg total) by mouth daily. 30 capsule   . venlafaxine XR (EFFEXOR-XR) 37.5 MG 24 hr capsule Take 75 mg by mouth daily.        Assessment: 82 y.o. male admitted with aspiration PNA/sepsis, h/o Afib and Xarelto on hold, for heparin.  Last dose of Xarelto at 1700  on 9/18  Goal of Therapy:  APTT 66-102 while Xarelto affecting anti-Xa level  Heparin level 0.3-0.7 Monitor platelets by anticoagulation protocol: Yes   Plan:  Tomorrow at 6 pm, start heparin 650 units/hr APTT 8 hours after starting infusion   Caryl Pina 07/23/2016,11:41 PM

## 2016-07-19 NOTE — ED Notes (Signed)
Pt arrives via EMS from [facility] with respiratory distress. Pt diaphoretic, cool to the touch and pale upon arrival, wet sounds coming from airway. Pt responsive to pain. Full code per facility, family aggreeable to DNR status.

## 2016-07-19 NOTE — H&P (Signed)
PULMONARY / CRITICAL CARE MEDICINE   Name: Todd [name] MRN: [id number] DOB: 1923/12/16    ADMISSION DATE:  07/23/2016 CONSULTATION DATE:  07/26/2016  REFERRING MD:  Sabra Heck - EDP  CHIEF COMPLAINT:  Respiratory Distress  HISTORY OF PRESENT ILLNESS:  Pt is encephelopathic; therefore, this HPI is obtained from chart review. Todd [name] is a 82 y.o. male with PMH as outlined below and who resides at [facility].  He has hx of recurrent PNA and UTI.  He was brought to [facility] ED 9/18 in respiratory distress.  Per family, pt vomited earlier in the evening raising concern for aspiration once again. In ED, he was started on BiPAP and was found to be profoundly hypotensive with initial SBP in 60's.  He was given 64ml/kg bolus; however, BP did not respond. CXR demonstrates RUL and RML opacities concerning for aspiration PNA.  After chart review, pt had admission in January and at the time, was seen by palliative care to establish goals of care.  Decision was made to list him as full DNR at the time.  On this admission, pt was started on levophed while in ED for shock, suspect septic from presumed aspiration PNA.  I personally called pt's daughter [name] to clarify pt's wishes / advanced directives.  After extensive discussion, she confirmed that pt would not want to undergo any heroic measures including no CPR / defibrillations and / or mechanical ventilation.  She is comfortable with and requests that we continue vasopressors; however, would like to avoid CVL placement.  We will therefore switch to phenylephrine and will hope to avoid CVL placement.   PAST MEDICAL HISTORY :  He  has a past medical history of A-fib ([place]); Asthma; CAP (community acquired pneumonia); CHF (congestive heart failure) ([place]) (10/28/2011); Colon cancer ([place]); COPD (chronic obstructive pulmonary disease) ([place]); Dementia; Failure to thrive in adult; Fall (02/04/2015); Forgetfulness; Hyperlipidemia; Hypertension; Hypothyroidism;  Mild aortic insufficiency; PVC's (premature ventricular contractions); and Shortness of breath.  PAST SURGICAL HISTORY: He  has a past surgical history that includes Colon surgery and Central line insertion (09/08/2011).  No Known Allergies  No current facility-administered medications on file prior to encounter.    Current Outpatient Prescriptions on File Prior to Encounter  Medication Sig  . acetaminophen (TYLENOL) 325 MG tablet Take 650 mg by mouth every 6 (six) hours as needed (temp over 99 degrees/pain).   . ADVAIR DISKUS 500-50 MCG/DOSE AEPB INHALE 1 PUFF TWICE A DAY  . ferrous sulfate 325 (65 FE) MG tablet TAKE 1 TABLET BY MOUTH EVERY DAY  . fluticasone (FLONASE) 50 MCG/ACT nasal spray Place 2 sprays into both nostrils daily.  Marland Kitchen guaiFENesin 200 MG tablet Take 200 mg by mouth every 4 (four) hours as needed for cough.   Marland Kitchen ipratropium-albuterol (DUONEB) 0.5-2.5 (3) MG/3ML SOLN Take 3 mLs by nebulization every 6 (six) hours as needed (shortness of breath/ wheezing).   Marland Kitchen levalbuterol (XOPENEX) 0.63 MG/3ML nebulizer solution Take 3 mLs (0.63 mg total) by nebulization every 4 (four) hours as needed for wheezing or shortness of breath.  . levothyroxine (SYNTHROID, LEVOTHROID) 50 MCG tablet Take 50 mcg by mouth daily before breakfast.  . losartan (COZAAR) 50 MG tablet Take 50 mg by mouth daily.  . mirtazapine (REMERON) 15 MG tablet Take 15 mg by mouth at bedtime.  Marland Kitchen omeprazole (PRILOSEC) 40 MG capsule Take 40 mg by mouth every evening.   . potassium chloride SA (K-DUR,KLOR-CON) 20 MEQ tablet Take 40 mEq by mouth daily.  Marland Kitchen  rivaroxaban (XARELTO) 20 MG TABS tablet Take 1 tablet (20 mg total) by mouth daily with supper. (Patient taking differently: Take 20 mg by mouth every evening. )  . tamsulosin (FLOMAX) 0.4 MG CAPS capsule Take 1 capsule (0.4 mg total) by mouth daily.  Marland Kitchen venlafaxine XR (EFFEXOR-XR) 37.5 MG 24 hr capsule Take 75 mg by mouth daily.     FAMILY HISTORY:  His indicated that his  mother is deceased. He indicated that his father is deceased. He indicated that one of his two sisters is deceased.    SOCIAL HISTORY: He  reports that he has quit smoking. He has never used smokeless tobacco. He reports that he does not drink alcohol or use drugs.  REVIEW OF SYSTEMS:   Unable to obtain due to encephalopathy.  SUBJECTIVE:  On BiPAP, comfortable.  VITAL SIGNS: BP (!) 81/70   Pulse 75   Temp 99.9 F (37.7 C) (Rectal)   Resp (!) 30   Ht 5\' 6"  (1.676 m)   Wt 120 lb (54.4 kg)   SpO2 93%   BMI 19.37 kg/m   HEMODYNAMICS:    VENTILATOR SETTINGS:    INTAKE / OUTPUT: No intake/output data recorded.   PHYSICAL EXAMINATION: General: Chronically ill appearing male, frail, in NAD. Neuro:  Somnolent, withdraws to pain. HEENT: [place]/AT. PERRL, sclerae anicteric. Cardiovascular: IRIR, no M/R/G.  Lungs: Respirations even and unlabored.  Coarse on right.  BiPAP in place. Abdomen: BS x 4, soft, NT/ND.  Musculoskeletal: No gross deformities, no edema.  Muscle wasting throughout. Skin: Intact, warm, no rashes.  LABS:  BMET  Recent Labs Lab 07/04/2016 2030  NA 138  K 4.4  CL 102  CO2 23  BUN 33*  CREATININE 1.55*  GLUCOSE 167*    Electrolytes  Recent Labs Lab 07/28/2016 2030  CALCIUM 9.0    CBC  Recent Labs Lab 07/21/2016 2030  WBC 11.9*  HGB 9.5*  HCT 29.8*  PLT 228    Coag's No results for input(s): APTT, INR in the last 168 hours.  Sepsis Markers No results for input(s): LATICACIDVEN, PROCALCITON, O2SATVEN in the last 168 hours.  ABG  Recent Labs Lab 07/09/2016 2113  PHART 7.319*  PCO2ART 42.8  PO2ART 264.0*    Liver Enzymes  Recent Labs Lab 07/13/2016 2030  AST 38  ALT 18  ALKPHOS 99  BILITOT 1.3*  ALBUMIN 3.2*    Cardiac Enzymes No results for input(s): TROPONINI, PROBNP in the last 168 hours.  Glucose No results for input(s): GLUCAP in the last 168 hours.  Imaging Dg Chest Port 1 View  Result Date:  07/10/2016 CLINICAL DATA:  Respiratory distress EXAM: PORTABLE CHEST 1 VIEW COMPARISON:  11/27/2015 FINDINGS: Cardiomediastinal silhouette is stable. There is patchy somewhat streaky infiltrate in right upper lobe. More confluent infiltrate in right apex and right base. Findings highly suspicious for pneumonia. Left lung is clear. No pulmonary edema. Atherosclerotic calcifications of thoracic aorta. IMPRESSION: There is patchy somewhat streaky infiltrate in right upper lobe. More confluent infiltrate in right apex and right base. Findings highly suspicious for pneumonia. Left lung is clear. No pulmonary edema. Followup PA and lateral chest X-ray is recommended in 3-4 weeks following trial of antibiotic therapy to ensure resolution and exclude underlying malignancy. Electronically Signed   By: Lahoma Crocker M.D.   On: 07/24/2016 21:21     STUDIES:  CXR 9/18 > RUL / RML opacities.  CULTURES: Blood 9/18 >  Sputum 9/18 > Urine 9/18 >  ANTIBIOTICS: Vanc 9/18 >  Zosyn 9/18 >  SIGNIFICANT EVENTS: 9/18 > admit.  LINES/TUBES: None.  DISCUSSION: 82 y.o. male admitted 9/18 with septic shock presumed due to aspiration PNA.  Also started on BiPAP for increased WOB.  ASSESSMENT / PLAN:  PULMONARY A: Respiratory insufficiency - started on BiPAP due to increased WOB. Presumed aspiration PNA. Hx recurrent aspiration, COPD, asthma. P:   Continue BiPAP (risk of occult aspiration explained to pt's daughter). Abx / cultures per ID section. Budesonide in lieu of preadmission Advair. DuoNebs, levalbuterol. Pulmonary hygiene. CXR in AM.  CARDIOVASCULAR A:  Septic shock - presume due to aspiration PNA. Hx A.fib (on xarelto), HTN, HLD, CHF (Echo from April 2016 with EF 55-60%, mild AI).  Sepsis - Repeat Assessment  Performed at:    2300  Vitals     Blood pressure (!) 106/48, pulse 75, temperature 99.9 F (37.7 C), temperature source Rectal, resp. rate (!) 30, height 5\' 6"  (1.676 m), weight 120 lb  (54.4 kg), SpO2 100 %.  Heart:     Irregular rate and rhythm  Lungs:    Rhonchi  Capillary Refill:   <2 sec  Peripheral Pulse:   Radial pulse palpable  Skin:     Normal Color and Pale   P:  Neosynephrine as needed to maintain MAP > 65. Avoid CVL placement per discussion with pt's daughter. Assess cortisol. Start empiric stress steroids. Assess troponin, lactate. Heparin gtt in lieu of preadmission rivaroxaban. Hold preadmission losartan, rivaroxaban.  RENAL A:   AKI. P:   NS @ 75. BMP in AM.  GASTROINTESTINAL A:   GERD. Nutrition. P:   Famotidine. NPO.  HEMATOLOGIC A:  Anemia.  VTE Prophylaxis. P:  Transfuse for Hgb < 7. SCD's / heparin gtt. CBC in AM.  INFECTIOUS A:   Septic shock - presume due to aspiration PNA. Hx recurrent UTI. P:   Abx as above (vanc / zosyn).  Follow cultures as above.  ENDOCRINE A:   Hx hypothyroidism.  P:   Continue preadmission synthroid, change to IV formulation. Assess TSH.  NEUROLOGIC A:   Acute encephalopathy - presumed due to septic shock. Hx dementia, FTT, falls. P:   Continue supportive care. Hold preadmission mirtazapine, venlafaxine.   Family updated: Daughter [name] called over phone.  Extensive discussion regarding goals of care.  She confirmed that pt would not want to undergo any heroic measures including no CPR / defibrillations and / or mechanical ventilation.  She is comfortable with and requests that we continue vasopressors; however, would like to avoid CVL placement.  We will therefore switch to phenylephrine and will hope to avoid CVL placement.  Interdisciplinary Family Meeting v Palliative Care Meeting:  Due by: 9/25.  CC time including family discussion: 60 minutes.   Montey Hora, [place] [facility] Pager: [phone]  or [phone] 07/08/2016, 10:59 PM  Attending Note:  82 year old male who was previously DNR who presents with likely aspiration  PNA and acute encephalopathy.  On exam this AM patient is completely unresponsive and remains on pressors.  Lungs with coarse BS diffusely R>L.  I am concerned for airway protection at this point.  I reviewed CXR myself, R>L infiltrate indicating likely aspiration.  Conversation with family, LCB with BiPAP and pressors only.  Clinical situation has changed over the past 4 hours.  Family is on the way in evidently.  Will need an EOL discussion upon family arrival as I do not believe patient  is able to protect his airway at this time and comfort would be the most appropriate.  In the meantime, continue current care.  The patient is critically ill with multiple organ systems failure and requires high complexity decision making for assessment and support, frequent evaluation and titration of therapies, application of advanced monitoring technologies and extensive interpretation of multiple databases.   Critical Care Time devoted to patient care services described in this note is  45  Minutes. This time reflects time of care of this signee Dr Jennet Maduro. This critical care time does not reflect procedure time, or teaching time or supervisory time of PA/NP/Med student/Med Resident etc but could involve care discussion time.  Rush Farmer, M.D. [facility] Pulmonary/Critical Care Medicine. Pager: [phone]. After hours pager: [phone].

## 2016-07-19 NOTE — ED Provider Notes (Signed)
I saw and evaluated the patient, reviewed the resident's note and I agree with the findings and plan.  Pertinent History: The patient is a 81 year old male, history of heart disease, atrial fibrillation, currently staying at [facility], he was found to be tachycardic, respiratory distress and hypoxic. Paramedics were unable to get his oxygen up despite being aggressive with nonrebreather. The patient is unable to give much information because he is so short of breath.  Pertinent Exam findings: Increased work of breathing, rhonchi and rales especially on the right, accessory muscle use, the patient is somnolent but able to be aroused, no significant peripheral edema, oropharynx with mucous, required deep suctioning.  The patient is acutely ill, he is critically ill and will need high level of care. He is hypotensive despite fluid resuscitation. I suspect is A significant pneumonia, possibly aspiration. He is clearly septic, he will need admission to the hospital, likely ICU, discussed with the family members who state that they do not want him to have any aggressive resuscitation but are unsure if they want to withdraw intubation as an option however at this time BiPAP seems to be working.  CRITICAL CARE Performed by: Johnna Acosta Total critical care time: 35 minutes Critical care time was exclusive of separately billable procedures and treating other patients. Critical care was necessary to treat or prevent imminent or life-threatening deterioration. Critical care was time spent personally by me on the following activities: development of treatment plan with patient and/or surrogate as well as nursing, discussions with consultants, evaluation of patient's response to treatment, examination of patient, obtaining history from patient or surrogate, ordering and performing treatments and interventions, ordering and review of laboratory studies, ordering and review of radiographic studies, pulse oximetry  and re-evaluation of patient's condition.   EKG Interpretation  Date/Time:  Monday July 19 2016 20:44:07 EDT Ventricular Rate:  106 PR Interval:    QRS Duration: 97 QT Interval:  341 QTC Calculation: 449 R Axis:   -21 Text Interpretation:  Atrial fibrillation Low voltage, extremity leads Anteroseptal infarct, old Nonspecific repol abnormality, diffuse leads Since last tracing rate faster Confirmed by Alondra Sahni  MD, Gawain Crombie ([id number]) on 07/03/2016 9:36:59 PM        I personally interpreted the EKG as well as the resident and agree with the interpretation on the resident's chart.  Final diagnoses:  Sepsis, due to unspecified organism ([place])  HCAP (healthcare-associated pneumonia)      Noemi Chapel, MD 07/20/16 [phone]

## 2016-07-19 NOTE — ED Provider Notes (Signed)
[facility] DEPT Provider Note   CSN: [id number] Arrival date & time: 07/02/2016  2038   History   Chief Complaint Chief Complaint  Patient presents with  . Hypotension  . Respiratory Distress    HPI Todd [name] is a 82 y.o. male.  The history is provided by the EMS personnel, a relative and medical records. The history is limited by the condition of the patient.   82 year old male with history of dementia, CHF, COPD, colon cancer, A. fib, hypothyroidism, hypertension, hyperlipidemia, numerous episodes of pneumonia presenting from nursing facility with shortness of breath. Onset was today. Worsening since then. Now severe. EMS was called and found patient to have sats in the 70s on 2 L nasal cannula. He was working hard to breathe. He is placed on nonrebreather with increasing sats to 80s by the time they arrived here. Family reports that he has had some vomiting recently and that in the past he has had acute shortness of breath from aspirating, which they suspect may have happened today as well. However, they also state that he is prone to pneumonia and this also is how acts when he gets pneumonia.   Past Medical History:  Diagnosis Date  . A-fib ([place])   . Asthma   . CAP (community acquired pneumonia)    [name] 02/05/2015  . CHF (congestive heart failure) ([place]) 10/28/2011  . Colon cancer ([place])   . COPD (chronic obstructive pulmonary disease) ([place])   . Dementia    [name] 02/05/2015  . Failure to thrive in adult    /notes 02/05/2015  . Fall 02/04/2015   with altered mental status off of his baseline after he suffered a mechanical fall /notes 02/04/2015  . Forgetfulness   . Hyperlipidemia   . Hypertension   . Hypothyroidism    [name] 02/05/2015  . Mild aortic insufficiency   . PVC's (premature ventricular contractions)   . Shortness of breath     Patient Active Problem List   Diagnosis Date Noted  . Septic shock ([place]) 07/10/2016  . AKI (acute kidney injury) ([place])   .  Essential hypertension 07/13/2016  . Acute and chronic respiratory failure with hypoxia ([place]) 04/25/2016  . Dementia 01/28/2016  . Pressure ulcer 11/28/2015  . Malnutrition of moderate degree 11/02/2015  . Inflammatory arthritis ([place]) 06/14/2015  . Dysphagia 04/02/2015  . DNR (do not resuscitate) 04/02/2015  . COPD (chronic obstructive pulmonary disease) ([place]) 03/29/2015  . Anemia, iron deficiency 03/13/2015  . Aspiration pneumonia of right lung ([place]) 03/08/2015  . Acute encephalopathy 03/08/2015  . Protein-calorie malnutrition, severe ([place]) 02/06/2015  . Weakness 02/05/2015  . Failure to thrive in adult 02/05/2015  . Pancreatitis, gallstone   . Elevated LFTs   . Pancreatitis 09/05/2014  . Hypothyroidism 09/05/2014  . Gallstone pancreatitis 11/23/2013  . Chronic diastolic CHF (congestive heart failure) ([place]) 10/28/2011  . Chronic atrial fibrillation ([place]) 09/06/2011  . HLD (hyperlipidemia) 01/24/2008  . ALLERGIC RHINITIS 01/24/2008  . Asthma 01/24/2008  . COLON CANCER 01/24/2008  . CHEST XRAY, ABNORMAL 01/24/2008    Past Surgical History:  Procedure Laterality Date  . CENTRAL LINE INSERTION  09/08/2011      . COLON SURGERY     Partial hemecolectomy        Home Medications    Prior to Admission medications   Medication Sig Start Date End Date Taking? Authorizing Provider  acetaminophen (TYLENOL) 325 MG tablet Take 650 mg by mouth every 6 (six) hours as needed (temp over 99 degrees/pain).  Yes Historical Provider, MD  ADVAIR DISKUS 500-50 MCG/DOSE AEPB INHALE 1 PUFF TWICE A DAY 12/23/14  Yes Susy Frizzle, MD  ferrous sulfate 325 (65 FE) MG tablet TAKE 1 TABLET BY MOUTH EVERY DAY 12/23/14  Yes Susy Frizzle, MD  fluticasone [facility]) 50 MCG/ACT nasal spray Place 2 sprays into both nostrils daily.   Yes Historical Provider, MD  guaiFENesin 200 MG tablet Take 200 mg by mouth every 4 (four) hours as needed for cough.    Yes Historical Provider, MD    ipratropium-albuterol (DUONEB) 0.5-2.5 (3) MG/3ML SOLN Take 3 mLs by nebulization every 6 (six) hours as needed (shortness of breath/ wheezing).    Yes Historical Provider, MD  levalbuterol (XOPENEX) 0.63 MG/3ML nebulizer solution Take 3 mLs (0.63 mg total) by nebulization every 4 (four) hours as needed for wheezing or shortness of breath. 03/11/15  Yes Geradine Girt, DO  levothyroxine (SYNTHROID, LEVOTHROID) 50 MCG tablet Take 50 mcg by mouth daily before breakfast.   Yes Historical Provider, MD  losartan (COZAAR) 50 MG tablet Take 50 mg by mouth daily. 01/23/15  Yes Historical Provider, MD  mirtazapine (REMERON) 15 MG tablet Take 15 mg by mouth at bedtime.   Yes Historical Provider, MD  Nutritional Supplements (NUTRITIONAL SUPPLEMENT PO) Take by mouth 3 (three) times daily. Magic cup (unknown quantity) 10am, 59m, 8pm   Yes Historical Provider, MD  omeprazole (PRILOSEC) 40 MG capsule Take 40 mg by mouth every evening.    Yes Historical Provider, MD  OXYGEN Inhale 2 L into the lungs continuous.   Yes Historical Provider, MD  polyvinyl alcohol (ARTIFICIAL TEARS) 1.4 % ophthalmic solution Place 2 drops into both eyes 4 (four) times daily as needed for dry eyes.   Yes Historical Provider, MD  potassium chloride SA (K-DUR,KLOR-CON) 20 MEQ tablet Take 40 mEq by mouth daily.   Yes Historical Provider, MD  rivaroxaban (XARELTO) 20 MG TABS tablet Take 1 tablet (20 mg total) by mouth daily with supper. Patient taking differently: Take 20 mg by mouth every evening.  03/11/15  Yes Geradine Girt, DO  tamsulosin (FLOMAX) 0.4 MG CAPS capsule Take 1 capsule (0.4 mg total) by mouth daily. 03/11/15  Yes Geradine Girt, DO  venlafaxine XR (EFFEXOR-XR) 37.5 MG 24 hr capsule Take 75 mg by mouth daily.    Yes Historical Provider, MD    Family History Family History  Problem Relation Age of Onset  . Hyperlipidemia Father   . Hypertension Father   . Heart disease Father   . Asthma Father   . Stroke Father   . Heart  failure Father   . Heart disease Mother   . Heart failure Mother   . Heart attack Sister     Social History Social History  Substance Use Topics  . 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 Smokeless tobacco: Never Used     Comment: quit 30+ yrs ago  . Alcohol use No     Allergies   Review of patient's allergies indicates no known allergies.   Review of Systems Review of Systems  Unable to perform ROS: Severe respiratory distress  Constitutional: Positive for diaphoresis. Negative for fever.  Respiratory: Positive for cough and shortness of breath.   Gastrointestinal: Positive for nausea and vomiting. Negative for abdominal pain.    Physical Exam Updated Vital Signs BP (!) 106/48   Pulse 75   Temp 99.9 F (37.7 C) (Rectal)   Resp (!) 30   Ht 5\' 6"  (1.676  m)   Wt 54.4 kg   SpO2 100%   BMI 19.37 kg/m   Physical Exam  Constitutional: He appears well-developed. He appears distressed.  HENT:  Head: Normocephalic and atraumatic.  Eyes: Conjunctivae are normal.  Neck: Neck supple.  Cardiovascular: Regular rhythm.  Tachycardia present.   Pulmonary/Chest: Accessory muscle usage present. Tachypnea noted. He is in respiratory distress. He has rhonchi. He has rales.  Abdominal: Soft. There is no tenderness.  Musculoskeletal: He exhibits no edema.  Neurological: He is alert.  Skin: Skin is warm. He is diaphoretic.  Psychiatric: He has a normal mood and affect.  Nursing note and vitals reviewed.    ED Treatments / Results  Labs (all labs ordered are listed, but only abnormal results are displayed) Labs Reviewed  COMPREHENSIVE METABOLIC PANEL - Abnormal; Notable for the following:       Result Value   Glucose, Bld 167 (*)    BUN 33 (*)    Creatinine, Ser 1.55 (*)    Albumin 3.2 (*)    Total Bilirubin 1.3 (*)    GFR calc non Af Amer 38 (*)    GFR calc Af Amer 44 (*)    All other components within normal limits  CBC WITH DIFFERENTIAL/PLATELET - Abnormal; Notable for the  following:    WBC 11.9 (*)    RBC 3.11 (*)    Hemoglobin 9.5 (*)    HCT 29.8 (*)    RDW 15.9 (*)    Neutro Abs 9.9 (*)    Lymphs Abs 0.5 (*)    Monocytes Absolute 1.5 (*)    All other components within normal limits  URINALYSIS, ROUTINE W REFLEX MICROSCOPIC (NOT AT [facility]) - Abnormal; Notable for the following:    Color, Urine AMBER (*)    APPearance CLOUDY (*)    Hgb urine dipstick TRACE (*)    Bilirubin Urine SMALL (*)    Protein, ur 30 (*)    Leukocytes, UA SMALL (*)    All other components within normal limits  BRAIN NATRIURETIC PEPTIDE - Abnormal; Notable for the following:    B Natriuretic Peptide 266.4 (*)    All other components within normal limits  URINE MICROSCOPIC-ADD ON - Abnormal; Notable for the following:    Squamous Epithelial / LPF 0-5 (*)    Bacteria, UA MANY (*)    Casts HYALINE CASTS (*)    All other components within normal limits  I-STAT ARTERIAL BLOOD GAS, ED - Abnormal; Notable for the following:    pH, Arterial 7.319 (*)    pO2, Arterial 264.0 (*)    Acid-base deficit 4.0 (*)    All other components within normal limits  CULTURE, BLOOD (ROUTINE X 2)  CULTURE, BLOOD (ROUTINE X 2)  URINE CULTURE  CULTURE, RESPIRATORY (NON-EXPECTORATED)  URINE CULTURE  CULTURE, BLOOD (ROUTINE X 2)  CULTURE, BLOOD (ROUTINE X 2)  TSH  CORTISOL  CBC  BASIC METABOLIC PANEL  MAGNESIUM  PHOSPHORUS  I-STAT CG4 LACTIC ACID, ED  I-STAT TROPOININ, ED  I-STAT CG4 LACTIC ACID, ED    EKG  EKG Interpretation  Date/Time:  Monday July 19 2016 20:44:07 EDT Ventricular Rate:  106 PR Interval:    QRS Duration: 97 QT Interval:  341 QTC Calculation: 449 R Axis:   -21 Text Interpretation:  Atrial fibrillation Low voltage, extremity leads Anteroseptal infarct, old Nonspecific repol abnormality, diffuse leads Since last tracing rate faster Confirmed by MILLER  MD, BRIAN ([id number]) on 07/24/2016 9:36:59 PM  Radiology Dg Chest Port 1 View  Result Date:  07/29/2016 CLINICAL DATA:  Respiratory distress EXAM: PORTABLE CHEST 1 VIEW COMPARISON:  11/27/2015 FINDINGS: Cardiomediastinal silhouette is stable. There is patchy somewhat streaky infiltrate in right upper lobe. More confluent infiltrate in right apex and right base. Findings highly suspicious for pneumonia. Left lung is clear. No pulmonary edema. Atherosclerotic calcifications of thoracic aorta. IMPRESSION: There is patchy somewhat streaky infiltrate in right upper lobe. More confluent infiltrate in right apex and right base. Findings highly suspicious for pneumonia. Left lung is clear. No pulmonary edema. Followup PA and lateral chest X-ray is recommended in 3-4 weeks following trial of antibiotic therapy to ensure resolution and exclude underlying malignancy. Electronically Signed   By: Lahoma Crocker M.D.   On: 07/23/2016 21:21    Procedures Procedures (including critical care time)  Medications Ordered in ED Medications  vancomycin (VANCOCIN) 500 mg in sodium chloride 0.9 % 100 mL IVPB (not administered)  piperacillin-tazobactam (ZOSYN) IVPB 3.375 g (not administered)  budesonide (PULMICORT) nebulizer solution 0.5 mg (not administered)  ipratropium-albuterol (DUONEB) 0.5-2.5 (3) MG/3ML nebulizer solution 3 mL (not administered)  levalbuterol (XOPENEX) nebulizer solution 0.63 mg (not administered)  levothyroxine (SYNTHROID, LEVOTHROID) injection 25 mcg (not administered)  hydrocortisone sodium succinate (SOLU-CORTEF) 100 MG injection 100 mg (not administered)  famotidine (PEPCID) IVPB 20 mg premix (not administered)  phenylephrine (NEO-SYNEPHRINE) 10 mg in dextrose 5 % 250 mL (0.04 mg/mL) infusion (not administered)  0.9 %  sodium chloride infusion (not administered)  0.9 %  sodium chloride infusion (not administered)  sodium chloride 0.9 % bolus 1,000 mL (0 mLs Intravenous Stopped 07/13/2016 2140)  norepinephrine (LEVOPHED) 4 mg in dextrose 5 % 250 mL (0.016 mg/mL) infusion (10 mcg/min  Intravenous Rate/Dose Change 07/24/2016 2229)  sodium chloride 0.9 % bolus 1,000 mL (0 mLs Intravenous Stopped 07/15/2016 2150)  vancomycin (VANCOCIN) IVPB 1000 mg/200 mL premix (1,000 mg Intravenous New Bag/Given 07/13/2016 2150)  piperacillin-tazobactam (ZOSYN) IVPB 3.375 g (3.375 g Intravenous New Bag/Given 07/28/2016 2150)     Initial Impression / Assessment and Plan / ED Course  I have reviewed the triage vital signs and the nursing notes.  Pertinent labs & imaging results that were available during my care of the patient were reviewed by me and considered in my medical decision making (see chart for details).  Clinical Course    82 year old male with history of dementia, CHF, COPD, colon cancer, A. fib, hypothyroidism, hypertension, hyperlipidemia, numerous episodes of pneumonia presenting from nursing facility with acute respiratory distress, as above. Upon arrival he is ill appearing and in extremis, with increased work of breathing and coarse bilateral rhonchi and rales, more prominent on the right. He is pale, diaphoretic, hypotensive, and tachycardic.   Patient had deep suctioning by respiratory therapy upon arrival. Some improvement in breath sounds afterwards but increased work of breathing remains. Placed on BiPAP with improvement in oxygen saturations to 90s.  Workup as above, notable for EKG with atrial fibrillation, chest x-ray with right-sided infiltrate consistent with pneumonia, leukocytosis, baseline anemia. Due to concern for HCAP, patient given full 30 cc/kg bolus IVF. He remained hypotensive after IVF and was started on levophed. Cultures were sent and patient was started on abx coverage with vanc/zosyn.   Patient was a full code at his facility. Discussed findings with family at bedside. They would like patient to be a DO NOT RESUSCITATE with no chest compressions or shocks, but are undecided at this point about intubation. They are OK with medications including  pressors.   Case  discussed with Dr. Sabra Heck who oversaw management of this patient.    Sepsis - Repeat Assessment  Performed at:   After IVF given, initiating pressors   Vitals     Blood pressure (!) 62/35, pulse 80, temperature 99.9 F (37.7 C), temperature source Rectal, resp. rate (!) 27, SpO2 100 %.  Heart:     Tachycardic  Lungs:    Rales and Rhonchi  Capillary Refill:   > 2 sec  Peripheral Pulse:   Radial pulse palpable  Skin:     Pale and Diaphoretic    Final Clinical Impressions(s) / ED Diagnoses   Final diagnoses:  Sepsis, due to unspecified organism ([place])  HCAP (healthcare-associated pneumonia)     New Prescriptions New Prescriptions   No medications on file     Ivin Booty, MD 07/24/2016 2352    Noemi Chapel, MD 07/20/16 [phone]

## 2016-07-20 ENCOUNTER — Inpatient Hospital Stay (HOSPITAL_COMMUNITY): Payer: Medicare Other

## 2016-07-20 LAB — TSH: TSH: 1.34 u[IU]/mL (ref 0.350–4.500)

## 2016-07-20 LAB — CBC
HCT: 26.1 % — ABNORMAL LOW (ref 39.0–52.0)
Hemoglobin: 8.5 g/dL — ABNORMAL LOW (ref 13.0–17.0)
MCH: 30.5 pg (ref 26.0–34.0)
MCHC: 32.6 g/dL (ref 30.0–36.0)
MCV: 93.5 fL (ref 78.0–100.0)
PLATELETS: 189 10*3/uL (ref 150–400)
RBC: 2.79 MIL/uL — AB (ref 4.22–5.81)
RDW: 15.9 % — ABNORMAL HIGH (ref 11.5–15.5)
WBC: 4.2 10*3/uL (ref 4.0–10.5)

## 2016-07-20 LAB — BLOOD CULTURE ID PANEL (REFLEXED)
ACINETOBACTER BAUMANNII: NOT DETECTED
CANDIDA ALBICANS: NOT DETECTED
CANDIDA PARAPSILOSIS: NOT DETECTED
CANDIDA TROPICALIS: NOT DETECTED
Candida glabrata: NOT DETECTED
Candida krusei: NOT DETECTED
ENTEROBACTERIACEAE SPECIES: NOT DETECTED
Enterobacter cloacae complex: NOT DETECTED
Enterococcus species: NOT DETECTED
Escherichia coli: NOT DETECTED
HAEMOPHILUS INFLUENZAE: NOT DETECTED
KLEBSIELLA OXYTOCA: NOT DETECTED
KLEBSIELLA PNEUMONIAE: NOT DETECTED
Listeria monocytogenes: NOT DETECTED
Neisseria meningitidis: NOT DETECTED
PROTEUS SPECIES: NOT DETECTED
Pseudomonas aeruginosa: NOT DETECTED
STREPTOCOCCUS SPECIES: DETECTED — AB
Serratia marcescens: NOT DETECTED
Staphylococcus aureus (BCID): NOT DETECTED
Staphylococcus species: NOT DETECTED
Streptococcus agalactiae: DETECTED — AB
Streptococcus pneumoniae: NOT DETECTED
Streptococcus pyogenes: NOT DETECTED

## 2016-07-20 LAB — GLUCOSE, CAPILLARY: GLUCOSE-CAPILLARY: 160 mg/dL — AB (ref 65–99)

## 2016-07-20 LAB — BASIC METABOLIC PANEL
Anion gap: 11 (ref 5–15)
BUN: 33 mg/dL — AB (ref 6–20)
CHLORIDE: 107 mmol/L (ref 101–111)
CO2: 21 mmol/L — ABNORMAL LOW (ref 22–32)
Calcium: 8.4 mg/dL — ABNORMAL LOW (ref 8.9–10.3)
Creatinine, Ser: 1.22 mg/dL (ref 0.61–1.24)
GFR calc Af Amer: 58 mL/min — ABNORMAL LOW (ref >60)
GFR calc non Af Amer: 50 mL/min — ABNORMAL LOW (ref >60)
GLUCOSE: 153 mg/dL — AB (ref 65–99)
POTASSIUM: 3.7 mmol/L (ref 3.5–5.1)
Sodium: 139 mmol/L (ref 135–145)

## 2016-07-20 LAB — LACTIC ACID, PLASMA
LACTIC ACID, VENOUS: 5.1 mmol/L — AB (ref 0.5–1.9)
Lactic Acid, Venous: 2.9 mmol/L (ref 0.5–1.9)

## 2016-07-20 LAB — MRSA PCR SCREENING: MRSA by PCR: POSITIVE — AB

## 2016-07-20 LAB — HEPARIN LEVEL (UNFRACTIONATED)

## 2016-07-20 LAB — CORTISOL: Cortisol, Plasma: 52.3 ug/dL

## 2016-07-20 LAB — APTT: APTT: 67 s — AB (ref 24–36)

## 2016-07-20 LAB — MAGNESIUM: Magnesium: 1.5 mg/dL — ABNORMAL LOW (ref 1.7–2.4)

## 2016-07-20 LAB — PHOSPHORUS: PHOSPHORUS: 2.3 mg/dL — AB (ref 2.5–4.6)

## 2016-07-20 MED ORDER — CHLORHEXIDINE GLUCONATE 0.12 % MT SOLN
15.0000 mL | Freq: Two times a day (BID) | OROMUCOSAL | Status: DC
  Administered 2016-07-20 (×2): 15 mL via OROMUCOSAL

## 2016-07-20 MED ORDER — FAMOTIDINE IN NACL 20-0.9 MG/50ML-% IV SOLN
20.0000 mg | INTRAVENOUS | Status: DC
  Administered 2016-07-20 – 2016-07-21 (×2): 20 mg via INTRAVENOUS
  Filled 2016-07-20 (×2): qty 50

## 2016-07-20 MED ORDER — CHLORHEXIDINE GLUCONATE CLOTH 2 % EX PADS
6.0000 | MEDICATED_PAD | Freq: Every day | CUTANEOUS | Status: DC
  Administered 2016-07-21 – 2016-07-24 (×4): 6 via TOPICAL

## 2016-07-20 MED ORDER — MAGNESIUM SULFATE 2 GM/50ML IV SOLN
2.0000 g | Freq: Once | INTRAVENOUS | Status: AC
  Administered 2016-07-20: 2 g via INTRAVENOUS
  Filled 2016-07-20: qty 50

## 2016-07-20 MED ORDER — CHLORHEXIDINE GLUCONATE 0.12 % MT SOLN: 15.0000 mL | Freq: Two times a day (BID) | OROMUCOSAL | Status: DC

## 2016-07-20 MED ORDER — HEPARIN (PORCINE) IN NACL 100-0.45 UNIT/ML-% IJ SOLN
750.0000 [IU]/h | INTRAMUSCULAR | Status: DC
  Administered 2016-07-20: 650 [IU]/h via INTRAVENOUS
  Filled 2016-07-20 (×2): qty 250

## 2016-07-20 MED ORDER — DEXTROSE 5 % IV SOLN
2.0000 g | INTRAVENOUS | Status: DC
  Administered 2016-07-20 – 2016-07-24 (×5): 2 g via INTRAVENOUS
  Filled 2016-07-20 (×6): qty 2

## 2016-07-20 MED ORDER — MUPIROCIN 2 % EX OINT
1.0000 "application " | TOPICAL_OINTMENT | Freq: Two times a day (BID) | CUTANEOUS | Status: DC
  Administered 2016-07-20 – 2016-07-24 (×9): 1 via NASAL
  Filled 2016-07-20 (×2): qty 22

## 2016-07-20 NOTE — Progress Notes (Signed)
Pharmacy Antibiotic Note  Todd [name] is a 82 y.o. male admitted on 07/31/2016 with respiratory distress, originally on empiric treatment with vancomycin and zosyn. Currently afebrile, WBC wnl, LA 2.9 (was 5.1). She is now growing 2/2 GBS in the blood. Current antibiotic regimen can be narrowed to ceftriaxone.  Plan: Ceftriaxone 2g IV q24h  F/u sensitivities, clinical progression, LOT  Height: 5\' 6"  (167.6 cm) Weight: 133 lb 6.1 oz (60.5 kg) IBW/kg (Calculated) : 63.8  Temp (24hrs), Avg:98.5 F (36.9 C), Min:97.3 F (36.3 C), Max:99.9 F (37.7 C)   Recent Labs Lab 07/10/2016 2030 07/20/16 0243 07/20/16 0517 07/20/16 0548  WBC 11.9*  --  4.2  --   CREATININE 1.55*  --  1.22  --   LATICACIDVEN  --  5.1*  --  2.9*    Estimated Creatinine Clearance: 34.4 mL/min (by C-G formula based on SCr of 1.22 mg/dL).    No Known Allergies  Antimicrobials this admission:  9/18 Vanc>>9/19  9/18 zosyn>>9/19 9/19 CTX >>   Microbiology results:  9/18 BCx: 2/2 GPC in chains 9/18 BCID: GBS 9/18 UCx:   9/19 MRSA PCR: Pos  Thank you for allowing pharmacy to be a part of this patient's care.  Dierdre Harness, [place], PharmD Clinical Pharmacy Resident [phone] (Pager) 07/20/2016 12:47 PM

## 2016-07-20 NOTE — Progress Notes (Signed)
eLink Physician-Brief Progress Note Patient Name: Todd [name] DOB: November 21, 1923 MRN: [id number]   Date of Service  07/20/2016  HPI/Events of Note  Pt admitted today with septic shock, pneumonia Stable on camera check, on Bipap LA elevated at 5.1 Pt received IVF and is on neo. Code status is DNR  eICU Interventions  Follow LA.        Tali Coster 07/20/2016, 4:18 AM

## 2016-07-20 NOTE — Progress Notes (Signed)
Patient is from [facility]. CSW now following for disposition and return to SNF if/when medically appropriate and cleared for discharge. Of note, per MD/NP, Family seems to agree with mindset to give Patient another 24 hours of current treatment and gauge his response. If no real improvement, may consider transition to palliative care.          Emiliano Dyer, LCSW [facility] ED/36M Clinical Social Worker [phone]

## 2016-07-20 NOTE — Progress Notes (Signed)
CRITICAL VALUE ALERT  Critical value received: Lactic 5.1  Date of notification:  07/20/16  Time of notification:  0328  Critical value read back:yes  Nurse who received alert: Deboraha Sprang, RN   MD notified (1st page):  Dr. Dorna Bloom  Time of first page:  [phone]

## 2016-07-20 NOTE — Progress Notes (Addendum)
[facility] PCCM PROGRESS NOTE  S: 82 y.o. male admitted 9/18 with septic shock presumed due to aspiration PNA.  Also started on BiPAP for increased WOB. He is off pressors as of this morning. Remains on BiPAP with 50% FiO2 to keep sats around 90%. Family at bedside.  O: BP 106/78   Pulse 97   Temp 97.3 F (36.3 C) (Axillary)   Resp (!) 36   Ht 5\' 6"  (1.676 m)   Wt 60.5 kg (133 lb 6.1 oz)   SpO2 98%   BMI 21.53 kg/m   General:  Elderly frail male in NAD on vent. Very HOH, R ear best.  Neuro:  Alert, opens eyes to command and weakly follows commands ROM 3/5 all extremities.  HEENT:  [place]/AT, PERRL, no JVD Cardiovascular:  Atrial fib on monitor. IRIR.  Lungs: Rhonchi R>L Abdomen:  Soft, non-tender, non-distended Musculoskeletal:  No acute deformity Skin:  Intact, MMM  A/P: Acute hypoxemic respiratory failure secondary to presumed aspiration PNA - Continue BiPAP, titrate FiO2 to keep sats > 90% - If O2 demands improve would like to take BiPAP off intermittently, currently won't tolerate that.  - Pulmonary hygiene - Family seems to agree with mindset that lets give him another 24 hours of current treatment and gauge his response. If no real improvement, may consider transition to palliative care.   Bacteremia Group B strep on BC - Transition ABX to Rocephin - Continue to trend cultures  Septic Shock now off phenylephrine  - Gentle volume - Phenylephrine as needed for MAP > 75mmHg   Georgann Housekeeper, ACNP [place] [facility] Pager [phone] or [phone]  Attending:  I have seen and examined the patient with nurse practitioner/resident and agree with the note above.  We formulated the plan together and I elicited the following history.    Mr. Bjornson is resting more comfortably this morning on BIPAP, but he still has some increased work of breathing  Some crackles right lower lobe BIPAP supported breaths  Acute respiratory failure with hypoxemia due to aspiration  pneumonia vs HCAP > continue BIPAP, continue antibiotics as ordered  I updated the patient's family at length.  We will continue BIPAP overnight.  If he worsens then the plan will be to make him comfortable, they understand.  DNR  Additional CC time by me 30 minutes  Roselie Awkward, MD [place] PCCM Pager: [phone] Cell: [phone] After 3pm or if no response, call [phone]

## 2016-07-20 NOTE — Progress Notes (Signed)
CRITICAL VALUE ALERT  Critical value received:  MRSA PCR positive  Date of notification:  07/20/16  Time of notification:  0607  Critical value read back: yes  Nurse who received alert:  C. Hayes/ Deboraha Sprang, RN  MRSA protocol initiated.

## 2016-07-20 NOTE — Care Management Note (Signed)
Case Management Note  Patient Details  Name: Todd [name] MRN: [id number] Date of Birth: 1924/07/22  Subjective/Objective:   Pt admitted with  has hx of recurrent PNA and UTI.  He was brought to [facility] ED 9/18 in respiratory distress.  Per family, pt vomited earlier in the evening raising concern for aspiration once again.  Action/Plan: PTA residing at [facility].  CSW consulted for discharge disposition  Expected Discharge Date:                  Expected Discharge Plan:  [place]  In-House Referral:  Clinical Social Work  Discharge planning Services  CM Consult  Post Acute Care Choice:    Choice offered to:     DME Arranged:    DME Agency:     HH Arranged:    [place] Agency:     Status of Service:  In process, will continue to follow  If discussed at Long Length of Stay Meetings, dates discussed:    Additional Comments:  Maryclare Labrador, RN 07/20/2016, 10:23 AM

## 2016-07-20 NOTE — Progress Notes (Addendum)
  PHARMACY - PHYSICIAN COMMUNICATION CRITICAL VALUE ALERT - BLOOD CULTURE IDENTIFICATION (BCID)  2/2 Blood cx's with GPC in chains. BCID reported as Group B strep. Lab updates are down for the next several hours so I would assume it will not be updated in the system until later today.  Name of physician (or Provider) Contacted: P. Hoffman  Changes to prescribed antibiotics required: Consider stopping vancomycin and Zosyn and de-escalating to ceftriaxone  Elenor Quinones, PharmD, BCPS Clinical Pharmacist Pager [phone] 07/20/2016 11:12 AM

## 2016-07-20 NOTE — ED Notes (Signed)
Todd [name] notified of delay in lactic acid results d/t not being run in mini lab. New orders placed, EDP notified.

## 2016-07-21 DIAGNOSIS — J189 Pneumonia, unspecified organism: Secondary | ICD-10-CM

## 2016-07-21 DIAGNOSIS — Z7189 Other specified counseling: Secondary | ICD-10-CM

## 2016-07-21 DIAGNOSIS — F0391 Unspecified dementia with behavioral disturbance: Secondary | ICD-10-CM

## 2016-07-21 LAB — APTT: aPTT: 57 seconds — ABNORMAL HIGH (ref 24–36)

## 2016-07-21 LAB — HEPARIN LEVEL (UNFRACTIONATED): HEPARIN UNFRACTIONATED: 0.96 [IU]/mL — AB (ref 0.30–0.70)

## 2016-07-21 LAB — URINE CULTURE: CULTURE: NO GROWTH

## 2016-07-21 MED ORDER — MORPHINE SULFATE (PF) 2 MG/ML IV SOLN
1.0000 mg | INTRAVENOUS | Status: DC | PRN
  Administered 2016-07-21 – 2016-07-24 (×14): 2 mg via INTRAVENOUS
  Filled 2016-07-21 (×14): qty 1

## 2016-07-21 MED ORDER — GLYCOPYRROLATE 0.2 MG/ML IJ SOLN
0.2000 mg | INTRAMUSCULAR | Status: DC | PRN
  Administered 2016-07-21 – 2016-07-24 (×2): 0.2 mg via INTRAVENOUS
  Filled 2016-07-21 (×2): qty 1

## 2016-07-21 MED ORDER — HYDROCORTISONE NA SUCCINATE PF 100 MG IJ SOLR: 50.0000 mg | Freq: Two times a day (BID) | INTRAMUSCULAR | Status: DC

## 2016-07-21 NOTE — Progress Notes (Signed)
Nutrition Brief Note  Chart reviewed. Pt now transitioning to comfort care with plans to transfer to inpatient palliative medicine floor.  No nutrition interventions warranted at this time.  Please consult as needed.   Molli Barrows, RD, LDN, [place] Pager [phone] After Hours Pager [phone]

## 2016-07-21 NOTE — Consult Note (Signed)
Consultation Note Date: 07/21/2016   Patient Name: Todd [name]  DOB: [birth year]  MRN: [id number]  Age / Sex: 82 y.o., male  PCP: Hendricks Limes, MD Referring Physician: Rush Farmer, MD  Reason for Consultation: Establishing goals of care and Hospice Evaluation  HPI/Patient Profile: 82 y.o. male  with past medical history of atrial fibrillation, congestive heart failure, mild aortic insufficiency, colon cancer, COPD, pneumonia, asthma, dementia, failure to thrive, falls, hyperlipidemia, hypertension, and hypothyroidism  admitted on 07/28/2016 with respiratory distress. Per family, patient resides at [facility] and had vomited prior to transfer to the hospital. He has history of recurrent pneumonia and UTI. In ED, patient placed on BiPAP. Hypotensive. CXR with RUL and RML opacities concerning for aspiration pneumonia. Blood culture positive for group B strep. Ceftriaxone started. Palliative Medicine consultation for goals of care and possible hospice.   Clinical Assessment and Goals of Care:  Dr. Hilma Favors and I met with daughter, Todd [name]. She tells [place] that he has been living at [facility] and that he has had pneumonia six times. He does have dementia and has been bedbound at the facility. Todd [name] is a very devoted daughter and visits him every day at the facility. 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 Todd [name] understands that he is very sick and may be nearing end of life, but she is also hopeful that he will recover from this, since he has every other time. She is a very spiritual person and believes her faith and prayer will take this in the right direction. Todd [name] tells a story of a nurse at SNF telling her she heard Mr. Crist talking to himself one night and asking the Re[facility] Cliche to take him and that he was ready. She has accepted it may be time for him  to go to heaven but he is holding on for her.   Todd [name] is familiar with hospice from her mother, whom graduated from [employer] but has since passed away. She also tells [place] that her brother died in 52 from alcoholism. Discussed that we fear patient may be nearing end of life and would qualify for a residential hospice facility. Todd [name] does state concerns of not wanting him to go back to [facility] (He has had some great nurses and some "not so good" nurses). Discussed with her the immense support residential hospice would be for the patient and family, with controlled symptoms, keeping him clean, dry, and comfortable, and safely feeding him comfort foods. Todd [name] does tell us that the patient is financially stable and if possible, she would like for him to be at home but would need 24/7 nursing care. She also agrees with home hospice services.   Todd [name] would like more time to think and discuss with family these disposition options.    HCPOA-daughter, Todd [name]   SUMMARY OF RECOMMENDATIONS    DNR/DNI  Daughter would like him to be comfortable with no heroic measures but requests that  he finish his antibiotic regimen for pneumonia. Currently on IV ceftriaxone. Recommend PO Augmentin during the rest of hospitalization and upon discharge.  Consulted Care management--Daughter wanted 24/7 nursing care at home. Patient has the financial resources to pay for this. Also, home hospice services.   Comfort feeds--ordered dysphagia 1. Do not force feed patient but feed if he is awake and asking. Follow aspiration precautions.   Symptom Management--see below.   PMT will f/u with daughter, Todd [name] tomorrow 9/21 regarding disposition.   Code Status/Advance Care Planning:  DNR   Symptom Management:   Morphine 1-'2mg'$  IV q2h prn pain/dyspnea  Robinul 0.'2mg'$  IV q4h prn secretions  Palliative Prophylaxis:   Aspiration, Delirium Protocol, Frequent Pain Assessment and Turn Reposition  Additional Recommendations  (Limitations, Scope, Preferences):  Full Comfort Care-besides completion of antibiotic for pneumonia  Psycho-social/Spiritual:   Desire for further Chaplaincy support:no  Additional Recommendations: Caregiving  Support/Resources and Education on Hospice  Prognosis:   < 2 weeks-in the setting of recurrent pneumonia, dementia, failure to thrive, and nutritional and functional decline  Discharge Planning: Home with Hospice and 24/7 nursing care OR residential hospice     Primary Diagnoses: Present on Admission: . Septic shock ([place])   I have reviewed the medical record, interviewed the patient and family, and examined the patient. The following aspects are pertinent.  Past Medical History:  Diagnosis Date  . A-fib ([place])   . Asthma   . CAP (community acquired pneumonia)    [name] 02/05/2015  . CHF (congestive heart failure) ([place]) 10/28/2011  . Colon cancer ([place])   . COPD (chronic obstructive pulmonary disease) ([place])   . Dementia    [name] 02/05/2015  . Failure to thrive in adult    /notes 02/05/2015  . Fall 02/04/2015   with altered mental status off of his baseline after he suffered a mechanical fall /notes 02/04/2015  . Forgetfulness   . Hyperlipidemia   . Hypertension   . Hypothyroidism    [name] 02/05/2015  . Mild aortic insufficiency   . PVC's (premature ventricular contractions)   . Shortness of breath    Social History   Social History  . Marital status: Widowed    Spouse name: N/A  . Number of children: 2  . Years of education: 12   Occupational History  . 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 Smokeless tobacco: Never Used     Comment: quit 30+ yrs ago  . Alcohol use No  . Drug use: No  . Sexual activity: No   Other Topics Concern  . None   Social History Narrative   [place] man. 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 Widowed 01-30-2014. Had a son who died at 52. Has a daughter, 2 grandchildren, 2 great-grandchildren. Reports that he was living with his daughter   Family History  Problem Relation Age of Onset  . Hyperlipidemia Father   . Hypertension Father   . Heart disease Father   . Asthma Father   . Stroke Father   . Heart failure Father   . Heart disease Mother   . Heart failure Mother   . Heart attack Sister    Scheduled Meds: . budesonide (PULMICORT) nebulizer solution  0.5 mg Nebulization BID  . cefTRIAXone (ROCEPHIN)  IV  2 g Intravenous Q24H  . Chlorhexidine  Gluconate Cloth  6 each Topical [id number]  . ipratropium-albuterol  3 mL Nebulization Q6H  . mupirocin ointment  1 application Nasal BID   Continuous Infusions:  PRN Meds:.glycopyrrolate, levalbuterol, morphine injection Medications Prior to Admission:  Prior to Admission medications   Medication Sig Start Date End Date Taking? Authorizing Provider  acetaminophen (TYLENOL) 325 MG tablet Take 650 mg by mouth every 6 (six) hours as needed (temp over 99 degrees/pain).    Yes Historical Provider, MD  ADVAIR DISKUS 500-50 MCG/DOSE AEPB INHALE 1 PUFF TWICE A DAY 12/23/14  Yes Donita Brooks, MD  ferrous sulfate 325 (65 FE) MG tablet TAKE 1 TABLET BY MOUTH EVERY DAY 12/23/14  Yes Donita Brooks, MD  fluticasone [facility]) 50 MCG/ACT nasal spray Place 2 sprays into both nostrils daily.   Yes Historical Provider, MD  guaiFENesin 200 MG tablet Take 200 mg by mouth every 4 (four) hours as needed for cough.    Yes Historical Provider, MD  ipratropium-albuterol (DUONEB) 0.5-2.5 (3) MG/3ML SOLN Take 3 mLs by nebulization every 6 (six) hours as needed (shortness of breath/ wheezing).    Yes Historical Provider, MD  levalbuterol (XOPENEX) 0.63 MG/3ML nebulizer solution Take 3 mLs (0.63 mg total) by nebulization every 4 (four) hours as needed for wheezing or shortness of breath. 03/11/15  Yes Joseph Art, DO  levothyroxine (SYNTHROID, LEVOTHROID)  50 MCG tablet Take 50 mcg by mouth daily before breakfast.   Yes Historical Provider, MD  losartan (COZAAR) 50 MG tablet Take 50 mg by mouth daily. 01/23/15  Yes Historical Provider, MD  mirtazapine (REMERON) 15 MG tablet Take 15 mg by mouth at bedtime.   Yes Historical Provider, MD  Nutritional Supplements (NUTRITIONAL SUPPLEMENT PO) Take by mouth 3 (three) times daily. Magic cup (unknown quantity) 10am, 48m, 8pm   Yes Historical Provider, MD  omeprazole (PRILOSEC) 40 MG capsule Take 40 mg by mouth every evening.    Yes Historical Provider, MD  OXYGEN Inhale 2 L into the lungs continuous.   Yes Historical Provider, MD  polyvinyl alcohol (ARTIFICIAL TEARS) 1.4 % ophthalmic solution Place 2 drops into both eyes 4 (four) times daily as needed for dry eyes.   Yes Historical Provider, MD  potassium chloride SA (K-DUR,KLOR-CON) 20 MEQ tablet Take 40 mEq by mouth daily.   Yes Historical Provider, MD  rivaroxaban (XARELTO) 20 MG TABS tablet Take 1 tablet (20 mg total) by mouth daily with supper. Patient taking differently: Take 20 mg by mouth every evening.  03/11/15  Yes Joseph Art, DO  tamsulosin (FLOMAX) 0.4 MG CAPS capsule Take 1 capsule (0.4 mg total) by mouth daily. 03/11/15  Yes Joseph Art, DO  venlafaxine XR (EFFEXOR-XR) 37.5 MG 24 hr capsule Take 75 mg by mouth daily.    Yes Historical Provider, MD   No Known Allergies Review of Systems  Unable to perform ROS: Dementia   Physical Exam  Constitutional: He is easily aroused. He appears ill.  Cardiovascular: An irregularly irregular rhythm present.  Pulmonary/Chest: Effort normal. No accessory muscle usage. No respiratory distress. He has rhonchi.  Abdominal: Soft.  Neurological: He is alert and easily aroused.  Skin: Skin is warm and dry.  Psychiatric: Cognition and memory are impaired.  Nursing note and vitals reviewed.   Vital Signs: BP 137/80   Pulse (!) 48   Temp 97.4 F (36.3 C) (Oral)   Resp (!) 29   Ht 5\' 6"  (1.676 m)    Wt 61 kg (134 lb  7.7 oz)   SpO2 100%   BMI 21.71 kg/m  Pain Assessment: No/denies pain   Pain Score: 0-No pain   SpO2: SpO2: 100 % O2 Device:SpO2: 100 % O2 Flow Rate: .O2 Flow Rate (L/min): 2 L/min  IO: Intake/output summary:   Intake/Output Summary (Last 24 hours) at 07/21/16 1549 Last data filed at 07/21/16 1300  Gross per 24 hour  Intake           1599.4 ml  Output              450 ml  Net           1149.4 ml    LBM: Last BM Date: 07/20/16 Baseline Weight: Weight: 54.4 kg (120 lb) Most recent weight: Weight: 61 kg (134 lb 7.7 oz)     Palliative Assessment/Data: PPS 20%   Flowsheet Rows   Flowsheet Row Most Recent Value  Intake Tab  Referral Department  Critical care  Unit at Time of Referral  ICU  Palliative Care Primary Diagnosis  Pulmonary  Date Notified  07/21/16  Palliative Care Type  New Palliative care  Reason for referral  Clarify Goals of Care, Counsel Regarding Hospice, End of [place]  Date of Admission  07/11/2016  # of days IP prior to Palliative referral  2  Clinical Assessment  Palliative Performance Scale Score  20%  Psychosocial & Spiritual Assessment  Palliative Care Outcomes  Patient/Family meeting held?  Yes  Who was at the meeting?  Daughter  Palliative Care Outcomes  Clarified goals of care, Counseled regarding hospice, Provided advance care planning, Changed to focus on comfort  Patient/Family wishes: Interventions discontinued/not started   Mechanical Ventilation      Time In: 1230 Time Out: 1340 Time Total: 64mn Greater than 50%  of this time was spent counseling and coordinating care related to the above assessment and plan.  Signed by:  MIhor Dow FNP-C Palliative Medicine Team  Phone: [phone]: [phone]  Please contact Palliative Medicine Team phone at [phone] questions and concerns.  For individual provider: See AShea Evans

## 2016-07-21 NOTE — Progress Notes (Signed)
[facility] PCCM  I met with Todd [name] family today and discussed his situation.  His respiratory status has improved overnight but this morning he looks poorly with a death rattle and minimal responsiveness.  I explained to his daughter that he needs full comfort measures given his advanced dementia and recurrent pneumonia episodes.  She states that she is "not ready to give up on him and wants him to receive what he needs medically".  I explained that given his current state the best approach is to focus on his comfort and that his chances of survival are poor. I explained that labwork and preventative measures like a heparin drip provide no medical benefit.  As he has been bedbound for the last year and has had recurrent pneumonia, its clear to me that his prognosis is poor. However I believe his daughter has unrealistic expectations for his care.    Have consulted palliative medicine.  I explained to his daughter today that I am going to transfer Todd [name] to the palliative medicine floor.  If he declines we will administer morphine.  She was agreeable to this strategy but I anticipate she will struggle with this when the time comes.  We will not take him back to the ICU and we will not provide BIPAP or other aggressive measures.  This was explained to her in detail today  Additional cc time discussing end of life with her today 45 minutes  Roselie Awkward, MD [place] PCCM Pager: [phone] Cell: [phone] After 3pm or if no response, call [phone]

## 2016-07-21 NOTE — Progress Notes (Signed)
ANTICOAGULATION CONSULT NOTE - Follow up consult  Pharmacy Consult for Heparin Indication: atrial fibrillation  No Known Allergies  Patient Measurements: Height: 5\' 6"  (167.6 cm) Weight: 134 lb 7.7 oz (61 kg) IBW/kg (Calculated) : 63.8  Vital Signs: Temp: 97.4 F (36.3 C) (09/20 0722) Temp Source: Oral (09/20 0722) BP: 113/49 (09/20 0900) Pulse Rate: 53 (09/20 0900)  Labs:  Recent Labs  07/05/2016 2030 07/20/16 0517 07/21/16 0242 07/21/16 0745  HGB 9.5* 8.5*  --   --   HCT 29.8* 26.1*  --   --   PLT 228 189  --   --   APTT  --  67* 57*  --   HEPARINUNFRC  --  >2.20*  --  0.96*  CREATININE 1.55* 1.22  --   --     Estimated Creatinine Clearance: 34.7 mL/min (by C-G formula based on SCr of 1.22 mg/dL).   Assessment: 82 y.o. male admitted with aspiration PNA/sepsis, h/o Afib and Xarelto on hold, for heparin. Hgb and pltc yesterday lower than admit. No s/sx of bleeding noted. Last dose of Xarelto at 1700 on 9/18. Heparin level and aPTT not correlative, and aPTT subtherapeutic on heparin 650 units/hr.   Goal of Therapy:  APTT 66-102 while Xarelto affecting anti-Xa level  Heparin level 0.3-0.7 Monitor platelets by anticoagulation protocol: Yes   Plan:  Increase heparin to 750 units/hr APTT/CBC in 8 hours Daily HL, aPTT, and CBC  Dierdre Harness, BS, PharmD Clinical Pharmacy Resident [phone] (Pager) 07/21/2016 9:57 AM

## 2016-07-21 NOTE — H&P (Signed)
[facility] PCCM  S: off BIPAP no acute events  O: Vitals:   07/21/16 0600 07/21/16 0722 07/21/16 0733 07/21/16 0735  BP: (!) 121/48     Pulse: (!) 51     Resp: (!) 24     Temp:  97.4 F (36.3 C)    TempSrc:  Oral    SpO2: 98%  100% 100%  Weight:      Height:       4L [place]  On exam: Resting comfortably, rattling with breathing Lungs: rhonchi bilaterally CV: RRR, no mgr GI: BS+, soft Neuro: opens eyes to voice, doesn't follow commands Derm: dry, no breakdown  Blood culture: group B strep  Impression/Plan: GBS bacteremia> continue ceftriaxone, repeat blood culture Sepsis resolved> wean off hydrocortisone Aspiration pneumonia> continue ceftriaxone Dementia, advanced, profound deconditioning> overall prognosis poor, at the point appears to be actively dying, will consult palliative medicine  Transfer to inpatient palliative medicine floor  Roselie Awkward, MD [place] PCCM Pager: [phone] Cell: [phone] After 3pm or if no response, call [phone]

## 2016-07-21 NOTE — Progress Notes (Signed)
Pt's daughter notified about pt's transfer to room 6N26.

## 2016-07-21 NOTE — Care Management Note (Signed)
Case Management Note  Patient Details  Name: Todd [name] MRN: [id number] Date of Birth: 1923-11-13  Subjective/Objective:   Pt admitted with  has hx of recurrent PNA and UTI.  He was brought to [facility] ED 9/18 in respiratory distress.  Per family, pt vomited earlier in the evening raising concern for aspiration once again.  Action/Plan: PTA residing at [facility].  CSW consulted for discharge disposition  Expected Discharge Date:                  Expected Discharge Plan:  [place] [place]  In-House Referral:  Clinical Social Work  Discharge planning Services  CM Consult  Post Acute Care Choice:    Choice offered to:     DME Arranged:    DME Agency:     HH Arranged:    [place] Agency:     Status of Service:  In process, will continue to follow  If discussed at Long Length of Stay Meetings, dates discussed:    Additional Comment 07/21/2016  Daughter called CM back; currently is unsure of safest discharge option.   IF pt discharges home; Will potentially need hospital bed, oxygen if needed.  Already have walker and wheelchairs in the home. ;Daughter understands that hospice will not provide 24 hour nursing -  Daughter requested private duty nursing list.  CM informed daughter that hospice will provide RN for assessment and symptom management.   Daughter informed CM that if pt goes home family will be with him 24 hours a day.  Daughter requested list of hospice providers and private duty nursing list in addition to CSW consult for possible residential hospice placement.  CM contacted Dr Hilma Favors per daughter request - to provide list of private duty agencies and Hospice provider list (CM also provided both list in pts room).   CSW consulted for tentative residential hospice placement.  Daughter will let CM know decision tomorrow regarding discharge disposition.  CM received consult for possible home with hospice with 24 hour care nursing arranged  by hospital per request by  daughter.  CM left voicemail for daughter requesting call back.  CM will also alert CSW of pending residential hospice consult if family is unable to pay for 24 hour nursing care. Maryclare Labrador, RN 07/21/2016, 3:36 PM

## 2016-07-21 NOTE — Progress Notes (Signed)
Met with patient';s daughter, complex grief issues.  Current Goals and Recs:  1. Provide symptom management and treat reversible illness that does not include painful or overly invasive procedures 2. Move out of ICU 3. Complete course of antibiotics will not treat subsequent PNA 4. She is considering hospice options. 5. She has a strong faith. 6. She accepts he is likely at EOL but also hold on to hope that he may have more time, but she is able to state she does not want him to suffer.  Lane Hacker, DO Palliative Medicine

## 2016-07-21 NOTE — Progress Notes (Signed)
Pt transferred from 30M , pt is awake but sleepy, on 2L of O2 via nasal cannula, pt tolerated transport well.

## 2016-07-22 DIAGNOSIS — B951 Streptococcus, group B, as the cause of diseases classified elsewhere: Secondary | ICD-10-CM

## 2016-07-22 DIAGNOSIS — R7881 Bacteremia: Secondary | ICD-10-CM

## 2016-07-22 LAB — CULTURE, BLOOD (ROUTINE X 2)

## 2016-07-22 MED ORDER — MIDAZOLAM HCL 2 MG/2ML IJ SOLN
0.5000 mg | Freq: Once | INTRAMUSCULAR | Status: AC
  Administered 2016-07-22: 0.5 mg via INTRAVENOUS
  Filled 2016-07-22: qty 2

## 2016-07-22 NOTE — Clinical Social Work Note (Signed)
CSW received consult for possible residential hospice placement.  CSW spoke with daughter who is under the impression the patient will be a hospital death, but was agreeable for CSW to start referral process. Choice was offered and daughter chose [facility].  Referral made.    Nonnie Done, MSW, LCSW  [id number]) [phone]  Licensed Clinical Social Worker

## 2016-07-22 NOTE — Progress Notes (Signed)
Daily Progress Note   Patient Name: Todd [name]       Date: 07/22/2016 DOB: 11/24/1923  Age: 82 y.o. MRN#: [id number] Attending Physician: Rush Farmer, MD Primary Care Physician: Unice Cobble, MD Admit Date: 07/26/2016  Reason for Consultation/Follow-up: Establishing goals of care and Hospice Evaluation  Subjective: Patient wakes to voice this afternoon. He is oriented to person and following commands. Denies pain and states he is doing "very very well right now." Spoke with daughter, Todd [name] via telephone. She has been in contact with social work, whom is starting the referral process to [employer]. Todd [name] has also been provided a list of private duty care agencies and home hospice agencies if she decides to take him home instead. She tells me she is still in shock that all this is happening and just taking it one day at a time. It was hard for her to see her dad on this current floor since her brother passed away on 6N a few years ago. Offered support and told Todd [name] PMT will continue to support her father and keep him comfortable.   Length of Stay: 3  Current Medications: Scheduled Meds:  . budesonide (PULMICORT) nebulizer solution  0.5 mg Nebulization BID  . cefTRIAXone (ROCEPHIN)  IV  2 g Intravenous Q24H  . Chlorhexidine Gluconate Cloth  6 each Topical Q0600  . ipratropium-albuterol  3 mL Nebulization Q6H  . mupirocin ointment  1 application Nasal BID    Continuous Infusions:    PRN Meds: glycopyrrolate, levalbuterol, morphine injection  Physical Exam  Constitutional: He is easily aroused.  Cardiovascular: An irregularly irregular rhythm present.  Pulmonary/Chest: No accessory muscle usage. No tachypnea. No respiratory distress. He has decreased breath sounds.  Abdominal:  Soft. Bowel sounds are increased. There is no tenderness.  Neurological: He is alert and easily aroused.  Oriented to person  Skin: Skin is warm and dry.  Bilateral toes cool to touch  Psychiatric: Cognition and memory are impaired.  Nursing note and vitals reviewed.           Vital Signs: BP (!) 166/80 (BP Location: Right Arm)   Pulse 93   Temp 98 F (36.7 C) (Axillary)   Resp 20   Ht 5\' 6"  (1.676 m)   Wt 60.4 kg (133 lb 3.2 oz)   SpO2  96%   BMI 21.50 kg/m  SpO2: SpO2: 96 % O2 Device: O2 Device: Nasal Cannula O2 Flow Rate: O2 Flow Rate (L/min): 2 L/min  Intake/output summary:  Intake/Output Summary (Last 24 hours) at 07/22/16 1321 Last data filed at 07/21/16 1800  Gross per 24 hour  Intake                0 ml  Output              100 ml  Net             -100 ml   LBM: Last BM Date: 07/21/16 Baseline Weight: Weight: 54.4 kg (120 lb) Most recent weight: Weight: 60.4 kg (133 lb 3.2 oz)       Palliative Assessment/Data: PPS 20%   Flowsheet Rows   Flowsheet Row Most Recent Value  Intake Tab  Referral Department  Critical care  Unit at Time of Referral  ICU  Palliative Care Primary Diagnosis  Pulmonary  Date Notified  07/21/16  Palliative Care Type  New Palliative care  Reason for referral  Clarify Goals of Care, Counsel Regarding Hospice, End of [place]  Date of Admission  07/11/2016  # of days IP prior to Palliative referral  2  Clinical Assessment  Palliative Performance Scale Score  20%  Psychosocial & Spiritual Assessment  Palliative Care Outcomes  Patient/Family meeting held?  Yes  Who was at the meeting?  Daughter  Palliative Care Outcomes  Clarified goals of care, Counseled regarding hospice, Provided advance care planning, Changed to focus on comfort  Patient/Family wishes: Interventions discontinued/not started   Mechanical Ventilation      Patient Active Problem List   Diagnosis Date Noted  . Goals of care, counseling/discussion   .  Septic shock ([place]) 07/22/2016  . AKI (acute kidney injury) ([place])   . Essential hypertension 07/13/2016  . Acute and chronic respiratory failure with hypoxia ([place]) 04/25/2016  . Dementia 01/28/2016  . Pressure ulcer 11/28/2015  . Malnutrition of moderate degree 11/02/2015  . Inflammatory arthritis ([place]) 06/14/2015  . Palliative care encounter 04/02/2015  . Dysphagia 04/02/2015  . DNR (do not resuscitate) 04/02/2015  . COPD (chronic obstructive pulmonary disease) ([place]) 03/29/2015  . Anemia, iron deficiency 03/13/2015  . Aspiration pneumonia of right lung ([place]) 03/08/2015  . Acute encephalopathy 03/08/2015  . Protein-calorie malnutrition, severe ([place]) 02/06/2015  . Weakness 02/05/2015  . Failure to thrive in adult 02/05/2015  . Pancreatitis, gallstone   . Elevated LFTs   . Pancreatitis 09/05/2014  . Hypothyroidism 09/05/2014  . Gallstone pancreatitis 11/23/2013  . Chronic diastolic CHF (congestive heart failure) ([place]) 10/28/2011  . Chronic atrial fibrillation ([place]) 09/06/2011  . HCAP (healthcare-associated pneumonia) 03/16/2010  . HLD (hyperlipidemia) 01/24/2008  . ALLERGIC RHINITIS 01/24/2008  . Asthma 01/24/2008  . COLON CANCER 01/24/2008  . CHEST XRAY, ABNORMAL 01/24/2008    Palliative Care Assessment & Plan   Patient Profile: 82 y.o. male  with past medical history of atrial fibrillation, congestive heart failure, mild aortic insufficiency, colon cancer, COPD, pneumonia, asthma, dementia, failure to thrive, falls, hyperlipidemia, hypertension, and hypothyroidism  admitted on 07/29/2016 with respiratory distress. Per family, patient resides at [facility] and had vomited prior to transfer to the hospital. He has history of recurrent pneumonia and UTI. In ED, patient placed on BiPAP. Hypotensive. CXR with RUL and RML opacities concerning for aspiration pneumonia. Blood culture positive for group B strep. Ceftriaxone started. Palliative Medicine consultation for goals of care and  possible hospice.   Assessment: Aspiration pneumonia Dementia Acute hypoxic respiratory failure Group B streptococcus bacteremia Dysphagia  Recommendations/Plan:  SW has made referral to residential hospice-[facility].  Daughter has also been provided list of private duty care agencies and home hospice agencies which she requested yesterday.   Continue antibiotic during hospitalization.  Continue prn morphine for pain/dyspnea.  Robinul as needed for secretions.  Comfort feeds. Needs to be fed. Aspiration precautions.   PMT will continue to follow patient throughout hospitalization.  Code Status: DNR   Code Status Orders        Start     Ordered   07/21/16 0903  Do not attempt resuscitation (DNR)  Continuous    Question Answer Comment  Maintain current active treatments Yes   Do not initiate new interventions Yes      07/21/16 0903    Code Status History    Date Active Date Inactive Code Status Order ID Comments User Context   07/21/2016 11:26 PM 07/21/2016  9:03 AM Partial Code [id number] Vasopressors [place] Rahul Dianna Rossetti, PA-C ED   11/27/2015  5:39 PM 12/04/2015  4:20 PM DNR [id number]  Janece Canterbury, MD Inpatient   11/27/2015 12:38 PM 11/27/2015  5:39 PM DNR [id number]  Sherwood Gambler, MD ED   10/31/2015  3:45 PM 11/04/2015  4:24 PM DNR [id number]  Caren Griffins, MD ED   03/29/2015 11:22 AM 04/05/2015  9:01 PM DNR [id number]  Samella Parr, NP Inpatient   03/08/2015  3:28 PM 03/11/2015  7:25 PM DNR [id number]  Bonnielee Haff, MD Inpatient   02/16/2015  8:28 PM 03/08/2015  3:28 PM DNR [id number]  Hennie Duos, MD Outpatient   02/05/2015  2:07 AM 02/08/2015 11:27 PM DNR [id number]  Deneise Lever, MD ED   02/05/2015  1:43 AM 02/05/2015  2:07 AM Full Code [id number]  Deneise Lever, MD ED   09/06/2014 12:21 AM 09/10/2014  9:52 PM DNR [id number]  Ivor Costa, MD Inpatient   11/23/2013 10:09 PM 11/28/2013  3:37 PM DNR [id number]  Etta Quill, DO ED   09/08/2011  3:32 PM 09/20/2011  7:31 PM DNR  [id number]  Raylene Miyamoto, MD Inpatient       Prognosis:   < 2 weeks-in the setting of recurrent pneumonia, dementia, failure to thrive, and nutritional and functional decline.   Discharge Planning:  To Be Determined-most likely residential hospice unless daughter hires private duty 24/7 nursing care for home.  Care plan was discussed with daughter, Todd [name] via telephone  Thank you for allowing the Palliative Medicine Team to assist in the care of this patient.   Time In: 1300 Time Out: 1325 Total Time 41min Prolonged Time Billed  no       Greater than 50%  of this time was spent counseling and coordinating care related to the above assessment and plan.  Ihor Dow, FNP-C Palliative Medicine Team  Phone: [phone] Fax: [phone]  Please contact Palliative Medicine Team phone at [phone] for questions and concerns.

## 2016-07-22 NOTE — Progress Notes (Signed)
eLink Physician-Brief Progress Note Patient Name: Todd [name] DOB: 1923/12/14 MRN: [id number]   Date of Service  07/22/2016  HPI/Events of Note  Agitation  eICU Interventions  Versed 0.5 mg IV X 1 now.      Intervention Category Minor Interventions: Agitation / anxiety - evaluation and management  Lysle Dingwall 07/22/2016, 9:54 PM

## 2016-07-22 NOTE — Progress Notes (Signed)
PCCM PROGRESS NOTE  Admission date: 07/02/2016 Referring provider: Dr. Sabra Heck, ER  CC: short of breath  Description: 82 yo male from [facility] with dyspnea, diaphoresis, altered mental status, and cool to tough.  This happened after he had episode of vomiting and aspiration.  He was hypotensive and given fluid.  He was started on pressors.  Family agreed to DNR status otherwise.  He was weaned off pressors.  He was transferred to medical floor.  Palliative was consulted.    Subjective: Moans intermittently with frequent coughing spells.  Vital signs: BP (!) 166/80 (BP Location: Right Arm)   Pulse 93   Temp 98 F (36.7 C) (Axillary)   Resp 20   Ht 5\' 6"  (1.676 m)   Wt 133 lb 3.2 oz (60.4 kg)   SpO2 96%   BMI 21.50 kg/m   General: cachectic Neuro: opens eyes, not following commands, difficulty with hearing HEENT: no stridor Cardiac: regular Chest: b/l crackles Abd: soft, non tender Ext: 1+ edema Skin: pressure wounds >> present prior to this admission   CMP Latest Ref Rng & Units 07/20/2016 07/29/2016 05/09/2016  Glucose 65 - 99 mg/dL 153(H) 167(H) -  BUN 6 - 20 mg/dL 33(H) 33(H) 26(A)  Creatinine 0.61 - 1.24 mg/dL 1.22 1.55(H) 0.9  Sodium 135 - 145 mmol/L 139 138 139  Potassium 3.5 - 5.1 mmol/L 3.7 4.4 4.5  Chloride 101 - 111 mmol/L 107 102 -  CO2 22 - 32 mmol/L 21(L) 23 -  Calcium 8.9 - 10.3 mg/dL 8.4(L) 9.0 -  Total Protein 6.5 - 8.1 g/dL - 6.7 -  Total Bilirubin 0.3 - 1.2 mg/dL - 1.3(H) -  Alkaline Phos 38 - 126 U/L - 99 79  AST 15 - 41 U/L - 38 12(A)  ALT 17 - 63 U/L - 18 7(A)    CBC Latest Ref Rng & Units 07/20/2016 07/30/2016 05/09/2016  WBC 4.0 - 10.5 K/uL 4.2 11.9(H) 6.0  Hemoglobin 13.0 - 17.0 g/dL 8.5(L) 9.5(L) 9.2(A)  Hematocrit 39.0 - 52.0 % 26.1(L) 29.8(L) 29(A)  Platelets 150 - 400 K/uL 189 228 288     Assessment: Aspiration pneumonia Acute hypoxic respiratory failure Septic shock Group B Streptococcus bacteremia Acute metabolic  encephalopathy Lactic acidosis Dementia Anemia of critical illness and chronic disease Dysphagia Hx of HTN, HLD, A fib Hx of Hypothyroidism Hx of COPD Hx of Colon cancer Hx of asthma and bronchiectasis  Plan: Continue medical therapies No escalation of care DNR/DNI F/u with palliative care team Prn morphine, robinul  Updated pt's daughter at bedside.  Chesley Mires, MD [facility] Pulmonary/Critical Care 07/22/2016, 11:22 AM Pager:  [phone] After 3pm call: [phone]

## 2016-07-23 DIAGNOSIS — F411 Generalized anxiety disorder: Secondary | ICD-10-CM

## 2016-07-23 MED ORDER — WHITE PETROLATUM GEL
Status: AC
  Administered 2016-07-23: 18:00:00
  Filled 2016-07-23: qty 1

## 2016-07-23 MED ORDER — IPRATROPIUM-ALBUTEROL 0.5-2.5 (3) MG/3ML IN SOLN
3.0000 mL | Freq: Two times a day (BID) | RESPIRATORY_TRACT | Status: DC
  Administered 2016-07-24: 3 mL via RESPIRATORY_TRACT
  Filled 2016-07-23: qty 3

## 2016-07-23 MED ORDER — MIDAZOLAM HCL 2 MG/2ML IJ SOLN
0.5000 mg | INTRAMUSCULAR | Status: DC | PRN
  Administered 2016-07-23 (×3): 0.5 mg via INTRAVENOUS
  Filled 2016-07-23 (×4): qty 2

## 2016-07-23 MED ORDER — SODIUM CHLORIDE 0.9 % IV SOLN
2.0000 mg/h | INTRAVENOUS | Status: DC
  Administered 2016-07-23 – 2016-07-24 (×2): 2 mg/h via INTRAVENOUS
  Filled 2016-07-23: qty 10

## 2016-07-23 NOTE — Clinical Social Work Note (Signed)
CSW was informed that patient was not stable enough for transfer to [facility] today.  Chief Operating Officer made aware and agreeable to follow for possible dc tomorrow (pending stability and bed availability).  Weekend CSW to follow-up and assist.

## 2016-07-23 NOTE — Progress Notes (Signed)
PCCM PROGRESS NOTE  Admission date: 07/09/2016 Referring provider: Dr. Sabra Heck, ER  CC: short of breath  Description: 82 yo male from [name] with dyspnea, diaphoresis, altered mental status, and cool to tough.  This happened after he had episode of vomiting and aspiration.  He was hypotensive and given fluid.  He was started on pressors.  Family agreed to DNR status otherwise.  He was weaned off pressors.  He was transferred to medical floor.  Palliative was consulted.    Subjective: Received versed, and morphine overnight.  Vital signs: BP (!) 143/67 (BP Location: Right Arm)   Pulse 96   Temp 98.3 F (36.8 C) (Oral)   Resp 20   Ht 5\' 6"  (1.676 m)   Wt 133 lb 3.2 oz (60.4 kg)   SpO2 93%   BMI 21.50 kg/m   General: cachectic Neuro: obtunded HEENT: no stridor Cardiac: regular Chest: b/l crackles Abd: soft, non tender Ext: 1+ edema Skin: pressure wounds >> present prior to this admission   CMP Latest Ref Rng & Units 07/20/2016 07/16/2016 05/09/2016  Glucose 65 - 99 mg/dL 153(H) 167(H) -  BUN 6 - 20 mg/dL 33(H) 33(H) 26(A)  Creatinine 0.61 - 1.24 mg/dL 1.22 1.55(H) 0.9  Sodium 135 - 145 mmol/L 139 138 139  Potassium 3.5 - 5.1 mmol/L 3.7 4.4 4.5  Chloride 101 - 111 mmol/L 107 102 -  CO2 22 - 32 mmol/L 21(L) 23 -  Calcium 8.9 - 10.3 mg/dL 8.4(L) 9.0 -  Total Protein 6.5 - 8.1 g/dL - 6.7 -  Total Bilirubin 0.3 - 1.2 mg/dL - 1.3(H) -  Alkaline Phos 38 - 126 U/L - 99 79  AST 15 - 41 U/L - 38 12(A)  ALT 17 - 63 U/L - 18 7(A)    CBC Latest Ref Rng & Units 07/20/2016 07/10/2016 05/09/2016  WBC 4.0 - 10.5 K/uL 4.2 11.9(H) 6.0  Hemoglobin 13.0 - 17.0 g/dL 8.5(L) 9.5(L) 9.2(A)  Hematocrit 39.0 - 52.0 % 26.1(L) 29.8(L) 29(A)  Platelets 150 - 400 K/uL 189 228 288     Assessment: Aspiration pneumonia Acute hypoxic respiratory failure Septic shock Group B Streptococcus bacteremia Acute metabolic encephalopathy Lactic acidosis Dementia Anemia of critical illness and  chronic disease Dysphagia Hx of HTN, HLD, A fib Hx of Hypothyroidism Hx of COPD Hx of Colon cancer Hx of asthma and bronchiectasis  Plan: Continue medical therapies per pt's daughter  No escalation of care DNR/DNI F/u with palliative care team >> not sure if he is stable for transfer to [facility] place, and suspect he will have death in hospital Prn morphine, robinul   Chesley Mires, MD [place] 07/23/2016, 9:49 AM Pager:  [phone] After 3pm call: [phone]

## 2016-07-23 NOTE — Progress Notes (Addendum)
Daily Progress Note   Patient Name: Todd [name]       Date: 07/23/2016 DOB: 1924-06-14  Age: 82 y.o. MRN#: [id number] Attending Physician: Todd Farmer, MD Primary Care Physician: Todd Cobble, MD Admit Date: 07/29/2016  Reason for Consultation/Follow-up: Establishing goals of care and Hospice Evaluation  Subjective: Patient comfortable and resting upon arrival to room. Will open eyes to voice with few moans. Spoke with RN. Patient was very agitated through the night. Required frequent prn morphine and one time dose of Versed. No family at bedside. [place] referral.   Length of Stay: 4  Current Medications: Scheduled Meds:  . budesonide (PULMICORT) nebulizer solution  0.5 mg Nebulization BID  . cefTRIAXone (ROCEPHIN)  IV  2 g Intravenous Q24H  . Chlorhexidine Gluconate Cloth  6 each Topical Q0600  . ipratropium-albuterol  3 mL Nebulization Q6H  . mupirocin ointment  1 application Nasal BID    Continuous Infusions:    PRN Meds: glycopyrrolate, levalbuterol, midazolam, morphine injection  Physical Exam  Constitutional: He is easily aroused.  Eyes: Pupils are equal, round, and reactive to light.  Cardiovascular: An irregular rhythm present.  Pulmonary/Chest: No accessory muscle usage. No tachypnea. No respiratory distress. He has decreased breath sounds.  Abdominal: Soft. Bowel sounds are normal.  Neurological: He is easily aroused. He is disoriented.  Skin: Skin is warm and dry.  Cool toes and BLE. No mottling  Psychiatric: Cognition and memory are impaired. He is inattentive.  Nursing note and vitals reviewed.           Vital Signs: BP (!) 143/67 (BP Location: Right Arm)   Pulse 96   Temp 98.3 F (36.8 C) (Oral)   Resp 20   Ht 5\' 6"  (1.676 m)   Wt 60.4 kg  (133 lb 3.2 oz)   SpO2 93%   BMI 21.50 kg/m  SpO2: SpO2: 93 % O2 Device: O2 Device: Nasal Cannula O2 Flow Rate: O2 Flow Rate (L/min): 2.5 L/min  Intake/output summary:  Intake/Output Summary (Last 24 hours) at 07/23/16 1249 Last data filed at 07/23/16 1031  Gross per 24 hour  Intake               60 ml  Output                0  ml  Net               60 ml   LBM: Last BM Date: 07/21/16 Baseline Weight: Weight: 54.4 kg (120 lb) Most recent weight: Weight: 60.4 kg (133 lb 3.2 oz)       Palliative Assessment/Data: PPS 20%   Flowsheet Rows   Flowsheet Row Most Recent Value  Intake Tab  Referral Department  Critical care  Unit at Time of Referral  ICU  Palliative Care Primary Diagnosis  Pulmonary  Date Notified  07/21/16  Palliative Care Type  New Palliative care  Reason for referral  Clarify Goals of Care, Counsel Regarding Hospice, End of [place]  Date of Admission  07/08/2016  # of days IP prior to Palliative referral  2  Clinical Assessment  Palliative Performance Scale Score  20%  Psychosocial & Spiritual Assessment  Palliative Care Outcomes  Patient/Family meeting held?  Yes  Who was at the meeting?  Daughter  Palliative Care Outcomes  Clarified goals of care, Counseled regarding hospice, Provided advance care planning, Changed to focus on comfort  Patient/Family wishes: Interventions discontinued/not started   Mechanical Ventilation      Patient Active Problem List   Diagnosis Date Noted  . Goals of care, counseling/discussion   . Septic shock ([place]) 07/04/2016  . AKI (acute kidney injury) ([place])   . Essential hypertension 07/13/2016  . Acute and chronic respiratory failure with hypoxia ([place]) 04/25/2016  . Dementia 01/28/2016  . Pressure ulcer 11/28/2015  . Malnutrition of moderate degree 11/02/2015  . Inflammatory arthritis ([place]) 06/14/2015  . Palliative care encounter 04/02/2015  . Dysphagia 04/02/2015  . DNR (do not resuscitate) 04/02/2015  .  COPD (chronic obstructive pulmonary disease) ([place]) 03/29/2015  . Anemia, iron deficiency 03/13/2015  . Aspiration pneumonia of right lung ([place]) 03/08/2015  . Acute encephalopathy 03/08/2015  . Protein-calorie malnutrition, severe ([place]) 02/06/2015  . Weakness 02/05/2015  . Failure to thrive in adult 02/05/2015  . Pancreatitis, gallstone   . Elevated LFTs   . Pancreatitis 09/05/2014  . Hypothyroidism 09/05/2014  . Gallstone pancreatitis 11/23/2013  . Chronic diastolic CHF (congestive heart failure) ([place]) 10/28/2011  . Chronic atrial fibrillation ([place]) 09/06/2011  . HCAP (healthcare-associated pneumonia) 03/16/2010  . HLD (hyperlipidemia) 01/24/2008  . ALLERGIC RHINITIS 01/24/2008  . Asthma 01/24/2008  . COLON CANCER 01/24/2008  . CHEST XRAY, ABNORMAL 01/24/2008    Palliative Care Assessment & Plan   Patient Profile: 82 y.o.malewith past medical history of atrial fibrillation, congestive heart failure, mild aortic insufficiency, colon cancer, COPD, pneumonia, asthma, dementia, failure to thrive, falls, hyperlipidemia, hypertension, and hypothyroidismadmitted on 9/18/2017with respiratory distress. Per family, patient resides at [facility] and had vomited prior to transfer to the hospital. He has history of recurrent pneumonia and UTI. In ED, patient placed on BiPAP. Hypotensive. CXR with RUL and RML opacities concerning for aspiration pneumonia. Blood culture positive for group B strep. Ceftriaxone started. Palliative Medicine consultation for goals of care and possible hospice.   Assessment: Aspiration pneumonia Dementia Acute hypoxic respiratory failure Group B streptococcus bacteremia Dysphagia  Recommendations/Plan:  SW has made referral to residential hospice-[facility]  Continue Morphing 1-2mg  IV q2h prn pain/dyspnea/air hunger  Versed 0.5mg  IV q2h prn agitation  Robinul 0.2mg  IV q4h prn secretions  Will initiate continuous morphine infusion if several prn  morphine and versed doses are required throughout the day.   Patient is not stable today to move to [facility]. ? Hospital death.   Goals of  Care and Additional Recommendations:  Limitations on Scope of Treatment: Full Comfort Care  Code Status: DNR   Code Status Orders        Start     Ordered   07/21/16 0903  Do not attempt resuscitation (DNR)  Continuous    Question Answer Comment  Maintain current active treatments Yes   Do not initiate new interventions Yes      07/21/16 0903    Code Status History    Date Active Date Inactive Code Status Order ID Comments User Context   07/03/2016 11:26 PM 07/21/2016  9:03 AM Partial Code [id number] Vasopressors [place] Rahul Dianna Rossetti, PA-C ED   11/27/2015  5:39 PM 12/04/2015  4:20 PM DNR [id number]  Janece Canterbury, MD Inpatient   11/27/2015 12:38 PM 11/27/2015  5:39 PM DNR [id number]  Sherwood Gambler, MD ED   10/31/2015  3:45 PM 11/04/2015  4:24 PM DNR [id number]  Caren Griffins, MD ED   03/29/2015 11:22 AM 04/05/2015  9:01 PM DNR [id number]  Samella Parr, NP Inpatient   03/08/2015  3:28 PM 03/11/2015  7:25 PM DNR [id number]  Bonnielee Haff, MD Inpatient   02/16/2015  8:28 PM 03/08/2015  3:28 PM DNR [id number]  Hennie Duos, MD Outpatient   02/05/2015  2:07 AM 02/08/2015 11:27 PM DNR [id number]  Deneise Lever, MD ED   02/05/2015  1:43 AM 02/05/2015  2:07 AM Full Code [id number]  Deneise Lever, MD ED   09/06/2014 12:21 AM 09/10/2014  9:52 PM DNR [id number]  Ivor Costa, MD Inpatient   11/23/2013 10:09 PM 11/28/2013  3:37 PM DNR [id number]  Etta Quill, DO ED   09/08/2011  3:32 PM 09/20/2011  7:31 PM DNR [id number]  Raylene Miyamoto, MD Inpatient       Prognosis:   Hours - Days-recurrent pneumonia, dementia, failure to thrive, severe nutritional and functional status decline.   Discharge Planning:  Anticipated Hospital Death or [place] was discussed with Dr. Rowe Pavy, Dr. Halford Chessman, and RN  Thank you for allowing the Palliative Medicine Team to  assist in the care of this patient.   Time In: 1015 Time Out: 1040 Total Time 56min Prolonged Time Billed  no       Greater than 50%  of this time was spent counseling and coordinating care related to the above assessment and plan.  Ihor Dow, FNP-C Palliative Medicine Team  Phone: [phone] Fax: [phone]   Loistine Chance MD [place] palliative medicine team [phone]   Agree with above note. Patient seen and examined.  D/W Dr Halford Chessman.  Please contact Palliative Medicine Team phone at [phone] for questions and concerns.

## 2016-07-23 NOTE — Progress Notes (Signed)
1800 Pt is moaning more this evening, had 4 doses of morphine IV today. Dr Rowe Pavy notified, will start pt on Morphine drip.

## 2016-07-24 DIAGNOSIS — Z515 Encounter for palliative care: Secondary | ICD-10-CM

## 2016-07-24 DIAGNOSIS — Z189 Retained foreign body fragments, unspecified material: Secondary | ICD-10-CM

## 2016-07-24 MED ORDER — ACETAMINOPHEN 650 MG RE SUPP: 650.0000 mg | RECTAL | Status: DC | PRN

## 2016-07-24 NOTE — Progress Notes (Signed)
Daily Progress Note   Patient Name: Todd [name]       Date: 07/24/2016 DOB: [birth year]  Age: 82 y.o. MRN#: [id number] Attending Physician: Rush Farmer, MD Primary Care Physician: Unice Cobble, MD Admit Date: 07/31/2016  Reason for Consultation/Follow-up: Establishing goals of care and Hospice Evaluation  Subjective: Patient comfortable and resting upon arrival to room. Will open eyes to voice with few moans. Spoke with RN.   Continue morphine drip Hospice transfer if lingers   Length of Stay: 5  Current Medications: Scheduled Meds:  . budesonide (PULMICORT) nebulizer solution  0.5 mg Nebulization BID  . cefTRIAXone (ROCEPHIN)  IV  2 g Intravenous Q24H  . Chlorhexidine Gluconate Cloth  6 each Topical Q0600  . ipratropium-albuterol  3 mL Nebulization BID  . mupirocin ointment  1 application Nasal BID    Continuous Infusions: . morphine 2 mg/hr (07/23/16 1904)    PRN Meds: glycopyrrolate, levalbuterol, midazolam, morphine injection  Physical Exam  Constitutional: He is easily aroused.  Eyes: Pupils are equal, round, and reactive to light.  Cardiovascular: An irregular rhythm present.  Pulmonary/Chest: No accessory muscle usage. No tachypnea. No respiratory distress. He has decreased breath sounds.  Abdominal: Soft. Bowel sounds are normal.  Neurological: He is easily aroused. He is disoriented.  Skin: Skin is warm and dry.  Cool toes and BLE. No mottling  Psychiatric: Cognition and memory are impaired. He is inattentive.  Nursing note and vitals reviewed.           Vital Signs: BP (!) 148/66 (BP Location: Right Arm)   Pulse 82   Temp 99.5 F (37.5 C) (Oral)   Resp 20   Ht 5\' 6"  (1.676 m)   Wt 60.4 kg (133 lb 3.2 oz)   SpO2 (!) 82%   BMI 21.50 kg/m  SpO2:  SpO2: (!) 82 % O2 Device: O2 Device: Nasal Cannula O2 Flow Rate: O2 Flow Rate (L/min): 2 L/min  Intake/output summary:   Intake/Output Summary (Last 24 hours) at 07/24/16 1059 Last data filed at 07/24/16 1053  Gross per 24 hour  Intake             71.6 ml  Output              100 ml  Net            -  28.4 ml   LBM: Last BM Date: 07/21/16 Baseline Weight: Weight: 54.4 kg (120 lb) Most recent weight: Weight: 60.4 kg (133 lb 3.2 oz)       Palliative Assessment/Data: PPS 20%   Flowsheet Rows   Flowsheet Row Most Recent Value  Intake Tab  Referral Department  Critical care  Unit at Time of Referral  ICU  Palliative Care Primary Diagnosis  Pulmonary  Date Notified  07/21/16  Palliative Care Type  New Palliative care  Reason for referral  Clarify Goals of Care, Counsel Regarding Hospice, End of [place]  Date of Admission  07/12/2016  # of days IP prior to Palliative referral  2  Clinical Assessment  Palliative Performance Scale Score  20%  Psychosocial & Spiritual Assessment  Palliative Care Outcomes  Patient/Family meeting held?  Yes  Who was at the meeting?  Daughter  Palliative Care Outcomes  Clarified goals of care, Counseled regarding hospice, Provided advance care planning, Changed to focus on comfort  Patient/Family wishes: Interventions discontinued/not started   Mechanical Ventilation      Patient Active Problem List   Diagnosis Date Noted  . Anxiety state   . Goals of care, counseling/discussion   . Septic shock ([place]) 07/11/2016  . AKI (acute kidney injury) ([place])   . Essential hypertension 07/13/2016  . Acute and chronic respiratory failure with hypoxia ([place]) 04/25/2016  . Dementia 01/28/2016  . Pressure ulcer 11/28/2015  . Malnutrition of moderate degree 11/02/2015  . Inflammatory arthritis ([place]) 06/14/2015  . Palliative care encounter 04/02/2015  . Dysphagia 04/02/2015  . DNR (do not resuscitate) 04/02/2015  . COPD (chronic obstructive  pulmonary disease) ([place]) 03/29/2015  . Anemia, iron deficiency 03/13/2015  . Aspiration pneumonia of right lung ([place]) 03/08/2015  . Acute encephalopathy 03/08/2015  . Protein-calorie malnutrition, severe ([place]) 02/06/2015  . Weakness 02/05/2015  . Failure to thrive in adult 02/05/2015  . Pancreatitis, gallstone   . Elevated LFTs   . Pancreatitis 09/05/2014  . Hypothyroidism 09/05/2014  . Gallstone pancreatitis 11/23/2013  . Chronic diastolic CHF (congestive heart failure) ([place]) 10/28/2011  . Chronic atrial fibrillation ([place]) 09/06/2011  . HCAP (healthcare-associated pneumonia) 03/16/2010  . HLD (hyperlipidemia) 01/24/2008  . ALLERGIC RHINITIS 01/24/2008  . Asthma 01/24/2008  . COLON CANCER 01/24/2008  . CHEST XRAY, ABNORMAL 01/24/2008    Palliative Care Assessment & Plan   Patient Profile: 82 y.o.malewith past medical history of atrial fibrillation, congestive heart failure, mild aortic insufficiency, colon cancer, COPD, pneumonia, asthma, dementia, failure to thrive, falls, hyperlipidemia, hypertension, and hypothyroidismadmitted on 9/18/2017with respiratory distress. Per family, patient resides at [facility] and had vomited prior to transfer to the hospital. He has history of recurrent pneumonia and UTI. In ED, patient placed on BiPAP. Hypotensive. CXR with RUL and RML opacities concerning for aspiration pneumonia. Blood culture positive for group B strep. Ceftriaxone started. Palliative Medicine consultation for goals of care and possible hospice.   Assessment: Aspiration pneumonia Dementia Acute hypoxic respiratory failure Group B streptococcus bacteremia Dysphagia  Recommendations/Plan:  Morphine drip at 2 mg/hour to continue. Also has bolus doses available if needed  Versed 0.5mg  IV q2h prn agitation  Robinul 0.2mg  IV q4h prn secretions Anticipate hospital death, prognosis ?hours to days  Goals of Care and Additional Recommendations:  Limitations on Scope of  Treatment: Full Comfort Care  Code Status: DNR   Code Status Orders        Start     Ordered   07/21/16 0903  Do not  attempt resuscitation (DNR)  Continuous    Question Answer Comment  Maintain current active treatments Yes   Do not initiate new interventions Yes      07/21/16 0903    Code Status History    Date Active Date Inactive Code Status Order ID Comments User Context   07/21/2016 11:26 PM 07/21/2016  9:03 AM Partial Code [id number] Vasopressors OK Rahul Dianna Rossetti, PA-C ED   11/27/2015  5:39 PM 12/04/2015  4:20 PM DNR [id number]  Janece Canterbury, MD Inpatient   11/27/2015 12:38 PM 11/27/2015  5:39 PM DNR [id number]  Sherwood Gambler, MD ED   10/31/2015  3:45 PM 11/04/2015  4:24 PM DNR [id number]  Caren Griffins, MD ED   03/29/2015 11:22 AM 04/05/2015  9:01 PM DNR [id number]  Samella Parr, NP Inpatient   03/08/2015  3:28 PM 03/11/2015  7:25 PM DNR [id number]  Bonnielee Haff, MD Inpatient   02/16/2015  8:28 PM 03/08/2015  3:28 PM DNR [id number]  Hennie Duos, MD Outpatient   02/05/2015  2:07 AM 02/08/2015 11:27 PM DNR [id number]  Deneise Lever, MD ED   02/05/2015  1:43 AM 02/05/2015  2:07 AM Full Code [id number]  Deneise Lever, MD ED   09/06/2014 12:21 AM 09/10/2014  9:52 PM DNR [id number]  Ivor Costa, MD Inpatient   11/23/2013 10:09 PM 11/28/2013  3:37 PM DNR [id number]  Etta Quill, DO ED   09/08/2011  3:32 PM 09/20/2011  7:31 PM DNR [id number]  Raylene Miyamoto, MD Inpatient       Prognosis:   Hours - Days-recurrent pneumonia, dementia, failure to thrive, severe nutritional and functional status decline.   Discharge Planning:  Anticipated Hospital Death or [place] was discussed with   RN  Thank you for allowing the Palliative Medicine Team to assist in the care of this patient.   Time In: 1015 Time Out: 1040 Total Time 18min Prolonged Time Billed  no       Greater than 50%  of this time was spent counseling and coordinating care related to the above assessment and  plan.   Loistine Chance MD [place] palliative medicine team [phone]   Please contact Palliative Medicine Team phone at [phone] for questions and concerns.

## 2016-07-24 NOTE — Progress Notes (Signed)
PCCM PROGRESS NOTE  Admission date: 07/17/2016 Referring provider: Dr. Sabra Heck, ER  CC: short of breath  Description: 82 yo male from [facility] with dyspnea, diaphoresis, altered mental status, and cool to tough.  This happened after he had episode of vomiting and aspiration.  He was hypotensive and given fluid.  He was started on pressors.  Family agreed to DNR status otherwise.  He was weaned off pressors.  He was transferred to medical floor.  Palliative was consulted.    Events 9/22 - Received versed, and morphine overnight.   SUBJECTIVE/OVERNIGHT/INTERVAL HX 07/24/16 - moans and groans and mostly unresponsive. ? Early stages of death rattle. Not making much urine per RN. Deeply sedated. Febrile and PEr RN - family member x 1 wants this palliated  Vital signs: BP (!) 120/42 (BP Location: Right Arm)   Pulse (!) 113   Temp (!) 101.6 F (38.7 C) (Axillary)   Resp 20   Ht 5\' 6"  (1.676 m)   Wt 60.4 kg (133 lb 3.2 oz)   SpO2 (!) 76%   BMI 21.50 kg/m   General: cachectic Neuro: obtunded HEENT: no stridor Cardiac: regular Chest: b/l crackles Abd: soft, non tender Ext: 1+ edema Skin: pressure wounds >> present prior to this admission   CMP Latest Ref Rng & Units 07/20/2016 07/09/2016 05/09/2016  Glucose 65 - 99 mg/dL 153(H) 167(H) -  BUN 6 - 20 mg/dL 33(H) 33(H) 26(A)  Creatinine 0.61 - 1.24 mg/dL 1.22 1.55(H) 0.9  Sodium 135 - 145 mmol/L 139 138 139  Potassium 3.5 - 5.1 mmol/L 3.7 4.4 4.5  Chloride 101 - 111 mmol/L 107 102 -  CO2 22 - 32 mmol/L 21(L) 23 -  Calcium 8.9 - 10.3 mg/dL 8.4(L) 9.0 -  Total Protein 6.5 - 8.1 g/dL - 6.7 -  Total Bilirubin 0.3 - 1.2 mg/dL - 1.3(H) -  Alkaline Phos 38 - 126 U/L - 99 79  AST 15 - 41 U/L - 38 12(A)  ALT 17 - 63 U/L - 18 7(A)    CBC Latest Ref Rng & Units 07/20/2016 07/26/2016 05/09/2016  WBC 4.0 - 10.5 K/uL 4.2 11.9(H) 6.0  Hemoglobin 13.0 - 17.0 g/dL 8.5(L) 9.5(L) 9.2(A)  Hematocrit 39.0 - 52.0 % 26.1(L) 29.8(L) 29(A)  Platelets  150 - 400 K/uL 189 228 288     Assessment: Aspiration pneumonia Acute hypoxic respiratory failure Septic shock Group B Streptococcus bacteremia Acute metabolic encephalopathy Lactic acidosis Dementia Anemia of critical illness and chronic disease Dysphagia Hx of HTN, HLD, A fib Hx of Hypothyroidism Hx of COPD Hx of Colon cancer Hx of asthma and bronchiectasis  - full comfort care. Prognosis appears hours to days  Plan: No escalation of care DNR/DNI [place] gtt Add tylenol for fever Dc antibiotic - d/w RN    Dr. Brand Males, M.D., [facility].C.P Pulmonary and Critical Care Medicine Staff Physician [place] [facility] Pager: [phone], If no answer or between  15:00h - 7:00h: call [phone]  07/24/2016 3:32 PM      /

## 2016-07-27 ENCOUNTER — Telehealth: Payer: Self-pay

## 2016-07-27 NOTE — Telephone Encounter (Signed)
On 07/27/2016 I received a death certificate from [place] (original). The death certificate is for burial. The patient is a patient of Doctor Ramaswamy. The death certificate will be taken to [facility] ([address]) this pm for signature.  On August 18, 2016 I received the death certificate back from Doctor Ramaswamy. I got the death certificate ready and called the funeral home to let them know the death certificate is ready for pickup.

## 2016-08-01 NOTE — Progress Notes (Signed)
Nurse Tech called this RN to room. Upon entering room patient found breathless and pulseless, second RN Alena Bills pronounced patient deceased. MD called, family notified. Donor referral service notified, Guerry Bruin, case [id number].

## 2016-08-01 DEATH — deceased

## 2016-09-01 NOTE — Discharge Summary (Signed)
DISCHARGE SUMMARY    Date of admit: 07/11/2016  8:38 PM Date of discharge: 2016-08-19  5:30 AM Length of Stay: 6 days  PCP is Unice Cobble, MD   PROBLEM LIST Active Problems:   Septic shock ([place]) due to Group B Strep Bacteremia and Aspiration Pneumonia   Goals of care, counseling/discussion   Anxiety state   Terminal care Advanced dementia    SUMMARY Todd [name] was 82 y.o. patient with    has a past medical history of A-fib ([place]); Asthma; CAP (community acquired pneumonia); CHF (congestive heart failure) ([place]) (10/28/2011); Colon cancer ([place]); COPD (chronic obstructive pulmonary disease) ([place]); Dementia; Failure to thrive in adult; Fall (02/04/2015); Forgetfulness; Hyperlipidemia; Hypertension; Hypothyroidism; Mild aortic insufficiency; PVC's (premature ventricular contractions); and Shortness of breath.   has a past surgical history that includes Colon surgery and Central line insertion (09/08/2011).   Admitted on 07/07/2016 with Todd [name] is a 82 y.o. male with PMH as outlined above and who resides at [facility].  He has hx of recurrent PNA and UTI.  He was [facility] to [facility] ED 9/18 in respiratory distress.  Per family, pt vomited earlier in the evening raising concern for aspiration once again. In ED, he was started on BiPAP and was found to be profoundly hypotensive with initial SBP in 60's.  He was given 69ml/kg bolus; however, BP did not respond. CXR demonstrates RUL and RML opacities concerning for aspiration PNA.  After chart review, pt had admission in January and at the time, was seen by palliative care to establish goals of care.  Decision was made to list him as full DNR at the time.  On this admission, pt was started on levophed while in ED for shock, suspect septic from presumed aspiration PNA.  I personally called pt's daughter [name] to clarify pt's wishes / advanced directives.  After extensive discussion, she confirmed that pt would not want to  undergo any heroic measures including no CPR / defibrillations and / or mechanical ventilation.  She is comfortable with and requests that we continue vasopressors; however, would like to avoid CVL placement.  We will therefore switch to phenylephrine and will hope to avoid CVL placement.   EVENTS 07/27/2016 - admit. Started on BiPAP and pressors 07/20/16  -  Off pressors 07/21/16 - Palliative goals of care meet - DNR, DNI, Continue antibiotic + concurrent comfort care. Move to palliative floor 07/22/16 - 07/24/16 - comfort status maintained  08-19-16 - expired    SIGNED Dr. Brand Males, M.D., [facility].C.P Pulmonary and Critical Care Medicine Staff Physician [place] [facility] Pager: [phone], If no answer or between  15:00h - 7:00h: call [phone]  08/12/2016 4:51 AM

## 2017-03-29 IMAGING — CR DG CHEST 1V PORT
1 series · 1 of 1 positions shown · non-contrast
Comparison: 02/07/2015

CLINICAL DATA: Code sepsis.Per ED note: Arrives from [REDACTED] via
EMS; sent for altered mental status starting yesterday. Usually
alert and talks; resting with eyes closed and groans to stimulation.
[REDACTED] suspected UTI yesterday and gave Rocephin yesterdayH/o
COPD, CHF, HTN, a-fib, asthma, dementia.

EXAM:
PORTABLE CHEST - 1 VIEW

[AP]
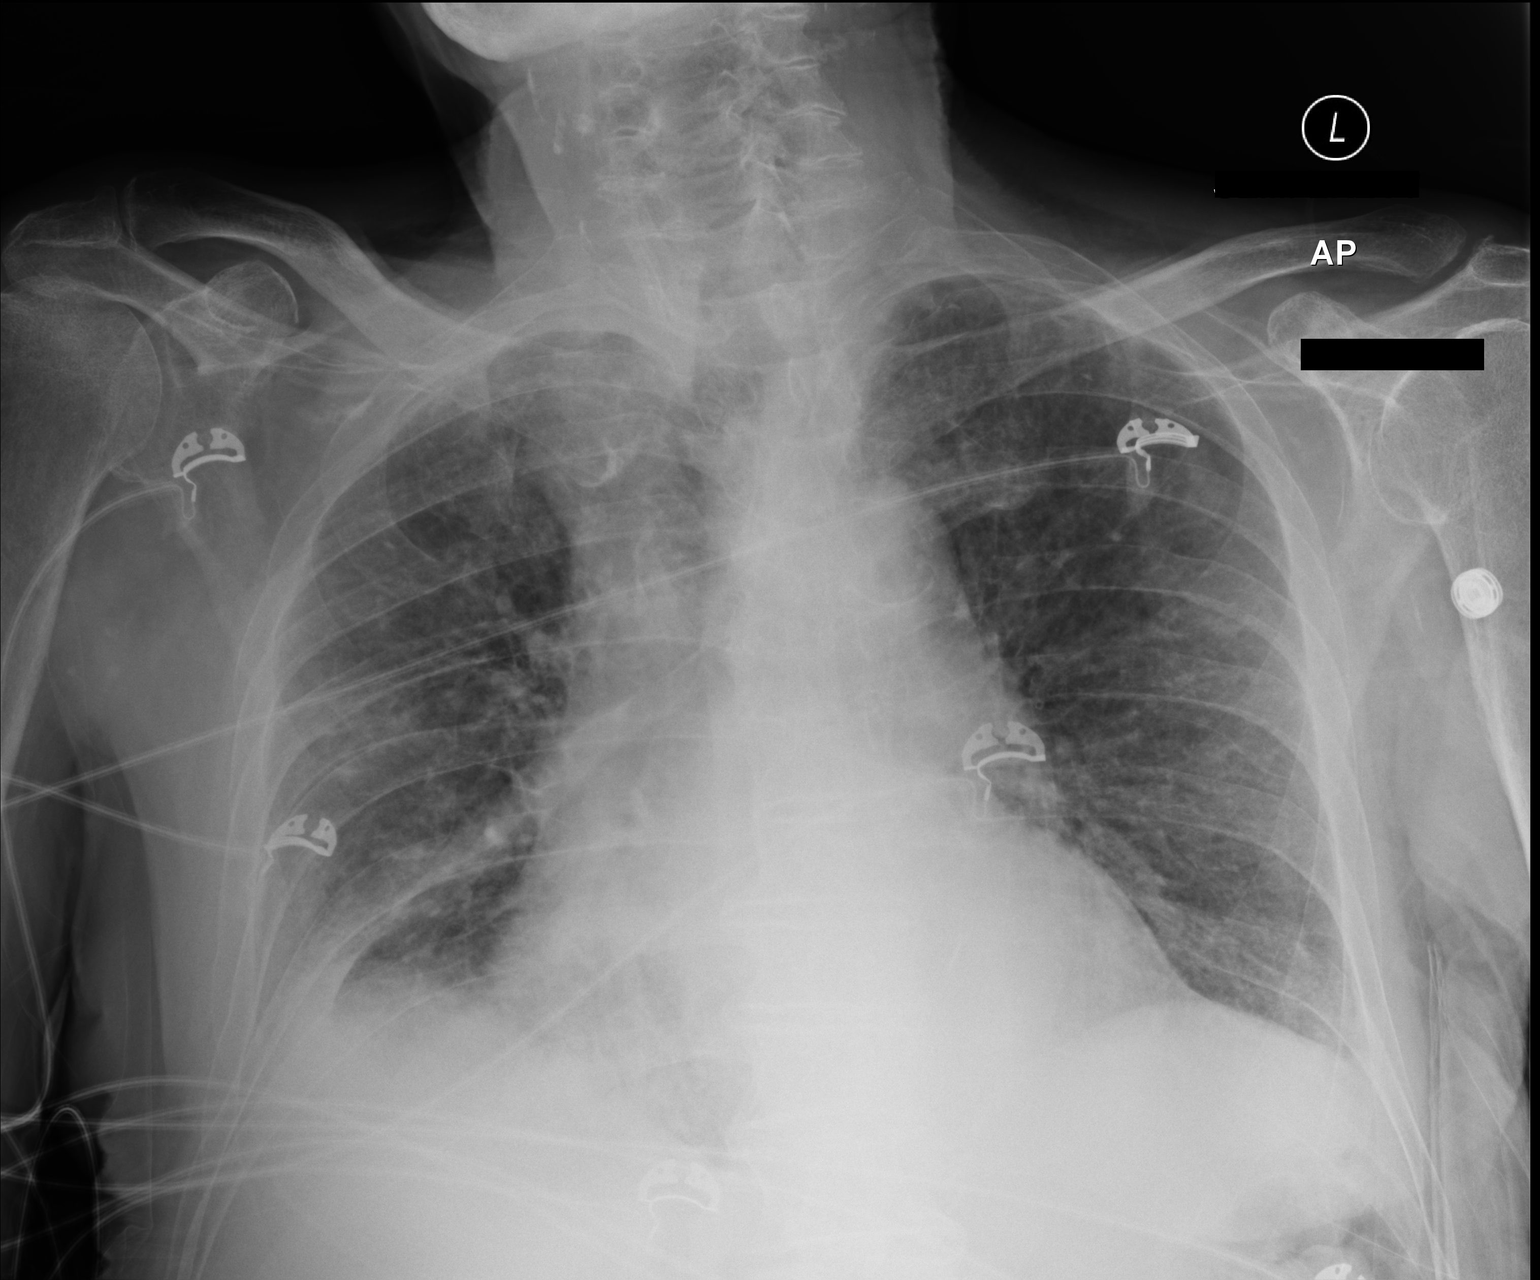

[1 of 1 positions shown; findings below may reference images not displayed]

FINDINGS: There is increased opacity at the right lung base when compared the
prior study with decreased opacity in the right upper lobe. There
persistent thickened interstitial markings bilaterally. Cardiac
silhouette remains mildly enlarged. No pneumothorax.
IMPRESSION: 1. Increased facets right lung base when compared the prior study.
This may be due to atelectasis, pneumonia or a combination.
2. Improved opacity previously noted in the right upper lobe.
3. Persistent mild thickening of the interstitial markings without
overt pulmonary edema.
4. Mild persistent cardiomegaly.

## 2017-03-29 IMAGING — CT CT HEAD W/O CM
2 series · 16 of 30 positions shown, 20 images · non-contrast
Comparison: 02/04/2015

CLINICAL DATA: Pt not able to communicate unable to straighten
headThis morning he was not easily arousable and so he was sent over
to the hospital for evaluation. Patient is unable to provide any
history due to his encephalopathy.

EXAM:
CT HEAD WITHOUT CONTRAST
TECHNIQUE: Contiguous axial images were obtained from the base of the skull
through the vertex without intravenous contrast.

[Series 2: head 5.0 h31s · axial · 0.49mm/px · z∈[-157,-27]mm · 13 of 32 slices shown, 17 images]
[im 3/32  brain]
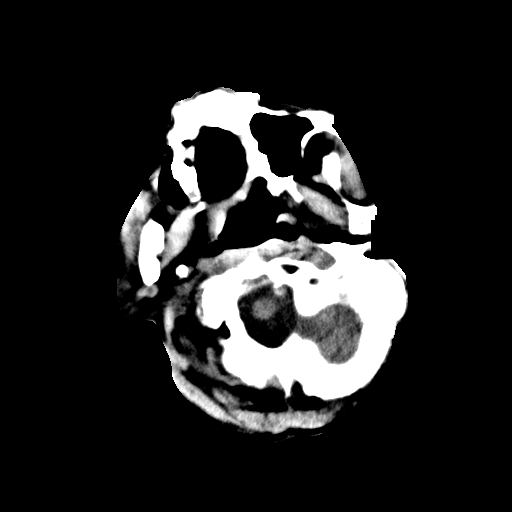
[im 3/32  bone]
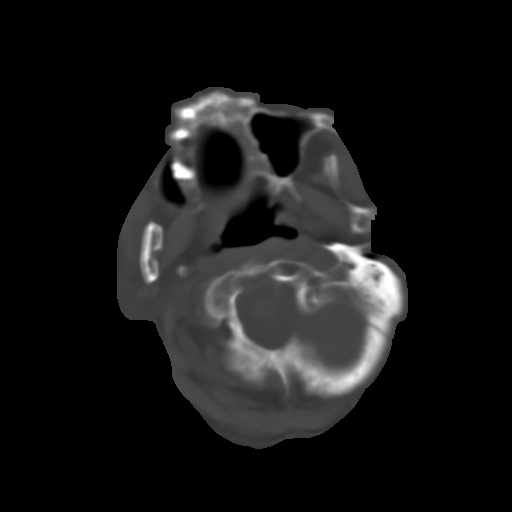
[im 5/32  brain]
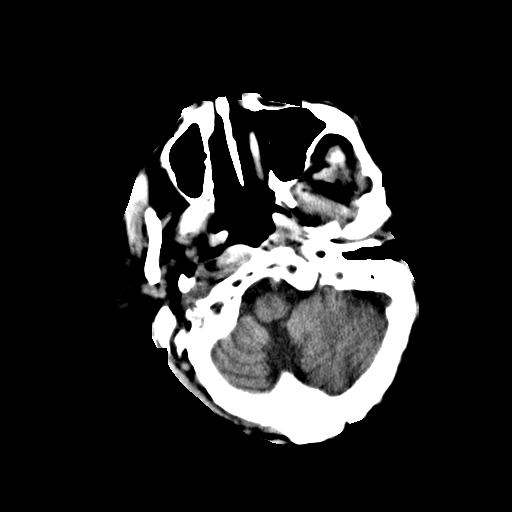
[im 7/32  brain]
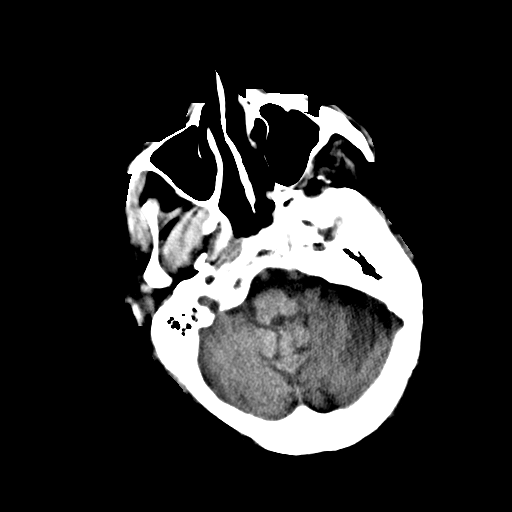
[im 9/32  brain]
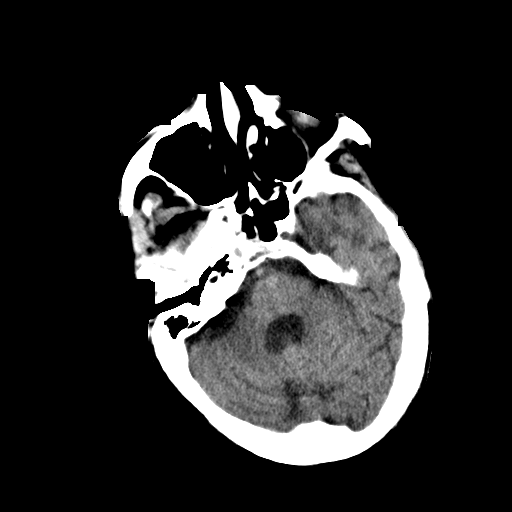
[im 12/32  brain]
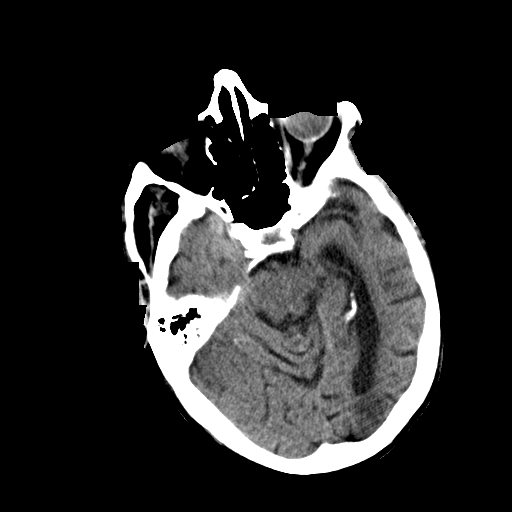
[im 12/32  bone]
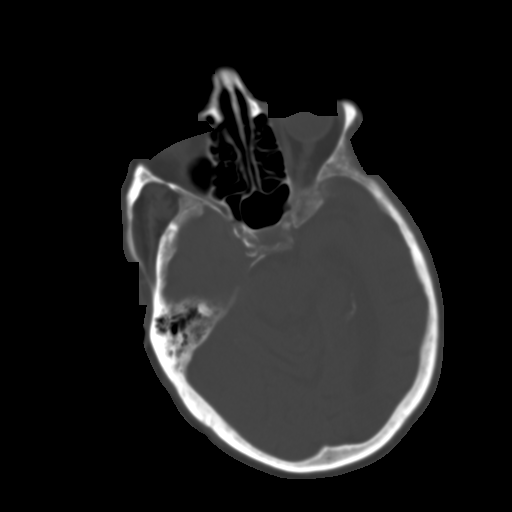
[im 14/32  brain]
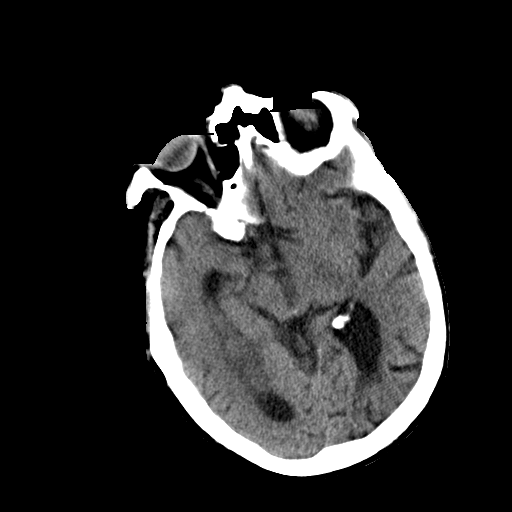
[im 16/32  brain]
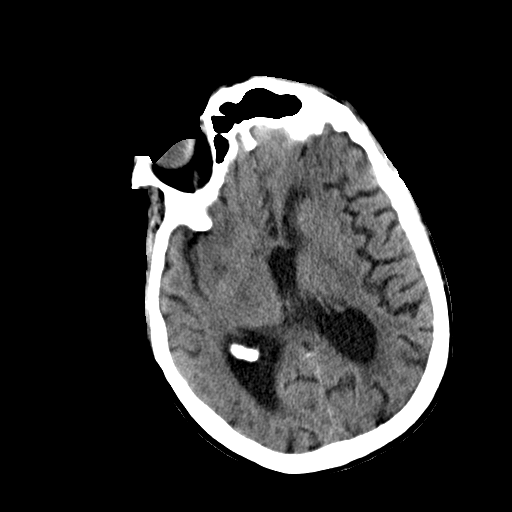
[im 18/32  brain]
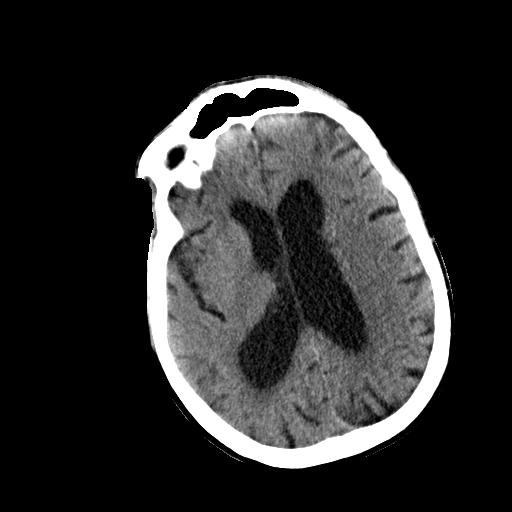
[im 20/32  brain]
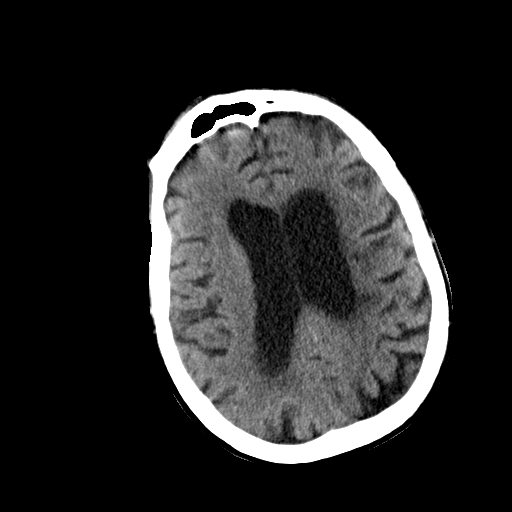
[im 20/32  bone]
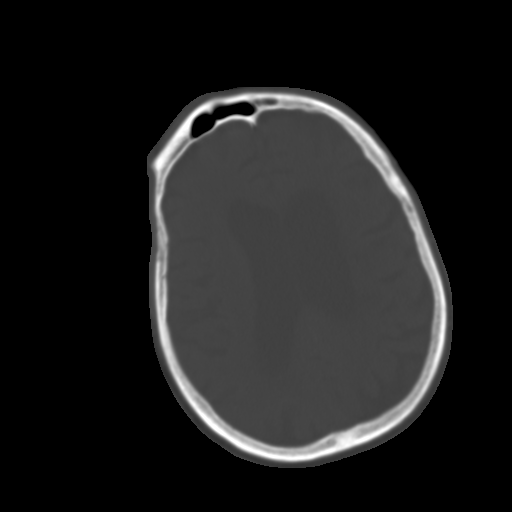
[im 23/32  brain]
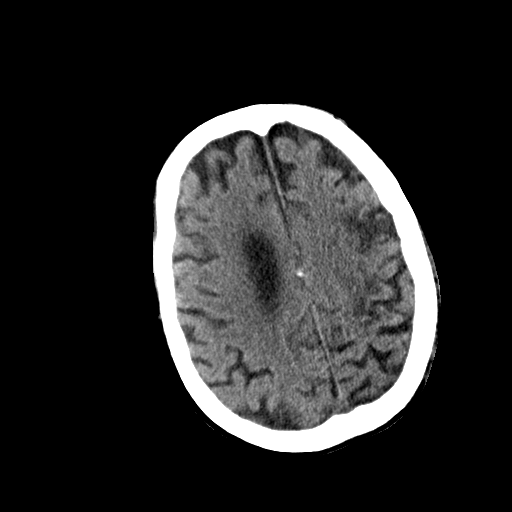
[im 25/32  brain]
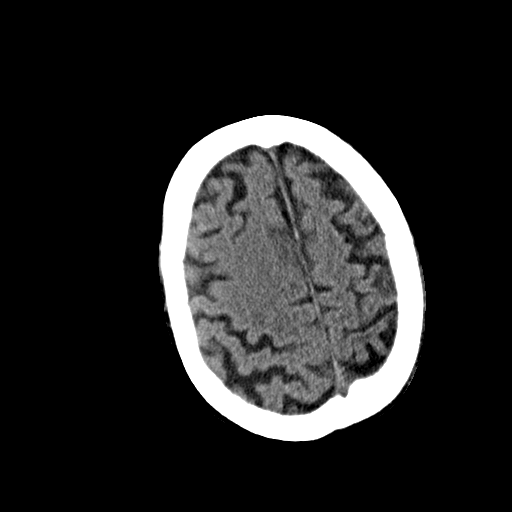
[im 27/32  brain]
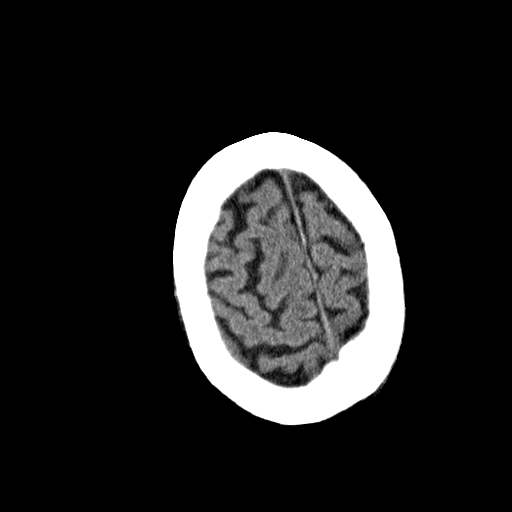
[im 29/32  brain]
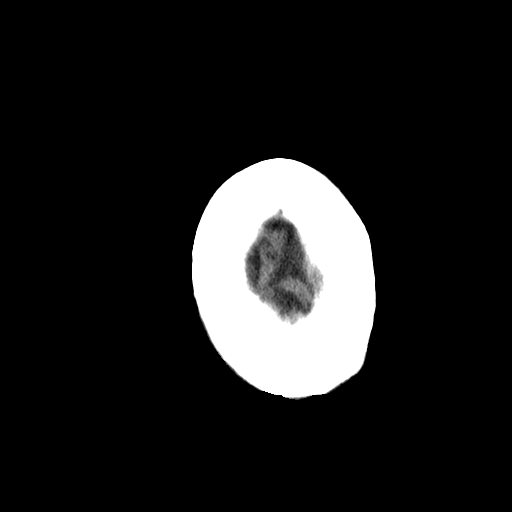
[im 29/32  bone]
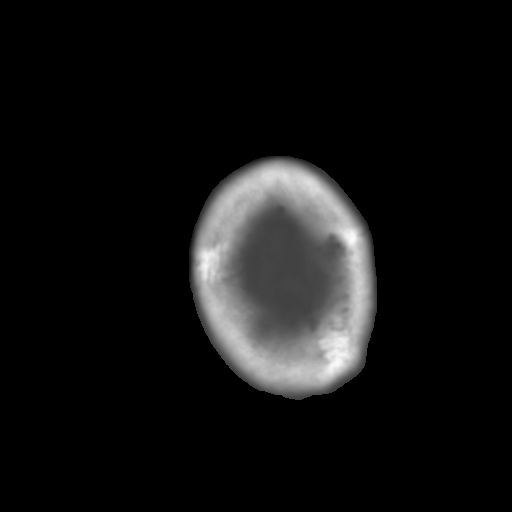

[Series 602: <mpr collection> · axial · 0.52mm/px · z∈[-123,-79]mm · 3 of 32 slices shown]
[im 3/32  brain]
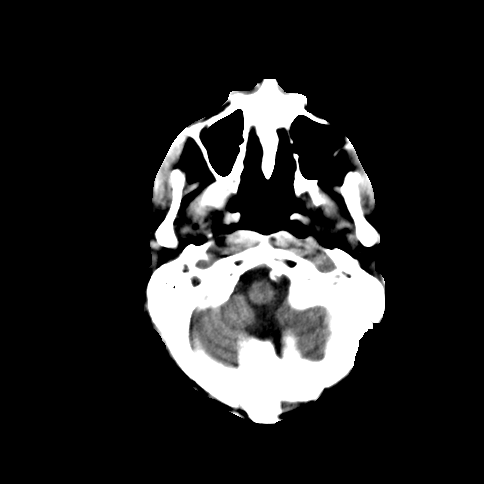
[im 7/32  brain]
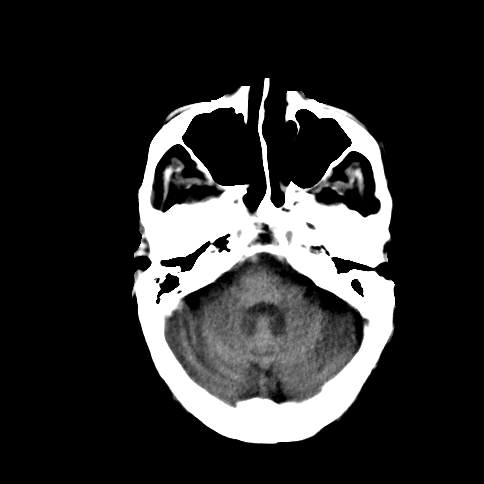
[im 12/32  brain]
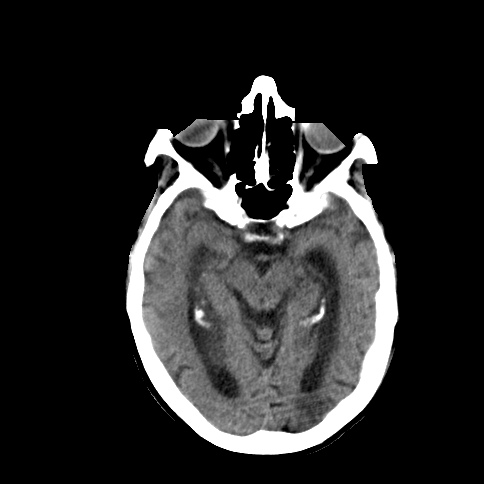

[16 of 30 positions shown; findings below may reference images not displayed]

FINDINGS: Ventricles normal configuration. There is ventricular and sulcal
enlargement reflecting moderate atrophy. No hydrocephalus.

Smaller area of encephalomalacia in the left occipital lobe. There
is hypoattenuation and small areas of encephalomalacia in the left
posterior frontal lobe adjacent parietal lobe. These findings are
stable reflecting old infarcts.

No parenchymal masses or mass effect. There is no evidence of a
recent transcortical infarct.

There are no extra-axial masses or abnormal fluid collections.

No intracranial hemorrhage.

Visualized sinuses and mastoid air cells are clear.
IMPRESSION: 1. No acute intracranial abnormalities.
2. No change from the prior exam.  Atrophy and old infarcts.
